# Patient Record
Sex: Male | Born: 1945 | Hispanic: Yes | Marital: Married | Smoking: Former smoker
Health system: Southern US, Community
[De-identification: ages and names within clinical notes are randomized; demographics above are authoritative.]

## PROBLEM LIST (undated history)

## (undated) DIAGNOSIS — M791 Myalgia, unspecified site: Secondary | ICD-10-CM

## (undated) DIAGNOSIS — U071 COVID-19: Secondary | ICD-10-CM

## (undated) DIAGNOSIS — I739 Peripheral vascular disease, unspecified: Secondary | ICD-10-CM

## (undated) DIAGNOSIS — R112 Nausea with vomiting, unspecified: Secondary | ICD-10-CM

## (undated) DIAGNOSIS — E782 Mixed hyperlipidemia: Secondary | ICD-10-CM

## (undated) DIAGNOSIS — D126 Benign neoplasm of colon, unspecified: Secondary | ICD-10-CM

## (undated) DIAGNOSIS — T7840XA Allergy, unspecified, initial encounter: Secondary | ICD-10-CM

## (undated) DIAGNOSIS — I10 Essential (primary) hypertension: Secondary | ICD-10-CM

## (undated) DIAGNOSIS — Z9289 Personal history of other medical treatment: Secondary | ICD-10-CM

## (undated) DIAGNOSIS — R252 Cramp and spasm: Secondary | ICD-10-CM

## (undated) DIAGNOSIS — M79669 Pain in unspecified lower leg: Secondary | ICD-10-CM

## (undated) DIAGNOSIS — R0600 Dyspnea, unspecified: Secondary | ICD-10-CM

## (undated) DIAGNOSIS — J189 Pneumonia, unspecified organism: Secondary | ICD-10-CM

## (undated) DIAGNOSIS — Z9889 Other specified postprocedural states: Secondary | ICD-10-CM

## (undated) DIAGNOSIS — J449 Chronic obstructive pulmonary disease, unspecified: Secondary | ICD-10-CM

## (undated) DIAGNOSIS — I2510 Atherosclerotic heart disease of native coronary artery without angina pectoris: Secondary | ICD-10-CM

## (undated) DIAGNOSIS — K648 Other hemorrhoids: Secondary | ICD-10-CM

## (undated) DIAGNOSIS — K5190 Ulcerative colitis, unspecified, without complications: Secondary | ICD-10-CM

## (undated) DIAGNOSIS — E876 Hypokalemia: Secondary | ICD-10-CM

## (undated) HISTORY — DX: Myalgia, unspecified site: M79.10

## (undated) HISTORY — DX: Allergy, unspecified, initial encounter: T78.40XA

## (undated) HISTORY — DX: Peripheral vascular disease, unspecified: I73.9

## (undated) HISTORY — DX: Hypokalemia: E87.6

## (undated) HISTORY — PX: MOHS SURGERY: SUR867

## (undated) HISTORY — DX: COVID-19: U07.1

## (undated) HISTORY — PX: OTHER SURGICAL HISTORY: SHX169

## (undated) HISTORY — DX: Ulcerative colitis, unspecified, without complications: K51.90

## (undated) HISTORY — DX: Other hemorrhoids: K64.8

## (undated) HISTORY — PX: POLYPECTOMY: SHX149

## (undated) HISTORY — DX: Chronic obstructive pulmonary disease, unspecified: J44.9

## (undated) HISTORY — DX: Pain in unspecified lower leg: M79.669

## (undated) HISTORY — DX: Cramp and spasm: R25.2

## (undated) HISTORY — PX: COLONOSCOPY: SHX5424

## (undated) HISTORY — PX: CATARACT EXTRACTION: SUR2

## (undated) HISTORY — DX: Benign neoplasm of colon, unspecified: D12.6

---
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
## 1986-02-10 HISTORY — PX: HEMORRHOID SURGERY: SHX153

## 2005-02-10 HISTORY — PX: ROTATOR CUFF REPAIR: SHX139

## 2005-12-29 ENCOUNTER — Ambulatory Visit: Payer: Self-pay | Admitting: Orthopaedic Surgery

## 2006-01-14 ENCOUNTER — Ambulatory Visit: Payer: Self-pay | Admitting: Orthopaedic Surgery

## 2006-08-19 ENCOUNTER — Ambulatory Visit: Payer: Self-pay | Admitting: Gastroenterology

## 2007-02-15 ENCOUNTER — Ambulatory Visit: Payer: Self-pay | Admitting: Vascular Surgery

## 2007-06-03 ENCOUNTER — Ambulatory Visit: Payer: Self-pay | Admitting: Family Medicine

## 2008-07-17 ENCOUNTER — Ambulatory Visit: Payer: Self-pay | Admitting: Family Medicine

## 2009-12-25 ENCOUNTER — Ambulatory Visit: Payer: Self-pay | Admitting: Gastroenterology

## 2011-10-20 ENCOUNTER — Ambulatory Visit: Payer: Self-pay | Admitting: Vascular Surgery

## 2011-10-20 LAB — CREATININE, SERUM
Creatinine: 0.86 mg/dL (ref 0.60–1.30)
EGFR (African American): >60
EGFR (Non-African Amer.): >60

## 2011-10-20 LAB — BUN: BUN: 10 mg/dL (ref 7–18)

## 2011-12-04 ENCOUNTER — Ambulatory Visit: Payer: Self-pay | Admitting: Family Medicine

## 2012-05-16 ENCOUNTER — Ambulatory Visit: Payer: Self-pay

## 2012-06-16 ENCOUNTER — Ambulatory Visit: Payer: Self-pay | Admitting: Vascular Surgery

## 2012-06-16 LAB — CREATININE, SERUM
Creatinine: 0.98 mg/dL (ref 0.60–1.30)
EGFR (African American): >60
EGFR (Non-African Amer.): >60

## 2012-06-16 LAB — BUN: BUN: 15 mg/dL (ref 7–18)

## 2012-12-23 ENCOUNTER — Encounter: Payer: Self-pay | Admitting: Podiatry

## 2012-12-29 ENCOUNTER — Ambulatory Visit (INDEPENDENT_AMBULATORY_CARE_PROVIDER_SITE_OTHER): Payer: No Typology Code available for payment source | Admitting: Podiatry

## 2012-12-29 ENCOUNTER — Encounter: Payer: Self-pay | Admitting: Podiatry

## 2012-12-29 VITALS — Ht 73.0 in | Wt 145.0 lb

## 2012-12-29 DIAGNOSIS — B351 Tinea unguium: Secondary | ICD-10-CM

## 2012-12-29 DIAGNOSIS — M79609 Pain in unspecified limb: Secondary | ICD-10-CM

## 2012-12-29 DIAGNOSIS — G8929 Other chronic pain: Secondary | ICD-10-CM

## 2012-12-29 NOTE — Progress Notes (Signed)
Antonio [name] presents today with a chief complaint of painful heel left. He states has been going on for quite some time.  Objective: Pulses remain barely palpable to this left lower leg. He had stents placed to the SFA which have failed. He has pain on palpation of this central plantar calcaneal tubercle of the left calcaneus. Severe fat-pad atrophy is resulting in bursitis been in pain about the plantar calcaneus.  Assessment: Bursitis plantar fasciitis left.  Plan: Dispensed silicone plantar fascial cuts.

## 2013-01-05 ENCOUNTER — Ambulatory Visit: Payer: Self-pay | Admitting: Family Medicine

## 2013-09-09 ENCOUNTER — Ambulatory Visit: Payer: Self-pay | Admitting: Gastroenterology

## 2013-10-01 ENCOUNTER — Emergency Department: Payer: Self-pay | Admitting: Emergency Medicine

## 2013-10-01 LAB — CBC
HCT: 48 % (ref 40.0–52.0)
HGB: 16.1 g/dL (ref 13.0–18.0)
MCH: 32 pg (ref 26.0–34.0)
MCHC: 33.6 g/dL (ref 32.0–36.0)
MCV: 95 fL (ref 80–100)
Platelet: 214 10*3/uL (ref 150–440)
RBC: 5.05 10*6/uL (ref 4.40–5.90)
RDW: 12.4 % (ref 11.5–14.5)
WBC: 11.6 10*3/uL — ABNORMAL HIGH (ref 3.8–10.6)

## 2013-10-01 LAB — BASIC METABOLIC PANEL
Anion Gap: 8 (ref 7–16)
BUN: 11 mg/dL (ref 7–18)
CALCIUM: 8.7 mg/dL (ref 8.5–10.1)
CHLORIDE: 107 mmol/L (ref 98–107)
CREATININE: 0.93 mg/dL (ref 0.60–1.30)
Co2: 23 mmol/L (ref 21–32)
Glucose: 118 mg/dL — ABNORMAL HIGH (ref 65–99)
OSMOLALITY: 276 (ref 275–301)
Potassium: 4.2 mmol/L (ref 3.5–5.1)
Sodium: 138 mmol/L (ref 136–145)

## 2013-10-01 LAB — TROPONIN I: Troponin-I: <0.02 ng/mL

## 2013-12-11 HISTORY — PX: MENISCUS REPAIR: SHX5179

## 2014-02-01 ENCOUNTER — Ambulatory Visit: Payer: Self-pay | Admitting: Family Medicine

## 2014-02-13 DIAGNOSIS — J168 Pneumonia due to other specified infectious organisms: Secondary | ICD-10-CM | POA: Diagnosis not present

## 2014-03-06 ENCOUNTER — Ambulatory Visit: Payer: Self-pay | Admitting: Family Medicine

## 2014-03-06 DIAGNOSIS — Z09 Encounter for follow-up examination after completed treatment for conditions other than malignant neoplasm: Secondary | ICD-10-CM | POA: Diagnosis not present

## 2014-03-06 DIAGNOSIS — R918 Other nonspecific abnormal finding of lung field: Secondary | ICD-10-CM | POA: Diagnosis not present

## 2014-03-06 DIAGNOSIS — J9811 Atelectasis: Secondary | ICD-10-CM | POA: Diagnosis not present

## 2014-03-06 DIAGNOSIS — J189 Pneumonia, unspecified organism: Secondary | ICD-10-CM | POA: Diagnosis not present

## 2014-03-30 DIAGNOSIS — K5190 Ulcerative colitis, unspecified, without complications: Secondary | ICD-10-CM | POA: Diagnosis not present

## 2014-04-17 DIAGNOSIS — D171 Benign lipomatous neoplasm of skin and subcutaneous tissue of trunk: Secondary | ICD-10-CM | POA: Diagnosis not present

## 2014-04-17 DIAGNOSIS — L218 Other seborrheic dermatitis: Secondary | ICD-10-CM | POA: Diagnosis not present

## 2014-04-17 DIAGNOSIS — Z1283 Encounter for screening for malignant neoplasm of skin: Secondary | ICD-10-CM | POA: Diagnosis not present

## 2014-04-21 DIAGNOSIS — I70211 Atherosclerosis of native arteries of extremities with intermittent claudication, right leg: Secondary | ICD-10-CM | POA: Diagnosis not present

## 2014-04-21 DIAGNOSIS — I6529 Occlusion and stenosis of unspecified carotid artery: Secondary | ICD-10-CM | POA: Diagnosis not present

## 2014-04-21 DIAGNOSIS — I739 Peripheral vascular disease, unspecified: Secondary | ICD-10-CM | POA: Diagnosis not present

## 2014-04-21 DIAGNOSIS — M7989 Other specified soft tissue disorders: Secondary | ICD-10-CM | POA: Diagnosis not present

## 2014-04-25 ENCOUNTER — Encounter: Payer: Self-pay | Admitting: Pulmonary Disease

## 2014-04-25 ENCOUNTER — Ambulatory Visit (INDEPENDENT_AMBULATORY_CARE_PROVIDER_SITE_OTHER): Payer: Medicare Other | Admitting: Pulmonary Disease

## 2014-04-25 VITALS — BP 124/66 | HR 78 | Ht 72.0 in | Wt 150.0 lb

## 2014-04-25 DIAGNOSIS — Z87891 Personal history of nicotine dependence: Secondary | ICD-10-CM | POA: Insufficient documentation

## 2014-04-25 DIAGNOSIS — J441 Chronic obstructive pulmonary disease with (acute) exacerbation: Secondary | ICD-10-CM | POA: Diagnosis not present

## 2014-04-25 DIAGNOSIS — Z720 Tobacco use: Secondary | ICD-10-CM | POA: Diagnosis not present

## 2014-04-25 DIAGNOSIS — J449 Chronic obstructive pulmonary disease, unspecified: Secondary | ICD-10-CM | POA: Diagnosis not present

## 2014-04-25 HISTORY — DX: Tobacco use: Z72.0

## 2014-04-25 MED ORDER — AEROCHAMBER MV MISC: Status: DC

## 2014-04-25 NOTE — Progress Notes (Signed)
Subjective:    Patient ID: Antonio [name], male    DOB: 1945-02-17, 69 y.o.   MRN: [id number]  HPI Chief Complaint  Patient presents with  . Advice Only    self-referral for copd.  Was dx'ed approx 3 yrs ago by pcp.       This is a very pleasant 69 year old male with acute in origin who grew up in [place] who comes to my clinic today or further evaluation of COPD. He is a self-referral. He says that he believes he was diagnosed with COPD approximately 1-1/2 years ago after he had a very severe episode of bronchitis. He was not hospitalized for this but it did require significant outpatient treatment. At that time he quit smoking after some having smoked at least 1/2-1 pack of cigarettes daily for 35 years. He notes that he also used to smoke cigars while playing golf on a regular basis but he does not do that routinely since his diagnosis of COPD. He has never had lung function testing.  He states that over the last several years he has noted increasing shortness of breath with heavy exertion such as walking 18 holes of cough. He normally notices the shortness of breath when he climbs a a steep hill. He says that routine activities such as going to the grocery store, carrying in groceries, or climbing one flight of stairs does not make him short of breath. He does not cough on a routine basis. He does not have significant mucus production.  He says that he uses Symbicort "when he feels he needs it" and albuterol anywhere between 0 and 3 times a day, depending on how he feels. He has noted over the last several years that he will sometimes become short of breath suddenly without any explanation previously. He says he has wondered if this is some sort of environmental allergen. He has not been able to pinpoint the cause. He denies concomitant symptoms such as itchy eyes, scratchy throat. He has never had seasonal allergic rhinitis to his knowledge.   Past Medical History  Diagnosis  Date  . Muscle pain   . Muscle cramp   . Calf pain   . COPD (chronic obstructive pulmonary disease)   . Ulcerative colitis      Family History  Problem Relation Age of Onset  . Cancer Mother     leukemia  . Heart disease Father      History   Social History  . Marital Status: Married    Spouse Name: N/A  . Number of Children: N/A  . Years of Education: N/A   Occupational History  . Not on file.   Social History Main Topics  . Smoking status: Former Smoker -- 0.75 packs/day for 35 years    Types: Cigarettes, Cigars    Quit date: 06/11/2011  . Smokeless tobacco: Never Used     Comment: off and on smoker   . Alcohol Use: 0.0 oz/week    0 Standard drinks or equivalent per week  . Drug Use: No  . Sexual Activity: Not on file   Other Topics Concern  . Not on file   Social History Narrative     Allergies  Allergen Reactions  . Penicillins Rash     Outpatient Prescriptions Prior to Visit  Medication Sig Dispense Refill  . clopidogrel (PLAVIX) 75 MG tablet Take 75 mg by mouth daily with breakfast.     No facility-administered medications prior to visit.  Review of Systems  Constitutional: Negative for fever and unexpected weight change.  HENT: Positive for congestion and sore throat. Negative for dental problem, ear pain, nosebleeds, postnasal drip, rhinorrhea, sinus pressure, sneezing and trouble swallowing.   Eyes: Negative for redness and itching.  Respiratory: Positive for cough and shortness of breath. Negative for chest tightness and wheezing.   Cardiovascular: Negative for palpitations and leg swelling.  Gastrointestinal: Negative for nausea and vomiting.  Genitourinary: Negative for dysuria.  Musculoskeletal: Negative for joint swelling.  Skin: Negative for rash.  Neurological: Negative for headaches.  Hematological: Does not bruise/bleed easily.  Psychiatric/Behavioral: Negative for dysphoric mood. The patient is not nervous/anxious.         Objective:   Physical Exam Filed Vitals:   04/25/14 0912  BP: 124/66  Pulse: 78  Height: 6' (1.829 m)  Weight: 150 lb (68.04 kg)  SpO2: 96%   Ambulated 500 feet on room air and his O2 saturation remained above 95%  Gen: well appearing, no acute distress HEENT: NCAT, PERRL, EOMi, OP clear, neck supple without masses PULM: Few coarse crackles throughout, good air movement, no wheezing CV: RRR, no mgr, no JVD AB: BS+, soft, nontender, no hsm Ext: warm, no edema, no clubbing, no cyanosis Derm: no rash or skin breakdown Neuro: A&Ox4, CN II-XII intact, strength 5/5 in all 4 extremities  04/2014 CXR images reviewed in clinic> emphysema note, no infiltrate, normal cardiac sillhouette      Assessment & Plan:   Centrilobular Emphysema COPD: I am not sure what Gold grade he is at this point because he has not had lung function testing. He clearly has emphysema on his chest x-ray and his symptoms are consistent with COPD but he needs to have a pulmonary function test in order to confirm this.  Because he notes intermittent increasing shortness of breath and chest tightness sometimes when he is outside I wonder if he does not have asthmatic features. We will check pulmonary function testing with pre-and postbronchodilator testing.   -O2 therapy: Not indicated -Immunizations: All UTD -Tobacco use: Quit 2014 -Exercise: Encouraged regular walking 25 min daily -Bronchodilator therapy: Educated on the importance of regular Symbicort use, provided spacer with technique and reviewed HFA technique at length -Exacerbation prevention: Use Symbicort regularly    Tobacco abuse Because of his extensive smoking history he is at increased risk for lung cancer. I congratulated him on quitting over a year and a half ago and I told him today that he really needs to make every effort to never return to tobacco use.  We discussed the risks and benefits of lung cancer screening and I advised him to go  home and learn more about it. On the next visit we will talk about lung cancer screening (high false positive rate, etc.) and we'll determine at that point if he is willing to proceed.    Updated Medication List Outpatient Encounter Prescriptions as of 04/25/2014  Medication Sig  . albuterol (PROVENTIL HFA;VENTOLIN HFA) 108 (90 BASE) MCG/ACT inhaler Inhale 2 puffs into the lungs every 6 (six) hours as needed for wheezing or shortness of breath.  . Ascorbic Acid (VITAMIN C) 100 MG tablet Take 100 mg by mouth daily.  . budesonide-formoterol (SYMBICORT) 80-4.5 MCG/ACT inhaler Inhale 2 puffs into the lungs 2 (two) times daily as needed.  . mesalamine (LIALDA) 1.2 G EC tablet Take 1.2 g by mouth 3 (three) times daily.  . Multiple Vitamin (MULTIVITAMIN WITH MINERALS) TABS tablet Take 1 tablet by mouth daily.  Marland Kitchen  POTASSIUM & SODIUM PHOSPHATES PO Take 1 tablet by mouth daily.  . vitamin E 400 UNIT capsule Take 400 Units by mouth daily.  Marland Kitchen Spacer/Aero-Holding Chambers (AEROCHAMBER MV) inhaler Use as instructed  . [DISCONTINUED] clopidogrel (PLAVIX) 75 MG tablet Take 75 mg by mouth daily with breakfast.

## 2014-04-25 NOTE — Assessment & Plan Note (Signed)
Because of his extensive smoking history he is at increased risk for lung cancer. I congratulated him on quitting over a year and a half ago and I told him today that he really needs to make every effort to never return to tobacco use.  We discussed the risks and benefits of lung cancer screening and I advised him to go home and learn more about it. On the next visit we will talk about lung cancer screening (high false positive rate, etc.) and we'll determine at that point if he is willing to proceed.

## 2014-04-25 NOTE — Patient Instructions (Signed)
Take Symbicort 2 puffs twice a day no matter how you feel, use it with a spacer Exercise regularly Think about Low Dose CT Lung Cancer Screening, we'll talk about this more on the next visit. We will see you back in 6-8 weeks.

## 2014-04-25 NOTE — Assessment & Plan Note (Signed)
COPD: I am not sure what Gold grade he is at this point because he has not had lung function testing. He clearly has emphysema on his chest x-ray and his symptoms are consistent with COPD but he needs to have a pulmonary function test in order to confirm this.  Because he notes intermittent increasing shortness of breath and chest tightness sometimes when he is outside I wonder if he does not have asthmatic features. We will check pulmonary function testing with pre-and postbronchodilator testing.   -O2 therapy: Not indicated -Immunizations: All UTD -Tobacco use: Quit 2014 -Exercise: Encouraged regular walking 25 min daily -Bronchodilator therapy: Educated on the importance of regular Symbicort use, provided spacer with technique and reviewed HFA technique at length -Exacerbation prevention: Use Symbicort regularly

## 2014-05-30 NOTE — Op Note (Signed)
PATIENT NAME:  Antonio [name], Antonio [name] MR#:  [id number] DATE OF BIRTH:  06-11-1945  DATE OF PROCEDURE:  10/20/2011  PREOPERATIVE DIAGNOSES:  1. Peripheral arterial disease with claudication, left lower extremity.  2. Right popliteal artery aneurysm.  3. Long-standing tobacco use.   POSTOPERATIVE DIAGNOSES:  1. Peripheral arterial disease with claudication, left lower extremity.  2. Right popliteal artery aneurysm.  3. Long-standing tobacco use.   PROCEDURES: 1. Ultrasound guidance for vascular access, right femoral artery.  2. Catheter placement into left below-knee popliteal artery from right femoral approach.  3. Aortogram with selective left lower extremity angiogram.  4. Percutaneous transluminal angioplasty of long segment SFA and popliteal occlusion with 5 and 6 mm diameter angioplasty balloon.  5. Self-expanding stent placement x2 to the above-knee popliteal artery and superficial femoral artery for greater than 50% residual stenosis after angioplasty.  6. Mechanical rheolytic thrombectomy to the proximal SFA for residual thrombosis after previous interventions.  7. Percutaneous transluminal angioplasty with 7 mm diameter angioplasty balloon to the proximal SFA.   SURGEON: Algernon Huxley, MD  ANESTHESIA: Local with moderate conscious sedation.   ESTIMATED BLOOD LOSS: Approximately 25 mL.   FLUOROSCOPY TIME: 12 minutes.   CONTRAST USED: 78 mL.   INDICATION FOR PROCEDURE: This is a [place] gentleman with long-standing known peripheral arterial disease. He has had worsening of his symptoms over the past several months and has developed pain and very short distance of walking and even some pain at night worrisome for ischemic rest pain. For these reasons we discussed options. He now desired intervention to the severe lifestyle limiting nature of his symptoms. Risks and benefits were discussed and he elected to proceed.   DESCRIPTION OF PROCEDURE: The patient is brought to the vascular  interventional radiology suite. Groins shaved and prepped, sterile surgical field was created. Ultrasound was used to access the right femoral artery. This was done under direct ultrasound guidance without difficulty with a Seldinger needle and a J-wire and 5 French sheath were placed. Pigtail catheter was placed in the aorta. AP aortogram is performed. This showed no flow-limiting stenosis in the aortoiliac segments with good renal artery perfusion bilaterally. I then hooked the aortic bifurcation, advanced to the left femoral head and selective left lower extremity angiogram was then performed. This demonstrated a proximal occlusion at the SFA just at its origin. This reconstituted just above the knee in the popliteal artery. The patient was systemically heparinized. A 6 French Ansel sheath was placed over a Terumo advantage wire. I was able to gain access to the occluded superficial femoral artery with minimal difficulty with a Kumpe catheter and a Terumo advantage wire. I was able to cross the occlusion and gain intraluminal flow and confirm this in the below-knee popliteal artery and there was good runoff distally with three vessels. I then replaced the Terumo advantage wire, used a 5 mm diameter angioplasty balloon and angioplastied from just above the knee to the common femoral artery. There is still residual stenosis and occlusion, particularly proximally and I used a 6 mm diameter angioplasty balloon from the same locations. Following this there was still areas of essentially no flow throughout. I placed a self-expanding stent using a 6 mm diameter x 20 cm length self-expanding stent starting just above the knee and then a 7 mm diameter x 27 cm length self-expanding stent that went to the proximal superficial femoral artery approximally 4 to 5 cm below the origin. Following this there was still what appeared to be some  thrombus in the proximal SFA. I used mechanical rheolytic thrombectomy to treat this with  significant improvement in flow through the SFA and the stents and then ballooned this area with a 7 mm diameter angioplasty balloon. There was still some residual stenosis and mild thrombus at the top of the stent within the proximal SFA. This was in a zone that was not safe for placement of the stent for fear of hurting his profunda femoris artery and for this reason, I elected to terminate the procedure and run him on Integrilin overnight to try to maintain patency of the intervention. Sheath was pulled back to the ipsilateral external iliac artery and oblique arteriogram was performed. StarClose closure device was performed with excellent hemostatic result.    The patient tolerated procedure well and was taken to the recovery room in stable condition and he had a palpable pulse in the dorsalis pedis in the foot.  ____________________________ Algernon Huxley, MD jsd:cms D: 10/20/2011 14:22:19 ET T: 10/20/2011 16:12:57 ET JOB#: [id number]  cc: Algernon Huxley, MD, <Dictator>  Algernon Huxley MD ELECTRONICALLY SIGNED 10/27/2011 8:59

## 2014-05-30 NOTE — Discharge Summary (Signed)
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   PROCEDURES PERFORMED DURING THIS ADMISSION:  1. Aortogram with selective left lower extremity angiogram.  2. Percutaneous transluminal angioplasty of the long segment SFA popliteal occlusion with a 5 and 6 mm diameter angioplasty balloon.  3. Self-expanding stent placement x2 to the above-knee popliteal artery and superficial femoral artery for greater than 50% stenosis after angioplasty.  4. Mechanical rheolytic thrombectomy to the proximal SFA for residual thrombosis after previous interventions.  5. Percutaneous transluminal angioplasty with a 7 mm diameter angioplasty balloon to the proximal SFA done by Dr. Leotis Pain on 10/20/2011.   COMPLICATIONS: Extreme pain after procedure.   CONSULTATIONS: None.   HOSPITAL COURSE: Patient was admitted. He underwent his procedure on 74/25/9563 without complications that were major in character. Following his angiogram he was transferred from the postanesthesia care unit to the telemetry floor where vital signs remained stable. He received preoperative and postoperative IV antibiotics as well as IV Integrilin, and required no transfusion during this admission. His right groin access remained clean, dry, and intact. He was distally and neurovascularly intact as well. He is stable for discharge on 10/21/2011.   DISCHARGE MEDICATIONS: Discharge medications are his home medications.   DISPOSITION: The patient is discharged home in stable condition on 10/21/2011.   DISCHARGE INSTRUCTIONS: Will continue weight-bearing to tolerance on his left lower extremity. Patient does have a follow-up appointment with our office in 3 to 4 weeks for a wound check as well as ABIs.   Patient understands to call with any questions or concerns in the  interim.   ____________________________ Lane Hacker, PA-C jmk:cms D: 10/21/2011 07:31:48 ET T: 10/21/2011 14:09:17 ET JOB#: [id number]  cc: Lane Hacker, PA-C, <Dictator>  Lane Hacker PA ELECTRONICALLY SIGNED 10/23/2011 8:35

## 2014-06-02 NOTE — Op Note (Signed)
PATIENT NAME:  Antonio [name], Antonio [name] MR#:  [id number] DATE OF BIRTH:  14-Sep-1945  DATE OF PROCEDURE:  06/16/2012  PREOPERATIVE DIAGNOSES:  1. Peripheral arterial disease with claudication, left lower extremity.  2. Status post previous revascularization with occluded left superficial femoral artery stent.  3. Tobacco dependence.   POSTOPERATIVE DIAGNOSES:  1. Peripheral arterial disease with claudication, left lower extremity.  2. Status post previous revascularization with occluded left superficial femoral artery stent.  3. Tobacco dependence.   PROCEDURE: 1. Catheter placement into left popliteal artery from right femoral approach.  2. Aortogram and selective left lower extremity angiogram.  3. Percutaneous transluminal angioplasty of entire superficial femoral artery and above-knee popliteal artery, 4 and 6 mm diameter angioplasty balloon.  4. Viabahn stent placement to left superficial femoral artery for greater than 50% residual stenosis and dissection after angioplasty and previous stent fracture.  5. StarClose closure device, right femoral artery.   SURGEON: Algernon Huxley, MD  ANESTHESIA: Local with moderate conscious sedation.   ESTIMATED BLOOD LOSS: Minimal.   FLUOROSCOPY TIME: 19 minutes.   CONTRAST USED: 67 mL.   INDICATION FOR PROCEDURE: A 69 year old male with severe peripheral vascular disease. He has claudication of the left lower extremity which has become disabling. He began to have numbness and tingling in his foot worrisome for ischemic rest pain symptoms. He is brought back for an angiogram with possible revascularization. Risks and benefits were discussed. Informed consent was obtained.   DESCRIPTION OF THE PROCEDURE: The patient is brought to the vascular and interventional radiology suite. Groins were shaved and prepped, and a sterile surgical field was created. The right femoral head was localized with fluoroscopy, and the right femoral artery was accessed without  difficulty with a Seldinger needle. J-wire and 5 French sheath were placed. Pigtail catheter was placed in the aorta at the L1-L2 level. AP aortogram was performed. This showed normal renal and mesenteric vessels with normal aorta and iliac vessels without flow-limiting stenosis.  I then hooked the aortic bifurcation and advance to the left femoral head. Selective left lower extremity angiogram was then performed. This showed a near-flush occlusion of the left SFA. This was several centimeters above the previously placed stent. The previously placed stent had a clear stent fracture about 45 cm into its stent and may be the cause of the recurrent occlusion. He then reconstituted the popliteal artery at the level of the knee just below the previously placed stents. He had 2 long stents placed previously. The patient was systemically heparinized with 5000 units of intravenous heparin. A 6 French Ansel sheath was placed over a Terumo Advantage wire. With the Terumo Advantage wire, a Kumpe and then a CXI catheter, I was able to gain access to the SFA, get in and cross the occluded stents and into the native popliteal artery distally, confirming intraluminal flow. This was very tight with significant stored energy, and a smaller balloon was used to initially angioplasty the area of recurrent occlusions. A 4 mm diameter balloon was used, and then a 6 mm balloon was taken from just above the knee up to the origin of the SFA. There were still several areas within the proximal portion of the previously placed stent with greater than 50% residual stenosis in the stent fracture, and I elected to cover this area with a Viabahn stent. We exchanged for an 0.018 wire, and a 6 mm diameter x 25 cm in length Viabahn stent was used throughout much of the SFA. This still began  several centimeters below the origin of the SFA, not to encroach upon the profunda femoris artery. Completion angiogram showed markedly improved flow with  maintained distal runoff with good tibial vessels. At this point, I elected to terminate the procedure. The sheath was removed, and a StarClose closure device was deployed in the usual fashion with excellent hemostatic result. The patient tolerated the procedure well and was taken to the recovery room in stable condition.    ____________________________ Algernon Huxley, MD jsd:OSi D: 06/16/2012 12:16:04 ET T: 06/16/2012 12:34:14 ET JOB#: [id number]  cc: Algernon Huxley, MD, <Dictator> Algernon Huxley MD ELECTRONICALLY SIGNED 06/28/2012 13:48

## 2014-06-07 ENCOUNTER — Other Ambulatory Visit: Payer: Self-pay | Admitting: Pulmonary Disease

## 2014-06-07 ENCOUNTER — Ambulatory Visit (INDEPENDENT_AMBULATORY_CARE_PROVIDER_SITE_OTHER): Payer: Medicare Other | Admitting: Pulmonary Disease

## 2014-06-07 DIAGNOSIS — R0600 Dyspnea, unspecified: Secondary | ICD-10-CM

## 2014-06-07 DIAGNOSIS — J441 Chronic obstructive pulmonary disease with (acute) exacerbation: Secondary | ICD-10-CM | POA: Diagnosis not present

## 2014-06-07 LAB — PULMONARY FUNCTION TEST
DL/VA % pred: 55 %
DL/VA: 2.64 ml/min/mmHg/L
DLCO unc % pred: 44 %
DLCO unc: 16.26 ml/min/mmHg
FEF 25-75 Post: 0.69 L/sec
FEF 25-75 Pre: 0.64 L/sec
FEF2575-%Change-Post: 8 %
FEF2575-%Pred-Post: 25 %
FEF2575-%Pred-Pre: 23 %
FEV1-%Change-Post: 1 %
FEV1-%Pred-Post: 40 %
FEV1-%Pred-Pre: 40 %
FEV1-Post: 1.47 L
FEV1-Pre: 1.46 L
FEV1FVC-%Change-Post: -2 %
FEV1FVC-%Pred-Pre: 71 %
FEV6-%Change-Post: 1 %
FEV6-%Pred-Post: 60 %
FEV6-%Pred-Pre: 59 %
FEV6-Post: 2.82 L
FEV6-Pre: 2.77 L
FEV6FVC-%Change-Post: -1 %
FEV6FVC-%Pred-Post: 102 %
FEV6FVC-%Pred-Pre: 103 %
FVC-%Change-Post: 3 %
FVC-%Pred-Post: 59 %
FVC-%Pred-Pre: 57 %
FVC-Post: 2.9 L
FVC-Pre: 2.8 L
Post FEV1/FVC ratio: 51 %
Post FEV6/FVC ratio: 97 %
Pre FEV1/FVC ratio: 52 %
Pre FEV6/FVC Ratio: 99 %

## 2014-06-07 NOTE — Progress Notes (Signed)
PFT performed today. Nitrogen washout performed.

## 2014-06-09 ENCOUNTER — Encounter (INDEPENDENT_AMBULATORY_CARE_PROVIDER_SITE_OTHER): Payer: Self-pay

## 2014-06-09 ENCOUNTER — Encounter: Payer: Self-pay | Admitting: Pulmonary Disease

## 2014-06-09 ENCOUNTER — Ambulatory Visit (INDEPENDENT_AMBULATORY_CARE_PROVIDER_SITE_OTHER): Payer: Medicare Other | Admitting: Pulmonary Disease

## 2014-06-09 VITALS — BP 124/66 | HR 78 | Ht 72.0 in | Wt 148.0 lb

## 2014-06-09 DIAGNOSIS — R197 Diarrhea, unspecified: Secondary | ICD-10-CM | POA: Insufficient documentation

## 2014-06-09 DIAGNOSIS — J449 Chronic obstructive pulmonary disease, unspecified: Secondary | ICD-10-CM | POA: Diagnosis not present

## 2014-06-09 DIAGNOSIS — Z720 Tobacco use: Secondary | ICD-10-CM | POA: Diagnosis not present

## 2014-06-09 MED ORDER — BUDESONIDE-FORMOTEROL FUMARATE 80-4.5 MCG/ACT IN AERO
2.0000 | INHALATION_SPRAY | Freq: Two times a day (BID) | RESPIRATORY_TRACT | Status: DC | PRN
Start: 2014-06-09 — End: 2014-11-08

## 2014-06-09 NOTE — Assessment & Plan Note (Signed)
He says he has been diagnosed with colitis, but he is not certain exactly what kind. He has been struggling with worse diarrhea recently. He would like to be referred to a gastroenterologist. I will refer him to Dr. Zenovia Jarred with [facility] gastroenterology.

## 2014-06-09 NOTE — Progress Notes (Signed)
Subjective:    Patient ID: Antonio [name], male    DOB: [birth year], 69 y.o.   MRN: [id number]  Synopsis: He was referred for shortness of breath in 2016. He had a lengthy smoking history but quit smoking in 2014. April 2016 pulmonary function testing confirmed COPD which was severe with an FEV1 of 1.47 L (40% predicted).  HPI Chief Complaint  Patient presents with  . Follow-up    Pt states symbicort has helped.  c/o sob with lots of exertion.  Review PFT from 4/27.   Antonio [name] says that his symbicort is helping because he is not relying on the ventolin nearly as much anymore.  He continues to remain active with his golf on a regular basis. He is not coughing.  Diarrhea has been more of a problem for him recently. He is having to go to the bathroom 3-4 times a day with loose stools. He says he has been diagnosed with "colitis" in the past but is not certain what kind. He would like to be referred to a gastroenterologist.  Past Medical History  Diagnosis Date  . Muscle pain   . Muscle cramp   . Calf pain   . COPD (chronic obstructive pulmonary disease)   . Ulcerative colitis       Review of Systems     Objective:   Physical Exam  Filed Vitals:   06/09/14 1402  BP: 124/66  Pulse: 78  Height: 6' (1.829 m)  Weight: 148 lb (67.132 kg)  SpO2: 95%   RA  Gen: well appearing HENT: OP clear, TM's clear, neck supple PULM: CTA B, normal effort CV: RRR, no mgr, trace edema GI: BS+, soft, nontender Derm: no cyanosis or rash Psyche: normal mood and affect  PFTs from this week reviewed showing severe airflow obstruction     Assessment & Plan:   Centrilobular Emphysema His lung function testing this week showed severe COPD. Despite this he remains quite active with playing golf on a regular basis. He is a Gold grade C.  He has benefited from the Symbicort significantly and is using much less Ventolin.  Plan: -Continue Symbicort indefinitely -Continue as needed  albuterol -Flu shot in the fall -Follow-up 6 months   Tobacco abuse Today we discussed his high risk of lung cancer given his prior smoking history. We discussed the risks and benefits of lung cancer screening with a CT scan, he is interested but wants to think about it more before committing. We provided him with literature on lung cancer screening today.   Diarrhea He says he has been diagnosed with colitis, but he is not certain exactly what kind. He has been struggling with worse diarrhea recently. He would like to be referred to a gastroenterologist. I will refer him to Dr. Zenovia Jarred with [facility] gastroenterology.     Updated Medication List Outpatient Encounter Prescriptions as of 06/09/2014  Medication Sig  . albuterol (PROVENTIL HFA;VENTOLIN HFA) 108 (90 BASE) MCG/ACT inhaler Inhale 2 puffs into the lungs every 6 (six) hours as needed for wheezing or shortness of breath.  . Ascorbic Acid (VITAMIN C) 100 MG tablet Take 100 mg by mouth daily.  . budesonide-formoterol (SYMBICORT) 80-4.5 MCG/ACT inhaler Inhale 2 puffs into the lungs 2 (two) times daily as needed.  . mesalamine (LIALDA) 1.2 G EC tablet Take 1.2 g by mouth 3 (three) times daily.  . Multiple Vitamin (MULTIVITAMIN WITH MINERALS) TABS tablet Take 1 tablet by mouth daily.  Marland Kitchen POTASSIUM & SODIUM PHOSPHATES  PO Take 1 tablet by mouth daily.  Marland Kitchen Spacer/Aero-Holding Chambers (AEROCHAMBER MV) inhaler Use as instructed  . SYMBICORT 160-4.5 MCG/ACT inhaler Inhale 1 puff into the lungs daily.  . vitamin E 400 UNIT capsule Take 400 Units by mouth daily.

## 2014-06-09 NOTE — Assessment & Plan Note (Signed)
Today we discussed his high risk of lung cancer given his prior smoking history. We discussed the risks and benefits of lung cancer screening with a CT scan, he is interested but wants to think about it more before committing. We provided him with literature on lung cancer screening today.

## 2014-06-09 NOTE — Assessment & Plan Note (Signed)
His lung function testing this week showed severe COPD. Despite this he remains quite active with playing golf on a regular basis. He is a Gold grade C.  He has benefited from the Symbicort significantly and is using much less Ventolin.  Plan: -Continue Symbicort indefinitely -Continue as needed albuterol -Flu shot in the fall -Follow-up 6 months

## 2014-06-09 NOTE — Patient Instructions (Signed)
We will refer you to Dr. Hilarie Fredrickson with [facility] gastroenterology to evaluate the diarrhea Take Symbicort 2 puffs twice a day to matter how you feel Let me know if you are interested in lung cancer screening Follow-up in 6 months or sooner if needed

## 2014-06-21 ENCOUNTER — Encounter: Payer: Self-pay | Admitting: Internal Medicine

## 2014-07-13 ENCOUNTER — Encounter: Payer: Self-pay | Admitting: Family Medicine

## 2014-07-13 ENCOUNTER — Ambulatory Visit (INDEPENDENT_AMBULATORY_CARE_PROVIDER_SITE_OTHER): Payer: Medicare Other | Admitting: Family Medicine

## 2014-07-13 VITALS — BP 120/78 | HR 80 | Ht 72.0 in | Wt 147.0 lb

## 2014-07-13 DIAGNOSIS — J441 Chronic obstructive pulmonary disease with (acute) exacerbation: Secondary | ICD-10-CM | POA: Diagnosis not present

## 2014-07-13 MED ORDER — PREDNISONE 10 MG PO TABS: 10.0000 mg | ORAL_TABLET | Freq: Every day | ORAL | Status: DC

## 2014-07-13 MED ORDER — GUAIFENESIN-CODEINE 100-10 MG/5ML PO SOLN: 5.0000 mL | Freq: Four times a day (QID) | ORAL | Status: AC | PRN

## 2014-07-13 NOTE — Progress Notes (Signed)
Name: Antonio [name]   MRN: [id number]    DOB: 11/03/1945   Date:07/13/2014       Progress Note  Subjective  Chief Complaint  Chief Complaint  Patient presents with  . Cough    drainage- doesn't bother at night, just during day    Cough This is a recurrent problem. The current episode started in the past 7 days. The problem has been unchanged. The problem occurs constantly. The cough is non-productive. Associated symptoms include nasal congestion, postnasal drip and wheezing. Pertinent negatives include no chills, ear pain, fever, headaches, heartburn, myalgias, sore throat or sweats. The symptoms are aggravated by pollens. Risk factors for lung disease include smoking/tobacco exposure. He has tried steroid inhaler and a beta-agonist inhaler for the symptoms. The treatment provided mild relief. His past medical history is significant for COPD and emphysema.     No problem-specific assessment & plan notes found for this encounter.   Past Medical History  Diagnosis Date  . Muscle pain   . Muscle cramp   . Calf pain   . COPD (chronic obstructive pulmonary disease)   . Ulcerative colitis     Past Surgical History  Procedure Laterality Date  . Meniscus repair Left 12/2013  . Leg stent Left approx 45 yrs ago  . Shoulder surgery Left 2007  . Hemorrhoid surgery  1988  . Tonsillectomy  1957    Family History  Problem Relation Age of Onset  . Cancer Mother     leukemia  . Heart disease Father     History   Social History  . Marital Status: Married    Spouse Name: N/A  . Number of Children: N/A  . Years of Education: N/A   Occupational History  . Not on file.   Social History Main Topics  . Smoking status: Former Smoker -- 0.75 packs/day for 35 years    Types: Cigarettes, Cigars    Quit date: 06/11/2011  . Smokeless tobacco: Never Used     Comment: off and on smoker   . Alcohol Use: 0.0 oz/week    0 Standard drinks or equivalent per week  . Drug Use: No  .  Sexual Activity: Not on file   Other Topics Concern  . Not on file   Social History Narrative     Current outpatient prescriptions:  .  albuterol (PROVENTIL HFA;VENTOLIN HFA) 108 (90 BASE) MCG/ACT inhaler, Inhale 2 puffs into the lungs every 6 (six) hours as needed for wheezing or shortness of breath., Disp: , Rfl:  .  Ascorbic Acid (VITAMIN C) 100 MG tablet, Take 100 mg by mouth daily., Disp: , Rfl:  .  aspirin 81 MG tablet, Take 81 mg by mouth daily., Disp: , Rfl:  .  fexofenadine (ALLEGRA) 180 MG tablet, Take 180 mg by mouth daily., Disp: , Rfl:  .  mesalamine (LIALDA) 1.2 G EC tablet, Take 1.2 g by mouth 3 (three) times daily., Disp: , Rfl:  .  Multiple Vitamin (MULTIVITAMIN WITH MINERALS) TABS tablet, Take 1 tablet by mouth daily., Disp: , Rfl:  .  POTASSIUM & SODIUM PHOSPHATES PO, Take 1 tablet by mouth daily., Disp: , Rfl:  .  Spacer/Aero-Holding Chambers (AEROCHAMBER MV) inhaler, Use as instructed, Disp: 1 each, Rfl: 0 .  SYMBICORT 160-4.5 MCG/ACT inhaler, Inhale 1 puff into the lungs daily., Disp: , Rfl: 11 .  vitamin E 400 UNIT capsule, Take 400 Units by mouth daily., Disp: , Rfl:  .  budesonide-formoterol (SYMBICORT) 80-4.5 MCG/ACT inhaler,  Inhale 2 puffs into the lungs 2 (two) times daily as needed. (Patient not taking: Reported on 07/13/2014), Disp: 3 Inhaler, Rfl: 3 .  guaiFENesin-codeine 100-10 MG/5ML syrup, Take 5 mLs by mouth every 6 (six) hours as needed for cough., Disp: 120 mL, Rfl: 0 .  predniSONE (DELTASONE) 10 MG tablet, Take 1 tablet (10 mg total) by mouth daily with breakfast., Disp: 30 tablet, Rfl: 0  Allergies  Allergen Reactions  . Penicillins Rash     Review of Systems  Constitutional: Negative for fever, chills and diaphoresis.  HENT: Positive for postnasal drip. Negative for ear pain and sore throat.   Respiratory: Positive for cough and wheezing.   Cardiovascular: Negative for palpitations.  Gastrointestinal: Positive for diarrhea. Negative for  heartburn, blood in stool and melena.  Genitourinary: Negative for dysuria and frequency.  Musculoskeletal: Negative for myalgias.  Neurological: Negative for headaches.  Psychiatric/Behavioral: Negative for depression.     Objective  Filed Vitals:   07/13/14 1118  BP: 120/78  Pulse: 80  Height: 6' (1.829 m)  Weight: 147 lb (66.679 kg)  SpO2: 97%    Physical Exam  Constitutional: He is well-developed, well-nourished, and in no distress.  HENT:  Head: Normocephalic.  Right Ear: External ear normal.  Left Ear: External ear normal.  Nose: Nose normal.  Mouth/Throat: Oropharynx is clear and moist.  Eyes: Conjunctivae are normal. Pupils are equal, round, and reactive to light.  Neck: Normal range of motion. Neck supple. No JVD present. No tracheal deviation present. No thyromegaly present.  Pulmonary/Chest: Effort normal. No respiratory distress. He has no wheezes. He has no rales.  Abdominal: Soft. Bowel sounds are normal. There is no tenderness.  Musculoskeletal: Normal range of motion.  Lymphadenopathy:    He has no cervical adenopathy.      Recent Results (from the past 2160 hour(s))  Pulmonary function test     Status: None   Collection Time: 06/07/14  2:58 PM  Result Value Ref Range   FVC-Pre 2.80 L   FVC-%Pred-Pre 57 %   FVC-Post 2.90 L   FVC-%Pred-Post 59 %   FVC-%Change-Post 3 %   FEV1-Pre 1.46 L   FEV1-%Pred-Pre 40 %   FEV1-Post 1.47 L   FEV1-%Pred-Post 40 %   FEV1-%Change-Post 1 %   FEV6-Pre 2.77 L   FEV6-%Pred-Pre 59 %   FEV6-Post 2.82 L   FEV6-%Pred-Post 60 %   FEV6-%Change-Post 1 %   Pre FEV1/FVC ratio 52 %   FEV1FVC-%Pred-Pre 71 %   Post FEV1/FVC ratio 51 %   FEV1FVC-%Change-Post -2 %   Pre FEV6/FVC Ratio 99 %   FEV6FVC-%Pred-Pre 103 %   Post FEV6/FVC ratio 97 %   FEV6FVC-%Pred-Post 102 %   FEV6FVC-%Change-Post -1 %   FEF 25-75 Pre 0.64 L/sec   FEF2575-%Pred-Pre 23 %   FEF 25-75 Post 0.69 L/sec   FEF2575-%Pred-Post 25 %    FEF2575-%Change-Post 8 %   DLCO unc 16.26 ml/min/mmHg   DLCO unc % pred 44 %   DL/VA 2.64 ml/min/mmHg/L   DL/VA % pred 55 %     Assessment & Plan  Problem List Items Addressed This Visit    None    Visit Diagnoses    COPD with exacerbation    -  Primary    Relevant Medications    fexofenadine (ALLEGRA) 180 MG tablet    guaiFENesin-codeine 100-10 MG/5ML syrup    predniSONE (DELTASONE) 10 MG tablet       Meds ordered this encounter  Medications  .  aspirin 81 MG tablet    Sig: Take 81 mg by mouth daily.  . fexofenadine (ALLEGRA) 180 MG tablet    Sig: Take 180 mg by mouth daily.  Marland Kitchen guaiFENesin-codeine 100-10 MG/5ML syrup    Sig: Take 5 mLs by mouth every 6 (six) hours as needed for cough.    Dispense:  120 mL    Refill:  0  . predniSONE (DELTASONE) 10 MG tablet    Sig: Take 1 tablet (10 mg total) by mouth daily with breakfast.    Dispense:  30 tablet    Refill:  0     Dr. Macon [facility]  07/13/2014

## 2014-08-18 ENCOUNTER — Ambulatory Visit (INDEPENDENT_AMBULATORY_CARE_PROVIDER_SITE_OTHER): Payer: Medicare Other | Admitting: Internal Medicine

## 2014-08-18 ENCOUNTER — Encounter: Payer: Self-pay | Admitting: Internal Medicine

## 2014-08-18 VITALS — BP 110/60 | HR 60 | Ht 70.0 in | Wt 149.5 lb

## 2014-08-18 DIAGNOSIS — K5120 Ulcerative (chronic) proctitis without complications: Secondary | ICD-10-CM

## 2014-08-18 DIAGNOSIS — D126 Benign neoplasm of colon, unspecified: Secondary | ICD-10-CM

## 2014-08-18 MED ORDER — MESALAMINE 4 G RE ENEM: 4.0000 g | ENEMA | Freq: Every day | RECTAL | Status: DC

## 2014-08-18 MED ORDER — MESALAMINE 1.2 G PO TBEC: 3.6000 g | DELAYED_RELEASE_TABLET | Freq: Every day | ORAL | Status: DC

## 2014-08-18 NOTE — Patient Instructions (Signed)
We have printed your prescriptions as requested and give them to you   Lialda and Rowasa Your colonoscopy recall will be 08/2016, You will receive a letter from [place]  Follow up in 8 weeks which is scheduled on 09/12/2014 Tuesday at 2pm

## 2014-08-18 NOTE — Progress Notes (Signed)
Spoke with pt and reviewed Dr. Garth Schlatter comments with him. Pt aware.   Please let pt know that I reviewed his records more extensively.    The visible inflammation at colonoscopy was rectal only. However, the polypectomies from the transverse and ascending colon also showed inflammation (even though it was not seen by the eye).    This suggests the colitis involves more than just the rectum.    No change in therapy, continue Lialda and Rowasa (the rowasa was added today)

## 2014-08-18 NOTE — Progress Notes (Signed)
Patient ID: Antonio [name], male   DOB: [birth year], 69 y.o.   MRN: [id number] HPI: Antonio [name] is a 69 yo male with PMH of ulcerative proctitis, adenomatous colon polyps, COPD who seen in evaluation for second opinion regarding ulcerative proctitis. He is here alone today. He was diagnosed with ulcerative proctitis 1 year ago by Dr. Lucilla [name].  He reports he developed loose or more frequent stools which led to further evaluation. He reports for his whole life he was regular having one formed bowel movement a day. This has changed since diagnosis and he has developed more frequent, urgent stools occurring 2-4 times daily. Often worse after eating. Now afraid to go play golf on some days. Monitor blood with wiping but denies blood in the stool or melena. Denies tenesmus. Denies incomplete evacuation. Denies abdominal pain. Does report increased flatulence. Denies upper GI complaint including nausea, vomiting, dysphagia, odynophagia, heartburn, indigestion, early satiety. Reports stable weight. Has family history of GI tract malignancy or IBD. Denies oral ulcers, rash or new joint pains.  Is taking Lialda 3 tablets daily. Reports he was initially treated with Lialda 2 tablets per day which didn't help much. He was put on a prednisone taper for 1 month and this seemed to help. Lialda was then increase to 3 tablets daily and at some point Canasa suppositories were tried. He can't recall whether the increased Lialda or Canasa helped his symptoms. He denies frequent NSAID use.  Records from Dr. Allen [name] proceed in reviewed. Colonoscopy 09/09/2013 -- severe proctitis in the rectum and distal rectum. Biopsies obtained. 3 mm cecal polyp removed. 3 sessile polyps in the ascending colon ranging 4-6 mm in size removed. 4 mm transverse colon polyp removed. Pathology = rectal biopsies -- marked active colitis with architectural features of chronicity. Negative for dysplasia. Ascending colon polyps -- tubular adenoma 2  and polypoid fragment of colonic mucosa with moderate active colitis. Cecal polyp -- early hyperplastic polyp. Transverse colon polyp -- moderate active colitis.  Past Medical History  Diagnosis Date  . Muscle pain   . Muscle cramp   . Calf pain   . COPD (chronic obstructive pulmonary disease)   . Ulcerative colitis   . Colon polyps     Past Surgical History  Procedure Laterality Date  . Meniscus repair Left 12/2013  . Leg stent Left approx 45 yrs ago  . Rotator cuff repair Left 2007  . Hemorrhoid surgery  1988  . Tonsillectomy and adenoidectomy  1957    Outpatient Prescriptions Prior to Visit  Medication Sig Dispense Refill  . albuterol (PROVENTIL HFA;VENTOLIN HFA) 108 (90 BASE) MCG/ACT inhaler Inhale 2 puffs into the lungs every 6 (six) hours as needed for wheezing or shortness of breath.    . Ascorbic Acid (VITAMIN C) 100 MG tablet Take 100 mg by mouth daily.    Marland Kitchen aspirin 81 MG tablet Take 81 mg by mouth daily.    . budesonide-formoterol (SYMBICORT) 80-4.5 MCG/ACT inhaler Inhale 2 puffs into the lungs 2 (two) times daily as needed. 3 Inhaler 3  . fexofenadine (ALLEGRA) 180 MG tablet Take 180 mg by mouth daily.    . Multiple Vitamin (MULTIVITAMIN WITH MINERALS) TABS tablet Take 1 tablet by mouth daily.    Marland Kitchen POTASSIUM & SODIUM PHOSPHATES PO Take 1 tablet by mouth daily.    Marland Kitchen Spacer/Aero-Holding Chambers (AEROCHAMBER MV) inhaler Use as instructed 1 each 0  . SYMBICORT 160-4.5 MCG/ACT inhaler Inhale 1 puff into the lungs daily.  11  .  vitamin E 400 UNIT capsule Take 400 Units by mouth daily.    . mesalamine (LIALDA) 1.2 G EC tablet Take 1.2 g by mouth 3 (three) times daily.    . predniSONE (DELTASONE) 10 MG tablet Take 1 tablet (10 mg total) by mouth daily with breakfast. 30 tablet 0   No facility-administered medications prior to visit.    Allergies  Allergen Reactions  . Penicillins Rash    Family History  Problem Relation Age of Onset  . Cancer Mother     leukemia  .  Heart disease Father     History  Substance Use Topics  . Smoking status: Former Smoker -- 0.75 packs/day for 35 years    Types: Cigarettes, Cigars    Quit date: 06/11/2011  . Smokeless tobacco: Never Used     Comment: off and on smoker   . Alcohol Use: 4.2 oz/week    7 Standard drinks or equivalent per week    ROS: As per history of present illness, otherwise negative  BP 110/60 mmHg  Pulse 60  Ht 5' 10"  (1.778 m)  Wt 149 lb 8 oz (67.813 kg)  BMI 21.45 kg/m2 Constitutional: Well-developed and well-nourished. No distress. HEENT: Normocephalic and atraumatic. Oropharynx is clear and moist. No oropharyngeal exudate. Conjunctivae are normal.  No scleral icterus. Neck: Neck supple. Trachea midline. Cardiovascular: Normal rate, regular rhythm and intact distal pulses. No M/R/G Pulmonary/chest: Effort normal and breath sounds distant. No wheezing, rales or rhonchi. Abdominal: Soft, nontender, nondistended. Bowel sounds active throughout. There are no masses palpable. No hepatosplenomegaly. Extremities: no clubbing, cyanosis, or edema Lymphadenopathy: No cervical adenopathy noted. Neurological: Alert and oriented to person place and time. Skin: Skin is warm and dry. No rashes noted. Psychiatric: Normal mood and affect. Behavior is normal.  RELEVANT LABS AND IMAGING: CBC    Component Value Date/Time   WBC 11.6* 10/01/2013 2131   RBC 5.05 10/01/2013 2131   HGB 16.1 10/01/2013 2131   HCT 48.0 10/01/2013 2131   PLT 214 10/01/2013 2131   MCV 95 10/01/2013 2131   MCH 32.0 10/01/2013 2131   MCHC 33.6 10/01/2013 2131   RDW 12.4 10/01/2013 2131    CMP     Component Value Date/Time   NA 138 10/01/2013 2131   K 4.2 10/01/2013 2131   CL 107 10/01/2013 2131   CO2 23 10/01/2013 2131   GLUCOSE 118* 10/01/2013 2131   BUN 11 10/01/2013 2131   CREATININE 0.93 10/01/2013 2131   CALCIUM 8.7 10/01/2013 2131   GFRNONAA >60 10/01/2013 2131   GFRAA >60 10/01/2013 2131     ASSESSMENT/PLAN:  69 yo male with PMH of ulcerative proctitis, adenomatous colon polyps, COPD who seen in evaluation for second opinion regarding ulcerative colitis/proctitis.  1. UC versus ulcerative proctitis -- the patient still has symptoms of active: Inflammation manifested by more frequent and loose stools. He has not responded completely to Lialda. Endoscopically the most intense inflammation was in the rectum and distal sigmoid. Biopsies showed inflammation in the mid and proximal colon but this was not evident endoscopically.  We discussed repeat endoscopy for reassessment of disease activity versus adding a topical per rectum medication to try to get the proctitis under control. Fortunately he is not having many systemic symptoms at present. Continue Lialda 3 tablets daily, add Rowasa 4 g daily at bedtime. I asked that he notify me if he's having trouble retaining these enemas overnight. I would like to see him back in 8 weeks to reassess symptoms  and if no improvement will likely need repeat colonoscopy prior to escalation of therapy.  2. Adenomatous colon polyps -- from surveillance respective would recommend repeat colonoscopy in 3 years from previous which would be July 2018      WX:IPPNDL C [name], [address] [place], [place] [id number]

## 2014-09-05 ENCOUNTER — Encounter: Payer: Self-pay | Admitting: *Deleted

## 2014-09-08 ENCOUNTER — Telehealth: Payer: Self-pay | Admitting: Internal Medicine

## 2014-09-08 MED ORDER — MESALAMINE 4 G RE ENEM: 4.0000 g | ENEMA | Freq: Every day | RECTAL | Status: DC

## 2014-09-08 NOTE — Telephone Encounter (Signed)
Patient just received his Rowasa enema from [place], but they only sent him 7 of them.  He is advised that he should contact them about mix up.  I am mailing him a new rx.  He is rescheduled to 11/15/14

## 2014-09-12 ENCOUNTER — Ambulatory Visit: Payer: Medicare Other | Admitting: Internal Medicine

## 2014-09-19 DIAGNOSIS — H2513 Age-related nuclear cataract, bilateral: Secondary | ICD-10-CM | POA: Diagnosis not present

## 2014-11-08 ENCOUNTER — Ambulatory Visit (INDEPENDENT_AMBULATORY_CARE_PROVIDER_SITE_OTHER): Payer: Medicare Other | Admitting: Family Medicine

## 2014-11-08 ENCOUNTER — Encounter: Payer: Self-pay | Admitting: Family Medicine

## 2014-11-08 VITALS — BP 120/60 | HR 70 | Ht 71.0 in | Wt 145.0 lb

## 2014-11-08 DIAGNOSIS — Z23 Encounter for immunization: Secondary | ICD-10-CM | POA: Diagnosis not present

## 2014-11-08 DIAGNOSIS — Z125 Encounter for screening for malignant neoplasm of prostate: Secondary | ICD-10-CM | POA: Diagnosis not present

## 2014-11-08 DIAGNOSIS — Z0000 Encounter for general adult medical examination without abnormal findings: Secondary | ICD-10-CM

## 2014-11-08 DIAGNOSIS — I10 Essential (primary) hypertension: Secondary | ICD-10-CM | POA: Diagnosis not present

## 2014-11-08 LAB — HEMOCCULT GUIAC POC 1CARD (OFFICE): Fecal Occult Blood, POC: NEGATIVE

## 2014-11-08 LAB — POCT URINALYSIS DIPSTICK
Bilirubin, UA: NEGATIVE
Glucose, UA: NEGATIVE
Ketones, UA: NEGATIVE
Leukocytes, UA: NEGATIVE
NITRITE UA: NEGATIVE
PH UA: 5
Protein, UA: NEGATIVE
RBC UA: NEGATIVE
SPEC GRAV UA: 1.01
UROBILINOGEN UA: 0.2

## 2014-11-08 NOTE — Progress Notes (Signed)
Name: Antonio [name]   MRN: [id number]    DOB: Oct 23, 1945   Date:11/08/2014       Progress Note  Subjective  Chief Complaint  Chief Complaint  Patient presents with  . Annual Exam    HPI Comments: Complete physical with no subjective/objective concerns.   No problem-specific assessment & plan notes found for this encounter.   Past Medical History  Diagnosis Date  . Muscle pain   . Muscle cramp   . Calf pain   . COPD (chronic obstructive pulmonary disease)   . Ulcerative colitis   . Tubular adenoma of colon   . Internal hemorrhoids   . Allergy   . Hypokalemia     Past Surgical History  Procedure Laterality Date  . Meniscus repair Left 12/2013  . Leg stent Left approx 45 yrs ago  . Rotator cuff repair Left 2007  . Hemorrhoid surgery  1988  . Tonsillectomy and adenoidectomy  1957    Family History  Problem Relation Age of Onset  . Cancer Mother     leukemia  . Heart disease Father     Social History   Social History  . Marital Status: Married    Spouse Name: N/A  . Number of Children: 2  . Years of Education: N/A   Occupational History  . retired    Social History Main Topics  . Smoking status: Former Smoker -- 0.75 packs/day for 35 years    Types: Cigarettes, Cigars    Quit date: 06/11/2011  . Smokeless tobacco: Never Used     Comment: off and on smoker   . Alcohol Use: 4.2 oz/week    7 Standard drinks or equivalent per week  . Drug Use: No  . Sexual Activity: Yes   Other Topics Concern  . Not on file   Social History Narrative    Allergies  Allergen Reactions  . Penicillins Rash     Review of Systems  Constitutional: Negative for fever, chills, weight loss and malaise/fatigue.  HENT: Positive for hearing loss and tinnitus. Negative for ear discharge, ear pain and sore throat.   Eyes: Negative for blurred vision.  Respiratory: Positive for shortness of breath. Negative for cough, sputum production and wheezing.   Cardiovascular:  Negative for chest pain, palpitations and leg swelling.  Gastrointestinal: Negative for heartburn, nausea, abdominal pain, diarrhea, constipation, blood in stool and melena.       Ulcerative colitis/some assistance from imodium  Genitourinary: Negative for dysuria, urgency, frequency and hematuria.  Musculoskeletal: Negative for myalgias, back pain, joint pain and neck pain.  Skin: Negative for rash.  Neurological: Negative for dizziness, tingling, sensory change, focal weakness and headaches.  Endo/Heme/Allergies: Negative for environmental allergies and polydipsia. Does not bruise/bleed easily.  Psychiatric/Behavioral: Negative for depression and suicidal ideas. The patient is not nervous/anxious and does not have insomnia.      Objective  Filed Vitals:   11/08/14 0940  BP: 120/60  Pulse: 70  Height: 5' 11"  (1.803 m)  Weight: 145 lb (65.772 kg)    Physical Exam  Constitutional: He is oriented to person, place, and time and well-developed, well-nourished, and in no distress.  HENT:  Head: Normocephalic.  Right Ear: External ear normal.  Left Ear: External ear normal.  Nose: Nose normal.  Mouth/Throat: Oropharynx is clear and moist.  Eyes: Conjunctivae and EOM are normal. Pupils are equal, round, and reactive to light. Right eye exhibits no discharge. Left eye exhibits no discharge. No scleral icterus.  Neck:  Normal range of motion. Neck supple. No JVD present. No tracheal deviation present. No thyromegaly present.  Cardiovascular: Normal rate, regular rhythm, normal heart sounds and intact distal pulses.  Exam reveals no gallop and no friction rub.   No murmur heard. Pulmonary/Chest: Breath sounds normal. No respiratory distress. He has no wheezes. He has no rales.  Abdominal: Soft. Bowel sounds are normal. He exhibits no mass. There is no hepatosplenomegaly. There is no tenderness. There is no rebound, no guarding and no CVA tenderness.  Genitourinary: Rectum normal and prostate  normal. Guaiac negative stool.  Musculoskeletal: Normal range of motion. He exhibits no edema or tenderness.  Lymphadenopathy:    He has no cervical adenopathy.  Neurological: He is alert and oriented to person, place, and time. He has normal sensation, normal strength, normal reflexes and intact cranial nerves. No cranial nerve deficit.  Skin: Skin is warm. No rash noted.  Psychiatric: Mood and affect normal.      Assessment & Plan  Problem List Items Addressed This Visit    None    Visit Diagnoses    Annual physical exam    -  Primary    Relevant Orders    Renal Function Panel    Lipid Profile    POCT Occult Blood Stool (Completed)    PSA    POCT Urinalysis Dipstick (Completed)    Need for influenza vaccination        Relevant Orders    Flu Vaccine QUAD 36+ mos PF IM (Fluarix & Fluzone Quad PF) (Completed)         Dr. [facility]  11/08/2014

## 2014-11-09 ENCOUNTER — Other Ambulatory Visit: Payer: Self-pay

## 2014-11-09 LAB — RENAL FUNCTION PANEL
Albumin: 4.5 g/dL (ref 3.5–4.8)
BUN/Creatinine Ratio: 9 — ABNORMAL LOW (ref 10–22)
BUN: 8 mg/dL (ref 8–27)
CALCIUM: 9.6 mg/dL (ref 8.6–10.2)
CHLORIDE: 101 mmol/L (ref 97–108)
CO2: 25 mmol/L (ref 18–29)
Creatinine, Ser: 0.86 mg/dL (ref 0.76–1.27)
GFR calc Af Amer: 101 mL/min/{1.73_m2} (ref >59)
GFR calc non Af Amer: 87 mL/min/{1.73_m2} (ref >59)
GLUCOSE: 78 mg/dL (ref 65–99)
Phosphorus: 2.9 mg/dL (ref 2.5–4.5)
Potassium: 4.7 mmol/L (ref 3.5–5.2)
SODIUM: 140 mmol/L (ref 134–144)

## 2014-11-09 LAB — LIPID PANEL
CHOLESTEROL TOTAL: 154 mg/dL (ref 100–199)
Chol/HDL Ratio: 2.9 ratio units (ref 0.0–5.0)
HDL: 53 mg/dL (ref >39)
LDL CALC: 89 mg/dL (ref 0–99)
TRIGLYCERIDES: 61 mg/dL (ref 0–149)
VLDL CHOLESTEROL CAL: 12 mg/dL (ref 5–40)

## 2014-11-09 LAB — PSA: Prostate Specific Ag, Serum: 0.4 ng/mL (ref 0.0–4.0)

## 2014-11-15 ENCOUNTER — Ambulatory Visit (INDEPENDENT_AMBULATORY_CARE_PROVIDER_SITE_OTHER): Payer: Medicare Other | Admitting: Internal Medicine

## 2014-11-15 ENCOUNTER — Encounter: Payer: Self-pay | Admitting: Internal Medicine

## 2014-11-15 VITALS — BP 138/70 | HR 60 | Ht 70.0 in | Wt 148.0 lb

## 2014-11-15 DIAGNOSIS — R195 Other fecal abnormalities: Secondary | ICD-10-CM

## 2014-11-15 DIAGNOSIS — K51011 Ulcerative (chronic) pancolitis with rectal bleeding: Secondary | ICD-10-CM | POA: Diagnosis not present

## 2014-11-15 MED ORDER — BUDESONIDE 9 MG PO TB24: 9.0000 mg | ORAL_TABLET | Freq: Every day | ORAL | Status: DC

## 2014-11-15 NOTE — Patient Instructions (Signed)
Continue taking Lialda 3.6 grams (3 tabs) daily. You may take these all at once.  We have sent the following medications to your pharmacy for you to pick up at your convenience: Uceris 9 mg daily x 8 weeks.  Please discontinue Rowasa enemas for now.  Continue Imodium every morning as needed.  You have been scheduled to see Dr Hilarie Fredrickson in follow up on Friday, 01/19/15 @ 2:45 pm.

## 2014-11-15 NOTE — Progress Notes (Signed)
Subjective:    Patient ID: Antonio [name], male    DOB: Jul 25, 1945, 69 y.o.   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 He was initially seen in July 2016. At the time of his last visit he was having unpredictable urgent stooling with occasional blood. His Lialda was continued and Rowasa was added. He reports that the Rowasa was difficult for him to retain and he used it only for a short time now using it very sporadically. Overall he feels well but bowel movements is still not returned to normal for him. He is using Imodium 2 mg on most mornings. He's having a bowel movement in the morning which can be formed but then he'll have a loose stool shortly thereafter each morning and then usually go again in the evening. With Imodium he rarely goes again in the evening. Stools are unpredictable and at times slightly bloody. He denies abdominal pain. Reports good appetite. Able to get out and play golf. Denies nausea or vomiting. No rashes. When asked he would rather have more predictable bowel movements on a regular basis that are formed which is his biggest concern.   Review of Systems As per history of present illness, otherwise negative  Current Medications, Allergies, Past Medical History, Past Surgical History, Family History and Social History were reviewed in [employer] record.     Objective:   Physical Exam BP 138/70 mmHg  Pulse 60  Ht 5' 10"  (1.778 m)  Wt 148 lb (67.132 kg)  BMI 21.24 kg/m2 Constitutional: Well-developed and well-nourished. No distress. Breathing with pursed lips at times HEENT: Normocephalic and atraumatic. Oropharynx is clear and moist. No oropharyngeal exudate. Conjunctivae are normal.  No scleral icterus. Neck: Neck supple. Trachea midline. Cardiovascular: Normal rate, regular rhythm and intact  distal pulses. No M/R/G Pulmonary/chest: Effort normal and breath sounds normal but distant. Abdominal: Soft, nontender, nondistended. Bowel sounds active throughout.  Extremities: no clubbing, cyanosis, or edema Lymphadenopathy: No cervical adenopathy noted. Neurological: Alert and oriented to person place and time. Skin: Skin is warm and dry. No rashes noted. Psychiatric: Normal mood and affect. Behavior is normal.  CBC    Component Value Date/Time   WBC 11.6* 10/01/2013 2131   RBC 5.05 10/01/2013 2131   HGB 16.1 10/01/2013 2131   HCT 48.0 10/01/2013 2131   PLT 214 10/01/2013 2131   MCV 95 10/01/2013 2131   MCH 32.0 10/01/2013 2131   MCHC 33.6 10/01/2013 2131   RDW 12.4 10/01/2013 2131    CMP     Component Value Date/Time   NA 140 11/08/2014 1030   NA 138 10/01/2013 2131   K 4.7 11/08/2014 1030   K 4.2 10/01/2013 2131   CL 101 11/08/2014 1030   CL 107 10/01/2013 2131   CO2 25 11/08/2014 1030   CO2 23 10/01/2013 2131   GLUCOSE 78 11/08/2014 1030   GLUCOSE 118* 10/01/2013 2131   BUN 8 11/08/2014 1030   BUN 11 10/01/2013 2131   CREATININE 0.86 11/08/2014 1030   CREATININE 0.93 10/01/2013 2131   CALCIUM 9.6 11/08/2014 1030   CALCIUM 8.7 10/01/2013 2131   GFRNONAA 87 11/08/2014 1030   GFRNONAA >60 10/01/2013 2131   GFRAA 101 11/08/2014 1030   GFRAA >60 10/01/2013 2131    Records from Dr. Allen Norris  Colonoscopy 09/09/2013 -- severe proctitis in the rectum and distal rectum.  Biopsies obtained. 3 mm cecal polyp removed. 3 sessile polyps in the ascending colon ranging 4-6 mm in size removed. 4 mm transverse colon polyp removed. Pathology = rectal biopsies -- marked active colitis with architectural features of chronicity. Negative for dysplasia. Ascending colon polyps -- tubular adenoma 2 and polypoid fragment of colonic mucosa with moderate active colitis. Cecal polyp -- early hyperplastic polyp. Transverse colon polyp -- moderate active colitis.    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  1. Ulcerative colitis -- overall he's doing well but bowel movements have not returned completely to normal. They are still unpredictable and occasionally bloody. He is not having abdominal pain or systemic symptoms. Did not tolerate Rowasa due to inability to retain enema. He is continued on Lialda 3.6 g daily. Imodium seems to be helping. We discussed possible repeat colonoscopy to evaluate disease activity, but first I'm going to treat him with Uceris 9 mg daily 8 weeks. If this totally normalizes bowel movements then repeat colonoscopy will likely not be necessary. Thereafter we would continue Lialda alone. If symptoms fail to normalize completely with colonic release budesonide then I would recommend repeat colonoscopy. He will return in 8-10 weeks for reassessment. He is happy with this plan. Annual flu vaccine recommended  2. History of adenomatous polyps -- repeat colonoscopy may be necessary as per #1. Otherwise surveillance would be due July 2018  25 min spent with pt today

## 2014-11-16 ENCOUNTER — Telehealth: Payer: Self-pay | Admitting: *Deleted

## 2014-11-16 NOTE — Telephone Encounter (Signed)
The drug rep that was there for lunch yesterday said he could make samples available for him Lennox Solders that is

## 2014-11-16 NOTE — Telephone Encounter (Signed)
I have attempted prior authorization for patient's Uceris. Unfortunately, Medicare part D has denied coverage as they want patient to have tried and failed sulfasalazine (I cannot find documentation in any previous records of him having tried this). Please advise.Marland KitchenMarland Kitchen

## 2014-11-17 MED ORDER — MESALAMINE 1.2 G PO TBEC: 3.6000 g | DELAYED_RELEASE_TABLET | Freq: Every day | ORAL | Status: DC

## 2014-11-17 MED ORDER — BUDESONIDE 9 MG PO TB24: 9.0000 mg | ORAL_TABLET | Freq: Every day | ORAL | Status: DC

## 2014-11-17 NOTE — Telephone Encounter (Signed)
Matt Bowen brought #48 capsules of Uceris for patient to take (patient was to take Uceris 1 capsule daily x 8 weeks). He also requests a new script for Lialda to send to [place] pharmacy as his old prescription is getting ready to run out.

## 2014-12-13 DIAGNOSIS — M9903 Segmental and somatic dysfunction of lumbar region: Secondary | ICD-10-CM | POA: Diagnosis not present

## 2014-12-13 DIAGNOSIS — M461 Sacroiliitis, not elsewhere classified: Secondary | ICD-10-CM | POA: Diagnosis not present

## 2014-12-13 DIAGNOSIS — M9905 Segmental and somatic dysfunction of pelvic region: Secondary | ICD-10-CM | POA: Diagnosis not present

## 2014-12-13 DIAGNOSIS — M9901 Segmental and somatic dysfunction of cervical region: Secondary | ICD-10-CM | POA: Diagnosis not present

## 2014-12-13 DIAGNOSIS — M5136 Other intervertebral disc degeneration, lumbar region: Secondary | ICD-10-CM | POA: Diagnosis not present

## 2014-12-15 DIAGNOSIS — M9905 Segmental and somatic dysfunction of pelvic region: Secondary | ICD-10-CM | POA: Diagnosis not present

## 2014-12-15 DIAGNOSIS — M9901 Segmental and somatic dysfunction of cervical region: Secondary | ICD-10-CM | POA: Diagnosis not present

## 2014-12-15 DIAGNOSIS — M5136 Other intervertebral disc degeneration, lumbar region: Secondary | ICD-10-CM | POA: Diagnosis not present

## 2014-12-15 DIAGNOSIS — M9903 Segmental and somatic dysfunction of lumbar region: Secondary | ICD-10-CM | POA: Diagnosis not present

## 2014-12-15 DIAGNOSIS — M461 Sacroiliitis, not elsewhere classified: Secondary | ICD-10-CM | POA: Diagnosis not present

## 2014-12-20 DIAGNOSIS — M461 Sacroiliitis, not elsewhere classified: Secondary | ICD-10-CM | POA: Diagnosis not present

## 2014-12-20 DIAGNOSIS — M5136 Other intervertebral disc degeneration, lumbar region: Secondary | ICD-10-CM | POA: Diagnosis not present

## 2014-12-20 DIAGNOSIS — M9903 Segmental and somatic dysfunction of lumbar region: Secondary | ICD-10-CM | POA: Diagnosis not present

## 2014-12-20 DIAGNOSIS — M9905 Segmental and somatic dysfunction of pelvic region: Secondary | ICD-10-CM | POA: Diagnosis not present

## 2014-12-20 DIAGNOSIS — M9901 Segmental and somatic dysfunction of cervical region: Secondary | ICD-10-CM | POA: Diagnosis not present

## 2014-12-29 DIAGNOSIS — M9905 Segmental and somatic dysfunction of pelvic region: Secondary | ICD-10-CM | POA: Diagnosis not present

## 2014-12-29 DIAGNOSIS — M461 Sacroiliitis, not elsewhere classified: Secondary | ICD-10-CM | POA: Diagnosis not present

## 2014-12-29 DIAGNOSIS — M9903 Segmental and somatic dysfunction of lumbar region: Secondary | ICD-10-CM | POA: Diagnosis not present

## 2014-12-29 DIAGNOSIS — M5136 Other intervertebral disc degeneration, lumbar region: Secondary | ICD-10-CM | POA: Diagnosis not present

## 2014-12-29 DIAGNOSIS — M9901 Segmental and somatic dysfunction of cervical region: Secondary | ICD-10-CM | POA: Diagnosis not present

## 2015-01-02 ENCOUNTER — Ambulatory Visit (INDEPENDENT_AMBULATORY_CARE_PROVIDER_SITE_OTHER): Payer: Medicare Other | Admitting: Pulmonary Disease

## 2015-01-02 ENCOUNTER — Encounter: Payer: Self-pay | Admitting: Pulmonary Disease

## 2015-01-02 VITALS — BP 126/70 | HR 66 | Ht 72.0 in | Wt 145.0 lb

## 2015-01-02 DIAGNOSIS — J449 Chronic obstructive pulmonary disease, unspecified: Secondary | ICD-10-CM | POA: Diagnosis not present

## 2015-01-02 MED ORDER — ALBUTEROL SULFATE HFA 108 (90 BASE) MCG/ACT IN AERS: 2.0000 | INHALATION_SPRAY | Freq: Four times a day (QID) | RESPIRATORY_TRACT | Status: DC | PRN

## 2015-01-02 MED ORDER — BUDESONIDE-FORMOTEROL FUMARATE 80-4.5 MCG/ACT IN AERO: 2.0000 | INHALATION_SPRAY | Freq: Two times a day (BID) | RESPIRATORY_TRACT | Status: DC

## 2015-01-02 NOTE — Assessment & Plan Note (Signed)
Despite his severe disease he remains quite active and has minimal symptoms. He says that his breathing has improved since starting Symbicort. His flu shot and both pneumonia vaccines are up-to-date.  Plan: Continue Symbicort 80/4.52 puffs twice a day Continue albuterol as needed Stay active Follow-up 6 months or sooner if needed

## 2015-01-02 NOTE — Progress Notes (Signed)
Subjective:    Patient ID: Antonio [name], male    DOB: 1946-02-03, 69 y.o.   MRN: [id number]  Synopsis: He was referred for shortness of breath in 2016. He had a lengthy smoking history but quit smoking in 2014. April 2016 pulmonary function testing confirmed COPD which was severe with an FEV1 of 1.47 L (40% predicted).  HPI Chief Complaint  Patient presents with  . Follow-up    pt doing well, c/o DOE.     [name] is still playing golf regularly and doing OK. He is not exercising much other than playing golf and doing activities at home.  He and his wife feel that his dypsnea has been better since taking the symbicort.  He remembers taking the albuterol nebulizer one time since the last visit, other than that nothing significant.  He has not been treated with prednisone or any other antibiotics for a respiratory problem since the last visit. He is taking a new medicine called Uceris and is scheduled to take it for 8 weeks total.   Past Medical History  Diagnosis Date  . Muscle pain   . Muscle cramp   . Calf pain   . COPD (chronic obstructive pulmonary disease) ([place])   . Ulcerative colitis ([place])   . Tubular adenoma of colon   . Internal hemorrhoids   . Allergy   . Hypokalemia       Review of Systems     Objective:   Physical Exam  Filed Vitals:   01/02/15 1520  BP: 126/70  Pulse: 66  Height: 6' (1.829 m)  Weight: 145 lb (65.772 kg)  SpO2: 97%   RA  Gen: well appearing HENT: OP clear, TM's clear, neck supple PULM: CTA B, normal effort CV: RRR, no mgr, trace edema GI: BS+, soft, nontender Derm: no cyanosis or rash Psyche: normal mood and affect      Assessment & Plan:   Centrilobular Emphysema Despite his severe disease he remains quite active and has minimal symptoms. He says that his breathing has improved since starting Symbicort. His flu shot and both pneumonia vaccines are up-to-date.  Plan: Continue Symbicort 80/4.52 puffs twice a  day Continue albuterol as needed Stay active Follow-up 6 months or sooner if needed    Updated Medication List Outpatient Encounter Prescriptions as of 01/02/2015  Medication Sig  . Ascorbic Acid (VITAMIN C) 100 MG tablet Take 100 mg by mouth daily.  Marland Kitchen aspirin 81 MG tablet Take 81 mg by mouth daily.  . Budesonide (UCERIS) 9 MG TB24 Take 9 mg by mouth daily.  . mesalamine (LIALDA) 1.2 G EC tablet Take 3 tablets (3.6 g total) by mouth daily with breakfast.  . Multiple Vitamin (MULTIVITAMIN WITH MINERALS) TABS tablet Take 1 tablet by mouth daily.  Marland Kitchen POTASSIUM & SODIUM PHOSPHATES PO Take 1 tablet by mouth daily.  Marland Kitchen Spacer/Aero-Holding Chambers (AEROCHAMBER MV) inhaler Use as instructed  . vitamin E 400 UNIT capsule Take 400 Units by mouth daily.  . [DISCONTINUED] albuterol (PROVENTIL HFA;VENTOLIN HFA) 108 (90 BASE) MCG/ACT inhaler Inhale 2 puffs into the lungs every 6 (six) hours as needed.  . [DISCONTINUED] SYMBICORT 160-4.5 MCG/ACT inhaler Inhale 1 puff into the lungs daily.  Marland Kitchen albuterol (PROVENTIL HFA;VENTOLIN HFA) 108 (90 BASE) MCG/ACT inhaler Inhale 2 puffs into the lungs every 6 (six) hours as needed for wheezing or shortness of breath.  . budesonide-formoterol (SYMBICORT) 80-4.5 MCG/ACT inhaler Inhale 2 puffs into the lungs 2 (two) times daily.  . [DISCONTINUED] fexofenadine (  ALLEGRA) 180 MG tablet Take 180 mg by mouth daily.   No facility-administered encounter medications on file as of 01/02/2015.

## 2015-01-02 NOTE — Patient Instructions (Signed)
Take the Symbicort 2 puffs twice a day as we discussed Use albuterol as needed for shortness of breath Stay active, exercise regularly We will see you back in 6 months or sooner if needed

## 2015-01-15 DIAGNOSIS — M5136 Other intervertebral disc degeneration, lumbar region: Secondary | ICD-10-CM | POA: Diagnosis not present

## 2015-01-15 DIAGNOSIS — M9903 Segmental and somatic dysfunction of lumbar region: Secondary | ICD-10-CM | POA: Diagnosis not present

## 2015-01-15 DIAGNOSIS — M9905 Segmental and somatic dysfunction of pelvic region: Secondary | ICD-10-CM | POA: Diagnosis not present

## 2015-01-15 DIAGNOSIS — M461 Sacroiliitis, not elsewhere classified: Secondary | ICD-10-CM | POA: Diagnosis not present

## 2015-01-15 DIAGNOSIS — M9901 Segmental and somatic dysfunction of cervical region: Secondary | ICD-10-CM | POA: Diagnosis not present

## 2015-01-19 ENCOUNTER — Encounter: Payer: Self-pay | Admitting: Internal Medicine

## 2015-01-19 ENCOUNTER — Ambulatory Visit (INDEPENDENT_AMBULATORY_CARE_PROVIDER_SITE_OTHER): Payer: Medicare Other | Admitting: Internal Medicine

## 2015-01-19 VITALS — BP 136/68 | HR 70 | Ht 71.0 in | Wt 144.6 lb

## 2015-01-19 DIAGNOSIS — R198 Other specified symptoms and signs involving the digestive system and abdomen: Secondary | ICD-10-CM | POA: Diagnosis not present

## 2015-01-19 DIAGNOSIS — K5130 Ulcerative (chronic) rectosigmoiditis without complications: Secondary | ICD-10-CM | POA: Diagnosis not present

## 2015-01-19 MED ORDER — MESALAMINE 1.2 G PO TBEC: 3.6000 g | DELAYED_RELEASE_TABLET | Freq: Every day | ORAL | Status: DC

## 2015-01-19 NOTE — Patient Instructions (Signed)
Continue Lialda 3 tablets every day.  Please follow up with Dr Hilarie Fredrickson in 3 months.  Call our office if colitis symptoms worsen or change. We may need to complete a colonoscopy or escalate therapy.

## 2015-01-19 NOTE — Progress Notes (Signed)
Subjective:    Patient ID: Antonio [name], male    DOB: 04-Apr-1945, 69 y.o.   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 He was last seen on 11/15/2014. At that point he was doing fairly well but bowel movements had not returned completely to normal. They were still unpredictable and occasionally bloody. He was given an 8 week course of Uceris which she has just completed. He feels that it did help. He is now having 1-2 stools per day and stools are a little less unpredictable. He's having some tenesmus about 3 days per week where he feels that he needs to go to the bathroom but does not produce stool. He does produce mucus on occasion though now nonbloody. He is using Imodium 1/2-1 tablet 3-4 times per week. He does this when he has a second bowel movement in the morning. He does not have abdominal pain. No fevers or chills. He is taking Lialda 3 tablets daily. He remains active. He denies upper GI complaints or hepatobiliary complaint.   Review of Systems As per HPI, otherwise normal  Current Medications, Allergies, Past Medical History, Past Surgical History, Family History and Social History were reviewed in [employer] record.     Objective:   Physical Exam BP 136/68 mmHg  Pulse 70  Ht 5' 11"  (1.803 m)  Wt 144 lb 9.6 oz (65.59 kg)  BMI 20.18 kg/m2 Constitutional: Well-developed and well-nourished. No distress. HEENT: Normocephalic and atraumatic. Oropharynx is clear and moist. No oropharyngeal exudate. Conjunctivae are normal.  No scleral icterus. Neck: Neck supple. Trachea midline. Cardiovascular: Normal rate, regular rhythm and intact distal pulses. No M/R/G Pulmonary/chest: Effort normal and breath sounds distant.  No wheezing, rales or rhonchi. Abdominal: Soft, nontender, nondistended. Bowel sounds active throughout.  There are no masses palpable.  Extremities: no clubbing, cyanosis, or edema Neurological: Alert and oriented to person place and time. Skin: Skin is warm and dry.  Psychiatric: Normal mood and affect. Behavior is normal.  Records from Dr. Allen Norris  Colonoscopy 09/09/2013 -- severe proctitis in the rectum and distal rectum. Biopsies obtained. 3 mm cecal polyp removed. 3 sessile polyps in the ascending colon ranging 4-6 mm in size removed. 4 mm transverse colon polyp removed. Pathology = rectal biopsies -- marked active colitis with architectural features of chronicity. Negative for dysplasia. Ascending colon polyps -- tubular adenoma 2 and polypoid fragment of colonic mucosa with moderate active colitis. Cecal polyp -- early hyperplastic polyp. Transverse colon polyp -- moderate active colitis.    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  1. Ulcerative colitis -- prior to Uceris treatment he was concerned with frequency and unpredictability of bowel movement. This seems to have improved. He is still using Imodium several days per week. He is no longer seeing blood. He inquires about escalation of therapy particular to a biologic. At this point I'm not sure this is necessary as I feel overall he is doing quite well. I do want him to continue Lialda 3 tablets daily. We will leave him off of Uceris at present. If colitis symptoms specifically worsening tenesmus, blood in stool, frequency of stool worsens off of budesonide been my recommendation is for repeat colonoscopy to reassess disease activity and extent. Based on  this, escalation of therapy may be warranted. For now we'll continue with Lialda only and observe. He is happy with this plan. He can still use Imodium per box instruction as needed.  Return in 3 months, sooner if necessary 25 minutes spent with the patient today. Greater than  50% was spent in counseling and coordination of care with the patient

## 2015-02-14 DIAGNOSIS — M531 Cervicobrachial syndrome: Secondary | ICD-10-CM | POA: Diagnosis not present

## 2015-02-14 DIAGNOSIS — M461 Sacroiliitis, not elsewhere classified: Secondary | ICD-10-CM | POA: Diagnosis not present

## 2015-02-14 DIAGNOSIS — M9903 Segmental and somatic dysfunction of lumbar region: Secondary | ICD-10-CM | POA: Diagnosis not present

## 2015-02-14 DIAGNOSIS — M5136 Other intervertebral disc degeneration, lumbar region: Secondary | ICD-10-CM | POA: Diagnosis not present

## 2015-02-14 DIAGNOSIS — M9901 Segmental and somatic dysfunction of cervical region: Secondary | ICD-10-CM | POA: Diagnosis not present

## 2015-02-14 DIAGNOSIS — M9905 Segmental and somatic dysfunction of pelvic region: Secondary | ICD-10-CM | POA: Diagnosis not present

## 2015-02-21 DIAGNOSIS — M5136 Other intervertebral disc degeneration, lumbar region: Secondary | ICD-10-CM | POA: Diagnosis not present

## 2015-02-21 DIAGNOSIS — M9903 Segmental and somatic dysfunction of lumbar region: Secondary | ICD-10-CM | POA: Diagnosis not present

## 2015-02-21 DIAGNOSIS — M531 Cervicobrachial syndrome: Secondary | ICD-10-CM | POA: Diagnosis not present

## 2015-02-21 DIAGNOSIS — M9901 Segmental and somatic dysfunction of cervical region: Secondary | ICD-10-CM | POA: Diagnosis not present

## 2015-02-21 DIAGNOSIS — M9905 Segmental and somatic dysfunction of pelvic region: Secondary | ICD-10-CM | POA: Diagnosis not present

## 2015-02-21 DIAGNOSIS — M461 Sacroiliitis, not elsewhere classified: Secondary | ICD-10-CM | POA: Diagnosis not present

## 2015-02-23 DIAGNOSIS — M9901 Segmental and somatic dysfunction of cervical region: Secondary | ICD-10-CM | POA: Diagnosis not present

## 2015-02-23 DIAGNOSIS — M531 Cervicobrachial syndrome: Secondary | ICD-10-CM | POA: Diagnosis not present

## 2015-02-23 DIAGNOSIS — M9903 Segmental and somatic dysfunction of lumbar region: Secondary | ICD-10-CM | POA: Diagnosis not present

## 2015-02-23 DIAGNOSIS — M9905 Segmental and somatic dysfunction of pelvic region: Secondary | ICD-10-CM | POA: Diagnosis not present

## 2015-02-23 DIAGNOSIS — M5136 Other intervertebral disc degeneration, lumbar region: Secondary | ICD-10-CM | POA: Diagnosis not present

## 2015-02-23 DIAGNOSIS — M461 Sacroiliitis, not elsewhere classified: Secondary | ICD-10-CM | POA: Diagnosis not present

## 2015-02-28 DIAGNOSIS — M9901 Segmental and somatic dysfunction of cervical region: Secondary | ICD-10-CM | POA: Diagnosis not present

## 2015-02-28 DIAGNOSIS — M9903 Segmental and somatic dysfunction of lumbar region: Secondary | ICD-10-CM | POA: Diagnosis not present

## 2015-02-28 DIAGNOSIS — M9905 Segmental and somatic dysfunction of pelvic region: Secondary | ICD-10-CM | POA: Diagnosis not present

## 2015-02-28 DIAGNOSIS — M461 Sacroiliitis, not elsewhere classified: Secondary | ICD-10-CM | POA: Diagnosis not present

## 2015-02-28 DIAGNOSIS — M531 Cervicobrachial syndrome: Secondary | ICD-10-CM | POA: Diagnosis not present

## 2015-02-28 DIAGNOSIS — M5136 Other intervertebral disc degeneration, lumbar region: Secondary | ICD-10-CM | POA: Diagnosis not present

## 2015-03-15 DIAGNOSIS — L309 Dermatitis, unspecified: Secondary | ICD-10-CM | POA: Diagnosis not present

## 2015-03-15 DIAGNOSIS — L853 Xerosis cutis: Secondary | ICD-10-CM | POA: Diagnosis not present

## 2015-04-25 DIAGNOSIS — Z1283 Encounter for screening for malignant neoplasm of skin: Secondary | ICD-10-CM | POA: Diagnosis not present

## 2015-04-25 DIAGNOSIS — L821 Other seborrheic keratosis: Secondary | ICD-10-CM | POA: Diagnosis not present

## 2015-04-27 DIAGNOSIS — M7989 Other specified soft tissue disorders: Secondary | ICD-10-CM | POA: Diagnosis not present

## 2015-04-27 DIAGNOSIS — I6523 Occlusion and stenosis of bilateral carotid arteries: Secondary | ICD-10-CM | POA: Diagnosis not present

## 2015-04-27 DIAGNOSIS — I739 Peripheral vascular disease, unspecified: Secondary | ICD-10-CM | POA: Diagnosis not present

## 2015-04-27 DIAGNOSIS — I70212 Atherosclerosis of native arteries of extremities with intermittent claudication, left leg: Secondary | ICD-10-CM | POA: Diagnosis not present

## 2015-04-27 DIAGNOSIS — I70211 Atherosclerosis of native arteries of extremities with intermittent claudication, right leg: Secondary | ICD-10-CM | POA: Diagnosis not present

## 2015-04-30 ENCOUNTER — Ambulatory Visit: Payer: Self-pay | Admitting: Family Medicine

## 2015-04-30 ENCOUNTER — Ambulatory Visit (INDEPENDENT_AMBULATORY_CARE_PROVIDER_SITE_OTHER): Payer: PPO | Admitting: Family Medicine

## 2015-04-30 ENCOUNTER — Encounter: Payer: Self-pay | Admitting: Family Medicine

## 2015-04-30 VITALS — BP 120/60 | HR 80 | Ht 71.0 in | Wt 135.0 lb

## 2015-04-30 DIAGNOSIS — J449 Chronic obstructive pulmonary disease, unspecified: Secondary | ICD-10-CM

## 2015-04-30 DIAGNOSIS — Z91013 Allergy to seafood: Secondary | ICD-10-CM | POA: Diagnosis not present

## 2015-04-30 DIAGNOSIS — J301 Allergic rhinitis due to pollen: Secondary | ICD-10-CM

## 2015-04-30 DIAGNOSIS — J40 Bronchitis, not specified as acute or chronic: Secondary | ICD-10-CM

## 2015-04-30 MED ORDER — DOXYCYCLINE HYCLATE 100 MG PO TABS: 100.0000 mg | ORAL_TABLET | Freq: Two times a day (BID) | ORAL | Status: DC

## 2015-04-30 MED ORDER — ALBUTEROL SULFATE (2.5 MG/3ML) 0.083% IN NEBU: 2.5000 mg | INHALATION_SOLUTION | Freq: Four times a day (QID) | RESPIRATORY_TRACT | Status: DC | PRN

## 2015-04-30 MED ORDER — EPINEPHRINE 0.3 MG/0.3ML IJ SOAJ: 0.3000 mg | Freq: Once | INTRAMUSCULAR | Status: DC

## 2015-04-30 NOTE — Progress Notes (Signed)
Name: Antonio [name]   MRN: [id number]    DOB: 1945/03/23   Date:04/30/2015       Progress Note  Subjective  Chief Complaint  Chief Complaint  Patient presents with  . Fever    body aches, fever 100.8, congestion, hx of pneumonia    Fever  This is a new problem. The current episode started in the past 7 days. The problem occurs daily. The problem has been waxing and waning. The maximum temperature noted was 100 to 100.9 F. Pertinent negatives include no abdominal pain, chest pain, congestion, coughing, diarrhea, ear pain, headaches, muscle aches, nausea, rash, sleepiness, sore throat, urinary pain, vomiting or wheezing. He has tried nothing for the symptoms. The treatment provided mild relief.  Cough This is a chronic problem. The current episode started in the past 7 days. The cough is non-productive. Associated symptoms include a fever. Pertinent negatives include no chest pain, chills, ear congestion, ear pain, headaches, heartburn, myalgias, rash, sore throat, shortness of breath, weight loss or wheezing. There is no history of asthma, bronchiectasis, bronchitis, COPD, emphysema, environmental allergies or pneumonia.    No problem-specific assessment & plan notes found for this encounter.   Past Medical History  Diagnosis Date  . Muscle pain   . Muscle cramp   . Calf pain   . COPD (chronic obstructive pulmonary disease) ([place])   . Ulcerative colitis ([place])   . Tubular adenoma of colon   . Internal hemorrhoids   . Allergy   . Hypokalemia     Past Surgical History  Procedure Laterality Date  . Meniscus repair Left 12/2013  . Leg stent Left approx 45 yrs ago  . Rotator cuff repair Left 2007  . Hemorrhoid surgery  1988  . Tonsillectomy and adenoidectomy  1957    Family History  Problem Relation Age of Onset  . Cancer Mother     leukemia  . Heart disease Father     Social History   Social History  . Marital Status: Married    Spouse Name: N/A  . Number of  Children: 2  . Years of Education: N/A   Occupational History  . retired    Social History Main Topics  . Smoking status: Former Smoker -- 0.75 packs/day for 35 years    Types: Cigarettes, Cigars    Quit date: 06/11/2011  . Smokeless tobacco: Never Used     Comment: off and on smoker   . Alcohol Use: 4.2 oz/week    7 Standard drinks or equivalent per week  . Drug Use: No  . Sexual Activity: Yes   Other Topics Concern  . Not on file   Social History Narrative    Allergies  Allergen Reactions  . Penicillins Rash     Review of Systems  Constitutional: Positive for fever. Negative for chills, weight loss and malaise/fatigue.  HENT: Negative for congestion, ear discharge, ear pain and sore throat.   Eyes: Negative for blurred vision.  Respiratory: Negative for cough, sputum production, shortness of breath and wheezing.   Cardiovascular: Negative for chest pain, palpitations and leg swelling.  Gastrointestinal: Negative for heartburn, nausea, vomiting, abdominal pain, diarrhea, constipation, blood in stool and melena.  Genitourinary: Negative for dysuria, urgency, frequency and hematuria.  Musculoskeletal: Negative for myalgias, back pain, joint pain and neck pain.  Skin: Negative for rash.  Neurological: Negative for dizziness, tingling, sensory change, focal weakness and headaches.  Endo/Heme/Allergies: Negative for environmental allergies and polydipsia. Does not bruise/bleed easily.  Psychiatric/Behavioral:  Negative for depression and suicidal ideas. The patient is not nervous/anxious and does not have insomnia.      Objective  Filed Vitals:   04/30/15 1557  BP: 120/60  Pulse: 80  Height: 5' 11"  (1.803 m)  Weight: 135 lb (61.236 kg)    Physical Exam  Constitutional: He is oriented to person, place, and time and well-developed, well-nourished, and in no distress.  HENT:  Head: Normocephalic.  Right Ear: External ear normal.  Left Ear: External ear normal.   Nose: Nose normal.  Mouth/Throat: Oropharynx is clear and moist.  Eyes: Conjunctivae and EOM are normal. Pupils are equal, round, and reactive to light. Right eye exhibits no discharge. Left eye exhibits no discharge. No scleral icterus.  Neck: Normal range of motion. Neck supple. No JVD present. No tracheal deviation present. No thyromegaly present.  Cardiovascular: Normal rate, regular rhythm, normal heart sounds and intact distal pulses.  Exam reveals no gallop and no friction rub.   No murmur heard. Pulmonary/Chest: Breath sounds normal. No respiratory distress. He has no wheezes. He has no rales.  Abdominal: Soft. Bowel sounds are normal. He exhibits no mass. There is no hepatosplenomegaly. There is no tenderness. There is no rebound, no guarding and no CVA tenderness.  Musculoskeletal: Normal range of motion. He exhibits no edema or tenderness.  Lymphadenopathy:    He has no cervical adenopathy.  Neurological: He is alert and oriented to person, place, and time. He has normal sensation, normal strength, normal reflexes and intact cranial nerves. No cranial nerve deficit.  Skin: Skin is warm. No rash noted.  Psychiatric: Mood and affect normal.      Assessment & Plan  Problem List Items Addressed This Visit      Respiratory   Centrilobular Emphysema   Relevant Medications   albuterol (PROVENTIL) (2.5 MG/3ML) 0.083% nebulizer solution    Other Visit Diagnoses    Bronchitis    -  Primary    Allergic rhinitis due to pollen        Shellfish allergy             Dr. [facility]  04/30/2015

## 2015-05-08 DIAGNOSIS — M19012 Primary osteoarthritis, left shoulder: Secondary | ICD-10-CM | POA: Diagnosis not present

## 2015-05-08 DIAGNOSIS — S46112A Strain of muscle, fascia and tendon of long head of biceps, left arm, initial encounter: Secondary | ICD-10-CM | POA: Diagnosis not present

## 2015-06-05 DIAGNOSIS — M9901 Segmental and somatic dysfunction of cervical region: Secondary | ICD-10-CM | POA: Diagnosis not present

## 2015-06-05 DIAGNOSIS — M9905 Segmental and somatic dysfunction of pelvic region: Secondary | ICD-10-CM | POA: Diagnosis not present

## 2015-06-05 DIAGNOSIS — M531 Cervicobrachial syndrome: Secondary | ICD-10-CM | POA: Diagnosis not present

## 2015-06-05 DIAGNOSIS — M5136 Other intervertebral disc degeneration, lumbar region: Secondary | ICD-10-CM | POA: Diagnosis not present

## 2015-06-05 DIAGNOSIS — M461 Sacroiliitis, not elsewhere classified: Secondary | ICD-10-CM | POA: Diagnosis not present

## 2015-06-05 DIAGNOSIS — M9903 Segmental and somatic dysfunction of lumbar region: Secondary | ICD-10-CM | POA: Diagnosis not present

## 2015-06-19 ENCOUNTER — Encounter: Payer: Self-pay | Admitting: Pulmonary Disease

## 2015-06-19 ENCOUNTER — Ambulatory Visit (INDEPENDENT_AMBULATORY_CARE_PROVIDER_SITE_OTHER): Payer: PPO | Admitting: Pulmonary Disease

## 2015-06-19 VITALS — BP 122/66 | HR 74 | Ht 72.0 in | Wt 143.6 lb

## 2015-06-19 DIAGNOSIS — J449 Chronic obstructive pulmonary disease, unspecified: Secondary | ICD-10-CM | POA: Diagnosis not present

## 2015-06-19 NOTE — Patient Instructions (Signed)
Continue taking your medications as you're doing Remember to rinse your mouth after using Symbicort Remained active I will see you back in 6 months or sooner if needed

## 2015-06-19 NOTE — Progress Notes (Signed)
Subjective:    Patient ID: Antonio [name], male    DOB: 21-Aug-1945, 70 y.o.   MRN: [id number]  Synopsis: He was referred for shortness of breath in 2016. He had a lengthy smoking history but quit smoking in 2014. April 2016 pulmonary function testing confirmed COPD which was severe with an FEV1 of 1.47 L (40% predicted).  HPI Chief Complaint  Patient presents with  . Follow-up    pt doing well, states pollen is worsening runny nose, PND, sob.  states typically he is doing well.    [place] has been doing well.  He has been taking Symbicort he gets from a [facility] for a significant discount. He says it has been working well. He also sees D.r Pyrtle for his ulcerative colitis and he has experienced improved symptoms in that regard.  He had an episode of dyspnea sometime back where he needed albuterol through a nebulizer which helped.  This happened spontaneously after eating.  He doesn't recall allergy symptoms but he ws feeling abdominal bloating.  No flare ups since the last visit. He remains active and has not been limited by dyspnea.    Past Medical History  Diagnosis Date  . Muscle pain   . Muscle cramp   . Calf pain   . COPD (chronic obstructive pulmonary disease) ([place])   . Ulcerative colitis ([place])   . Tubular adenoma of colon   . Internal hemorrhoids   . Allergy   . Hypokalemia       Review of Systems     Objective:   Physical Exam  Filed Vitals:   06/19/15 1423  BP: 122/66  Pulse: 74  Height: 6' (1.829 m)  Weight: 143 lb 9.6 oz (65.137 kg)  SpO2: 95%   RA  Gen: well appearing HENT: OP clear, TM's clear, neck supple PULM: CTA B, normal effort CV: RRR, no mgr, trace edema GI: BS+, soft, nontender Derm: no cyanosis or rash Psyche: normal mood and affect      Assessment & Plan:   Centrilobular Emphysema This has been a stable interval for him.  He has not had an exacerbation since the last visit and he remains active.  Plan: Continue  symbicort Stay active F/u 6 months    Updated Medication List Outpatient Encounter Prescriptions as of 06/19/2015  Medication Sig  . albuterol (PROVENTIL HFA;VENTOLIN HFA) 108 (90 BASE) MCG/ACT inhaler Inhale 2 puffs into the lungs every 6 (six) hours as needed for wheezing or shortness of breath.  Marland Kitchen albuterol (PROVENTIL) (2.5 MG/3ML) 0.083% nebulizer solution Inhale 1 mL into the lungs as needed. Pt to use 1 vial as needed for SOB- may have # 30 vials  . albuterol (PROVENTIL) (2.5 MG/3ML) 0.083% nebulizer solution Take 3 mLs (2.5 mg total) by nebulization every 6 (six) hours as needed for wheezing or shortness of breath.  . Ascorbic Acid (VITAMIN C) 100 MG tablet Take 100 mg by mouth daily.  Marland Kitchen aspirin 81 MG tablet Take 81 mg by mouth daily.  . Budesonide (UCERIS) 9 MG TB24 Take 9 mg by mouth daily.  . budesonide-formoterol (SYMBICORT) 80-4.5 MCG/ACT inhaler Inhale 2 puffs into the lungs 2 (two) times daily.  Marland Kitchen EPINEPHrine 0.3 mg/0.3 mL IJ SOAJ injection Inject 0.3 mLs (0.3 mg total) into the muscle once.  . mesalamine (LIALDA) 1.2 G EC tablet Take 3 tablets (3.6 g total) by mouth daily with breakfast. (Patient taking differently: Take 2.4 g by mouth daily with breakfast. )  . Multiple  Vitamin (MULTIVITAMIN WITH MINERALS) TABS tablet Take 1 tablet by mouth daily.  Marland Kitchen POTASSIUM & SODIUM PHOSPHATES PO Take 1 tablet by mouth daily.  Marland Kitchen Spacer/Aero-Holding Chambers (AEROCHAMBER MV) inhaler Use as instructed  . vitamin E 400 UNIT capsule Take 400 Units by mouth daily.  . [DISCONTINUED] doxycycline (VIBRA-TABS) 100 MG tablet Take 1 tablet (100 mg total) by mouth 2 (two) times daily. (Patient not taking: Reported on 06/19/2015)   No facility-administered encounter medications on file as of 06/19/2015.

## 2015-06-19 NOTE — Assessment & Plan Note (Signed)
This has been a stable interval for him.  He has not had an exacerbation since the last visit and he remains active.  Plan: Continue symbicort Stay active F/u 6 months

## 2015-07-03 ENCOUNTER — Encounter: Payer: Self-pay | Admitting: Internal Medicine

## 2015-07-03 ENCOUNTER — Other Ambulatory Visit (INDEPENDENT_AMBULATORY_CARE_PROVIDER_SITE_OTHER): Payer: PPO

## 2015-07-03 ENCOUNTER — Ambulatory Visit (INDEPENDENT_AMBULATORY_CARE_PROVIDER_SITE_OTHER): Payer: PPO | Admitting: Internal Medicine

## 2015-07-03 VITALS — BP 120/68 | HR 64 | Ht 71.0 in | Wt 143.4 lb

## 2015-07-03 DIAGNOSIS — K51319 Ulcerative (chronic) rectosigmoiditis with unspecified complications: Secondary | ICD-10-CM | POA: Diagnosis not present

## 2015-07-03 LAB — BASIC METABOLIC PANEL
BUN: 13 mg/dL (ref 6–23)
CALCIUM: 9.6 mg/dL (ref 8.4–10.5)
CO2: 30 mEq/L (ref 19–32)
Chloride: 105 mEq/L (ref 96–112)
Creatinine, Ser: 0.9 mg/dL (ref 0.40–1.50)
GFR: 88.07 mL/min (ref >60.00)
GLUCOSE: 102 mg/dL — AB (ref 70–99)
POTASSIUM: 4.8 meq/L (ref 3.5–5.1)
SODIUM: 139 meq/L (ref 135–145)

## 2015-07-03 LAB — CBC WITH DIFFERENTIAL/PLATELET
BASOS ABS: 0.1 10*3/uL (ref 0.0–0.1)
Basophils Relative: 0.6 % (ref 0.0–3.0)
EOS PCT: 3.2 % (ref 0.0–5.0)
Eosinophils Absolute: 0.3 10*3/uL (ref 0.0–0.7)
HCT: 47 % (ref 39.0–52.0)
HEMOGLOBIN: 15.8 g/dL (ref 13.0–17.0)
LYMPHS PCT: 14.2 % (ref 12.0–46.0)
Lymphs Abs: 1.2 10*3/uL (ref 0.7–4.0)
MCHC: 33.6 g/dL (ref 30.0–36.0)
MCV: 92.5 fl (ref 78.0–100.0)
MONOS PCT: 13.9 % — AB (ref 3.0–12.0)
Monocytes Absolute: 1.2 10*3/uL — ABNORMAL HIGH (ref 0.1–1.0)
Neutro Abs: 5.8 10*3/uL (ref 1.4–7.7)
Neutrophils Relative %: 68.1 % (ref 43.0–77.0)
Platelets: 279 10*3/uL (ref 150.0–400.0)
RBC: 5.08 Mil/uL (ref 4.22–5.81)
RDW: 13.1 % (ref 11.5–15.5)
WBC: 8.6 10*3/uL (ref 4.0–10.5)

## 2015-07-03 LAB — HIGH SENSITIVITY CRP: CRP, High Sensitivity: 15.82 mg/L — ABNORMAL HIGH (ref 0.000–5.000)

## 2015-07-03 MED ORDER — MESALAMINE 1.2 G PO TBEC: 3.6000 g | DELAYED_RELEASE_TABLET | Freq: Every day | ORAL | Status: DC

## 2015-07-03 NOTE — Progress Notes (Signed)
Subjective:    Patient ID: Antonio [name], male    DOB: 02/13/1945, 70 y.o.   MRN: [id number]  HPI Antonio [name] is a 70 year old male with history of ulcerative proctitis/colitis diagnosed in July 2015, adenomatous colon polyps and COPD he is here for follow-up. He was last seen in December 2016. He reports he's been doing well from a colitis standpoint. He has decreased Lialda to 2.4 g per day. He's receiving this through [facility] to help with the high cost of this medication. He is still taking Imodium 1 tablet daily. He reports bowel movements are all formed occurring once per day. Nonbloody and mucus has resolved. No further tenesmus. No abdominal pain. Appetite has been good. No other upper GI complaint or hepatobiliary complaint. He has been playing golf frequently and feeling well..   Review of Systems As per history of present illness, otherwise negative  Current Medications, Allergies, Past Medical History, Past Surgical History, Family History and Social History were reviewed in [employer] record.     Objective:   Physical Exam BP 120/68 mmHg  Pulse 64  Ht 5' 11"  (1.803 m)  Wt 143 lb 6.4 oz (65.046 kg)  BMI 20.01 kg/m2 Constitutional: Well-developed and well-nourished. No distress. HEENT: Normocephalic and atraumatic. Oropharynx is clear and moist. No oropharyngeal exudate. Conjunctivae are normal.  No scleral icterus. Neck: Neck supple. Trachea midline. Cardiovascular: Normal rate, regular rhythm and intact distal pulses. No M/R/G Pulmonary/chest: Effort normal and breath sounds Distant, mild end expiratory wheeze.  Abdominal: Soft, nontender, nondistended. Bowel sounds active throughout. There are no masses palpable. . Extremities: no clubbing, cyanosis, or edema Lymphadenopathy: No cervical adenopathy noted. Neurological: Alert and oriented to person place and time. Skin: Skin is warm and dry. No rashes noted. Psychiatric: Normal mood and  affect. Behavior is normal.  CBC    Component Value Date/Time   WBC 8.6 07/03/2015 0943   WBC 11.6* 10/01/2013 2131   RBC 5.08 07/03/2015 0943   RBC 5.05 10/01/2013 2131   HGB 15.8 07/03/2015 0943   HGB 16.1 10/01/2013 2131   HCT 47.0 07/03/2015 0943   HCT 48.0 10/01/2013 2131   PLT 279.0 07/03/2015 0943   PLT 214 10/01/2013 2131   MCV 92.5 07/03/2015 0943   MCV 95 10/01/2013 2131   MCH 32.0 10/01/2013 2131   MCHC 33.6 07/03/2015 0943   MCHC 33.6 10/01/2013 2131   RDW 13.1 07/03/2015 0943   RDW 12.4 10/01/2013 2131   LYMPHSABS 1.2 07/03/2015 0943   MONOABS 1.2* 07/03/2015 0943   EOSABS 0.3 07/03/2015 0943   BASOSABS 0.1 07/03/2015 0943    CMP     Component Value Date/Time   NA 139 07/03/2015 0943   NA 140 11/08/2014 1030   NA 138 10/01/2013 2131   K 4.8 07/03/2015 0943   K 4.2 10/01/2013 2131   CL 105 07/03/2015 0943   CL 107 10/01/2013 2131   CO2 30 07/03/2015 0943   CO2 23 10/01/2013 2131   GLUCOSE 102* 07/03/2015 0943   GLUCOSE 78 11/08/2014 1030   GLUCOSE 118* 10/01/2013 2131   BUN 13 07/03/2015 0943   BUN 8 11/08/2014 1030   BUN 11 10/01/2013 2131   CREATININE 0.90 07/03/2015 0943   CREATININE 0.93 10/01/2013 2131   CALCIUM 9.6 07/03/2015 0943   CALCIUM 8.7 10/01/2013 2131   ALBUMIN 4.5 11/08/2014 1030   GFRNONAA 87 11/08/2014 1030   GFRNONAA >60 10/01/2013 2131   GFRAA 101 11/08/2014 1030   GFRAA >  60 10/01/2013 2131   Records from Dr. Allen Norris  Colonoscopy 09/09/2013 -- severe proctitis in the rectum and distal rectum. Biopsies obtained. 3 mm cecal polyp removed. 3 sessile polyps in the ascending colon ranging 4-6 mm in size removed. 4 mm transverse colon polyp removed. Pathology = rectal biopsies -- marked active colitis with architectural features of chronicity. Negative for dysplasia. Ascending colon polyps -- tubular adenoma 2 and polypoid fragment of colonic mucosa with moderate active colitis. Cecal polyp -- early hyperplastic polyp. Transverse  colon polyp -- moderate active colitis.       Assessment & Plan:   70 year old male with history of ulcerative proctitis/colitis diagnosed in July 2015, adenomatous colon polyps and COPD he is here for follow-up.  1. Ulcerative colitis -- in clinical remission on 5 ASA only. Continue Lialda 2.4 g daily. I reiterated that he would likely need this chronically to maintain remission. Check basic metabolic panel, CBC and CRP today. We discussed colonoscopy and he is interested in knowing the state of his disease. He also had adenomatous colon polyps 2 years ago. I recommended a colonoscopy next year. He prefers to proceed in November or December of this year. We discussed the risks, benefits and alternatives and he is agreeable to proceed. We will plan colonoscopy likely November 2017. Annual follow-up in the office with me as recommended. Notify me of any colitis flares or change in symptoms.  15 minutes spent with the patient today. Greater than 50% was spent in counseling and coordination of care with the patient

## 2015-07-03 NOTE — Patient Instructions (Addendum)
We have given you a prescription for Lialda 3.6 grams daily.  We have also given you samples of Lialda.  Please discontinue Budesonide.  Your physician has requested that you go to the basement for the following lab work before leaving today: BMET, CBC, CRP  You will be due for a recall colonoscopy in 12/2015. We will send you a reminder in the mail when it gets closer to that time.  If you are age 72 or older, your body mass index should be between 23-30. Your Body mass index is 20.01 kg/(m^2). If this is out of the aforementioned range listed, please consider follow up with your Primary Care Provider.  If you are age 57 or younger, your body mass index should be between 19-25. Your Body mass index is 20.01 kg/(m^2). If this is out of the aformentioned range listed, please consider follow up with your Primary Care Provider.

## 2015-07-04 ENCOUNTER — Other Ambulatory Visit: Payer: Self-pay

## 2015-07-04 DIAGNOSIS — R7982 Elevated C-reactive protein (CRP): Secondary | ICD-10-CM

## 2015-07-16 ENCOUNTER — Encounter: Payer: Self-pay | Admitting: Family Medicine

## 2015-07-16 ENCOUNTER — Ambulatory Visit (INDEPENDENT_AMBULATORY_CARE_PROVIDER_SITE_OTHER): Payer: PPO | Admitting: Family Medicine

## 2015-07-16 VITALS — BP 110/70 | HR 68 | Ht 71.0 in | Wt 141.0 lb

## 2015-07-16 DIAGNOSIS — J219 Acute bronchiolitis, unspecified: Secondary | ICD-10-CM

## 2015-07-16 DIAGNOSIS — J449 Chronic obstructive pulmonary disease, unspecified: Secondary | ICD-10-CM | POA: Diagnosis not present

## 2015-07-16 MED ORDER — AZITHROMYCIN 250 MG PO TABS: ORAL_TABLET | ORAL | Status: DC

## 2015-07-16 NOTE — Progress Notes (Signed)
Name: Antonio [name]   MRN: [id number]    DOB: 1945-04-07   Date:07/16/2015       Progress Note  Subjective  Chief Complaint  Chief Complaint  Patient presents with  . Sinusitis    congestion/ especially in the am- taking Zyrtec  . Poor Circulation    index and thumbs on both hands stay cold    HPI Comments: Since 1980's cold weather causes  Skin changes noted  Sinusitis This is a new problem. The current episode started 1 to 4 weeks ago. The problem has been waxing and waning since onset. There has been no fever. Associated symptoms include congestion, coughing, shortness of breath and sinus pressure. Pertinent negatives include no chills, diaphoresis, ear pain, headaches, hoarse voice, neck pain, sneezing, sore throat or swollen glands.    No problem-specific assessment & plan notes found for this encounter.   Past Medical History  Diagnosis Date  . Muscle pain   . Muscle cramp   . Calf pain   . COPD (chronic obstructive pulmonary disease) ([place])   . Ulcerative colitis ([place])   . Tubular adenoma of colon   . Internal hemorrhoids   . Allergy   . Hypokalemia     Past Surgical History  Procedure Laterality Date  . Meniscus repair Left 12/2013  . Leg stent Left approx 45 yrs ago  . Rotator cuff repair Left 2007  . Hemorrhoid surgery  1988  . Tonsillectomy and adenoidectomy  1957    Family History  Problem Relation Age of Onset  . Cancer Mother     leukemia  . Heart disease Father     Social History   Social History  . Marital Status: Married    Spouse Name: N/A  . Number of Children: 2  . Years of Education: N/A   Occupational History  . retired    Social History Main Topics  . Smoking status: Former Smoker -- 0.75 packs/day for 35 years    Types: Cigarettes, Cigars    Quit date: 06/11/2011  . Smokeless tobacco: Never Used     Comment: off and on smoker   . Alcohol Use: 4.2 oz/week    7 Standard drinks or equivalent per week  . Drug Use: No  .  Sexual Activity: Yes   Other Topics Concern  . Not on file   Social History Narrative    Allergies  Allergen Reactions  . Penicillins Rash     Review of Systems  Constitutional: Negative for fever, chills, weight loss, malaise/fatigue and diaphoresis.  HENT: Positive for congestion and sinus pressure. Negative for ear discharge, ear pain, hoarse voice, sneezing and sore throat.   Eyes: Negative for blurred vision.  Respiratory: Positive for cough and shortness of breath. Negative for sputum production and wheezing.   Cardiovascular: Negative for chest pain, palpitations and leg swelling.  Gastrointestinal: Negative for heartburn, nausea, abdominal pain, diarrhea, constipation, blood in stool and melena.  Genitourinary: Negative for dysuria, urgency, frequency and hematuria.  Musculoskeletal: Negative for myalgias, back pain, joint pain and neck pain.  Skin: Negative for rash.  Neurological: Negative for dizziness, tingling, sensory change, focal weakness and headaches.  Endo/Heme/Allergies: Negative for environmental allergies and polydipsia. Does not bruise/bleed easily.  Psychiatric/Behavioral: Negative for depression and suicidal ideas. The patient is not nervous/anxious and does not have insomnia.      Objective  Filed Vitals:   07/16/15 1358  BP: 110/70  Pulse: 68  Height: 5' 11"  (1.803 m)  Weight:  141 lb (63.957 kg)    Physical Exam  Constitutional: He is oriented to person, place, and time and well-developed, well-nourished, and in no distress.  HENT:  Head: Normocephalic.  Right Ear: External ear normal.  Left Ear: External ear normal.  Nose: Nose normal.  Mouth/Throat: Oropharynx is clear and moist.  Eyes: Conjunctivae and EOM are normal. Pupils are equal, round, and reactive to light. Right eye exhibits no discharge. Left eye exhibits no discharge. No scleral icterus.  Neck: Normal range of motion. Neck supple. No JVD present. No tracheal deviation present.  No thyromegaly present.  Cardiovascular: Normal rate, regular rhythm, normal heart sounds and intact distal pulses.  Exam reveals no gallop and no friction rub.   No murmur heard. Pulmonary/Chest: Breath sounds normal. No respiratory distress. He has no wheezes. He has no rales.  Abdominal: Soft. Bowel sounds are normal. He exhibits no mass. There is no hepatosplenomegaly. There is no tenderness. There is no rebound, no guarding and no CVA tenderness.  Musculoskeletal: Normal range of motion. He exhibits no edema or tenderness.  Lymphadenopathy:    He has no cervical adenopathy.  Neurological: He is alert and oriented to person, place, and time. He has normal sensation, normal strength, normal reflexes and intact cranial nerves. No cranial nerve deficit.  Skin: Skin is warm. No rash noted.  Psychiatric: Mood and affect normal.  Nursing note and vitals reviewed.     Assessment & Plan  Problem List Items Addressed This Visit      Respiratory   Centrilobular Emphysema - Primary   Relevant Medications   azithromycin (ZITHROMAX) 250 MG tablet    Other Visit Diagnoses    Bronchiolitis        Relevant Medications    azithromycin (ZITHROMAX) 250 MG tablet         Dr. Larene [facility]  07/16/2015

## 2015-07-16 NOTE — Patient Instructions (Signed)

## 2015-07-18 ENCOUNTER — Other Ambulatory Visit (INDEPENDENT_AMBULATORY_CARE_PROVIDER_SITE_OTHER): Payer: PPO

## 2015-07-18 DIAGNOSIS — R7982 Elevated C-reactive protein (CRP): Secondary | ICD-10-CM

## 2015-07-18 LAB — HIGH SENSITIVITY CRP: CRP, High Sensitivity: 39.03 mg/L — ABNORMAL HIGH (ref 0.000–5.000)

## 2015-08-01 DIAGNOSIS — M5136 Other intervertebral disc degeneration, lumbar region: Secondary | ICD-10-CM | POA: Diagnosis not present

## 2015-08-01 DIAGNOSIS — M531 Cervicobrachial syndrome: Secondary | ICD-10-CM | POA: Diagnosis not present

## 2015-08-01 DIAGNOSIS — M9903 Segmental and somatic dysfunction of lumbar region: Secondary | ICD-10-CM | POA: Diagnosis not present

## 2015-08-01 DIAGNOSIS — M9901 Segmental and somatic dysfunction of cervical region: Secondary | ICD-10-CM | POA: Diagnosis not present

## 2015-08-01 DIAGNOSIS — M9905 Segmental and somatic dysfunction of pelvic region: Secondary | ICD-10-CM | POA: Diagnosis not present

## 2015-08-01 DIAGNOSIS — M461 Sacroiliitis, not elsewhere classified: Secondary | ICD-10-CM | POA: Diagnosis not present

## 2015-08-02 ENCOUNTER — Ambulatory Visit (AMBULATORY_SURGERY_CENTER): Payer: Self-pay

## 2015-08-02 VITALS — Ht 71.0 in | Wt 141.2 lb

## 2015-08-02 DIAGNOSIS — K51319 Ulcerative (chronic) rectosigmoiditis with unspecified complications: Secondary | ICD-10-CM

## 2015-08-02 MED ORDER — SUPREP BOWEL PREP KIT 17.5-3.13-1.6 GM/177ML PO SOLN: 1.0000 | Freq: Once | ORAL | Status: DC

## 2015-08-02 NOTE — Progress Notes (Signed)
No allergies to eggs or soy No past problems with anesthesia No diet meds No home oxygen  Has email and internet; declined emmi  Advised to have light breakfast on prep day

## 2015-08-09 ENCOUNTER — Ambulatory Visit (AMBULATORY_SURGERY_CENTER): Payer: PPO | Admitting: Internal Medicine

## 2015-08-09 ENCOUNTER — Encounter: Payer: Self-pay | Admitting: Internal Medicine

## 2015-08-09 VITALS — BP 132/57 | HR 56 | Temp 98.8°F | Resp 12 | Ht 71.0 in | Wt 141.0 lb

## 2015-08-09 DIAGNOSIS — K5190 Ulcerative colitis, unspecified, without complications: Secondary | ICD-10-CM | POA: Diagnosis not present

## 2015-08-09 DIAGNOSIS — K51319 Ulcerative (chronic) rectosigmoiditis with unspecified complications: Secondary | ICD-10-CM | POA: Diagnosis not present

## 2015-08-09 DIAGNOSIS — D122 Benign neoplasm of ascending colon: Secondary | ICD-10-CM

## 2015-08-09 DIAGNOSIS — D124 Benign neoplasm of descending colon: Secondary | ICD-10-CM | POA: Diagnosis not present

## 2015-08-09 DIAGNOSIS — J449 Chronic obstructive pulmonary disease, unspecified: Secondary | ICD-10-CM | POA: Diagnosis not present

## 2015-08-09 HISTORY — PX: COLONOSCOPY: SHX174

## 2015-08-09 MED ORDER — SODIUM CHLORIDE 0.9 % IV SOLN: 500.0000 mL | INTRAVENOUS | Status: DC

## 2015-08-09 NOTE — Progress Notes (Signed)
Called to room to assist during endoscopic procedure.  Patient ID and intended procedure confirmed with present staff. Received instructions for my participation in the procedure from the performing physician.  

## 2015-08-09 NOTE — Patient Instructions (Signed)
YOU HAD AN ENDOSCOPIC PROCEDURE TODAY AT [facility]:   Refer to the procedure report that was given to you for any specific questions about what was found during the examination.  If the procedure report does not answer your questions, please call your gastroenterologist to clarify.  If you requested that your care partner not be given the details of your procedure findings, then the procedure report has been included in a sealed envelope for you to review at your convenience later.  YOU SHOULD EXPECT: Some feelings of bloating in the abdomen. Passage of more gas than usual.  Walking can help get rid of the air that was put into your GI tract during the procedure and reduce the bloating. If you had a lower endoscopy (such as a colonoscopy or flexible sigmoidoscopy) you may notice spotting of blood in your stool or on the toilet paper. If you underwent a bowel prep for your procedure, you may not have a normal bowel movement for a few days.  Please Note:  You might notice some irritation and congestion in your nose or some drainage.  This is from the oxygen used during your procedure.  There is no need for concern and it should clear up in a day or so.  SYMPTOMS TO REPORT IMMEDIATELY:   Following lower endoscopy (colonoscopy or flexible sigmoidoscopy):  Excessive amounts of blood in the stool  Significant tenderness or worsening of abdominal pains  Swelling of the abdomen that is new, acute  Fever of 100F or higher    For urgent or emergent issues, a gastroenterologist can be reached at any hour by calling [phone].   DIET: Your first meal following the procedure should be a small meal and then it is ok to progress to your normal diet. Heavy or fried foods are harder to digest and may make you feel nauseous or bloated.  Likewise, meals heavy in dairy and vegetables can increase bloating.  Drink plenty of fluids but you should avoid alcoholic beverages for 24  hours.  ACTIVITY:  You should plan to take it easy for the rest of today and you should NOT DRIVE or use heavy machinery until tomorrow (because of the sedation medicines used during the test).    FOLLOW UP: Our staff will call the number listed on your records the next business day following your procedure to check on you and address any questions or concerns that you may have regarding the information given to you following your procedure. If we do not reach you, we will leave a message.  However, if you are feeling well and you are not experiencing any problems, there is no need to return our call.  We will assume that you have returned to your regular daily activities without incident.  If any biopsies were taken you will be contacted by phone or by letter within the next 1-3 weeks.  Please call us at [phone] if you have not heard about the biopsies in 3 weeks.    SIGNATURES/CONFIDENTIALITY: You and/or your care partner have signed paperwork which will be entered into your electronic medical record.  These signatures attest to the fact that that the information above on your After Visit Summary has been reviewed and is understood.  Full responsibility of the confidentiality of this discharge information lies with you and/or your care-partner.  Resume medications. Information given on polyps and hemorrhoids.

## 2015-08-09 NOTE — Op Note (Signed)
[place] Patient Name: Antonio [name] Procedure Date: 08/09/2015 3:08 PM MRN: [id number] Endoscopist: Jerene Bears , MD Age: 70 Referring MD:  Date of Birth: [birth year] Gender: Male Account #: [id number] Procedure:                Colonoscopy Indications:              Personal history of ulcerative colitis to assess                            disease activity, CRP markedly elevated Medicines:                Monitored Anesthesia Care Procedure:                Pre-Anesthesia Assessment:                           - Prior to the procedure, a History and Physical                            was performed, and patient medications and                            allergies were reviewed. The patient's tolerance of                            previous anesthesia was also reviewed. The risks                            and benefits of the procedure and the sedation                            options and risks were discussed with the patient.                            All questions were answered, and informed consent                            was obtained. Prior Anticoagulants: The patient has                            taken no previous anticoagulant or antiplatelet                            agents. ASA Grade Assessment: III - A patient with                            severe systemic disease. After reviewing the risks                            and benefits, the patient was deemed in                            satisfactory condition to undergo the procedure.  After obtaining informed consent, the colonoscope                            was passed under direct vision. Throughout the                            procedure, the patient's blood pressure, pulse, and                            oxygen saturations were monitored continuously. The                            Model PCF-H190L [id number]) scope was introduced                            through the anus and  advanced to the the terminal                            ileum. The colonoscopy was performed without                            difficulty. The patient tolerated the procedure                            well. The quality of the bowel preparation was                            good. The terminal ileum, ileocecal valve,                            appendiceal orifice, and rectum were photographed. Scope In: 3:25:17 PM Scope Out: 3:41:51 PM Scope Withdrawal Time: 0 hours 13 minutes 18 seconds  Total Procedure Duration: 0 hours 16 minutes 34 seconds  Findings:                 The digital rectal exam findings include                            hemorrhoids.                           The terminal ileum appeared normal.                           Two sessile polyps were found in the ascending                            colon. The polyps were 4 to 5 mm in size. These                            polyps were removed with a cold snare. Resection                            and retrieval were complete.  A 3 mm polyp was found in the distal descending                            colon. The polyp was sessile. The polyp was removed                            with a cold snare. Resection and retrieval were                            complete.                           Internal hemorrhoids were found during                            retroflexion. The hemorrhoids were medium-sized.                           The exam was otherwise without abnormality without                            evidence of active colitis or proctitis. Complications:            No immediate complications. Estimated Blood Loss:     Estimated blood loss: none. Impression:               - The examined portion of the ileum was normal.                           - Two 4 to 5 mm polyps in the ascending colon,                            removed with a cold snare. Resected and retrieved.                           - One 3  mm polyp in the distal descending colon,                            removed with a cold snare. Resected and retrieved.                           - Internal hemorrhoids.                           - The examination was otherwise normal. No evidence                            of active ulcerative colitis today. Recommendation:           - Patient has a contact number available for                            emergencies. The signs and symptoms of potential  delayed complications were discussed with the                            patient. Return to normal activities tomorrow.                            Written discharge instructions were provided to the                            patient.                           - Resume previous diet.                           - Continue present medications.                           - Await pathology results.                           - Repeat colonoscopy is recommended for                            surveillance. The colonoscopy date will be                            determined after pathology results from today's                            exam become available for review. Jerene Bears, MD 08/09/2015 3:48:35 PM This report has been signed electronically.

## 2015-08-09 NOTE — Progress Notes (Signed)
Report to PACU, RN, vss, BBS= Clear.  

## 2015-08-10 ENCOUNTER — Telehealth: Payer: Self-pay

## 2015-08-10 NOTE — Telephone Encounter (Signed)
  Follow up Call-  Call back number 08/09/2015  Post procedure Call Back phone  # [phone]  Permission to leave phone message Yes     Patient questions:  Busy x 2. No message left.

## 2015-08-15 ENCOUNTER — Telehealth: Payer: Self-pay | Admitting: *Deleted

## 2015-08-15 NOTE — Telephone Encounter (Signed)
Patient advised of information below. He verbalizes understanding.

## 2015-08-15 NOTE — Telephone Encounter (Signed)
-----   Message from Jerene Bears, MD sent at 08/14/2015  8:47 PM EDT ----- Regarding: FW: CRP Pt followed by me for distal UC, CRP elevated but colon recently looked good. I contacted Dr. Lake Bells, his COPD MD re: CRP being elevated. He doubted it was related to his COPD lung disease Thus, at present I do not have a good explanation for the CRP elevation He can mention it to his PCP at routine followup, but it will not be a surrogate to follow his IBD  JMP  ----- Message -----    From: Juanito Doom, MD    Sent: 08/13/2015  12:39 PM      To: Jerene Bears, MD Subject: RE: CRP                                        Not a biomarker I use for COPD, but would expect it to be high in the midst of an exacerbation.  He is typically really well controlled.  If he had been feeling worse lately regarding his COPD then it may be related.  Otherwise I'm not certain. Hope this helps. ----- Message -----    From: Jerene Bears, MD    Sent: 08/09/2015   4:26 PM      To: Juanito Doom, MD Subject: CRP                                            Ok Edwards Mr. Sanders is a mutual patient of ours. I see him for UC I checked a CRP for disease monitoring and it was very elevated. I repeated it and it was even more elevated. This prompted repeat colon to assess UC activity and it is in endoscopic remission. I wondered your thoughts re: the elevated CRP?  Any ideas from COPD standpoint? Thanks for your thoughts  Ulice Dash

## 2015-08-17 ENCOUNTER — Encounter: Payer: Self-pay | Admitting: Internal Medicine

## 2015-09-25 ENCOUNTER — Encounter: Payer: Self-pay | Admitting: *Deleted

## 2015-09-25 ENCOUNTER — Ambulatory Visit (INDEPENDENT_AMBULATORY_CARE_PROVIDER_SITE_OTHER): Payer: PPO | Admitting: Family Medicine

## 2015-09-25 VITALS — BP 110/70 | HR 80 | Ht 71.0 in | Wt 139.0 lb

## 2015-09-25 DIAGNOSIS — R202 Paresthesia of skin: Secondary | ICD-10-CM

## 2015-09-25 NOTE — Patient Instructions (Signed)
Paresthesia Paresthesia is an abnormal burning or prickling sensation. This sensation is generally felt in the hands, arms, legs, or feet. However, it may occur in any part of the body. Usually, it is not painful. The feeling may be described as:  Tingling or numbness.  Pins and needles.  Skin crawling.  Buzzing.  Limbs falling asleep.  Itching. Most people experience temporary (transient) paresthesia at some time in their lives. Paresthesia may occur when you breathe too quickly (hyperventilation). It can also occur without any apparent cause. Commonly, paresthesia occurs when pressure is placed on a nerve. The sensation quickly goes away after the pressure is removed. For some people, however, paresthesia is a long-lasting (chronic) condition that is caused by an underlying disorder. If you continue to have paresthesia, you may need further medical evaluation. HOME CARE INSTRUCTIONS Watch your condition for any changes. Taking the following actions may help to lessen any discomfort that you are feeling:  Avoid drinking alcohol.  Try acupuncture or massage to help relieve your symptoms.  Keep all follow-up visits as directed by your health care provider. This is important. SEEK MEDICAL CARE IF:  You continue to have episodes of paresthesia.  Your burning or prickling feeling gets worse when you walk.  You have pain, cramps, or dizziness.  You develop a rash. SEEK IMMEDIATE MEDICAL CARE IF:  You feel weak.  You have trouble walking or moving.  You have problems with speech, understanding, or vision.  You feel confused.  You cannot control your bladder or bowel movements.  You have numbness after an injury.  You faint.   This information is not intended to replace advice given to you by your health care provider. Make sure you discuss any questions you have with your health care provider.   Document Released: 01/17/2002 Document Revised: 06/13/2014 Document Reviewed:  01/23/2014 Elsevier Interactive Patient Education [employer].

## 2015-09-25 NOTE — Progress Notes (Signed)
Name: Antonio [name]   MRN: [id number]    DOB: 1945-09-24   Date:09/25/2015       Progress Note  Subjective  Chief Complaint  Chief Complaint  Patient presents with  . Tingling    in both hands, the first 2 fingers on each hand    Neurologic Problem  The patient's primary symptoms include focal sensory loss. The patient's pertinent negatives include no altered mental status, clumsiness, focal weakness, loss of balance, memory loss, near-syncope, slurred speech, syncope, visual change or weakness. This is a new problem. The current episode started 1 to 4 weeks ago. The neurological problem developed gradually. The problem has been waxing and waning since onset. There was upper extremity focality noted. Pertinent negatives include no abdominal pain, auditory change, aura, back pain, bladder incontinence, bowel incontinence, chest pain, confusion, diaphoresis, dizziness, fatigue, fever, headaches, light-headedness, nausea, neck pain, palpitations, shortness of breath, vertigo or vomiting. Past treatments include aspirin. The treatment provided no relief.    No problem-specific Assessment & Plan notes found for this encounter.   Past Medical History:  Diagnosis Date  . Allergy   . Calf pain   . COPD (chronic obstructive pulmonary disease) ([place])   . COPD (chronic obstructive pulmonary disease) ([place])   . Hypokalemia   . Internal hemorrhoids   . Muscle cramp   . Muscle pain   . Tubular adenoma of colon   . Ulcerative colitis [facility])     Past Surgical History:  Procedure Laterality Date  . [place]  . leg stent Left approx 45 yrs ago  . MENISCUS REPAIR Left 12/2013  . ROTATOR CUFF REPAIR Left 2007  . TONSILLECTOMY AND ADENOIDECTOMY  1957    Family History  Problem Relation Age of Onset  . Cancer Mother     leukemia  . Heart disease Father   . Colon cancer Neg Hx     Social History   Social History  . Marital status: Married    Spouse name: N/A  .  Number of children: 2  . Years of education: N/A   Occupational History  . retired    Social History Main Topics  . Smoking status: Former Smoker    Packs/day: 0.75    Years: 35.00    Types: Cigarettes, Cigars    Quit date: 06/11/2011  . Smokeless tobacco: Never Used     Comment: off and on smoker   . Alcohol use 4.2 oz/week    7 Standard drinks or equivalent per week     Comment: combination of beer, wine, liquor  . Drug use: No  . Sexual activity: Yes   Other Topics Concern  . Not on file   Social History Narrative  . No narrative on file    Allergies  Allergen Reactions  . Penicillins Rash     Review of Systems  Constitutional: Negative for chills, diaphoresis, fatigue, fever, malaise/fatigue and weight loss.  HENT: Negative for ear discharge, ear pain and sore throat.   Eyes: Negative for blurred vision.  Respiratory: Negative for cough, sputum production, shortness of breath and wheezing.   Cardiovascular: Negative for chest pain, palpitations, leg swelling and near-syncope.  Gastrointestinal: Negative for abdominal pain, blood in stool, bowel incontinence, constipation, diarrhea, heartburn, melena, nausea and vomiting.  Genitourinary: Negative for bladder incontinence, dysuria, frequency, hematuria and urgency.  Musculoskeletal: Negative for back pain, joint pain, myalgias and neck pain.  Skin: Negative for rash.  Neurological: Positive for tingling and sensory change.  Negative for dizziness, vertigo, focal weakness, syncope, weakness, light-headedness, headaches and loss of balance.  Endo/Heme/Allergies: Negative for environmental allergies and polydipsia. Does not bruise/bleed easily.  Psychiatric/Behavioral: Negative for confusion, depression, memory loss and suicidal ideas. The patient is not nervous/anxious and does not have insomnia.      Objective  Vitals:   09/25/15 1335  BP: 110/70  Pulse: 80  Weight: 139 lb (63 kg)  Height: 5' 11"  (1.803 m)     Physical Exam  Constitutional: He is oriented to person, place, and time and well-developed, well-nourished, and in no distress.  HENT:  Head: Normocephalic.  Right Ear: External ear normal.  Left Ear: External ear normal.  Nose: Nose normal.  Mouth/Throat: Oropharynx is clear and moist.  Eyes: Conjunctivae and EOM are normal. Pupils are equal, round, and reactive to light. Right eye exhibits no discharge. Left eye exhibits no discharge. No scleral icterus.  Neck: Normal range of motion. Neck supple. No JVD present. No tracheal deviation present. No thyromegaly present.  Cardiovascular: Normal rate, regular rhythm, S1 normal, S2 normal, normal heart sounds, intact distal pulses and normal pulses.  Exam reveals no gallop and no friction rub.   No murmur heard. Pulses:      Radial pulses are 2+ on the right side, and 2+ on the left side.  Pulmonary/Chest: Breath sounds normal. No respiratory distress. He has no wheezes. He has no rales.  Abdominal: Soft. Bowel sounds are normal. He exhibits no mass. There is no hepatosplenomegaly. There is no tenderness. There is no rebound, no guarding and no CVA tenderness.  Musculoskeletal: Normal range of motion. He exhibits no edema or tenderness.  Lymphadenopathy:    He has no cervical adenopathy.  Neurological: He is alert and oriented to person, place, and time. He has normal sensation, normal strength and normal reflexes. No cranial nerve deficit.  Skin: Skin is warm. No rash noted.  Psychiatric: Mood and affect normal.  Nursing note and vitals reviewed.     Assessment & Plan  Problem List Items Addressed This Visit    None    Visit Diagnoses    Paresthesia of hand, bilateral    -  Primary   carpal tunnel vs electrolyte   Relevant Orders   CBC   Renal function panel        Dr. Otilio Miu [facility]  09/25/15

## 2015-09-26 LAB — CBC
HEMATOCRIT: 46.9 % (ref 37.5–51.0)
HEMOGLOBIN: 16 g/dL (ref 12.6–17.7)
MCH: 30.9 pg (ref 26.6–33.0)
MCHC: 34.1 g/dL (ref 31.5–35.7)
MCV: 91 fL (ref 79–97)
Platelets: 242 10*3/uL (ref 150–379)
RBC: 5.18 x10E6/uL (ref 4.14–5.80)
RDW: 12.3 % (ref 12.3–15.4)
WBC: 12.1 10*3/uL — ABNORMAL HIGH (ref 3.4–10.8)

## 2015-09-26 LAB — RENAL FUNCTION PANEL
Albumin: 4.2 g/dL (ref 3.5–4.8)
BUN/Creatinine Ratio: 10 (ref 10–24)
BUN: 8 mg/dL (ref 8–27)
CO2: 23 mmol/L (ref 18–29)
Calcium: 9.4 mg/dL (ref 8.6–10.2)
Chloride: 97 mmol/L (ref 96–106)
Creatinine, Ser: 0.79 mg/dL (ref 0.76–1.27)
GFR calc Af Amer: 104 mL/min/1.73
GFR calc non Af Amer: 90 mL/min/1.73
Glucose: 89 mg/dL (ref 65–99)
Phosphorus: 2.4 mg/dL — ABNORMAL LOW (ref 2.5–4.5)
Potassium: 4.3 mmol/L (ref 3.5–5.2)
Sodium: 139 mmol/L (ref 134–144)

## 2015-09-26 MED ORDER — DOXYCYCLINE HYCLATE 100 MG PO TABS: 100.0000 mg | ORAL_TABLET | Freq: Two times a day (BID) | ORAL | 0 refills | Status: DC

## 2015-09-26 NOTE — Addendum Note (Signed)
Addended by: Fredderick Severance on: 09/26/2015 08:49 AM   Modules accepted: Orders

## 2015-10-22 ENCOUNTER — Other Ambulatory Visit: Payer: Self-pay

## 2015-10-22 DIAGNOSIS — R202 Paresthesia of skin: Secondary | ICD-10-CM

## 2015-10-24 ENCOUNTER — Encounter: Payer: Self-pay | Admitting: Internal Medicine

## 2015-10-26 ENCOUNTER — Ambulatory Visit (INDEPENDENT_AMBULATORY_CARE_PROVIDER_SITE_OTHER): Payer: PPO | Admitting: Family Medicine

## 2015-10-26 ENCOUNTER — Encounter: Payer: Self-pay | Admitting: Family Medicine

## 2015-10-26 VITALS — BP 112/64 | HR 80 | Ht 71.0 in | Wt 139.0 lb

## 2015-10-26 DIAGNOSIS — J40 Bronchitis, not specified as acute or chronic: Secondary | ICD-10-CM | POA: Diagnosis not present

## 2015-10-26 DIAGNOSIS — J449 Chronic obstructive pulmonary disease, unspecified: Secondary | ICD-10-CM | POA: Diagnosis not present

## 2015-10-26 MED ORDER — PREDNISONE 10 MG PO TABS: ORAL_TABLET | ORAL | 1 refills | Status: DC

## 2015-10-26 MED ORDER — LEVOFLOXACIN 500 MG PO TABS: 500.0000 mg | ORAL_TABLET | Freq: Every day | ORAL | 0 refills | Status: DC

## 2015-10-26 MED ORDER — ALBUTEROL SULFATE (2.5 MG/3ML) 0.083% IN NEBU: 2.5000 mg | INHALATION_SOLUTION | Freq: Four times a day (QID) | RESPIRATORY_TRACT | 12 refills | Status: DC | PRN

## 2015-10-26 MED ORDER — ALBUTEROL SULFATE (2.5 MG/3ML) 0.083% IN NEBU: 2.5000 mg | INHALATION_SOLUTION | Freq: Once | RESPIRATORY_TRACT | Status: DC

## 2015-10-26 NOTE — Progress Notes (Signed)
Name: Antonio [name]   MRN: [id number]    DOB: 1945-05-10   Date:10/26/2015       Progress Note  Subjective  Chief Complaint  Chief Complaint  Patient presents with  . Sinusitis    congestion with wheezing- finished Doxy on 8/26    Sinusitis  This is a new problem. The current episode started in the past 7 days. There has been no fever. The fever has been present for 1 to 2 days. Associated symptoms include congestion, coughing, sinus pressure and sneezing. Pertinent negatives include no chills, diaphoresis, ear pain, headaches, hoarse voice, neck pain, shortness of breath, sore throat or swollen glands. Past treatments include nothing. The treatment provided mild relief.  Cough  This is a new problem. The current episode started yesterday. The problem has been gradually worsening. The problem occurs every few minutes. The cough is productive of sputum. Pertinent negatives include no chest pain, chills, ear pain, fever, headaches, heartburn, myalgias, rash, sore throat, shortness of breath, weight loss or wheezing. Nothing aggravates the symptoms. The treatment provided mild relief. His past medical history is significant for COPD. There is no history of environmental allergies.    No problem-specific Assessment & Plan notes found for this encounter.   Past Medical History:  Diagnosis Date  . Allergy   . Calf pain   . COPD (chronic obstructive pulmonary disease) ([place])   . COPD (chronic obstructive pulmonary disease) ([place])   . Hypokalemia   . Internal hemorrhoids   . Muscle cramp   . Muscle pain   . Tubular adenoma of colon   . Ulcerative colitis [facility])     Past Surgical History:  Procedure Laterality Date  . [place]  . leg stent Left approx 45 yrs ago  . MENISCUS REPAIR Left 12/2013  . ROTATOR CUFF REPAIR Left 2007  . TONSILLECTOMY AND ADENOIDECTOMY  1957    Family History  Problem Relation Age of Onset  . Cancer Mother     leukemia  . Heart  disease Father   . Colon cancer Neg Hx     Social History   Social History  . Marital status: Married    Spouse name: N/A  . Number of children: 2  . Years of education: N/A   Occupational History  . retired    Social History Main Topics  . Smoking status: Former Smoker    Packs/day: 0.75    Years: 35.00    Types: Cigarettes, Cigars    Quit date: 06/11/2011  . Smokeless tobacco: Never Used     Comment: off and on smoker   . Alcohol use 4.2 oz/week    7 Standard drinks or equivalent per week     Comment: combination of beer, wine, liquor  . Drug use: No  . Sexual activity: Yes   Other Topics Concern  . Not on file   Social History Narrative  . No narrative on file    Allergies  Allergen Reactions  . Penicillins Rash     Review of Systems  Constitutional: Negative for chills, diaphoresis, fever, malaise/fatigue and weight loss.  HENT: Positive for congestion, sinus pressure and sneezing. Negative for ear discharge, ear pain, hoarse voice and sore throat.   Eyes: Negative for blurred vision.  Respiratory: Positive for cough. Negative for sputum production, shortness of breath and wheezing.   Cardiovascular: Negative for chest pain, palpitations and leg swelling.  Gastrointestinal: Negative for abdominal pain, blood in stool, constipation, diarrhea, heartburn, melena  and nausea.  Genitourinary: Negative for dysuria, frequency, hematuria and urgency.  Musculoskeletal: Negative for back pain, joint pain, myalgias and neck pain.  Skin: Negative for rash.  Neurological: Negative for dizziness, tingling, sensory change, focal weakness and headaches.  Endo/Heme/Allergies: Negative for environmental allergies and polydipsia. Does not bruise/bleed easily.  Psychiatric/Behavioral: Negative for depression and suicidal ideas. The patient is not nervous/anxious and does not have insomnia.      Objective  Vitals:   10/26/15 1445  BP: 112/64  Pulse: 80  SpO2: 95%  Weight:  139 lb (63 kg)  Height: 5' 11"  (1.803 m)    Physical Exam  Constitutional: He is oriented to person, place, and time and well-developed, well-nourished, and in no distress.  HENT:  Head: Normocephalic.  Right Ear: External ear normal.  Left Ear: External ear normal.  Nose: Nose normal.  Mouth/Throat: Oropharynx is clear and moist.  Eyes: Conjunctivae and EOM are normal. Pupils are equal, round, and reactive to light. Right eye exhibits no discharge. Left eye exhibits no discharge. No scleral icterus.  Neck: Normal range of motion. Neck supple. No JVD present. No tracheal deviation present. No thyromegaly present.  Cardiovascular: Normal rate, regular rhythm, normal heart sounds and intact distal pulses.  Exam reveals no gallop and no friction rub.   No murmur heard. Pulmonary/Chest: Breath sounds normal. No respiratory distress. He has no wheezes. He has no rales.  Abdominal: Soft. Bowel sounds are normal. He exhibits no mass. There is no hepatosplenomegaly. There is no tenderness. There is no rebound, no guarding and no CVA tenderness.  Musculoskeletal: Normal range of motion. He exhibits no edema or tenderness.  Lymphadenopathy:    He has no cervical adenopathy.  Neurological: He is alert and oriented to person, place, and time. He has normal sensation, normal strength, normal reflexes and intact cranial nerves. No cranial nerve deficit.  Skin: Skin is warm. No rash noted.  Psychiatric: Mood and affect normal.  Nursing note and vitals reviewed.     Assessment & Plan  Problem List Items Addressed This Visit      Respiratory   Centrilobular Emphysema - Primary   Relevant Medications   albuterol (PROVENTIL) (2.5 MG/3ML) 0.083% nebulizer solution 2.5 mg   predniSONE (DELTASONE) 10 MG tablet   albuterol (PROVENTIL) (2.5 MG/3ML) 0.083% nebulizer solution    Other Visit Diagnoses    Bronchitis       Relevant Medications   albuterol (PROVENTIL) (2.5 MG/3ML) 0.083% nebulizer  solution 2.5 mg        Dr. [name]  10/26/15

## 2015-11-07 DIAGNOSIS — H2513 Age-related nuclear cataract, bilateral: Secondary | ICD-10-CM | POA: Diagnosis not present

## 2015-11-26 ENCOUNTER — Ambulatory Visit (INDEPENDENT_AMBULATORY_CARE_PROVIDER_SITE_OTHER): Payer: PPO | Admitting: Family Medicine

## 2015-11-26 ENCOUNTER — Encounter: Payer: Self-pay | Admitting: Family Medicine

## 2015-11-26 VITALS — BP 120/62 | HR 68 | Ht 71.0 in | Wt 142.0 lb

## 2015-11-26 DIAGNOSIS — Z0000 Encounter for general adult medical examination without abnormal findings: Secondary | ICD-10-CM | POA: Diagnosis not present

## 2015-11-26 DIAGNOSIS — J029 Acute pharyngitis, unspecified: Secondary | ICD-10-CM

## 2015-11-26 DIAGNOSIS — Z23 Encounter for immunization: Secondary | ICD-10-CM | POA: Diagnosis not present

## 2015-11-26 DIAGNOSIS — Z1159 Encounter for screening for other viral diseases: Secondary | ICD-10-CM

## 2015-11-26 LAB — POCT URINALYSIS DIPSTICK
BILIRUBIN UA: NEGATIVE
Blood, UA: NEGATIVE
GLUCOSE UA: NEGATIVE
Ketones, UA: NEGATIVE
LEUKOCYTES UA: NEGATIVE
NITRITE UA: NEGATIVE
PH UA: 6
Protein, UA: NEGATIVE
Spec Grav, UA: 1.01
Urobilinogen, UA: 0.2

## 2015-11-26 LAB — HEMOCCULT GUIAC POC 1CARD (OFFICE): Fecal Occult Blood, POC: NEGATIVE

## 2015-11-26 MED ORDER — AZITHROMYCIN 250 MG PO TABS: ORAL_TABLET | ORAL | 0 refills | Status: DC

## 2015-11-26 NOTE — Progress Notes (Signed)
Name: Antonio [name]   MRN: [id number]    DOB: [birth year]   Date:11/26/2015       Progress Note  Subjective  Chief Complaint  Chief Complaint  Patient presents with  . Annual Exam    needs labs checked  . Sore Throat    wife has had a cold    Patient present annual physical exam.    Sore Throat   Pertinent negatives include no abdominal pain, coughing, diarrhea, ear discharge, ear pain, headaches, neck pain or shortness of breath.    No problem-specific Assessment & Plan notes found for this encounter.   Past Medical History:  Diagnosis Date  . Allergy   . Calf pain   . COPD (chronic obstructive pulmonary disease) ([place])   . COPD (chronic obstructive pulmonary disease) ([place])   . Hypokalemia   . Internal hemorrhoids   . Muscle cramp   . Muscle pain   . Tubular adenoma of colon   . Ulcerative colitis [facility])     Past Surgical History:  Procedure Laterality Date  . [place]  . leg stent Left approx 45 yrs ago  . MENISCUS REPAIR Left 12/2013  . ROTATOR CUFF REPAIR Left 2007  . TONSILLECTOMY AND ADENOIDECTOMY  1957    Family History  Problem Relation Age of Onset  . Cancer Mother     leukemia  . Heart disease Father   . Colon cancer Neg Hx     Social History   Social History  . Marital status: Married    Spouse name: N/A  . Number of children: 2  . Years of education: N/A   Occupational History  . retired    Social History Main Topics  . Smoking status: Former Smoker    Packs/day: 0.75    Years: 35.00    Types: Cigarettes, Cigars    Quit date: 06/11/2011  . Smokeless tobacco: Never Used     Comment: off and on smoker   . Alcohol use 4.2 oz/week    7 Standard drinks or equivalent per week     Comment: combination of beer, wine, liquor  . Drug use: No  . Sexual activity: Yes   Other Topics Concern  . Not on file   Social History Narrative  . No narrative on file    Allergies  Allergen Reactions  . Penicillins Rash      Review of Systems  Constitutional: Negative for chills, diaphoresis, fever, malaise/fatigue and weight loss.  HENT: Negative for ear discharge, ear pain and sore throat.   Eyes: Negative for blurred vision, photophobia, pain and discharge.  Respiratory: Negative for cough, sputum production, shortness of breath and wheezing.   Cardiovascular: Negative for chest pain, palpitations and leg swelling.  Gastrointestinal: Negative for abdominal pain, blood in stool, constipation, diarrhea, heartburn, melena and nausea.  Genitourinary: Negative for dysuria, frequency, hematuria and urgency.  Musculoskeletal: Negative for back pain, joint pain, myalgias and neck pain.  Skin: Negative for rash.  Neurological: Negative for dizziness, tingling, sensory change, focal weakness, weakness and headaches.  Endo/Heme/Allergies: Negative for environmental allergies and polydipsia. Does not bruise/bleed easily.  Psychiatric/Behavioral: Negative for depression and suicidal ideas. The patient is not nervous/anxious and does not have insomnia.      Objective  Vitals:   11/26/15 0828  BP: 120/62  Pulse: 68  Weight: 142 lb (64.4 kg)  Height: 5' 11"  (1.803 m)    Physical Exam  Constitutional: He is oriented to person, place, and  time and well-developed, well-nourished, and in no distress.  HENT:  Head: Normocephalic.  Right Ear: Tympanic membrane, external ear and ear canal normal.  Left Ear: Tympanic membrane, external ear and ear canal normal.  Nose: Nose normal.  Mouth/Throat: Uvula is midline, oropharynx is clear and moist and mucous membranes are normal.  Eyes: Conjunctivae, EOM and lids are normal. Pupils are equal, round, and reactive to light. Right eye exhibits no discharge. Left eye exhibits no discharge. No scleral icterus.  Fundoscopic exam:      The right eye shows no arteriolar narrowing and no AV nicking.       The left eye shows no arteriolar narrowing, no AV nicking and no  papilledema.  Neck: Trachea normal, normal range of motion, full passive range of motion without pain and phonation normal. Neck supple. Normal carotid pulses, no hepatojugular reflux and no JVD present. No spinous process tenderness and no muscular tenderness present. Carotid bruit is not present. No tracheal deviation, no edema and no erythema present. No thyroid mass and no thyromegaly present.  Cardiovascular: Normal rate, regular rhythm, S1 normal, S2 normal, normal heart sounds and intact distal pulses.  Exam reveals no gallop, no S3, no S4 and no friction rub.   No murmur heard. Pulses:      Carotid pulses are 2+ on the right side, and 2+ on the left side.      Radial pulses are 2+ on the right side, and 2+ on the left side.       Femoral pulses are 1+ on the right side, and 1+ on the left side.      Popliteal pulses are 1+ on the right side, and 1+ on the left side.       Dorsalis pedis pulses are 0 on the right side, and 0 on the left side.       Posterior tibial pulses are 0 on the right side, and 0 on the left side.  Pulmonary/Chest: Effort normal and breath sounds normal. No accessory muscle usage. No respiratory distress. He has no wheezes. He has no rales. Right breast exhibits no mass. Left breast exhibits no mass.  Abdominal: Soft. Normal aorta and bowel sounds are normal. He exhibits no distension and no mass. There is no hepatosplenomegaly. There is no tenderness. There is no rigidity, no rebound, no guarding and no CVA tenderness.  Genitourinary: Rectum normal, prostate normal, testes/scrotum normal and penis normal. Rectal exam shows no external hemorrhoid. He exhibits no epididymal tenderness.  Musculoskeletal: Normal range of motion. He exhibits no edema or tenderness.  Lymphadenopathy:       Head (right side): No submental and no submandibular adenopathy present.       Head (left side): No submental and no submandibular adenopathy present.    He has no cervical adenopathy.     He has no axillary adenopathy.       Right: No inguinal and no supraclavicular adenopathy present.       Left: No inguinal and no supraclavicular adenopathy present.  Neurological: He is alert and oriented to person, place, and time. He has normal motor skills, normal sensation, normal strength, normal reflexes and intact cranial nerves. No cranial nerve deficit.  Reflex Scores:      Tricep reflexes are 2+ on the right side and 2+ on the left side.      Bicep reflexes are 2+ on the right side and 2+ on the left side.      Brachioradialis reflexes are  2+ on the right side and 2+ on the left side.      Patellar reflexes are 2+ on the right side and 2+ on the left side.      Achilles reflexes are 2+ on the right side and 2+ on the left side. Skin: Skin is warm, dry and intact. No rash noted. No cyanosis. Nails show no clubbing.  Psychiatric: Mood, memory, affect and judgment normal.  Nursing note and vitals reviewed.     Assessment & Plan  Problem List Items Addressed This Visit    None    Visit Diagnoses    Annual physical exam    -  Primary   Relevant Orders   POCT urinalysis dipstick (Completed)   POCT Occult Blood Stool (Completed)   COMPLETE METABOLIC PANEL WITH GFR   PSA   CBC   Pharyngitis, unspecified etiology       Need for hepatitis C screening test       Relevant Orders   Hepatitis C antibody   Immunization due       Relevant Orders   Flu Vaccine QUAD 36+ mos PF IM (Fluarix & Fluzone Quad PF) (Completed)   Pneumococcal polysaccharide vaccine 23-valent greater than or equal to 2yo subcutaneous/IM (Completed)        Dr. [facility]  11/26/15

## 2015-11-27 ENCOUNTER — Other Ambulatory Visit: Payer: Self-pay

## 2015-11-27 LAB — CMP14+EGFR
ALK PHOS: 87 IU/L (ref 39–117)
ALT: 17 IU/L (ref 0–44)
AST: 18 IU/L (ref 0–40)
Albumin/Globulin Ratio: 1.5 (ref 1.2–2.2)
Albumin: 4.4 g/dL (ref 3.5–4.8)
BUN/Creatinine Ratio: 13 (ref 10–24)
BUN: 11 mg/dL (ref 8–27)
Bilirubin Total: 0.8 mg/dL (ref 0.0–1.2)
CO2: 27 mmol/L (ref 18–29)
CREATININE: 0.87 mg/dL (ref 0.76–1.27)
Calcium: 9.5 mg/dL (ref 8.6–10.2)
Chloride: 102 mmol/L (ref 96–106)
GFR calc Af Amer: 100 mL/min/{1.73_m2} (ref >59)
GFR calc non Af Amer: 86 mL/min/{1.73_m2} (ref >59)
GLOBULIN, TOTAL: 2.9 g/dL (ref 1.5–4.5)
Glucose: 88 mg/dL (ref 65–99)
POTASSIUM: 4.6 mmol/L (ref 3.5–5.2)
SODIUM: 143 mmol/L (ref 134–144)
Total Protein: 7.3 g/dL (ref 6.0–8.5)

## 2015-11-27 LAB — CBC
Hematocrit: 43.9 % (ref 37.5–51.0)
Hemoglobin: 15.2 g/dL (ref 12.6–17.7)
MCH: 31.1 pg (ref 26.6–33.0)
MCHC: 34.6 g/dL (ref 31.5–35.7)
MCV: 90 fL (ref 79–97)
PLATELETS: 247 10*3/uL (ref 150–379)
RBC: 4.89 x10E6/uL (ref 4.14–5.80)
RDW: 13.6 % (ref 12.3–15.4)
WBC: 7.8 10*3/uL (ref 3.4–10.8)

## 2015-11-27 LAB — HEPATITIS C ANTIBODY: Hep C Virus Ab: 0.1 s/co ratio (ref 0.0–0.9)

## 2015-11-27 LAB — PSA: PROSTATE SPECIFIC AG, SERUM: 0.5 ng/mL (ref 0.0–4.0)

## 2015-12-03 ENCOUNTER — Other Ambulatory Visit: Payer: Self-pay

## 2015-12-03 DIAGNOSIS — J40 Bronchitis, not specified as acute or chronic: Secondary | ICD-10-CM

## 2015-12-03 MED ORDER — LEVOFLOXACIN 500 MG PO TABS: 500.0000 mg | ORAL_TABLET | Freq: Every day | ORAL | 0 refills | Status: DC

## 2015-12-03 MED ORDER — BENZONATATE 100 MG PO CAPS: 100.0000 mg | ORAL_CAPSULE | Freq: Two times a day (BID) | ORAL | 0 refills | Status: DC | PRN

## 2015-12-06 DIAGNOSIS — G5603 Carpal tunnel syndrome, bilateral upper limbs: Secondary | ICD-10-CM | POA: Diagnosis not present

## 2015-12-11 ENCOUNTER — Other Ambulatory Visit: Payer: Self-pay

## 2015-12-17 ENCOUNTER — Other Ambulatory Visit: Payer: Self-pay

## 2015-12-17 DIAGNOSIS — R202 Paresthesia of skin: Secondary | ICD-10-CM

## 2015-12-24 ENCOUNTER — Encounter: Payer: Self-pay | Admitting: Pulmonary Disease

## 2015-12-24 ENCOUNTER — Ambulatory Visit (INDEPENDENT_AMBULATORY_CARE_PROVIDER_SITE_OTHER): Payer: PPO | Admitting: Pulmonary Disease

## 2015-12-24 VITALS — BP 122/60 | HR 82 | Ht 71.0 in | Wt 141.6 lb

## 2015-12-24 DIAGNOSIS — J441 Chronic obstructive pulmonary disease with (acute) exacerbation: Secondary | ICD-10-CM

## 2015-12-24 DIAGNOSIS — J449 Chronic obstructive pulmonary disease, unspecified: Secondary | ICD-10-CM | POA: Diagnosis not present

## 2015-12-24 MED ORDER — INDACATEROL-GLYCOPYRROLATE 27.5-15.6 MCG IN CAPS: 1.0000 | ORAL_CAPSULE | Freq: Every day | RESPIRATORY_TRACT | 0 refills | Status: DC

## 2015-12-24 MED ORDER — FLUTTER DEVI: 0 refills | Status: DC

## 2015-12-24 MED ORDER — PREDNISONE 20 MG PO TABS: 20.0000 mg | ORAL_TABLET | Freq: Every day | ORAL | 0 refills | Status: DC

## 2015-12-24 NOTE — Assessment & Plan Note (Signed)
I am concerned about 1 and his severe COPD. He recently had an exacerbation which is still causing persistent symptoms 3 weeks after its start. This is despite treatment with prednisone and an antibiotic. I advised him today that this can cause his severe COPD to worsen and is a bad sign.  Because he's only had one this year I don't strongly suspect an underlying infection, but we need to be mindful of that possibility if he has another exacerbation soon.  He is interested in trying a combination long acting beta agonist and muscular chronic antagonist today.  He is a bit deconditioned and not exercising as much after this episode so he would benefit from pulmonary rehabilitation.  Plan summary: Prednisone 20 mg daily for the next 5 days Use Mucinex over-the-counter for the next week to 2 weeks Use a flutter valve regularly Stop Symbicort Use Utibron (samples given)  Exercise more over the next few weeks Return in 4 weeks for a visit with the nurse practitioner and simple spirometry testing, at that point refer to pulmonary rehabilitation in [place] F/U in 4 weeks or sooner if needed

## 2015-12-24 NOTE — Patient Instructions (Signed)
Take the prednisone as prescribed Use the flutter valve 4-5 breaths, 4-5 times a day Use generic guaifenesin 1200 mg twice a day for the next 7 days Try taking Utibron daily instead of Symbicort We will see you back in 4 weeks with a simple spirometry test, after that we will refer you to pulmonary rehabilitation

## 2015-12-24 NOTE — Progress Notes (Signed)
Subjective:    Patient ID: Antonio [name], male    DOB: Feb 01, 1946, 70 y.o.   MRN: [id number]  Synopsis: He was referred for shortness of breath in 2016. He had a lengthy smoking history but quit smoking in 2014. April 2016 pulmonary function testing confirmed COPD which was severe with an FEV1 of 1.47 L (40% predicted).  HPI Chief Complaint  Patient presents with  . Follow-up    pt c/o hoarseness, increased mucus production, PND, runny nose worse qam.     Antonio [name] says that he had a bad flare up of his COPD a few weeks ago.  He was seen by his PCP 2-3 weeks ago due ot increasing chest congestion, dyspnea and cough.  This occurred right after working out in his yard with leaves and mowing the grass.  He doesn't recall having sinus symptoms then or now.  His chest congestion has improved but is still a problem.  Some mild sinus congestion now in th emornings, very mild.  He was taking mucinex and zyrtec.  He is no longer taking the mucinex.    His wife says that he is experiencing more dyspnea with small chores.    He has also been experiencing hoarseness over the last few months as well.     Past Medical History:  Diagnosis Date  . Allergy   . Calf pain   . COPD (chronic obstructive pulmonary disease) ([place])   . COPD (chronic obstructive pulmonary disease) ([place])   . Hypokalemia   . Internal hemorrhoids   . Muscle cramp   . Muscle pain   . Tubular adenoma of colon   . Ulcerative colitis ([place])       Review of Systems  Constitutional: Negative for chills, fatigue and fever.  HENT: Negative for postnasal drip, rhinorrhea and sinus pain.   Respiratory: Positive for cough, shortness of breath and wheezing.   Cardiovascular: Negative for chest pain, palpitations and leg swelling.       Objective:   Physical Exam  Vitals:   12/24/15 1027  BP: 122/60  Pulse: 82  SpO2: 95%  Weight: 141 lb 9.6 oz (64.2 kg)  Height: 5' 11"  (1.803 m)   RA  Gen: well appearing HENT: OP  clear, TM's clear, neck supple PULM: CTA B, normal effort CV: RRR, no mgr, trace edema GI: BS+, soft, nontender Derm: no cyanosis or rash Psyche: normal mood and affect  CBC    Component Value Date/Time   WBC 7.8 11/26/2015 0933   WBC 8.6 07/03/2015 0943   RBC 4.89 11/26/2015 0933   RBC 5.08 07/03/2015 0943   HGB 15.8 07/03/2015 0943   HGB 16.1 10/01/2013 2131   HCT 43.9 11/26/2015 0933   PLT 247 11/26/2015 0933   MCV 90 11/26/2015 0933   MCV 95 10/01/2013 2131   MCH 31.1 11/26/2015 0933   MCH 32.0 10/01/2013 2131   MCHC 34.6 11/26/2015 0933   MCHC 33.6 07/03/2015 0943   RDW 13.6 11/26/2015 0933   RDW 12.4 10/01/2013 2131   LYMPHSABS 1.2 07/03/2015 0943   MONOABS 1.2 (H) 07/03/2015 0943   EOSABS 0.3 07/03/2015 0943   BASOSABS 0.1 07/03/2015 0943    Records from Dr. Hilarie Fredrickson reviewed were he was cared for for his ulcerative colitis     Assessment & Plan:   Centrilobular Emphysema I am concerned about 1 and his severe COPD. He recently had an exacerbation which is still causing persistent symptoms 3 weeks after its start. This is  despite treatment with prednisone and an antibiotic. I advised him today that this can cause his severe COPD to worsen and is a bad sign.  Because he's only had one this year I don't strongly suspect an underlying infection, but we need to be mindful of that possibility if he has another exacerbation soon.  He is interested in trying a combination long acting beta agonist and muscular chronic antagonist today.  He is a bit deconditioned and not exercising as much after this episode so he would benefit from pulmonary rehabilitation.  Plan summary: Prednisone 20 mg daily for the next 5 days Use Mucinex over-the-counter for the next week to 2 weeks Use a flutter valve regularly Stop Symbicort Use Utibron (samples given)  Exercise more over the next few weeks Return in 4 weeks for a visit with the nurse practitioner and simple spirometry testing,  at that point refer to pulmonary rehabilitation in [place] F/U in 4 weeks or sooner if needed   Updated Medication List Outpatient Encounter Prescriptions as of 12/24/2015  Medication Sig  . albuterol (PROVENTIL HFA;VENTOLIN HFA) 108 (90 BASE) MCG/ACT inhaler Inhale 2 puffs into the lungs every 6 (six) hours as needed for wheezing or shortness of breath.  Marland Kitchen albuterol (PROVENTIL) (2.5 MG/3ML) 0.083% nebulizer solution Take 3 mLs (2.5 mg total) by nebulization every 6 (six) hours as needed for wheezing or shortness of breath.  . Ascorbic Acid (VITAMIN C) 100 MG tablet Take 100 mg by mouth daily.  Marland Kitchen aspirin 81 MG tablet Take 81 mg by mouth daily.  . benzonatate (TESSALON) 100 MG capsule Take 1 capsule (100 mg total) by mouth 2 (two) times daily as needed for cough.  . budesonide-formoterol (SYMBICORT) 80-4.5 MCG/ACT inhaler Inhale 2 puffs into the lungs 2 (two) times daily.  Marland Kitchen EPINEPHrine 0.3 mg/0.3 mL IJ SOAJ injection Inject 0.3 mLs (0.3 mg total) into the muscle once.  . loperamide (IMODIUM) 2 MG capsule Take by mouth as needed for diarrhea or loose stools.  . mesalamine (LIALDA) 1.2 g EC tablet Take 3 tablets (3.6 g total) by mouth daily with breakfast. (Patient taking differently: Take 2.4 g by mouth daily with breakfast. )  . Multiple Vitamin (MULTIVITAMIN WITH MINERALS) TABS tablet Take 1 tablet by mouth daily.  Marland Kitchen POTASSIUM & SODIUM PHOSPHATES PO Take 1 tablet by mouth daily.  Marland Kitchen Spacer/Aero-Holding Chambers (AEROCHAMBER MV) inhaler Use as instructed  . vitamin E 400 UNIT capsule Take 400 Units by mouth daily.  . Indacaterol-Glycopyrrolate (UTIBRON NEOHALER) 27.5-15.6 MCG CAPS Place 1 puff into inhaler and inhale daily.  . predniSONE (DELTASONE) 20 MG tablet Take 1 tablet (20 mg total) by mouth daily with breakfast.  . Respiratory Therapy Supplies (FLUTTER) DEVI Use as directed  . [DISCONTINUED] levofloxacin (LEVAQUIN) 500 MG tablet Take 1 tablet (500 mg total) by mouth daily. (Patient  not taking: Reported on 12/24/2015)   Facility-Administered Encounter Medications as of 12/24/2015  Medication  . albuterol (PROVENTIL) (2.5 MG/3ML) 0.083% nebulizer solution 2.5 mg

## 2015-12-26 DIAGNOSIS — M222X2 Patellofemoral disorders, left knee: Secondary | ICD-10-CM | POA: Diagnosis not present

## 2015-12-26 DIAGNOSIS — S8002XA Contusion of left knee, initial encounter: Secondary | ICD-10-CM | POA: Diagnosis not present

## 2016-01-10 DIAGNOSIS — R252 Cramp and spasm: Secondary | ICD-10-CM | POA: Insufficient documentation

## 2016-01-10 DIAGNOSIS — G5603 Carpal tunnel syndrome, bilateral upper limbs: Secondary | ICD-10-CM | POA: Diagnosis not present

## 2016-01-21 DIAGNOSIS — M9905 Segmental and somatic dysfunction of pelvic region: Secondary | ICD-10-CM | POA: Diagnosis not present

## 2016-01-21 DIAGNOSIS — M5136 Other intervertebral disc degeneration, lumbar region: Secondary | ICD-10-CM | POA: Diagnosis not present

## 2016-01-21 DIAGNOSIS — M9903 Segmental and somatic dysfunction of lumbar region: Secondary | ICD-10-CM | POA: Diagnosis not present

## 2016-01-21 DIAGNOSIS — M461 Sacroiliitis, not elsewhere classified: Secondary | ICD-10-CM | POA: Diagnosis not present

## 2016-01-21 DIAGNOSIS — M531 Cervicobrachial syndrome: Secondary | ICD-10-CM | POA: Diagnosis not present

## 2016-01-21 DIAGNOSIS — M9901 Segmental and somatic dysfunction of cervical region: Secondary | ICD-10-CM | POA: Diagnosis not present

## 2016-01-22 ENCOUNTER — Encounter: Payer: Self-pay | Admitting: Pulmonary Disease

## 2016-01-22 ENCOUNTER — Ambulatory Visit (INDEPENDENT_AMBULATORY_CARE_PROVIDER_SITE_OTHER): Payer: PPO | Admitting: Pulmonary Disease

## 2016-01-22 VITALS — BP 134/66 | HR 82 | Ht 71.0 in | Wt 150.0 lb

## 2016-01-22 DIAGNOSIS — J449 Chronic obstructive pulmonary disease, unspecified: Secondary | ICD-10-CM

## 2016-01-22 MED ORDER — INDACATEROL-GLYCOPYRROLATE 27.5-15.6 MCG IN CAPS: 2.0000 | ORAL_CAPSULE | Freq: Every day | RESPIRATORY_TRACT | 0 refills | Status: DC

## 2016-01-22 NOTE — Progress Notes (Signed)
Subjective:    Patient ID: Antonio [name], male    DOB: 13-Feb-1945, 70 y.o.   MRN: [id number]  Synopsis: He was referred for shortness of breath in 2016. He had a lengthy smoking history but quit smoking in 2014. April 2016 pulmonary function testing confirmed COPD which was severe with an FEV1 of 1.47 L (40% predicted). December 2017 lung function testing showed ratio 50%, FEV1 1.47 L 43% predicted, FVC 2.96 L 67% predicted  HPI Chief Complaint  Patient presents with  . Follow-up    Pt doing well on utibron, minimally using rescue inhaler now.     He feels like the Utibron is helping a lot and he has not needed the rescue inhaler at all. He is using the Utibron once daily, no dry mouth or eye difficulty.  His breathing has improved significantly.  No coughing, no chest congestion, no wheezing, no phlegm production.  Climbing stairs is OK again.    Past Medical History:  Diagnosis Date  . Allergy   . Calf pain   . COPD (chronic obstructive pulmonary disease) ([place])   . COPD (chronic obstructive pulmonary disease) ([place])   . Hypokalemia   . Internal hemorrhoids   . Muscle cramp   . Muscle pain   . Tubular adenoma of colon   . Ulcerative colitis ([place])       Review of Systems  Constitutional: Negative for chills, fatigue and fever.  HENT: Negative for postnasal drip, rhinorrhea and sinus pain.   Respiratory: Positive for cough, shortness of breath and wheezing.   Cardiovascular: Negative for chest pain, palpitations and leg swelling.       Objective:   Physical Exam  Vitals:   01/22/16 1417  BP: 134/66  Pulse: 82  SpO2: 97%  Weight: 150 lb (68 kg)  Height: 5' 11"  (1.803 m)   RA  Gen: well appearing HENT: OP clear, TM's clear, neck supple PULM: CTA B, normal effort CV: RRR, no mgr, trace edema GI: BS+, soft, nontender Derm: no cyanosis or rash Psyche: normal mood and affect       Assessment & Plan:   Impression: COPD Prior tobacco use COPD  exacerbation  Discussion: Surprisingly, his simple spirometry today is unchanged compared to his 2016 result. He has done really well with a combination long acting beta agonist and long-acting muscarinic antagonist. We will continue with that medicine for now.  He was counseled today length on the significance of COPD exacerbations and the importance of hand hygiene. His immunizations are up-to-date.  For your COPD: Continue the Utibron inhaler 2 puffs daily Use albuterol as needed for shortness of breath If you have an exacerbation of your COPD (cough, congestion, shortness of breath) and please let us know right away We will see you back in 6 months or sooner if needed  Updated Medication List Outpatient Encounter Prescriptions as of 01/22/2016  Medication Sig  . albuterol (PROVENTIL HFA;VENTOLIN HFA) 108 (90 BASE) MCG/ACT inhaler Inhale 2 puffs into the lungs every 6 (six) hours as needed for wheezing or shortness of breath.  Marland Kitchen albuterol (PROVENTIL) (2.5 MG/3ML) 0.083% nebulizer solution Take 3 mLs (2.5 mg total) by nebulization every 6 (six) hours as needed for wheezing or shortness of breath.  . Ascorbic Acid (VITAMIN C) 100 MG tablet Take 100 mg by mouth daily.  Marland Kitchen aspirin 81 MG tablet Take 81 mg by mouth daily.  . benzonatate (TESSALON) 100 MG capsule Take 1 capsule (100 mg total) by mouth 2 (  two) times daily as needed for cough.  . EPINEPHrine 0.3 mg/0.3 mL IJ SOAJ injection Inject 0.3 mLs (0.3 mg total) into the muscle once.  . Indacaterol-Glycopyrrolate (UTIBRON NEOHALER) 27.5-15.6 MCG CAPS Place 1 puff into inhaler and inhale daily.  Marland Kitchen loperamide (IMODIUM) 2 MG capsule Take by mouth as needed for diarrhea or loose stools.  . mesalamine (LIALDA) 1.2 g EC tablet Take 3 tablets (3.6 g total) by mouth daily with breakfast. (Patient taking differently: Take 2.4 g by mouth daily with breakfast. )  . Multiple Vitamin (MULTIVITAMIN WITH MINERALS) TABS tablet Take 1 tablet by mouth daily.    Marland Kitchen POTASSIUM & SODIUM PHOSPHATES PO Take 1 tablet by mouth daily.  Marland Kitchen Respiratory Therapy Supplies (FLUTTER) DEVI Use as directed  . Spacer/Aero-Holding Chambers (AEROCHAMBER MV) inhaler Use as instructed  . vitamin E 400 UNIT capsule Take 400 Units by mouth daily.  . [DISCONTINUED] budesonide-formoterol (SYMBICORT) 80-4.5 MCG/ACT inhaler Inhale 2 puffs into the lungs 2 (two) times daily. (Patient not taking: Reported on 01/22/2016)  . [DISCONTINUED] predniSONE (DELTASONE) 20 MG tablet Take 1 tablet (20 mg total) by mouth daily with breakfast. (Patient not taking: Reported on 01/22/2016)   Facility-Administered Encounter Medications as of 01/22/2016  Medication  . albuterol (PROVENTIL) (2.5 MG/3ML) 0.083% nebulizer solution 2.5 mg

## 2016-01-22 NOTE — Patient Instructions (Signed)
For your COPD: Continue the Utibron inhaler 2 puffs daily Use albuterol as needed for shortness of breath If you have an exacerbation of your COPD (cough, congestion, shortness of breath) and please let us know right away We will see you back in 6 months or sooner if need

## 2016-01-23 DIAGNOSIS — M531 Cervicobrachial syndrome: Secondary | ICD-10-CM | POA: Diagnosis not present

## 2016-01-23 DIAGNOSIS — M9901 Segmental and somatic dysfunction of cervical region: Secondary | ICD-10-CM | POA: Diagnosis not present

## 2016-01-23 DIAGNOSIS — M5136 Other intervertebral disc degeneration, lumbar region: Secondary | ICD-10-CM | POA: Diagnosis not present

## 2016-01-23 DIAGNOSIS — M9903 Segmental and somatic dysfunction of lumbar region: Secondary | ICD-10-CM | POA: Diagnosis not present

## 2016-01-23 DIAGNOSIS — M461 Sacroiliitis, not elsewhere classified: Secondary | ICD-10-CM | POA: Diagnosis not present

## 2016-01-23 DIAGNOSIS — M9905 Segmental and somatic dysfunction of pelvic region: Secondary | ICD-10-CM | POA: Diagnosis not present

## 2016-01-25 ENCOUNTER — Telehealth: Payer: Self-pay | Admitting: Pulmonary Disease

## 2016-01-25 DIAGNOSIS — M9903 Segmental and somatic dysfunction of lumbar region: Secondary | ICD-10-CM | POA: Diagnosis not present

## 2016-01-25 DIAGNOSIS — M531 Cervicobrachial syndrome: Secondary | ICD-10-CM | POA: Diagnosis not present

## 2016-01-25 DIAGNOSIS — M9905 Segmental and somatic dysfunction of pelvic region: Secondary | ICD-10-CM | POA: Diagnosis not present

## 2016-01-25 DIAGNOSIS — M5136 Other intervertebral disc degeneration, lumbar region: Secondary | ICD-10-CM | POA: Diagnosis not present

## 2016-01-25 DIAGNOSIS — M9901 Segmental and somatic dysfunction of cervical region: Secondary | ICD-10-CM | POA: Diagnosis not present

## 2016-01-25 DIAGNOSIS — M461 Sacroiliitis, not elsewhere classified: Secondary | ICD-10-CM | POA: Diagnosis not present

## 2016-01-25 NOTE — Telephone Encounter (Signed)
Called to initiate the PA for the Utibron and had to leave a message for them to call back.

## 2016-01-28 DIAGNOSIS — M9901 Segmental and somatic dysfunction of cervical region: Secondary | ICD-10-CM | POA: Diagnosis not present

## 2016-01-28 DIAGNOSIS — M531 Cervicobrachial syndrome: Secondary | ICD-10-CM | POA: Diagnosis not present

## 2016-01-28 DIAGNOSIS — M9905 Segmental and somatic dysfunction of pelvic region: Secondary | ICD-10-CM | POA: Diagnosis not present

## 2016-01-28 DIAGNOSIS — G5603 Carpal tunnel syndrome, bilateral upper limbs: Secondary | ICD-10-CM | POA: Diagnosis not present

## 2016-01-28 DIAGNOSIS — M9903 Segmental and somatic dysfunction of lumbar region: Secondary | ICD-10-CM | POA: Diagnosis not present

## 2016-01-28 DIAGNOSIS — M461 Sacroiliitis, not elsewhere classified: Secondary | ICD-10-CM | POA: Diagnosis not present

## 2016-01-28 DIAGNOSIS — M5136 Other intervertebral disc degeneration, lumbar region: Secondary | ICD-10-CM | POA: Diagnosis not present

## 2016-01-28 NOTE — Telephone Encounter (Signed)
Will forward this message back to triage as a PA needs to be initiated for this medication

## 2016-01-28 NOTE — Telephone Encounter (Signed)
[phone] EOC# [id number]  time senstive matter

## 2016-01-28 NOTE — Telephone Encounter (Signed)
Attempted to call to initiate the PA---all coordinators were busy at this time---will need to call back.

## 2016-01-29 NOTE — Telephone Encounter (Signed)
lmomtcb x 2  

## 2016-01-31 DIAGNOSIS — M531 Cervicobrachial syndrome: Secondary | ICD-10-CM | POA: Diagnosis not present

## 2016-01-31 DIAGNOSIS — M461 Sacroiliitis, not elsewhere classified: Secondary | ICD-10-CM | POA: Diagnosis not present

## 2016-01-31 DIAGNOSIS — M9905 Segmental and somatic dysfunction of pelvic region: Secondary | ICD-10-CM | POA: Diagnosis not present

## 2016-01-31 DIAGNOSIS — M5136 Other intervertebral disc degeneration, lumbar region: Secondary | ICD-10-CM | POA: Diagnosis not present

## 2016-01-31 DIAGNOSIS — M9903 Segmental and somatic dysfunction of lumbar region: Secondary | ICD-10-CM | POA: Diagnosis not present

## 2016-01-31 DIAGNOSIS — M9901 Segmental and somatic dysfunction of cervical region: Secondary | ICD-10-CM | POA: Diagnosis not present

## 2016-01-31 NOTE — Telephone Encounter (Signed)
lmtcb x3 for [employer]

## 2016-01-31 NOTE — Telephone Encounter (Signed)
Form has been received from [facility] and placed in BQ look at to be signed.  Will forward to [place] and BQ to make them aware. thanks

## 2016-02-01 DIAGNOSIS — G5603 Carpal tunnel syndrome, bilateral upper limbs: Secondary | ICD-10-CM | POA: Diagnosis not present

## 2016-02-07 NOTE — Telephone Encounter (Signed)
Will forward to [place] so she may fax these forms to [facility].

## 2016-02-07 NOTE — Telephone Encounter (Signed)
signed

## 2016-02-12 ENCOUNTER — Telehealth: Payer: Self-pay | Admitting: Pulmonary Disease

## 2016-02-12 NOTE — Telephone Encounter (Signed)
Started PA paperwork on 02/12/2016 for Utibron. Faxed a copy of OV notes to [employer]. Waiting on response.

## 2016-02-13 NOTE — Telephone Encounter (Signed)
PA has been signed and faxed

## 2016-02-15 ENCOUNTER — Encounter: Payer: Self-pay | Admitting: Family Medicine

## 2016-02-15 ENCOUNTER — Ambulatory Visit (INDEPENDENT_AMBULATORY_CARE_PROVIDER_SITE_OTHER): Payer: PPO | Admitting: Family Medicine

## 2016-02-15 VITALS — BP 120/70 | HR 80 | Ht 71.0 in | Wt 146.0 lb

## 2016-02-15 DIAGNOSIS — J0100 Acute maxillary sinusitis, unspecified: Secondary | ICD-10-CM

## 2016-02-15 DIAGNOSIS — J40 Bronchitis, not specified as acute or chronic: Secondary | ICD-10-CM

## 2016-02-15 DIAGNOSIS — J441 Chronic obstructive pulmonary disease with (acute) exacerbation: Secondary | ICD-10-CM | POA: Diagnosis not present

## 2016-02-15 MED ORDER — IPRATROPIUM-ALBUTEROL 0.5-2.5 (3) MG/3ML IN SOLN: 3.0000 mL | Freq: Four times a day (QID) | RESPIRATORY_TRACT | 1 refills | Status: DC | PRN

## 2016-02-15 MED ORDER — LEVOFLOXACIN 500 MG PO TABS: 500.0000 mg | ORAL_TABLET | Freq: Every day | ORAL | 0 refills | Status: DC

## 2016-02-15 MED ORDER — GUAIFENESIN-CODEINE 100-10 MG/5ML PO SYRP: 5.0000 mL | ORAL_SOLUTION | Freq: Three times a day (TID) | ORAL | 0 refills | Status: DC | PRN

## 2016-02-15 NOTE — Addendum Note (Signed)
Addended by: Fredderick Severance on: 02/15/2016 02:10 PM   Modules accepted: Orders

## 2016-02-15 NOTE — Progress Notes (Signed)
Name: Antonio [name]   MRN: [id number]    DOB: 1945/11/27   Date:02/15/2016       Progress Note  Subjective  Chief Complaint  Chief Complaint  Patient presents with  . Cough    cong and yellow production    Cough  This is a new problem. The current episode started in the past 7 days. The problem has been gradually worsening. The problem occurs every few minutes. The cough is productive of purulent sputum (- green). Associated symptoms include a fever, headaches, myalgias, nasal congestion, postnasal drip, rhinorrhea, shortness of breath and wheezing. Pertinent negatives include no chest pain, chills, ear congestion, ear pain, heartburn, hemoptysis, rash, sore throat, sweats or weight loss. Nothing aggravates the symptoms. He has tried a beta-agonist inhaler for the symptoms. The treatment provided mild relief. There is no history of environmental allergies.    No problem-specific Assessment & Plan notes found for this encounter.   Past Medical History:  Diagnosis Date  . Allergy   . Calf pain   . COPD (chronic obstructive pulmonary disease) ([place])   . COPD (chronic obstructive pulmonary disease) ([place])   . Hypokalemia   . Internal hemorrhoids   . Muscle cramp   . Muscle pain   . Tubular adenoma of colon   . Ulcerative colitis [facility])     Past Surgical History:  Procedure Laterality Date  . [place]  . leg stent Left approx 45 yrs ago  . MENISCUS REPAIR Left 12/2013  . ROTATOR CUFF REPAIR Left 2007  . TONSILLECTOMY AND ADENOIDECTOMY  1957    Family History  Problem Relation Age of Onset  . Cancer Mother     leukemia  . Heart disease Father   . Colon cancer Neg Hx     Social History   Social History  . Marital status: Married    Spouse name: N/A  . Number of children: 2  . Years of education: N/A   Occupational History  . retired    Social History Main Topics  . Smoking status: Former Smoker    Packs/day: 0.75    Years: 35.00    Types:  Cigarettes, Cigars    Quit date: 06/11/2011  . Smokeless tobacco: Never Used     Comment: off and on smoker   . Alcohol use 4.2 oz/week    7 Standard drinks or equivalent per week     Comment: combination of beer, wine, liquor  . Drug use: No  . Sexual activity: Yes   Other Topics Concern  . Not on file   Social History Narrative  . No narrative on file    Allergies  Allergen Reactions  . Penicillins Rash     Review of Systems  Constitutional: Positive for fever. Negative for chills, diaphoresis, malaise/fatigue and weight loss.  HENT: Positive for postnasal drip and rhinorrhea. Negative for ear discharge, ear pain and sore throat.   Eyes: Negative for blurred vision.  Respiratory: Positive for cough, shortness of breath and wheezing. Negative for hemoptysis and sputum production.   Cardiovascular: Negative for chest pain, palpitations and leg swelling.  Gastrointestinal: Negative for abdominal pain, blood in stool, constipation, diarrhea, heartburn, melena and nausea.  Genitourinary: Negative for dysuria, frequency, hematuria and urgency.  Musculoskeletal: Positive for myalgias. Negative for back pain, joint pain and neck pain.  Skin: Negative for rash.  Neurological: Positive for headaches. Negative for dizziness, tingling, sensory change, focal weakness and weakness.  Endo/Heme/Allergies: Negative for environmental allergies  and polydipsia. Does not bruise/bleed easily.  Psychiatric/Behavioral: Negative for depression and suicidal ideas. The patient is not nervous/anxious and does not have insomnia.      Objective  Vitals:   02/15/16 1016  BP: 120/70  Pulse: 80  Weight: 146 lb (66.2 kg)  Height: 5' 11"  (1.803 m)    Physical Exam  Constitutional: He is oriented to person, place, and time and well-developed, well-nourished, and in no distress.  HENT:  Head: Normocephalic.  Right Ear: External ear normal.  Left Ear: External ear normal.  Nose: Nose normal.   Mouth/Throat: Oropharynx is clear and moist.  Eyes: Conjunctivae and EOM are normal. Pupils are equal, round, and reactive to light. Right eye exhibits no discharge. Left eye exhibits no discharge. No scleral icterus.  Neck: Normal range of motion. Neck supple. No JVD present. No tracheal deviation present. No thyromegaly present.  Cardiovascular: Normal rate, regular rhythm, normal heart sounds and intact distal pulses.  Exam reveals no gallop and no friction rub.   No murmur heard. Pulmonary/Chest: No respiratory distress. He has no wheezes. He has rales in the right lower field and the left lower field.  Abdominal: Soft. Bowel sounds are normal. He exhibits no mass. There is no hepatosplenomegaly. There is no tenderness. There is no rebound, no guarding and no CVA tenderness.  Musculoskeletal: Normal range of motion. He exhibits no edema or tenderness.  Lymphadenopathy:    He has no cervical adenopathy.  Neurological: He is alert and oriented to person, place, and time. He has normal sensation, normal strength, normal reflexes and intact cranial nerves. No cranial nerve deficit.  Skin: Skin is warm. No rash noted.  Psychiatric: Mood and affect normal.  Nursing note and vitals reviewed.     Assessment & Plan  Problem List Items Addressed This Visit    None    Visit Diagnoses    Bronchitis    -  Primary   Relevant Medications   levofloxacin (LEVAQUIN) 500 MG tablet   guaiFENesin-codeine (ROBITUSSIN AC) 100-10 MG/5ML syrup   Acute maxillary sinusitis, recurrence not specified       Relevant Medications   levofloxacin (LEVAQUIN) 500 MG tablet   guaiFENesin-codeine (ROBITUSSIN AC) 100-10 MG/5ML syrup        Dr. [facility]  02/15/16

## 2016-02-18 NOTE — Telephone Encounter (Signed)
Called [employer] back to check on status of PA. It was denied again due to the manufacturer not being on the list of approved manufacturers for the insurance plan. Waiting on the fax that will list the medications that will be covered.

## 2016-02-21 NOTE — Telephone Encounter (Signed)
Fax received from [employer] stating that [place] doe snot have a valid national Drug Code listed on the FDA website, so no Medicare plan will offer coverage for this medication. Formulary alternatives listed are Advair HFA, Breo, Combivent, Advair Diskus, Stiolto, Symbicort, and generic Duoneb.  BQ please advise on alternative.  Thanks.

## 2016-02-22 DIAGNOSIS — M461 Sacroiliitis, not elsewhere classified: Secondary | ICD-10-CM | POA: Diagnosis not present

## 2016-02-22 DIAGNOSIS — M9903 Segmental and somatic dysfunction of lumbar region: Secondary | ICD-10-CM | POA: Diagnosis not present

## 2016-02-22 DIAGNOSIS — M5136 Other intervertebral disc degeneration, lumbar region: Secondary | ICD-10-CM | POA: Diagnosis not present

## 2016-02-22 DIAGNOSIS — M9901 Segmental and somatic dysfunction of cervical region: Secondary | ICD-10-CM | POA: Diagnosis not present

## 2016-02-22 DIAGNOSIS — M9905 Segmental and somatic dysfunction of pelvic region: Secondary | ICD-10-CM | POA: Diagnosis not present

## 2016-02-22 DIAGNOSIS — M531 Cervicobrachial syndrome: Secondary | ICD-10-CM | POA: Diagnosis not present

## 2016-02-24 NOTE — Telephone Encounter (Signed)
He is really intent on taking Utibron.  I would call him and explain this to him before offering an alternative.  Of those I would choose Stiolto

## 2016-02-25 NOTE — Telephone Encounter (Signed)
atc pt X3, line rang to fast busy signal. Will call back- hold message in triage as it was generated by [place]

## 2016-02-29 ENCOUNTER — Ambulatory Visit: Payer: Self-pay | Admitting: Neurology

## 2016-03-06 ENCOUNTER — Ambulatory Visit (INDEPENDENT_AMBULATORY_CARE_PROVIDER_SITE_OTHER): Payer: PPO | Admitting: Neurology

## 2016-03-06 ENCOUNTER — Encounter: Payer: Self-pay | Admitting: Neurology

## 2016-03-06 VITALS — BP 120/40 | HR 73 | Ht 71.0 in | Wt 143.2 lb

## 2016-03-06 DIAGNOSIS — M4802 Spinal stenosis, cervical region: Secondary | ICD-10-CM | POA: Diagnosis not present

## 2016-03-06 DIAGNOSIS — R202 Paresthesia of skin: Secondary | ICD-10-CM | POA: Diagnosis not present

## 2016-03-06 NOTE — Patient Instructions (Addendum)
NCS/EMG of the arms on Tuesday January 30th at 9:30am.  Please arrive 15 minutes prior to appointment.   Further recommendations will be based on the results of your nerve testing
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
## 2016-03-10 ENCOUNTER — Encounter: Payer: Self-pay | Admitting: Family Medicine

## 2016-03-10 ENCOUNTER — Ambulatory Visit (INDEPENDENT_AMBULATORY_CARE_PROVIDER_SITE_OTHER): Payer: PPO | Admitting: Family Medicine

## 2016-03-10 VITALS — BP 120/62 | HR 80 | Ht 71.0 in | Wt 144.0 lb

## 2016-03-10 DIAGNOSIS — R058 Other specified cough: Secondary | ICD-10-CM

## 2016-03-10 DIAGNOSIS — R05 Cough: Secondary | ICD-10-CM | POA: Diagnosis not present

## 2016-03-10 MED ORDER — GUAIFENESIN-CODEINE 100-10 MG/5ML PO SYRP: 5.0000 mL | ORAL_SOLUTION | Freq: Three times a day (TID) | ORAL | 0 refills | Status: DC | PRN

## 2016-03-10 NOTE — Telephone Encounter (Signed)
ATC, line busy wcb 03/11/16

## 2016-03-10 NOTE — Telephone Encounter (Signed)
[phone] pt calling back

## 2016-03-10 NOTE — Progress Notes (Signed)
Name: Antonio [name]   MRN: [id number]    DOB: Aug 29, 1945   Date:03/10/2016       Progress Note  Subjective  Chief Complaint  Chief Complaint  Patient presents with  . Sinusitis    finished Levaquin on 02/25/16- is currently using a different inhaler by Dr [place] Bells at [facility]    Sinusitis  This is a recurrent problem. The current episode started in the past 7 days. The problem has been waxing and waning since onset. There has been no fever. The pain is mild. Associated symptoms include congestion. Pertinent negatives include no chills, coughing, diaphoresis, ear pain, headaches, hoarse voice, neck pain, shortness of breath, sinus pressure, sneezing, sore throat or swollen glands. (Postnasal drainage) Past treatments include nothing. The treatment provided mild relief.    No problem-specific Assessment & Plan notes found for this encounter.   Past Medical History:  Diagnosis Date  . Allergy   . Calf pain   . COPD (chronic obstructive pulmonary disease) ([place])   . COPD (chronic obstructive pulmonary disease) ([place])   . Hypokalemia   . Internal hemorrhoids   . Muscle cramp   . Muscle pain   . Tubular adenoma of colon   . Ulcerative colitis [facility])     Past Surgical History:  Procedure Laterality Date  . [place]  . leg stent Left approx 45 yrs ago  . MENISCUS REPAIR Left 12/2013  . ROTATOR CUFF REPAIR Left 2007  . TONSILLECTOMY AND ADENOIDECTOMY  1957    Family History  Problem Relation Age of Onset  . Cancer Mother     leukemia  . Heart disease Father   . Heart attack Father   . Healthy Daughter   . Healthy Son   . Colon cancer Neg Hx     Social History   Social History  . Marital status: Married    Spouse name: N/A  . Number of children: 2  . Years of education: N/A   Occupational History  . retired    Social History Main Topics  . Smoking status: Former Smoker    Packs/day: 0.75    Years: 35.00    Types: Cigarettes, Cigars   Quit date: 06/11/2011  . Smokeless tobacco: Never Used     Comment: off and on smoker   . Alcohol use 4.2 oz/week    7 Standard drinks or equivalent per week     Comment: combination of beer, wine, liquor  . Drug use: No  . Sexual activity: Yes   Other Topics Concern  . Not on file   Social History Narrative   Lives with wife in a 2 story home.  Has 2 children.     Retired from [employer] development [employer].     Education: college.     Allergies  Allergen Reactions  . Penicillins Rash     Review of Systems  Constitutional: Negative for chills, diaphoresis, fever, malaise/fatigue and weight loss.  HENT: Positive for congestion. Negative for ear discharge, ear pain, hoarse voice, sinus pressure, sneezing and sore throat.   Eyes: Negative for blurred vision.  Respiratory: Negative for cough, sputum production, shortness of breath and wheezing.   Cardiovascular: Negative for chest pain, palpitations and leg swelling.  Gastrointestinal: Negative for abdominal pain, blood in stool, constipation, diarrhea, heartburn, melena and nausea.  Genitourinary: Negative for dysuria, frequency, hematuria and urgency.  Musculoskeletal: Negative for back pain, joint pain, myalgias and neck pain.  Skin: Negative for rash.  Neurological:  Negative for dizziness, tingling, sensory change, focal weakness and headaches.  Endo/Heme/Allergies: Negative for environmental allergies and polydipsia. Does not bruise/bleed easily.  Psychiatric/Behavioral: Negative for depression and suicidal ideas. The patient is not nervous/anxious and does not have insomnia.      Objective  Vitals:   03/10/16 1455  BP: 120/62  Pulse: 80  Weight: 144 lb (65.3 kg)  Height: 5' 11"  (1.803 m)    Physical Exam  Constitutional: He is oriented to person, place, and time and well-developed, well-nourished, and in no distress.  HENT:  Head: Normocephalic.  Right Ear: External ear normal.  Left Ear: External ear normal.  Nose: Nose  normal.  Mouth/Throat: Oropharynx is clear and moist.  Eyes: Conjunctivae and EOM are normal. Pupils are equal, round, and reactive to light. Right eye exhibits no discharge. Left eye exhibits no discharge. No scleral icterus.  Neck: Normal range of motion. Neck supple. No JVD present. No tracheal deviation present. No thyromegaly present.  Cardiovascular: Normal rate, regular rhythm, normal heart sounds and intact distal pulses.  Exam reveals no gallop and no friction rub.   No murmur heard. Pulmonary/Chest: Breath sounds normal. No respiratory distress. He has no wheezes. He has no rales.  Abdominal: Soft. Bowel sounds are normal. He exhibits no mass. There is no hepatosplenomegaly. There is no tenderness. There is no rebound, no guarding and no CVA tenderness.  Musculoskeletal: Normal range of motion. He exhibits no edema or tenderness.  Lymphadenopathy:    He has no cervical adenopathy.  Neurological: He is alert and oriented to person, place, and time. He has normal sensation, normal strength, normal reflexes and intact cranial nerves. No cranial nerve deficit.  Skin: Skin is warm. No rash noted.  Psychiatric: Mood and affect normal.  Nursing note and vitals reviewed.     Assessment & Plan  Problem List Items Addressed This Visit    None    Visit Diagnoses    Nocturnal cough    -  Primary   pt to use inhaler bid   Relevant Medications   guaiFENesin-codeine (ROBITUSSIN AC) 100-10 MG/5ML syrup        Dr. [facility]  03/10/16

## 2016-03-11 ENCOUNTER — Ambulatory Visit (INDEPENDENT_AMBULATORY_CARE_PROVIDER_SITE_OTHER): Payer: PPO | Admitting: Neurology

## 2016-03-11 DIAGNOSIS — M4802 Spinal stenosis, cervical region: Secondary | ICD-10-CM

## 2016-03-11 DIAGNOSIS — M5412 Radiculopathy, cervical region: Secondary | ICD-10-CM

## 2016-03-11 DIAGNOSIS — R202 Paresthesia of skin: Secondary | ICD-10-CM

## 2016-03-11 NOTE — Procedures (Signed)
[facility]  [address], [address]  [place], [place] [address] Tel: [phone] Fax:  [phone] Test Date:  03/11/2016  Patient: Antonio [name] DOB: July 22, 1945 Physician: Narda Amber, DO  Sex: Male Height: 5' 11"  Ref Phys: Narda Amber, DO  ID#: [id number] Temp: 37.2C Technician: Jerilynn Mages. Dean    Patient Complaints: This is a 71 year old gentleman referred for evaluation of bilateral hand numbness, worse on the left.   NCV & EMG Findings: Extensive electrodiagnostic testing of the left upper extremity and additional studies of the right shows:  1. Bilateral median, ulnar, radial, and mixed palmar sensory responses are within normal limits. 2. Left median motor response shows markedly reduced amplitude (3.5 mV) and normal latency.  Bilateral ulnar and the right median motor responses within normal limits. 3. Chronic motor axon loss changes are seen affecting bilateral C8 myotomes and the left abductor pollicis brevis muscle where there is also moderate atrophy. There is no evidence of accompanied active denervation.  Impression: 1. 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 2. 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 3. There is no evidence of a generalized sensorimotor polyneuropathy or carpal tunnel syndrome affecting the upper extremities.   ___________________________ Narda Amber, DO    Nerve Conduction Studies Anti Sensory Summary Table   Site NR Peak (ms) Norm Peak (ms) P-T Amp (V) Norm P-T Amp  Left Median Anti Sensory (2nd Digit)  37.2C  Wrist    3.7 <3.8 21.0 >10  Right Median Anti Sensory (2nd Digit)  37.2C  Wrist    3.4 <3.8 15.7 >10  Left Radial Anti Sensory (Base 1st Digit)  37.2C  Wrist    2.1 <2.8 18.8 >10  Right Radial Anti Sensory (Base 1st Digit)  37.2C  Wrist    2.1 <2.8 14.8 >10  Left Ulnar Anti Sensory (5th Digit)  37.2C    Wrist    3.0 <3.2 7.5 >5  Right Ulnar Anti Sensory (5th Digit)  37.2C  Wrist    3.2 <3.2 7.6 >5   Motor Summary Table   Site NR Onset (ms) Norm Onset (ms) O-P Amp (mV) Norm O-P Amp Site1 Site2 Delta-0 (ms) Dist (cm) Vel (m/s) Norm Vel (m/s)  Left Median Motor (Abd Poll Brev)  37.2C  Wrist    3.5 <4.0 3.5 >5 Elbow Wrist 5.4 27.0 50 >50  Elbow    8.9  3.1         Right Median Motor (Abd Poll Brev)  37.2C  Wrist    2.7 <4.0 9.2 >5 Elbow Wrist 5.3 28.0 53 >50  Elbow    8.0  8.3         Left Ulnar Motor (Abd Dig Minimi)  37.2C  Wrist    2.7 <3.1 10.2 >7 B Elbow Wrist 4.2 25.0 60 >50  B Elbow    6.9  8.7  A Elbow B Elbow 1.9 10.0 53 >50  A Elbow    8.8  8.3         Right Ulnar Motor (Abd Dig Minimi)  37.2C  Wrist    2.7 <3.1 12.0 >7 B Elbow Wrist 4.3 25.0 58 >50  B Elbow    7.0  10.2  A Elbow B Elbow 1.8 10.0 56 >50  A Elbow    8.8  10.0          Comparison Summary Table   Site NR Peak (ms) Norm Peak (ms)  P-T Amp (V) Site1 Site2 Delta-P (ms) Norm Delta (ms)  Left Median/Ulnar Palm Comparison (Wrist - 8cm)  37.2C  Median Palm    1.7 <2.2 21.6 Median Palm Ulnar Palm 0.1   Ulnar Palm    1.8 <2.2 10.2      Right Median/Ulnar Palm Comparison (Wrist - 8cm)  37.2C  Median Palm    1.8 <2.2 32.5 Median Palm Ulnar Palm 0.0   Ulnar Palm    1.8 <2.2 6.4       EMG   Side Muscle Ins Act Fibs Psw Fasc Number Recrt Dur Dur. Amp Amp. Poly Poly. Comment  Left 1stDorInt Nml Nml Nml Nml 1- Rapid Some 1+ Some 1+ Nml Nml N/A  Left Biceps Nml Nml Nml Nml Nml Nml Nml Nml Nml Nml Nml Nml N/A  Left Abd Poll Brev Nml Nml Nml Nml SMU Rapid All 1+ All 1+ All 1+ ATR  Left Ext Indicis Nml Nml Nml Nml 1- Rapid Some 1+ Some 1+ Nml Nml N/A  Left PronatorTeres Nml Nml Nml Nml Nml Nml Nml Nml Nml Nml Nml Nml N/A  Left Deltoid Nml Nml Nml Nml Nml Nml Nml Nml Nml Nml Nml Nml N/A  Left Triceps Nml Nml Nml Nml 1- Rapid Some 1+ Some 1+ Nml Nml N/A  Left FlexPolLong Nml Nml Nml Nml 1- Rapid Some 1+ Some 1+ Nml  Nml N/A  Left Cervical Parasp Low Nml Nml Nml Nml NE - - - - - - - N/A  Right 1stDorInt Nml Nml Nml Nml 1- Rapid Some 1+ Some 1+ Nml Nml N/A  Right Ext Indicis Nml Nml Nml Nml 1- Rapid Some 1+ Some 1+ Nml Nml N/A  Right Abd Poll Brev Nml Nml Nml Nml Nml Nml Nml Nml Nml Nml Nml Nml N/A  Right Biceps Nml Nml Nml Nml Nml Nml Nml Nml Nml Nml Nml Nml N/A  Right Triceps Nml Nml Nml Nml 1- Rapid Some 1+ Some 1+ Nml Nml N/A  Right Deltoid Nml Nml Nml Nml Nml Nml Nml Nml Nml Nml Nml Nml N/A  Right PronatorTeres Nml Nml Nml Nml Nml Nml Nml Nml Nml Nml Nml Nml N/A      Waveforms:

## 2016-03-13 ENCOUNTER — Other Ambulatory Visit: Payer: Self-pay | Admitting: *Deleted

## 2016-03-13 ENCOUNTER — Telehealth: Payer: Self-pay | Admitting: *Deleted

## 2016-03-13 DIAGNOSIS — M5410 Radiculopathy, site unspecified: Secondary | ICD-10-CM

## 2016-03-13 NOTE — Telephone Encounter (Signed)
-----   Message from Antonio Berthold, DO sent at 03/11/2016  5:17 PM EST ----- Please inform patient that his nerve testing is most suggestive of a cervical radiculopathy with nerve impingement in the neck which does not show any evidence of neuropathy or carpal tunnel syndrome. The next step is MRI cervical spine without contrast, please order if patient agreeable. Thanks.

## 2016-03-13 NOTE — Telephone Encounter (Signed)
Patient given results and order for MRI sent to [facility].

## 2016-03-17 ENCOUNTER — Telehealth: Payer: Self-pay | Admitting: Neurology

## 2016-03-17 NOTE — Telephone Encounter (Signed)
Patient wants to know the status of the mri being sch please call [phone]

## 2016-03-18 NOTE — Telephone Encounter (Signed)
03-23-16. 

## 2016-03-23 ENCOUNTER — Ambulatory Visit
Admission: RE | Admit: 2016-03-23 | Discharge: 2016-03-23 | Disposition: A | Discharge status: Home / Self Care | Payer: PPO | Source: Ambulatory Visit | Attending: Neurology | Admitting: Neurology

## 2016-03-23 DIAGNOSIS — M4802 Spinal stenosis, cervical region: Secondary | ICD-10-CM | POA: Diagnosis not present

## 2016-03-23 DIAGNOSIS — M5410 Radiculopathy, site unspecified: Secondary | ICD-10-CM

## 2016-03-25 ENCOUNTER — Telehealth: Payer: Self-pay | Admitting: *Deleted

## 2016-03-25 NOTE — Telephone Encounter (Signed)
Referral faxed

## 2016-03-25 NOTE — Telephone Encounter (Signed)
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  Recommend evaluation by neurosurgery - please send referral to Dr. Vertell Limber.  Thanks.

## 2016-04-03 ENCOUNTER — Other Ambulatory Visit: Payer: Self-pay | Admitting: Neurosurgery

## 2016-04-03 DIAGNOSIS — M4802 Spinal stenosis, cervical region: Secondary | ICD-10-CM | POA: Diagnosis not present

## 2016-04-10 ENCOUNTER — Other Ambulatory Visit: Payer: Self-pay | Admitting: Family Medicine

## 2016-04-10 DIAGNOSIS — J441 Chronic obstructive pulmonary disease with (acute) exacerbation: Secondary | ICD-10-CM

## 2016-04-10 HISTORY — PX: CERVICAL DISCECTOMY: SHX98

## 2016-04-14 ENCOUNTER — Encounter: Payer: Self-pay | Admitting: Pulmonary Disease

## 2016-04-14 ENCOUNTER — Ambulatory Visit (INDEPENDENT_AMBULATORY_CARE_PROVIDER_SITE_OTHER): Payer: PPO | Admitting: Pulmonary Disease

## 2016-04-14 DIAGNOSIS — J449 Chronic obstructive pulmonary disease, unspecified: Secondary | ICD-10-CM | POA: Diagnosis not present

## 2016-04-14 MED ORDER — TIOTROPIUM BROMIDE-OLODATEROL 2.5-2.5 MCG/ACT IN AERS
2.0000 | INHALATION_SPRAY | Freq: Every day | RESPIRATORY_TRACT | 3 refills | Status: DC
Start: 2016-04-14 — End: 2016-06-17

## 2016-04-14 MED ORDER — TIOTROPIUM BROMIDE-OLODATEROL 2.5-2.5 MCG/ACT IN AERS: 2.0000 | INHALATION_SPRAY | Freq: Every day | RESPIRATORY_TRACT | 0 refills | Status: DC

## 2016-04-14 NOTE — Patient Instructions (Signed)
Stop DIRECTV 2 puffs daily no matter how you feel We will see you back in 6 months or sooner if needed

## 2016-04-14 NOTE — Progress Notes (Signed)
Subjective:    Patient ID: Antonio [name], male    DOB: 1945-06-18, 71 y.o.   MRN: [id number]  Synopsis: He was referred for shortness of breath in 2016. He had a lengthy smoking history but quit smoking in 2014. April 2016 pulmonary function testing confirmed COPD which was severe with an FEV1 of 1.47 L (40% predicted). December 2017 lung function testing showed ratio 50%, FEV1 1.47 L 43% predicted, FVC 2.96 L 67% predicted  HPI Chief Complaint  Patient presents with  . Follow-up    pt having sx on 3/26, needs sx clearance.  also wants to discuss inhalers.     Audel has been doing OK.  He has surgery planned on 3/26 for his cervical spine disease by Dr. Trenton Gammon.  He has been having some numbness and tingling in his fingers and was eventually seen Dr. Narda Amber who performed nerve conduction studies who said that he didn't have peripheral neuropathy and he was diagnosed with cervical spine disease.  The plan is for him to have cervical spinal fusion at level C 3-4.  He hopes that this will help with his numbness in his fingertips.    Past Medical History:  Diagnosis Date  . Allergy   . Calf pain   . COPD (chronic obstructive pulmonary disease) ([place])   . COPD (chronic obstructive pulmonary disease) ([place])   . Hypokalemia   . Internal hemorrhoids   . Muscle cramp   . Muscle pain   . Tubular adenoma of colon   . Ulcerative colitis ([place])       Review of Systems  Constitutional: Negative for chills, fatigue and fever.  HENT: Negative for postnasal drip, rhinorrhea and sinus pain.   Respiratory: Positive for cough, shortness of breath and wheezing.   Cardiovascular: Negative for chest pain, palpitations and leg swelling.       Objective:   Physical Exam  Vitals:   04/14/16 1506  BP: 134/66  Pulse: 68  SpO2: 99%  Weight: 145 lb (65.8 kg)  Height: 5' 11"  (1.803 m)   RA  Gen: well appearing HENT: OP clear, TM's clear, neck supple PULM: CTA B, normal  percussion CV: RRR, no mgr, trace edema GI: BS+, soft, nontender Derm: no cyanosis or rash Psyche: normal mood and affect        Assessment & Plan:   Centrilobular Emphysema He has severe COPD and and severe centrilobular emphysema but this has been a stable interval for him recently. His insurance no longer covers his long acting inhaled medicines.  However, his insurance covers [place] which is a great medicine for his condition.  Plan: Stop DIRECTV 2 puffs daily Okay from my standpoint to proceed with neurosurgery. I recommended that he take Stiolto on the day of surgery, get up and walk around when instructed We are available for consultation if necessary at the time of his neurosurgery. We will see him back as previously scheduled    Current Outpatient Prescriptions:  .  albuterol (PROVENTIL HFA;VENTOLIN HFA) 108 (90 BASE) MCG/ACT inhaler, Inhale 2 puffs into the lungs every 6 (six) hours as needed for wheezing or shortness of breath., Disp: 1 Inhaler, Rfl: 5 .  albuterol (PROVENTIL) (2.5 MG/3ML) 0.083% nebulizer solution, Take 3 mLs (2.5 mg total) by nebulization every 6 (six) hours as needed for wheezing or shortness of breath., Disp: 75 mL, Rfl: 12 .  Ascorbic Acid (VITAMIN C) 100 MG tablet, Take 100 mg by mouth daily., Disp: , Rfl:  .  aspirin 81 MG tablet, Take 81 mg by mouth daily., Disp: , Rfl:  .  EPINEPHrine 0.3 mg/0.3 mL IJ SOAJ injection, Inject 0.3 mLs (0.3 mg total) into the muscle once., Disp: 1 Device, Rfl: 1 .  guaiFENesin-codeine (ROBITUSSIN AC) 100-10 MG/5ML syrup, Take 5 mLs by mouth 3 (three) times daily as needed for cough., Disp: 150 mL, Rfl: 0 .  Indacaterol-Glycopyrrolate (UTIBRON NEOHALER) 27.5-15.6 MCG CAPS, Place 2 puffs into inhaler and inhale daily., Disp: 30 capsule, Rfl: 0 .  ipratropium-albuterol (DUONEB) 0.5-2.5 (3) MG/3ML SOLN, INHALE 1 VIAL VIA NEBULIZER EVERY 6 HOURS AS NEEDED FOR SHORTNESS OF BREATH, Disp: 360 mL, Rfl: 0 .   loperamide (IMODIUM) 2 MG capsule, Take by mouth as needed for diarrhea or loose stools., Disp: , Rfl:  .  mesalamine (LIALDA) 1.2 g EC tablet, Take 3 tablets (3.6 g total) by mouth daily with breakfast. (Patient taking differently: Take 2.4 g by mouth daily with breakfast. ), Disp: 48 tablet, Rfl: 0 .  Multiple Vitamin (MULTIVITAMIN WITH MINERALS) TABS tablet, Take 1 tablet by mouth daily., Disp: , Rfl:  .  POTASSIUM & SODIUM PHOSPHATES PO, Take 1 tablet by mouth daily., Disp: , Rfl:  .  Respiratory Therapy Supplies (FLUTTER) DEVI, Use as directed, Disp: 1 each, Rfl: 0 .  vitamin E 400 UNIT capsule, Take 400 Units by mouth daily., Disp: , Rfl:   Current Facility-Administered Medications:  .  albuterol (PROVENTIL) (2.5 MG/3ML) 0.083% nebulizer solution 2.5 mg, 2.5 mg, Nebulization, Once, [name], MD

## 2016-04-14 NOTE — Assessment & Plan Note (Signed)
He has severe COPD and and severe centrilobular emphysema but this has been a stable interval for him recently. His insurance no longer covers his long acting inhaled medicines.  However, his insurance covers [place] which is a great medicine for his condition.  Plan: Stop DIRECTV 2 puffs daily Okay from my standpoint to proceed with neurosurgery. I recommended that he take Stiolto on the day of surgery, get up and walk around when instructed We are available for consultation if necessary at the time of his neurosurgery. We will see him back as previously scheduled

## 2016-04-22 ENCOUNTER — Telehealth: Payer: Self-pay | Admitting: Internal Medicine

## 2016-04-23 ENCOUNTER — Other Ambulatory Visit: Payer: Self-pay

## 2016-04-23 MED ORDER — MESALAMINE 1.2 G PO TBEC: 3.6000 g | DELAYED_RELEASE_TABLET | Freq: Every day | ORAL | 1 refills | Status: DC

## 2016-04-23 NOTE — Telephone Encounter (Signed)
Patient is scheduled for office visit for routine follow up 06/03/16. I have sent script for Lialda to his home address at his request. He verbalizes understanding.

## 2016-04-23 NOTE — Addendum Note (Signed)
Addended by: Larina Bras on: 04/23/2016 12:41 PM   Modules accepted: Orders

## 2016-04-25 NOTE — Pre-Procedure Instructions (Addendum)
Antonio [name]  04/25/2016      [facility] [place], [address] AT [address] [place] [facility] [place] [address] Phone: [phone] Fax: [place] [address] [place] [address] Phone: [phone] Fax: [phone]    Your procedure is scheduled on March 26.  Report to [facility] Admitting at 600 A.M.  Call this number if you have problems the morning of surgery:  [phone]   Remember:  Do not eat food or drink liquids after midnight.  Take these medicines the morning of surgery with A SIP OF WATER  Albuterol Inhaler if needed- bring your inhalers with you on the day of surgery, Albuterol neb if needed, Utibron neohaler, Duoneb if needed  Stop taking aspirin, BC's, Goody's, Herbal medications, Fish Oil, Ibuprofen, Advil, Motrin, Aleve, Lialda    Do not wear jewelry, make-up or nail polish.  Do not wear lotions, powders, or perfumes, or deoderant.  Do not shave 48 hours prior to surgery.  Men may shave face and neck.  Do not bring valuables to the hospital.  [facility] is not responsible for any belongings or valuables.  Contacts, dentures or bridgework may not be worn into surgery.  Leave your suitcase in the car.  After surgery it may be brought to your room.  For patients admitted to the hospital, discharge time will be determined by your treatment team.  Patients discharged the day of surgery will not be allowed to drive home.    Special instructions:  [place] - Preparing for Surgery  Before surgery, you can play an important role.  Because skin is not sterile, your skin needs to be as free of germs as possible.  You can reduce the number of germs on you skin by washing with CHG (chlorahexidine gluconate) soap before surgery.  CHG is an antiseptic cleaner which kills germs and bonds with the skin to continue killing germs  even after washing.  Please DO NOT use if you have an allergy to CHG or antibacterial soaps.  If your skin becomes reddened/irritated stop using the CHG and inform your nurse when you arrive at Short Stay.  Do not shave (including legs and underarms) for at least 48 hours prior to the first CHG shower.  You may shave your face.  Please follow these instructions carefully:   1.  Shower with CHG Soap the night before surgery and the                                morning of Surgery.  2.  If you choose to wash your hair, wash your hair first as usual with your       normal shampoo.  3.  After you shampoo, rinse your hair and body thoroughly to remove the                      Shampoo.  4.  Use CHG as you would any other liquid soap.  You can apply chg directly       to the skin and wash gently with scrungie or a clean washcloth.  5.  Apply the CHG Soap to your body ONLY FROM THE NECK DOWN.        Do not use on open  wounds or open sores.  Avoid contact with your eyes,       ears, mouth and genitals (private parts).  Wash genitals (private parts)       with your normal soap.  6.  Wash thoroughly, paying special attention to the area where your surgery        will be performed.  7.  Thoroughly rinse your body with warm water from the neck down.  8.  DO NOT shower/wash with your normal soap after using and rinsing off       the CHG Soap.  9.  Pat yourself dry with a clean towel.            10.  Wear clean pajamas.            11.  Place clean sheets on your bed the night of your first shower and do not        sleep with pets.  Day of Surgery  Do not apply any lotions/deoderants the morning of surgery.  Please wear clean clothes to the hospital/surgery center.     Please read over the following fact sheets that you were given. Pain Booklet, Coughing and Deep Breathing, MRSA Information and Surgical Site Infection Prevention

## 2016-04-28 ENCOUNTER — Encounter (HOSPITAL_COMMUNITY)
Admission: RE | Admit: 2016-04-28 | Discharge: 2016-04-28 | Disposition: A | Discharge status: Home / Self Care | Payer: PPO | Source: Ambulatory Visit | Attending: Neurosurgery | Admitting: Neurosurgery

## 2016-04-28 ENCOUNTER — Other Ambulatory Visit: Payer: Self-pay

## 2016-04-28 ENCOUNTER — Encounter (HOSPITAL_COMMUNITY): Payer: Self-pay

## 2016-04-28 DIAGNOSIS — M5412 Radiculopathy, cervical region: Secondary | ICD-10-CM | POA: Insufficient documentation

## 2016-04-28 DIAGNOSIS — Z01818 Encounter for other preprocedural examination: Secondary | ICD-10-CM | POA: Insufficient documentation

## 2016-04-28 DIAGNOSIS — M4802 Spinal stenosis, cervical region: Secondary | ICD-10-CM | POA: Insufficient documentation

## 2016-04-28 HISTORY — DX: Other specified postprocedural states: Z98.890

## 2016-04-28 HISTORY — DX: Dyspnea, unspecified: R06.00

## 2016-04-28 HISTORY — DX: Pneumonia, unspecified organism: J18.9

## 2016-04-28 HISTORY — DX: Nausea with vomiting, unspecified: R11.2

## 2016-04-28 LAB — CBC
HCT: 44.2 % (ref 39.0–52.0)
Hemoglobin: 14.9 g/dL (ref 13.0–17.0)
MCH: 31 pg (ref 26.0–34.0)
MCHC: 33.7 g/dL (ref 30.0–36.0)
MCV: 92.1 fL (ref 78.0–100.0)
PLATELETS: 219 10*3/uL (ref 150–400)
RBC: 4.8 MIL/uL (ref 4.22–5.81)
RDW: 12.2 % (ref 11.5–15.5)
WBC: 8.7 10*3/uL (ref 4.0–10.5)

## 2016-04-28 LAB — COMPREHENSIVE METABOLIC PANEL
ALT: 33 U/L (ref 17–63)
AST: 37 U/L (ref 15–41)
Albumin: 4 g/dL (ref 3.5–5.0)
Alkaline Phosphatase: 70 U/L (ref 38–126)
Anion gap: 8 (ref 5–15)
BUN: 11 mg/dL (ref 6–20)
CO2: 26 mmol/L (ref 22–32)
CREATININE: 0.97 mg/dL (ref 0.61–1.24)
Calcium: 9.5 mg/dL (ref 8.9–10.3)
Chloride: 105 mmol/L (ref 101–111)
GFR calc Af Amer: >60 mL/min (ref >60)
Glucose, Bld: 94 mg/dL (ref 65–99)
Potassium: 4.3 mmol/L (ref 3.5–5.1)
Sodium: 139 mmol/L (ref 135–145)
Total Bilirubin: 0.5 mg/dL (ref 0.3–1.2)
Total Protein: 7.3 g/dL (ref 6.5–8.1)

## 2016-04-28 LAB — SURGICAL PCR SCREEN
MRSA, PCR: NEGATIVE
Staphylococcus aureus: NEGATIVE

## 2016-04-28 NOTE — Progress Notes (Signed)
PCP is Dr. Otilio Miu in [place]. Denies ever seeing a cardiologist. Denies ever having a card cath or echo. States he had a  Stress test many years ago. Pulmonary Dr is Dr. Derry Skill- clearance noted 04-14-16 States he had a routine appointment scheduled with Dr Lucky Cowboy in [facility] ([address]) on this Friday.  States his last dose of aspirin was 04-27-16 Denies any chest pain.

## 2016-05-02 ENCOUNTER — Other Ambulatory Visit (INDEPENDENT_AMBULATORY_CARE_PROVIDER_SITE_OTHER): Payer: Self-pay | Admitting: Vascular Surgery

## 2016-05-02 ENCOUNTER — Ambulatory Visit (INDEPENDENT_AMBULATORY_CARE_PROVIDER_SITE_OTHER): Payer: PPO

## 2016-05-02 ENCOUNTER — Ambulatory Visit (INDEPENDENT_AMBULATORY_CARE_PROVIDER_SITE_OTHER): Payer: PPO | Admitting: Vascular Surgery

## 2016-05-02 VITALS — BP 149/66 | HR 75 | Resp 16 | Ht 70.5 in | Wt 148.0 lb

## 2016-05-02 DIAGNOSIS — J449 Chronic obstructive pulmonary disease, unspecified: Secondary | ICD-10-CM

## 2016-05-02 DIAGNOSIS — I739 Peripheral vascular disease, unspecified: Secondary | ICD-10-CM

## 2016-05-02 DIAGNOSIS — I6529 Occlusion and stenosis of unspecified carotid artery: Secondary | ICD-10-CM | POA: Insufficient documentation

## 2016-05-02 DIAGNOSIS — I70212 Atherosclerosis of native arteries of extremities with intermittent claudication, left leg: Secondary | ICD-10-CM | POA: Diagnosis not present

## 2016-05-02 DIAGNOSIS — I6523 Occlusion and stenosis of bilateral carotid arteries: Secondary | ICD-10-CM

## 2016-05-02 DIAGNOSIS — I70219 Atherosclerosis of native arteries of extremities with intermittent claudication, unspecified extremity: Secondary | ICD-10-CM | POA: Insufficient documentation

## 2016-05-02 NOTE — Assessment & Plan Note (Signed)
Follows with a pulmonologist. Reasonably stable

## 2016-05-02 NOTE — Progress Notes (Signed)
MRN : [id number]  Antonio [name] is a 71 y.o. (Jan 20, 1946) male who presents with chief complaint of  Chief Complaint  Patient presents with  . Carotid    Ultrasound follow up  .  History of Present Illness: Patient returns today in follow up of multiple vascular issues.  He is doing well. He has stable reasonably mild claudication symptoms in the left leg currently. This has gotten significantly better after he stopped smoking a few years ago. He has a known SFA and popliteal occlusion but his ABIs are stable today at 0.48 on the left and 1.1 on the right. He remains asymptomatic from his carotid stenosis. He has been having numbness and tingling in his fingers and hands and is actually scheduled for neck surgery next week. Duplex shows stable 1-39% carotid artery stenosis bilaterally.  Current Outpatient Prescriptions  Medication Sig Dispense Refill  . albuterol (PROVENTIL HFA;VENTOLIN HFA) 108 (90 BASE) MCG/ACT inhaler Inhale 2 puffs into the lungs every 6 (six) hours as needed for wheezing or shortness of breath. 1 Inhaler 5  . albuterol (PROVENTIL) (2.5 MG/3ML) 0.083% nebulizer solution Take 3 mLs (2.5 mg total) by nebulization every 6 (six) hours as needed for wheezing or shortness of breath. 75 mL 12  . aspirin EC 81 MG tablet Take 81 mg by mouth daily.    Marland Kitchen EPINEPHrine 0.3 mg/0.3 mL IJ SOAJ injection Inject 0.3 mLs (0.3 mg total) into the muscle once. (Patient taking differently: Inject 0.3 mg into the muscle daily as needed (for anaphylatic reaction.). ) 1 Device 1  . guaiFENesin-codeine (ROBITUSSIN AC) 100-10 MG/5ML syrup Take 5 mLs by mouth 3 (three) times daily as needed for cough. 150 mL 0  . Indacaterol-Glycopyrrolate (UTIBRON NEOHALER) 27.5-15.6 MCG CAPS Place 2 puffs into inhaler and inhale daily. 30 capsule 0  . ipratropium-albuterol (DUONEB) 0.5-2.5 (3) MG/3ML SOLN INHALE 1 VIAL VIA NEBULIZER EVERY 6 HOURS AS NEEDED FOR SHORTNESS OF BREATH 360 mL 0  . loperamide  (IMODIUM) 2 MG capsule Take 2 mg by mouth daily as needed for diarrhea or loose stools.     Marland Kitchen MAGNESIUM PO Take 1 tablet by mouth daily.    . mesalamine (LIALDA) 1.2 g EC tablet Take 3 tablets (3.6 g total) by mouth daily with breakfast. 270 tablet 1  . Respiratory Therapy Supplies (FLUTTER) DEVI Use as directed 1 each 0  . Tiotropium Bromide-Olodaterol (STIOLTO RESPIMAT) 2.5-2.5 MCG/ACT AERS Inhale 2 puffs into the lungs daily. 2 Inhaler 0  . Tiotropium Bromide-Olodaterol (STIOLTO RESPIMAT) 2.5-2.5 MCG/ACT AERS Inhale 2 puffs into the lungs daily. 3 Inhaler 3  . vitamin C (ASCORBIC ACID) 500 MG tablet Take 500 mg by mouth daily.    . vitamin E 400 UNIT capsule Take 400 Units by mouth daily.     Current Facility-Administered Medications  Medication Dose Route Frequency Provider Last Rate Last Dose  . albuterol (PROVENTIL) (2.5 MG/3ML) 0.083% nebulizer solution 2.5 mg  2.5 mg Nebulization Once [name], MD        Past Medical History:  Diagnosis Date  . Allergy   . Calf pain   . COPD (chronic obstructive pulmonary disease) ([place])   . COPD (chronic obstructive pulmonary disease) ([place])   . Dyspnea   . Hypokalemia   . Internal hemorrhoids   . Muscle cramp   . Muscle pain   . Pneumonia   . PONV (postoperative nausea and vomiting)   . Tubular adenoma of colon   . Ulcerative colitis (  [facility])     Past Surgical History:  Procedure Laterality Date  . COLONOSCOPY    . [place]  . leg stent Left approx 5-6 yrs ago   2 to left leg  . MENISCUS REPAIR Left 12/2013  . ROTATOR CUFF REPAIR Left 2007  . TONSILLECTOMY AND ADENOIDECTOMY  1957    Social History Social History  Substance Use Topics  . Smoking status: Former Smoker    Packs/day: 0.75    Years: 35.00    Types: Cigarettes, Cigars    Quit date: 06/11/2011  . Smokeless tobacco: Never Used     Comment: off and on smoker   . Alcohol use 4.2 oz/week    7 Standard drinks or equivalent per week     Comment: 1-2  day     Family History Family History  Problem Relation Age of Onset  . Cancer Mother     leukemia  . Heart disease Father   . Heart attack Father   . Healthy Daughter   . Healthy Son   . Colon cancer Neg Hx     Allergies  Allergen Reactions  . Penicillins Rash    Has patient had a PCN reaction causing immediate rash, facial/tongue/throat swelling, SOB or lightheadedness with hypotension:Yes Has patient had a PCN reaction causing severe rash involving mucus membranes or skin necrosis:Yes Has patient had a PCN reaction that required hospitalization:No Has patient had a PCN reaction occurring within the last 10 years:No If all of the above answers are "NO", then may proceed with Cephalosporin use.      REVIEW OF SYSTEMS (Negative unless checked)  Constitutional: [] Weight loss  [] Fever  [] Chills Cardiac: [] Chest pain   [] Chest pressure   [] Palpitations   [] Shortness of breath when laying flat   [] Shortness of breath at rest   [] Shortness of breath with exertion. Vascular:  [x] Pain in legs with walking   [] Pain in legs at rest   [] Pain in legs when laying flat   [x] Claudication   [] Pain in feet when walking  [] Pain in feet at rest  [] Pain in feet when laying flat   [] History of DVT   [] Phlebitis   [] Swelling in legs   [] Varicose veins   [] Non-healing ulcers Pulmonary:   [] Uses home oxygen   [] Productive cough   [] Hemoptysis   [] Wheeze  [] COPD   [] Asthma Neurologic:  [] Dizziness  [] Blackouts   [] Seizures   [] History of stroke   [] History of TIA  [] Aphasia   [] Temporary blindness   [] Dysphagia   [x] Weakness or numbness in arms   [] Weakness or numbness in legs Musculoskeletal:  [] Arthritis   [] Joint swelling   [] Joint pain   [] Low back pain Hematologic:  [] Easy bruising  [] Easy bleeding   [] Hypercoagulable state   [] Anemic   Gastrointestinal:  [] Blood in stool   [] Vomiting blood  [] Gastroesophageal reflux/heartburn   [] Abdominal pain Genitourinary:  [] Chronic kidney disease    [] Difficult urination  [] Frequent urination  [] Burning with urination   [] Hematuria Skin:  [] Rashes   [] Ulcers   [] Wounds Psychological:  [] History of anxiety   []  History of major depression.  Physical Examination  BP (!) 149/66 (BP Location: Right Arm)   Pulse 75   Resp 16   Ht 5' 10.5" (1.791 m)   Wt 148 lb (67.1 kg)   BMI 20.94 kg/m  Gen:  WD/WN, NAD Head: [place]/AT, No temporalis wasting. Ear/Nose/Throat: Hearing grossly intact, nares w/o erythema or drainage, trachea midline Eyes: Conjunctiva clear. Sclera  non-icteric Neck: Supple.  No JVD.  Pulmonary:  Good air movement, no use of accessory muscles.  Cardiac: RRR, normal S1, S2 Vascular:  Vessel Right Left  Radial Palpable Palpable  Ulnar Palpable Palpable  Brachial Palpable Palpable  Carotid Palpable, without bruit Palpable, without bruit  Aorta Not palpable N/A  Femoral Palpable Palpable  Popliteal Palpable Not Palpable  PT Palpable 1+ Palpable  DP Palpable Not Palpable   Gastrointestinal: soft, non-tender/non-distended. No guarding/reflex.  Musculoskeletal: M/S 5/5 throughout.  No deformity or atrophy.  Neurologic: Sensation grossly intact in extremities.  Symmetrical.  Speech is fluent.  Psychiatric: Judgment intact, Mood & affect appropriate for pt's clinical situation. Dermatologic: No rashes or ulcers noted.  No cellulitis or open wounds. Lymph : No Cervical, Axillary, or Inguinal lymphadenopathy.      Labs Recent Results (from the past 2160 hour(s))  Surgical pcr screen     Status: None   Collection Time: 04/28/16  2:33 PM  Result Value Ref Range   MRSA, PCR NEGATIVE NEGATIVE   Staphylococcus aureus NEGATIVE NEGATIVE    Comment:        The Xpert SA Assay (FDA approved for NASAL specimens in patients over 46 years of age), is one component of a comprehensive surveillance program.  Test performance has been validated by [facility] for patients greater than or equal to 62 year old. It is not  intended to diagnose infection nor to guide or monitor treatment.   Comprehensive metabolic panel     Status: None   Collection Time: 04/28/16  2:33 PM  Result Value Ref Range   Sodium 139 135 - 145 mmol/L   Potassium 4.3 3.5 - 5.1 mmol/L   Chloride 105 101 - 111 mmol/L   CO2 26 22 - 32 mmol/L   Glucose, Bld 94 65 - 99 mg/dL   BUN 11 6 - 20 mg/dL   Creatinine, Ser 0.97 0.61 - 1.24 mg/dL   Calcium 9.5 8.9 - 10.3 mg/dL   Total Protein 7.3 6.5 - 8.1 g/dL   Albumin 4.0 3.5 - 5.0 g/dL   AST 37 15 - 41 U/L   ALT 33 17 - 63 U/L   Alkaline Phosphatase 70 38 - 126 U/L   Total Bilirubin 0.5 0.3 - 1.2 mg/dL   GFR calc non Af Amer >60 >60 mL/min   GFR calc Af Amer >60 >60 mL/min    Comment: (NOTE) The eGFR has been calculated using the CKD EPI equation. This calculation has not been validated in all clinical situations. eGFR's persistently <60 mL/min signify possible Chronic Kidney Disease.    Anion gap 8 5 - 15  CBC     Status: None   Collection Time: 04/28/16  2:33 PM  Result Value Ref Range   WBC 8.7 4.0 - 10.5 K/uL   RBC 4.80 4.22 - 5.81 MIL/uL   Hemoglobin 14.9 13.0 - 17.0 g/dL   HCT 44.2 39.0 - 52.0 %   MCV 92.1 78.0 - 100.0 fL   MCH 31.0 26.0 - 34.0 pg   MCHC 33.7 30.0 - 36.0 g/dL   RDW 12.2 11.5 - 15.5 %   Platelets 219 150 - 400 K/uL  VAS US CAROTID     Status: None (In process)   Collection Time: 05/02/16  1:35 PM  Result Value Ref Range   Right CCA prox sys 98 cm/s   Right CCA prox dias 13 cm/s   Right cca dist sys -68 cm/s   Left CCA prox  sys 116 cm/s   Left CCA prox dias 18 cm/s   Left CCA dist sys 96 cm/s   Left CCA dist dias 18 cm/s   Left ICA prox sys -82 cm/s   Left ICA prox dias -13 cm/s   Left ICA dist sys -70 cm/s   Left ICA dist dias -18 cm/s   RIGHT CCA MID DIAS 17.00 cm/s   RIGHT ECA DIAS 13.00 cm/s   LEFT ECA DIAS -14.00 cm/s    Radiology No results found.   Assessment/Plan  Centrilobular Emphysema Follows with a pulmonologist.  Reasonably stable  Carotid stenosis Asymptomatic. Duplex shows stable 1-39% carotid artery stenosis bilaterally. Continue aspirin therapy. Recheck in 1 year.  Atherosclerosis of native arteries of extremity with intermittent claudication (HCC) Not lifestyle limiting. ABIs are stable today at 0.48 on the left and 1.1 on the right. Continue aspirin. Recheck in 1 year.    Leotis Pain, MD  05/02/2016 3:11 PM    This note was created with Dragon medical transcription system.  Any errors from dictation are purely unintentional

## 2016-05-02 NOTE — Assessment & Plan Note (Signed)
Asymptomatic. Duplex shows stable 1-39% carotid artery stenosis bilaterally. Continue aspirin therapy. Recheck in 1 year.

## 2016-05-02 NOTE — Assessment & Plan Note (Signed)
Not lifestyle limiting. ABIs are stable today at 0.48 on the left and 1.1 on the right. Continue aspirin. Recheck in 1 year.

## 2016-05-04 MED ORDER — DEXAMETHASONE SODIUM PHOSPHATE 10 MG/ML IJ SOLN
10.0000 mg | INTRAMUSCULAR | Status: DC
  Filled 2016-05-04: qty 1

## 2016-05-04 MED ORDER — VANCOMYCIN HCL IN DEXTROSE 1-5 GM/200ML-% IV SOLN
1000.0000 mg | INTRAVENOUS | Status: AC
  Administered 2016-05-05: 1000 mg via INTRAVENOUS
  Filled 2016-05-04 (×2): qty 200

## 2016-05-04 NOTE — Anesthesia Preprocedure Evaluation (Addendum)
Anesthesia Evaluation  Patient identified by MRN, date of birth, ID band Patient awake    History of Anesthesia Complications (+) PONV  Airway Mallampati: I   Neck ROM: Full    Dental  (+) Teeth Intact, Dental Advisory Given   Pulmonary COPD,  COPD inhaler, former smoker,    breath sounds clear to auscultation       Cardiovascular + Peripheral Vascular Disease   Rhythm:Regular Rate:Normal     Neuro/Psych negative neurological ROS     GI/Hepatic Neg liver ROS, PUD,   Endo/Other  negative endocrine ROS  Renal/GU negative Renal ROS     Musculoskeletal   Abdominal   Peds  Hematology negative hematology ROS (+)   Anesthesia Other Findings   Reproductive/Obstetrics                           Lab Results  Component Value Date   WBC 8.7 04/28/2016   HGB 14.9 04/28/2016   HCT 44.2 04/28/2016   MCV 92.1 04/28/2016   PLT 219 04/28/2016   Lab Results  Component Value Date   CREATININE 0.97 04/28/2016   BUN 11 04/28/2016   NA 139 04/28/2016   K 4.3 04/28/2016   CL 105 04/28/2016   CO2 26 04/28/2016    Anesthesia Physical Anesthesia Plan  ASA: III  Anesthesia Plan: General   Post-op Pain Management:    Induction: Intravenous  Airway Management Planned: Oral ETT  Additional Equipment:   Intra-op Plan:   Post-operative Plan: Extubation in OR  Informed Consent: I have reviewed the patients History and Physical, chart, labs and discussed the procedure including the risks, benefits and alternatives for the proposed anesthesia with the patient or authorized representative who has indicated his/her understanding and acceptance.   Dental advisory given  Plan Discussed with:   Anesthesia Plan Comments:        Anesthesia Quick Evaluation

## 2016-05-05 ENCOUNTER — Inpatient Hospital Stay (HOSPITAL_COMMUNITY): Payer: PPO | Admitting: Certified Registered Nurse Anesthetist

## 2016-05-05 ENCOUNTER — Inpatient Hospital Stay (HOSPITAL_COMMUNITY): Payer: PPO

## 2016-05-05 ENCOUNTER — Encounter (HOSPITAL_COMMUNITY): Payer: Self-pay | Admitting: Certified Registered Nurse Anesthetist

## 2016-05-05 ENCOUNTER — Encounter (HOSPITAL_COMMUNITY)
Admission: RE | Disposition: A | Discharge status: Home / Self Care | Payer: Self-pay | Source: Ambulatory Visit | Attending: Neurosurgery

## 2016-05-05 ENCOUNTER — Inpatient Hospital Stay (HOSPITAL_COMMUNITY)
Admission: RE | Admit: 2016-05-05 | Discharge: 2016-05-06 | DRG: 472 | Disposition: A | Discharge status: Home / Self Care | Payer: PPO | Source: Ambulatory Visit | Attending: Neurosurgery | Admitting: Neurosurgery

## 2016-05-05 DIAGNOSIS — M5001 Cervical disc disorder with myelopathy,  high cervical region: Secondary | ICD-10-CM | POA: Diagnosis present

## 2016-05-05 DIAGNOSIS — Z87891 Personal history of nicotine dependence: Secondary | ICD-10-CM

## 2016-05-05 DIAGNOSIS — M542 Cervicalgia: Secondary | ICD-10-CM | POA: Diagnosis not present

## 2016-05-05 DIAGNOSIS — J449 Chronic obstructive pulmonary disease, unspecified: Secondary | ICD-10-CM | POA: Diagnosis not present

## 2016-05-05 DIAGNOSIS — M4802 Spinal stenosis, cervical region: Principal | ICD-10-CM | POA: Diagnosis present

## 2016-05-05 DIAGNOSIS — Z79899 Other long term (current) drug therapy: Secondary | ICD-10-CM | POA: Diagnosis not present

## 2016-05-05 DIAGNOSIS — J40 Bronchitis, not specified as acute or chronic: Secondary | ICD-10-CM

## 2016-05-05 DIAGNOSIS — Z880 Allergy status to penicillin: Secondary | ICD-10-CM

## 2016-05-05 DIAGNOSIS — Z7982 Long term (current) use of aspirin: Secondary | ICD-10-CM

## 2016-05-05 DIAGNOSIS — M4322 Fusion of spine, cervical region: Secondary | ICD-10-CM | POA: Diagnosis not present

## 2016-05-05 DIAGNOSIS — Z419 Encounter for procedure for purposes other than remedying health state, unspecified: Secondary | ICD-10-CM

## 2016-05-05 HISTORY — PX: ANTERIOR CERVICAL DECOMP/DISCECTOMY FUSION: SHX1161

## 2016-05-05 SURGERY — ANTERIOR CERVICAL DECOMPRESSION/DISCECTOMY FUSION 1 LEVEL
Anesthesia: General | Site: Spine Cervical

## 2016-05-05 MED ORDER — MIDAZOLAM HCL 2 MG/2ML IJ SOLN
INTRAMUSCULAR | Status: AC
  Filled 2016-05-05: qty 2

## 2016-05-05 MED ORDER — PHENYLEPHRINE HCL 10 MG/ML IJ SOLN
INTRAMUSCULAR | Status: AC
  Filled 2016-05-05: qty 1

## 2016-05-05 MED ORDER — VANCOMYCIN HCL IN DEXTROSE 1-5 GM/200ML-% IV SOLN
1000.0000 mg | Freq: Once | INTRAVENOUS | Status: AC
  Administered 2016-05-05: 1000 mg via INTRAVENOUS
  Filled 2016-05-05: qty 200

## 2016-05-05 MED ORDER — LACTATED RINGERS IV SOLN
INTRAVENOUS | Status: DC | PRN
  Administered 2016-05-05 (×2): via INTRAVENOUS

## 2016-05-05 MED ORDER — PHENOL 1.4 % MT LIQD: 1.0000 | OROMUCOSAL | Status: DC | PRN

## 2016-05-05 MED ORDER — ONDANSETRON HCL 4 MG/2ML IJ SOLN
INTRAMUSCULAR | Status: AC
  Filled 2016-05-05: qty 4

## 2016-05-05 MED ORDER — ONDANSETRON HCL 4 MG PO TABS: 4.0000 mg | ORAL_TABLET | Freq: Four times a day (QID) | ORAL | Status: DC | PRN

## 2016-05-05 MED ORDER — LIDOCAINE 2% (20 MG/ML) 5 ML SYRINGE
INTRAMUSCULAR | Status: AC
  Filled 2016-05-05: qty 5

## 2016-05-05 MED ORDER — SODIUM CHLORIDE 0.9% FLUSH: 3.0000 mL | INTRAVENOUS | Status: DC | PRN

## 2016-05-05 MED ORDER — ROCURONIUM BROMIDE 50 MG/5ML IV SOSY
PREFILLED_SYRINGE | INTRAVENOUS | Status: AC
  Filled 2016-05-05: qty 5

## 2016-05-05 MED ORDER — HYDROMORPHONE HCL 1 MG/ML IJ SOLN: 0.2500 mg | INTRAMUSCULAR | Status: DC | PRN

## 2016-05-05 MED ORDER — VITAMIN E 180 MG (400 UNIT) PO CAPS
400.0000 [IU] | ORAL_CAPSULE | Freq: Every day | ORAL | Status: DC
  Filled 2016-05-05 (×2): qty 1

## 2016-05-05 MED ORDER — EPHEDRINE 5 MG/ML INJ
INTRAVENOUS | Status: AC
  Filled 2016-05-05: qty 10

## 2016-05-05 MED ORDER — LOPERAMIDE HCL 2 MG PO CAPS: 2.0000 mg | ORAL_CAPSULE | Freq: Every day | ORAL | Status: DC | PRN

## 2016-05-05 MED ORDER — PROPOFOL 10 MG/ML IV BOLUS
INTRAVENOUS | Status: DC | PRN
  Administered 2016-05-05: 130 mg via INTRAVENOUS

## 2016-05-05 MED ORDER — HYDROMORPHONE HCL 1 MG/ML IJ SOLN: 0.5000 mg | INTRAMUSCULAR | Status: DC | PRN

## 2016-05-05 MED ORDER — HEMOSTATIC AGENTS (NO CHARGE) OPTIME
TOPICAL | Status: DC | PRN
  Administered 2016-05-05: 1 via TOPICAL

## 2016-05-05 MED ORDER — INDACATEROL-GLYCOPYRROLATE 27.5-15.6 MCG IN CAPS: 2.0000 | ORAL_CAPSULE | Freq: Every day | RESPIRATORY_TRACT | Status: DC

## 2016-05-05 MED ORDER — THROMBIN 5000 UNITS EX SOLR
CUTANEOUS | Status: DC | PRN
  Administered 2016-05-05 (×2): 5000 [IU] via TOPICAL

## 2016-05-05 MED ORDER — EPINEPHRINE 0.3 MG/0.3ML IJ SOAJ: 0.3000 mg | Freq: Every day | INTRAMUSCULAR | Status: DC | PRN

## 2016-05-05 MED ORDER — GUAIFENESIN 100 MG/5ML PO SOLN: 5.0000 mL | ORAL | Status: DC | PRN

## 2016-05-05 MED ORDER — FENTANYL CITRATE (PF) 100 MCG/2ML IJ SOLN
INTRAMUSCULAR | Status: AC
  Filled 2016-05-05: qty 4

## 2016-05-05 MED ORDER — BACITRACIN 50000 UNITS IM SOLR
INTRAMUSCULAR | Status: DC | PRN
  Administered 2016-05-05: 500 mL

## 2016-05-05 MED ORDER — VITAMIN C 500 MG PO TABS
500.0000 mg | ORAL_TABLET | Freq: Every day | ORAL | Status: DC
  Filled 2016-05-05 (×2): qty 1

## 2016-05-05 MED ORDER — ALBUTEROL SULFATE HFA 108 (90 BASE) MCG/ACT IN AERS: 2.0000 | INHALATION_SPRAY | Freq: Four times a day (QID) | RESPIRATORY_TRACT | Status: DC | PRN

## 2016-05-05 MED ORDER — THROMBIN 5000 UNITS EX SOLR
CUTANEOUS | Status: AC
  Filled 2016-05-05: qty 10000

## 2016-05-05 MED ORDER — GUAIFENESIN-CODEINE 100-10 MG/5ML PO SYRP: 5.0000 mL | ORAL_SOLUTION | Freq: Three times a day (TID) | ORAL | Status: DC | PRN

## 2016-05-05 MED ORDER — PHENYLEPHRINE 40 MCG/ML (10ML) SYRINGE FOR IV PUSH (FOR BLOOD PRESSURE SUPPORT)
PREFILLED_SYRINGE | INTRAVENOUS | Status: AC
  Filled 2016-05-05: qty 10

## 2016-05-05 MED ORDER — SUGAMMADEX SODIUM 200 MG/2ML IV SOLN
INTRAVENOUS | Status: AC
  Filled 2016-05-05: qty 2

## 2016-05-05 MED ORDER — MENTHOL 3 MG MT LOZG: 1.0000 | LOZENGE | OROMUCOSAL | Status: DC | PRN

## 2016-05-05 MED ORDER — FENTANYL CITRATE (PF) 100 MCG/2ML IJ SOLN
INTRAMUSCULAR | Status: DC | PRN
  Administered 2016-05-05 (×4): 25 ug via INTRAVENOUS
  Administered 2016-05-05: 100 ug via INTRAVENOUS

## 2016-05-05 MED ORDER — DIPHENHYDRAMINE HCL 50 MG/ML IJ SOLN
25.0000 mg | Freq: Four times a day (QID) | INTRAMUSCULAR | Status: DC | PRN
  Filled 2016-05-05: qty 0.5

## 2016-05-05 MED ORDER — ALBUTEROL SULFATE (2.5 MG/3ML) 0.083% IN NEBU
2.5000 mg | INHALATION_SOLUTION | Freq: Four times a day (QID) | RESPIRATORY_TRACT | Status: DC | PRN
  Administered 2016-05-05: 2.5 mg via RESPIRATORY_TRACT
  Filled 2016-05-05: qty 3

## 2016-05-05 MED ORDER — SODIUM CHLORIDE 0.9% FLUSH
3.0000 mL | Freq: Two times a day (BID) | INTRAVENOUS | Status: DC
  Administered 2016-05-05: 3 mL via INTRAVENOUS

## 2016-05-05 MED ORDER — DEXAMETHASONE SODIUM PHOSPHATE 10 MG/ML IJ SOLN
INTRAMUSCULAR | Status: AC
  Filled 2016-05-05: qty 1

## 2016-05-05 MED ORDER — PROPOFOL 10 MG/ML IV BOLUS
INTRAVENOUS | Status: AC
  Filled 2016-05-05: qty 40

## 2016-05-05 MED ORDER — PROMETHAZINE HCL 25 MG/ML IJ SOLN: 6.2500 mg | INTRAMUSCULAR | Status: DC | PRN

## 2016-05-05 MED ORDER — HYDROCODONE-ACETAMINOPHEN 5-325 MG PO TABS
1.0000 | ORAL_TABLET | ORAL | Status: DC | PRN
  Administered 2016-05-05: 1 via ORAL
  Filled 2016-05-05: qty 2

## 2016-05-05 MED ORDER — HYDROMORPHONE HCL 1 MG/ML IJ SOLN
INTRAMUSCULAR | Status: AC
  Administered 2016-05-05: 0.5 mg
  Filled 2016-05-05: qty 0.5

## 2016-05-05 MED ORDER — ROCURONIUM BROMIDE 10 MG/ML (PF) SYRINGE
PREFILLED_SYRINGE | INTRAVENOUS | Status: DC | PRN
  Administered 2016-05-05: 10 mg via INTRAVENOUS
  Administered 2016-05-05: 40 mg via INTRAVENOUS

## 2016-05-05 MED ORDER — MESALAMINE 1.2 G PO TBEC
3.6000 g | DELAYED_RELEASE_TABLET | Freq: Every day | ORAL | Status: DC
  Filled 2016-05-05: qty 3

## 2016-05-05 MED ORDER — SUGAMMADEX SODIUM 200 MG/2ML IV SOLN
INTRAVENOUS | Status: DC | PRN
  Administered 2016-05-05: 150 mg via INTRAVENOUS

## 2016-05-05 MED ORDER — ONDANSETRON HCL 4 MG/2ML IJ SOLN
4.0000 mg | Freq: Four times a day (QID) | INTRAMUSCULAR | Status: DC | PRN
  Administered 2016-05-05: 4 mg via INTRAVENOUS
  Filled 2016-05-05: qty 2

## 2016-05-05 MED ORDER — 0.9 % SODIUM CHLORIDE (POUR BTL) OPTIME
TOPICAL | Status: DC | PRN
  Administered 2016-05-05: 1000 mL

## 2016-05-05 MED ORDER — ALBUTEROL SULFATE (2.5 MG/3ML) 0.083% IN NEBU: 2.5000 mg | INHALATION_SOLUTION | Freq: Once | RESPIRATORY_TRACT | Status: DC

## 2016-05-05 MED ORDER — LIDOCAINE 2% (20 MG/ML) 5 ML SYRINGE
INTRAMUSCULAR | Status: DC | PRN
  Administered 2016-05-05: 60 mg via INTRAVENOUS

## 2016-05-05 MED ORDER — LACTATED RINGERS IV SOLN: INTRAVENOUS | Status: DC

## 2016-05-05 MED ORDER — IPRATROPIUM-ALBUTEROL 0.5-2.5 (3) MG/3ML IN SOLN: 3.0000 mL | Freq: Four times a day (QID) | RESPIRATORY_TRACT | Status: DC | PRN

## 2016-05-05 MED ORDER — SODIUM CHLORIDE 0.9 % IV SOLN: 250.0000 mL | INTRAVENOUS | Status: DC

## 2016-05-05 MED ORDER — DEXAMETHASONE SODIUM PHOSPHATE 10 MG/ML IJ SOLN
INTRAMUSCULAR | Status: DC | PRN
  Administered 2016-05-05: 10 mg via INTRAVENOUS

## 2016-05-05 MED ORDER — CYCLOBENZAPRINE HCL 10 MG PO TABS
10.0000 mg | ORAL_TABLET | Freq: Three times a day (TID) | ORAL | Status: DC | PRN
  Administered 2016-05-05 – 2016-05-06 (×3): 10 mg via ORAL
  Filled 2016-05-05 (×3): qty 1

## 2016-05-05 SURGICAL SUPPLY — 55 items
BAG DECANTER FOR FLEXI CONT (MISCELLANEOUS) ×2 IMPLANT
BENZOIN TINCTURE PRP APPL 2/3 (GAUZE/BANDAGES/DRESSINGS) ×2 IMPLANT
BIT DRILL 13 (BIT) ×2 IMPLANT
BUR MATCHSTICK NEURO 3.0 LAGG (BURR) ×2 IMPLANT
CANISTER SUCT 3000ML PPV (MISCELLANEOUS) ×2 IMPLANT
CARTRIDGE OIL MAESTRO DRILL (MISCELLANEOUS) ×1 IMPLANT
DIFFUSER DRILL AIR PNEUMATIC (MISCELLANEOUS) ×2 IMPLANT
DRAPE C-ARM 42X72 X-RAY (DRAPES) ×4 IMPLANT
DRAPE LAPAROTOMY 100X72 PEDS (DRAPES) ×2 IMPLANT
DRAPE MICROSCOPE LEICA (MISCELLANEOUS) ×2 IMPLANT
DRAPE POUCH INSTRU U-SHP 10X18 (DRAPES) ×2 IMPLANT
DURAPREP 6ML APPLICATOR 50/CS (WOUND CARE) ×2 IMPLANT
ELECT COATED BLADE 2.86 ST (ELECTRODE) ×2 IMPLANT
ELECT REM PT RETURN 9FT ADLT (ELECTROSURGICAL) ×2
ELECTRODE REM PT RTRN 9FT ADLT (ELECTROSURGICAL) ×1 IMPLANT
GAUZE SPONGE 4X4 12PLY STRL (GAUZE/BANDAGES/DRESSINGS) ×2 IMPLANT
GAUZE SPONGE 4X4 16PLY XRAY LF (GAUZE/BANDAGES/DRESSINGS) ×2 IMPLANT
GLOVE BIOGEL PI IND STRL 7.0 (GLOVE) ×1 IMPLANT
GLOVE BIOGEL PI IND STRL 7.5 (GLOVE) ×1 IMPLANT
GLOVE BIOGEL PI INDICATOR 7.0 (GLOVE) ×1
GLOVE BIOGEL PI INDICATOR 7.5 (GLOVE) ×1
GLOVE ECLIPSE 9.0 STRL (GLOVE) ×2 IMPLANT
GLOVE EXAM NITRILE LRG STRL (GLOVE) IMPLANT
GLOVE EXAM NITRILE XL STR (GLOVE) IMPLANT
GLOVE EXAM NITRILE XS STR PU (GLOVE) IMPLANT
GLOVE SURG SS PI 7.0 STRL IVOR (GLOVE) ×4 IMPLANT
GOWN STRL REUS W/ TWL LRG LVL3 (GOWN DISPOSABLE) IMPLANT
GOWN STRL REUS W/ TWL XL LVL3 (GOWN DISPOSABLE) ×3 IMPLANT
GOWN STRL REUS W/TWL 2XL LVL3 (GOWN DISPOSABLE) IMPLANT
GOWN STRL REUS W/TWL LRG LVL3 (GOWN DISPOSABLE)
GOWN STRL REUS W/TWL XL LVL3 (GOWN DISPOSABLE) ×3
HALTER HD/CHIN CERV TRACTION D (MISCELLANEOUS) ×2 IMPLANT
KIT BASIN OR (CUSTOM PROCEDURE TRAY) ×2 IMPLANT
KIT ROOM TURNOVER OR (KITS) ×2 IMPLANT
NEEDLE SPNL 20GX3.5 QUINCKE YW (NEEDLE) ×2 IMPLANT
NS IRRIG 1000ML POUR BTL (IV SOLUTION) ×2 IMPLANT
OIL CARTRIDGE MAESTRO DRILL (MISCELLANEOUS) ×2
PACK LAMINECTOMY NEURO (CUSTOM PROCEDURE TRAY) ×2 IMPLANT
PAD ARMBOARD 7.5X6 YLW CONV (MISCELLANEOUS) ×6 IMPLANT
PEEK CAGE 7X14X11 (Cage) ×2 IMPLANT
PLATE ELITE VISION 25MM (Plate) ×2 IMPLANT
RUBBERBAND STERILE (MISCELLANEOUS) ×4 IMPLANT
SCREW ST 13X4XST VA NS SPNE (Screw) ×4 IMPLANT
SCREW ST VAR 4 ATL (Screw) ×4 IMPLANT
SPONGE INTESTINAL PEANUT (DISPOSABLE) ×2 IMPLANT
SPONGE SURGIFOAM ABS GEL SZ50 (HEMOSTASIS) ×2 IMPLANT
STRIP CLOSURE SKIN 1/2X4 (GAUZE/BANDAGES/DRESSINGS) ×2 IMPLANT
SUT VIC AB 3-0 SH 8-18 (SUTURE) ×2 IMPLANT
SUT VIC AB 4-0 RB1 18 (SUTURE) ×2 IMPLANT
TAPE CLOTH 4X10 WHT NS (GAUZE/BANDAGES/DRESSINGS) IMPLANT
TAPE CLOTH SURG 4X10 WHT LF (GAUZE/BANDAGES/DRESSINGS) ×2 IMPLANT
TOWEL GREEN STERILE (TOWEL DISPOSABLE) ×2 IMPLANT
TOWEL GREEN STERILE FF (TOWEL DISPOSABLE) ×2 IMPLANT
TRAP SPECIMEN MUCOUS 40CC (MISCELLANEOUS) ×2 IMPLANT
WATER STERILE IRR 1000ML POUR (IV SOLUTION) ×2 IMPLANT

## 2016-05-05 NOTE — Anesthesia Postprocedure Evaluation (Signed)
Anesthesia Post Note  Patient: Antonio [name]  Procedure(s) Performed: Procedure(s) (LRB): ANTERIOR CERVICAL DECOMPRESSION FUSION CERVICAL THREE-FOUR. (N/A)  Patient location during evaluation: PACU Anesthesia Type: General Level of consciousness: awake and alert Pain management: pain level controlled Vital Signs Assessment: post-procedure vital signs reviewed and stable Respiratory status: spontaneous breathing, nonlabored ventilation, respiratory function stable and patient connected to nasal cannula oxygen Cardiovascular status: blood pressure returned to baseline and stable Postop Assessment: no signs of nausea or vomiting Anesthetic complications: no       Last Vitals:  Vitals:   05/05/16 1113 05/05/16 1130  BP:  (!) 172/82  Pulse: 62 70  Resp: 13 18  Temp: (!) 36 C 36.7 C    Last Pain:  Vitals:   05/05/16 1108  TempSrc:   PainSc: Tyler Deis

## 2016-05-05 NOTE — Brief Op Note (Signed)
05/05/2016  9:53 AM  PATIENT:  Antonio [name]  71 y.o. male  PRE-OPERATIVE DIAGNOSIS:  Stenosis  POST-OPERATIVE DIAGNOSIS:  Stenosis  PROCEDURE:  Procedure(s) with comments: ANTERIOR CERVICAL DECOMPRESSION FUSION CERVICAL THREE-FOUR. (N/A) - right side approach  SURGEON:  Surgeon(s) and Role:    * Earnie Larsson, MD - Primary  PHYSICIAN ASSISTANT:   ASSISTANTS:    ANESTHESIA:   general  EBL:  Total I/O In: 1000 [I.V.:1000] Out: 75 [Blood:75]  BLOOD ADMINISTERED:none  DRAINS: none   LOCAL MEDICATIONS USED:  NONE  SPECIMEN:  No Specimen  DISPOSITION OF SPECIMEN:  N/A  COUNTS:  YES  TOURNIQUET:  * No tourniquets in log *  DICTATION: .Dragon Dictation  PLAN OF CARE: Admit to inpatient   PATIENT DISPOSITION:  PACU - hemodynamically stable.   Delay start of Pharmacological VTE agent (>24hrs) due to surgical blood loss or risk of bleeding: yes

## 2016-05-05 NOTE — H&P (Signed)
Antonio [name] is an 71 y.o. male.   Chief Complaint: Weakness HPI: 71 year old male with progressive bilateral upper and lower extremity weakness, sensory loss and gait abnormality.  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  Patient presents now for anterior cervical discectomy and fusion in hopes of improving his symptoms.  Past Medical History:  Diagnosis Date  . Allergy   . Calf pain   . COPD (chronic obstructive pulmonary disease) ([place])   . COPD (chronic obstructive pulmonary disease) ([place])   . Dyspnea   . Hypokalemia   . Internal hemorrhoids   . Muscle cramp   . Muscle pain   . Pneumonia   . PONV (postoperative nausea and vomiting)   . Tubular adenoma of colon   . Ulcerative colitis [facility])     Past Surgical History:  Procedure Laterality Date  . COLONOSCOPY    . [place]  . leg stent Left approx 5-6 yrs ago   2 to left leg  . MENISCUS REPAIR Left 12/2013  . ROTATOR CUFF REPAIR Left 2007  . TONSILLECTOMY AND ADENOIDECTOMY  1957    Family History  Problem Relation Age of Onset  . Cancer Mother     leukemia  . Heart disease Father   . Heart attack Father   . Healthy Daughter   . Healthy Son   . Colon cancer Neg Hx    Social History:  reports that he quit smoking about 4 years ago. His smoking use included Cigarettes and Cigars. He has a 26.25 pack-year smoking history. He has never used smokeless tobacco. He reports that he drinks about 4.2 oz of alcohol per week . He reports that he does not use drugs.  Allergies:  Allergies  Allergen Reactions  . Penicillins Rash    Has patient had a PCN reaction causing immediate rash, facial/tongue/throat swelling, SOB or lightheadedness with hypotension:Yes Has patient had a PCN reaction causing severe rash involving mucus membranes or skin necrosis:Yes Has patient had a PCN reaction that required  hospitalization:No Has patient had a PCN reaction occurring within the last 10 years:No If all of the above answers are "NO", then may proceed with Cephalosporin use.     Facility-Administered Medications Prior to Admission  Medication Dose Route Frequency Provider Last Rate Last Dose  . albuterol (PROVENTIL) (2.5 MG/3ML) 0.083% nebulizer solution 2.5 mg  2.5 mg Nebulization Once [name], MD       Medications Prior to Admission  Medication Sig Dispense Refill  . albuterol (PROVENTIL HFA;VENTOLIN HFA) 108 (90 BASE) MCG/ACT inhaler Inhale 2 puffs into the lungs every 6 (six) hours as needed for wheezing or shortness of breath. 1 Inhaler 5  . aspirin EC 81 MG tablet Take 81 mg by mouth daily.    Marland Kitchen EPINEPHrine 0.3 mg/0.3 mL IJ SOAJ injection Inject 0.3 mLs (0.3 mg total) into the muscle once. (Patient taking differently: Inject 0.3 mg into the muscle daily as needed (for anaphylatic reaction.). ) 1 Device 1  . Indacaterol-Glycopyrrolate (UTIBRON NEOHALER) 27.5-15.6 MCG CAPS Place 2 puffs into inhaler and inhale daily. 30 capsule 0  . ipratropium-albuterol (DUONEB) 0.5-2.5 (3) MG/3ML SOLN INHALE 1 VIAL VIA NEBULIZER EVERY 6 HOURS AS NEEDED FOR SHORTNESS OF BREATH 360 mL 0  . loperamide (IMODIUM) 2 MG capsule Take 2 mg by mouth daily as needed for diarrhea or loose stools.     Marland Kitchen MAGNESIUM PO Take 1  tablet by mouth daily.    . mesalamine (LIALDA) 1.2 g EC tablet Take 3 tablets (3.6 g total) by mouth daily with breakfast. 270 tablet 1  . vitamin C (ASCORBIC ACID) 500 MG tablet Take 500 mg by mouth daily.    . vitamin E 400 UNIT capsule Take 400 Units by mouth daily.    Marland Kitchen albuterol (PROVENTIL) (2.5 MG/3ML) 0.083% nebulizer solution Take 3 mLs (2.5 mg total) by nebulization every 6 (six) hours as needed for wheezing or shortness of breath. 75 mL 12  . guaiFENesin-codeine (ROBITUSSIN AC) 100-10 MG/5ML syrup Take 5 mLs by mouth 3 (three) times daily as needed for cough. 150 mL 0  . Respiratory  Therapy Supplies (FLUTTER) DEVI Use as directed 1 each 0  . Tiotropium Bromide-Olodaterol (STIOLTO RESPIMAT) 2.5-2.5 MCG/ACT AERS Inhale 2 puffs into the lungs daily. 2 Inhaler 0  . Tiotropium Bromide-Olodaterol (STIOLTO RESPIMAT) 2.5-2.5 MCG/ACT AERS Inhale 2 puffs into the lungs daily. 3 Inhaler 3    No results found for this or any previous visit (from the past 48 hour(s)). No results found.  Pertinent items noted in HPI and remainder of comprehensive ROS otherwise negative.  Blood pressure (!) 160/83, pulse 60, temperature 98.2 F (36.8 C), temperature source Oral, resp. rate 16, height 5' 10.5" (1.791 m), weight 67.1 kg (148 lb), SpO2 98 %. Patient is awake and alert.  Oriented ad appropriate.tion normal bilaterally.  Motor examination extremities reveals weakness of his grips and intrinsic functions in both hands.  He has overall spasticity in both upper and lowerxtremities.  Sensory examination reveals patchy distal sensory loss in both upper extremities.  Reflexes are increased.  Hoffmann's responses are present in both hands.  Reflexes are mildly increased.  Gait is spastic.  Examination head ears eyes nose and throat is unremarkable.  Chest and abdomen are benign.  Extremities are free of major deformity. 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  Plan C3-4 anterior cervical discectomy with interbody fusion utilizing interbody peek cage, locally harvested autograft, and anterior plate instrumentation.  Risks and benefits explained.  Patient wishes to proceed.  Shalana Jardin A 05/05/2016, 7:33 AM

## 2016-05-05 NOTE — Anesthesia Procedure Notes (Signed)
Procedure Name: Intubation Date/Time: 05/05/2016 8:57 AM Performed by: Merdis Delay Pre-anesthesia Checklist: Patient identified, Emergency Drugs available, Suction available, Patient being monitored and Timeout performed Patient Re-evaluated:Patient Re-evaluated prior to inductionOxygen Delivery Method: Circle system utilized Preoxygenation: Pre-oxygenation with 100% oxygen Intubation Type: IV induction Ventilation: Mask ventilation without difficulty Laryngoscope Size: Mac and 4 Grade View: Grade II Tube type: Oral Tube size: 7.5 mm Number of attempts: 1 Airway Equipment and Method: Stylet Placement Confirmation: ETT inserted through vocal cords under direct vision,  positive ETCO2 and breath sounds checked- equal and bilateral Secured at: 22 cm Tube secured with: Tape Dental Injury: Teeth and Oropharynx as per pre-operative assessment

## 2016-05-05 NOTE — Progress Notes (Signed)
Pharmacy Antibiotic Note  Antonio [name] is a 71 y.o. male admitted on 05/05/2016 with surgical prophylaxis.  Pharmacy has been consulted for vancomycin dosing.  Confirmed with chart and RN Caryl Pina that patient does not have a drain. Pre-op vancomycin 1g given this morning at 0847.   Plan: Vancomycin 1g IV x1 to be given tonight at [place] to sign off as no further doses required. Please re-consult if needed.  Height: 5' 10.5" (179.1 cm) Weight: 148 lb (67.1 kg) IBW/kg (Calculated) : 74.15  Temp (24hrs), Avg:97.6 F (36.4 C), Min:96.8 F (36 C), Max:98.2 F (36.8 C)   Recent Labs Lab 04/28/16 1433  WBC 8.7  CREATININE 0.97    Estimated Creatinine Clearance: 64.4 mL/min (by C-G formula based on SCr of 0.97 mg/dL).    Allergies  Allergen Reactions  . Penicillins Rash    Has patient had a PCN reaction causing immediate rash, facial/tongue/throat swelling, SOB or lightheadedness with hypotension:Yes Has patient had a PCN reaction causing severe rash involving mucus membranes or skin necrosis:Yes Has patient had a PCN reaction that required hospitalization:No Has patient had a PCN reaction occurring within the last 10 years:No If all of the above answers are "NO", then may proceed with Cephalosporin use.      Thank you for allowing pharmacy to be a part of this patient's care.  Gibbs Naugle 05/05/2016 11:47 AM

## 2016-05-05 NOTE — Op Note (Signed)
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  Asst. Surgeon: None  Anesthesia: General  Indication: 71 year old male with bilateral upper extremity numbness and weakness in both upper extremities. Patient also with some gait spasticity. Workup demonstrates evidence of marked spinal cord compression secondary to multifactorial stenosis at C3-4. Patient presents now for anterior cervical decompression infusion in hopes of improving his symptoms.  Operative note: After induction of anesthesia, patient position supine with neck slightly extended and held in place of halter traction. Patient's anterior cervical region prepped and draped sterilely. Incision made overlying C3-4. Dissection performed on the right. Retractor placed. Fluoroscopy used. Levels confirmed. Disc space and size 15 blade. Discectomy performed with various instruments down to level the posterior annulus. Microscope then brought into field use at the remainder of the discectomy. Remaining aspects of annulus and osteophytes removed using high-speed drill down to level posterior longitudinal ligament. Posterior longitudinal ligament was elevated and resected piecemeal fashion using Kerrison rongeurs. Underlying thecal sac was then identified. A wide central decompression was then performed by undercutting the bodies of C3 and C4. Decompression then proceeded into each neural foramen. Wide anterior foraminotomies were performed on course exiting C4 nerve roots bilaterally. At this point a very thorough decompression been achieved. There was no evidence injury to thecal sac or nerve roots. Wound  is then irrigated MI solution. A 7 mm peek cage was packed with locally harvested autograft. This then impacted into place and recessed slightly from the anterior cortical margin. 25 mm Atlantis anterior cervical plate was placed over the C3 and C4 level. This an attachment or fluoroscopic guidance using 13 monitor variable-angle screws 2 each at both levels. All 4 screws given a final tightening and found to be solidly within bone. Locking screws and gauge at both levels. Final images revealed good position of the cage and the instrumentation at the proper upper level with normal alignment is fine. Wounds and irrigated one final time. Hemostasis was assured with bipolar chart. Wounds and close in layers of Vicryl sutures. Steri-Strips sterile dressing were applied. No apparent complications. Patient tolerated the procedure well and he returns to the recovery room postop.

## 2016-05-05 NOTE — Transfer of Care (Signed)
Immediate Anesthesia Transfer of Care Note  Patient: Antonio [name]  Procedure(s) Performed: Procedure(s) with comments: ANTERIOR CERVICAL DECOMPRESSION FUSION CERVICAL THREE-FOUR. (N/A) - right side approach  Patient Location: PACU  Anesthesia Type:General  Level of Consciousness: awake, alert  and oriented  Airway & Oxygen Therapy: Patient Spontanous Breathing  Post-op Assessment: Report given to RN and Post -op Vital signs reviewed and stable  Post vital signs: Reviewed and stable  Last Vitals:  Vitals:   05/05/16 0627  BP: (!) 160/83  Pulse: 60  Resp: 16  Temp: 36.8 C    Last Pain:  Vitals:   05/05/16 0627  TempSrc: Oral      Patients Stated Pain Goal: 3 (15/40/08 6761)  Complications: No apparent anesthesia complications

## 2016-05-06 ENCOUNTER — Encounter (HOSPITAL_COMMUNITY): Payer: Self-pay | Admitting: Neurosurgery

## 2016-05-06 MED ORDER — TRAMADOL HCL 50 MG PO TABS: 50.0000 mg | ORAL_TABLET | Freq: Four times a day (QID) | ORAL | 0 refills | Status: DC | PRN

## 2016-05-06 MED ORDER — CYCLOBENZAPRINE HCL 10 MG PO TABS: 10.0000 mg | ORAL_TABLET | Freq: Three times a day (TID) | ORAL | 0 refills | Status: DC | PRN

## 2016-05-06 NOTE — Discharge Summary (Signed)
Physician Discharge Summary  Patient ID: Antonio [name] MRN: [id number] DOB/AGE: [birth year] 73 y.o.  Admit date: 05/05/2016 Discharge date: 05/06/2016  Admission Diagnoses:  Discharge Diagnoses:  Active Problems:   Cervical stenosis of spinal canal   Discharged Condition: good  Hospital Course: Patient admitted to the hospital where he underwent uncompensated C3-4 anterior cervical discectomy and fusion. Postoperatively he is doing very well. Preoperative bilateral upper extremity numbness and paresthesias are much improved. Ambulating well. Ready for discharge home.  Consults:   Significant Diagnostic Studies:   Treatments:   Discharge Exam: Blood pressure (!) 142/75, pulse 70, temperature 98.7 F (37.1 C), temperature source Oral, resp. rate 18, height 5' 10.5" (1.791 m), weight 67.1 kg (148 lb), SpO2 96 %. Awake and alert. Oriented and appropriate. Counter function intact. Motor and sensory function extremities normal. Wound clean and dry. Chest and abdomen benign.  Disposition: Final discharge disposition not confirmed   Allergies as of 05/06/2016      Reactions   Penicillins Rash   Has patient had a PCN reaction causing immediate rash, facial/tongue/throat swelling, SOB or lightheadedness with hypotension:Yes Has patient had a PCN reaction causing severe rash involving mucus membranes or skin necrosis:Yes Has patient had a PCN reaction that required hospitalization:No Has patient had a PCN reaction occurring within the last 10 years:No If all of the above answers are "NO", then may proceed with Cephalosporin use.      Medication List    TAKE these medications   albuterol 108 (90 Base) MCG/ACT inhaler Commonly known as:  PROVENTIL HFA;VENTOLIN HFA Inhale 2 puffs into the lungs every 6 (six) hours as needed for wheezing or shortness of breath.   albuterol (2.5 MG/3ML) 0.083% nebulizer solution Commonly known as:  PROVENTIL Take 3 mLs (2.5 mg total) by  nebulization every 6 (six) hours as needed for wheezing or shortness of breath.   aspirin EC 81 MG tablet Take 81 mg by mouth daily.   cyclobenzaprine 10 MG tablet Commonly known as:  FLEXERIL Take 1 tablet (10 mg total) by mouth 3 (three) times daily as needed for muscle spasms.   EPINEPHrine 0.3 mg/0.3 mL Soaj injection Commonly known as:  EPI-PEN Inject 0.3 mLs (0.3 mg total) into the muscle once. What changed:  when to take this  reasons to take this   FLUTTER Devi Use as directed   guaiFENesin-codeine 100-10 MG/5ML syrup Commonly known as:  ROBITUSSIN AC Take 5 mLs by mouth 3 (three) times daily as needed for cough.   Indacaterol-Glycopyrrolate 27.5-15.6 MCG Caps Commonly known as:  Reginia Forts Place 2 puffs into inhaler and inhale daily.   ipratropium-albuterol 0.5-2.5 (3) MG/3ML Soln Commonly known as:  DUONEB INHALE 1 VIAL VIA NEBULIZER EVERY 6 HOURS AS NEEDED FOR SHORTNESS OF BREATH   loperamide 2 MG capsule Commonly known as:  IMODIUM Take 2 mg by mouth daily as needed for diarrhea or loose stools.   MAGNESIUM PO Take 1 tablet by mouth daily.   mesalamine 1.2 g EC tablet Commonly known as:  LIALDA Take 3 tablets (3.6 g total) by mouth daily with breakfast.   Tiotropium Bromide-Olodaterol 2.5-2.5 MCG/ACT Aers Commonly known as:  STIOLTO RESPIMAT Inhale 2 puffs into the lungs daily.   Tiotropium Bromide-Olodaterol 2.5-2.5 MCG/ACT Aers Commonly known as:  STIOLTO RESPIMAT Inhale 2 puffs into the lungs daily.   traMADol 50 MG tablet Commonly known as:  ULTRAM Take 1 tablet (50 mg total) by mouth every 6 (six) hours as needed for moderate pain.  vitamin C 500 MG tablet Commonly known as:  ASCORBIC ACID Take 500 mg by mouth daily.   vitamin E 400 UNIT capsule Take 400 Units by mouth daily.        Signed: Elmira Olkowski A 05/06/2016, 7:59 AM

## 2016-05-06 NOTE — Discharge Instructions (Signed)

## 2016-05-06 NOTE — Progress Notes (Signed)
Pt doing well. Pt and wife given D/C instructions with Rx's, verbal understanding was provided. Pt's IV was removed prior to D/C. Pt's incision is clean and dry with no sign of infection. Pt D/C'd home via wheelchair @ 1045 per MD order. Pt is stable @ D/C and has no other needs at this time. Holli Humbles, RN

## 2016-05-14 ENCOUNTER — Ambulatory Visit: Payer: Self-pay | Admitting: Internal Medicine

## 2016-05-20 NOTE — Telephone Encounter (Signed)
Error

## 2016-05-28 ENCOUNTER — Other Ambulatory Visit (INDEPENDENT_AMBULATORY_CARE_PROVIDER_SITE_OTHER): Payer: PPO

## 2016-05-28 ENCOUNTER — Encounter: Payer: Self-pay | Admitting: Gastroenterology

## 2016-05-28 ENCOUNTER — Ambulatory Visit (INDEPENDENT_AMBULATORY_CARE_PROVIDER_SITE_OTHER): Payer: PPO | Admitting: Gastroenterology

## 2016-05-28 VITALS — BP 128/70 | HR 76 | Ht 70.0 in | Wt 145.0 lb

## 2016-05-28 DIAGNOSIS — K5100 Ulcerative (chronic) pancolitis without complications: Secondary | ICD-10-CM | POA: Diagnosis not present

## 2016-05-28 DIAGNOSIS — K51919 Ulcerative colitis, unspecified with unspecified complications: Secondary | ICD-10-CM | POA: Diagnosis not present

## 2016-05-28 DIAGNOSIS — K5190 Ulcerative colitis, unspecified, without complications: Secondary | ICD-10-CM | POA: Insufficient documentation

## 2016-05-28 LAB — BASIC METABOLIC PANEL
BUN: 10 mg/dL (ref 6–23)
CALCIUM: 9.3 mg/dL (ref 8.4–10.5)
CHLORIDE: 106 meq/L (ref 96–112)
CO2: 28 meq/L (ref 19–32)
CREATININE: 0.95 mg/dL (ref 0.40–1.50)
GFR: 82.54 mL/min (ref >60.00)
Glucose, Bld: 72 mg/dL (ref 70–99)
Potassium: 3.9 mEq/L (ref 3.5–5.1)
Sodium: 138 mEq/L (ref 135–145)

## 2016-05-28 LAB — CBC WITH DIFFERENTIAL/PLATELET
BASOS PCT: 0.6 % (ref 0.0–3.0)
Basophils Absolute: 0.1 10*3/uL (ref 0.0–0.1)
EOS PCT: 6.7 % — AB (ref 0.0–5.0)
Eosinophils Absolute: 0.7 10*3/uL (ref 0.0–0.7)
HCT: 45.6 % (ref 39.0–52.0)
Hemoglobin: 15.3 g/dL (ref 13.0–17.0)
Lymphocytes Relative: 20.7 % (ref 12.0–46.0)
Lymphs Abs: 2.1 10*3/uL (ref 0.7–4.0)
MCHC: 33.6 g/dL (ref 30.0–36.0)
MCV: 91.1 fl (ref 78.0–100.0)
MONO ABS: 1.4 10*3/uL — AB (ref 0.1–1.0)
Monocytes Relative: 13.5 % — ABNORMAL HIGH (ref 3.0–12.0)
NEUTROS ABS: 5.9 10*3/uL (ref 1.4–7.7)
NEUTROS PCT: 58.5 % (ref 43.0–77.0)
PLATELETS: 277 10*3/uL (ref 150.0–400.0)
RBC: 5.01 Mil/uL (ref 4.22–5.81)
RDW: 13.1 % (ref 11.5–15.5)
WBC: 10 10*3/uL (ref 4.0–10.5)

## 2016-05-28 LAB — C-REACTIVE PROTEIN: CRP: 0.6 mg/dL (ref 0.5–20.0)

## 2016-05-28 NOTE — Patient Instructions (Addendum)
Your physician has requested that you go to the basement for the following lab work before leaving today: CBC, BMET, CRP  Increase Lialda to 4 pills daily. Use Canasa suppository at bedtime.  Normal BMI (Body Mass Index- based on height and weight) is between 23 and 30. Your BMI today is Body mass index is 20.81 kg/m. Marland Kitchen Please consider follow up  regarding your BMI with your Primary Care Provider.  Thank you

## 2016-05-28 NOTE — Progress Notes (Addendum)
05/28/2016 Antonio [name] [id number] [birth year]   HISTORY OF PRESENT ILLNESS:  This is a pleasant 71 year old male who is known to Dr. Hilarie Fredrickson for treatment of his ulcerative colitis. Was initially diagnosed in July 2015 with findings as follows:  Records from Dr. Allen Norris   Colonoscopy 09/09/2013 -- severe proctitis in the rectum and distal rectum. Biopsies obtained. 3 mm cecal polyp removed. 3 sessile polyps in the ascending colon ranging 4-6 mm in size removed. 4 mm transverse colon polyp removed. Pathology = rectal biopsies -- marked active colitis with architectural features of chronicity. Negative for dysplasia. Ascending colon polyps -- tubular adenoma 2 and polypoid fragment of colonic mucosa with moderate active colitis. Cecal polyp -- early hyperplastic polyp. Transverse colon polyp -- moderate active colitis.  Has been treated with mesalamine in the form of Lialda, which he gets from [place]. Had been taking 2.4 g daily. Had repeat colonoscopy by Dr. Hilarie Fredrickson in June 2017 at which time he was found to have no evidence of colitis or proctitis. He did have a few polyps removed at that time, however. He says that from June 17 until about one and half months ago he was doing well on the Lialda 2.4 g daily. Then about a month and half ago he began having some mucus and what sounds like tenesmus. Says that he has the sensation to move his bowels but when when he goes sometimes he just passes mucus and sometimes nothing comes out. He denies any rectal bleeding except for very very small amounts that he contributes to hemorrhoids. He denies diarrhea.   Past Medical History:  Diagnosis Date  . Allergy   . Calf pain   . COPD (chronic obstructive pulmonary disease) ([place])   . COPD (chronic obstructive pulmonary disease) ([place])   . Dyspnea   . Hypokalemia   . Internal hemorrhoids   . Muscle cramp   . Muscle pain   . Pneumonia   . PONV (postoperative nausea and vomiting)   . Tubular  adenoma of colon   . Ulcerative colitis [facility])    Past Surgical History:  Procedure Laterality Date  . ANTERIOR CERVICAL DECOMP/DISCECTOMY FUSION N/A 05/05/2016   Procedure: ANTERIOR CERVICAL DECOMPRESSION FUSION CERVICAL THREE-FOUR.;  Surgeon: Earnie Larsson, MD;  Location: [facility];  Service: Neurosurgery;  Laterality: N/A;  right side approach  . CERVICAL DISCECTOMY  04/2016   C3-C4  . COLONOSCOPY    . [place]  . leg stent Left approx 5-6 yrs ago   2 to left leg  . MENISCUS REPAIR Left 12/2013  . ROTATOR CUFF REPAIR Left 2007  . TONSILLECTOMY AND ADENOIDECTOMY  1957    reports that he quit smoking about 4 years ago. His smoking use included Cigarettes and Cigars. He has a 26.25 pack-year smoking history. He has never used smokeless tobacco. He reports that he drinks about 4.2 oz of alcohol per week . He reports that he does not use drugs. family history includes Cancer in his mother; Healthy in his daughter and son; Heart attack in his father; Heart disease in his father. Allergies  Allergen Reactions  . Penicillins Rash    Has patient had a PCN reaction causing immediate rash, facial/tongue/throat swelling, SOB or lightheadedness with hypotension:Yes Has patient had a PCN reaction causing severe rash involving mucus membranes or skin necrosis:Yes Has patient had a PCN reaction that required hospitalization:No Has patient had a PCN reaction occurring within the last 10 years:No If all of  the above answers are "NO", then may proceed with Cephalosporin use.       Outpatient Encounter Prescriptions as of 05/28/2016  Medication Sig  . albuterol (PROVENTIL HFA;VENTOLIN HFA) 108 (90 BASE) MCG/ACT inhaler Inhale 2 puffs into the lungs every 6 (six) hours as needed for wheezing or shortness of breath.  Marland Kitchen albuterol (PROVENTIL) (2.5 MG/3ML) 0.083% nebulizer solution Take 3 mLs (2.5 mg total) by nebulization every 6 (six) hours as needed for wheezing or shortness of breath.  Marland Kitchen  aspirin EC 81 MG tablet Take 81 mg by mouth daily.  Marland Kitchen EPINEPHrine 0.3 mg/0.3 mL IJ SOAJ injection Inject 0.3 mLs (0.3 mg total) into the muscle once. (Patient taking differently: Inject 0.3 mg into the muscle daily as needed (for anaphylatic reaction.). )  . Indacaterol-Glycopyrrolate (UTIBRON NEOHALER) 27.5-15.6 MCG CAPS Place 2 puffs into inhaler and inhale daily.  Marland Kitchen ipratropium-albuterol (DUONEB) 0.5-2.5 (3) MG/3ML SOLN INHALE 1 VIAL VIA NEBULIZER EVERY 6 HOURS AS NEEDED FOR SHORTNESS OF BREATH  . loperamide (IMODIUM) 2 MG capsule Take 2 mg by mouth daily as needed for diarrhea or loose stools.   . mesalamine (LIALDA) 1.2 g EC tablet Take 3 tablets (3.6 g total) by mouth daily with breakfast.  . Respiratory Therapy Supplies (FLUTTER) DEVI Use as directed  . Tiotropium Bromide-Olodaterol (STIOLTO RESPIMAT) 2.5-2.5 MCG/ACT AERS Inhale 2 puffs into the lungs daily.  . Tiotropium Bromide-Olodaterol (STIOLTO RESPIMAT) 2.5-2.5 MCG/ACT AERS Inhale 2 puffs into the lungs daily.  . traMADol (ULTRAM) 50 MG tablet Take 1 tablet (50 mg total) by mouth every 6 (six) hours as needed for moderate pain.  . [DISCONTINUED] cyclobenzaprine (FLEXERIL) 10 MG tablet Take 1 tablet (10 mg total) by mouth 3 (three) times daily as needed for muscle spasms.  . [DISCONTINUED] guaiFENesin-codeine (ROBITUSSIN AC) 100-10 MG/5ML syrup Take 5 mLs by mouth 3 (three) times daily as needed for cough.  . [DISCONTINUED] MAGNESIUM PO Take 1 tablet by mouth daily.  . [DISCONTINUED] vitamin C (ASCORBIC ACID) 500 MG tablet Take 500 mg by mouth daily.  . [DISCONTINUED] vitamin E 400 UNIT capsule Take 400 Units by mouth daily.   Facility-Administered Encounter Medications as of 05/28/2016  Medication  . albuterol (PROVENTIL) (2.5 MG/3ML) 0.083% nebulizer solution 2.5 mg     REVIEW OF SYSTEMS  : All other systems reviewed and negative except where noted in the History of Present Illness.   PHYSICAL EXAM: BP 128/70   Pulse 76   Ht  5' 10"  (1.778 m)   Wt 145 lb (65.8 kg)   BMI 20.81 kg/m  General: Well developed white male in no acute distress Head: Normocephalic and atraumatic Eyes:  Sclerae anicteric, conjunctiva pink. Ears: Normal auditory acuity Lungs: Clear throughout to auscultation Heart: Regular rate and rhythm Abdomen: Soft, non-distended. Normal bowel sounds.  Non-tender. Musculoskeletal: Symmetrical with no gross deformities  Skin: No lesions on visible extremities Extremities: No edema  Neurological: Alert oriented x 4, grossly non-focal Psychological:  Alert and cooperative. Normal mood and affect  ASSESSMENT AND PLAN: -Ulcerative colitis:  Seems to possibly be having a mild flare. I'm going to have him increase his Lialda to 4.8 g daily. He has Canasa suppositories at home already as well so I have asked him to use that once daily at bedtime. Does not seem to warrant prednisone/steroids at this point. I'll check a CBC, BMP, CRP. I'll bring him back for follow-up with Dr. Hilarie Fredrickson in 4-6 weeks. He can certainly call back sooner if symptoms team to  be worsening in the meantime. 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  He was asking about medication formulary. He gets his Chad from [place], which he says is manufactured in [place]. Looking at his medication formulary it appears the only other thing that would be less expensive would be balsalazide.  CC:  [name], MD  Addendum: Reviewed and agree with initial management. Jerene Bears, MD

## 2016-06-03 ENCOUNTER — Ambulatory Visit: Payer: Self-pay | Admitting: Internal Medicine

## 2016-06-12 DIAGNOSIS — M4802 Spinal stenosis, cervical region: Secondary | ICD-10-CM | POA: Diagnosis not present

## 2016-06-17 ENCOUNTER — Ambulatory Visit (INDEPENDENT_AMBULATORY_CARE_PROVIDER_SITE_OTHER): Payer: PPO | Admitting: Family Medicine

## 2016-06-17 ENCOUNTER — Encounter: Payer: Self-pay | Admitting: Family Medicine

## 2016-06-17 VITALS — BP 130/80 | HR 68 | Ht 70.0 in | Wt 145.0 lb

## 2016-06-17 DIAGNOSIS — T700XXA Otitic barotrauma, initial encounter: Secondary | ICD-10-CM | POA: Diagnosis not present

## 2016-06-17 DIAGNOSIS — H748X2 Other specified disorders of left middle ear and mastoid: Secondary | ICD-10-CM

## 2016-06-17 DIAGNOSIS — J301 Allergic rhinitis due to pollen: Secondary | ICD-10-CM | POA: Diagnosis not present

## 2016-06-17 MED ORDER — PREDNISONE 10 MG PO TABS: 10.0000 mg | ORAL_TABLET | Freq: Every day | ORAL | 0 refills | Status: DC

## 2016-06-17 MED ORDER — FLUTICASONE PROPIONATE 50 MCG/ACT NA SUSP: 2.0000 | Freq: Every day | NASAL | 6 refills | Status: DC

## 2016-06-17 NOTE — Patient Instructions (Signed)
Barotitis Media Barotitis media is inflammation of the middle ear. This condition occurs when an auditory tube (eustachian tube) is blocked in one or both ears. These tubes lead from the middle ear to the back of the nose (nasopharynx). This condition typically occurs when you experience changes in pressure, such as when flying or scuba diving. Untreated barotitis media may lead to damage or hearing loss (barotrauma), which may become permanent. What are the causes? This condition may be caused by changes in air pressure from:  Flying.  Scuba diving.  A nearby explosion. What increases the risk? The following factors may make you more likely to develop this condition:  Middle ear infection.  Sinus infection.  A cold.  Environmental allergies.  Small eustachian tubes.  Recent ear surgery. What are the signs or symptoms? Symptoms of this condition may include:  Ear pain.  Hearing loss. In severe cases, symptoms can include:  Dizziness and nausea (vertigo).  Temporary facial paralysis. How is this diagnosed? This condition is diagnosed based on:  A physical exam. Your health care provider may:  Use a device (otoscope) to look into your ear canal and check your eardrum.  Do a test that changes air pressure in the middle ear to check how well the eardrum moves and to see if the eustachian tube is working(tympanogram).  Your medical history. In some cases, your health care provider may have you take a hearing test. You may also be referred to someone who specializes in ear treatment (otolaryngologist, "ENT"). How is this treated? This condition may be treated with:  Medicines to relieve congestion in your nose, sinus, or upper respiratory tract (decongestants).  Techniques to equalize pressure (to "pop" your ears), such as:  Yawning.  Chewing gum.  Swallowing. In severe cases, you may need surgery to relieve your symptoms or to prevent future inflammation. Follow  these instructions at home:  Take over-the-counter and prescription medicines only as told by your health care provider.  Do not put anything into your ears to clean or unplug them. Ear drops will not help.  Keep all follow-up visits as told by your health care provider. This is important. How is this prevented? Using these strategies may help to prevent barotitis media:  Chewing gum with frequent, forceful swallowing during takeoff and landing when flying.  Holding your nose and gently blowing to pop your ears for equalizing pressure changes. This forces air into the eustachian tube.  Yawning during air pressure changes.  Using a nasal decongestant about 30-60 minutes before flying, if you have nasal congestion. Contact a health care provider if:  You have vertigo.  You have hearing loss.  Your symptoms do not get better or they get worse.  You have a fever. Get help right away if:  You have a severe headache, ear pain, and dizziness.  You have balance problems.  You cannot move or feel part of your face.  You have bloody or pus-like drainage from your ears. Summary  Barotitis media is inflammation of the middle ear.  This condition typically occurs when you experience changes in pressure, such as when flying or scuba diving.  You may be at a higher risk for this condition if you have small eustachian tubes, had recent ear surgery, or have allergies, a cold, or sinus or middle ear infection.  This condition may be treated with medicines or techniques to equalize pressure in your ears.  Strategies can be used to help prevent barotitis media. This information is  not intended to replace advice given to you by your health care provider. Make sure you discuss any questions you have with your health care provider. Document Released: 01/25/2000 Document Revised: 12/17/2015 Document Reviewed: 12/17/2015 Elsevier Interactive Patient Education  2017 [employer].

## 2016-06-17 NOTE — Progress Notes (Signed)
Name: Antonio [name]   MRN: [id number]    DOB: Jan 31, 1946   Date:06/17/2016       Progress Note  Subjective  Chief Complaint  Chief Complaint  Patient presents with  . Ear Pain    L) earache- started with allergies last week    Otalgia   There is pain in the left ear. This is a new problem. The current episode started in the past 7 days. The problem occurs constantly. The problem has been gradually worsening. There has been no fever. The pain is at a severity of 3/10. The pain is mild. Associated symptoms include hearing loss and rhinorrhea. Pertinent negatives include no abdominal pain, coughing, diarrhea, ear discharge, headaches, neck pain, rash or sore throat. He has tried nothing for the symptoms. His past medical history is significant for a tympanostomy tube.    No problem-specific Assessment & Plan notes found for this encounter.   Past Medical History:  Diagnosis Date  . Allergy   . Calf pain   . COPD (chronic obstructive pulmonary disease) ([place])   . COPD (chronic obstructive pulmonary disease) ([place])   . Dyspnea   . Hypokalemia   . Internal hemorrhoids   . Muscle cramp   . Muscle pain   . Pneumonia   . PONV (postoperative nausea and vomiting)   . Tubular adenoma of colon   . Ulcerative colitis [facility])     Past Surgical History:  Procedure Laterality Date  . ANTERIOR CERVICAL DECOMP/DISCECTOMY FUSION N/A 05/05/2016   Procedure: ANTERIOR CERVICAL DECOMPRESSION FUSION CERVICAL THREE-FOUR.;  Surgeon: Earnie Larsson, MD;  Location: [place];  Service: Neurosurgery;  Laterality: N/A;  right side approach  . CERVICAL DISCECTOMY  04/2016   C3-C4  . COLONOSCOPY    . [place]  . leg stent Left approx 5-6 yrs ago   2 to left leg  . MENISCUS REPAIR Left 12/2013  . ROTATOR CUFF REPAIR Left 2007  . TONSILLECTOMY AND ADENOIDECTOMY  1957    Family History  Problem Relation Age of Onset  . Cancer Mother     leukemia  . Heart disease Father   . Heart attack  Father   . Healthy Daughter   . Healthy Son   . Colon cancer Neg Hx     Social History   Social History  . Marital status: Married    Spouse name: N/A  . Number of children: 2  . Years of education: N/A   Occupational History  . retired    Social History Main Topics  . Smoking status: Former Smoker    Packs/day: 0.75    Years: 35.00    Types: Cigarettes, Cigars    Quit date: 06/11/2011  . Smokeless tobacco: Never Used     Comment: off and on smoker   . Alcohol use 4.2 oz/week    7 Standard drinks or equivalent per week     Comment: 1-2 day   . Drug use: No  . Sexual activity: Yes   Other Topics Concern  . Not on file   Social History Narrative   Lives with wife in a 2 story home.  Has 2 children.     Retired from [employer] development [employer].     Education: college.     Allergies  Allergen Reactions  . Penicillins Rash    Has patient had a PCN reaction causing immediate rash, facial/tongue/throat swelling, SOB or lightheadedness with hypotension:Yes Has patient had a PCN reaction causing severe rash  involving mucus membranes or skin necrosis:Yes Has patient had a PCN reaction that required hospitalization:No Has patient had a PCN reaction occurring within the last 10 years:No If all of the above answers are "NO", then may proceed with Cephalosporin use.     Outpatient Medications Prior to Visit  Medication Sig Dispense Refill  . albuterol (PROVENTIL HFA;VENTOLIN HFA) 108 (90 BASE) MCG/ACT inhaler Inhale 2 puffs into the lungs every 6 (six) hours as needed for wheezing or shortness of breath. 1 Inhaler 5  . albuterol (PROVENTIL) (2.5 MG/3ML) 0.083% nebulizer solution Take 3 mLs (2.5 mg total) by nebulization every 6 (six) hours as needed for wheezing or shortness of breath. 75 mL 12  . aspirin EC 81 MG tablet Take 81 mg by mouth daily.    Marland Kitchen EPINEPHrine 0.3 mg/0.3 mL IJ SOAJ injection Inject 0.3 mLs (0.3 mg total) into the muscle once. (Patient taking differently: Inject 0.3  mg into the muscle daily as needed (for anaphylatic reaction.). ) 1 Device 1  . Indacaterol-Glycopyrrolate (UTIBRON NEOHALER) 27.5-15.6 MCG CAPS Place 2 puffs into inhaler and inhale daily. 30 capsule 0  . ipratropium-albuterol (DUONEB) 0.5-2.5 (3) MG/3ML SOLN INHALE 1 VIAL VIA NEBULIZER EVERY 6 HOURS AS NEEDED FOR SHORTNESS OF BREATH 360 mL 0  . loperamide (IMODIUM) 2 MG capsule Take 2 mg by mouth daily as needed for diarrhea or loose stools.     . mesalamine (LIALDA) 1.2 g EC tablet Take 3 tablets (3.6 g total) by mouth daily with breakfast. 270 tablet 1  . Respiratory Therapy Supplies (FLUTTER) DEVI Use as directed 1 each 0  . Tiotropium Bromide-Olodaterol (STIOLTO RESPIMAT) 2.5-2.5 MCG/ACT AERS Inhale 2 puffs into the lungs daily. 2 Inhaler 0  . Tiotropium Bromide-Olodaterol (STIOLTO RESPIMAT) 2.5-2.5 MCG/ACT AERS Inhale 2 puffs into the lungs daily. 3 Inhaler 3  . traMADol (ULTRAM) 50 MG tablet Take 1 tablet (50 mg total) by mouth every 6 (six) hours as needed for moderate pain. 30 tablet 0   Facility-Administered Medications Prior to Visit  Medication Dose Route Frequency Provider Last Rate Last Dose  . albuterol (PROVENTIL) (2.5 MG/3ML) 0.083% nebulizer solution 2.5 mg  2.5 mg Nebulization Once [name], MD        Review of Systems  Constitutional: Negative for chills, fever, malaise/fatigue and weight loss.  HENT: Positive for ear pain, hearing loss and rhinorrhea. Negative for ear discharge and sore throat.   Eyes: Negative for blurred vision.  Respiratory: Negative for cough, sputum production, shortness of breath and wheezing.   Cardiovascular: Negative for chest pain, palpitations and leg swelling.  Gastrointestinal: Negative for abdominal pain, blood in stool, constipation, diarrhea, heartburn, melena and nausea.  Genitourinary: Negative for dysuria, frequency, hematuria and urgency.  Musculoskeletal: Negative for back pain, joint pain, myalgias and neck pain.  Skin:  Negative for rash.  Neurological: Negative for dizziness, tingling, sensory change, focal weakness and headaches.  Endo/Heme/Allergies: Negative for environmental allergies and polydipsia. Does not bruise/bleed easily.  Psychiatric/Behavioral: Negative for depression and suicidal ideas. The patient is not nervous/anxious and does not have insomnia.      Objective  Vitals:   06/17/16 1113  BP: 130/80  Pulse: 68  Weight: 145 lb (65.8 kg)  Height: 5' 10"  (1.778 m)    Physical Exam  Constitutional: He is oriented to person, place, and time and well-developed, well-nourished, and in no distress.  HENT:  Head: Normocephalic.  Right Ear: External ear normal. Tympanic membrane is retracted.  Left Ear: External ear  normal. There is hemotympanum.  Nose: Nose normal.  Mouth/Throat: Oropharynx is clear and moist.  Eyes: Conjunctivae and EOM are normal. Pupils are equal, round, and reactive to light. Right eye exhibits no discharge. Left eye exhibits no discharge. No scleral icterus.  Neck: Normal range of motion. Neck supple. No JVD present. No tracheal deviation present. No thyromegaly present.  Cardiovascular: Normal rate, regular rhythm, normal heart sounds and intact distal pulses.  Exam reveals no gallop and no friction rub.   No murmur heard. Pulmonary/Chest: Breath sounds normal. No respiratory distress. He has no wheezes. He has no rales.  Abdominal: Soft. Bowel sounds are normal. He exhibits no mass. There is no hepatosplenomegaly. There is no tenderness. There is no rebound, no guarding and no CVA tenderness.  Musculoskeletal: Normal range of motion. He exhibits no edema or tenderness.  Lymphadenopathy:    He has no cervical adenopathy.  Neurological: He is alert and oriented to person, place, and time. He has normal sensation, normal strength, normal reflexes and intact cranial nerves. No cranial nerve deficit.  Skin: Skin is warm. No rash noted.  Psychiatric: Mood and affect  normal.  Vitals reviewed.     Assessment & Plan  Problem List Items Addressed This Visit    None    Visit Diagnoses    Hemotympanum, left    -  Primary   Relevant Medications   fluticasone (FLONASE) 50 MCG/ACT nasal spray   predniSONE (DELTASONE) 10 MG tablet   Barotitis media, initial encounter       sudafed 63m qid   Relevant Medications   fluticasone (FLONASE) 50 MCG/ACT nasal spray   predniSONE (DELTASONE) 10 MG tablet      Meds ordered this encounter  Medications  . fluticasone (FLONASE) 50 MCG/ACT nasal spray    Sig: Place 2 sprays into both nostrils daily.    Dispense:  16 g    Refill:  6  . predniSONE (DELTASONE) 10 MG tablet    Sig: Take 1 tablet (10 mg total) by mouth daily with breakfast.    Dispense:  14 tablet    Refill:  0   Refer to Dr MTami Ribasfor eval today in [place]  [facility]  06/17/16

## 2016-07-10 ENCOUNTER — Ambulatory Visit (INDEPENDENT_AMBULATORY_CARE_PROVIDER_SITE_OTHER): Payer: PPO | Admitting: Internal Medicine

## 2016-07-10 ENCOUNTER — Encounter: Payer: Self-pay | Admitting: Internal Medicine

## 2016-07-10 VITALS — BP 130/76 | HR 70 | Ht 71.0 in | Wt 143.0 lb

## 2016-07-10 DIAGNOSIS — K5190 Ulcerative colitis, unspecified, without complications: Secondary | ICD-10-CM | POA: Diagnosis not present

## 2016-07-10 DIAGNOSIS — H6980 Other specified disorders of Eustachian tube, unspecified ear: Secondary | ICD-10-CM | POA: Diagnosis not present

## 2016-07-10 DIAGNOSIS — Z86010 Personal history of colonic polyps: Secondary | ICD-10-CM

## 2016-07-10 DIAGNOSIS — H903 Sensorineural hearing loss, bilateral: Secondary | ICD-10-CM | POA: Diagnosis not present

## 2016-07-10 DIAGNOSIS — H90A22 Sensorineural hearing loss, unilateral, left ear, with restricted hearing on the contralateral side: Secondary | ICD-10-CM | POA: Diagnosis not present

## 2016-07-10 MED ORDER — MESALAMINE 1000 MG RE SUPP: 1000.0000 mg | Freq: Every day | RECTAL | 1 refills | Status: DC

## 2016-07-10 MED ORDER — MESALAMINE 1.2 G PO TBEC: 4.8000 g | DELAYED_RELEASE_TABLET | Freq: Every day | ORAL | 1 refills | Status: DC

## 2016-07-10 NOTE — Patient Instructions (Signed)
We have given you a prescription for Lialda 4.8 grams daily (although ideally, we would like you to take 2.4 grams or 2 tablets daily).  We have given you a prescription for canasa to take once every night.  You may take imodium 2 mg tablets, up to 4 tablets daily.  Please follow up with Dr Hilarie Fredrickson in 6 months.  If you are age 73 or older, your body mass index should be between 23-30. Your Body mass index is 19.94 kg/m. If this is out of the aforementioned range listed, please consider follow up with your Primary Care Provider.  If you are age 22 or younger, your body mass index should be between 19-25. Your Body mass index is 19.94 kg/m. If this is out of the aformentioned range listed, please consider follow up with your Primary Care Provider.

## 2016-07-10 NOTE — Progress Notes (Signed)
Subjective:    Patient ID: Antonio [name], male    DOB: [birth year], 71 y.o.   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 He was last seen on 05/28/2016 by Alonza Bogus, PA-C. At that time there is concern about a flare of his colitis due to increase in stool frequency, looser stools becoming more urgent. Some mild tenesmus. Stools were nonbloody. There is no abdominal pain. Lialda was increased from 2.4-4.8 g daily Canasa suppository was added back daily at bedtime. Surgery on C3-C4 and reports this went very well. The numbness and tingling in his left arm and hand have resolved completely and improved by 70% in his right hand.  He reports he responded very well to increase Lialda in addition of Canasa. Bowel movements have returned to normal for him which is about one formed stool a day. He does take an Imodium 2 mg after his initial bowel movement each day. This is to avoid further stools which are looser urgent throughout the day. He's had no further flare type symptoms. Still no abdominal pain or blood per rectum.  His wife describes occasionally prior to eating he will feel upper abdominal pressure and seemed to have difficulty breathing. He treats this by using a nebulizer and symptoms resolved. Again this occurs prior to eating. He plans to discuss this with Dr. Lake Bells at upcoming follow-up  Review of Systems As per history of present illness, otherwise negative  Current Medications, Allergies, Past Medical History, Past Surgical History, Family History and Social History were reviewed in [employer] record.     Objective:   Physical Exam BP 130/76   Pulse 70   Ht 5' 11"  (1.803 m)   Wt 143 lb (64.9 kg)   BMI 19.94 kg/m  Constitutional: Well-developed and well-nourished. No distress. HEENT: Normocephalic and  atraumatic.   No scleral icterus. Neck: Neck supple. Trachea midline. Cardiovascular: Normal rate, regular rhythm and intact distal pulses. No M/R/G Pulmonary/chest: Effort normal and distant breath sounds normal. No wheezing, rales or rhonchi. Abdominal: Soft, nontender, nondistended. Bowel sounds active throughout.  Extremities: no clubbing, cyanosis, or edema Neurological: Alert and oriented to person place and time. Skin: Skin is warm and dry. Psychiatric: Normal mood and affect. Behavior is normal.  CBC    Component Value Date/Time   WBC 10.0 05/28/2016 1450   RBC 5.01 05/28/2016 1450   HGB 15.3 05/28/2016 1450   HGB 16.1 10/01/2013 2131   HCT 45.6 05/28/2016 1450   HCT 43.9 11/26/2015 0933   PLT 277.0 05/28/2016 1450   PLT 247 11/26/2015 0933   MCV 91.1 05/28/2016 1450   MCV 90 11/26/2015 0933   MCV 95 10/01/2013 2131   MCH 31.0 04/28/2016 1433   MCHC 33.6 05/28/2016 1450   RDW 13.1 05/28/2016 1450   RDW 13.6 11/26/2015 0933   RDW 12.4 10/01/2013 2131   LYMPHSABS 2.1 05/28/2016 1450   MONOABS 1.4 (H) 05/28/2016 1450   EOSABS 0.7 05/28/2016 1450   BASOSABS 0.1 05/28/2016 1450   CMP     Component Value Date/Time   NA 138 05/28/2016 1450   NA 143 11/26/2015 0933   NA 138 10/01/2013 2131   K 3.9 05/28/2016 1450   K 4.2 10/01/2013 2131   CL 106 05/28/2016 1450   CL 107 10/01/2013 2131   CO2 28 05/28/2016 1450   CO2  23 10/01/2013 2131   GLUCOSE 72 05/28/2016 1450   GLUCOSE 118 (H) 10/01/2013 2131   BUN 10 05/28/2016 1450   BUN 11 11/26/2015 0933   BUN 11 10/01/2013 2131   CREATININE 0.95 05/28/2016 1450   CREATININE 0.93 10/01/2013 2131   CALCIUM 9.3 05/28/2016 1450   CALCIUM 8.7 10/01/2013 2131   PROT 7.3 04/28/2016 1433   PROT 7.3 11/26/2015 0933   ALBUMIN 4.0 04/28/2016 1433   ALBUMIN 4.4 11/26/2015 0933   AST 37 04/28/2016 1433   ALT 33 04/28/2016 1433   ALKPHOS 70 04/28/2016 1433   BILITOT 0.5 04/28/2016 1433   BILITOT 0.8 11/26/2015 0933    GFRNONAA >60 04/28/2016 1433   GFRNONAA >60 10/01/2013 2131   GFRAA >60 04/28/2016 1433   GFRAA >60 10/01/2013 2131   Last colonoscopy reviewed from 08/09/2015. 3 polyps removed. Internal hemorrhoids. No evidence of active colitis. 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  1. Ulcerative colitis -- possible flare about 6 weeks ago. Symptoms have completely resolved. I'm also suspicious for an irritable bowel type component to some of his symptoms specifically loose stools and urgency. At this point he has decrease Lialda to 3.6 grams daily. He can likely decrease to 2.4 g daily. He can still use Canasa suppositories at bedtime if needed for tenesmus or rectal discomfort. Also okay for Imodium 2-4 mg if needed for loose stools.  2. History of adenomatous colon polyps -- surveillance colonoscopy recommended June 2020  3. Abdominal pressure with shortness of breath -- he will discuss this symptom with Dr. Lake Bells as this abates with nebulizer treatment. If Dr. Lake Bells feels this may be abdominal related symptom we can work this up further with consideration of cross-sectional imaging and or EGD. He is not having trouble swallowing, nausea, vomiting or early satiety.  Six-month office follow-up, sooner if needed 25 minutes spent with the patient today. Greater than 50% was spent in counseling and coordination of care with the patient

## 2016-07-11 ENCOUNTER — Encounter: Payer: Self-pay | Admitting: Gastroenterology

## 2016-07-11 ENCOUNTER — Encounter: Payer: Self-pay | Admitting: Internal Medicine

## 2016-07-11 ENCOUNTER — Encounter: Payer: Self-pay | Admitting: Neurology

## 2016-07-17 DIAGNOSIS — M4802 Spinal stenosis, cervical region: Secondary | ICD-10-CM | POA: Diagnosis not present

## 2016-07-23 ENCOUNTER — Telehealth: Payer: Self-pay | Admitting: Internal Medicine

## 2016-07-23 ENCOUNTER — Encounter: Payer: Self-pay | Admitting: Internal Medicine

## 2016-07-23 MED ORDER — MESALAMINE 1000 MG RE SUPP: 1000.0000 mg | Freq: Every day | RECTAL | 1 refills | Status: DC

## 2016-07-23 MED ORDER — MESALAMINE 1.2 G PO TBEC: 4.8000 g | DELAYED_RELEASE_TABLET | Freq: Every day | ORAL | 1 refills | Status: DC

## 2016-07-23 NOTE — Telephone Encounter (Signed)
Left message for patient to call back. The scripts we gave him were original scripts with original signatures. I am unsure what else we can give him.

## 2016-07-23 NOTE — Telephone Encounter (Signed)
Patient states that someone wrote "COPY" above the scripts given to him at his office visit with Dr Hilarie Fredrickson last time. I personally gave him these prescriptions and do not recall seeing this so not sure where this came from, but we have created new scripts, have had Dr Hilarie Fredrickson to sign the scripts and have mailed these to his home address.

## 2016-09-26 ENCOUNTER — Encounter: Payer: Self-pay | Admitting: Gastroenterology

## 2016-09-29 ENCOUNTER — Other Ambulatory Visit: Payer: Self-pay

## 2016-09-29 ENCOUNTER — Ambulatory Visit (INDEPENDENT_AMBULATORY_CARE_PROVIDER_SITE_OTHER): Payer: PPO | Admitting: Family Medicine

## 2016-09-29 ENCOUNTER — Encounter: Payer: Self-pay | Admitting: Family Medicine

## 2016-09-29 VITALS — BP 120/70 | HR 160 | Ht 71.0 in | Wt 141.0 lb

## 2016-09-29 DIAGNOSIS — R0609 Other forms of dyspnea: Secondary | ICD-10-CM | POA: Diagnosis not present

## 2016-09-29 DIAGNOSIS — Z720 Tobacco use: Secondary | ICD-10-CM | POA: Diagnosis not present

## 2016-09-29 DIAGNOSIS — R0600 Dyspnea, unspecified: Secondary | ICD-10-CM | POA: Diagnosis not present

## 2016-09-29 DIAGNOSIS — J449 Chronic obstructive pulmonary disease, unspecified: Secondary | ICD-10-CM

## 2016-09-29 DIAGNOSIS — H748X2 Other specified disorders of left middle ear and mastoid: Secondary | ICD-10-CM

## 2016-09-29 DIAGNOSIS — R000 Tachycardia, unspecified: Secondary | ICD-10-CM | POA: Diagnosis not present

## 2016-09-29 DIAGNOSIS — R0689 Other abnormalities of breathing: Secondary | ICD-10-CM | POA: Diagnosis not present

## 2016-09-29 DIAGNOSIS — T700XXA Otitic barotrauma, initial encounter: Secondary | ICD-10-CM

## 2016-09-29 MED ORDER — DOXYCYCLINE HYCLATE 100 MG PO TABS: 100.0000 mg | ORAL_TABLET | Freq: Two times a day (BID) | ORAL | 0 refills | Status: DC

## 2016-09-29 MED ORDER — ALBUTEROL SULFATE (2.5 MG/3ML) 0.083% IN NEBU: 2.5000 mg | INHALATION_SOLUTION | Freq: Four times a day (QID) | RESPIRATORY_TRACT | 12 refills | Status: DC | PRN

## 2016-09-29 MED ORDER — PREDNISONE 10 MG PO TABS: 10.0000 mg | ORAL_TABLET | Freq: Every day | ORAL | 0 refills | Status: DC

## 2016-09-29 NOTE — Progress Notes (Signed)
Name: Antonio [name]   MRN: [id number]    DOB: 1945/08/27   Date:09/29/2016       Progress Note  Subjective  Chief Complaint  Chief Complaint  Patient presents with  . COPD    flare    Shortness of Breath  This is a recurrent problem. The current episode started in the past 7 days. The problem occurs constantly. The problem has been gradually worsening. Associated symptoms include a sore throat, sputum production and wheezing. Pertinent negatives include no abdominal pain, chest pain, claudication, coryza, ear pain, fever, headaches, hemoptysis, leg pain, leg swelling, neck pain, orthopnea, PND, rash, rhinorrhea, swollen glands, syncope or vomiting. The symptoms are aggravated by any activity. He has tried beta agonist inhalers and ipratropium inhalers for the symptoms. The treatment provided mild relief. His past medical history is significant for COPD. There is no history of asthma, CAD, DVT or a heart failure.    No problem-specific Assessment & Plan notes found for this encounter.   Past Medical History:  Diagnosis Date  . Allergy   . Calf pain   . COPD (chronic obstructive pulmonary disease) ([place])   . COPD (chronic obstructive pulmonary disease) ([place])   . Dyspnea   . Hypokalemia   . Internal hemorrhoids   . Muscle cramp   . Muscle pain   . Pneumonia   . PONV (postoperative nausea and vomiting)   . Tubular adenoma of colon   . Ulcerative colitis [facility])     Past Surgical History:  Procedure Laterality Date  . ANTERIOR CERVICAL DECOMP/DISCECTOMY FUSION N/A 05/05/2016   Procedure: ANTERIOR CERVICAL DECOMPRESSION FUSION CERVICAL THREE-FOUR.;  Surgeon: Earnie Larsson, MD;  Location: [place];  Service: Neurosurgery;  Laterality: N/A;  right side approach  . CERVICAL DISCECTOMY  04/2016   C3-C4  . COLONOSCOPY    . [place]  . leg stent Left approx 5-6 yrs ago   2 to left leg  . MENISCUS REPAIR Left 12/2013  . ROTATOR CUFF REPAIR Left 2007  . TONSILLECTOMY  AND ADENOIDECTOMY  1957    Family History  Problem Relation Age of Onset  . Cancer Mother        leukemia  . Heart disease Father   . Heart attack Father   . Healthy Daughter   . Healthy Son   . Colon cancer Neg Hx     Social History   Social History  . Marital status: Married    Spouse name: N/A  . Number of children: 2  . Years of education: N/A   Occupational History  . retired    Social History Main Topics  . Smoking status: Former Smoker    Packs/day: 0.75    Years: 35.00    Types: Cigarettes, Cigars    Quit date: 06/11/2011  . Smokeless tobacco: Never Used     Comment: off and on smoker   . Alcohol use 4.2 oz/week    7 Standard drinks or equivalent per week     Comment: 1-2 day   . Drug use: No  . Sexual activity: Yes   Other Topics Concern  . Not on file   Social History Narrative   Lives with wife in a 2 story home.  Has 2 children.     Retired from [employer] development officer.     Education: college.     Allergies  Allergen Reactions  . Penicillins Rash    Has patient had a PCN reaction causing immediate rash,  facial/tongue/throat swelling, SOB or lightheadedness with hypotension:Yes Has patient had a PCN reaction causing severe rash involving mucus membranes or skin necrosis:Yes Has patient had a PCN reaction that required hospitalization:No Has patient had a PCN reaction occurring within the last 10 years:No If all of the above answers are "NO", then may proceed with Cephalosporin use.     Outpatient Medications Prior to Visit  Medication Sig Dispense Refill  . albuterol (PROVENTIL HFA;VENTOLIN HFA) 108 (90 BASE) MCG/ACT inhaler Inhale 2 puffs into the lungs every 6 (six) hours as needed for wheezing or shortness of breath. 1 Inhaler 5  . albuterol (PROVENTIL) (2.5 MG/3ML) 0.083% nebulizer solution Take 3 mLs (2.5 mg total) by nebulization every 6 (six) hours as needed for wheezing or shortness of breath. 75 mL 12  . aspirin EC 81 MG tablet Take 81 mg by  mouth daily.    Marland Kitchen EPINEPHrine 0.3 mg/0.3 mL IJ SOAJ injection Inject 0.3 mLs (0.3 mg total) into the muscle once. (Patient taking differently: Inject 0.3 mg into the muscle daily as needed (for anaphylatic reaction.). ) 1 Device 1  . fluticasone (FLONASE) 50 MCG/ACT nasal spray Place 2 sprays into both nostrils daily. 16 g 6  . Indacaterol-Glycopyrrolate (UTIBRON NEOHALER) 27.5-15.6 MCG CAPS Place 2 puffs into inhaler and inhale daily. 30 capsule 0  . ipratropium-albuterol (DUONEB) 0.5-2.5 (3) MG/3ML SOLN INHALE 1 VIAL VIA NEBULIZER EVERY 6 HOURS AS NEEDED FOR SHORTNESS OF BREATH 360 mL 0  . loperamide (IMODIUM) 2 MG capsule Take 2 mg by mouth daily as needed for diarrhea or loose stools.     . mesalamine (CANASA) 1000 MG suppository Place 1 suppository (1,000 mg total) rectally at bedtime. 90 suppository 1  . mesalamine (LIALDA) 1.2 g EC tablet Take 4 tablets (4.8 g total) by mouth daily with breakfast. 360 tablet 1  . predniSONE (DELTASONE) 10 MG tablet Take 1 tablet (10 mg total) by mouth daily with breakfast. 14 tablet 0  . Respiratory Therapy Supplies (FLUTTER) DEVI Use as directed 1 each 0  . Tiotropium Bromide-Olodaterol (STIOLTO RESPIMAT) 2.5-2.5 MCG/ACT AERS Inhale 2 puffs into the lungs daily. 2 Inhaler 0   Facility-Administered Medications Prior to Visit  Medication Dose Route Frequency Provider Last Rate Last Dose  . albuterol (PROVENTIL) (2.5 MG/3ML) 0.083% nebulizer solution 2.5 mg  2.5 mg Nebulization Once [name], MD        Review of Systems  Constitutional: Negative for chills, fever, malaise/fatigue and weight loss.  HENT: Positive for sore throat. Negative for ear discharge, ear pain and rhinorrhea.   Eyes: Negative for blurred vision.  Respiratory: Positive for sputum production, shortness of breath and wheezing. Negative for cough and hemoptysis.   Cardiovascular: Negative for chest pain, palpitations, orthopnea, claudication, leg swelling, syncope and PND.   Gastrointestinal: Negative for abdominal pain, blood in stool, constipation, diarrhea, heartburn, melena, nausea and vomiting.  Genitourinary: Negative for dysuria, frequency, hematuria and urgency.  Musculoskeletal: Negative for back pain, joint pain, myalgias and neck pain.  Skin: Negative for rash.  Neurological: Negative for dizziness, tingling, sensory change, focal weakness and headaches.  Endo/Heme/Allergies: Negative for environmental allergies and polydipsia. Does not bruise/bleed easily.  Psychiatric/Behavioral: Negative for depression and suicidal ideas. The patient is not nervous/anxious and does not have insomnia.      Objective  Vitals:   09/29/16 0832  BP: 120/70  Pulse: (!) 160  SpO2: 96%  Weight: 141 lb (64 kg)  Height: 5' 11"  (1.803 m)    Physical  Exam  Constitutional: He is oriented to person, place, and time and well-developed, well-nourished, and in no distress.  HENT:  Head: Normocephalic.  Right Ear: External ear normal.  Left Ear: External ear normal.  Nose: Nose normal.  Mouth/Throat: Oropharynx is clear and moist.  Eyes: Pupils are equal, round, and reactive to light. Conjunctivae and EOM are normal. Right eye exhibits no discharge. Left eye exhibits no discharge. No scleral icterus.  Neck: Normal range of motion. Neck supple. No JVD present. No tracheal deviation present. No thyromegaly present.  Cardiovascular: Normal rate, regular rhythm, normal heart sounds and intact distal pulses.  Exam reveals no gallop and no friction rub.   No murmur heard. Pulmonary/Chest: No respiratory distress. He has wheezes. He has rales.  Abdominal: Soft. Bowel sounds are normal. He exhibits no mass. There is no hepatosplenomegaly. There is no tenderness. There is no rebound, no guarding and no CVA tenderness.  Musculoskeletal: Normal range of motion. He exhibits no edema or tenderness.  Lymphadenopathy:    He has no cervical adenopathy.  Neurological: He is alert and  oriented to person, place, and time. He has normal sensation, normal strength, normal reflexes and intact cranial nerves. No cranial nerve deficit.  Skin: Skin is warm. No rash noted.  Psychiatric: Mood and affect normal.  Nursing note and vitals reviewed.     Assessment & Plan  Problem List Items Addressed This Visit      Respiratory   Centrilobular Emphysema    Other Visit Diagnoses    Tachycardia    -  Primary   ? atrial fib/flutter   Relevant Orders   EKG 12-Lead (Completed)   Ambulatory referral to Cardiology   Dyspnea on exertion       ? high output failure   Relevant Orders   EKG 12-Lead (Completed)   Ambulatory referral to Cardiology      No orders of the defined types were placed in this encounter.     Dr. [facility]  09/29/16

## 2016-10-03 DIAGNOSIS — R000 Tachycardia, unspecified: Secondary | ICD-10-CM | POA: Diagnosis not present

## 2016-10-15 DIAGNOSIS — R0609 Other forms of dyspnea: Secondary | ICD-10-CM | POA: Diagnosis not present

## 2016-10-17 DIAGNOSIS — J449 Chronic obstructive pulmonary disease, unspecified: Secondary | ICD-10-CM | POA: Diagnosis not present

## 2016-10-17 DIAGNOSIS — I4892 Unspecified atrial flutter: Secondary | ICD-10-CM | POA: Insufficient documentation

## 2016-10-17 DIAGNOSIS — I471 Supraventricular tachycardia, unspecified: Secondary | ICD-10-CM | POA: Insufficient documentation

## 2016-10-17 DIAGNOSIS — I493 Ventricular premature depolarization: Secondary | ICD-10-CM | POA: Diagnosis not present

## 2016-11-05 ENCOUNTER — Ambulatory Visit (INDEPENDENT_AMBULATORY_CARE_PROVIDER_SITE_OTHER): Payer: PPO

## 2016-11-05 DIAGNOSIS — Z23 Encounter for immunization: Secondary | ICD-10-CM | POA: Diagnosis not present

## 2016-11-20 ENCOUNTER — Other Ambulatory Visit: Payer: Self-pay

## 2016-11-24 ENCOUNTER — Encounter: Payer: Self-pay | Admitting: Family Medicine

## 2016-11-26 ENCOUNTER — Encounter: Payer: Self-pay | Admitting: Pulmonary Disease

## 2016-11-26 ENCOUNTER — Ambulatory Visit (INDEPENDENT_AMBULATORY_CARE_PROVIDER_SITE_OTHER): Payer: PPO | Admitting: Pulmonary Disease

## 2016-11-26 VITALS — BP 126/64 | HR 60 | Ht 71.0 in | Wt 143.0 lb

## 2016-11-26 DIAGNOSIS — J449 Chronic obstructive pulmonary disease, unspecified: Secondary | ICD-10-CM | POA: Diagnosis not present

## 2016-11-26 MED ORDER — ALBUTEROL SULFATE HFA 108 (90 BASE) MCG/ACT IN AERS: 2.0000 | INHALATION_SPRAY | Freq: Four times a day (QID) | RESPIRATORY_TRACT | 3 refills | Status: DC | PRN

## 2016-11-26 MED ORDER — BUDESONIDE-FORMOTEROL FUMARATE 160-4.5 MCG/ACT IN AERO: 2.0000 | INHALATION_SPRAY | Freq: Every day | RESPIRATORY_TRACT | 3 refills | Status: DC

## 2016-11-26 NOTE — Progress Notes (Signed)
Subjective:    Patient ID: Antonio [name], male    DOB: 22-Nov-1945, 71 y.o.   MRN: [id number]  Synopsis: He was referred for shortness of breath in 2016. He had a lengthy smoking history but quit smoking in 2014. April 2016 pulmonary function testing confirmed COPD which was severe with an FEV1 of 1.47 L (40% predicted). December 2017 lung function testing showed ratio 50%, FEV1 1.47 L 43% predicted, FVC 2.96 L 67% predicted  HPI Chief Complaint  Patient presents with  . Follow-up    pt has good and bad days, does note seasonal allergies. pt's PCP switched to Symbicort from [place] and is doing better on this.     Antonio [name] had his cervical spine surgery an it went well.  No respiratory problems around the time of surgery.   Breathing: some days are good, some days aren't.  He can work in the yard some days unloading mulch, other days he can't.  High humidity and heat bother him a lot.  He sometimes notices a runny nose and itchy eyes when he is around some vegetation around his home and this will make him more short of breath and he needs albuterol.  No infections since the last visit.  In the latter part of August he had an episode with his heart racing to 160 beats per minute and he was wheezing more.  He was switched to Symbicort from [employer].  He feels that symbicort   Past Medical History:  Diagnosis Date  . Allergy   . Calf pain   . COPD (chronic obstructive pulmonary disease) ([place])   . COPD (chronic obstructive pulmonary disease) ([place])   . Dyspnea   . Hypokalemia   . Internal hemorrhoids   . Muscle cramp   . Muscle pain   . Pneumonia   . PONV (postoperative nausea and vomiting)   . Tubular adenoma of colon   . Ulcerative colitis ([place])       Review of Systems  Constitutional: Negative for chills, fatigue and fever.  HENT: Negative for postnasal drip, rhinorrhea and sinus pain.   Respiratory: Positive for cough, shortness of breath and wheezing.   Cardiovascular:  Negative for chest pain, palpitations and leg swelling.       Objective:   Physical Exam  Vitals:   11/26/16 0852  BP: 126/64  Pulse: 60  SpO2: 95%  Weight: 143 lb (64.9 kg)  Height: 5' 11"  (1.803 m)   RA  Gen: well appearing HENT: OP clear, TM's clear, neck supple PULM: CTA B, normal percussion CV: RRR, no mgr, trace edema GI: BS+, soft, nontender Derm: no cyanosis or rash Psyche: normal mood and affect   CBC    Component Value Date/Time   WBC 10.0 05/28/2016 1450   RBC 5.01 05/28/2016 1450   HGB 15.3 05/28/2016 1450   HGB 15.2 11/26/2015 0933   HCT 45.6 05/28/2016 1450   HCT 43.9 11/26/2015 0933   PLT 277.0 05/28/2016 1450   PLT 247 11/26/2015 0933   MCV 91.1 05/28/2016 1450   MCV 90 11/26/2015 0933   MCV 95 10/01/2013 2131   MCH 31.0 04/28/2016 1433   MCHC 33.6 05/28/2016 1450   RDW 13.1 05/28/2016 1450   RDW 13.6 11/26/2015 0933   RDW 12.4 10/01/2013 2131   LYMPHSABS 2.1 05/28/2016 1450   MONOABS 1.4 (H) 05/28/2016 1450   EOSABS 0.7 05/28/2016 1450   BASOSABS 0.1 05/28/2016 1450    Records from his March 2018 hospitalization for  C3-4 anterior cervical discectomy and fusion reviewed, he tolerated the procedure well.      Assessment & Plan:   Centrilobular Emphysema  Discussion: I'm a bit concerned about 1. He's taken the liberty of changing his medications a few times over the last several months and he is currently taking Symbicort and in an orthodox manner with 2 puffs daily.he complains of still having some days of shortness of breath and it sounds as exacerbation COPD in August and saw his primary care physician. I have no record of this today.  I counseled him today that an exacerbation and increasing symptoms are consistent with poorly controlled COPD and because of this he needs to take the Symbicort 2 puffs twice a day, not daily.  We'll have him use his Symbicort with a spacer.'  I'm going to bring him back sooner than typical for him, 3-4  months from now.   Plan: COPD: I am glad you had your flu shot Keep taking Symbicort 2 puffs twice a day through a spacer We will change albuterol HFA inhaler from proAir to Ventolin, 90 day prescription We will see you back in 3-4 months or sooner if needed  > 50% of this 27 minute visit spent face to face   Current Outpatient Prescriptions:  .  albuterol (PROVENTIL) (2.5 MG/3ML) 0.083% nebulizer solution, Take 3 mLs (2.5 mg total) by nebulization every 6 (six) hours as needed for wheezing or shortness of breath., Disp: 75 mL, Rfl: 12 .  aspirin EC 81 MG tablet, Take 81 mg by mouth daily., Disp: , Rfl:  .  budesonide-formoterol (SYMBICORT) 160-4.5 MCG/ACT inhaler, Inhale 2 puffs into the lungs daily., Disp: 3 Inhaler, Rfl: 3 .  EPINEPHrine 0.3 mg/0.3 mL IJ SOAJ injection, Inject 0.3 mLs (0.3 mg total) into the muscle once. (Patient taking differently: Inject 0.3 mg into the muscle daily as needed (for anaphylatic reaction.). ), Disp: 1 Device, Rfl: 1 .  fluticasone (FLONASE) 50 MCG/ACT nasal spray, Place 2 sprays into both nostrils daily., Disp: 16 g, Rfl: 6 .  ipratropium-albuterol (DUONEB) 0.5-2.5 (3) MG/3ML SOLN, INHALE 1 VIAL VIA NEBULIZER EVERY 6 HOURS AS NEEDED FOR SHORTNESS OF BREATH, Disp: 360 mL, Rfl: 0 .  loperamide (IMODIUM) 2 MG capsule, Take 2 mg by mouth daily as needed for diarrhea or loose stools. , Disp: , Rfl:  .  mesalamine (CANASA) 1000 MG suppository, Place 1 suppository (1,000 mg total) rectally at bedtime., Disp: 90 suppository, Rfl: 1 .  mesalamine (LIALDA) 1.2 g EC tablet, Take 4 tablets (4.8 g total) by mouth daily with breakfast., Disp: 360 tablet, Rfl: 1 .  Respiratory Therapy Supplies (FLUTTER) DEVI, Use as directed, Disp: 1 each, Rfl: 0 .  albuterol (PROVENTIL HFA;VENTOLIN HFA) 108 (90 Base) MCG/ACT inhaler, Inhale 2 puffs into the lungs every 6 (six) hours as needed for wheezing or shortness of breath., Disp: 3 Inhaler, Rfl: 3  Current Facility-Administered  Medications:  .  albuterol (PROVENTIL) (2.5 MG/3ML) 0.083% nebulizer solution 2.5 mg, 2.5 mg, Nebulization, Once, [name], MD

## 2016-11-26 NOTE — Patient Instructions (Signed)
COPD: I am glad you had your flu shot Keep taking Symbicort 2 puffs twice a day through a spacer We will change albuterol HFA inhaler from proAir to Ventolin, 90 day prescription We will see you back in 3-4 months or sooner if needed

## 2016-12-01 ENCOUNTER — Ambulatory Visit (INDEPENDENT_AMBULATORY_CARE_PROVIDER_SITE_OTHER): Payer: PPO | Admitting: Family Medicine

## 2016-12-01 ENCOUNTER — Encounter: Payer: Self-pay | Admitting: Family Medicine

## 2016-12-01 VITALS — BP 122/78 | HR 80 | Ht 71.0 in | Wt 145.0 lb

## 2016-12-01 DIAGNOSIS — R351 Nocturia: Secondary | ICD-10-CM | POA: Diagnosis not present

## 2016-12-01 DIAGNOSIS — Z0000 Encounter for general adult medical examination without abnormal findings: Secondary | ICD-10-CM | POA: Diagnosis not present

## 2016-12-01 DIAGNOSIS — R69 Illness, unspecified: Secondary | ICD-10-CM

## 2016-12-01 DIAGNOSIS — Z1211 Encounter for screening for malignant neoplasm of colon: Secondary | ICD-10-CM | POA: Diagnosis not present

## 2016-12-01 LAB — POCT URINALYSIS DIPSTICK
BILIRUBIN UA: NEGATIVE
Glucose, UA: NEGATIVE
KETONES UA: NEGATIVE
Leukocytes, UA: NEGATIVE
Nitrite, UA: NEGATIVE
PH UA: 6 (ref 5.0–8.0)
PROTEIN UA: NEGATIVE
Spec Grav, UA: 1.015 (ref 1.010–1.025)
Urobilinogen, UA: 0.2 E.U./dL

## 2016-12-01 LAB — HEMOCCULT GUIAC POC 1CARD (OFFICE): Fecal Occult Blood, POC: NEGATIVE

## 2016-12-01 NOTE — Progress Notes (Signed)
Name: Antonio [name]   MRN: [id number]    DOB: [birth year]   Date:12/01/2016       Progress Note  Subjective  Chief Complaint  Chief Complaint  Patient presents with  . Annual Exam    everything is UTD    Patient presents for annual physical exam.     No problem-specific Assessment & Plan notes found for this encounter.   Past Medical History:  Diagnosis Date  . Allergy   . Calf pain   . COPD (chronic obstructive pulmonary disease) ([place])   . COPD (chronic obstructive pulmonary disease) ([place])   . Dyspnea   . Hypokalemia   . Internal hemorrhoids   . Muscle cramp   . Muscle pain   . Pneumonia   . PONV (postoperative nausea and vomiting)   . Tubular adenoma of colon   . Ulcerative colitis [facility])     Past Surgical History:  Procedure Laterality Date  . ANTERIOR CERVICAL DECOMP/DISCECTOMY FUSION N/A 05/05/2016   Procedure: ANTERIOR CERVICAL DECOMPRESSION FUSION CERVICAL THREE-FOUR.;  Surgeon: Earnie Larsson, MD;  Location: [place];  Service: Neurosurgery;  Laterality: N/A;  right side approach  . CERVICAL DISCECTOMY  04/2016   C3-C4  . COLONOSCOPY    . [place]  . leg stent Left approx 5-6 yrs ago   2 to left leg  . MENISCUS REPAIR Left 12/2013  . ROTATOR CUFF REPAIR Left 2007  . TONSILLECTOMY AND ADENOIDECTOMY  1957    Family History  Problem Relation Age of Onset  . Cancer Mother        leukemia  . Heart disease Father   . Heart attack Father   . Healthy Daughter   . Healthy Son   . Colon cancer Neg Hx     Social History   Social History  . Marital status: Married    Spouse name: N/A  . Number of children: 2  . Years of education: N/A   Occupational History  . retired    Social History Main Topics  . Smoking status: Former Smoker    Packs/day: 0.75    Years: 35.00    Types: Cigarettes, Cigars    Quit date: 06/11/2011  . Smokeless tobacco: Never Used     Comment: off and on smoker   . Alcohol use 4.2 oz/week    7 Standard drinks  or equivalent per week     Comment: 1-2 day   . Drug use: No  . Sexual activity: Yes   Other Topics Concern  . Not on file   Social History Narrative   Lives with wife in a 2 story home.  Has 2 children.     Retired from [employer].     Education: college.     Allergies  Allergen Reactions  . Penicillins Rash    Has patient had a PCN reaction causing immediate rash, facial/tongue/throat swelling, SOB or lightheadedness with hypotension:Yes Has patient had a PCN reaction causing severe rash involving mucus membranes or skin necrosis:Yes Has patient had a PCN reaction that required hospitalization:No Has patient had a PCN reaction occurring within the last 10 years:No If all of the above answers are "NO", then may proceed with Cephalosporin use.     Outpatient Medications Prior to Visit  Medication Sig Dispense Refill  . albuterol (PROVENTIL HFA;VENTOLIN HFA) 108 (90 Base) MCG/ACT inhaler Inhale 2 puffs into the lungs every 6 (six) hours as needed for wheezing or shortness of breath. 3 Inhaler  3  . albuterol (PROVENTIL) (2.5 MG/3ML) 0.083% nebulizer solution Take 3 mLs (2.5 mg total) by nebulization every 6 (six) hours as needed for wheezing or shortness of breath. 75 mL 12  . aspirin EC 81 MG tablet Take 81 mg by mouth daily.    . budesonide-formoterol (SYMBICORT) 160-4.5 MCG/ACT inhaler Inhale 2 puffs into the lungs daily. 3 Inhaler 3  . EPINEPHrine 0.3 mg/0.3 mL IJ SOAJ injection Inject 0.3 mLs (0.3 mg total) into the muscle once. (Patient taking differently: Inject 0.3 mg into the muscle daily as needed (for anaphylatic reaction.). ) 1 Device 1  . fluticasone (FLONASE) 50 MCG/ACT nasal spray Place 2 sprays into both nostrils daily. 16 g 6  . ipratropium-albuterol (DUONEB) 0.5-2.5 (3) MG/3ML SOLN INHALE 1 VIAL VIA NEBULIZER EVERY 6 HOURS AS NEEDED FOR SHORTNESS OF BREATH 360 mL 0  . loperamide (IMODIUM) 2 MG capsule Take 2 mg by mouth daily as needed for diarrhea or loose  stools.     . mesalamine (CANASA) 1000 MG suppository Place 1 suppository (1,000 mg total) rectally at bedtime. 90 suppository 1  . mesalamine (LIALDA) 1.2 g EC tablet Take 4 tablets (4.8 g total) by mouth daily with breakfast. 360 tablet 1  . Respiratory Therapy Supplies (FLUTTER) DEVI Use as directed (Patient not taking: Reported on 12/01/2016) 1 each 0   Facility-Administered Medications Prior to Visit  Medication Dose Route Frequency Provider Last Rate Last Dose  . albuterol (PROVENTIL) (2.5 MG/3ML) 0.083% nebulizer solution 2.5 mg  2.5 mg Nebulization Once [name], MD        Review of Systems  Constitutional: Negative for chills, fever, malaise/fatigue and weight loss.  HENT: Negative for ear discharge, ear pain and sore throat.   Eyes: Negative for blurred vision.  Respiratory: Negative for cough, sputum production, shortness of breath and wheezing.   Cardiovascular: Negative for chest pain, palpitations and leg swelling.  Gastrointestinal: Negative for abdominal pain, blood in stool, constipation, diarrhea, heartburn, melena and nausea.  Genitourinary: Negative for dysuria, frequency, hematuria and urgency.  Musculoskeletal: Negative for back pain, joint pain, myalgias and neck pain.  Skin: Negative for rash.  Neurological: Negative for dizziness, tingling, sensory change, focal weakness and headaches.  Endo/Heme/Allergies: Negative for environmental allergies and polydipsia. Does not bruise/bleed easily.  Psychiatric/Behavioral: Negative for depression and suicidal ideas. The patient is not nervous/anxious and does not have insomnia.      Objective  Vitals:   12/01/16 0832  BP: 122/78  Pulse: 80  Weight: 145 lb (65.8 kg)  Height: 5' 11"  (1.803 m)    Physical Exam  Constitutional: He is oriented to person, place, and time and well-developed, well-nourished, and in no distress.  HENT:  Head: Normocephalic.  Right Ear: External ear normal.  Left Ear: External ear  normal.  Nose: Nose normal.  Mouth/Throat: Oropharynx is clear and moist.  Eyes: Pupils are equal, round, and reactive to light. Conjunctivae and EOM are normal. Right eye exhibits no discharge. Left eye exhibits no discharge. No scleral icterus.  Neck: Normal range of motion. Neck supple. No JVD present. No tracheal deviation present. No thyromegaly present.  Cardiovascular: Normal rate, regular rhythm, normal heart sounds and intact distal pulses.  Exam reveals no gallop and no friction rub.   No murmur heard. Pulmonary/Chest: Breath sounds normal. No respiratory distress. He has no wheezes. He has no rales.  Abdominal: Soft. Bowel sounds are normal. He exhibits no mass. There is no hepatosplenomegaly. There is no tenderness. There  is no rebound, no guarding and no CVA tenderness.  Musculoskeletal: Normal range of motion. He exhibits no edema or tenderness.  Lymphadenopathy:    He has no cervical adenopathy.  Neurological: He is alert and oriented to person, place, and time. He has normal sensation, normal strength, normal reflexes and intact cranial nerves. No cranial nerve deficit.  Skin: Skin is warm. No rash noted.  Psychiatric: Mood and affect normal.  Nursing note and vitals reviewed.     Assessment & Plan  Problem List Items Addressed This Visit    None    Visit Diagnoses    Annual physical exam    -  Primary   Relevant Orders   POCT urinalysis dipstick (Completed)   PSA   Renal Function Panel   Lipid Profile   POCT Occult Blood Stool (Completed)   Colon cancer screening       Relevant Orders   POCT Occult Blood Stool (Completed)   Taking medication for chronic disease       Relevant Orders   Renal Function Panel   Lipid Profile   Nocturia       Relevant Orders   PSA      No orders of the defined types were placed in this encounter.     Dr. [facility]  12/01/16

## 2016-12-02 ENCOUNTER — Other Ambulatory Visit: Payer: Self-pay

## 2016-12-02 LAB — LIPID PANEL
CHOLESTEROL TOTAL: 151 mg/dL (ref 100–199)
Chol/HDL Ratio: 3 ratio (ref 0.0–5.0)
HDL: 50 mg/dL (ref >39)
LDL CALC: 91 mg/dL (ref 0–99)
TRIGLYCERIDES: 51 mg/dL (ref 0–149)
VLDL Cholesterol Cal: 10 mg/dL (ref 5–40)

## 2016-12-02 LAB — RENAL FUNCTION PANEL
Albumin: 4.4 g/dL (ref 3.5–4.8)
BUN/Creatinine Ratio: 11 (ref 10–24)
BUN: 11 mg/dL (ref 8–27)
CALCIUM: 9.5 mg/dL (ref 8.6–10.2)
CO2: 24 mmol/L (ref 20–29)
CREATININE: 1.03 mg/dL (ref 0.76–1.27)
Chloride: 103 mmol/L (ref 96–106)
GFR calc Af Amer: 83 mL/min/{1.73_m2} (ref >59)
GFR calc non Af Amer: 72 mL/min/{1.73_m2} (ref >59)
Glucose: 85 mg/dL (ref 65–99)
Phosphorus: 2.8 mg/dL (ref 2.5–4.5)
Potassium: 5 mmol/L (ref 3.5–5.2)
SODIUM: 144 mmol/L (ref 134–144)

## 2016-12-02 LAB — PSA: PROSTATE SPECIFIC AG, SERUM: 0.3 ng/mL (ref 0.0–4.0)

## 2017-02-11 DIAGNOSIS — H2513 Age-related nuclear cataract, bilateral: Secondary | ICD-10-CM | POA: Diagnosis not present

## 2017-02-11 DIAGNOSIS — H524 Presbyopia: Secondary | ICD-10-CM | POA: Diagnosis not present

## 2017-02-27 ENCOUNTER — Ambulatory Visit: Payer: PPO | Admitting: Internal Medicine

## 2017-02-27 ENCOUNTER — Encounter: Payer: Self-pay | Admitting: Internal Medicine

## 2017-02-27 VITALS — BP 138/68 | HR 72 | Ht 70.0 in | Wt 142.0 lb

## 2017-02-27 DIAGNOSIS — R1010 Upper abdominal pain, unspecified: Secondary | ICD-10-CM | POA: Diagnosis not present

## 2017-02-27 DIAGNOSIS — Z86010 Personal history of colonic polyps: Secondary | ICD-10-CM | POA: Diagnosis not present

## 2017-02-27 DIAGNOSIS — K5150 Left sided colitis without complications: Secondary | ICD-10-CM | POA: Diagnosis not present

## 2017-02-27 MED ORDER — MESALAMINE 1.2 G PO TBEC: 4.8000 g | DELAYED_RELEASE_TABLET | Freq: Every day | ORAL | 1 refills | Status: DC

## 2017-02-27 MED ORDER — MESALAMINE 1000 MG RE SUPP: 1000.0000 mg | Freq: Every day | RECTAL | 1 refills | Status: DC

## 2017-02-27 NOTE — Patient Instructions (Signed)
We have sent the following medications to your pharmacy for you to pick up at your convenience: Lialda 4.8 g daily  We have given you a prescription for Canasa to send to the pharmacy of your choice.  Please follow up with Dr Hilarie Fredrickson in 6 months.  If you are age 74 or older, your body mass index should be between 23-30. Your Body mass index is 20.37 kg/m. If this is out of the aforementioned range listed, please consider follow up with your Primary Care Provider.  If you are age 60 or younger, your body mass index should be between 19-25. Your Body mass index is 20.37 kg/m. If this is out of the aformentioned range listed, please consider follow up with your Primary Care Provider.

## 2017-02-27 NOTE — Progress Notes (Signed)
Subjective:    Patient ID: Antonio [name], male    DOB: 03-11-1945, 72 y.o.   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  Last seen 07/10/2016.  Here today with his wife.  Doing well currently on Lialda 4.8 g daily and Canasa 1 g daily.  He had a slight flare of his colitis with urgent looser stools containing blood in December and increased from 2.4g to 4.8 g daily.  With this symptoms have completely abated.  He reports he is feeling well now without loose stools, blood in his stool or abdominal pain.  He has a bowel movement about 1-2 times per day.  During flares he can have 3-4 bowel movements per day.  Appetite has been good but he noticed some upper abdominal pressure and bloating sensation with eating.  This also would often make it more difficult for him to breathe and require him to use a nebulizer treatment.  He started over-the-counter antacid, he believes this to be Prilosec 20 mg, and this symptom has resolved.  He denies heartburn, dysphagia and odynophagia.  No weight loss.  No nausea or vomiting.  He also continues to use Imodium 2 mg after his first bowel movement each day.  Review of Systems As per HPI, otherwise negative  Current Medications, Allergies, Past Medical History, Past Surgical History, Family History and Social History were reviewed in [employer] record.     Objective:   Physical Exam BP 138/68   Pulse 72   Ht 5' 10"  (1.778 m)   Wt 142 lb (64.4 kg)   BMI 20.37 kg/m  Constitutional: Well-developed and well-nourished. No distress. HEENT: Normocephalic and atraumatic. Oropharynx is clear and moist. Conjunctivae are normal.  No scleral icterus. Neck: Neck supple. Trachea midline. Cardiovascular: Normal rate, regular rhythm and intact distal pulses. No  M/R/G Pulmonary/chest: Effort normal and distant breath sounds normal. No wheezing, rales or rhonchi. Abdominal: Soft, nontender, nondistended. Bowel sounds active throughout. There are no masses palpable. No hepatosplenomegaly. Extremities: no clubbing, cyanosis, or edema Neurological: Alert and oriented to person place and time. Skin: Skin is warm and dry. Psychiatric: Normal mood and affect. Behavior is normal.  CBC    Component Value Date/Time   WBC 10.0 05/28/2016 1450   RBC 5.01 05/28/2016 1450   HGB 15.3 05/28/2016 1450   HGB 15.2 11/26/2015 0933   HCT 45.6 05/28/2016 1450   HCT 43.9 11/26/2015 0933   PLT 277.0 05/28/2016 1450   PLT 247 11/26/2015 0933   MCV 91.1 05/28/2016 1450   MCV 90 11/26/2015 0933   MCV 95 10/01/2013 2131   MCH 31.0 04/28/2016 1433   MCHC 33.6 05/28/2016 1450   RDW 13.1 05/28/2016 1450   RDW 13.6 11/26/2015 0933   RDW 12.4 10/01/2013 2131   LYMPHSABS 2.1 05/28/2016 1450   MONOABS 1.4 (H) 05/28/2016 1450   EOSABS 0.7 05/28/2016 1450   BASOSABS 0.1 05/28/2016 1450   CMP     Component Value Date/Time   NA 144 12/01/2016 0925   NA 138 10/01/2013 2131   K 5.0 12/01/2016 0925   K 4.2 10/01/2013 2131   CL 103 12/01/2016 0925   CL 107 10/01/2013 2131   CO2 24 12/01/2016 0925   CO2 23 10/01/2013 2131   GLUCOSE 85 12/01/2016 0925   GLUCOSE 72 05/28/2016 1450  GLUCOSE 118 (H) 10/01/2013 2131   BUN 11 12/01/2016 0925   BUN 11 10/01/2013 2131   CREATININE 1.03 12/01/2016 0925   CREATININE 0.93 10/01/2013 2131   CALCIUM 9.5 12/01/2016 0925   CALCIUM 8.7 10/01/2013 2131   PROT 7.3 04/28/2016 1433   PROT 7.3 11/26/2015 0933   ALBUMIN 4.4 12/01/2016 0925   AST 37 04/28/2016 1433   ALT 33 04/28/2016 1433   ALKPHOS 70 04/28/2016 1433   BILITOT 0.5 04/28/2016 1433   BILITOT 0.8 11/26/2015 0933   GFRNONAA 72 12/01/2016 0925   GFRNONAA >60 10/01/2013 2131   GFRAA 83 12/01/2016 0925   GFRAA >60 10/01/2013 2131    Last colonoscopy 08/09/2015 --3  polyps removed, internal hemorrhoids.  No evidence of active colitis.  Polyps were tubular adenomas.     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  Ulcerative colitis/proctitis --doing well on Lialda 4.8 g daily, daily Canasa 1 g.  We again discussed the natural history of IBD and he inquires if he will have to live with this for the rest of his life.  I inquired more to ensure that he truly is feeling as well as he states and he says that he is.  We discussed how this will be a lifelong condition short of having total proctocolectomy which he does not need at this time. --Continue Lialda 4.8 g daily, Canasa 1 g daily  2.  Upper abdominal bloating and fullness --improved with what sounds like low-dose PPI.  Given resolution of symptoms on PPI, I am not pursuing upper endoscopy at this time.  Patient or wife will contact me to let me know which medication this is and we can provide this in prescription form.  3.  History of polyps --surveillance colonoscopy recommended in June 2020  25 minutes spent with the patient today. Greater than 50% was spent in counseling and coordination of care with the patient  6 month ROV

## 2017-02-28 ENCOUNTER — Encounter: Payer: Self-pay | Admitting: Internal Medicine

## 2017-03-02 MED ORDER — OMEPRAZOLE MAGNESIUM 20 MG PO TBEC: 20.0000 mg | DELAYED_RELEASE_TABLET | Freq: Every day | ORAL | 6 refills | Status: DC

## 2017-03-09 NOTE — Telephone Encounter (Signed)
Dr Hilarie Fredrickson, patient requesting we send script for imodium to see if insurance will cover. Ok with you?

## 2017-03-10 ENCOUNTER — Ambulatory Visit (INDEPENDENT_AMBULATORY_CARE_PROVIDER_SITE_OTHER): Payer: PPO | Admitting: Family Medicine

## 2017-03-10 ENCOUNTER — Encounter: Payer: Self-pay | Admitting: Family Medicine

## 2017-03-10 VITALS — BP 120/72 | HR 68 | Ht 70.0 in | Wt 138.0 lb

## 2017-03-10 DIAGNOSIS — J0100 Acute maxillary sinusitis, unspecified: Secondary | ICD-10-CM | POA: Diagnosis not present

## 2017-03-10 DIAGNOSIS — J40 Bronchitis, not specified as acute or chronic: Secondary | ICD-10-CM | POA: Diagnosis not present

## 2017-03-10 DIAGNOSIS — J449 Chronic obstructive pulmonary disease, unspecified: Secondary | ICD-10-CM | POA: Diagnosis not present

## 2017-03-10 MED ORDER — GUAIFENESIN-CODEINE 100-10 MG/5ML PO SYRP: 5.0000 mL | ORAL_SOLUTION | Freq: Three times a day (TID) | ORAL | 0 refills | Status: DC | PRN

## 2017-03-10 MED ORDER — PREDNISONE 10 MG PO TABS
10.0000 mg | ORAL_TABLET | Freq: Every day | ORAL | 0 refills | Status: DC
Start: 2017-03-10 — End: 2017-06-07

## 2017-03-10 MED ORDER — AZITHROMYCIN 250 MG PO TABS: ORAL_TABLET | ORAL | 0 refills | Status: DC

## 2017-03-10 NOTE — Patient Instructions (Signed)

## 2017-03-10 NOTE — Progress Notes (Signed)
Name: Antonio [name]   MRN: [id number]    DOB: 03-May-1945   Date:03/10/2017       Progress Note  Subjective  Chief Complaint  Chief Complaint  Patient presents with  . Sinusitis    cough and cong since last Monday    Sinusitis  This is a new problem. The current episode started in the past 7 days (8 days). The problem is unchanged. There has been no fever. He is experiencing no pain. Associated symptoms include congestion, coughing and sinus pressure. Pertinent negatives include no chills, diaphoresis, ear pain, headaches, hoarse voice, neck pain, shortness of breath, sneezing, sore throat or swollen glands. Past treatments include acetaminophen and oral decongestants (mucinex). The treatment provided moderate relief.  Cough  This is a new problem. The current episode started in the past 7 days. The problem has been unchanged. The cough is productive of purulent sputum. Pertinent negatives include no chest pain, chills, ear congestion, ear pain, fever, headaches, heartburn, myalgias, nasal congestion, postnasal drip, rash, rhinorrhea, sore throat, shortness of breath, sweats, weight loss or wheezing. Nothing aggravates the symptoms. He has tried a beta-agonist inhaler and steroid inhaler for the symptoms. His past medical history is significant for COPD. There is no history of environmental allergies.    No problem-specific Assessment & Plan notes found for this encounter.   Past Medical History:  Diagnosis Date  . Allergy   . Calf pain   . COPD (chronic obstructive pulmonary disease) ([place])   . COPD (chronic obstructive pulmonary disease) ([place])   . Dyspnea   . Hypokalemia   . Internal hemorrhoids   . Muscle cramp   . Muscle pain   . Pneumonia   . PONV (postoperative nausea and vomiting)   . Tubular adenoma of colon   . Ulcerative colitis [facility])     Past Surgical History:  Procedure Laterality Date  . ANTERIOR CERVICAL DECOMP/DISCECTOMY FUSION N/A 05/05/2016   Procedure:  ANTERIOR CERVICAL DECOMPRESSION FUSION CERVICAL THREE-FOUR.;  Surgeon: Earnie Larsson, MD;  Location: [place];  Service: Neurosurgery;  Laterality: N/A;  right side approach  . CERVICAL DISCECTOMY  04/2016   C3-C4  . COLONOSCOPY    . [place]  . leg stent Left approx 5-6 yrs ago   2 to left leg  . MENISCUS REPAIR Left 12/2013  . ROTATOR CUFF REPAIR Left 2007  . TONSILLECTOMY AND ADENOIDECTOMY  1957    Family History  Problem Relation Age of Onset  . Cancer Mother        leukemia  . Heart disease Father   . Heart attack Father   . Healthy Daughter   . Healthy Son   . Colon cancer Neg Hx     Social History   Socioeconomic History  . Marital status: Married    Spouse name: Not on file  . Number of children: 2  . Years of education: Not on file  . Highest education level: Not on file  Social Needs  . Financial resource strain: Not on file  . Food insecurity - worry: Not on file  . Food insecurity - inability: Not on file  . Transportation needs - medical: Not on file  . Transportation needs - non-medical: Not on file  Occupational History  . Occupation: retired  Tobacco Use  . Smoking status: Former Smoker    Packs/day: 0.75    Years: 35.00    Pack years: 26.25    Types: Cigarettes, Cigars    Last  attempt to quit: 06/11/2011    Years since quitting: 5.7  . Smokeless tobacco: Never Used  . Tobacco comment: off and on smoker   Substance and Sexual Activity  . Alcohol use: Yes    Alcohol/week: 4.2 oz    Types: 7 Standard drinks or equivalent per week    Comment: 1-2 day   . Drug use: No  . Sexual activity: Yes  Other Topics Concern  . Not on file  Social History Narrative   Lives with wife in a 2 story home.  Has 2 children.     Retired from [employer] development [employer].     Education: college.     Allergies  Allergen Reactions  . Penicillins Rash    Has patient had a PCN reaction causing immediate rash, facial/tongue/throat swelling, SOB or lightheadedness  with hypotension:Yes Has patient had a PCN reaction causing severe rash involving mucus membranes or skin necrosis:Yes Has patient had a PCN reaction that required hospitalization:No Has patient had a PCN reaction occurring within the last 10 years:No If all of the above answers are "NO", then may proceed with Cephalosporin use.     Outpatient Medications Prior to Visit  Medication Sig Dispense Refill  . albuterol (PROVENTIL HFA;VENTOLIN HFA) 108 (90 Base) MCG/ACT inhaler Inhale 2 puffs into the lungs every 6 (six) hours as needed for wheezing or shortness of breath. 3 Inhaler 3  . albuterol (PROVENTIL) (2.5 MG/3ML) 0.083% nebulizer solution Take 3 mLs (2.5 mg total) by nebulization every 6 (six) hours as needed for wheezing or shortness of breath. 75 mL 12  . aspirin EC 81 MG tablet Take 81 mg by mouth daily.    . budesonide-formoterol (SYMBICORT) 160-4.5 MCG/ACT inhaler Inhale 2 puffs into the lungs daily. 3 Inhaler 3  . EPINEPHrine 0.3 mg/0.3 mL IJ SOAJ injection Inject 0.3 mLs (0.3 mg total) into the muscle once. (Patient taking differently: Inject 0.3 mg into the muscle daily as needed (for anaphylatic reaction.). ) 1 Device 1  . fluticasone (FLONASE) 50 MCG/ACT nasal spray Place 2 sprays into both nostrils daily. 16 g 6  . ipratropium-albuterol (DUONEB) 0.5-2.5 (3) MG/3ML SOLN INHALE 1 VIAL VIA NEBULIZER EVERY 6 HOURS AS NEEDED FOR SHORTNESS OF BREATH 360 mL 0  . loperamide (IMODIUM) 2 MG capsule Take 2 mg by mouth daily as needed for diarrhea or loose stools.     . mesalamine (CANASA) 1000 MG suppository Place 1 suppository (1,000 mg total) rectally at bedtime. 90 suppository 1  . mesalamine (LIALDA) 1.2 g EC tablet Take 4 tablets (4.8 g total) by mouth daily with breakfast. 360 tablet 1  . omeprazole (PRILOSEC OTC) 20 MG tablet Take 1 tablet (20 mg total) by mouth daily. 30 tablet 6  . Respiratory Therapy Supplies (FLUTTER) DEVI Use as directed 1 each 0   Facility-Administered  Medications Prior to Visit  Medication Dose Route Frequency Provider Last Rate Last Dose  . albuterol (PROVENTIL) (2.5 MG/3ML) 0.083% nebulizer solution 2.5 mg  2.5 mg Nebulization Once [name], MD        Review of Systems  Constitutional: Negative for chills, diaphoresis, fever, malaise/fatigue and weight loss.  HENT: Positive for congestion and sinus pressure. Negative for ear discharge, ear pain, hoarse voice, postnasal drip, rhinorrhea, sneezing and sore throat.   Eyes: Negative for blurred vision.  Respiratory: Positive for cough. Negative for sputum production, shortness of breath and wheezing.   Cardiovascular: Negative for chest pain, palpitations and leg swelling.  Gastrointestinal: Negative for  abdominal pain, blood in stool, constipation, diarrhea, heartburn, melena and nausea.  Genitourinary: Negative for dysuria, frequency, hematuria and urgency.  Musculoskeletal: Negative for back pain, joint pain, myalgias and neck pain.  Skin: Negative for rash.  Neurological: Negative for dizziness, tingling, sensory change, focal weakness and headaches.  Endo/Heme/Allergies: Negative for environmental allergies and polydipsia. Does not bruise/bleed easily.  Psychiatric/Behavioral: Negative for depression and suicidal ideas. The patient is not nervous/anxious and does not have insomnia.      Objective  Vitals:   03/10/17 1007  BP: 120/72  Pulse: 68  Weight: 138 lb (62.6 kg)  Height: 5' 10"  (1.778 m)    Physical Exam  Constitutional: He is oriented to person, place, and time and well-developed, well-nourished, and in no distress.  HENT:  Head: Normocephalic.  Right Ear: External ear normal.  Left Ear: External ear normal.  Nose: Nose normal.  Mouth/Throat: Oropharynx is clear and moist.  Eyes: Conjunctivae and EOM are normal. Pupils are equal, round, and reactive to light. Right eye exhibits no discharge. Left eye exhibits no discharge. No scleral icterus.  Neck: Normal  range of motion. Neck supple. No JVD present. No tracheal deviation present. No thyromegaly present.  Cardiovascular: Normal rate, regular rhythm, normal heart sounds and intact distal pulses. Exam reveals no gallop and no friction rub.  No murmur heard. Pulmonary/Chest: Breath sounds normal. No respiratory distress. He has no wheezes. He has no rales.  Abdominal: Soft. Bowel sounds are normal. He exhibits no mass. There is no hepatosplenomegaly. There is no tenderness. There is no rebound, no guarding and no CVA tenderness.  Musculoskeletal: Normal range of motion. He exhibits no edema or tenderness.  Lymphadenopathy:    He has no cervical adenopathy.  Neurological: He is alert and oriented to person, place, and time. He has normal sensation, normal strength and intact cranial nerves.  Skin: Skin is warm. No rash noted.  Psychiatric: Mood and affect normal.  Nursing note and vitals reviewed.     Assessment & Plan  Problem List Items Addressed This Visit      Respiratory   Centrilobular Emphysema - Primary   Relevant Medications   azithromycin (ZITHROMAX) 250 MG tablet   predniSONE (DELTASONE) 10 MG tablet   guaiFENesin-codeine (ROBITUSSIN AC) 100-10 MG/5ML syrup    Other Visit Diagnoses    Bronchitis       Relevant Medications   azithromycin (ZITHROMAX) 250 MG tablet   guaiFENesin-codeine (ROBITUSSIN AC) 100-10 MG/5ML syrup   Acute maxillary sinusitis, recurrence not specified       Relevant Medications   azithromycin (ZITHROMAX) 250 MG tablet   predniSONE (DELTASONE) 10 MG tablet   guaiFENesin-codeine (ROBITUSSIN AC) 100-10 MG/5ML syrup      Meds ordered this encounter  Medications  . azithromycin (ZITHROMAX) 250 MG tablet    Sig: 2 today then 1 a day for 4 days    Dispense:  6 tablet    Refill:  0  . predniSONE (DELTASONE) 10 MG tablet    Sig: Take 1 tablet (10 mg total) by mouth daily with breakfast.    Dispense:  30 tablet    Refill:  0  . guaiFENesin-codeine  (ROBITUSSIN AC) 100-10 MG/5ML syrup    Sig: Take 5 mLs by mouth 3 (three) times daily as needed for cough.    Dispense:  100 mL    Refill:  0      Dr. Otilio Miu [facility]  03/10/17

## 2017-03-17 ENCOUNTER — Other Ambulatory Visit: Payer: Self-pay

## 2017-04-01 ENCOUNTER — Telehealth: Payer: Self-pay

## 2017-04-01 DIAGNOSIS — H903 Sensorineural hearing loss, bilateral: Secondary | ICD-10-CM | POA: Diagnosis not present

## 2017-04-01 NOTE — Telephone Encounter (Signed)
Pt coming in 04/16/17

## 2017-04-01 NOTE — Telephone Encounter (Signed)
Try cell, they don't check home machine often

## 2017-04-01 NOTE — Telephone Encounter (Signed)
Called pt to sched AWV w/ [facility]. LVM requesting returned call.  

## 2017-04-16 ENCOUNTER — Ambulatory Visit (INDEPENDENT_AMBULATORY_CARE_PROVIDER_SITE_OTHER): Payer: PPO

## 2017-04-16 VITALS — BP 110/60 | HR 88 | Temp 98.0°F | Resp 12 | Ht 70.0 in | Wt 144.6 lb

## 2017-04-16 DIAGNOSIS — Z0000 Encounter for general adult medical examination without abnormal findings: Secondary | ICD-10-CM

## 2017-04-16 NOTE — Progress Notes (Signed)
Subjective:   Antonio [name] is a 72 y.o. male who presents for Medicare Annual/Subsequent preventive examination.  Review of Systems:  N/A Cardiac Risk Factors include: advanced age (>23mn, >>80women);male gender     Objective:    Vitals: BP 110/60 (BP Location: Right Arm, Patient Position: Sitting, Cuff Size: Normal)   Pulse 88   Temp 98 F (36.7 C) (Oral)   Resp 12   Ht 5' 10"  (1.778 m)   Wt 144 lb 9.6 oz (65.6 kg)   SpO2 95%   BMI 20.75 kg/m   Body mass index is 20.75 kg/m.  Advanced Directives 04/16/2017 04/28/2016 08/09/2015 08/02/2015  Does Patient Have a Medical Advance Directive? Yes Yes Yes Yes  Type of AParamedicof [place]oss[place]Living will [place]Living will - [place] will  Does patient want to make changes to medical advance directive? - No - Patient declined - No - Patient declined  Copy of [place] Chart? Yes Yes No - copy requested No - copy requested    Tobacco Social History   Tobacco Use  Smoking Status Former Smoker  . Packs/day: 0.75  . Years: 35.00  . Pack years: 26.25  . Types: Cigarettes, Cigars  . Last attempt to quit: 06/11/2011  . Years since quitting: 5.8  Smokeless Tobacco Never Used  Tobacco Comment   smoking cessation materials not required     Counseling given: No Comment: smoking cessation materials not required   Clinical Intake:  Pre-visit preparation completed: Yes  Pain : No/denies pain   BMI - recorded: 20.75 Nutritional Status: BMI of 19-24  Normal Nutritional Risks: None Diabetes: No  How often do you need to have someone help you when you read instructions, pamphlets, or other written materials from your doctor or pharmacy?: 1 - Never  Interpreter Needed?: No  Information entered by :: AEversole, LPN  Past Medical History:  Diagnosis Date  . Allergy   . Calf pain   . COPD (chronic obstructive pulmonary disease)  ([place]   . COPD [place] obstructive [place] disease) ([place]   . Dyspnea   . Hypokalemia   . Internal hemorrhoids   . Muscle cramp   . Muscle pain   . Pneumonia   . PONV (postoperative nausea and vomiting)   . Tubular adenoma of colon   . Ulcerative colitis ([facility]    Past Surgical History:  Procedure Laterality Date  . ANTERIOR CERVICAL DECOMP/DISCECTOMY FUSION N/A 05/05/2016   Procedure: ANTERIOR CERVICAL DECOMPRESSION FUSION CERVICAL THREE-FOUR.;  Surgeon: HEarnie Larsson MD;  [place]  Service: Neurosurgery;  Laterality: N/A;  right side approach  . CERVICAL DISCECTOMY  04/2016   C3-C4  . [place]    [place] Left approx 5-6 yrs ago   2 to left leg  . MENISCUS REPAIR Left 12/2013  . ROTATOR CUFF REPAIR Left 2007  . TONSILLECTOMY AND ADENOIDECTOMY  1957   Family History  Problem Relation Age of Onset  . Cancer Mother        leukemia  . Heart disease Father   . Heart attack Father   . Healthy Daughter   . Healthy Son   . Colon cancer Neg Hx    Social History   Socioeconomic History  . Marital status: Married    Spouse name: None  . Number of children: 2  . Years of education: None  . Highest education level:  Bachelor's degree (e.g., BA, AB, BS)  Social Needs  . Financial resource strain: Not hard at all  . Food insecurity - worry: Never true  . Food insecurity - inability: Never true  . Transportation needs - medical: No  . Transportation needs - non-medical: No  Occupational History  . Occupation: retired  Tobacco Use  . Smoking status: Former Smoker    Packs/day: 0.75    Years: 35.00    Pack years: 26.25    Types: Cigarettes, Cigars    Last attempt to quit: 06/11/2011    Years since quitting: 5.8  . Smokeless tobacco: Never Used  . Tobacco comment: smoking cessation materials not required  Substance and Sexual Activity  . Alcohol use: Yes    Alcohol/week: 4.2 oz    Types: 7 Standard drinks or equivalent per week     Comment: 1-2 day   . Drug use: No  . Sexual activity: Yes  Other Topics Concern  . None  Social History Narrative  . None    Outpatient Encounter Medications as of 04/16/2017  Medication Sig  . albuterol (PROVENTIL HFA;VENTOLIN HFA) 108 (90 Base) MCG/ACT inhaler Inhale 2 puffs into the lungs every 6 (six) hours as needed for wheezing or shortness of breath.  Marland Kitchen albuterol (PROVENTIL) (2.5 MG/3ML) 0.083% nebulizer solution Take 3 mLs (2.5 mg total) by nebulization every 6 (six) hours as needed for wheezing or shortness of breath.  Marland Kitchen aspirin EC 81 MG tablet Take 81 mg by mouth daily.  . budesonide-formoterol (SYMBICORT) 160-4.5 MCG/ACT inhaler Inhale 2 puffs into the lungs daily.  Marland Kitchen EPINEPHrine 0.3 mg/0.3 mL IJ SOAJ injection Inject 0.3 mLs (0.3 mg total) into the muscle once. (Patient taking differently: Inject 0.3 mg into the muscle daily as needed (for anaphylatic reaction.). )  . fluticasone (FLONASE) 50 MCG/ACT nasal spray Place 2 sprays into both nostrils daily.  Marland Kitchen ipratropium-albuterol (DUONEB) 0.5-2.5 (3) MG/3ML SOLN INHALE 1 VIAL VIA NEBULIZER EVERY 6 HOURS AS NEEDED FOR SHORTNESS OF BREATH  . loperamide (IMODIUM) 2 MG capsule Take 2 mg by mouth daily as needed for diarrhea or loose stools.   . mesalamine (CANASA) 1000 MG suppository Place 1 suppository (1,000 mg total) rectally at bedtime.  . mesalamine (LIALDA) 1.2 g EC tablet Take 4 tablets (4.8 g total) by mouth daily with breakfast.  . omeprazole (PRILOSEC OTC) 20 MG tablet Take 1 tablet (20 mg total) by mouth daily.  Marland Kitchen Respiratory Therapy Supplies (FLUTTER) DEVI Use as directed  . guaiFENesin-codeine (ROBITUSSIN AC) 100-10 MG/5ML syrup Take 5 mLs by mouth 3 (three) times daily as needed for cough. (Patient not taking: Reported on 04/16/2017)  . predniSONE (DELTASONE) 10 MG tablet Take 1 tablet (10 mg total) by mouth daily with breakfast. (Patient not taking: Reported on 04/16/2017)  . [DISCONTINUED] azithromycin (ZITHROMAX) 250 MG  tablet 2 today then 1 a day for 4 days (Patient not taking: Reported on 04/16/2017)   Facility-Administered Encounter Medications as of 04/16/2017  Medication  . albuterol (PROVENTIL) (2.5 MG/3ML) 0.083% nebulizer solution 2.5 mg    Activities of Daily Living In your present state of health, do you have any difficulty performing the following activities: 04/16/2017 04/28/2016  Hearing? N N  Comment denies hearing aids -  Vision? N N  Comment wears eyeglasses -  Difficulty concentrating or making decisions? N N  Walking or climbing stairs? N N  Dressing or bathing? N N  Doing errands, shopping? N N  Preparing Food and eating ? N -  Comment denies dentures -  Using the Toilet? N -  In the past six months, have you accidently leaked urine? N -  Do you have problems with loss of bowel control? N -  Managing your Medications? N -  Managing your Finances? N -  Housekeeping or managing your Housekeeping? N -  Some recent data might be hidden    Patient Care Team: [name], MD as PCP - General (Family Medicine) Pyrtle, Lajuan Lines, MD as Consulting Physician (Gastroenterology) Juanito Doom, MD as Consulting Physician (Pulmonary Disease)   Assessment:   This is a routine wellness examination for [place].  Exercise Activities and Dietary recommendations Current Exercise Habits: The patient does not participate in regular exercise at present, Exercise limited by: None identified  Goals    . DIET - INCREASE WATER INTAKE     Recommend to drink at least 6-8 8oz glasses of water per day.       Fall Risk Fall Risk  04/16/2017 06/17/2016 03/06/2016 04/30/2015 11/08/2014  Falls in the past year? No No No No No   Is the patient's home free of loose throw rugs in walkways, pet beds, electrical cords, etc?   Yes Does the patient have any grab bars in the bathroom? No  Does the patient use a shower chair when bathing? No Does the patient have any stairs in or around the home? Yes If so, are there  any handrails? Yes Does the patient have adequate lighting?  Yes Does the patient use a cane, walker or w/c? No Does the patient use of an elevated toilet seat? No  Timed Get Up and Go Performed: Yes. Pt ambulated 10 feet within 5 sec. Gait stead-fast and without the use of an assistive device. No intervention required at this time. Fall risk prevention has been discussed.  Pt declined my offer to send Community Resource Referral to Care Guide for  installation of grab bars in the shower, shower chair or an elevated toilet seat.  Depression Screen PHQ 2/9 Scores 04/16/2017 04/16/2017 06/17/2016 04/30/2015  PHQ - 2 Score 0 0 0 0  PHQ- 9 Score 0 - - -    Cognitive Function     6CIT Screen 04/16/2017  What Year? 0 points  What month? 0 points  What time? 0 points  Count back from 20 0 points  Months in reverse 0 points  Repeat phrase 0 points  Total Score 0    Immunization History  Administered Date(s) Administered  . Influenza Split 11/24/2013  . Influenza, High Dose Seasonal PF 11/05/2016  . Influenza,inj,Quad PF,6+ Mos 11/08/2014, 11/26/2015  . Influenza-Unspecified 10/11/2013, 10/11/2013  . Pneumococcal Conjugate-13 04/18/2014  . Pneumococcal Polysaccharide-23 11/26/2015  . Tdap 02/13/2011  . Zoster 11/07/2010  . Zoster Recombinat (Shingrix) 12/01/2016, 02/20/2017    Qualifies for Shingles Vaccine? No. Up to date  Screening Tests Health Maintenance  Topic Date Due  . COLONOSCOPY  08/09/2018  . TETANUS/TDAP  02/12/2021  . INFLUENZA VACCINE  Completed  . Hepatitis C Screening  Completed  . PNA vac Low Risk Adult  Completed   Cancer Screenings: Lung: Low Dose CT Chest recommended if Age 23-80 years, 30 pack-year currently smoking OR have quit w/in 15years. Patient does not qualify. Colorectal: Completed colonscopy 08/09/15. Repeat every 3 years  Additional Screenings: Hepatitis B/HIV/Syphillis: Does not qualify Hepatitis C Screening: Completed 11/26/15    Plan:  I  have personally reviewed and addressed the Medicare Annual Wellness questionnaire and have noted the following  in the patient's chart:  A. Medical and social history B. Use of alcohol, tobacco or illicit drugs  C. Current medications and supplements D. Functional ability and status E.  Nutritional status F.  Physical activity G. Advance directives H. List of other physicians I.  Hospitalizations, surgeries, and ER visits in previous 12 months J.  [place] such as hearing and vision if needed, cognitive and depression L. Referrals and appointments - none  In addition, I have reviewed and discussed with patient certain preventive protocols, quality metrics, and best practice recommendations. A written personalized care plan for preventive services as well as general preventive health recommendations were provided to patient.  Signed,  Aleatha Borer, LPN Nurse Health Advisor  MD Recommendations: None

## 2017-04-16 NOTE — Patient Instructions (Signed)
Antonio [name] , Thank you for taking time to come for your Medicare Wellness Visit. I appreciate your ongoing commitment to your health goals. Please review the following plan we discussed and let me know if I can assist you in the future.   Screening recommendations/referrals: Colorectal Screening: Completed 08/09/15. Repeat every 3 years  Vision/Dental Exams: Recommended yearly ophthalmology/optometry visit for glaucoma screening and checkup Recommended yearly dental visit for hygiene and checkup  Vaccinations: Influenza vaccine: Up to date Pneumococcal vaccine: Completed series Tdap vaccine: Up to date Shingles vaccine: Up to date    Advanced directives: We have received a copy of your POA (Power of Vill[place]) and/or Living Will. These documents can be located in your chart.  Conditions/risks identified: Recommend to drink at least 6-8 8oz glasses of water per day.  Next appointment: Please schedule your Annual Wellness Visit with your Nurse Health Advisor in one year.  Preventive Care 8 Years and Older, Male Preventive care refers to lifestyle choices and visits with your health care provider that can promote health and wellness. What does preventive care include?  A yearly physical exam. This is also called an annual well check.  Dental exams once or twice a year.  Routine eye exams. Ask your health care provider how often you should have your eyes checked.  Personal lifestyle choices, including:  Daily care of your teeth and gums.  Regular physical activity.  Eating a healthy diet.  Avoiding tobacco and drug use.  Limiting alcohol use.  Practicing safe sex.  Taking low doses of aspirin every day.  Taking vitamin and mineral supplements as recommended by your health care provider. What happens during an annual well check? The services and screenings done by your health care provider during your annual well check will depend on your age, overall health, lifestyle  risk factors, and family history of disease. Counseling  Your health care provider may ask you questions about your:  Alcohol use.  Tobacco use.  Drug use.  Emotional well-being.  Home and relationship well-being.  Sexual activity.  Eating habits.  History of falls.  Memory and ability to understand (cognition).  Work and work Statistician. Screening  You may have the following tests or measurements:  Height, weight, and BMI.  Blood pressure.  Lipid and cholesterol levels. These may be checked every 5 years, or more frequently if you are over 36 years old.  Skin check.  Lung cancer screening. You may have this screening every year starting at age 68 if you have a 30-pack-year history of smoking and currently smoke or have quit within the past 15 years.  Fecal occult blood test (FOBT) of the stool. You may have this test every year starting at age 30.  Flexible sigmoidoscopy or colonoscopy. You may have a sigmoidoscopy every 5 years or a colonoscopy every 10 years starting at age 42.  Prostate cancer screening. Recommendations will vary depending on your family history and other risks.  Hepatitis C blood test.  Hepatitis B blood test.  Sexually transmitted disease (STD) testing.  Diabetes screening. This is done by checking your blood sugar (glucose) after you have not eaten for a while (fasting). You may have this done every 1-3 years.  Abdominal aortic aneurysm (AAA) screening. You may need this if you are a current or former smoker.  Osteoporosis. You may be screened starting at age 41 if you are at high risk. Talk with your health care provider about your test results, treatment options, and if necessary,  the need for more tests. Vaccines  Your health care provider may recommend certain vaccines, such as:  Influenza vaccine. This is recommended every year.  Tetanus, diphtheria, and acellular pertussis (Tdap, Td) vaccine. You may need a Td booster every 10  years.  Zoster vaccine. You may need this after age 42.  Pneumococcal 13-valent conjugate (PCV13) vaccine. One dose is recommended after age 1.  Pneumococcal polysaccharide (PPSV23) vaccine. One dose is recommended after age 19. Talk to your health care provider about which screenings and vaccines you need and how often you need them. This information is not intended to replace advice given to you by your health care provider. Make sure you discuss any questions you have with your health care provider. Document Released: 02/23/2015 Document Revised: 10/17/2015 Document Reviewed: 11/28/2014 Elsevier Interactive Patient Education  2017 [place] Prevention in the Home Falls can cause injuries. They can happen to people of all ages. There are many things you can do to make your home safe and to help prevent falls. What can I do on the outside of my home?  Regularly fix the edges of walkways and driveways and fix any cracks.  Remove anything that might make you trip as you walk through a door, such as a raised step or threshold.  Trim any bushes or trees on the path to your home.  Use bright outdoor lighting.  Clear any walking paths of anything that might make someone trip, such as rocks or tools.  Regularly check to see if handrails are loose or broken. Make sure that both sides of any steps have handrails.  Any raised decks and porches should have guardrails on the edges.  Have any leaves, snow, or ice cleared regularly.  Use sand or salt on walking paths during winter.  Clean up any spills in your garage right away. This includes oil or grease spills. What can I do in the bathroom?  Use night lights.  Install grab bars by the toilet and in the tub and shower. Do not use towel bars as grab bars.  Use non-skid mats or decals in the tub or shower.  If you need to sit down in the shower, use a plastic, non-slip stool.  Keep the floor dry. Clean up any water that  spills on the floor as soon as it happens.  Remove soap buildup in the tub or shower regularly.  Attach bath mats securely with double-sided non-slip rug tape.  Do not have throw rugs and other things on the floor that can make you trip. What can I do in the bedroom?  Use night lights.  Make sure that you have a light by your bed that is easy to reach.  Do not use any sheets or blankets that are too big for your bed. They should not hang down onto the floor.  Have a firm chair that has side arms. You can use this for support while you get dressed.  Do not have throw rugs and other things on the floor that can make you trip. What can I do in the kitchen?  Clean up any spills right away.  Avoid walking on wet floors.  Keep items that you use a lot in easy-to-reach places.  If you need to reach something above you, use a strong step stool that has a grab bar.  Keep electrical cords out of the way.  Do not use floor polish or wax that makes floors slippery. If you must use  wax, use non-skid floor wax.  Do not have throw rugs and other things on the floor that can make you trip. What can I do with my stairs?  Do not leave any items on the stairs.  Make sure that there are handrails on both sides of the stairs and use them. Fix handrails that are broken or loose. Make sure that handrails are as long as the stairways.  Check any carpeting to make sure that it is firmly attached to the stairs. Fix any carpet that is loose or worn.  Avoid having throw rugs at the top or bottom of the stairs. If you do have throw rugs, attach them to the floor with carpet tape.  Make sure that you have a light switch at the top of the stairs and the bottom of the stairs. If you do not have them, ask someone to add them for you. What else can I do to help prevent falls?  Wear shoes that:  Do not have high heels.  Have rubber bottoms.  Are comfortable and fit you well.  Are closed at the  toe. Do not wear sandals.  If you use a stepladder:  Make sure that it is fully opened. Do not climb a closed stepladder.  Make sure that both sides of the stepladder are locked into place.  Ask someone to hold it for you, if possible.  Clearly mark and make sure that you can see:  Any grab bars or handrails.  First and last steps.  Where the edge of each step is.  Use tools that help you move around (mobility aids) if they are needed. These include:  Canes.  Walkers.  Scooters.  Crutches.  Turn on the lights when you go into a dark area. Replace any light bulbs as soon as they burn out.  Set up your furniture so you have a clear path. Avoid moving your furniture around.  If any of your floors are uneven, fix them.  If there are any pets around you, be aware of where they are.  Review your medicines with your doctor. Some medicines can make you feel dizzy. This can increase your chance of falling. Ask your doctor what other things that you can do to help prevent falls. This information is not intended to replace advice given to you by your health care provider. Make sure you discuss any questions you have with your health care provider. Document Released: 11/23/2008 Document Revised: 07/05/2015 Document Reviewed: 03/03/2014 Elsevier Interactive Patient Education  2017 [employer].

## 2017-04-17 DIAGNOSIS — H903 Sensorineural hearing loss, bilateral: Secondary | ICD-10-CM | POA: Diagnosis not present

## 2017-05-05 ENCOUNTER — Encounter (INDEPENDENT_AMBULATORY_CARE_PROVIDER_SITE_OTHER): Payer: Self-pay | Admitting: Vascular Surgery

## 2017-05-05 ENCOUNTER — Ambulatory Visit (INDEPENDENT_AMBULATORY_CARE_PROVIDER_SITE_OTHER): Payer: PPO | Admitting: Vascular Surgery

## 2017-05-05 ENCOUNTER — Ambulatory Visit (INDEPENDENT_AMBULATORY_CARE_PROVIDER_SITE_OTHER): Payer: PPO

## 2017-05-05 VITALS — BP 121/69 | HR 67 | Resp 13 | Ht 71.0 in | Wt 145.0 lb

## 2017-05-05 DIAGNOSIS — M4802 Spinal stenosis, cervical region: Secondary | ICD-10-CM

## 2017-05-05 DIAGNOSIS — J449 Chronic obstructive pulmonary disease, unspecified: Secondary | ICD-10-CM | POA: Diagnosis not present

## 2017-05-05 DIAGNOSIS — I70212 Atherosclerosis of native arteries of extremities with intermittent claudication, left leg: Secondary | ICD-10-CM

## 2017-05-05 DIAGNOSIS — I6523 Occlusion and stenosis of bilateral carotid arteries: Secondary | ICD-10-CM

## 2017-05-05 NOTE — Assessment & Plan Note (Signed)
His noninvasive studies show stable, normal ABIs on the right of 1.1 and a stable ABI on the left of 0.45. Stable and unchanged.  Continue current medical regimen including aspirin.  Recheck in 1 year.

## 2017-05-05 NOTE — Assessment & Plan Note (Signed)
Better after surgery 

## 2017-05-05 NOTE — Progress Notes (Signed)
MRN : [id number]  Antonio [name] is a 72 y.o. (1946-01-01) male who presents with chief complaint of  Chief Complaint  Patient presents with  . Follow-up    1 year ABI and Carotid  .  History of Present Illness: Patient returns today in follow up of multiple vascular issues.  Patient is doing reasonably well.  He does still have some tingling in his fingers and pain in his hands.  He had neck surgery almost a year ago.  Otherwise he has done well.  He denies lifestyle limiting claudication, ischemic rest pain, or ulceration.  His noninvasive studies show stable, normal ABIs on the right of 1.1 and a stable ABI on the left of 0.45. His carotid disease remains stable at 1-39% bilaterally on duplex.  No TIA or stroke symptoms.  Current Outpatient Medications  Medication Sig Dispense Refill  . albuterol (PROVENTIL HFA;VENTOLIN HFA) 108 (90 Base) MCG/ACT inhaler Inhale 2 puffs into the lungs every 6 (six) hours as needed for wheezing or shortness of breath. 3 Inhaler 3  . albuterol (PROVENTIL) (2.5 MG/3ML) 0.083% nebulizer solution Take 3 mLs (2.5 mg total) by nebulization every 6 (six) hours as needed for wheezing or shortness of breath. 75 mL 12  . aspirin EC 81 MG tablet Take 81 mg by mouth daily.    . budesonide-formoterol (SYMBICORT) 160-4.5 MCG/ACT inhaler Inhale 2 puffs into the lungs daily. 3 Inhaler 3  . EPINEPHrine 0.3 mg/0.3 mL IJ SOAJ injection Inject 0.3 mLs (0.3 mg total) into the muscle once. (Patient taking differently: Inject 0.3 mg into the muscle daily as needed (for anaphylatic reaction.). ) 1 Device 1  . fluticasone (FLONASE) 50 MCG/ACT nasal spray Place 2 sprays into both nostrils daily. 16 g 6  . ipratropium-albuterol (DUONEB) 0.5-2.5 (3) MG/3ML SOLN INHALE 1 VIAL VIA NEBULIZER EVERY 6 HOURS AS NEEDED FOR SHORTNESS OF BREATH 360 mL 0  . loperamide (IMODIUM) 2 MG capsule Take 2 mg by mouth daily as needed for diarrhea or loose stools.     . mesalamine (CANASA) 1000  MG suppository Place 1 suppository (1,000 mg total) rectally at bedtime. 90 suppository 1  . mesalamine (LIALDA) 1.2 g EC tablet Take 4 tablets (4.8 g total) by mouth daily with breakfast. 360 tablet 1  . omeprazole (PRILOSEC OTC) 20 MG tablet Take 1 tablet (20 mg total) by mouth daily. 30 tablet 6  . omeprazole (PRILOSEC) 20 MG capsule     . Respiratory Therapy Supplies (FLUTTER) DEVI Use as directed 1 each 0  . guaiFENesin-codeine (ROBITUSSIN AC) 100-10 MG/5ML syrup Take 5 mLs by mouth 3 (three) times daily as needed for cough. (Patient not taking: Reported on 04/16/2017) 100 mL 0  . predniSONE (DELTASONE) 10 MG tablet Take 1 tablet (10 mg total) by mouth daily with breakfast. (Patient not taking: Reported on 04/16/2017) 30 tablet 0   Current Facility-Administered Medications  Medication Dose Route Frequency Provider Last Rate Last Dose  . albuterol (PROVENTIL) (2.5 MG/3ML) 0.083% nebulizer solution 2.5 mg  2.5 mg Nebulization Once [name], MD        Past Medical History:  Diagnosis Date  . Allergy   . Calf pain   . COPD (chronic obstructive pulmonary disease) ([place])   . COPD (chronic obstructive pulmonary disease) ([place])   . Dyspnea   . Hypokalemia   . Internal hemorrhoids   . Muscle cramp   . Muscle pain   . Pneumonia   . PONV (postoperative nausea and  vomiting)   . Tubular adenoma of colon   . Ulcerative colitis [facility])     Past Surgical History:  Procedure Laterality Date  . ANTERIOR CERVICAL DECOMP/DISCECTOMY FUSION N/A 05/05/2016   Procedure: ANTERIOR CERVICAL DECOMPRESSION FUSION CERVICAL THREE-FOUR.;  Surgeon: Earnie Larsson, MD;  Location: [facility];  Service: Neurosurgery;  Laterality: N/A;  right side approach  . CERVICAL DISCECTOMY  04/2016   C3-C4  . COLONOSCOPY    . Aquadale  . leg stent Left approx 5-6 yrs ago   2 to left leg  . MENISCUS REPAIR Left 12/2013  . ROTATOR CUFF REPAIR Left 2007  . TONSILLECTOMY AND ADENOIDECTOMY  1957    Social  History        Social History  Substance Use Topics  . Smoking status: Former Smoker    Packs/day: 0.75    Years: 35.00    Types: Cigarettes, Cigars    Quit date: 06/11/2011  . Smokeless tobacco: Never Used     Comment: off and on smoker   . Alcohol use 4.2 oz/week     7 Standard drinks or equivalent per week      Comment: 1-2 day     Family History       Family History  Problem Relation Age of Onset  . Cancer Mother     leukemia  . Heart disease Father   . Heart attack Father   . Healthy Daughter   . Healthy Son   . Colon cancer Neg Hx          Allergies  Allergen Reactions  . Penicillins Rash    Has patient had a PCN reaction causing immediate rash, facial/tongue/throat swelling, SOB or lightheadedness with hypotension:Yes Has patient had a PCN reaction causing severe rash involving mucus membranes or skin necrosis:Yes Has patient had a PCN reaction that required hospitalization:No Has patient had a PCN reaction occurring within the last 10 years:No If all of the above answers are "NO", then may proceed with Cephalosporin use.      REVIEW OF SYSTEMS (Negative unless checked)  Constitutional: [] Weight loss  [] Fever  [] Chills Cardiac: [] Chest pain   [] Chest pressure   [] Palpitations   [] Shortness of breath when laying flat   [] Shortness of breath at rest   [] Shortness of breath with exertion. Vascular:  [x] Pain in legs with walking   [] Pain in legs at rest   [] Pain in legs when laying flat   [x] Claudication   [] Pain in feet when walking  [] Pain in feet at rest  [] Pain in feet when laying flat   [] History of DVT   [] Phlebitis   [] Swelling in legs   [] Varicose veins   [] Non-healing ulcers Pulmonary:   [] Uses home oxygen   [] Productive cough   [] Hemoptysis   [] Wheeze  [] COPD   [] Asthma Neurologic:  [] Dizziness  [] Blackouts   [] Seizures   [] History of stroke   [] History of TIA  [] Aphasia   [] Temporary blindness   [] Dysphagia   [x] Weakness  or numbness in arms   [] Weakness or numbness in legs Musculoskeletal:  [] Arthritis   [] Joint swelling   [] Joint pain   [] Low back pain Hematologic:  [] Easy bruising  [] Easy bleeding   [] Hypercoagulable state   [] Anemic   Gastrointestinal:  [] Blood in stool   [] Vomiting blood  [] Gastroesophageal reflux/heartburn   [] Abdominal pain Genitourinary:  [] Chronic kidney disease   [] Difficult urination  [] Frequent urination  [] Burning with urination   [] Hematuria Skin:  [] Rashes   [] Ulcers   []   Wounds Psychological:  [] History of anxiety   []  History of major depression.       Physical Examination  BP 121/69 (BP Location: Right Arm, Patient Position: Sitting)   Pulse 67   Resp 13   Ht 5' 11"  (1.803 m)   Wt 145 lb (65.8 kg)   BMI 20.22 kg/m  Gen:  WD/WN, NAD.  Appears younger than stated age Head: [place]/AT, No temporalis wasting. Ear/Nose/Throat: Hearing grossly intact, nares w/o erythema or drainage, trachea midline Eyes: Conjunctiva clear. Sclera non-icteric Neck: Supple.  No JVD.  Pulmonary:  Good air movement, no use of accessory muscles.  Cardiac: RRR Vascular:  Vessel Right Left  Radial Palpable Palpable                          PT Palpable  not palpable  DP Palpable  1+ palpable    Musculoskeletal: M/S 5/5 throughout.  No deformity or atrophy.  Neurologic: Sensation grossly intact in extremities.  Symmetrical.  Speech is fluent.  Psychiatric: Judgment intact, Mood & affect appropriate for pt's clinical situation. Dermatologic: No rashes or ulcers noted.  No cellulitis or open wounds.       Labs No results found for this or any previous visit (from the past 2160 hour(s)).  Radiology No results found.  Assessment/Plan Centrilobular Emphysema Follows with a pulmonologist. Reasonably stable  Carotid stenosis Asymptomatic. Duplex shows stable 1-39% carotid artery stenosis bilaterally. Continue aspirin therapy. Recheck in 1 year.   Cervical stenosis of spinal  canal Better after surgery  Atherosclerosis of native arteries of extremity with intermittent claudication (HCC) His noninvasive studies show stable, normal ABIs on the right of 1.1 and a stable ABI on the left of 0.45. Stable and unchanged.  Continue current medical regimen including aspirin.  Recheck in 1 year.    Leotis Pain, MD  05/05/2017 4:57 PM    This note was created with Dragon medical transcription system.  Any errors from dictation are purely unintentional

## 2017-05-11 ENCOUNTER — Encounter: Payer: Self-pay | Admitting: Pulmonary Disease

## 2017-05-13 MED ORDER — PREDNISONE 20 MG PO TABS: 20.0000 mg | ORAL_TABLET | Freq: Every day | ORAL | 0 refills | Status: DC

## 2017-06-07 ENCOUNTER — Other Ambulatory Visit: Payer: Self-pay

## 2017-06-07 ENCOUNTER — Ambulatory Visit
Admission: EM | Admit: 2017-06-07 | Discharge: 2017-06-07 | Disposition: A | Discharge status: Home / Self Care | Payer: PPO | Attending: Family Medicine | Admitting: Family Medicine

## 2017-06-07 DIAGNOSIS — J441 Chronic obstructive pulmonary disease with (acute) exacerbation: Secondary | ICD-10-CM

## 2017-06-07 DIAGNOSIS — R05 Cough: Secondary | ICD-10-CM

## 2017-06-07 MED ORDER — GUAIFENESIN-CODEINE 100-10 MG/5ML PO SOLN: 5.0000 mL | Freq: Three times a day (TID) | ORAL | 0 refills | Status: DC | PRN

## 2017-06-07 MED ORDER — DOXYCYCLINE HYCLATE 100 MG PO CAPS: 100.0000 mg | ORAL_CAPSULE | Freq: Two times a day (BID) | ORAL | 0 refills | Status: DC

## 2017-06-07 MED ORDER — PREDNISONE 50 MG PO TABS: ORAL_TABLET | ORAL | 0 refills | Status: DC

## 2017-06-07 NOTE — ED Provider Notes (Signed)
[facility]    CSN: [id number] Arrival date & time: 06/07/17  2035  History   Chief Complaint Chief Complaint  Patient presents with  . Cough   HPI  72 year old male with COPD presents with cough.  Patient reports that he has had a 2-week history of cough.  Is keeping him awake at night.  Severe.  Additionally, he is having a lot of rhinorrhea and nasal discharge in the morning.  He reports wheezing and shortness of breath.  No known exacerbating or relieving factors.  No other associated symptoms. No other complaints.  Past Medical History:  Diagnosis Date  . Allergy   . Calf pain   . COPD (chronic obstructive pulmonary disease) ([place])   . COPD (chronic obstructive pulmonary disease) ([place])   . Dyspnea   . Hypokalemia   . Internal hemorrhoids   . Muscle cramp   . Muscle pain   . Pneumonia   . PONV (postoperative nausea and vomiting)   . Tubular adenoma of colon   . Ulcerative colitis [facility])     Patient Active Problem List   Diagnosis Date Noted  . Ulcerative colitis with complication ([place]) 59/74/1638  . Cervical stenosis of spinal canal 05/05/2016  . Carotid stenosis 05/02/2016  . Atherosclerosis of native arteries of extremity with intermittent claudication ([place]) 05/02/2016  . Diarrhea 06/09/2014  . Centrilobular Emphysema 04/25/2014  . Tobacco abuse 04/25/2014    Past Surgical History:  Procedure Laterality Date  . ANTERIOR CERVICAL DECOMP/DISCECTOMY FUSION N/A 05/05/2016   Procedure: ANTERIOR CERVICAL DECOMPRESSION FUSION CERVICAL THREE-FOUR.;  Surgeon: Earnie Larsson, MD;  Location: [place];  Service: Neurosurgery;  Laterality: N/A;  right side approach  . CERVICAL DISCECTOMY  04/2016   C3-C4  . COLONOSCOPY    . [place]  . leg stent Left approx 5-6 yrs ago   2 to left leg  . MENISCUS REPAIR Left 12/2013  . ROTATOR CUFF REPAIR Left 2007  . TONSILLECTOMY AND ADENOIDECTOMY  1957   Home Medications    Prior to Admission medications     Medication Sig Start Date End Date Taking? Authorizing Provider  albuterol (PROVENTIL HFA;VENTOLIN HFA) 108 (90 Base) MCG/ACT inhaler Inhale 2 puffs into the lungs every 6 (six) hours as needed for wheezing or shortness of breath. 11/26/16   Juanito Doom, MD  albuterol (PROVENTIL) (2.5 MG/3ML) 0.083% nebulizer solution Take 3 mLs (2.5 mg total) by nebulization every 6 (six) hours as needed for wheezing or shortness of breath. 09/29/16   [name], MD  aspirin EC 81 MG tablet Take 81 mg by mouth daily.    [provider]  budesonide-formoterol (SYMBICORT) 160-4.5 MCG/ACT inhaler Inhale 2 puffs into the lungs daily. 11/26/16   Juanito Doom, MD  doxycycline (VIBRAMYCIN) 100 MG capsule Take 1 capsule (100 mg total) by mouth 2 (two) times daily. 06/07/17   Coral Spikes, DO  EPINEPHrine 0.3 mg/0.3 mL IJ SOAJ injection Inject 0.3 mLs (0.3 mg total) into the muscle once. Patient taking differently: Inject 0.3 mg into the muscle daily as needed (for anaphylatic reaction.).  04/30/15   [name], MD  fluticasone (FLONASE) 50 MCG/ACT nasal spray Place 2 sprays into both nostrils daily. 06/17/16   [name], MD  guaiFENesin-codeine 100-10 MG/5ML syrup Take 5 mLs by mouth 3 (three) times daily as needed for cough. 06/07/17   Coral Spikes, DO  ipratropium-albuterol (DUONEB) 0.5-2.5 (3) MG/3ML SOLN INHALE 1 VIAL VIA NEBULIZER EVERY  6 HOURS AS NEEDED FOR SHORTNESS OF BREATH 04/10/16   [name], MD  loperamide (IMODIUM) 2 MG capsule Take 2 mg by mouth daily as needed for diarrhea or loose stools.     [provider]  mesalamine (LIALDA) 1.2 g EC tablet Take 4 tablets (4.8 g total) by mouth daily with breakfast. 02/27/17   Pyrtle, Lajuan Lines, MD  omeprazole (PRILOSEC) 20 MG capsule  05/04/17   [provider]  predniSONE (DELTASONE) 50 MG tablet 1 tablet daily x 5 days 06/07/17   Coral Spikes, DO  Respiratory Therapy Supplies (FLUTTER) DEVI Use as directed 12/24/15    Juanito Doom, MD    Family History Family History  Problem Relation Age of Onset  . Cancer Mother        leukemia  . Heart disease Father   . Heart attack Father   . Healthy Daughter   . Healthy Son   . Colon cancer Neg Hx     Social History Social History   Tobacco Use  . Smoking status: Former Smoker    Packs/day: 0.75    Years: 35.00    Pack years: 26.25    Types: Cigarettes, Cigars    Last attempt to quit: 06/11/2011    Years since quitting: 5.9  . Smokeless tobacco: Never Used  . Tobacco comment: smoking cessation materials not required  Substance Use Topics  . Alcohol use: Yes    Alcohol/week: 4.2 oz    Types: 7 Standard drinks or equivalent per week    Comment: 1-2 day   . Drug use: No     Allergies   Penicillins   Review of Systems Review of Systems  Constitutional: Negative for fever.  HENT: Positive for rhinorrhea.   Respiratory: Positive for cough, shortness of breath and wheezing.    Physical Exam Triage Vital Signs ED Triage Vitals  Enc Vitals Group     BP 06/07/17 0843 133/63     Pulse Rate 06/07/17 0843 60     Resp 06/07/17 0843 20     Temp 06/07/17 0843 97.7 F (36.5 C)     Temp Source 06/07/17 0843 Oral     SpO2 06/07/17 0843 95 %     Weight 06/07/17 0842 146 lb (66.2 kg)     Height 06/07/17 0842 5' 11"  (1.803 m)     Head Circumference --      Peak Flow --      Pain Score 06/07/17 0842 0     Pain Loc --      Pain Edu? --      Excl. in [place]? --    Updated Vital Signs BP 133/63 (BP Location: Left Arm)   Pulse 60   Temp 97.7 F (36.5 C) (Oral)   Resp 20   Ht 5' 11"  (1.803 m)   Wt 146 lb (66.2 kg)   SpO2 95%   BMI 20.36 kg/m   Physical Exam  Constitutional: He is oriented to person, place, and time. He appears well-developed. No distress.  HENT:  Head: Normocephalic and atraumatic.  Mouth/Throat: Oropharynx is clear and moist.  Eyes: Conjunctivae are normal. Right eye exhibits no discharge. Left eye exhibits no  discharge.  Cardiovascular: Normal rate and regular rhythm.  Pulmonary/Chest: Effort normal.  Mild diffuse wheezing.  Neurological: He is oriented to person, place, and time.  Psychiatric: He has a normal mood and affect. His behavior is normal.  Nursing note and vitals reviewed.  UC Treatments /  Results  Labs (all labs ordered are listed, but only abnormal results are displayed) Labs Reviewed - No data to display  EKG None Radiology No results found.  Procedures Procedures (including critical care time)  Medications Ordered in UC Medications - No data to display   Initial Impression / Assessment and Plan / UC Course  I have reviewed the triage vital signs and the nursing notes.  Pertinent labs & imaging results that were available during my care of the patient were reviewed by me and considered in my medical decision making (see chart for details).    72 year old male presents with COPD exacerbation.  Treating with prednisone, doxycycline.  Guaifenesin with codeine for cough.  Final Clinical Impressions(s) / UC Diagnoses   Final diagnoses:  COPD exacerbation [facility])    ED Discharge Orders        Ordered    predniSONE (DELTASONE) 50 MG tablet     06/07/17 0903    guaiFENesin-codeine 100-10 MG/5ML syrup  3 times daily PRN     06/07/17 0903    doxycycline (VIBRAMYCIN) 100 MG capsule  2 times daily     06/07/17 0903     Controlled Substance Prescriptions [place] Controlled Substance Registry consulted? Not Applicable   Coral Spikes, [place] 06/07/17 6168

## 2017-06-07 NOTE — Discharge Instructions (Signed)
Medications as prescribed. ° °Take care ° °Dr. Kimm Ungaro  °

## 2017-06-07 NOTE — ED Triage Notes (Signed)
Pt reports for past 2 weeks pt has been "up all night coughing" and then in the morning he blows clear mucous from his nose in copious amounts.

## 2017-06-10 ENCOUNTER — Ambulatory Visit: Payer: Self-pay | Admitting: Adult Health

## 2017-06-22 ENCOUNTER — Ambulatory Visit (INDEPENDENT_AMBULATORY_CARE_PROVIDER_SITE_OTHER)
Admission: RE | Admit: 2017-06-22 | Discharge: 2017-06-22 | Disposition: A | Discharge status: Home / Self Care | Payer: PPO | Source: Ambulatory Visit | Attending: Adult Health | Admitting: Adult Health

## 2017-06-22 ENCOUNTER — Encounter: Payer: Self-pay | Admitting: Adult Health

## 2017-06-22 ENCOUNTER — Ambulatory Visit: Payer: PPO | Admitting: Adult Health

## 2017-06-22 VITALS — BP 104/60 | HR 60 | Temp 98.2°F | Ht 71.0 in | Wt 140.2 lb

## 2017-06-22 DIAGNOSIS — J441 Chronic obstructive pulmonary disease with (acute) exacerbation: Secondary | ICD-10-CM | POA: Insufficient documentation

## 2017-06-22 DIAGNOSIS — J449 Chronic obstructive pulmonary disease, unspecified: Secondary | ICD-10-CM

## 2017-06-22 MED ORDER — PREDNISONE 10 MG PO TABS: ORAL_TABLET | ORAL | 0 refills | Status: DC

## 2017-06-22 NOTE — Patient Instructions (Addendum)
Prednisone taper over next week . Mucinex DM Twice daily  As needed  Cough/congestion .  Chest xray today .  Continue on Stiolto 2 puffs daily , rinse after use .  Refer pulmonary rehab .  Follow up with Dr. Lake Bells in 3 weeks and .As needed   Please contact office for sooner follow up if symptoms do not improve or worsen or seek emergency care

## 2017-06-22 NOTE — Assessment & Plan Note (Signed)
Recurrent flare  Verify which inhaler pt is taking .  Steroid taper  Check cxr  Pulmonary rehab   Plan  Patient Instructions  Prednisone taper over next week . Mucinex DM Twice daily  As needed  Cough/congestion .  Chest xray today .  Continue on Stiolto 2 puffs daily , rinse after use .  Refer pulmonary rehab .  Follow up with Dr. Lake Bells in 3 weeks and .As needed   Please contact office for sooner follow up if symptoms do not improve or worsen or seek emergency care

## 2017-06-22 NOTE — Progress Notes (Signed)
@Patient  ID: Antonio [name], male    DOB: [birth year], 72 y.o.   MRN: [id number]  Chief Complaint  Patient presents with  . Acute Visit    SOB    Referring provider: [name], MD  HPI: 72 year old male former smoker followed for severe COPD  TEST  April 2016 pulmonary function testing confirmed COPD which was severe with an FEV1 of 1.47 L (40% predicted). December 2017 lung function testing showed ratio 50%, FEV1 1.47 L 43% predicted, FVC 2.96 L 67% predicted   06/22/2017 Acute OV : COPD  Pt presents for an acute office visit. Went to urgent care for COPD exacerbation with cough and wheezing . Says it all started with cold like sx . More allergy symptoms with nasal drainage . Feels pollen made this worse. At urgent care was given Doxyccylnie and Predniisone . This helped .  Still having some lingering wheezing .   Pt remains on Stiolto daily. Uses albuterol As needed  .it is not clear on exactly which inhaler he is taking . Called pharmacy , and no record of rx .  He is going to check at home .   He is very deconditioined . We discussed pulmonary rehab which he is interested.        Allergies  Allergen Reactions  . Penicillins Rash    Has patient had a PCN reaction causing immediate rash, facial/tongue/throat swelling, SOB or lightheadedness with hypotension:Yes Has patient had a PCN reaction causing severe rash involving mucus membranes or skin necrosis:Yes Has patient had a PCN reaction that required hospitalization:No Has patient had a PCN reaction occurring within the last 10 years:No If all of the above answers are "NO", then may proceed with Cephalosporin use.     Immunization History  Administered Date(s) Administered  . Influenza Split 11/24/2013  . Influenza, High Dose Seasonal PF 11/05/2016  . Influenza,inj,Quad PF,6+ Mos 11/08/2014, 11/26/2015  . Influenza-Unspecified 10/11/2013, 10/11/2013  . Pneumococcal Conjugate-13 04/18/2014  .  Pneumococcal Polysaccharide-23 11/26/2015  . Tdap 02/13/2011  . Zoster 11/07/2010  . Zoster Recombinat (Shingrix) 12/01/2016, 02/20/2017    Past Medical History:  Diagnosis Date  . Allergy   . Calf pain   . COPD (chronic obstructive pulmonary disease) ([place])   . COPD (chronic obstructive pulmonary disease) ([place])   . Dyspnea   . Hypokalemia   . Internal hemorrhoids   . Muscle cramp   . Muscle pain   . Pneumonia   . PONV (postoperative nausea and vomiting)   . Tubular adenoma of colon   . Ulcerative colitis ([place])     Tobacco History: Social History   Tobacco Use  Smoking Status Former Smoker  . Packs/day: 0.75  . Years: 35.00  . Pack years: 26.25  . Types: Cigarettes, Cigars  . Last attempt to quit: 06/11/2011  . Years since quitting: 6.0  Smokeless Tobacco Never Used  Tobacco Comment   smoking cessation materials not required   Counseling given: Not Answered Comment: smoking cessation materials not required   Outpatient Encounter Medications as of 06/22/2017  Medication Sig  . albuterol (PROVENTIL HFA;VENTOLIN HFA) 108 (90 Base) MCG/ACT inhaler Inhale 2 puffs into the lungs every 6 (six) hours as needed for wheezing or shortness of breath.  Marland Kitchen albuterol (PROVENTIL) (2.5 MG/3ML) 0.083% nebulizer solution Take 3 mLs (2.5 mg total) by nebulization every 6 (six) hours as needed for wheezing or shortness of breath.  Marland Kitchen aspirin EC 81 MG tablet Take 81 mg by mouth daily.  Marland Kitchen  fluticasone (FLONASE) 50 MCG/ACT nasal spray Place 2 sprays into both nostrils daily.  Marland Kitchen guaiFENesin-codeine 100-10 MG/5ML syrup Take 5 mLs by mouth 3 (three) times daily as needed for cough.  . loperamide (IMODIUM) 2 MG capsule Take 2 mg by mouth daily as needed for diarrhea or loose stools.   . mesalamine (LIALDA) 1.2 g EC tablet Take 4 tablets (4.8 g total) by mouth daily with breakfast.  . omeprazole (PRILOSEC) 20 MG capsule   . Respiratory Therapy Supplies (FLUTTER) DEVI Use as directed  . Tiotropium  Bromide-Olodaterol (STIOLTO RESPIMAT) 2.5-2.5 MCG/ACT AERS Inhale 2 puffs into the lungs daily.  . budesonide-formoterol (SYMBICORT) 160-4.5 MCG/ACT inhaler Inhale 2 puffs into the lungs daily. (Patient not taking: Reported on 06/22/2017)  . EPINEPHrine 0.3 mg/0.3 mL IJ SOAJ injection Inject 0.3 mLs (0.3 mg total) into the muscle once. (Patient not taking: Reported on 06/22/2017)  . predniSONE (DELTASONE) 10 MG tablet 4 tabs for 3 days, then 3 tabs for 3 days, 2 tabs for 3 days, then 1 tab for 3 days, then stop  . predniSONE (DELTASONE) 50 MG tablet 1 tablet daily x 5 days (Patient not taking: Reported on 06/22/2017)  . [DISCONTINUED] doxycycline (VIBRAMYCIN) 100 MG capsule Take 1 capsule (100 mg total) by mouth 2 (two) times daily. (Patient not taking: Reported on 06/22/2017)  . [DISCONTINUED] ipratropium-albuterol (DUONEB) 0.5-2.5 (3) MG/3ML SOLN INHALE 1 VIAL VIA NEBULIZER EVERY 6 HOURS AS NEEDED FOR SHORTNESS OF BREATH (Patient not taking: Reported on 06/22/2017)   Facility-Administered Encounter Medications as of 06/22/2017  Medication  . albuterol (PROVENTIL) (2.5 MG/3ML) 0.083% nebulizer solution 2.5 mg     Review of Systems  Constitutional:   No  weight loss, night sweats,  Fevers, chills,  +fatigue, or  lassitude.  HEENT:   No headaches,  Difficulty swallowing,  Tooth/dental problems, or  Sore throat,                No sneezing, itching, ear ache, nasal congestion, post nasal drip,   CV:  No chest pain,  Orthopnea, PND, swelling in lower extremities, anasarca, dizziness, palpitations, syncope.   GI  No heartburn, indigestion, abdominal pain, nausea, vomiting, diarrhea, change in bowel habits, loss of appetite, bloody stools.   Resp:  No chest wall deformity  Skin: no rash or lesions.  GU: no dysuria, change in color of urine, no urgency or frequency.  No flank pain, no hematuria   MS:  No joint pain or swelling.  No decreased range of motion.  No back pain.    Physical  Exam  BP 104/60 (BP Location: Left Arm, Cuff Size: Normal)   Pulse 60   Temp 98.2 F (36.8 C) (Oral)   Ht 5' 11"  (1.803 m)   Wt 140 lb 3.2 oz (63.6 kg)   SpO2 95%   BMI 19.55 kg/m   GEN: A/Ox3; pleasant , NAD thin and frail    HEENT:  [place]/AT,  EACs-clear, TMs-wnl, NOSE-clear, THROAT-clear, no lesions, no postnasal drip or exudate noted.   NECK:  Supple w/ fair ROM; no JVD; normal carotid impulses w/o bruits; no thyromegaly or nodules palpated; no lymphadenopathy.    RESP  Few trace exp wheezes , . no accessory muscle use, no dullness to percussion  CARD:  RRR, no m/r/g, no peripheral edema, pulses intact, no cyanosis or clubbing.  GI:   Soft & nt; nml bowel sounds; no organomegaly or masses detected.   Musco: Warm bil, no deformities or joint swelling noted.  Neuro: alert, no focal deficits noted.    Skin: Warm, no lesions or rashes    Lab Results:  CBC  BMET   BNP No results found for: BNP  ProBNP No results found for: PROBNP  Imaging: No results found.   Assessment & Plan:   COPD with acute exacerbation ([place]) Recurrent flare  Verify which inhaler pt is taking .  Steroid taper  Check cxr  Pulmonary rehab   Plan  Patient Instructions  Prednisone taper over next week . Mucinex DM Twice daily  As needed  Cough/congestion .  Chest xray today .  Continue on Stiolto 2 puffs daily , rinse after use .  Refer pulmonary rehab .  Follow up with Dr. Lake Bells in 3 weeks and .As needed   Please contact office for sooner follow up if symptoms do not improve or worsen or seek emergency care            Rexene Edison, NP 06/22/2017

## 2017-06-23 ENCOUNTER — Encounter: Payer: Self-pay | Admitting: Adult Health

## 2017-06-23 MED ORDER — TIOTROPIUM BROMIDE-OLODATEROL 2.5-2.5 MCG/ACT IN AERS: 2.0000 | INHALATION_SPRAY | Freq: Every day | RESPIRATORY_TRACT | 5 refills | Status: DC

## 2017-06-23 NOTE — Progress Notes (Signed)
Reviewed, agree 

## 2017-06-23 NOTE — Addendum Note (Signed)
Addended by: Parke Poisson E on: 06/23/2017 10:32 AM   Modules accepted: Orders

## 2017-06-23 NOTE — Telephone Encounter (Signed)
Per TP: thank you for getting back with [place] so quickly!  Would recommend to continue the Stiolto as discussed yesterday, and do not take the Symbicort.  Thank you.  Will also inform patient of his cxr results from yesterday's visit Will sign off

## 2017-06-23 NOTE — Telephone Encounter (Signed)
TP please advise, here are the two medications that the patient was prescribed by Dr. Lake Bells. Antonio [name] has enough of both that he would not need refills. Please advise on which one he needs to be taking. Thank you!

## 2017-07-15 ENCOUNTER — Other Ambulatory Visit: Payer: Self-pay | Admitting: Pulmonary Disease

## 2017-07-15 ENCOUNTER — Telehealth: Payer: Self-pay | Admitting: Pulmonary Disease

## 2017-07-15 ENCOUNTER — Encounter: Payer: Self-pay | Admitting: Adult Health

## 2017-07-15 MED ORDER — PREDNISONE 20 MG PO TABS: 20.0000 mg | ORAL_TABLET | Freq: Every day | ORAL | 0 refills | Status: DC

## 2017-07-15 NOTE — Telephone Encounter (Signed)
Returned Patient call.  Explained BQ recommendations. Patient stated understanding.  Prednisone 8m daily x's 5 days sent to preferred pharmacy.  Patient has appointment 07/20/17 with BQ. Nothing further needed at this time.

## 2017-07-15 NOTE — Telephone Encounter (Signed)
Patient sent email this morning complaining of runny nose and shortness of breath. Called Patient and he stated that he was awake a 2 am with shortness of breath and wheezing.  He has no fever.  He has been using his Albuterol inhaler and Albuterol nebs every 6 hours with little relief.  He has upcoming appointment 07/20/17 at 1000.     BQ please advise

## 2017-07-15 NOTE — Telephone Encounter (Signed)
Prednisone 61m daily x 5 days  Call if no improvement

## 2017-07-15 NOTE — Telephone Encounter (Signed)
Patient is returning call.  °

## 2017-07-15 NOTE — Telephone Encounter (Signed)
atc pt on home #, line rang to fast busy signal. lmtcb on pt's mobile #.   Wcb.

## 2017-07-20 ENCOUNTER — Ambulatory Visit: Payer: PPO | Admitting: Pulmonary Disease

## 2017-07-20 ENCOUNTER — Encounter: Payer: Self-pay | Admitting: Pulmonary Disease

## 2017-07-20 VITALS — BP 112/60 | HR 72 | Ht 71.0 in | Wt 144.4 lb

## 2017-07-20 DIAGNOSIS — J441 Chronic obstructive pulmonary disease with (acute) exacerbation: Secondary | ICD-10-CM | POA: Diagnosis not present

## 2017-07-20 DIAGNOSIS — J449 Chronic obstructive pulmonary disease, unspecified: Secondary | ICD-10-CM

## 2017-07-20 NOTE — Progress Notes (Signed)
Subjective:    Patient ID: Antonio [name], male    DOB: Jul 23, 1945, 72 y.o.   MRN: [id number]  Synopsis: He was referred for shortness of breath in 2016. He had a lengthy smoking history but quit smoking in 2014. April 2016 pulmonary function testing confirmed COPD which was severe with an FEV1 of 1.47 L (40% predicted).   HPI Chief Complaint  Patient presents with  . Follow-up    He states when on prednisone 54m daily he does well and does not have problem breathing everyday. When not taking it he is SOB ,Wheezing.   Antonio [name] to [place] for some prednisone because he was wheezing a lot.  His breathing was short and he says that the pollen "was killing him".  Some days are better than others.  He says that some days are worse than others.  He has been stopping to catch his breath more frequently, but this seems to vary from week to week.  They can't pinpoint a cause of the variability.  He uses albuterol throughout the day and either a nebulized or rescue inhaler form.  He uses pro-air.  He is currently taking Stiolto 2 puffs daily.    Past Medical History:  Diagnosis Date  . Allergy   . Calf pain   . [place]) ([place]   . COPD ([place] disease) [place]   . Dyspnea   . Hypokalemia   . Internal hemorrhoids   . Muscle cramp   . Muscle pain   . Pneumonia   . PONV (postoperative nausea and vomiting)   . Tubular adenoma of colon   . Ulcerative colitis ([place]       Review of Systems  Constitutional: Negative for chills, fatigue and fever.  HENT: Negative for postnasal drip, rhinorrhea and sinus pain.   Respiratory: Positive for cough, shortness of breath and wheezing.   Cardiovascular: Negative for chest pain, palpitations and leg swelling.       Objective:   Physical Exam  Vitals:   07/20/17 1002 07/20/17 1006  BP: 112/60   Pulse: 72   SpO2: 93% 95%  Weight: 144 lb 6.4 oz (65.5 kg)   Height: 5'  11" (1.803 m)    RA  Gen: well appearing HENT: OP clear, TM's clear, neck supple PULM: Slight wheeze RUL B, normal percussion CV: RRR, no mgr, trace edema GI: BS+, soft, nontender Derm: no cyanosis or rash Psyche: normal mood and affect   CBC    Component Value Date/Time   WBC 10.0 05/28/2016 1450   RBC 5.01 05/28/2016 1450   HGB 15.3 05/28/2016 1450   HGB 15.2 11/26/2015 0933   HCT 45.6 05/28/2016 1450   HCT 43.9 11/26/2015 0933   PLT 277.0 05/28/2016 1450   PLT 247 11/26/2015 0933   MCV 91.1 05/28/2016 1450   MCV 90 11/26/2015 0933   MCV 95 10/01/2013 2131   MCH 31.0 04/28/2016 1433   MCHC 33.6 05/28/2016 1450   RDW 13.1 05/28/2016 1450   RDW 13.6 11/26/2015 0933   RDW 12.4 10/01/2013 2131   LYMPHSABS 2.1 05/28/2016 1450   MONOABS 1.4 (H) 05/28/2016 1450   EOSABS 0.7 05/28/2016 1450   BASOSABS 0.1 05/28/2016 1450   PFT December 2017 lung function testing showed ratio 50%, FEV1 1.47 L 43% predicted, FVC 2.96 L 67% predicted       Assessment & Plan:   Centrilobular Emphysema  COPD with acute exacerbation ([place]  Discussion: Antonio [name] has struggled with exacerbations recently.  We noticed a year ago that his serum eosinophil count was elevated.  He does better with prednisone.  He has somehow managed to show up in my clinic on a different controller medicine for his COPD on nearly every visit.  We spent a significant amount of time today talking about the fact that this creates confusion regarding management of his disease.  I do worry that there is an underlying allergy which causes his repeated exacerbations.  I think the best approach moving forward is to have him take Symbicort 2 puffs twice a day with a spacer and follow-up in 4 weeks to see how things are going.  I agree that pulmonary rehab may be helpful.  Plan: COPD: Take Symbicort 2 puffs twice a day no matter how you feel Before anybody decides to change this medicine make sure they checked with me  first Stop Stiolto Use albuterol (ProAir air or your nebulizer) every 4-6 hours as needed for chest tightness wheezing or shortness of breath If you still feel breathing difficulty and wheezing on the next visit then we will start a work-up for an allergy problem Go to pulmonary rehabilitation  We will see you back in 4 weeks with either me or a nurse practitioner  > 50% of this 25 minute visit spent face to face   Current Outpatient Medications:  .  albuterol (PROVENTIL HFA;VENTOLIN HFA) 108 (90 Base) MCG/ACT inhaler, Inhale 2 puffs into the lungs every 6 (six) hours as needed for wheezing or shortness of breath., Disp: 3 Inhaler, Rfl: 3 .  albuterol (PROVENTIL) (2.5 MG/3ML) 0.083% nebulizer solution, Take 3 mLs (2.5 mg total) by nebulization every 6 (six) hours as needed for wheezing or shortness of breath., Disp: 75 mL, Rfl: 12 .  aspirin EC 81 MG tablet, Take 81 mg by mouth daily., Disp: , Rfl:  .  EPINEPHrine 0.3 mg/0.3 mL IJ SOAJ injection, Inject 0.3 mLs (0.3 mg total) into the muscle once., Disp: 1 Device, Rfl: 1 .  fluticasone (FLONASE) 50 MCG/ACT nasal spray, Place 2 sprays into both nostrils daily., Disp: 16 g, Rfl: 6 .  loperamide (IMODIUM) 2 MG capsule, Take 2 mg by mouth daily as needed for diarrhea or loose stools. , Disp: , Rfl:  .  mesalamine (LIALDA) 1.2 g EC tablet, Take 4 tablets (4.8 g total) by mouth daily with breakfast. (Patient taking differently: Take 2.4 g by mouth daily with breakfast. ), Disp: 360 tablet, Rfl: 1 .  Tiotropium Bromide-Olodaterol (STIOLTO RESPIMAT) 2.5-2.5 MCG/ACT AERS, Inhale 2 puffs into the lungs daily., Disp: 4 g, Rfl: 5 .  omeprazole (PRILOSEC) 20 MG capsule, , Disp: , Rfl:  .  Respiratory Therapy Supplies (FLUTTER) DEVI, Use as directed (Patient not taking: Reported on 07/20/2017), Disp: 1 each, Rfl: 0  Current Facility-Administered Medications:  .  albuterol (PROVENTIL) (2.5 MG/3ML) 0.083% nebulizer solution 2.5 mg, 2.5 mg, Nebulization, Once,  [name], MD

## 2017-07-20 NOTE — Patient Instructions (Signed)
COPD: Take Symbicort 2 puffs twice a day no matter how you feel, use a spacer Before anybody decides to change this medicine make sure they checked with me first Stop Stiolto Use albuterol (ProAir air or your nebulizer) every 4-6 hours as needed for chest tightness wheezing or shortness of breath If you still feel breathing difficulty and wheezing on the next visit then we will start a work-up for an allergy problem Go to pulmonary rehabilitation  We will see you back in 4 weeks with either me or a nurse practitioner

## 2017-08-03 ENCOUNTER — Other Ambulatory Visit: Payer: Self-pay

## 2017-08-03 ENCOUNTER — Encounter: Payer: PPO | Attending: Pulmonary Disease

## 2017-08-03 VITALS — Ht 70.9 in | Wt 144.9 lb

## 2017-08-03 DIAGNOSIS — Z8701 Personal history of pneumonia (recurrent): Secondary | ICD-10-CM | POA: Insufficient documentation

## 2017-08-03 DIAGNOSIS — Z79899 Other long term (current) drug therapy: Secondary | ICD-10-CM | POA: Insufficient documentation

## 2017-08-03 DIAGNOSIS — J449 Chronic obstructive pulmonary disease, unspecified: Secondary | ICD-10-CM

## 2017-08-03 DIAGNOSIS — K5190 Ulcerative colitis, unspecified, without complications: Secondary | ICD-10-CM | POA: Diagnosis not present

## 2017-08-03 DIAGNOSIS — Z7982 Long term (current) use of aspirin: Secondary | ICD-10-CM | POA: Diagnosis not present

## 2017-08-03 DIAGNOSIS — Z7951 Long term (current) use of inhaled steroids: Secondary | ICD-10-CM | POA: Diagnosis not present

## 2017-08-03 DIAGNOSIS — Z87891 Personal history of nicotine dependence: Secondary | ICD-10-CM | POA: Diagnosis not present

## 2017-08-03 NOTE — Progress Notes (Signed)
Pulmonary Individual Treatment Plan  Patient Details  Name: Antonio [name] MRN: [id number] Date of Birth: 01/05/1946 Referring Provider:     Pulmonary Rehab from 08/03/2017 in [facility] Cardiac and Pulmonary Rehab  Referring Provider  Simonne Maffucci MD      Initial Encounter Date:    Pulmonary Rehab from 08/03/2017 in [facility] Cardiac and Pulmonary Rehab  Date  08/03/17  Referring Provider  Simonne Maffucci MD      Visit Diagnosis: Chronic obstructive pulmonary disease, unspecified COPD type ([place])  Patient's Home Medications on Admission:  Current Outpatient Medications:  .  albuterol (PROVENTIL HFA;VENTOLIN HFA) 108 (90 Base) MCG/ACT inhaler, Inhale 2 puffs into the lungs every 6 (six) hours as needed for wheezing or shortness of breath., Disp: 3 Inhaler, Rfl: 3 .  albuterol (PROVENTIL) (2.5 MG/3ML) 0.083% nebulizer solution, Take 3 mLs (2.5 mg total) by nebulization every 6 (six) hours as needed for wheezing or shortness of breath., Disp: 75 mL, Rfl: 12 .  aspirin EC 81 MG tablet, Take 81 mg by mouth daily., Disp: , Rfl:  .  budesonide-formoterol (SYMBICORT) 160-4.5 MCG/ACT inhaler, Inhale 2 puffs into the lungs 2 (two) times daily., Disp: , Rfl:  .  EPINEPHrine 0.3 mg/0.3 mL IJ SOAJ injection, Inject 0.3 mLs (0.3 mg total) into the muscle once., Disp: 1 Device, Rfl: 1 .  fluticasone (FLONASE) 50 MCG/ACT nasal spray, Place 2 sprays into both nostrils daily., Disp: 16 g, Rfl: 6 .  loperamide (IMODIUM) 2 MG capsule, Take 2 mg by mouth daily as needed for diarrhea or loose stools. , Disp: , Rfl:  .  mesalamine (LIALDA) 1.2 g EC tablet, Take 4 tablets (4.8 g total) by mouth daily with breakfast. (Patient taking differently: Take 2.4 g by mouth daily with breakfast. ), Disp: 360 tablet, Rfl: 1 .  omeprazole (PRILOSEC) 20 MG capsule, , Disp: , Rfl:  .  Respiratory Therapy Supplies (FLUTTER) DEVI, Use as directed (Patient not taking: Reported on 07/20/2017), Disp: 1 each, Rfl: 0  Current  Facility-Administered Medications:  .  albuterol (PROVENTIL) (2.5 MG/3ML) 0.083% nebulizer solution 2.5 mg, 2.5 mg, Nebulization, Once, [name], MD  Past Medical History: Past Medical History:  Diagnosis Date  . Allergy   . Calf pain   . COPD (chronic obstructive pulmonary disease) ([place])   . COPD (chronic obstructive pulmonary disease) ([place])   . Dyspnea   . Hypokalemia   . Internal hemorrhoids   . Muscle cramp   . Muscle pain   . Pneumonia   . PONV (postoperative nausea and vomiting)   . Tubular adenoma of colon   . Ulcerative colitis ([place])     Tobacco Use: Social History   Tobacco Use  Smoking Status Former Smoker  . Packs/day: 0.75  . Years: 35.00  . Pack years: 26.25  . Types: Cigarettes, Cigars  . Last attempt to quit: 06/11/2011  . Years since quitting: 6.1  Smokeless Tobacco Never Used  Tobacco Comment   smoking cessation materials not required    Labs: Recent Review Flowsheet Data    Labs for ITP Cardiac and Pulmonary Rehab Latest Ref Rng & Units 11/08/2014 12/01/2016   Cholestrol 100 - 199 mg/dL 154 151   LDLCALC 0 - 99 mg/dL 89 91   HDL >39 mg/dL 53 50   Trlycerides 0 - 149 mg/dL 61 51       Pulmonary Assessment Scores: Pulmonary Assessment Scores    Row Name 08/03/17 1435  ADL UCSD   ADL Phase  Entry     SOB Score total  45     Rest  0     Walk  3     Stairs  5     Bath  1     Dress  2     Shop  3       CAT Score   CAT Score  13       mMRC Score   mMRC Score  2        Pulmonary Function Assessment: Pulmonary Function Assessment - 08/03/17 1436      Pulmonary Function Tests   FVC%  59 % test performed on 06/07/14    FEV1%  40 %    FEV1/FVC Ratio  51      Breath   Bilateral Breath Sounds  Clear    Shortness of Breath  Limiting activity;Yes       Exercise Target Goals: Date: 08/03/17  Exercise Program Goal: Individual exercise prescription set using results from initial 6 min walk test and THRR while  considering  patient's activity barriers and safety.    Exercise Prescription Goal: Initial exercise prescription builds to 30-45 minutes a day of aerobic activity, 2-3 days per week.  Home exercise guidelines will be given to patient during program as part of exercise prescription that the participant will acknowledge.  Activity Barriers & Risk Stratification: Activity Barriers & Cardiac Risk Stratification - 08/03/17 1531      Activity Barriers & Cardiac Risk Stratification   Activity Barriers  Deconditioning;Shortness of Breath       6 Minute Walk: 6 Minute Walk    Row Name 08/03/17 1528         6 Minute Walk   Phase  Initial     Distance  1515 feet     Walk Time  6 minutes     # of Rest Breaks  0     MPH  2.87     METS  3.44     RPE  12     Perceived Dyspnea   3     VO2 Peak  12.42     Symptoms  Yes (comment)     Comments  SOB, cramp in left calf     Resting HR  63 bpm     Resting BP  134/60     Resting Oxygen Saturation   99 %     Exercise Oxygen Saturation  during 6 min walk  85 %     Max Ex. HR  74 bpm     Max Ex. BP  150/64     2 Minute Post BP  142/60       Interval HR   1 Minute HR  69     2 Minute HR  67     3 Minute HR  68     4 Minute HR  69     5 Minute HR  68     6 Minute HR  84     2 Minute Post HR  75     Interval Heart Rate?  Yes       Interval Oxygen   Interval Oxygen?  Yes     Baseline Oxygen Saturation %  99 %     1 Minute Oxygen Saturation %  95 %     1 Minute Liters of Oxygen  0 L Room Air     2 Minute Oxygen  Saturation %  90 %     2 Minute Liters of Oxygen  0 L     3 Minute Oxygen Saturation %  93 %     3 Minute Liters of Oxygen  0 L     4 Minute Oxygen Saturation %  87 %     4 Minute Liters of Oxygen  0 L     5 Minute Oxygen Saturation %  89 %     5 Minute Liters of Oxygen  0 L     6 Minute Oxygen Saturation %  85 %     6 Minute Liters of Oxygen  0 L     2 Minute Post Oxygen Saturation %  97 %     2 Minute Post Liters of  Oxygen  0 L       Oxygen Initial Assessment: Oxygen Initial Assessment - 08/03/17 1439      Home Oxygen   Home Oxygen Device  None    Sleep Oxygen Prescription  None    Home Exercise Oxygen Prescription  None    Home at Rest Exercise Oxygen Prescription  None      Initial 6 min Walk   Oxygen Used  None      Program Oxygen Prescription   Program Oxygen Prescription  None      Intervention   Short Term Goals  To learn and demonstrate proper use of respiratory medications;To learn and demonstrate proper pursed lip breathing techniques or other breathing techniques.;To learn and understand importance of maintaining oxygen saturations>88%;To learn and understand importance of monitoring SPO2 with pulse oximeter and demonstrate accurate use of the pulse oximeter.    Long  Term Goals  Verbalizes importance of monitoring SPO2 with pulse oximeter and return demonstration;Maintenance of O2 saturations>88%;Exhibits proper breathing techniques, such as pursed lip breathing or other method taught during program session;Demonstrates proper use of MDI's;Compliance with respiratory medication       Oxygen Re-Evaluation:   Oxygen Discharge (Final Oxygen Re-Evaluation):   Initial Exercise Prescription: Initial Exercise Prescription - 08/03/17 1500      Date of Initial Exercise RX and Referring Provider   Date  08/03/17    Referring Provider  Simonne Maffucci MD      Treadmill   MPH  2.8    Grade  0.5    Minutes  15    METs  3.34      REL-XR   Level  2    Speed  50    Minutes  15    METs  3      T5 Nustep   Level  2    SPM  80    Minutes  15    METs  3      Prescription Details   Frequency (times per week)  2    Duration  Progress to 45 minutes of aerobic exercise without signs/symptoms of physical distress      Intensity   THRR 40-80% of Max Heartrate  96-129    Ratings of Perceived Exertion  11-13    Perceived Dyspnea  0-4      Progression   Progression  Continue to  progress workloads to maintain intensity without signs/symptoms of physical distress.      Resistance Training   Training Prescription  Yes    Weight  4 lbs    Reps  10-15       Perform Capillary Blood Glucose checks as needed.  Exercise Prescription Changes:  Exercise Prescription Changes    Row Name 08/03/17 1500             Response to Exercise   Blood Pressure (Admit)  134/60       Blood Pressure (Exercise)  150/64       Blood Pressure (Exit)  142/60       Heart Rate (Admit)  63 bpm       Heart Rate (Exercise)  84 bpm       Heart Rate (Exit)  75 bpm       Oxygen Saturation (Admit)  99 %       Oxygen Saturation (Exercise)  85 %       Oxygen Saturation (Exit)  97 %       Rating of Perceived Exertion (Exercise)  12       Perceived Dyspnea (Exercise)  3       Symptoms  SOB, cramp in left calf       Comments  walk test results          Exercise Comments:   Exercise Goals and Review: Exercise Goals    Row Name 08/03/17 1533             Exercise Goals   Increase Physical Activity  Yes       Intervention  Provide advice, education, support and counseling about physical activity/exercise needs.;Develop an individualized exercise prescription for aerobic and resistive training based on initial evaluation findings, risk stratification, comorbidities and participant's personal goals.       Expected Outcomes  Short Term: Attend rehab on a regular basis to increase amount of physical activity.;Long Term: Add in home exercise to make exercise part of routine and to increase amount of physical activity.;Long Term: Exercising regularly at least 3-5 days a week.       Increase Strength and Stamina  Yes       Intervention  Provide advice, education, support and counseling about physical activity/exercise needs.;Develop an individualized exercise prescription for aerobic and resistive training based on initial evaluation findings, risk stratification, comorbidities and  participant's personal goals.       Expected Outcomes  Short Term: Increase workloads from initial exercise prescription for resistance, speed, and METs.;Short Term: Perform resistance training exercises routinely during rehab and add in resistance training at home;Long Term: Improve cardiorespiratory fitness, muscular endurance and strength as measured by increased METs and functional capacity (6MWT)       Able to understand and use rate of perceived exertion (RPE) scale  Yes       Intervention  Provide education and explanation on how to use RPE scale       Expected Outcomes  Short Term: Able to use RPE daily in rehab to express subjective intensity level;Long Term:  Able to use RPE to guide intensity level when exercising independently       Able to understand and use Dyspnea scale  Yes       Intervention  Provide education and explanation on how to use Dyspnea scale       Expected Outcomes  Short Term: Able to use Dyspnea scale daily in rehab to express subjective sense of shortness of breath during exertion;Long Term: Able to use Dyspnea scale to guide intensity level when exercising independently       Knowledge and understanding of Target Heart Rate Range (THRR)  Yes       Intervention  Provide education and explanation of THRR including how the numbers were  predicted and where they are located for reference       Expected Outcomes  Short Term: Able to state/look up THRR;Short Term: Able to use daily as guideline for intensity in rehab;Long Term: Able to use THRR to govern intensity when exercising independently       Able to check pulse independently  Yes       Intervention  Provide education and demonstration on how to check pulse in carotid and radial arteries.;Review the importance of being able to check your own pulse for safety during independent exercise       Expected Outcomes  Short Term: Able to explain why pulse checking is important during independent exercise;Long Term: Able to check  pulse independently and accurately       Understanding of Exercise Prescription  Yes       Intervention  Provide education, explanation, and written materials on patient's individual exercise prescription       Expected Outcomes  Short Term: Able to explain program exercise prescription;Long Term: Able to explain home exercise prescription to exercise independently          Exercise Goals Re-Evaluation :   Discharge Exercise Prescription (Final Exercise Prescription Changes): Exercise Prescription Changes - 08/03/17 1500      Response to Exercise   Blood Pressure (Admit)  134/60    Blood Pressure (Exercise)  150/64    Blood Pressure (Exit)  142/60    Heart Rate (Admit)  63 bpm    Heart Rate (Exercise)  84 bpm    Heart Rate (Exit)  75 bpm    Oxygen Saturation (Admit)  99 %    Oxygen Saturation (Exercise)  85 %    Oxygen Saturation (Exit)  97 %    Rating of Perceived Exertion (Exercise)  12    Perceived Dyspnea (Exercise)  3    Symptoms  SOB, cramp in left calf    Comments  walk test results       Nutrition:  Target Goals: Understanding of nutrition guidelines, daily intake of sodium <154m, cholesterol <2040m calories 30% from fat and 7% or less from saturated fats, daily to have 5 or more servings of fruits and vegetables.  Biometrics: Pre Biometrics - 08/03/17 1533      Pre Biometrics   Height  5' 10.9" (1.801 m)    Weight  144 lb 14.4 oz (65.7 kg)    Waist Circumference  36 inches    Hip Circumference  37 inches    Waist to Hip Ratio  0.97 %    BMI (Calculated)  20.26    Single Leg Stand  30 seconds        Nutrition Therapy Plan and Nutrition Goals: Nutrition Therapy & Goals - 08/03/17 1433      Personal Nutrition Goals   Comments  His wife cooks for him and they go out sometimes. He feels like he eats realitively healthy. He would like to meet with the dietician to learn if he is getting enough nutrients.      Intervention Plan   Intervention  Prescribe,  educate and counsel regarding individualized specific dietary modifications aiming towards targeted core components such as weight, hypertension, lipid management, diabetes, heart failure and other comorbidities.    Expected Outcomes  Short Term Goal: Understand basic principles of dietary content, such as calories, fat, sodium, cholesterol and nutrients.;Long Term Goal: Adherence to prescribed nutrition plan.       Nutrition Assessments: Nutrition Assessments - 08/03/17 1432  MEDFICTS Scores   Pre Score  82       Nutrition Goals Re-Evaluation:   Nutrition Goals Discharge (Final Nutrition Goals Re-Evaluation):   Psychosocial: Target Goals: Acknowledge presence or absence of significant depression and/or stress, maximize coping skills, provide positive support system. Participant is able to verbalize types and ability to use techniques and skills needed for reducing stress and depression.   Initial Review & Psychosocial Screening: Initial Psych Review & Screening - 08/03/17 1429      Initial Review   Current issues with  Current Stress Concerns    Source of Stress Concerns  Chronic Illness    Comments  COPD is his main concern with his stress.      Family Dynamics   Good Support System?  Yes    Comments  He can look to his wife, dog and two childeren for support.      Barriers   Psychosocial barriers to participate in program  The patient should benefit from training in stress management and relaxation.      Screening Interventions   Interventions  To provide support and resources with identified psychosocial needs;Encouraged to exercise;Program counselor consult;Provide feedback about the scores to participant    Expected Outcomes  Short Term goal: Utilizing psychosocial counselor, staff and physician to assist with identification of specific Stressors or current issues interfering with healing process. Setting desired goal for each stressor or current issue  identified.;Long Term Goal: Stressors or current issues are controlled or eliminated.;Short Term goal: Identification and review with participant of any Quality of Life or Depression concerns found by scoring the questionnaire.;Long Term goal: The participant improves quality of Life and PHQ9 Scores as seen by post scores and/or verbalization of changes       Quality of Life Scores:  Scores of 19 and below usually indicate a poorer quality of life in these areas.  A difference of  2-3 points is a clinically meaningful difference.  A difference of 2-3 points in the total score of the Quality of Life Index has been associated with significant improvement in overall quality of life, self-image, physical symptoms, and general health in studies assessing change in quality of life.  PHQ-9: Recent Review Flowsheet Data    Depression screen [facility] 2/9 08/03/2017 04/16/2017 04/16/2017 06/17/2016 04/30/2015   Decreased Interest 1 0 0 0 0   Down, Depressed, Hopeless - 0 0 0 0   PHQ - 2 Score 1 0 0 0 0   Altered sleeping 0 0 - - -   Tired, decreased energy 2 0 - - -   Change in appetite 1 0 - - -   Feeling bad or failure about yourself  1 0 - - -   Trouble concentrating 0 0 - - -   Moving slowly or fidgety/restless 0 0 - - -   Suicidal thoughts 0 0 - - -   PHQ-9 Score 5 0 - - -   Difficult doing work/chores Somewhat difficult Not difficult at all - - -     Interpretation of Total Score  Total Score Depression Severity:  1-4 = Minimal depression, 5-9 = Mild depression, 10-14 = Moderate depression, 15-19 = Moderately severe depression, 20-27 = Severe depression   Psychosocial Evaluation and Intervention:   Psychosocial Re-Evaluation:   Psychosocial Discharge (Final Psychosocial Re-Evaluation):   Education: Education Goals: Education classes will be provided on a weekly basis, covering required topics. Participant will state understanding/return demonstration of topics presented.  Learning  Barriers/Preferences:  Learning Barriers/Preferences - 08/03/17 1438      Learning Barriers/Preferences   Learning Barriers  Sight prescription do    Learning Preferences  None       Education Topics:  Initial Evaluation Education: - Verbal, written and demonstration of respiratory meds, oximetry and breathing techniques. Instruction on use of nebulizers and MDIs and importance of monitoring MDI activations.   Pulmonary Rehab from 08/03/2017 in [facility] Cardiac and Pulmonary Rehab  Date  08/03/17  Educator  [facility]  Instruction Review Code  1- Verbalizes Understanding      General Nutrition Guidelines/Fats and Fiber: -Group instruction provided by verbal, written material, models and posters to present the general guidelines for heart healthy nutrition. Gives an explanation and review of dietary fats and fiber.   Controlling Sodium/Reading Food Labels: -Group verbal and written material supporting the discussion of sodium use in heart healthy nutrition. Review and explanation with models, verbal and written materials for utilization of the food label.   Exercise Physiology & General Exercise Guidelines: - Group verbal and written instruction with models to review the exercise physiology of the cardiovascular system and associated critical values. Provides general exercise guidelines with specific guidelines to those with heart or lung disease.    Aerobic Exercise & Resistance Training: - Gives group verbal and written instruction on the various components of exercise. Focuses on aerobic and resistive training programs and the benefits of this training and how to safely progress through these programs.   Flexibility, Balance, Mind/Body Relaxation: Provides group verbal/written instruction on the benefits of flexibility and balance training, including mind/body exercise modes such as yoga, pilates and tai chi.  Demonstration and skill practice provided.   Stress and Anxiety: - Provides  group verbal and written instruction about the health risks of elevated stress and causes of high stress.  Discuss the correlation between heart/lung disease and anxiety and treatment options. Review healthy ways to manage with stress and anxiety.   Depression: - Provides group verbal and written instruction on the correlation between heart/lung disease and depressed mood, treatment options, and the stigmas associated with seeking treatment.   Exercise & Equipment Safety: - Individual verbal instruction and demonstration of equipment use and safety with use of the equipment.   Pulmonary Rehab from 08/03/2017 in [facility] Cardiac and Pulmonary Rehab  Date  08/03/17  Educator  [facility]  Instruction Review Code  1- Verbalizes Understanding      Infection Prevention: - Provides verbal and written material to individual with discussion of infection control including proper hand washing and proper equipment cleaning during exercise session.   Pulmonary Rehab from 08/03/2017 in [facility] Cardiac and Pulmonary Rehab  Date  08/03/17  Educator  [facility]  Instruction Review Code  1- Verbalizes Understanding      Falls Prevention: - Provides verbal and written material to individual with discussion of falls prevention and safety.   Pulmonary Rehab from 08/03/2017 in [facility] Cardiac and Pulmonary Rehab  Date  08/03/17  Educator  [facility]  Instruction Review Code  1- Verbalizes Understanding      Diabetes: - Individual verbal and written instruction to review signs/symptoms of diabetes, desired ranges of glucose level fasting, after meals and with exercise. Advice that pre and post exercise glucose checks will be done for 3 sessions at entry of program.   Chronic Lung Diseases: - Group verbal and written instruction to review updates, respiratory medications, advancements in procedures and treatments. Discuss use of supplemental oxygen including available portable oxygen systems, continuous and intermittent  flow rates,  concentrators, personal use and safety guidelines. Review proper use of inhaler and spacers. Provide informative websites for self-education.    Energy Conservation: - Provide group verbal and written instruction for methods to conserve energy, plan and organize activities. Instruct on pacing techniques, use of adaptive equipment and posture/positioning to relieve shortness of breath.   Triggers and Exacerbations: - Group verbal and written instruction to review types of environmental triggers and ways to prevent exacerbations. Discuss weather changes, air quality and the benefits of nasal washing. Review warning signs and symptoms to help prevent infections. Discuss techniques for effective airway clearance, coughing, and vibrations.   AED/CPR: - Group verbal and written instruction with the use of models to demonstrate the basic use of the AED with the basic ABC's of resuscitation.   Anatomy and Physiology of the Lungs: - Group verbal and written instruction with the use of models to provide basic lung anatomy and physiology related to function, structure and complications of lung disease.   Anatomy & Physiology of the Heart: - Group verbal and written instruction and models provide basic cardiac anatomy and physiology, with the coronary electrical and arterial systems. Review of Valvular disease and Heart Failure   Cardiac Medications: - Group verbal and written instruction to review commonly prescribed medications for heart disease. Reviews the medication, class of the drug, and side effects.   Know Your Numbers and Risk Factors: -Group verbal and written instruction about important numbers in your health.  Discussion of what are risk factors and how they play a role in the disease process.  Review of Cholesterol, Blood Pressure, Diabetes, and BMI and the role they play in your overall health.   Sleep Hygiene: -Provides group verbal and written instruction about how sleep can  affect your health.  Define sleep hygiene, discuss sleep cycles and impact of sleep habits. Review good sleep hygiene tips.    Other: -Provides group and verbal instruction on various topics (see comments)    Knowledge Questionnaire Score: Knowledge Questionnaire Score - 08/03/17 1436      Knowledge Questionnaire Score   Pre Score  13/18 reviewed with patient        Core Components/Risk Factors/Patient Goals at Admission: Personal Goals and Risk Factors at Admission - 08/03/17 1443      Core Components/Risk Factors/Patient Goals on Admission    Weight Management  Yes;Weight Maintenance    Intervention  Weight Management: Develop a combined nutrition and exercise program designed to reach desired caloric intake, while maintaining appropriate intake of nutrient and fiber, sodium and fats, and appropriate energy expenditure required for the weight goal.;Weight Management: Provide education and appropriate resources to help participant work on and attain dietary goals.;Weight Management/Obesity: Establish reasonable short term and long term weight goals.    Admit Weight  144 lb 14.4 oz (65.7 kg)    Goal Weight: Short Term  145 lb (65.8 kg)    Goal Weight: Long Term  145 lb (65.8 kg)    Expected Outcomes  Short Term: Continue to assess and modify interventions until short term weight is achieved;Weight Maintenance: Understanding of the daily nutrition guidelines, which includes 25-35% calories from fat, 7% or less cal from saturated fats, less than 230m cholesterol, less than 1.5gm of sodium, & 5 or more servings of fruits and vegetables daily;Long Term: Adherence to nutrition and physical activity/exercise program aimed toward attainment of established weight goal;Understanding recommendations for meals to include 15-35% energy as protein, 25-35% energy from fat, 35-60% energy from  carbohydrates, less than 269m of dietary cholesterol, 20-35 gm of total fiber daily;Understanding of distribution  of calorie intake throughout the day with the consumption of 4-5 meals/snacks    Improve shortness of breath with ADL's  Yes    Intervention  Provide education, individualized exercise plan and daily activity instruction to help decrease symptoms of SOB with activities of daily living.    Expected Outcomes  Short Term: Improve cardiorespiratory fitness to achieve a reduction of symptoms when performing ADLs;Long Term: Be able to perform more ADLs without symptoms or delay the onset of symptoms       Core Components/Risk Factors/Patient Goals Review:    Core Components/Risk Factors/Patient Goals at Discharge (Final Review):    ITP Comments: ITP Comments    Row Name 08/03/17 1358           ITP Comments  Medical Evaluation completed. Chart sent for review and changes to Dr. MEmily FilbertDirector of [facility] [facility] can be found [facility]          Comments: Initial ITP

## 2017-08-03 NOTE — Patient Instructions (Signed)
Patient Instructions  Patient Details  Name: Antonio [name] MRN: [id number] Date of Birth: [birth year] Referring Provider:  Juanito Doom, MD  Below are your personal goals for exercise, nutrition, and risk factors. Our goal is to help you stay on track towards obtaining and maintaining these goals. We will be discussing your progress on these goals with you throughout the program.  Initial Exercise Prescription: Initial Exercise Prescription - 08/03/17 1500      Date of Initial Exercise RX and Referring Provider   Date  08/03/17    Referring Provider  Simonne Maffucci MD      Treadmill   MPH  2.8    Grade  0.5    Minutes  15    METs  3.34      REL-XR   Level  2    Speed  50    Minutes  15    METs  3      T5 Nustep   Level  2    SPM  80    Minutes  15    METs  3      Prescription Details   Frequency (times per week)  2    Duration  Progress to 45 minutes of aerobic exercise without signs/symptoms of physical distress      Intensity   THRR 40-80% of Max Heartrate  96-129    Ratings of Perceived Exertion  11-13    Perceived Dyspnea  0-4      Progression   Progression  Continue to progress workloads to maintain intensity without signs/symptoms of physical distress.      Resistance Training   Training Prescription  Yes    Weight  4 lbs    Reps  10-15       Exercise Goals: Frequency: Be able to perform aerobic exercise two to three times per week in program working toward 2-5 days per week of home exercise.  Intensity: Work with a perceived exertion of 11 (fairly light) - 15 (hard) while following your exercise prescription.  We will make changes to your prescription with you as you progress through the program.   Duration: Be able to do 30 to 45 minutes of continuous aerobic exercise in addition to a 5 minute warm-up and a 5 minute cool-down routine.   Nutrition Goals: Your personal nutrition goals will be established when you do your nutrition  analysis with the dietician.  The following are general nutrition guidelines to follow: Cholesterol < 263m/day Sodium < 15027mday Fiber: Men over 50 yrs - 30 grams per day  Personal Goals: Personal Goals and Risk Factors at Admission - 08/03/17 1443      Core Components/Risk Factors/Patient Goals on Admission    Weight Management  Yes;Weight Maintenance    Intervention  Weight Management: Develop a combined nutrition and exercise program designed to reach desired caloric intake, while maintaining appropriate intake of nutrient and fiber, sodium and fats, and appropriate energy expenditure required for the weight goal.;Weight Management: Provide education and appropriate resources to help participant work on and attain dietary goals.;Weight Management/Obesity: Establish reasonable short term and long term weight goals.    Admit Weight  144 lb 14.4 oz (65.7 kg)    Goal Weight: Short Term  145 lb (65.8 kg)    Goal Weight: Long Term  145 lb (65.8 kg)    Expected Outcomes  Short Term: Continue to assess and modify interventions until short term weight is achieved;Weight Maintenance: Understanding of the daily nutrition  guidelines, which includes 25-35% calories from fat, 7% or less cal from saturated fats, less than 253m cholesterol, less than 1.5gm of sodium, & 5 or more servings of fruits and vegetables daily;Long Term: Adherence to nutrition and physical activity/exercise program aimed toward attainment of established weight goal;Understanding recommendations for meals to include 15-35% energy as protein, 25-35% energy from fat, 35-60% energy from carbohydrates, less than 2022mof dietary cholesterol, 20-35 gm of total fiber daily;Understanding of distribution of calorie intake throughout the day with the consumption of 4-5 meals/snacks    Improve shortness of breath with ADL's  Yes    Intervention  Provide education, individualized exercise plan and daily activity instruction to help decrease  symptoms of SOB with activities of daily living.    Expected Outcomes  Short Term: Improve cardiorespiratory fitness to achieve a reduction of symptoms when performing ADLs;Long Term: Be able to perform more ADLs without symptoms or delay the onset of symptoms       Tobacco Use Initial Evaluation: Social History   Tobacco Use  Smoking Status Former Smoker  . Packs/day: 0.75  . Years: 35.00  . Pack years: 26.25  . Types: Cigarettes, Cigars  . Last attempt to quit: 06/11/2011  . Years since quitting: 6.1  Smokeless Tobacco Never Used  Tobacco Comment   smoking cessation materials not required    Exercise Goals and Review: Exercise Goals    Row Name 08/03/17 1533             Exercise Goals   Increase Physical Activity  Yes       Intervention  Provide advice, education, support and counseling about physical activity/exercise needs.;Develop an individualized exercise prescription for aerobic and resistive training based on initial evaluation findings, risk stratification, comorbidities and participant's personal goals.       Expected Outcomes  Short Term: Attend rehab on a regular basis to increase amount of physical activity.;Long Term: Add in home exercise to make exercise part of routine and to increase amount of physical activity.;Long Term: Exercising regularly at least 3-5 days a week.       Increase Strength and Stamina  Yes       Intervention  Provide advice, education, support and counseling about physical activity/exercise needs.;Develop an individualized exercise prescription for aerobic and resistive training based on initial evaluation findings, risk stratification, comorbidities and participant's personal goals.       Expected Outcomes  Short Term: Increase workloads from initial exercise prescription for resistance, speed, and METs.;Short Term: Perform resistance training exercises routinely during rehab and add in resistance training at home;Long Term: Improve  cardiorespiratory fitness, muscular endurance and strength as measured by increased METs and functional capacity (6MWT)       Able to understand and use rate of perceived exertion (RPE) scale  Yes       Intervention  Provide education and explanation on how to use RPE scale       Expected Outcomes  Short Term: Able to use RPE daily in rehab to express subjective intensity level;Long Term:  Able to use RPE to guide intensity level when exercising independently       Able to understand and use Dyspnea scale  Yes       Intervention  Provide education and explanation on how to use Dyspnea scale       Expected Outcomes  Short Term: Able to use Dyspnea scale daily in rehab to express subjective sense of shortness of breath during exertion;Long Term: Able to  use Dyspnea scale to guide intensity level when exercising independently       Knowledge and understanding of Target Heart Rate Range (THRR)  Yes       Intervention  Provide education and explanation of THRR including how the numbers were predicted and where they are located for reference       Expected Outcomes  Short Term: Able to state/look up THRR;Short Term: Able to use daily as guideline for intensity in rehab;Long Term: Able to use THRR to govern intensity when exercising independently       Able to check pulse independently  Yes       Intervention  Provide education and demonstration on how to check pulse in carotid and radial arteries.;Review the importance of being able to check your own pulse for safety during independent exercise       Expected Outcomes  Short Term: Able to explain why pulse checking is important during independent exercise;Long Term: Able to check pulse independently and accurately       Understanding of Exercise Prescription  Yes       Intervention  Provide education, explanation, and written materials on patient's individual exercise prescription       Expected Outcomes  Short Term: Able to explain program exercise  prescription;Long Term: Able to explain home exercise prescription to exercise independently          Copy of goals given to participant.

## 2017-08-10 ENCOUNTER — Encounter: Payer: PPO | Attending: Pulmonary Disease

## 2017-08-10 DIAGNOSIS — J449 Chronic obstructive pulmonary disease, unspecified: Secondary | ICD-10-CM | POA: Diagnosis not present

## 2017-08-10 DIAGNOSIS — Z8701 Personal history of pneumonia (recurrent): Secondary | ICD-10-CM | POA: Insufficient documentation

## 2017-08-10 DIAGNOSIS — Z79899 Other long term (current) drug therapy: Secondary | ICD-10-CM | POA: Insufficient documentation

## 2017-08-10 DIAGNOSIS — Z87891 Personal history of nicotine dependence: Secondary | ICD-10-CM | POA: Diagnosis not present

## 2017-08-10 DIAGNOSIS — Z7982 Long term (current) use of aspirin: Secondary | ICD-10-CM | POA: Insufficient documentation

## 2017-08-10 DIAGNOSIS — K5190 Ulcerative colitis, unspecified, without complications: Secondary | ICD-10-CM | POA: Insufficient documentation

## 2017-08-10 DIAGNOSIS — Z7951 Long term (current) use of inhaled steroids: Secondary | ICD-10-CM | POA: Diagnosis not present

## 2017-08-10 NOTE — Progress Notes (Signed)
Pulmonary Individual Treatment Plan  Patient Details  Name: Antonio [name] MRN: [id number] Date of Birth: [birth year] Referring Provider:     Pulmonary Rehab from 08/03/2017 in [facility] Cardiac and Pulmonary Rehab  Referring Provider  Simonne Maffucci MD      Initial Encounter Date:    Pulmonary Rehab from 08/03/2017 in [facility] and Pulmonary Rehab  Date  08/03/17      Visit Diagnosis: Chronic obstructive pulmonary disease, unspecified COPD type ([place])  Patient's Home Medications on Admission:  Current Outpatient Medications:  .  albuterol (PROVENTIL HFA;VENTOLIN HFA) 108 (90 Base) MCG/ACT inhaler, Inhale 2 puffs into the lungs every 6 (six) hours as needed for wheezing or shortness of breath., Disp: 3 Inhaler, Rfl: 3 .  albuterol (PROVENTIL) (2.5 MG/3ML) 0.083% nebulizer solution, Take 3 mLs (2.5 mg total) by nebulization every 6 (six) hours as needed for wheezing or shortness of breath., Disp: 75 mL, Rfl: 12 .  aspirin EC 81 MG tablet, Take 81 mg by mouth daily., Disp: , Rfl:  .  budesonide-formoterol (SYMBICORT) 160-4.5 MCG/ACT inhaler, Inhale 2 puffs into the lungs 2 (two) times daily., Disp: , Rfl:  .  EPINEPHrine 0.3 mg/0.3 mL IJ SOAJ injection, Inject 0.3 mLs (0.3 mg total) into the muscle once., Disp: 1 Device, Rfl: 1 .  fluticasone (FLONASE) 50 MCG/ACT nasal spray, Place 2 sprays into both nostrils daily., Disp: 16 g, Rfl: 6 .  loperamide (IMODIUM) 2 MG capsule, Take 2 mg by mouth daily as needed for diarrhea or loose stools. , Disp: , Rfl:  .  mesalamine (LIALDA) 1.2 g EC tablet, Take 4 tablets (4.8 g total) by mouth daily with breakfast. (Patient taking differently: Take 2.4 g by mouth daily with breakfast. ), Disp: 360 tablet, Rfl: 1 .  omeprazole (PRILOSEC) 20 MG capsule, , Disp: , Rfl:  .  Respiratory Therapy Supplies (FLUTTER) DEVI, Use as directed (Patient not taking: Reported on 07/20/2017), Disp: 1 each, Rfl: 0  Current Facility-Administered Medications:  .   albuterol (PROVENTIL) (2.5 MG/3ML) 0.083% nebulizer solution 2.5 mg, 2.5 mg, Nebulization, Once, [name], MD  Past Medical History: Past Medical History:  Diagnosis Date  . Allergy   . Calf pain   . COPD (chronic obstructive pulmonary disease) ([place])   . COPD (chronic obstructive pulmonary disease) ([place])   . Dyspnea   . Hypokalemia   . Internal hemorrhoids   . Muscle cramp   . Muscle pain   . Pneumonia   . PONV (postoperative nausea and vomiting)   . Tubular adenoma of colon   . Ulcerative colitis ([place])     Tobacco Use: Social History   Tobacco Use  Smoking Status Former Smoker  . Packs/day: 0.75  . Years: 35.00  . Pack years: 26.25  . Types: Cigarettes, Cigars  . Last attempt to quit: 06/11/2011  . Years since quitting: 6.1  Smokeless Tobacco Never Used  Tobacco Comment   smoking cessation materials not required    Labs: Recent Review Flowsheet Data    Labs for ITP Cardiac and Pulmonary Rehab Latest Ref Rng & Units 11/08/2014 12/01/2016   Cholestrol 100 - 199 mg/dL 154 151   LDLCALC 0 - 99 mg/dL 89 91   HDL >39 mg/dL 53 50   Trlycerides 0 - 149 mg/dL 61 51       Pulmonary Assessment Scores: Pulmonary Assessment Scores    Row Name 08/03/17 1435         ADL UCSD   ADL Phase  Entry     SOB Score total  45     Rest  0     Walk  3     Stairs  5     Bath  1     Dress  2     Shop  3       CAT Score   CAT Score  13       mMRC Score   mMRC Score  2        Pulmonary Function Assessment: Pulmonary Function Assessment - 08/03/17 1436      Pulmonary Function Tests   FVC%  59 % test performed on 06/07/14    FEV1%  40 %    FEV1/FVC Ratio  51      Breath   Bilateral Breath Sounds  Clear    Shortness of Breath  Limiting activity;Yes       Exercise Target Goals:    Exercise Program Goal: Individual exercise prescription set using results from initial 6 min walk test and THRR while considering  patient's activity barriers and safety.     Exercise Prescription Goal: Initial exercise prescription builds to 30-45 minutes a day of aerobic activity, 2-3 days per week.  Home exercise guidelines will be given to patient during program as part of exercise prescription that the participant will acknowledge.  Activity Barriers & Risk Stratification: Activity Barriers & Cardiac Risk Stratification - 08/03/17 1531      Activity Barriers & Cardiac Risk Stratification   Activity Barriers  Deconditioning;Shortness of Breath       6 Minute Walk: 6 Minute Walk    Row Name 08/03/17 1528         6 Minute Walk   Phase  Initial     Distance  1515 feet     Walk Time  6 minutes     # of Rest Breaks  0     MPH  2.87     METS  3.44     RPE  12     Perceived Dyspnea   3     VO2 Peak  12.42     Symptoms  Yes (comment)     Comments  SOB, cramp in left calf     Resting HR  63 bpm     Resting BP  134/60     Resting Oxygen Saturation   99 %     Exercise Oxygen Saturation  during 6 min walk  85 %     Max Ex. HR  74 bpm     Max Ex. BP  150/64     2 Minute Post BP  142/60       Interval HR   1 Minute HR  69     2 Minute HR  67     3 Minute HR  68     4 Minute HR  69     5 Minute HR  68     6 Minute HR  84     2 Minute Post HR  75     Interval Heart Rate?  Yes       Interval Oxygen   Interval Oxygen?  Yes     Baseline Oxygen Saturation %  99 %     1 Minute Oxygen Saturation %  95 %     1 Minute Liters of Oxygen  0 L Room Air     2 Minute Oxygen Saturation %  90 %  2 Minute Liters of Oxygen  0 L     3 Minute Oxygen Saturation %  93 %     3 Minute Liters of Oxygen  0 L     4 Minute Oxygen Saturation %  87 %     4 Minute Liters of Oxygen  0 L     5 Minute Oxygen Saturation %  89 %     5 Minute Liters of Oxygen  0 L     6 Minute Oxygen Saturation %  85 %     6 Minute Liters of Oxygen  0 L     2 Minute Post Oxygen Saturation %  97 %     2 Minute Post Liters of Oxygen  0 L       Oxygen Initial Assessment: Oxygen  Initial Assessment - 08/03/17 1439      Home Oxygen   Home Oxygen Device  None    Sleep Oxygen Prescription  None    Home Exercise Oxygen Prescription  None    Home at Rest Exercise Oxygen Prescription  None      Initial 6 min Walk   Oxygen Used  None      Program Oxygen Prescription   Program Oxygen Prescription  None      Intervention   Short Term Goals  To learn and demonstrate proper use of respiratory medications;To learn and demonstrate proper pursed lip breathing techniques or other breathing techniques.;To learn and understand importance of maintaining oxygen saturations>88%;To learn and understand importance of monitoring SPO2 with pulse oximeter and demonstrate accurate use of the pulse oximeter.    Long  Term Goals  Verbalizes importance of monitoring SPO2 with pulse oximeter and return demonstration;Maintenance of O2 saturations>88%;Exhibits proper breathing techniques, such as pursed lip breathing or other method taught during program session;Demonstrates proper use of MDI's;Compliance with respiratory medication       Oxygen Re-Evaluation:   Oxygen Discharge (Final Oxygen Re-Evaluation):   Initial Exercise Prescription: Initial Exercise Prescription - 08/03/17 1500      Date of Initial Exercise RX and Referring Provider   Date  08/03/17    Referring Provider  Simonne Maffucci MD      Treadmill   MPH  2.8    Grade  0.5    Minutes  15    METs  3.34      REL-XR   Level  2    Speed  50    Minutes  15    METs  3      T5 Nustep   Level  2    SPM  80    Minutes  15    METs  3      Prescription Details   Frequency (times per week)  2    Duration  Progress to 45 minutes of aerobic exercise without signs/symptoms of physical distress      Intensity   THRR 40-80% of Max Heartrate  96-129    Ratings of Perceived Exertion  11-13    Perceived Dyspnea  0-4      Progression   Progression  Continue to progress workloads to maintain intensity without  signs/symptoms of physical distress.      Resistance Training   Training Prescription  Yes    Weight  4 lbs    Reps  10-15       Perform Capillary Blood Glucose checks as needed.  Exercise Prescription Changes: Exercise Prescription Changes    Row Name 08/03/17  1500             Response to Exercise   Blood Pressure (Admit)  134/60       Blood Pressure (Exercise)  150/64       Blood Pressure (Exit)  142/60       Heart Rate (Admit)  63 bpm       Heart Rate (Exercise)  84 bpm       Heart Rate (Exit)  75 bpm       Oxygen Saturation (Admit)  99 %       Oxygen Saturation (Exercise)  85 %       Oxygen Saturation (Exit)  97 %       Rating of Perceived Exertion (Exercise)  12       Perceived Dyspnea (Exercise)  3       Symptoms  SOB, cramp in left calf       Comments  walk test results          Exercise Comments:   Exercise Goals and Review: Exercise Goals    Row Name 08/03/17 1533             Exercise Goals   Increase Physical Activity  Yes       Intervention  Provide advice, education, support and counseling about physical activity/exercise needs.;Develop an individualized exercise prescription for aerobic and resistive training based on initial evaluation findings, risk stratification, comorbidities and participant's personal goals.       Expected Outcomes  Short Term: Attend rehab on a regular basis to increase amount of physical activity.;Long Term: Add in home exercise to make exercise part of routine and to increase amount of physical activity.;Long Term: Exercising regularly at least 3-5 days a week.       Increase Strength and Stamina  Yes       Intervention  Provide advice, education, support and counseling about physical activity/exercise needs.;Develop an individualized exercise prescription for aerobic and resistive training based on initial evaluation findings, risk stratification, comorbidities and participant's personal goals.       Expected Outcomes  Short  Term: Increase workloads from initial exercise prescription for resistance, speed, and METs.;Short Term: Perform resistance training exercises routinely during rehab and add in resistance training at home;Long Term: Improve cardiorespiratory fitness, muscular endurance and strength as measured by increased METs and functional capacity (6MWT)       Able to understand and use rate of perceived exertion (RPE) scale  Yes       Intervention  Provide education and explanation on how to use RPE scale       Expected Outcomes  Short Term: Able to use RPE daily in rehab to express subjective intensity level;Long Term:  Able to use RPE to guide intensity level when exercising independently       Able to understand and use Dyspnea scale  Yes       Intervention  Provide education and explanation on how to use Dyspnea scale       Expected Outcomes  Short Term: Able to use Dyspnea scale daily in rehab to express subjective sense of shortness of breath during exertion;Long Term: Able to use Dyspnea scale to guide intensity level when exercising independently       Knowledge and understanding of Target Heart Rate Range (THRR)  Yes       Intervention  Provide education and explanation of THRR including how the numbers were predicted and where they are located for reference  Expected Outcomes  Short Term: Able to state/look up THRR;Short Term: Able to use daily as guideline for intensity in rehab;Long Term: Able to use THRR to govern intensity when exercising independently       Able to check pulse independently  Yes       Intervention  Provide education and demonstration on how to check pulse in carotid and radial arteries.;Review the importance of being able to check your own pulse for safety during independent exercise       Expected Outcomes  Short Term: Able to explain why pulse checking is important during independent exercise;Long Term: Able to check pulse independently and accurately       Understanding of  Exercise Prescription  Yes       Intervention  Provide education, explanation, and written materials on patient's individual exercise prescription       Expected Outcomes  Short Term: Able to explain program exercise prescription;Long Term: Able to explain home exercise prescription to exercise independently          Exercise Goals Re-Evaluation :   Discharge Exercise Prescription (Final Exercise Prescription Changes): Exercise Prescription Changes - 08/03/17 1500      Response to Exercise   Blood Pressure (Admit)  134/60    Blood Pressure (Exercise)  150/64    Blood Pressure (Exit)  142/60    Heart Rate (Admit)  63 bpm    Heart Rate (Exercise)  84 bpm    Heart Rate (Exit)  75 bpm    Oxygen Saturation (Admit)  99 %    Oxygen Saturation (Exercise)  85 %    Oxygen Saturation (Exit)  97 %    Rating of Perceived Exertion (Exercise)  12    Perceived Dyspnea (Exercise)  3    Symptoms  SOB, cramp in left calf    Comments  walk test results       Nutrition:  Target Goals: Understanding of nutrition guidelines, daily intake of sodium <1593m, cholesterol <2069m calories 30% from fat and 7% or less from saturated fats, daily to have 5 or more servings of fruits and vegetables.  Biometrics: Pre Biometrics - 08/03/17 1533      Pre Biometrics   Height  5' 10.9" (1.801 m)    Weight  144 lb 14.4 oz (65.7 kg)    Waist Circumference  36 inches    Hip Circumference  37 inches    Waist to Hip Ratio  0.97 %    BMI (Calculated)  20.26    Single Leg Stand  30 seconds        Nutrition Therapy Plan and Nutrition Goals: Nutrition Therapy & Goals - 08/03/17 1433      Personal Nutrition Goals   Comments  His wife cooks for him and they go out sometimes. He feels like he eats realitively healthy. He would like to meet with the dietician to learn if he is getting enough nutrients.      Intervention Plan   Intervention  Prescribe, educate and counsel regarding individualized specific dietary  modifications aiming towards targeted core components such as weight, hypertension, lipid management, diabetes, heart failure and other comorbidities.    Expected Outcomes  Short Term Goal: Understand basic principles of dietary content, such as calories, fat, sodium, cholesterol and nutrients.;Long Term Goal: Adherence to prescribed nutrition plan.       Nutrition Assessments: Nutrition Assessments - 08/03/17 1432      MEDFICTS Scores   Pre Score  82  Nutrition Goals Re-Evaluation:   Nutrition Goals Discharge (Final Nutrition Goals Re-Evaluation):   Psychosocial: Target Goals: Acknowledge presence or absence of significant depression and/or stress, maximize coping skills, provide positive support system. Participant is able to verbalize types and ability to use techniques and skills needed for reducing stress and depression.   Initial Review & Psychosocial Screening: Initial Psych Review & Screening - 08/03/17 1429      Initial Review   Current issues with  Current Stress Concerns    Source of Stress Concerns  Chronic Illness    Comments  COPD is his main concern with his stress.      Family Dynamics   Good Support System?  Yes    Comments  He can look to his wife, dog and two childeren for support.      Barriers   Psychosocial barriers to participate in program  The patient should benefit from training in stress management and relaxation.      Screening Interventions   Interventions  To provide support and resources with identified psychosocial needs;Encouraged to exercise;Program counselor consult;Provide feedback about the scores to participant    Expected Outcomes  Short Term goal: Utilizing psychosocial counselor, staff and physician to assist with identification of specific Stressors or current issues interfering with healing process. Setting desired goal for each stressor or current issue identified.;Long Term Goal: Stressors or current issues are controlled or  eliminated.;Short Term goal: Identification and review with participant of any Quality of Life or Depression concerns found by scoring the questionnaire.;Long Term goal: The participant improves quality of Life and PHQ9 Scores as seen by post scores and/or verbalization of changes       Quality of Life Scores:  Scores of 19 and below usually indicate a poorer quality of life in these areas.  A difference of  2-3 points is a clinically meaningful difference.  A difference of 2-3 points in the total score of the Quality of Life Index has been associated with significant improvement in overall quality of life, self-image, physical symptoms, and general health in studies assessing change in quality of life.  PHQ-9: Recent Review Flowsheet Data    Depression screen [facility] 2/9 08/03/2017 04/16/2017 04/16/2017 06/17/2016 04/30/2015   Decreased Interest 1 0 0 0 0   Down, Depressed, Hopeless - 0 0 0 0   PHQ - 2 Score 1 0 0 0 0   Altered sleeping 0 0 - - -   Tired, decreased energy 2 0 - - -   Change in appetite 1 0 - - -   Feeling bad or failure about yourself  1 0 - - -   Trouble concentrating 0 0 - - -   Moving slowly or fidgety/restless 0 0 - - -   Suicidal thoughts 0 0 - - -   PHQ-9 Score 5 0 - - -   Difficult doing work/chores Somewhat difficult Not difficult at all - - -     Interpretation of Total Score  Total Score Depression Severity:  1-4 = Minimal depression, 5-9 = Mild depression, 10-14 = Moderate depression, 15-19 = Moderately severe depression, 20-27 = Severe depression   Psychosocial Evaluation and Intervention:   Psychosocial Re-Evaluation:   Psychosocial Discharge (Final Psychosocial Re-Evaluation):   Education: Education Goals: Education classes will be provided on a weekly basis, covering required topics. Participant will state understanding/return demonstration of topics presented.  Learning Barriers/Preferences: Learning Barriers/Preferences - 08/03/17 1438      Learning  Barriers/Preferences  Learning Barriers  Sight prescription do    Learning Preferences  None       Education Topics:  Initial Evaluation Education: - Verbal, written and demonstration of respiratory meds, oximetry and breathing techniques. Instruction on use of nebulizers and MDIs and importance of monitoring MDI activations.   Pulmonary Rehab from 08/03/2017 in [facility] Cardiac and Pulmonary Rehab  Date  08/03/17  Educator  [facility]  Instruction Review Code  1- Verbalizes Understanding      General Nutrition Guidelines/Fats and Fiber: -Group instruction provided by verbal, written material, models and posters to present the general guidelines for heart healthy nutrition. Gives an explanation and review of dietary fats and fiber.   Controlling Sodium/Reading Food Labels: -Group verbal and written material supporting the discussion of sodium use in heart healthy nutrition. Review and explanation with models, verbal and written materials for utilization of the food label.   Exercise Physiology & General Exercise Guidelines: - Group verbal and written instruction with models to review the exercise physiology of the cardiovascular system and associated critical values. Provides general exercise guidelines with specific guidelines to those with heart or lung disease.    Aerobic Exercise & Resistance Training: - Gives group verbal and written instruction on the various components of exercise. Focuses on aerobic and resistive training programs and the benefits of this training and how to safely progress through these programs.   Flexibility, Balance, Mind/Body Relaxation: Provides group verbal/written instruction on the benefits of flexibility and balance training, including mind/body exercise modes such as yoga, pilates and tai chi.  Demonstration and skill practice provided.   Stress and Anxiety: - Provides group verbal and written instruction about the health risks of elevated stress and  causes of high stress.  Discuss the correlation between heart/lung disease and anxiety and treatment options. Review healthy ways to manage with stress and anxiety.   Depression: - Provides group verbal and written instruction on the correlation between heart/lung disease and depressed mood, treatment options, and the stigmas associated with seeking treatment.   Exercise & Equipment Safety: - Individual verbal instruction and demonstration of equipment use and safety with use of the equipment.   Pulmonary Rehab from 08/03/2017 in [facility] Cardiac and Pulmonary Rehab  Date  08/03/17  Educator  [facility]  Instruction Review Code  1- Verbalizes Understanding      Infection Prevention: - Provides verbal and written material to individual with discussion of infection control including proper hand washing and proper equipment cleaning during exercise session.   Pulmonary Rehab from 08/03/2017 in [facility] Cardiac and Pulmonary Rehab  Date  08/03/17  Educator  [facility]  Instruction Review Code  1- Verbalizes Understanding      Falls Prevention: - Provides verbal and written material to individual with discussion of falls prevention and safety.   Pulmonary Rehab from 08/03/2017 in [facility] Cardiac and Pulmonary Rehab  Date  08/03/17  Educator  [facility]  Instruction Review Code  1- Verbalizes Understanding      Diabetes: - Individual verbal and written instruction to review signs/symptoms of diabetes, desired ranges of glucose level fasting, after meals and with exercise. Advice that pre and post exercise glucose checks will be done for 3 sessions at entry of program.   Chronic Lung Diseases: - Group verbal and written instruction to review updates, respiratory medications, advancements in procedures and treatments. Discuss use of supplemental oxygen including available portable oxygen systems, continuous and intermittent flow rates, concentrators, personal use and safety guidelines. Review proper use of inhaler and  spacers. Provide informative websites for self-education.    Energy Conservation: - Provide group verbal and written instruction for methods to conserve energy, plan and organize activities. Instruct on pacing techniques, use of adaptive equipment and posture/positioning to relieve shortness of breath.   Triggers and Exacerbations: - Group verbal and written instruction to review types of environmental triggers and ways to prevent exacerbations. Discuss weather changes, air quality and the benefits of nasal washing. Review warning signs and symptoms to help prevent infections. Discuss techniques for effective airway clearance, coughing, and vibrations.   AED/CPR: - Group verbal and written instruction with the use of models to demonstrate the basic use of the AED with the basic ABC's of resuscitation.   Anatomy and Physiology of the Lungs: - Group verbal and written instruction with the use of models to provide basic lung anatomy and physiology related to function, structure and complications of lung disease.   Anatomy & Physiology of the Heart: - Group verbal and written instruction and models provide basic cardiac anatomy and physiology, with the coronary electrical and arterial systems. Review of Valvular disease and Heart Failure   Cardiac Medications: - Group verbal and written instruction to review commonly prescribed medications for heart disease. Reviews the medication, class of the drug, and side effects.   Know Your Numbers and Risk Factors: -Group verbal and written instruction about important numbers in your health.  Discussion of what are risk factors and how they play a role in the disease process.  Review of Cholesterol, Blood Pressure, Diabetes, and BMI and the role they play in your overall health.   Sleep Hygiene: -Provides group verbal and written instruction about how sleep can affect your health.  Define sleep hygiene, discuss sleep cycles and impact of sleep  habits. Review good sleep hygiene tips.    Other: -Provides group and verbal instruction on various topics (see comments)    Knowledge Questionnaire Score: Knowledge Questionnaire Score - 08/03/17 1436      Knowledge Questionnaire Score   Pre Score  13/18 reviewed with patient        Core Components/Risk Factors/Patient Goals at Admission: Personal Goals and Risk Factors at Admission - 08/03/17 1443      Core Components/Risk Factors/Patient Goals on Admission    Weight Management  Yes;Weight Maintenance    Intervention  Weight Management: Develop a combined nutrition and exercise program designed to reach desired caloric intake, while maintaining appropriate intake of nutrient and fiber, sodium and fats, and appropriate energy expenditure required for the weight goal.;Weight Management: Provide education and appropriate resources to help participant work on and attain dietary goals.;Weight Management/Obesity: Establish reasonable short term and long term weight goals.    Admit Weight  144 lb 14.4 oz (65.7 kg)    Goal Weight: Short Term  145 lb (65.8 kg)    Goal Weight: Long Term  145 lb (65.8 kg)    Expected Outcomes  Short Term: Continue to assess and modify interventions until short term weight is achieved;Weight Maintenance: Understanding of the daily nutrition guidelines, which includes 25-35% calories from fat, 7% or less cal from saturated fats, less than 211m cholesterol, less than 1.5gm of sodium, & 5 or more servings of fruits and vegetables daily;Long Term: Adherence to nutrition and physical activity/exercise program aimed toward attainment of established weight goal;Understanding recommendations for meals to include 15-35% energy as protein, 25-35% energy from fat, 35-60% energy from carbohydrates, less than 2047mof dietary cholesterol, 20-35 gm of total fiber daily;Understanding of distribution  of calorie intake throughout the day with the consumption of 4-5 meals/snacks     Improve shortness of breath with ADL's  Yes    Intervention  Provide education, individualized exercise plan and daily activity instruction to help decrease symptoms of SOB with activities of daily living.    Expected Outcomes  Short Term: Improve cardiorespiratory fitness to achieve a reduction of symptoms when performing ADLs;Long Term: Be able to perform more ADLs without symptoms or delay the onset of symptoms       Core Components/Risk Factors/Patient Goals Review:    Core Components/Risk Factors/Patient Goals at Discharge (Final Review):    ITP Comments: ITP Comments    Row Name 08/03/17 1358 08/10/17 0947         ITP Comments  Medical Evaluation completed. Chart sent for review and changes to Dr. Emily Filbert Director of [place]. Diagnosis can be found in [facility] encounter 06/22/17  30 day review completed. ITP sent to Dr. Emily Filbert Director of [place]. Continue with ITP unless changes are made by physician         Comments: 30 day review

## 2017-08-10 NOTE — Progress Notes (Signed)
Daily Session Note  Patient Details  Name: Antonio [name] MRN: [id number] Date of Birth: 07/03/1945 Referring Provider:     Pulmonary Rehab from 08/03/2017 in [facility] Re[facility]  Referring Provider  McQuaid, Douglas MD      Encounter Date: 08/10/2017  Check In: Session Check In - 08/10/17 1050      Check-In   Location  [facility]    Staff Present    RCP,RRT,BSRT;Laureen Brown, BS, RRT, Respiratory Therapist;Mandi Ballard, BS, PEC    Supervising physician immediately available to respond to emergencies  [facility] immediately available ER MD    Physician(s)  Dr. Kinner and Williams    Medication changes reported      No    Fall or balance concerns reported     No    Warm-up and Cool-down  Performed as group-led instruction    Resistance Training Performed  Yes    VAD Patient?  No      Pain Assessment   Currently in Pain?  No/denies          Social History   Tobacco Use  Smoking Status Former Smoker  . Packs/day: 0.75  . Years: 35.00  . Pack years: 26.25  . Types: Cigarettes, Cigars  . Last attempt to quit: 06/11/2011  . Years since quitting: 6.1  Smokeless Tobacco Never Used  Tobacco Comment   smoking cessation materials not required    Goals Met:  Exercise tolerated well Personal goals reviewed Queuing for purse lip breathing No report of cardiac concerns or symptoms Strength training completed today  Goals Unmet:  Not Applicable  Comments: First full day of exercise!  Patient was oriented to gym and equipment including functions, settings, policies, and procedures.  Patient's individual exercise prescription and treatment plan were reviewed.  All starting workloads were established based on the results of the 6 minute walk test done at initial orientation visit.  The plan for exercise progression was also introduced and progression will be customized based on patient's performance and goals.   Dr. Mark Miller  is Medical Director for [facility] and [facility]. 

## 2017-08-17 DIAGNOSIS — J449 Chronic obstructive pulmonary disease, unspecified: Secondary | ICD-10-CM

## 2017-08-17 NOTE — Progress Notes (Signed)
Daily Session Note  Patient Details  Name: Antonio [name] MRN: [id number] Date of Birth: June 02, 1945 Referring Provider:     Pulmonary Rehab from 08/03/2017 in [facility]  Referring Provider  Simonne Maffucci MD      Encounter Date: 08/17/2017  Check In: Session Check In - 08/17/17 1007      Check-In   Location  [facility]    Staff Present  Justin Mend RCP,RRT,BSRT;Mandi [place], BS, Dennis Bast, BS, ACSM CEP, Exercise Physiologist    Supervising physician immediately available to respond to emergencies  [facility] immediately available ER MD    Physician(s)  Dr. Corky Downs and Cinda Quest    Medication changes reported      No    Fall or balance concerns reported     No    Warm-up and Cool-down  Performed as group-led instruction    Resistance Training Performed  Yes    VAD Patient?  No      Pain Assessment   Currently in Pain?  No/denies          Social History   Tobacco Use  Smoking Status Former Smoker  . Packs/day: 0.75  . Years: 35.00  . Pack years: 26.25  . Types: Cigarettes, Cigars  . Last attempt to quit: 06/11/2011  . Years since quitting: 6.1  Smokeless Tobacco Never Used  Tobacco Comment   smoking cessation materials not required    Goals Met:  Independence with exercise equipment Exercise tolerated well No report of cardiac concerns or symptoms Strength training completed today  Goals Unmet:  Not Applicable  Comments: Pt able to follow exercise prescription today without complaint.  Will continue to monitor for progression.   Dr. Emily Filbert is Medical Director for [place] and [facility].

## 2017-08-24 DIAGNOSIS — D2261 Melanocytic nevi of right upper limb, including shoulder: Secondary | ICD-10-CM | POA: Diagnosis not present

## 2017-08-24 DIAGNOSIS — J449 Chronic obstructive pulmonary disease, unspecified: Secondary | ICD-10-CM

## 2017-08-24 DIAGNOSIS — X32XXXA Exposure to sunlight, initial encounter: Secondary | ICD-10-CM | POA: Diagnosis not present

## 2017-08-24 DIAGNOSIS — D2271 Melanocytic nevi of right lower limb, including hip: Secondary | ICD-10-CM | POA: Diagnosis not present

## 2017-08-24 DIAGNOSIS — D2262 Melanocytic nevi of left upper limb, including shoulder: Secondary | ICD-10-CM | POA: Diagnosis not present

## 2017-08-24 DIAGNOSIS — D2272 Melanocytic nevi of left lower limb, including hip: Secondary | ICD-10-CM | POA: Diagnosis not present

## 2017-08-24 DIAGNOSIS — L218 Other seborrheic dermatitis: Secondary | ICD-10-CM | POA: Diagnosis not present

## 2017-08-24 DIAGNOSIS — D225 Melanocytic nevi of trunk: Secondary | ICD-10-CM | POA: Diagnosis not present

## 2017-08-24 DIAGNOSIS — L570 Actinic keratosis: Secondary | ICD-10-CM | POA: Diagnosis not present

## 2017-08-24 NOTE — Progress Notes (Signed)
Daily Session Note  Patient Details  Name: Antonio [name] MRN: [id number] Date of Birth: Apr 22, 1945 Referring Provider:     Pulmonary Rehab from 08/03/2017 in [facility] Re[facility]  Referring Provider  Simonne Maffucci MD      Encounter Date: 08/24/2017  Check In: Session Check In - 08/24/17 1016      Check-In   Location  [facility]    Staff Present  Justin Mend RCP,RRT,BSRT;Amanda Oletta Darter, BA, ACSM CEP, Exercise Physiologist;Kelly Amedeo Plenty, BS, ACSM CEP, Exercise Physiologist    Supervising physician immediately available to respond to emergencies  [facility] immediately available ER MD    Physician(s)  Dr. Jimmye Norman and Corky Downs    Medication changes reported      No    Fall or balance concerns reported     No    Warm-up and Cool-down  Performed as group-led instruction    Resistance Training Performed  Yes    VAD Patient?  No      Pain Assessment   Currently in Pain?  No/denies          Social History   Tobacco Use  Smoking Status Former Smoker  . Packs/day: 0.75  . Years: 35.00  . Pack years: 26.25  . Types: Cigarettes, Cigars  . Last attempt to quit: 06/11/2011  . Years since quitting: 6.2  Smokeless Tobacco Never Used  Tobacco Comment   smoking cessation materials not required    Goals Met:  Independence with exercise equipment Exercise tolerated well No report of cardiac concerns or symptoms Strength training completed today  Goals Unmet:  Not Applicable  Comments: Pt able to follow exercise prescription today without complaint.  Will continue to monitor for progression.   Dr. Emily Filbert is Medical Director for [place] and [facility].

## 2017-08-25 ENCOUNTER — Encounter: Payer: Self-pay | Admitting: Pulmonary Disease

## 2017-08-25 ENCOUNTER — Ambulatory Visit: Payer: PPO | Admitting: Pulmonary Disease

## 2017-08-25 VITALS — BP 120/64 | HR 77 | Ht 71.0 in | Wt 146.0 lb

## 2017-08-25 DIAGNOSIS — J449 Chronic obstructive pulmonary disease, unspecified: Secondary | ICD-10-CM

## 2017-08-25 MED ORDER — TIOTROPIUM BROMIDE MONOHYDRATE 2.5 MCG/ACT IN AERS: 2.0000 | INHALATION_SPRAY | Freq: Every day | RESPIRATORY_TRACT | 0 refills | Status: DC

## 2017-08-25 NOTE — Patient Instructions (Signed)
Severe COPD: Continue Symbicort 2 puffs twice a day no matter how you feel Be sure to rinse her mouth after any inhaled medicine Start taking the sample of Spiriva 2 puffs daily no matter how you feel Call me after you have use the Spiriva and to let me know if it is helping your breathing Continue to use albuterol as needed for chest tightness wheezing or shortness of breath Continue to participate in pulmonary rehab Practice good hand hygiene Get a flu shot in the fall  Follow-up in 4 weeks, however if you are feeling better call and let me know and we can change the appointment.

## 2017-08-25 NOTE — Progress Notes (Signed)
Subjective:    Patient ID: Antonio [name], male    DOB: 1945-10-24, 72 y.o.   MRN: [id number]  Synopsis: He was referred for shortness of breath in 2016. He had a lengthy smoking history but quit smoking in 2014. April 2016 pulmonary function testing confirmed COPD which was severe with an FEV1 of 1.47 L (40% predicted).   HPI Chief Complaint  Patient presents with  . Follow-up    shortness of breath   Antonio [name] says he can't complain.  Some days are better than others.  The temperature and humidity make his symptoms worse.  He is participating in pulmonary rehab and he says that he is capable of doing continuous cardiovascular exercise for at least 15 minutes at a time.  He says that he actually was able to weed eat his yard a few weeks ago.  However, his wife is concerned that his shortness of breath is not r return to baseline.  He denies any problems with cough or mucus production right now.  He is compliant with his Symbicort 2 puffs twice a day no matter how he feels.  He uses an albuterol nebulizer throughout the course the day, sometimes 2-3 times a day depending on his symptoms.  He denies chest pain, leg swelling, or bleeding.  Past Medical History:  Diagnosis Date  . Allergy   . Calf pain   . COPD (chronic obstructive pulmonary disease) ([place])   . COPD (chronic obstructive pulmonary disease) ([place])   . Dyspnea   . Hypokalemia   . Internal hemorrhoids   . Muscle cramp   . Muscle pain   . Pneumonia   . PONV (postoperative nausea and vomiting)   . Tubular adenoma of colon   . Ulcerative colitis ([place])       Review of Systems  Constitutional: Negative for chills, fatigue and fever.  HENT: Negative for postnasal drip, rhinorrhea and sinus pain.   Respiratory: Positive for shortness of breath. Negative for cough and wheezing.   Cardiovascular: Negative for chest pain, palpitations and leg swelling.       Objective:   Physical Exam  Vitals:   08/25/17 0942  BP:  120/64  Pulse: 77  SpO2: 99%  Weight: 146 lb (66.2 kg)  Height: 5' 11"  (1.803 m)   RA  Gen: well appearing HENT: OP clear, TM's clear, neck supple PULM: CTA B, normal percussion CV: RRR, no mgr, trace edema GI: BS+, soft, nontender Derm: no cyanosis or rash Psyche: normal mood and affect   CBC    Component Value Date/Time   WBC 10.0 05/28/2016 1450   RBC 5.01 05/28/2016 1450   HGB 15.3 05/28/2016 1450   HGB 15.2 11/26/2015 0933   HCT 45.6 05/28/2016 1450   HCT 43.9 11/26/2015 0933   PLT 277.0 05/28/2016 1450   PLT 247 11/26/2015 0933   MCV 91.1 05/28/2016 1450   MCV 90 11/26/2015 0933   MCV 95 10/01/2013 2131   MCH 31.0 04/28/2016 1433   MCHC 33.6 05/28/2016 1450   RDW 13.1 05/28/2016 1450   RDW 13.6 11/26/2015 0933   RDW 12.4 10/01/2013 2131   LYMPHSABS 2.1 05/28/2016 1450   MONOABS 1.4 (H) 05/28/2016 1450   EOSABS 0.7 05/28/2016 1450   BASOSABS 0.1 05/28/2016 1450   PFT December 2017 lung function testing showed ratio 50%, FEV1 1.47 L 43% predicted, FVC 2.96 L 67% predicted       Assessment & Plan:   Centrilobular Emphysema  Discussion He still  complains of shortness of breath with that think is due to the fact that he said several exacerbations this year and he has severe airflow obstruction.  Before looking into other causes of shortness of breath I think we need to treat him with maximum bronchodilator therapy to see if he has symptomatic improvement.  If he does not then we may need to repeat lung function testing and chest imaging to assess further.  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  Plan: Severe COPD: Continue Symbicort 2 puffs twice a day no matter how you feel Be sure to rinse her mouth after any inhaled medicine Start taking the sample of Spiriva 2 puffs daily no matter how you feel Call me after you  have use the Spiriva and to let me know if it is helping your breathing Continue to use albuterol as needed for chest tightness wheezing or shortness of breath Continue to participate in pulmonary rehab Practice good hand hygiene Get a flu shot in the fall  Follow-up in 4 weeks, however if you are feeling better call and let me know and we can change the appointment.  Greater than 50% of this 25-minute visit spent face to face     Current Outpatient Medications:  .  albuterol (PROVENTIL HFA;VENTOLIN HFA) 108 (90 Base) MCG/ACT inhaler, Inhale 2 puffs into the lungs every 6 (six) hours as needed for wheezing or shortness of breath., Disp: 3 Inhaler, Rfl: 3 .  albuterol (PROVENTIL) (2.5 MG/3ML) 0.083% nebulizer solution, Take 3 mLs (2.5 mg total) by nebulization every 6 (six) hours as needed for wheezing or shortness of breath., Disp: 75 mL, Rfl: 12 .  aspirin EC 81 MG tablet, Take 81 mg by mouth daily., Disp: , Rfl:  .  budesonide-formoterol (SYMBICORT) 160-4.5 MCG/ACT inhaler, Inhale 2 puffs into the lungs 2 (two) times daily., Disp: , Rfl:  .  EPINEPHrine 0.3 mg/0.3 mL IJ SOAJ injection, Inject 0.3 mLs (0.3 mg total) into the muscle once., Disp: 1 Device, Rfl: 1 .  fluticasone (FLONASE) 50 MCG/ACT nasal spray, Place 2 sprays into both nostrils daily., Disp: 16 g, Rfl: 6 .  loperamide (IMODIUM) 2 MG capsule, Take 2 mg by mouth daily as needed for diarrhea or loose stools. , Disp: , Rfl:  .  mesalamine (LIALDA) 1.2 g EC tablet, Take 4 tablets (4.8 g total) by mouth daily with breakfast. (Patient taking differently: Take 2.4 g by mouth daily with breakfast. ), Disp: 360 tablet, Rfl: 1 .  omeprazole (PRILOSEC) 20 MG capsule, , Disp: , Rfl:  .  Respiratory Therapy Supplies (FLUTTER) DEVI, Use as directed, Disp: 1 each, Rfl: 0 .  Tiotropium Bromide Monohydrate (SPIRIVA RESPIMAT) 2.5 MCG/ACT AERS, Inhale 2 puffs into the lungs daily., Disp: 1 Inhaler, Rfl: 0  Current Facility-Administered  Medications:  .  albuterol (PROVENTIL) (2.5 MG/3ML) 0.083% nebulizer solution 2.5 mg, 2.5 mg, Nebulization, Once, [name], MD

## 2017-08-28 DIAGNOSIS — J449 Chronic obstructive pulmonary disease, unspecified: Secondary | ICD-10-CM

## 2017-08-28 NOTE — Progress Notes (Signed)
Daily Session Note  Patient Details  Name: Antonio [name] MRN: [id number] Date of Birth: 1945-09-15 Referring Provider:     Pulmonary Rehab from 08/03/2017 in [facility] Pulmonary Rehab  Referring Provider  Simonne Maffucci MD      Encounter Date: 08/28/2017  Check In: Session Check In - 08/28/17 1013      Check-In   Location  [facility]    Staff Present  Justin Mend Lindell Spar, BA, ACSM CEP, Exercise Physiologist;Meredith Sherryll Burger, RN BSN    Supervising physician immediately available to respond to emergencies  [facility] immediately available ER MD    Physician(s)  Dr. Mariea Clonts and Burlene Arnt    Medication changes reported      No    Fall or balance concerns reported     No    Warm-up and Cool-down  Performed as group-led instruction    Resistance Training Performed  Yes    VAD Patient?  No      Pain Assessment   Currently in Pain?  No/denies          Social History   Tobacco Use  Smoking Status Former Smoker  . Packs/day: 0.75  . Years: 35.00  . Pack years: 26.25  . Types: Cigarettes, Cigars  . Last attempt to quit: 06/11/2011  . Years since quitting: 6.2  Smokeless Tobacco Never Used  Tobacco Comment   smoking cessation materials not required    Goals Met:  Independence with exercise equipment Exercise tolerated well No report of cardiac concerns or symptoms Strength training completed today  Goals Unmet:  Not Applicable  Comments: Pt able to follow exercise prescription today without complaint.  Will continue to monitor for progression.   Dr. Emily Filbert is Medical Director for [place] and [facility].

## 2017-08-31 DIAGNOSIS — J449 Chronic obstructive pulmonary disease, unspecified: Secondary | ICD-10-CM

## 2017-08-31 NOTE — Progress Notes (Signed)
Daily Session Note  Patient Details  Name: Tonnie Friedel MRN: [id number] Date of Birth: 1945/06/08 Referring Provider:     Pulmonary Rehab from 08/03/2017 in [facility] and Pulmonary Rehab  Referring Provider  Simonne Maffucci MD      Encounter Date: 08/31/2017  Check In: Session Check In - 08/31/17 1029      Check-In   Location  [facility]    Staff Present  Justin Mend Jaci Carrel, BS, ACSM CEP, Exercise Physiologist;Amanda Oletta Darter, [place], ACSM CEP, Exercise Physiologist    Supervising physician immediately available to respond to emergencies  [facility] immediately available ER MD    Physician(s)  Dr. Cinda Quest and Dr. Archie Balboa    Medication changes reported      No    Fall or balance concerns reported     No    Tobacco Cessation  No Change    Warm-up and Cool-down  Performed as group-led instruction    Resistance Training Performed  Yes    VAD Patient?  No    PAD/SET Patient?  No      Pain Assessment   Currently in Pain?  No/denies    Multiple Pain Sites  No          Social History   Tobacco Use  Smoking Status Former Smoker  . Packs/day: 0.75  . Years: 35.00  . Pack years: 26.25  . Types: Cigarettes, Cigars  . Last attempt to quit: 06/11/2011  . Years since quitting: 6.2  Smokeless Tobacco Never Used  Tobacco Comment   smoking cessation materials not required    Goals Met:  Proper associated with RPD/PD & O2 Sat Independence with exercise equipment Exercise tolerated well No report of cardiac concerns or symptoms Strength training completed today  Goals Unmet:  Not Applicable  Comments: Pt able to follow exercise prescription today without complaint.  Will continue to monitor for progression.    Dr. Emily Filbert is Medical Director for [place] and [facility].

## 2017-09-07 ENCOUNTER — Encounter: Payer: PPO | Admitting: *Deleted

## 2017-09-07 DIAGNOSIS — J449 Chronic obstructive pulmonary disease, unspecified: Secondary | ICD-10-CM

## 2017-09-07 NOTE — Progress Notes (Signed)
Daily Session Note  Patient Details  Name: Vanderbilt Ranieri MRN: [id number] Date of Birth: [birth year] Referring Provider:     Pulmonary Rehab from 08/03/2017 in [facility] Pulmonary Rehab  Referring Provider  Simonne Maffucci MD      Encounter Date: 09/07/2017  Check In: Session Check In - 09/07/17 1019      Check-In   Supervising physician immediately available to respond to emergencies  [facility] immediately available ER MD    Location  [facility]    Staff Present  Justin Mend Jaci Carrel, BS, ACSM CEP, Exercise Physiologist;Amanda Oletta Darter, [place], ACSM CEP, Exercise Physiologist    Medication changes reported      No    Fall or balance concerns reported     No    Tobacco Cessation  No Change    Warm-up and Cool-down  Performed as group-led instruction    Resistance Training Performed  Yes    VAD Patient?  No    PAD/SET Patient?  No      Pain Assessment   Currently in Pain?  No/denies    Multiple Pain Sites  No          Social History   Tobacco Use  Smoking Status Former Smoker  . Packs/day: 0.75  . Years: 35.00  . Pack years: 26.25  . Types: Cigarettes, Cigars  . Last attempt to quit: 06/11/2011  . Years since quitting: 6.2  Smokeless Tobacco Never Used  Tobacco Comment   smoking cessation materials not required    Goals Met:  Proper associated with RPD/PD & O2 Sat Independence with exercise equipment Exercise tolerated well No report of cardiac concerns or symptoms Strength training completed today  Goals Unmet:  Not Applicable  Comments: Pt able to follow exercise prescription today without complaint.  Will continue to monitor for progression.    Dr. Emily Filbert is Medical Director for [place] and [facility].

## 2017-09-07 NOTE — Progress Notes (Signed)
Pulmonary Individual Treatment Plan  Patient Details  Name: Kayzen Kendzierski MRN: [id number] Date of Birth: [birth year] Referring Provider:     Pulmonary Rehab from 08/03/2017 in [facility] Cardiac and Pulmonary Rehab  Referring Provider  Simonne Maffucci MD      Initial Encounter Date:    Pulmonary Rehab from 08/03/2017 in [facility] Cardiac and Pulmonary Rehab  Date  08/03/17      Visit Diagnosis: Chronic obstructive pulmonary disease, unspecified COPD type ([place])  Patient's Home Medications on Admission:  Current Outpatient Medications:  .  albuterol (PROVENTIL HFA;VENTOLIN HFA) 108 (90 Base) MCG/ACT inhaler, Inhale 2 puffs into the lungs every 6 (six) hours as needed for wheezing or shortness of breath., Disp: 3 Inhaler, Rfl: 3 .  albuterol (PROVENTIL) (2.5 MG/3ML) 0.083% nebulizer solution, Take 3 mLs (2.5 mg total) by nebulization every 6 (six) hours as needed for wheezing or shortness of breath., Disp: 75 mL, Rfl: 12 .  aspirin EC 81 MG tablet, Take 81 mg by mouth daily., Disp: , Rfl:  .  budesonide-formoterol (SYMBICORT) 160-4.5 MCG/ACT inhaler, Inhale 2 puffs into the lungs 2 (two) times daily., Disp: , Rfl:  .  EPINEPHrine 0.3 mg/0.3 mL IJ SOAJ injection, Inject 0.3 mLs (0.3 mg total) into the muscle once., Disp: 1 Device, Rfl: 1 .  fluticasone (FLONASE) 50 MCG/ACT nasal spray, Place 2 sprays into both nostrils daily., Disp: 16 g, Rfl: 6 .  loperamide (IMODIUM) 2 MG capsule, Take 2 mg by mouth daily as needed for diarrhea or loose stools. , Disp: , Rfl:  .  mesalamine (LIALDA) 1.2 g EC tablet, Take 4 tablets (4.8 g total) by mouth daily with breakfast. (Patient taking differently: Take 2.4 g by mouth daily with breakfast. ), Disp: 360 tablet, Rfl: 1 .  omeprazole (PRILOSEC) 20 MG capsule, , Disp: , Rfl:  .  Respiratory Therapy Supplies (FLUTTER) DEVI, Use as directed, Disp: 1 each, Rfl: 0 .  Tiotropium Bromide Monohydrate (SPIRIVA RESPIMAT) 2.5 MCG/ACT AERS, Inhale 2 puffs into the  lungs daily., Disp: 1 Inhaler, Rfl: 0  Current Facility-Administered Medications:  .  albuterol (PROVENTIL) (2.5 MG/3ML) 0.083% nebulizer solution 2.5 mg, 2.5 mg, Nebulization, Once, [name], MD  Past Medical History: Past Medical History:  Diagnosis Date  . Allergy   . Calf pain   . COPD (chronic obstructive pulmonary disease) ([place])   . Dyspnea   . Hypokalemia   . Internal hemorrhoids   . Muscle cramp   . Muscle pain   . Pneumonia   . PONV (postoperative nausea and vomiting)   . Tubular adenoma of colon   . Ulcerative colitis ([place])     Tobacco Use: Social History   Tobacco Use  Smoking Status Former Smoker  . Packs/day: 0.75  . Years: 35.00  . Pack years: 26.25  . Types: Cigarettes, Cigars  . Last attempt to quit: 06/11/2011  . Years since quitting: 6.2  Smokeless Tobacco Never Used  Tobacco Comment   smoking cessation materials not required    Labs: Recent Review Flowsheet Data    Labs for ITP Cardiac and Pulmonary Rehab Latest Ref Rng & Units 11/08/2014 12/01/2016   Cholestrol 100 - 199 mg/dL 154 151   LDLCALC 0 - 99 mg/dL 89 91   HDL >39 mg/dL 53 50   Trlycerides 0 - 149 mg/dL 61 51       Pulmonary Assessment Scores: Pulmonary Assessment Scores    Row Name 08/03/17 1435  ADL UCSD   ADL Phase  Entry     SOB Score total  45     Rest  0     Walk  3     Stairs  5     Bath  1     Dress  2     Shop  3       CAT Score   CAT Score  13       mMRC Score   mMRC Score  2        Pulmonary Function Assessment: Pulmonary Function Assessment - 08/03/17 1436      Pulmonary Function Tests   FVC%  59 % test performed on 06/07/14    FEV1%  40 %    FEV1/FVC Ratio  51      Breath   Bilateral Breath Sounds  Clear    Shortness of Breath  Limiting activity;Yes       Exercise Target Goals:    Exercise Program Goal: Individual exercise prescription set using results from initial 6 min walk test and THRR while considering  patient's  activity barriers and safety.    Exercise Prescription Goal: Initial exercise prescription builds to 30-45 minutes a day of aerobic activity, 2-3 days per week.  Home exercise guidelines will be given to patient during program as part of exercise prescription that the participant will acknowledge.  Activity Barriers & Risk Stratification: Activity Barriers & Cardiac Risk Stratification - 08/03/17 1531      Activity Barriers & Cardiac Risk Stratification   Activity Barriers  Deconditioning;Shortness of Breath       6 Minute Walk: 6 Minute Walk    Row Name 08/03/17 1528         6 Minute Walk   Phase  Initial     Distance  1515 feet     Walk Time  6 minutes     # of Rest Breaks  0     MPH  2.87     METS  3.44     RPE  12     Perceived Dyspnea   3     VO2 Peak  12.42     Symptoms  Yes (comment)     Comments  SOB, cramp in left calf     Resting HR  63 bpm     Resting BP  134/60     Resting Oxygen Saturation   99 %     Exercise Oxygen Saturation  during 6 min walk  85 %     Max Ex. HR  74 bpm     Max Ex. BP  150/64     2 Minute Post BP  142/60       Interval HR   1 Minute HR  69     2 Minute HR  67     3 Minute HR  68     4 Minute HR  69     5 Minute HR  68     6 Minute HR  84     2 Minute Post HR  75     Interval Heart Rate?  Yes       Interval Oxygen   Interval Oxygen?  Yes     Baseline Oxygen Saturation %  99 %     1 Minute Oxygen Saturation %  95 %     1 Minute Liters of Oxygen  0 L Room Air     2 Minute Oxygen  Saturation %  90 %     2 Minute Liters of Oxygen  0 L     3 Minute Oxygen Saturation %  93 %     3 Minute Liters of Oxygen  0 L     4 Minute Oxygen Saturation %  87 %     4 Minute Liters of Oxygen  0 L     5 Minute Oxygen Saturation %  89 %     5 Minute Liters of Oxygen  0 L     6 Minute Oxygen Saturation %  85 %     6 Minute Liters of Oxygen  0 L     2 Minute Post Oxygen Saturation %  97 %     2 Minute Post Liters of Oxygen  0 L       Oxygen  Initial Assessment: Oxygen Initial Assessment - 08/03/17 1439      Home Oxygen   Home Oxygen Device  None    Sleep Oxygen Prescription  None    Home Exercise Oxygen Prescription  None    Home at Rest Exercise Oxygen Prescription  None      Initial 6 min Walk   Oxygen Used  None      Program Oxygen Prescription   Program Oxygen Prescription  None      Intervention   Short Term Goals  To learn and demonstrate proper use of respiratory medications;To learn and demonstrate proper pursed lip breathing techniques or other breathing techniques.;To learn and understand importance of maintaining oxygen saturations>88%;To learn and understand importance of monitoring SPO2 with pulse oximeter and demonstrate accurate use of the pulse oximeter.    Long  Term Goals  Verbalizes importance of monitoring SPO2 with pulse oximeter and return demonstration;Maintenance of O2 saturations>88%;Exhibits proper breathing techniques, such as pursed lip breathing or other method taught during program session;Demonstrates proper use of MDI's;Compliance with respiratory medication       Oxygen Re-Evaluation: Oxygen Re-Evaluation    Row Name 08/10/17 1055 08/28/17 1041           Program Oxygen Prescription   Program Oxygen Prescription  None  None        Home Oxygen   Home Oxygen Device  None  None      Sleep Oxygen Prescription  None  None      Home Exercise Oxygen Prescription  None  None      Home at Rest Exercise Oxygen Prescription  None  None        Goals/Expected Outcomes   Short Term Goals  To learn and demonstrate proper use of respiratory medications;To learn and demonstrate proper pursed lip breathing techniques or other breathing techniques.;To learn and understand importance of maintaining oxygen saturations>88%;To learn and understand importance of monitoring SPO2 with pulse oximeter and demonstrate accurate use of the pulse oximeter.  To learn and demonstrate proper use of respiratory  medications;To learn and demonstrate proper pursed lip breathing techniques or other breathing techniques.;To learn and understand importance of maintaining oxygen saturations>88%;To learn and understand importance of monitoring SPO2 with pulse oximeter and demonstrate accurate use of the pulse oximeter.      Long  Term Goals  Verbalizes importance of monitoring SPO2 with pulse oximeter and return demonstration;Maintenance of O2 saturations>88%;Exhibits proper breathing techniques, such as pursed lip breathing or other method taught during program session;Demonstrates proper use of MDI's;Compliance with respiratory medication  Verbalizes importance of monitoring SPO2 with pulse oximeter and return demonstration;Maintenance of O2  saturations>88%;Exhibits proper breathing techniques, such as pursed lip breathing or other method taught during program session;Demonstrates proper use of MDI's;Compliance with respiratory medication      Comments  Reviewed PLB technique with pt.  Talked about how it work and it's important to maintaining his exercise saturations.    Fount is going well with his PLB and checking his oxygen at home. One thing he can do more is check his oxygen while he is doing something strenuous. He has been in the low 90s range with his oxygen while exercising.      Goals/Expected Outcomes  Short: Become more profiecient at using PLB.   Long: Become independent at using PLB.  Short: monitor oxygen at home with exertion. Long: maintain oxygen saturations above 88 percent independently.         Oxygen Discharge (Final Oxygen Re-Evaluation): Oxygen Re-Evaluation - 08/28/17 1041      Program Oxygen Prescription   Program Oxygen Prescription  None      Home Oxygen   Home Oxygen Device  None    Sleep Oxygen Prescription  None    Home Exercise Oxygen Prescription  None    Home at Rest Exercise Oxygen Prescription  None      Goals/Expected Outcomes   Short Term Goals  To learn and demonstrate  proper use of respiratory medications;To learn and demonstrate proper pursed lip breathing techniques or other breathing techniques.;To learn and understand importance of maintaining oxygen saturations>88%;To learn and understand importance of monitoring SPO2 with pulse oximeter and demonstrate accurate use of the pulse oximeter.    Long  Term Goals  Verbalizes importance of monitoring SPO2 with pulse oximeter and return demonstration;Maintenance of O2 saturations>88%;Exhibits proper breathing techniques, such as pursed lip breathing or other method taught during program session;Demonstrates proper use of MDI's;Compliance with respiratory medication    Comments  Crockett is going well with his PLB and checking his oxygen at home. One thing he can do more is check his oxygen while he is doing something strenuous. He has been in the low 90s range with his oxygen while exercising.    Goals/Expected Outcomes  Short: monitor oxygen at home with exertion. Long: maintain oxygen saturations above 88 percent independently.       Initial Exercise Prescription: Initial Exercise Prescription - 08/03/17 1500      Date of Initial Exercise RX and Referring Provider   Date  08/03/17    Referring Provider  Simonne Maffucci MD      Treadmill   MPH  2.8    Grade  0.5    Minutes  15    METs  3.34      REL-XR   Level  2    Speed  50    Minutes  15    METs  3      T5 Nustep   Level  2    SPM  80    Minutes  15    METs  3      Prescription Details   Frequency (times per week)  2    Duration  Progress to 45 minutes of aerobic exercise without signs/symptoms of physical distress      Intensity   THRR 40-80% of Max Heartrate  96-129    Ratings of Perceived Exertion  11-13    Perceived Dyspnea  0-4      Progression   Progression  Continue to progress workloads to maintain intensity without signs/symptoms of physical distress.  Resistance Training   Training Prescription  Yes    Weight  4 lbs     Reps  10-15       Perform Capillary Blood Glucose checks as needed.  Exercise Prescription Changes: Exercise Prescription Changes    Row Name 08/03/17 1500 08/12/17 1400 08/26/17 1500         Response to Exercise   Blood Pressure (Admit)  134/60  124/60  134/74     Blood Pressure (Exercise)  150/64  142/64  -     Blood Pressure (Exit)  142/60  118/64  126/64     Heart Rate (Admit)  63 bpm  63 bpm  73 bpm     Heart Rate (Exercise)  84 bpm  88 bpm  95 bpm     Heart Rate (Exit)  75 bpm  75 bpm  65 bpm     Oxygen Saturation (Admit)  99 %  95 %  95 %     Oxygen Saturation (Exercise)  85 %  90 %  95 %     Oxygen Saturation (Exit)  97 %  97 %  90 %     Rating of Perceived Exertion (Exercise)  12  13  12      Perceived Dyspnea (Exercise)  3  3  3      Symptoms  SOB, cramp in left calf  SOB  -     Comments  walk test results  first full day of exercise  -     Duration  -  Progress to 45 minutes of aerobic exercise without signs/symptoms of physical distress  Progress to 45 minutes of aerobic exercise without signs/symptoms of physical distress     Intensity  -  THRR unchanged  THRR unchanged       Progression   Progression  -  Continue to progress workloads to maintain intensity without signs/symptoms of physical distress.  Continue to progress workloads to maintain intensity without signs/symptoms of physical distress.     Average METs  -  3.58  2.4       Resistance Training   Training Prescription  -  Yes  Yes     Weight  -  4 lbs  4 lb     Reps  -  10-15  10-15       Interval Training   Interval Training  -  No  -       Treadmill   MPH  -  2.8  2.8     Grade  -  0.5  0.5     Minutes  -  15  15     METs  -  3.34  3.34       REL-XR   Level  -  2  -     Speed  -  77  -     Minutes  -  15  -     METs  -  5.5  -       T5 Nustep   Level  -  2  2     SPM  -  69  80     Minutes  -  15  15     METs  -  1.9  1.9        Exercise Comments: Exercise Comments    Row Name  08/10/17 1055           Exercise Comments  First full day of exercise!  Patient  was oriented to gym and equipment including functions, settings, policies, and procedures.  Patient's individual exercise prescription and treatment plan were reviewed.  All starting workloads were established based on the results of the 6 minute walk test done at initial orientation visit.  The plan for exercise progression was also introduced and progression will be customized based on patient's performance and goals.          Exercise Goals and Review: Exercise Goals    Row Name 08/03/17 1533             Exercise Goals   Increase Physical Activity  Yes       Intervention  Provide advice, education, support and counseling about physical activity/exercise needs.;Develop an individualized exercise prescription for aerobic and resistive training based on initial evaluation findings, risk stratification, comorbidities and participant's personal goals.       Expected Outcomes  Short Term: Attend rehab on a regular basis to increase amount of physical activity.;Long Term: Add in home exercise to make exercise part of routine and to increase amount of physical activity.;Long Term: Exercising regularly at least 3-5 days a week.       Increase Strength and Stamina  Yes       Intervention  Provide advice, education, support and counseling about physical activity/exercise needs.;Develop an individualized exercise prescription for aerobic and resistive training based on initial evaluation findings, risk stratification, comorbidities and participant's personal goals.       Expected Outcomes  Short Term: Increase workloads from initial exercise prescription for resistance, speed, and METs.;Short Term: Perform resistance training exercises routinely during rehab and add in resistance training at home;Long Term: Improve cardiorespiratory fitness, muscular endurance and strength as measured by increased METs and functional  capacity (6MWT)       Able to understand and use rate of perceived exertion (RPE) scale  Yes       Intervention  Provide education and explanation on how to use RPE scale       Expected Outcomes  Short Term: Able to use RPE daily in rehab to express subjective intensity level;Long Term:  Able to use RPE to guide intensity level when exercising independently       Able to understand and use Dyspnea scale  Yes       Intervention  Provide education and explanation on how to use Dyspnea scale       Expected Outcomes  Short Term: Able to use Dyspnea scale daily in rehab to express subjective sense of shortness of breath during exertion;Long Term: Able to use Dyspnea scale to guide intensity level when exercising independently       Knowledge and understanding of Target Heart Rate Range (THRR)  Yes       Intervention  Provide education and explanation of THRR including how the numbers were predicted and where they are located for reference       Expected Outcomes  Short Term: Able to state/look up THRR;Short Term: Able to use daily as guideline for intensity in rehab;Long Term: Able to use THRR to govern intensity when exercising independently       Able to check pulse independently  Yes       Intervention  Provide education and demonstration on how to check pulse in carotid and radial arteries.;Review the importance of being able to check your own pulse for safety during independent exercise       Expected Outcomes  Short Term: Able to explain why pulse checking is important  during independent exercise;Long Term: Able to check pulse independently and accurately       Understanding of Exercise Prescription  Yes       Intervention  Provide education, explanation, and written materials on patient's individual exercise prescription       Expected Outcomes  Short Term: Able to explain program exercise prescription;Long Term: Able to explain home exercise prescription to exercise independently           Exercise Goals Re-Evaluation : Exercise Goals Re-Evaluation    Row Name 08/10/17 1055 08/26/17 1502           Exercise Goal Re-Evaluation   Exercise Goals Review  Understanding of Exercise Prescription;Able to understand and use Dyspnea scale;Knowledge and understanding of Target Heart Rate Range (THRR);Able to understand and use rate of perceived exertion (RPE) scale  Increase Physical Activity;Able to understand and use Dyspnea scale;Increase Strength and Stamina;Knowledge and understanding of Target Heart Rate Range (THRR);Able to understand and use rate of perceived exertion (RPE) scale      Comments  Reviewed RPE scale, THR and program prescription with pt today.  Pt voiced understanding and was given a copy of goals to take home.   Pt is tolerating exercise well.  Staff will monitor progress.      Expected Outcomes  Short: Use RPE daily to regulate intensity.  Long: Follow program prescription in THR.  Short - attend LW and exercise 3 days per week Long - increase overall MET level         Discharge Exercise Prescription (Final Exercise Prescription Changes): Exercise Prescription Changes - 08/26/17 1500      Response to Exercise   Blood Pressure (Admit)  134/74    Blood Pressure (Exit)  126/64    Heart Rate (Admit)  73 bpm    Heart Rate (Exercise)  95 bpm    Heart Rate (Exit)  65 bpm    Oxygen Saturation (Admit)  95 %    Oxygen Saturation (Exercise)  95 %    Oxygen Saturation (Exit)  90 %    Rating of Perceived Exertion (Exercise)  12    Perceived Dyspnea (Exercise)  3    Duration  Progress to 45 minutes of aerobic exercise without signs/symptoms of physical distress    Intensity  THRR unchanged      Progression   Progression  Continue to progress workloads to maintain intensity without signs/symptoms of physical distress.    Average METs  2.4      Resistance Training   Training Prescription  Yes    Weight  4 lb    Reps  10-15      Treadmill   MPH  2.8    Grade   0.5    Minutes  15    METs  3.34      T5 Nustep   Level  2    SPM  80    Minutes  15    METs  1.9       Nutrition:  Target Goals: Understanding of nutrition guidelines, daily intake of sodium <1513m, cholesterol <2051m calories 30% from fat and 7% or less from saturated fats, daily to have 5 or more servings of fruits and vegetables.  Biometrics: Pre Biometrics - 08/03/17 1533      Pre Biometrics   Height  5' 10.9" (1.801 m)    Weight  144 lb 14.4 oz (65.7 kg)    Waist Circumference  36 inches    Hip Circumference  37 inches    Waist to Hip Ratio  0.97 %    BMI (Calculated)  20.26    Single Leg Stand  30 seconds        Nutrition Therapy Plan and Nutrition Goals: Nutrition Therapy & Goals - 08/31/17 1125      Nutrition Therapy   Diet  TLC    Protein (specify units)  13oz    Fiber  35 grams    Whole Grain Foods  3 servings    Saturated Fats  16 max. grams    Fruits and Vegetables  5 servings/day 8 ideal    Sodium  2000 grams      Personal Nutrition Goals   Nutrition Goal  Make protein a priority at each meal / snack. Aim for 15g/3oz of protein at meal times    Personal Goal #2  Add fats to meals and use liquid calorie sources such as Boost (continue to drink this daily) to help meet your daily nutritional requirements and prevent weight loss    Personal Goal #3  Consider eating smaller, more frequent "mini meals" to help prevent evening bloating. You may also want to limit the specific foods listed on the handout provided    Comments  He is satisfied with his CBW but does not want to lose more weight. Per his description, portion sizes at meal times are small      Intervention Plan   Intervention  Prescribe, educate and counsel regarding individualized specific dietary modifications aiming towards targeted core components such as weight, hypertension, lipid management, diabetes, heart failure and other comorbidities.;Nutrition handout(s) given to patient. Nutrition  guidelines for COPD, High calorie/ high protein nutrition therapy     Expected Outcomes  Short Term Goal: Understand basic principles of dietary content, such as calories, fat, sodium, cholesterol and nutrients.;Short Term Goal: A plan has been developed with personal nutrition goals set during dietitian appointment.;Long Term Goal: Adherence to prescribed nutrition plan.       Nutrition Assessments: Nutrition Assessments - 08/03/17 1432      MEDFICTS Scores   Pre Score  82       Nutrition Goals Re-Evaluation: Nutrition Goals Re-Evaluation    [place] Name 08/28/17 1038 08/31/17 1132           Goals   Current Weight  146 lb (66.2 kg)  -      Nutrition Goal  Learn to eat healthier. Maintain weight and gain strength  Add fats to meals and use liquid calorie sources such as Boost (continue to drink this daily) to help meet your daily nutritional requirements and prevent weight loss      Comment  Ferrel states that he eats good. He wants to meat with the dietician to see if he is getting enough nutrients.  He does not wish to lose weight. Currently drinks 1 Boost/day which his wife makes into a milkshake or smoothie      Expected Outcome  Short: Meet with the dietician. Long: Adhere to a diet plan recommended by the RD.  He will use the suggestions provided to add calories to meals via fats and protein. He will continue to drink Boost at least once per day        Personal Goal #2 Re-Evaluation   Personal Goal #2  -  Make protein a priority at each meal and snack. Aim for at least 15g/3oz of protein at meal times        Personal Goal #3 Re-Evaluation  Personal Goal #3  -  Consider eating smaller, more frequent "mini meals" to help prevent evening bloating. You may also want to limit the specific foods listed on the handout provided         Nutrition Goals Discharge (Final Nutrition Goals Re-Evaluation): Nutrition Goals Re-Evaluation - 08/31/17 1132      Goals   Nutrition Goal  Add fats to  meals and use liquid calorie sources such as Boost (continue to drink this daily) to help meet your daily nutritional requirements and prevent weight loss    Comment  He does not wish to lose weight. Currently drinks 1 Boost/day which his wife makes into a milkshake or smoothie    Expected Outcome  He will use the suggestions provided to add calories to meals via fats and protein. He will continue to drink Boost at least once per day      Personal Goal #2 Re-Evaluation   Personal Goal #2  Make protein a priority at each meal and snack. Aim for at least 15g/3oz of protein at meal times      Personal Goal #3 Re-Evaluation   Personal Goal #3  Consider eating smaller, more frequent "mini meals" to help prevent evening bloating. You may also want to limit the specific foods listed on the handout provided       Psychosocial: Target Goals: Acknowledge presence or absence of significant depression and/or stress, maximize coping skills, provide positive support system. Participant is able to verbalize types and ability to use techniques and skills needed for reducing stress and depression.   Initial Review & Psychosocial Screening: Initial Psych Review & Screening - 08/03/17 1429      Initial Review   Current issues with  Current Stress Concerns    Source of Stress Concerns  Chronic Illness    Comments  COPD is his main concern with his stress.      Family Dynamics   Good Support System?  Yes    Comments  He can look to his wife, dog and two childeren for support.      Barriers   Psychosocial barriers to participate in program  The patient should benefit from training in stress management and relaxation.      Screening Interventions   Interventions  To provide support and resources with identified psychosocial needs;Encouraged to exercise;Program counselor consult;Provide feedback about the scores to participant    Expected Outcomes  Short Term goal: Utilizing psychosocial counselor, staff and  physician to assist with identification of specific Stressors or current issues interfering with healing process. Setting desired goal for each stressor or current issue identified.;Long Term Goal: Stressors or current issues are controlled or eliminated.;Short Term goal: Identification and review with participant of any Quality of Life or Depression concerns found by scoring the questionnaire.;Long Term goal: The participant improves quality of Life and PHQ9 Scores as seen by post scores and/or verbalization of changes       Quality of Life Scores:  Scores of 19 and below usually indicate a poorer quality of life in these areas.  A difference of  2-3 points is a clinically meaningful difference.  A difference of 2-3 points in the total score of the Quality of Life Index has been associated with significant improvement in overall quality of life, self-image, physical symptoms, and general health in studies assessing change in quality of life.  PHQ-9: Recent Review Flowsheet Data    Depression screen [facility] 2/9 08/03/2017 04/16/2017 04/16/2017 06/17/2016 04/30/2015   Decreased  Interest 1 0 0 0 0   Down, Depressed, Hopeless - 0 0 0 0   PHQ - 2 Score 1 0 0 0 0   Altered sleeping 0 0 - - -   Tired, decreased energy 2 0 - - -   Change in appetite 1 0 - - -   Feeling bad or failure about yourself  1 0 - - -   Trouble concentrating 0 0 - - -   Moving slowly or fidgety/restless 0 0 - - -   Suicidal thoughts 0 0 - - -   PHQ-9 Score 5 0 - - -   Difficult doing work/chores Somewhat difficult Not difficult at all - - -     Interpretation of Total Score  Total Score Depression Severity:  1-4 = Minimal depression, 5-9 = Mild depression, 10-14 = Moderate depression, 15-19 = Moderately severe depression, 20-27 = Severe depression   Psychosocial Evaluation and Intervention: Psychosocial Evaluation - 08/24/17 1049      Psychosocial Evaluation & Interventions   Interventions  Encouraged to exercise with the  program and follow exercise prescription    Comments  Counselor met with Mr. Cass[facility]) today for initial psychosocial evaluation.  He is a 72 year old who has a diagnosis of COPD.  He has a strong support system with a spouse of 25 years; several adult children who live locally and active involvement in a local church.  Adom sleeps "okay" with about 5 hours most nights.  He has a good appetite and denies a history of depression or anxiety of any current symptoms.  Abhiram states he is typically in a positive mood most of the time and was quite jovial today in this assessment.  He reports minimal stress in his life - other than his health at this time.  Wesson has goals to get back on the golf course of 18 holes and breathe better - with more energy while in this program.  Staff will follow with him.      Expected Outcomes  Short:  Attikus will exercise consistently to increase his energy and breathe better.  He will attend the educational components of this program to understand and manage his condition more positively.  Long:  Asriel will consistently exercise and practice the skills he learns in order to get back on the golf course for 18 one day!    Continue Psychosocial Services   Follow up required by staff       Psychosocial Re-Evaluation:   Psychosocial Discharge (Final Psychosocial Re-Evaluation):   Education: Education Goals: Education classes will be provided on a weekly basis, covering required topics. Participant will state understanding/return demonstration of topics presented.  Learning Barriers/Preferences: Learning Barriers/Preferences - 08/03/17 1438      Learning Barriers/Preferences   Learning Barriers  Sight prescription do    Learning Preferences  None       Education Topics:  Initial Evaluation Education: - Verbal, written and demonstration of respiratory meds, oximetry and breathing techniques. Instruction on use of nebulizers and MDIs and importance of monitoring MDI  activations.   Pulmonary Rehab from 08/31/2017 in [facility] Cardiac and Pulmonary Rehab  Date  08/03/17  Educator  [facility]  Instruction Review Code  1- Verbalizes Understanding      General Nutrition Guidelines/Fats and Fiber: -Group instruction provided by verbal, written material, models and posters to present the general guidelines for heart healthy nutrition. Gives an explanation and review of dietary fats and fiber.   Pulmonary  Rehab from 08/31/2017 in [facility] Cardiac and Pulmonary Rehab  Date  08/24/17  Educator  CR  Instruction Review Code  1- Verbalizes Understanding      Controlling Sodium/Reading Food Labels: -Group verbal and written material supporting the discussion of sodium use in heart healthy nutrition. Review and explanation with models, verbal and written materials for utilization of the food label.   Pulmonary Rehab from 08/31/2017 in [facility] Cardiac and Pulmonary Rehab  Date  08/31/17  Educator  CR  Instruction Review Code  1- Verbalizes Understanding      Exercise Physiology & General Exercise Guidelines: - Group verbal and written instruction with models to review the exercise physiology of the cardiovascular system and associated critical values. Provides general exercise guidelines with specific guidelines to those with heart or lung disease.    Aerobic Exercise & Resistance Training: - Gives group verbal and written instruction on the various components of exercise. Focuses on aerobic and resistive training programs and the benefits of this training and how to safely progress through these programs.   Flexibility, Balance, Mind/Body Relaxation: Provides group verbal/written instruction on the benefits of flexibility and balance training, including mind/body exercise modes such as yoga, pilates and tai chi.  Demonstration and skill practice provided.   Stress and Anxiety: - Provides group verbal and written instruction about the health risks of elevated stress and causes  of high stress.  Discuss the correlation between heart/lung disease and anxiety and treatment options. Review healthy ways to manage with stress and anxiety.   Depression: - Provides group verbal and written instruction on the correlation between heart/lung disease and depressed mood, treatment options, and the stigmas associated with seeking treatment.   Exercise & Equipment Safety: - Individual verbal instruction and demonstration of equipment use and safety with use of the equipment.   Pulmonary Rehab from 08/31/2017 in [facility] Cardiac and Pulmonary Rehab  Date  08/03/17  Educator  [facility]  Instruction Review Code  1- Verbalizes Understanding      Infection Prevention: - Provides verbal and written material to individual with discussion of infection control including proper hand washing and proper equipment cleaning during exercise session.   Pulmonary Rehab from 08/31/2017 in [facility] Cardiac and Pulmonary Rehab  Date  08/03/17  Educator  [facility]  Instruction Review Code  1- Verbalizes Understanding      Falls Prevention: - Provides verbal and written material to individual with discussion of falls prevention and safety.   Pulmonary Rehab from 08/31/2017 in [facility] Cardiac and Pulmonary Rehab  Date  08/03/17  Educator  [facility]  Instruction Review Code  1- Verbalizes Understanding      Diabetes: - Individual verbal and written instruction to review signs/symptoms of diabetes, desired ranges of glucose level fasting, after meals and with exercise. Advice that pre and post exercise glucose checks will be done for 3 sessions at entry of program.   Chronic Lung Diseases: - Group verbal and written instruction to review updates, respiratory medications, advancements in procedures and treatments. Discuss use of supplemental oxygen including available portable oxygen systems, continuous and intermittent flow rates, concentrators, personal use and safety guidelines. Review proper use of inhaler and spacers.  Provide informative websites for self-education.    Energy Conservation: - Provide group verbal and written instruction for methods to conserve energy, plan and organize activities. Instruct on pacing techniques, use of adaptive equipment and posture/positioning to relieve shortness of breath.   Triggers and Exacerbations: - Group verbal and written instruction to review types of  environmental triggers and ways to prevent exacerbations. Discuss weather changes, air quality and the benefits of nasal washing. Review warning signs and symptoms to help prevent infections. Discuss techniques for effective airway clearance, coughing, and vibrations.   AED/CPR: - Group verbal and written instruction with the use of models to demonstrate the basic use of the AED with the basic ABC's of resuscitation.   Anatomy and Physiology of the Lungs: - Group verbal and written instruction with the use of models to provide basic lung anatomy and physiology related to function, structure and complications of lung disease.   Anatomy & Physiology of the Heart: - Group verbal and written instruction and models provide basic cardiac anatomy and physiology, with the coronary electrical and arterial systems. Review of Valvular disease and Heart Failure   Cardiac Medications: - Group verbal and written instruction to review commonly prescribed medications for heart disease. Reviews the medication, class of the drug, and side effects.   Know Your Numbers and Risk Factors: -Group verbal and written instruction about important numbers in your health.  Discussion of what are risk factors and how they play a role in the disease process.  Review of Cholesterol, Blood Pressure, Diabetes, and BMI and the role they play in your overall health.   Sleep Hygiene: -Provides group verbal and written instruction about how sleep can affect your health.  Define sleep hygiene, discuss sleep cycles and impact of sleep habits.  Review good sleep hygiene tips.    Other: -Provides group and verbal instruction on various topics (see comments)    Knowledge Questionnaire Score: Knowledge Questionnaire Score - 08/03/17 1436      Knowledge Questionnaire Score   Pre Score  13/18 reviewed with patient        Core Components/Risk Factors/Patient Goals at Admission: Personal Goals and Risk Factors at Admission - 08/03/17 1443      Core Components/Risk Factors/Patient Goals on Admission    Weight Management  Yes;Weight Maintenance    Intervention  Weight Management: Develop a combined nutrition and exercise program designed to reach desired caloric intake, while maintaining appropriate intake of nutrient and fiber, sodium and fats, and appropriate energy expenditure required for the weight goal.;Weight Management: Provide education and appropriate resources to help participant work on and attain dietary goals.;Weight Management/Obesity: Establish reasonable short term and long term weight goals.    Admit Weight  144 lb 14.4 oz (65.7 kg)    Goal Weight: Short Term  145 lb (65.8 kg)    Goal Weight: Long Term  145 lb (65.8 kg)    Expected Outcomes  Short Term: Continue to assess and modify interventions until short term weight is achieved;Weight Maintenance: Understanding of the daily nutrition guidelines, which includes 25-35% calories from fat, 7% or less cal from saturated fats, less than 265m cholesterol, less than 1.5gm of sodium, & 5 or more servings of fruits and vegetables daily;Long Term: Adherence to nutrition and physical activity/exercise program aimed toward attainment of established weight goal;Understanding recommendations for meals to include 15-35% energy as protein, 25-35% energy from fat, 35-60% energy from carbohydrates, less than 2084mof dietary cholesterol, 20-35 gm of total fiber daily;Understanding of distribution of calorie intake throughout the day with the consumption of 4-5 meals/snacks    Improve  shortness of breath with ADL's  Yes    Intervention  Provide education, individualized exercise plan and daily activity instruction to help decrease symptoms of SOB with activities of daily living.    Expected Outcomes  Short Term:  Improve cardiorespiratory fitness to achieve a reduction of symptoms when performing ADLs;Long Term: Be able to perform more ADLs without symptoms or delay the onset of symptoms       Core Components/Risk Factors/Patient Goals Review:  Goals and Risk Factor Review    Row Name 08/28/17 1038             Core Components/Risk Factors/Patient Goals Review   Personal Goals Review  Weight Management/Obesity;Improve shortness of breath with ADL's       Review  Tajay has maintained his weight since he started the program. His ADLs have been improving at home. He cannot work in the yar if it is to hot or humid.       Expected Outcomes  Short: Attend [facility] regularly to improve shortness of breath with ADL's. Long: maintain independence with ADL's           Core Components/Risk Factors/Patient Goals at Discharge (Final Review):  Goals and Risk Factor Review - 08/28/17 1038      Core Components/Risk Factors/Patient Goals Review   Personal Goals Review  Weight Management/Obesity;Improve shortness of breath with ADL's    Review  Lelend has maintained his weight since he started the program. His ADLs have been improving at home. He cannot work in the yar if it is to hot or humid.    Expected Outcomes  Short: Attend [facility] regularly to improve shortness of breath with ADL's. Long: maintain independence with ADL's        ITP Comments: ITP Comments    Row Name 08/03/17 1358 08/10/17 0947 09/07/17 0849       ITP Comments  Medical Evaluation completed. Chart sent for review and changes to Dr. Emily Filbert Director of [place]. Diagnosis can be found in [facility] encounter 06/22/17  30 day review completed. ITP sent to Dr. Emily Filbert Director of [place]. Continue with ITP  unless changes are made by physician  30 day review completed. ITP sent to Dr. Emily Filbert Director of [place]. Continue with ITP unless changes are made by physician        Comments: 30 day review

## 2017-09-11 ENCOUNTER — Encounter: Payer: PPO | Attending: Pulmonary Disease | Admitting: *Deleted

## 2017-09-11 DIAGNOSIS — Z8701 Personal history of pneumonia (recurrent): Secondary | ICD-10-CM | POA: Insufficient documentation

## 2017-09-11 DIAGNOSIS — J449 Chronic obstructive pulmonary disease, unspecified: Secondary | ICD-10-CM | POA: Diagnosis not present

## 2017-09-11 DIAGNOSIS — K5190 Ulcerative colitis, unspecified, without complications: Secondary | ICD-10-CM | POA: Insufficient documentation

## 2017-09-11 DIAGNOSIS — Z79899 Other long term (current) drug therapy: Secondary | ICD-10-CM | POA: Insufficient documentation

## 2017-09-11 DIAGNOSIS — Z7982 Long term (current) use of aspirin: Secondary | ICD-10-CM | POA: Diagnosis not present

## 2017-09-11 DIAGNOSIS — Z87891 Personal history of nicotine dependence: Secondary | ICD-10-CM | POA: Diagnosis not present

## 2017-09-11 DIAGNOSIS — Z7951 Long term (current) use of inhaled steroids: Secondary | ICD-10-CM | POA: Insufficient documentation

## 2017-09-11 NOTE — Progress Notes (Signed)
Daily Session Note  Patient Details  Name: Antonio [name] MRN: [id number] Date of Birth: June 05, 1945 Referring Provider:     Pulmonary Rehab from 08/03/2017 in [facility] Cardiac and Pulmonary Rehab  Referring Provider  Simonne Maffucci MD      Encounter Date: 09/11/2017  Check In: Session Check In - 09/11/17 1031      Check-In   Supervising physician immediately available to respond to emergencies  [facility] immediately available ER MD    Physician(s)  Dr. Cherylann Banas and Jacqualine Code     Location  [facility]-Cardiac & Pulmonary Rehab    Staff Present  Justin Mend RCP,RRT,BSRT;Meredith Sherryll Burger, RN Vickki Hearing, BA, ACSM CEP, Exercise Physiologist    Medication changes reported      No    Fall or balance concerns reported     No    Tobacco Cessation  No Change    Warm-up and Cool-down  Performed as group-led instruction    Resistance Training Performed  Yes    VAD Patient?  No      Pain Assessment   Currently in Pain?  No/denies          Social History   Tobacco Use  Smoking Status Former Smoker  . Packs/day: 0.75  . Years: 35.00  . Pack years: 26.25  . Types: Cigarettes, Cigars  . Last attempt to quit: 06/11/2011  . Years since quitting: 6.2  Smokeless Tobacco Never Used  Tobacco Comment   smoking cessation materials not required    Goals Met:  Proper associated with RPD/PD & O2 Sat Independence with exercise equipment Exercise tolerated well Strength training completed today  Goals Unmet:  Not Applicable  Comments: Reviewed home exercise with pt today.  Pt plans to walk and join [employer] [place] for exercise.  Reviewed THR, pulse, RPE, sign and symptoms, NTG use, and when to call 911 or MD.  Also discussed weather considerations and indoor options.  Pt voiced understanding.    Dr. Emily Filbert is Medical Director for [place] and [facility].

## 2017-09-11 NOTE — Progress Notes (Signed)
Daily Session Note  Patient Details  Name: Antonio [name] MRN: [id number] Date of Birth: [birth year] Referring Provider:     Pulmonary Rehab from 08/03/2017 in [facility] Cardiac and Pulmonary Rehab  Referring Provider  Simonne Maffucci MD      Encounter Date: 09/11/2017  Check In: Session Check In - 09/11/17 1031      Check-In   Supervising physician immediately available to respond to emergencies  [facility] immediately available ER MD    Physician(s)  Dr. Cherylann Banas and Jacqualine Code     Location  [facility]-Cardiac & Pulmonary Rehab    Staff Present  Justin Mend RCP,RRT,BSRT;Jniya Madara Sherryll Burger, RN Vickki Hearing, BA, ACSM CEP, Exercise Physiologist    Medication changes reported      No    Fall or balance concerns reported     No    Tobacco Cessation  No Change    Warm-up and Cool-down  Performed as group-led instruction    Resistance Training Performed  Yes    VAD Patient?  No      Pain Assessment   Currently in Pain?  No/denies          Social History   Tobacco Use  Smoking Status Former Smoker  . Packs/day: 0.75  . Years: 35.00  . Pack years: 26.25  . Types: Cigarettes, Cigars  . Last attempt to quit: 06/11/2011  . Years since quitting: 6.2  Smokeless Tobacco Never Used  Tobacco Comment   smoking cessation materials not required    Goals Met:  Proper associated with RPD/PD & O2 Sat Independence with exercise equipment Using PLB without cueing & demonstrates good technique Exercise tolerated well No report of cardiac concerns or symptoms Strength training completed today  Goals Unmet:  Not Applicable  Comments: Pt able to follow exercise prescription today without complaint.  Will continue to monitor for progression.    Dr. Emily Filbert is Medical Director for [place] and [facility].

## 2017-09-21 ENCOUNTER — Encounter: Payer: PPO | Admitting: *Deleted

## 2017-09-21 DIAGNOSIS — J449 Chronic obstructive pulmonary disease, unspecified: Secondary | ICD-10-CM | POA: Diagnosis not present

## 2017-09-21 NOTE — Progress Notes (Signed)
Daily Session Note  Patient Details  Name: Antonio [name] MRN: [id number] Date of Birth: 1945-02-25 Referring Provider:     Pulmonary Rehab from 08/03/2017 in [facility] Cardiac and Pulmonary Rehab  Referring Provider  Simonne Maffucci MD      Encounter Date: 09/21/2017  Check In: Session Check In - 09/21/17 1023      Check-In   Supervising physician immediately available to respond to emergencies  [facility] immediately available ER MD    Physician(s)  Dr. Quentin Cornwall and Dr. Corky Downs    Location  [facility]-Cardiac & Pulmonary Rehab    Staff Present  Joellyn Rued, BS, PEC;Tresten Pantoja [place], BS, ACSM CEP, Exercise Physiologist;Amanda Oletta Darter, [place], ACSM CEP, Exercise Physiologist    Medication changes reported      No    Fall or balance concerns reported     No    Tobacco Cessation  No Change    Warm-up and Cool-down  Performed as group-led instruction    Resistance Training Performed  Yes    VAD Patient?  No    PAD/SET Patient?  No      Pain Assessment   Currently in Pain?  No/denies    Multiple Pain Sites  No          Social History   Tobacco Use  Smoking Status Former Smoker  . Packs/day: 0.75  . Years: 35.00  . Pack years: 26.25  . Types: Cigarettes, Cigars  . Last attempt to quit: 06/11/2011  . Years since quitting: 6.2  Smokeless Tobacco Never Used  Tobacco Comment   smoking cessation materials not required    Goals Met:  Proper associated with RPD/PD & O2 Sat Independence with exercise equipment Exercise tolerated well Personal goals reviewed No report of cardiac concerns or symptoms Strength training completed today  Goals Unmet:  Not Applicable  Comments: Pt able to follow exercise prescription today without complaint.  Will continue to monitor for progression.    Dr. Emily Filbert is Medical Director for [place] and [facility].

## 2017-09-24 ENCOUNTER — Ambulatory Visit: Payer: PPO | Admitting: Internal Medicine

## 2017-09-24 ENCOUNTER — Encounter: Payer: Self-pay | Admitting: Internal Medicine

## 2017-09-24 VITALS — BP 104/68 | HR 56 | Ht 70.0 in | Wt 147.5 lb

## 2017-09-24 DIAGNOSIS — Z86010 Personal history of colonic polyps: Secondary | ICD-10-CM | POA: Diagnosis not present

## 2017-09-24 DIAGNOSIS — R1013 Epigastric pain: Secondary | ICD-10-CM

## 2017-09-24 DIAGNOSIS — K5190 Ulcerative colitis, unspecified, without complications: Secondary | ICD-10-CM

## 2017-09-24 MED ORDER — MESALAMINE 1.2 G PO TBEC: 4.8000 g | DELAYED_RELEASE_TABLET | Freq: Every day | ORAL | 2 refills | Status: DC

## 2017-09-24 NOTE — Progress Notes (Signed)
   Subjective:    Patient ID: Antonio [name], male    DOB: Jan 16, 1946, 72 y.o.   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  Last seen 02/27/2017.  Here today with his wife.  Currently doing very well.  Using Lialda 2.4 g daily and Canasa 1 g daily.  No recent flares of his colitis.  No blood in stool or melena.  Bowel movements regular 1 or 2 times per day.  No abdominal pain.  Taking omeprazole 20 mg daily which has helped with upper abdominal pressure and bloating associated with eating.  Also feels like his COPD contributes to his upper abdominal bloating and he is undergoing pulmonary rehab and sees Dr. Lake Bells.  We will occasionally use loperamide.  He is excited for the [employer] football season.  Review of Systems As per HPI, otherwise negative  Current Medications, Allergies, Past Medical History, Past Surgical History, Family History and Social History were reviewed in [employer] record.     Objective:   Physical Exam BP 104/68 (Cuff Size: Normal)   Pulse (!) 56   Ht 5' 10"  (1.778 m)   Wt 147 lb 8 oz (66.9 kg)   BMI 21.16 kg/m  Constitutional: Well-developed and well-nourished. No distress. HEENT: Normocephalic and atraumatic.  No scleral icterus. Neck: Neck supple. Trachea midline. Cardiovascular: Normal rate, regular rhythm and intact distal pulses. No M/R/G Pulmonary/chest: Effort normal and breath sounds distant. No wheezing, rales or rhonchi. Abdominal: Soft, nontender, nondistended. Bowel sounds active throughout.  Extremities: no clubbing, cyanosis, or edema Neurological: Alert and oriented to person place and time. Skin: Skin is warm and dry. Psychiatric: Normal mood and affect. Behavior is normal.     Assessment & Plan:  72 year old male with a history of ulcerative  proctitis/colitis diagnosed in July 2015, adenomatous colon polyps, GERD/dyspepsia, COPD who is here for follow-up.   1.  Ulcerative colitis/proctitis --doing well on Lialda and Canasa.  Continue Lialda 2.4 g daily and Canasa 1 g nightly.  Currently in clinical remission.  2.  Dyspepsia/upper abdominal bloating --improved with PPI.  Continue omeprazole 20 mg daily.  3.  History of colon polyps --surveillance colonoscopy recommended next June, 2020  15 minutes spent with the patient today. Greater than 50% was spent in counseling and coordination of care with the patient

## 2017-09-24 NOTE — Patient Instructions (Signed)
Continue over the counter Prilosec (omeprazole).  We have sent the following medications to your pharmacy for you to pick up at your convenience: Lialda 4.8 grams daily.  Follow up with Dr Hilarie Fredrickson in 1 year.  You will be due for a recall colonoscopy in 07/2018. We will send you a reminder in the mail when it gets closer to that time.  If you are age 74 or older, your body mass index should be between 23-30. Your Body mass index is 21.16 kg/m. If this is out of the aforementioned range listed, please consider follow up with your Primary Care Provider.  If you are age 73 or younger, your body mass index should be between 19-25. Your Body mass index is 21.16 kg/m. If this is out of the aformentioned range listed, please consider follow up with your Primary Care Provider.

## 2017-09-25 ENCOUNTER — Encounter: Payer: PPO | Admitting: *Deleted

## 2017-09-25 DIAGNOSIS — J449 Chronic obstructive pulmonary disease, unspecified: Secondary | ICD-10-CM

## 2017-09-25 NOTE — Progress Notes (Signed)
Daily Session Note  Patient Details  Name: Antonio [name] MRN: [id number] Date of Birth: 07-19-1945 Referring Provider:     Pulmonary Rehab from 08/03/2017 in [facility]  Referring Provider  Simonne Maffucci MD      Encounter Date: 09/25/2017  Check In: Session Check In - 09/25/17 1046      Check-In   Supervising physician immediately available to respond to emergencies  [facility] immediately available ER MD    Physician(s)  Pt reports that she has not been feeling well.     Location  [facility]    Staff Present  Carson Myrtle, BS, RRT, Respiratory Therapist;Meredith Sherryll Burger, RN Vickki Hearing, BA, ACSM CEP, Exercise Physiologist    Medication changes reported      No    Fall or balance concerns reported     No    Warm-up and Cool-down  Performed as group-led instruction    Resistance Training Performed  Yes    VAD Patient?  No    PAD/SET Patient?  No      Pain Assessment   Currently in Pain?  No/denies          Social History   Tobacco Use  Smoking Status Former Smoker  . Packs/day: 0.75  . Years: 35.00  . Pack years: 26.25  . Types: Cigarettes, Cigars  . Last attempt to quit: 06/11/2011  . Years since quitting: 6.2  Smokeless Tobacco Never Used  Tobacco Comment   smoking cessation materials not required    Goals Met:  Proper associated with RPD/PD & O2 Sat Using PLB without cueing & demonstrates good technique Exercise tolerated well No report of cardiac concerns or symptoms Strength training completed today  Goals Unmet:  Not Applicable  Comments: Pt able to follow exercise prescription today without complaint.  Will continue to monitor for progression.    Dr. Emily Filbert is Medical Director for [place] [facility] and [facility].

## 2017-10-05 DIAGNOSIS — J449 Chronic obstructive pulmonary disease, unspecified: Secondary | ICD-10-CM

## 2017-10-05 NOTE — Progress Notes (Signed)
Pulmonary Individual Treatment Plan  Patient Details  Name: Antonio [name] MRN: [id number] Date of Birth: Oct 30, 1945 Referring Provider:     Pulmonary Rehab from 08/03/2017 in [facility] Cardiac and Pulmonary Rehab  Referring Provider  Simonne Maffucci MD      Initial Encounter Date:    Pulmonary Rehab from 08/03/2017 in [facility] Cardiac and Pulmonary Rehab  Date  08/03/17      Visit Diagnosis: Chronic obstructive pulmonary disease, unspecified COPD type ([place])  Patient's Home Medications on Admission:  Current Outpatient Medications:  .  albuterol (PROVENTIL HFA;VENTOLIN HFA) 108 (90 Base) MCG/ACT inhaler, Inhale 2 puffs into the lungs every 6 (six) hours as needed for wheezing or shortness of breath., Disp: 3 Inhaler, Rfl: 3 .  albuterol (PROVENTIL) (2.5 MG/3ML) 0.083% nebulizer solution, Take 3 mLs (2.5 mg total) by nebulization every 6 (six) hours as needed for wheezing or shortness of breath., Disp: 75 mL, Rfl: 12 .  budesonide-formoterol (SYMBICORT) 160-4.5 MCG/ACT inhaler, Inhale 2 puffs into the lungs 2 (two) times daily., Disp: , Rfl:  .  EPINEPHrine 0.3 mg/0.3 mL IJ SOAJ injection, Inject 0.3 mLs (0.3 mg total) into the muscle once., Disp: 1 Device, Rfl: 1 .  fluticasone (FLONASE) 50 MCG/ACT nasal spray, Place 2 sprays into both nostrils daily., Disp: 16 g, Rfl: 6 .  loperamide (IMODIUM) 2 MG capsule, Take 2 mg by mouth daily as needed for diarrhea or loose stools. , Disp: , Rfl:  .  mesalamine (LIALDA) 1.2 g EC tablet, Take 4 tablets (4.8 g total) by mouth daily with breakfast., Disp: 360 tablet, Rfl: 2 .  omeprazole (PRILOSEC) 20 MG capsule, , Disp: , Rfl:  .  Respiratory Therapy Supplies (FLUTTER) DEVI, Use as directed, Disp: 1 each, Rfl: 0 .  Tiotropium Bromide Monohydrate (SPIRIVA RESPIMAT) 2.5 MCG/ACT AERS, Inhale 2 puffs into the lungs daily., Disp: 1 Inhaler, Rfl: 0  Current Facility-Administered Medications:  .  albuterol (PROVENTIL) (2.5 MG/3ML) 0.083% nebulizer  solution 2.5 mg, 2.5 mg, Nebulization, Once, [name], MD  Past Medical History: Past Medical History:  Diagnosis Date  . Allergy   . Calf pain   . COPD (chronic obstructive pulmonary disease) ([place])   . Dyspnea   . Hypokalemia   . Internal hemorrhoids   . Muscle cramp   . Muscle pain   . Pneumonia   . PONV (postoperative nausea and vomiting)   . Tubular adenoma of colon   . Ulcerative colitis ([place])     Tobacco Use: Social History   Tobacco Use  Smoking Status Former Smoker  . Packs/day: 0.75  . Years: 35.00  . Pack years: 26.25  . Types: Cigarettes, Cigars  . Last attempt to quit: 06/11/2011  . Years since quitting: 6.3  Smokeless Tobacco Never Used  Tobacco Comment   smoking cessation materials not required    Labs: Recent Review Flowsheet Data    Labs for ITP Cardiac and Pulmonary Rehab Latest Ref Rng & Units 11/08/2014 12/01/2016   Cholestrol 100 - 199 mg/dL 154 151   LDLCALC 0 - 99 mg/dL 89 91   HDL >39 mg/dL 53 50   Trlycerides 0 - 149 mg/dL 61 51       Pulmonary Assessment Scores: Pulmonary Assessment Scores    Row Name 08/03/17 1435         ADL UCSD   ADL Phase  Entry     SOB Score total  45     Rest  0  Walk  3     Stairs  5     Bath  1     Dress  2     Shop  3       CAT Score   CAT Score  13       mMRC Score   mMRC Score  2        Pulmonary Function Assessment: Pulmonary Function Assessment - 08/03/17 1436      Pulmonary Function Tests   FVC%  59 %   test performed on 06/07/14   FEV1%  40 %    FEV1/FVC Ratio  51      Breath   Bilateral Breath Sounds  Clear    Shortness of Breath  Limiting activity;Yes       Exercise Target Goals: Exercise Program Goal: Individual exercise prescription set using results from initial 6 min walk test and THRR while considering  patient's activity barriers and safety.   Exercise Prescription Goal: Initial exercise prescription builds to 30-45 minutes a day of aerobic activity, 2-3  days per week.  Home exercise guidelines will be given to patient during program as part of exercise prescription that the participant will acknowledge.  Activity Barriers & Risk Stratification: Activity Barriers & Cardiac Risk Stratification - 08/03/17 1531      Activity Barriers & Cardiac Risk Stratification   Activity Barriers  Deconditioning;Shortness of Breath       6 Minute Walk: 6 Minute Walk    Row Name 08/03/17 1528         6 Minute Walk   Phase  Initial     Distance  1515 feet     Walk Time  6 minutes     # of Rest Breaks  0     MPH  2.87     METS  3.44     RPE  12     Perceived Dyspnea   3     VO2 Peak  12.42     Symptoms  Yes (comment)     Comments  SOB, cramp in left calf     Resting HR  63 bpm     Resting BP  134/60     Resting Oxygen Saturation   99 %     Exercise Oxygen Saturation  during 6 min walk  85 %     Max Ex. HR  74 bpm     Max Ex. BP  150/64     2 Minute Post BP  142/60       Interval HR   1 Minute HR  69     2 Minute HR  67     3 Minute HR  68     4 Minute HR  69     5 Minute HR  68     6 Minute HR  84     2 Minute Post HR  75     Interval Heart Rate?  Yes       Interval Oxygen   Interval Oxygen?  Yes     Baseline Oxygen Saturation %  99 %     1 Minute Oxygen Saturation %  95 %     1 Minute Liters of Oxygen  0 L Room Air     2 Minute Oxygen Saturation %  90 %     2 Minute Liters of Oxygen  0 L     3 Minute Oxygen Saturation %  93 %  3 Minute Liters of Oxygen  0 L     4 Minute Oxygen Saturation %  87 %     4 Minute Liters of Oxygen  0 L     5 Minute Oxygen Saturation %  89 %     5 Minute Liters of Oxygen  0 L     6 Minute Oxygen Saturation %  85 %     6 Minute Liters of Oxygen  0 L     2 Minute Post Oxygen Saturation %  97 %     2 Minute Post Liters of Oxygen  0 L       Oxygen Initial Assessment: Oxygen Initial Assessment - 08/03/17 1439      Home Oxygen   Home Oxygen Device  None    Sleep Oxygen Prescription  None     Home Exercise Oxygen Prescription  None    Home at Rest Exercise Oxygen Prescription  None      Initial 6 min Walk   Oxygen Used  None      Program Oxygen Prescription   Program Oxygen Prescription  None      Intervention   Short Term Goals  To learn and demonstrate proper use of respiratory medications;To learn and demonstrate proper pursed lip breathing techniques or other breathing techniques.;To learn and understand importance of maintaining oxygen saturations>88%;To learn and understand importance of monitoring SPO2 with pulse oximeter and demonstrate accurate use of the pulse oximeter.    Long  Term Goals  Verbalizes importance of monitoring SPO2 with pulse oximeter and return demonstration;Maintenance of O2 saturations>88%;Exhibits proper breathing techniques, such as pursed lip breathing or other method taught during program session;Demonstrates proper use of MDI's;Compliance with respiratory medication       Oxygen Re-Evaluation: Oxygen Re-Evaluation    Row Name 08/10/17 1055 08/28/17 1041 09/21/17 1108         Program Oxygen Prescription   Program Oxygen Prescription  None  None  None       Home Oxygen   Home Oxygen Device  None  None  None     Sleep Oxygen Prescription  None  None  None     Home Exercise Oxygen Prescription  None  None  None     Home at Rest Exercise Oxygen Prescription  None  None  None       Goals/Expected Outcomes   Short Term Goals  To learn and demonstrate proper use of respiratory medications;To learn and demonstrate proper pursed lip breathing techniques or other breathing techniques.;To learn and understand importance of maintaining oxygen saturations>88%;To learn and understand importance of monitoring SPO2 with pulse oximeter and demonstrate accurate use of the pulse oximeter.  To learn and demonstrate proper use of respiratory medications;To learn and demonstrate proper pursed lip breathing techniques or other breathing techniques.;To learn and  understand importance of maintaining oxygen saturations>88%;To learn and understand importance of monitoring SPO2 with pulse oximeter and demonstrate accurate use of the pulse oximeter.  To learn and demonstrate proper use of respiratory medications;To learn and demonstrate proper pursed lip breathing techniques or other breathing techniques.;To learn and understand importance of maintaining oxygen saturations>88%;To learn and understand importance of monitoring SPO2 with pulse oximeter and demonstrate accurate use of the pulse oximeter.     Long  Term Goals  Verbalizes importance of monitoring SPO2 with pulse oximeter and return demonstration;Maintenance of O2 saturations>88%;Exhibits proper breathing techniques, such as pursed lip breathing or other method taught during program session;Demonstrates proper use  of MDI's;Compliance with respiratory medication  Verbalizes importance of monitoring SPO2 with pulse oximeter and return demonstration;Maintenance of O2 saturations>88%;Exhibits proper breathing techniques, such as pursed lip breathing or other method taught during program session;Demonstrates proper use of MDI's;Compliance with respiratory medication  Verbalizes importance of monitoring SPO2 with pulse oximeter and return demonstration;Maintenance of O2 saturations>88%;Exhibits proper breathing techniques, such as pursed lip breathing or other method taught during program session;Demonstrates proper use of MDI's;Compliance with respiratory medication     Comments  Reviewed PLB technique with pt.  Talked about how it work and it's important to maintaining his exercise saturations.    Masai is going well with his PLB and checking his oxygen at home. One thing he can do more is check his oxygen while he is doing something strenuous. He has been in the low 90s range with his oxygen while exercising.  Devondre continues to use his PLB at home and in class during exercise. He stated he feels like he is breathing  better and his O2 Sats at home are maintained above 92%.     Goals/Expected Outcomes  Short: Become more profiecient at using PLB.   Long: Become independent at using PLB.  Short: monitor oxygen at home with exertion. Long: maintain oxygen saturations above 88 percent independently.  Short: continue to monitor oxygen at home with exercise or exertion. Long: independently monitor oxygen sats and maintain above 88 percent.         Oxygen Discharge (Final Oxygen Re-Evaluation): Oxygen Re-Evaluation - 09/21/17 1108      Program Oxygen Prescription   Program Oxygen Prescription  None      Home Oxygen   Home Oxygen Device  None    Sleep Oxygen Prescription  None    Home Exercise Oxygen Prescription  None    Home at Rest Exercise Oxygen Prescription  None      Goals/Expected Outcomes   Short Term Goals  To learn and demonstrate proper use of respiratory medications;To learn and demonstrate proper pursed lip breathing techniques or other breathing techniques.;To learn and understand importance of maintaining oxygen saturations>88%;To learn and understand importance of monitoring SPO2 with pulse oximeter and demonstrate accurate use of the pulse oximeter.    Long  Term Goals  Verbalizes importance of monitoring SPO2 with pulse oximeter and return demonstration;Maintenance of O2 saturations>88%;Exhibits proper breathing techniques, such as pursed lip breathing or other method taught during program session;Demonstrates proper use of MDI's;Compliance with respiratory medication    Comments  Othell continues to use his PLB at home and in class during exercise. He stated he feels like he is breathing better and his O2 Sats at home are maintained above 92%.    Goals/Expected Outcomes  Short: continue to monitor oxygen at home with exercise or exertion. Long: independently monitor oxygen sats and maintain above 88 percent.        Initial Exercise Prescription: Initial Exercise Prescription - 08/03/17 1500       Date of Initial Exercise RX and Referring Provider   Date  08/03/17    Referring Provider  Simonne Maffucci MD      Treadmill   MPH  2.8    Grade  0.5    Minutes  15    METs  3.34      REL-XR   Level  2    Speed  50    Minutes  15    METs  3      T5 Nustep   Level  2  SPM  80    Minutes  15    METs  3      Prescription Details   Frequency (times per week)  2    Duration  Progress to 45 minutes of aerobic exercise without signs/symptoms of physical distress      Intensity   THRR 40-80% of Max Heartrate  96-129    Ratings of Perceived Exertion  11-13    Perceived Dyspnea  0-4      Progression   Progression  Continue to progress workloads to maintain intensity without signs/symptoms of physical distress.      Resistance Training   Training Prescription  Yes    Weight  4 lbs    Reps  10-15       Perform Capillary Blood Glucose checks as needed.  Exercise Prescription Changes: Exercise Prescription Changes    Row Name 08/03/17 1500 08/12/17 1400 08/26/17 1500 09/09/17 1500 09/11/17 1200     Response to Exercise   Blood Pressure (Admit)  134/60  124/60  134/74  110/72  110/72   Blood Pressure (Exercise)  150/64  142/64  -  -  -   Blood Pressure (Exit)  142/60  118/64  126/64  110/60  110/60   Heart Rate (Admit)  63 bpm  63 bpm  73 bpm  78 bpm  78 bpm   Heart Rate (Exercise)  84 bpm  88 bpm  95 bpm  89 bpm  89 bpm   Heart Rate (Exit)  75 bpm  75 bpm  65 bpm  69 bpm  69 bpm   Oxygen Saturation (Admit)  99 %  95 %  95 %  94 %  94 %   Oxygen Saturation (Exercise)  85 %  90 %  95 %  93 %  93 %   Oxygen Saturation (Exit)  97 %  97 %  90 %  95 %  95 %   Rating of Perceived Exertion (Exercise)  12  13  12  10  10    Perceived Dyspnea (Exercise)  3  3  3  1  1    Symptoms  SOB, cramp in left calf  SOB  -  -  -   Comments  walk test results  first full day of exercise  -  -  -   Duration  -  Progress to 45 minutes of aerobic exercise without signs/symptoms of  physical distress  Progress to 45 minutes of aerobic exercise without signs/symptoms of physical distress  Progress to 45 minutes of aerobic exercise without signs/symptoms of physical distress  Progress to 45 minutes of aerobic exercise without signs/symptoms of physical distress   Intensity  -  THRR unchanged  THRR unchanged  THRR unchanged  THRR unchanged     Progression   Progression  -  Continue to progress workloads to maintain intensity without signs/symptoms of physical distress.  Continue to progress workloads to maintain intensity without signs/symptoms of physical distress.  Continue to progress workloads to maintain intensity without signs/symptoms of physical distress.  Continue to progress workloads to maintain intensity without signs/symptoms of physical distress.   Average METs  -  3.58  2.4  3.77  3.77     Resistance Training   Training Prescription  -  Yes  Yes  Yes  Yes   Weight  -  4 lbs  4 lb  4 lb  4 lb   Reps  -  10-15  10-15  10-15  10-15     Interval Training   Interval Training  -  No  -  No  No     Treadmill   MPH  -  2.8  2.8  2.8  2.8   Grade  -  0.5  0.5  0.5  0.5   Minutes  -  15  15  15  15    METs  -  3.34  3.34  3.34  3.34     REL-XR   Level  -  2  -  4  4   Speed  -  77  -  -  -   Minutes  -  15  -  15  15   METs  -  5.5  -  4.2  4.2     T5 Nustep   Level  -  2  2  -  -   SPM  -  69  80  -  -   Minutes  -  15  15  -  -   METs  -  1.9  1.9  -  -     Home Exercise Plan   Plans to continue exercise at  -  -  -  [employer] (comment) [employer]   Frequency  -  -  -  -  Add 1 additional day to program exercise sessions.   Initial Home Exercises Provided  -  -  -  -  09/11/17   Row Name 09/24/17 0900             Response to Exercise   Blood Pressure (Admit)  114/64       Blood Pressure (Exit)  110/62       Heart Rate (Admit)  75 bpm       Heart Rate (Exercise)  95 bpm       Heart Rate (Exit)  74 bpm       Oxygen Saturation  (Admit)  94 %       Oxygen Saturation (Exercise)  92 %       Oxygen Saturation (Exit)  92 %       Rating of Perceived Exertion (Exercise)  13       Perceived Dyspnea (Exercise)  3       Duration  Continue with 45 min of aerobic exercise without signs/symptoms of physical distress.       Intensity  THRR unchanged         Progression   Progression  Continue to progress workloads to maintain intensity without signs/symptoms of physical distress.       Average METs  3.6         Resistance Training   Training Prescription  Yes       Weight  4 lb       Reps  10-15         Interval Training   Interval Training  No         Treadmill   MPH  2.8       Grade  0.5       Minutes  15       METs  3.34         REL-XR   Level  4       Minutes  15       METs  3.8         T5 Nustep   Level  3  SPM  80       Minutes  15       METs  2.1         Home Exercise Plan   Plans to continue exercise at  [facility] (comment) [employer]       Frequency  Add 1 additional day to program exercise sessions.       Initial Home Exercises Provided  09/11/17          Exercise Comments: Exercise Comments    Row Name 08/10/17 1055 09/11/17 1213         Exercise Comments  First full day of exercise!  Patient was oriented to gym and equipment including functions, settings, policies, and procedures.  Patient's individual exercise prescription and treatment plan were reviewed.  All starting workloads were established based on the results of the 6 minute walk test done at initial orientation visit.  The plan for exercise progression was also introduced and progression will be customized based on patient's performance and goals.  Reviewed home exercise with pt today.  Pt plans to walk and join [employer] for exercise.  Reviewed THR, pulse, RPE, sign and symptoms, NTG use, and when to call 911 or MD.  Also discussed weather considerations and indoor options.  Pt voiced understanding.          Exercise Goals and Review: Exercise Goals    Row Name 08/03/17 1533             Exercise Goals   Increase Physical Activity  Yes       Intervention  Provide advice, education, support and counseling about physical activity/exercise needs.;Develop an individualized exercise prescription for aerobic and resistive training based on initial evaluation findings, risk stratification, comorbidities and participant's personal goals.       Expected Outcomes  Short Term: Attend rehab on a regular basis to increase amount of physical activity.;Long Term: Add in home exercise to make exercise part of routine and to increase amount of physical activity.;Long Term: Exercising regularly at least 3-5 days a week.       Increase Strength and Stamina  Yes       Intervention  Provide advice, education, support and counseling about physical activity/exercise needs.;Develop an individualized exercise prescription for aerobic and resistive training based on initial evaluation findings, risk stratification, comorbidities and participant's personal goals.       Expected Outcomes  Short Term: Increase workloads from initial exercise prescription for resistance, speed, and METs.;Short Term: Perform resistance training exercises routinely during rehab and add in resistance training at home;Long Term: Improve cardiorespiratory fitness, muscular endurance and strength as measured by increased METs and functional capacity (6MWT)       Able to understand and use rate of perceived exertion (RPE) scale  Yes       Intervention  Provide education and explanation on how to use RPE scale       Expected Outcomes  Short Term: Able to use RPE daily in rehab to express subjective intensity level;Long Term:  Able to use RPE to guide intensity level when exercising independently       Able to understand and use Dyspnea scale  Yes       Intervention  Provide education and explanation on how to use Dyspnea scale       Expected  Outcomes  Short Term: Able to use Dyspnea scale daily in rehab to express subjective sense of shortness of breath during exertion;Long Term: Able to use  Dyspnea scale to guide intensity level when exercising independently       Knowledge and understanding of Target Heart Rate Range (THRR)  Yes       Intervention  Provide education and explanation of THRR including how the numbers were predicted and where they are located for reference       Expected Outcomes  Short Term: Able to state/look up THRR;Short Term: Able to use daily as guideline for intensity in rehab;Long Term: Able to use THRR to govern intensity when exercising independently       Able to check pulse independently  Yes       Intervention  Provide education and demonstration on how to check pulse in carotid and radial arteries.;Review the importance of being able to check your own pulse for safety during independent exercise       Expected Outcomes  Short Term: Able to explain why pulse checking is important during independent exercise;Long Term: Able to check pulse independently and accurately       Understanding of Exercise Prescription  Yes       Intervention  Provide education, explanation, and written materials on patient's individual exercise prescription       Expected Outcomes  Short Term: Able to explain program exercise prescription;Long Term: Able to explain home exercise prescription to exercise independently          Exercise Goals Re-Evaluation : Exercise Goals Re-Evaluation    Row Name 08/10/17 1055 08/26/17 1502 09/09/17 1529 09/11/17 1213 09/21/17 1102     Exercise Goal Re-Evaluation   Exercise Goals Review  Understanding of Exercise Prescription;Able to understand and use Dyspnea scale;Knowledge and understanding of Target Heart Rate Range (THRR);Able to understand and use rate of perceived exertion (RPE) scale  Increase Physical Activity;Able to understand and use Dyspnea scale;Increase Strength and Stamina;Knowledge  and understanding of Target Heart Rate Range (THRR);Able to understand and use rate of perceived exertion (RPE) scale  Increase Physical Activity;Able to understand and use rate of perceived exertion (RPE) scale;Increase Strength and Stamina;Able to understand and use Dyspnea scale  Increase Physical Activity;Increase Strength and Stamina;Able to understand and use rate of perceived exertion (RPE) scale;Able to understand and use Dyspnea scale;Knowledge and understanding of Target Heart Rate Range (THRR);Able to check pulse independently;Understanding of Exercise Prescription  Increase Physical Activity;Increase Strength and Stamina;Able to understand and use rate of perceived exertion (RPE) scale;Able to understand and use Dyspnea scale;Knowledge and understanding of Target Heart Rate Range (THRR);Able to check pulse independently;Understanding of Exercise Prescription   Comments  Reviewed RPE scale, THR and program prescription with pt today.  Pt voiced understanding and was given a copy of goals to take home.   Pt is tolerating exercise well.  Staff will monitor progress.  [place]au has made steady increases in MET level.  Staff will monitor progress.  Reviewed home exercise with pt today.  Pt plans to walk and join [employer] for exercise.  Reviewed THR, pulse, RPE, sign and symptoms, NTG use, and when to call 911 or MD.  Also discussed weather considerations and indoor options.  Pt voiced understanding.  Demarius is exercising at home. He reports walking and doing strength training at home 2 days a week on the days he does not come to rehab. He is monitoring HR and oxygen at home during exercise and reports that his oxygen saturations remain above 92%. He reports feeling stronger and being able to do more yard work which he enjoys.    Expected Outcomes  Short: Use RPE daily to regulate intensity.  Long: Follow program prescription in THR.  Short - attend LW and exercise 3 days per week Long - increase overall MET  level  Short - continue to attend regularly Long - increase MET level  [place] will monitor HR and O2 when walking, get info about joining gym [place] will maintain exercise on his own  Short: Continue to walk and strength train at home 2 days a week while monitoring oxygen saturations. Long: Maintain independent exercise routine upon graduation from rehab.     [place] Name 09/24/17 1005             Exercise Goal Re-Evaluation   Exercise Goals Review  Increase Physical Activity;Increase Strength and Stamina;Able to understand and use rate of perceived exertion (RPE) scale;Able to understand and use Dyspnea scale       Comments  Jaydon is tolerating exercise well.  Staff will monitor progress.       Expected Outcomes  Short - attend class consistently Long - increase overall MET level          Discharge Exercise Prescription (Final Exercise Prescription Changes): Exercise Prescription Changes - 09/24/17 0900      Response to Exercise   Blood Pressure (Admit)  114/64    Blood Pressure (Exit)  110/62    Heart Rate (Admit)  75 bpm    Heart Rate (Exercise)  95 bpm    Heart Rate (Exit)  74 bpm    Oxygen Saturation (Admit)  94 %    Oxygen Saturation (Exercise)  92 %    Oxygen Saturation (Exit)  92 %    Rating of Perceived Exertion (Exercise)  13    Perceived Dyspnea (Exercise)  3    Duration  Continue with 45 min of aerobic exercise without signs/symptoms of physical distress.    Intensity  THRR unchanged      Progression   Progression  Continue to progress workloads to maintain intensity without signs/symptoms of physical distress.    Average METs  3.6      Resistance Training   Training Prescription  Yes    Weight  4 lb    Reps  10-15      Interval Training   Interval Training  No      Treadmill   MPH  2.8    Grade  0.5    Minutes  15    METs  3.34      REL-XR   Level  4    Minutes  15    METs  3.8      T5 Nustep   Level  3    SPM  80    Minutes  15    METs   2.1      Home Exercise Plan   Plans to continue exercise at  [employer] (comment)   [place]   Frequency  Add 1 additional day to program exercise sessions.    Initial Home Exercises Provided  09/11/17       Nutrition:  Target Goals: Understanding of nutrition guidelines, daily intake of sodium <1576m, cholesterol <2073m calories 30% from fat and 7% or less from saturated fats, daily to have 5 or more servings of fruits and vegetables.  Biometrics: Pre Biometrics - 08/03/17 1533      Pre Biometrics   Height  5' 10.9" (1.801 m)    Weight  144 lb 14.4 oz (65.7 kg)  Waist Circumference  36 inches    Hip Circumference  37 inches    Waist to Hip Ratio  0.97 %    BMI (Calculated)  20.26    Single Leg Stand  30 seconds        Nutrition Therapy Plan and Nutrition Goals: Nutrition Therapy & Goals - 08/31/17 1125      Nutrition Therapy   Diet  TLC    Protein (specify units)  13oz    Fiber  35 grams    Whole Grain Foods  3 servings    Saturated Fats  16 max. grams    Fruits and Vegetables  5 servings/day   8 ideal   Sodium  2000 grams      Personal Nutrition Goals   Nutrition Goal  Make protein a priority at each meal / snack. Aim for 15g/3oz of protein at meal times    Personal Goal #2  Add fats to meals and use liquid calorie sources such as Boost (continue to drink this daily) to help meet your daily nutritional requirements and prevent weight loss    Personal Goal #3  Consider eating smaller, more frequent "mini meals" to help prevent evening bloating. You may also want to limit the specific foods listed on the handout provided    Comments  He is satisfied with his CBW but does not want to lose more weight. Per his description, portion sizes at meal times are small      Intervention Plan   Intervention  Prescribe, educate and counsel regarding individualized specific dietary modifications aiming towards targeted core components such as weight, hypertension,  lipid management, diabetes, heart failure and other comorbidities.;Nutrition handout(s) given to patient.   Nutrition guidelines for COPD, High calorie/ high protein nutrition therapy    Expected Outcomes  Short Term Goal: Understand basic principles of dietary content, such as calories, fat, sodium, cholesterol and nutrients.;Short Term Goal: A plan has been developed with personal nutrition goals set during dietitian appointment.;Long Term Goal: Adherence to prescribed nutrition plan.       Nutrition Assessments: Nutrition Assessments - 08/03/17 1432      MEDFICTS Scores   Pre Score  82       Nutrition Goals Re-Evaluation: Nutrition Goals Re-Evaluation    Row Name 08/28/17 1038 08/31/17 1132 09/21/17 1119         Goals   Current Weight  146 lb (66.2 kg)  -  149 lb (67.6 kg)     Nutrition Goal  Learn to eat healthier. Maintain weight and gain strength  Add fats to meals and use liquid calorie sources such as Boost (continue to drink this daily) to help meet your daily nutritional requirements and prevent weight loss  Add fats to meals and use liquid calorie sources such as Boost (continue to drink this daily) to help meet your daily nutritional requirements and prevent weight loss     Comment  Makana states that he eats good. He wants to meat with the dietician to see if he is getting enough nutrients.  He does not wish to lose weight. Currently drinks 1 Boost/day which his wife makes into a milkshake or smoothie  Travas reports eating a protein at least 4 times a day (yoghurt, protein bars, boost drink, etc) and is trying to eat more frequent smaller meals. He wants to gain weight and feel stronger and stated that he has recently been able to gain 5 lbs and is feeling better.  Expected Outcome  Short: Meet with the dietician. Long: Adhere to a diet plan recommended by the RD.  He will use the suggestions provided to add calories to meals via fats and protein. He will continue to drink Boost  at least once per day  Short: goal weight 155 lbs. Long: maintian healthy eating habbits to help manage weight and energy levels.        Personal Goal #2 Re-Evaluation   Personal Goal #2  -  Make protein a priority at each meal and snack. Aim for at least 15g/3oz of protein at meal times  Make protein a priority at each meal and snack. Aim for at least 15g/3oz of protein at meal times       Personal Goal #3 Re-Evaluation   Personal Goal #3  -  Consider eating smaller, more frequent "mini meals" to help prevent evening bloating. You may also want to limit the specific foods listed on the handout provided  Consider eating smaller, more frequent "mini meals" to help prevent evening bloating. You may also want to limit the specific foods listed on the handout provided        Nutrition Goals Discharge (Final Nutrition Goals Re-Evaluation): Nutrition Goals Re-Evaluation - 09/21/17 1119      Goals   Current Weight  149 lb (67.6 kg)    Nutrition Goal  Add fats to meals and use liquid calorie sources such as Boost (continue to drink this daily) to help meet your daily nutritional requirements and prevent weight loss    Comment  Ebert reports eating a protein at least 4 times a day (yoghurt, protein bars, boost drink, etc) and is trying to eat more frequent smaller meals. He wants to gain weight and feel stronger and stated that he has recently been able to gain 5 lbs and is feeling better.     Expected Outcome  Short: goal weight 155 lbs. Long: maintian healthy eating habbits to help manage weight and energy levels.       Personal Goal #2 Re-Evaluation   Personal Goal #2  Make protein a priority at each meal and snack. Aim for at least 15g/3oz of protein at meal times      Personal Goal #3 Re-Evaluation   Personal Goal #3  Consider eating smaller, more frequent "mini meals" to help prevent evening bloating. You may also want to limit the specific foods listed on the handout provided        Psychosocial: Target Goals: Acknowledge presence or absence of significant depression and/or stress, maximize coping skills, provide positive support system. Participant is able to verbalize types and ability to use techniques and skills needed for reducing stress and depression.   Initial Review & Psychosocial Screening: Initial Psych Review & Screening - 08/03/17 1429      Initial Review   Current issues with  Current Stress Concerns    Source of Stress Concerns  Chronic Illness    Comments  COPD is his main concern with his stress.      Family Dynamics   Good Support System?  Yes    Comments  He can look to his wife, dog and two childeren for support.      Barriers   Psychosocial barriers to participate in program  The patient should benefit from training in stress management and relaxation.      Screening Interventions   Interventions  To provide support and resources with identified psychosocial needs;Encouraged to exercise;Program counselor consult;Provide feedback about the  scores to participant    Expected Outcomes  Short Term goal: Utilizing psychosocial counselor, staff and physician to assist with identification of specific Stressors or current issues interfering with healing process. Setting desired goal for each stressor or current issue identified.;Long Term Goal: Stressors or current issues are controlled or eliminated.;Short Term goal: Identification and review with participant of any Quality of Life or Depression concerns found by scoring the questionnaire.;Long Term goal: The participant improves quality of Life and PHQ9 Scores as seen by post scores and/or verbalization of changes       Quality of Life Scores:  Scores of 19 and below usually indicate a poorer quality of life in these areas.  A difference of  2-3 points is a clinically meaningful difference.  A difference of 2-3 points in the total score of the Quality of Life Index has been associated with  significant improvement in overall quality of life, self-image, physical symptoms, and general health in studies assessing change in quality of life.  PHQ-9: Recent Review Flowsheet Data    Depression screen [facility] 2/9 08/03/2017 04/16/2017 04/16/2017 06/17/2016 04/30/2015   Decreased Interest 1 0 0 0 0   Down, Depressed, Hopeless - 0 0 0 0   PHQ - 2 Score 1 0 0 0 0   Altered sleeping 0 0 - - -   Tired, decreased energy 2 0 - - -   Change in appetite 1 0 - - -   Feeling bad or failure about yourself  1 0 - - -   Trouble concentrating 0 0 - - -   Moving slowly or fidgety/restless 0 0 - - -   Suicidal thoughts 0 0 - - -   PHQ-9 Score 5 0 - - -   Difficult doing work/chores Somewhat difficult Not difficult at all - - -     Interpretation of Total Score  Total Score Depression Severity:  1-4 = Minimal depression, 5-9 = Mild depression, 10-14 = Moderate depression, 15-19 = Moderately severe depression, 20-27 = Severe depression   Psychosocial Evaluation and Intervention: Psychosocial Evaluation - 08/24/17 1049      Psychosocial Evaluation & Interventions   Interventions  Encouraged to exercise with the program and follow exercise prescription    Comments  Counselor met with Mr. Wafer [facility]) today for initial psychosocial evaluation.  He is a 72 year old who has a diagnosis of COPD.  He has a strong support system with a spouse of 57 years; several adult children who live locally and active involvement in a local church.  Braxdon sleeps "okay" with about 5 hours most nights.  He has a good appetite and denies a history of depression or anxiety of any current symptoms.  Kacper states he is typically in a positive mood most of the time and was quite jovial today in this assessment.  He reports minimal stress in his life - other than his health at this time.  Jong has goals to get back on the golf course of 18 holes and breathe better - with more energy while in this program.  Staff will follow with him.       Expected Outcomes  Short:  Jourden will exercise consistently to increase his energy and breathe better.  He will attend the educational components of this program to understand and manage his condition more positively.  Long:  Clinton will consistently exercise and practice the skills he learns in order to get back on the golf course for 18  one day!    Continue Psychosocial Services   Follow up required by staff       Psychosocial Re-Evaluation: Psychosocial Re-Evaluation    [place] Name 09/21/17 1115             Psychosocial Re-Evaluation   Current issues with  Current Stress Concerns health concerns       Comments  Bowman reports no new stressors or changes in his psychosocial well-being. He did report feeling like he can do more, specifically yard work, which he enjoys. He is wanting to get back to golf. He has not yet but is working on it.        Expected Outcomes  Short: Get back to golf game and other activities that he enjoys to do. Long: Maintain healthy balance with life stresses.        Continue Psychosocial Services   Follow up required by staff          Psychosocial Discharge (Final Psychosocial Re-Evaluation): Psychosocial Re-Evaluation - 09/21/17 1115      Psychosocial Re-Evaluation   Current issues with  Current Stress Concerns   health concerns   Comments  Joie reports no new stressors or changes in his psychosocial well-being. He did report feeling like he can do more, specifically yard work, which he enjoys. He is wanting to get back to golf. He has not yet but is working on it.     Expected Outcomes  Short: Get back to golf game and other activities that he enjoys to do. Long: Maintain healthy balance with life stresses.     Continue Psychosocial Services   Follow up required by staff       Education: Education Goals: Education classes will be provided on a weekly basis, covering required topics. Participant will state understanding/return demonstration of topics  presented.  Learning Barriers/Preferences: Learning Barriers/Preferences - 08/03/17 1438      Learning Barriers/Preferences   Learning Barriers  Sight   prescription do   Learning Preferences  None       Education Topics:  Initial Evaluation Education: - Verbal, written and demonstration of respiratory meds, oximetry and breathing techniques. Instruction on use of nebulizers and MDIs and importance of monitoring MDI activations.   Pulmonary Rehab from 09/25/2017 in [facility] Cardiac and Pulmonary Rehab  Date  08/03/17  Educator  [facility]  Instruction Review Code  1- Verbalizes Understanding      General Nutrition Guidelines/Fats and Fiber: -Group instruction provided by verbal, written material, models and posters to present the general guidelines for heart healthy nutrition. Gives an explanation and review of dietary fats and fiber.   Pulmonary Rehab from 09/25/2017 in [facility] Cardiac and Pulmonary Rehab  Date  08/24/17  Educator  CR  Instruction Review Code  1- Verbalizes Understanding      Controlling Sodium/Reading Food Labels: -Group verbal and written material supporting the discussion of sodium use in heart healthy nutrition. Review and explanation with models, verbal and written materials for utilization of the food label.   Pulmonary Rehab from 09/25/2017 in [facility] Cardiac and Pulmonary Rehab  Date  08/31/17  Educator  CR  Instruction Review Code  1- Verbalizes Understanding      Exercise Physiology & General Exercise Guidelines: - Group verbal and written instruction with models to review the exercise physiology of the cardiovascular system and associated critical values. Provides general exercise guidelines with specific guidelines to those with heart or lung disease.    Aerobic Exercise & Resistance Training: - Gives  group verbal and written instruction on the various components of exercise. Focuses on aerobic and resistive training programs and the benefits of this training  and how to safely progress through these programs.   Flexibility, Balance, Mind/Body Relaxation: Provides group verbal/written instruction on the benefits of flexibility and balance training, including mind/body exercise modes such as yoga, pilates and tai chi.  Demonstration and skill practice provided.   Pulmonary Rehab from 09/25/2017 in [facility] Cardiac and Pulmonary Rehab  Date  09/11/17  Educator  AS  Instruction Review Code  1- Verbalizes Understanding      Stress and Anxiety: - Provides group verbal and written instruction about the health risks of elevated stress and causes of high stress.  Discuss the correlation between heart/lung disease and anxiety and treatment options. Review healthy ways to manage with stress and anxiety.   Depression: - Provides group verbal and written instruction on the correlation between heart/lung disease and depressed mood, treatment options, and the stigmas associated with seeking treatment.   Exercise & Equipment Safety: - Individual verbal instruction and demonstration of equipment use and safety with use of the equipment.   Pulmonary Rehab from 09/25/2017 in [facility] Cardiac and Pulmonary Rehab  Date  08/03/17  Educator  [facility]  Instruction Review Code  1- Verbalizes Understanding      Infection Prevention: - Provides verbal and written material to individual with discussion of infection control including proper hand washing and proper equipment cleaning during exercise session.   Pulmonary Rehab from 09/25/2017 in [facility] Cardiac and Pulmonary Rehab  Date  08/03/17  Educator  [facility]  Instruction Review Code  1- Verbalizes Understanding      Falls Prevention: - Provides verbal and written material to individual with discussion of falls prevention and safety.   Pulmonary Rehab from 09/25/2017 in [facility] Cardiac and Pulmonary Rehab  Date  08/03/17  Educator  [facility]  Instruction Review Code  1- Verbalizes Understanding      Diabetes: - Individual verbal and  written instruction to review signs/symptoms of diabetes, desired ranges of glucose level fasting, after meals and with exercise. Advice that pre and post exercise glucose checks will be done for 3 sessions at entry of program.   Chronic Lung Diseases: - Group verbal and written instruction to review updates, respiratory medications, advancements in procedures and treatments. Discuss use of supplemental oxygen including available portable oxygen systems, continuous and intermittent flow rates, concentrators, personal use and safety guidelines. Review proper use of inhaler and spacers. Provide informative websites for self-education.    Energy Conservation: - Provide group verbal and written instruction for methods to conserve energy, plan and organize activities. Instruct on pacing techniques, use of adaptive equipment and posture/positioning to relieve shortness of breath.   Triggers and Exacerbations: - Group verbal and written instruction to review types of environmental triggers and ways to prevent exacerbations. Discuss weather changes, air quality and the benefits of nasal washing. Review warning signs and symptoms to help prevent infections. Discuss techniques for effective airway clearance, coughing, and vibrations.   AED/CPR: - Group verbal and written instruction with the use of models to demonstrate the basic use of the AED with the basic ABC's of resuscitation.   Pulmonary Rehab from 09/25/2017 in [facility] Cardiac and Pulmonary Rehab  Date  09/25/17  Educator  [facility]  Instruction Review Code  1- Actuary and Physiology of the Lungs: - Group verbal and written instruction with the use of models to provide basic  lung anatomy and physiology related to function, structure and complications of lung disease.   Anatomy & Physiology of the Heart: - Group verbal and written instruction and models provide basic cardiac anatomy and physiology, with the coronary  electrical and arterial systems. Review of Valvular disease and Heart Failure   Cardiac Medications: - Group verbal and written instruction to review commonly prescribed medications for heart disease. Reviews the medication, class of the drug, and side effects.   Know Your Numbers and Risk Factors: -Group verbal and written instruction about important numbers in your health.  Discussion of what are risk factors and how they play a role in the disease process.  Review of Cholesterol, Blood Pressure, Diabetes, and BMI and the role they play in your overall health.   Sleep Hygiene: -Provides group verbal and written instruction about how sleep can affect your health.  Define sleep hygiene, discuss sleep cycles and impact of sleep habits. Review good sleep hygiene tips.    Other: -Provides group and verbal instruction on various topics (see comments)    Knowledge Questionnaire Score: Knowledge Questionnaire Score - 08/03/17 1436      Knowledge Questionnaire Score   Pre Score  13/18   reviewed with patient       Core Components/Risk Factors/Patient Goals at Admission: Personal Goals and Risk Factors at Admission - 08/03/17 1443      Core Components/Risk Factors/Patient Goals on Admission    Weight Management  Yes;Weight Maintenance    Intervention  Weight Management: Develop a combined nutrition and exercise program designed to reach desired caloric intake, while maintaining appropriate intake of nutrient and fiber, sodium and fats, and appropriate energy expenditure required for the weight goal.;Weight Management: Provide education and appropriate resources to help participant work on and attain dietary goals.;Weight Management/Obesity: Establish reasonable short term and long term weight goals.    Admit Weight  144 lb 14.4 oz (65.7 kg)    Goal Weight: Short Term  145 lb (65.8 kg)    Goal Weight: Long Term  145 lb (65.8 kg)    Expected Outcomes  Short Term: Continue to assess and  modify interventions until short term weight is achieved;Weight Maintenance: Understanding of the daily nutrition guidelines, which includes 25-35% calories from fat, 7% or less cal from saturated fats, less than 271m cholesterol, less than 1.5gm of sodium, & 5 or more servings of fruits and vegetables daily;Long Term: Adherence to nutrition and physical activity/exercise program aimed toward attainment of established weight goal;Understanding recommendations for meals to include 15-35% energy as protein, 25-35% energy from fat, 35-60% energy from carbohydrates, less than 2029mof dietary cholesterol, 20-35 gm of total fiber daily;Understanding of distribution of calorie intake throughout the day with the consumption of 4-5 meals/snacks    Improve shortness of breath with ADL's  Yes    Intervention  Provide education, individualized exercise plan and daily activity instruction to help decrease symptoms of SOB with activities of daily living.    Expected Outcomes  Short Term: Improve cardiorespiratory fitness to achieve a reduction of symptoms when performing ADLs;Long Term: Be able to perform more ADLs without symptoms or delay the onset of symptoms       Core Components/Risk Factors/Patient Goals Review:  Goals and Risk Factor Review    Row Name 08/28/17 1038 09/21/17 1111           Core Components/Risk Factors/Patient Goals Review   Personal Goals Review  Weight Management/Obesity;Improve shortness of breath with ADL's  Weight Management/Obesity;Improve shortness  of breath with ADL's      Review  Mar has maintained his weight since he started the program. His ADLs have been improving at home. He cannot work in the yar if it is to hot or humid.  Vic reports taking all meds. Markie wants to gain weight and feel stronger. He has recently gained 5 lbs and stated he is able to do more around the house as he feels stronger. He has been able to do some more yard work but is still careful about being out  in the heat of the day. He stated that he feels like he is breathing better and it is not limiting him as much as it used to.       Expected Outcomes  Short: Attend [facility] regularly to improve shortness of breath with ADL's. Long: maintain independence with ADL's   Short: Attend [facility] regularly to improve shortness of breath with ADL's. Continue to take all medications and exercise to gain strength. Weight goal 155 lbs.  Long: maintain independence with ADL's          Core Components/Risk Factors/Patient Goals at Discharge (Final Review):  Goals and Risk Factor Review - 09/21/17 1111      Core Components/Risk Factors/Patient Goals Review   Personal Goals Review  Weight Management/Obesity;Improve shortness of breath with ADL's    Review  Dianne reports taking all meds. [place] wants to gain weight and feel stronger. He has recently gained 5 lbs and stated he is able to do more around the house as he feels stronger. He has been able to do some more yard work but is still careful about being out in the heat of the day. He stated that he feels like he is breathing better and it is not limiting him as much as it used to.     Expected Outcomes  Short: Attend [facility] regularly to improve shortness of breath with ADL's. Continue to take all medications and exercise to gain strength. Weight goal 155 lbs.  Long: maintain independence with ADL's        ITP Comments: ITP Comments    Row Name 08/03/17 1358 08/10/17 0947 09/07/17 0849 10/05/17 0823     ITP Comments  Medical Evaluation completed. Chart sent for review and changes to Dr. Emily Filbert Director of [place]. Diagnosis can be found in [facility] encounter 06/22/17  30 day review completed. ITP sent to Dr. Emily Filbert Director of [place]. Continue with ITP unless changes are made by physician  30 day review completed. ITP sent to Dr. Emily Filbert Director of [place]. Continue with ITP unless changes are made by physician  30 day review completed. ITP  sent to Dr. Emily Filbert Director of [place]. Continue with ITP unless changes are made by physician       Comments: 30 day review

## 2017-10-06 ENCOUNTER — Ambulatory Visit: Payer: Self-pay | Admitting: Pulmonary Disease

## 2017-10-07 ENCOUNTER — Telehealth: Payer: Self-pay

## 2017-10-07 NOTE — Telephone Encounter (Signed)
LMOM

## 2017-10-14 ENCOUNTER — Encounter: Payer: PPO | Attending: Pulmonary Disease

## 2017-10-14 DIAGNOSIS — Z87891 Personal history of nicotine dependence: Secondary | ICD-10-CM | POA: Insufficient documentation

## 2017-10-14 DIAGNOSIS — Z8701 Personal history of pneumonia (recurrent): Secondary | ICD-10-CM | POA: Insufficient documentation

## 2017-10-14 DIAGNOSIS — J449 Chronic obstructive pulmonary disease, unspecified: Secondary | ICD-10-CM | POA: Insufficient documentation

## 2017-10-14 DIAGNOSIS — Z7951 Long term (current) use of inhaled steroids: Secondary | ICD-10-CM | POA: Insufficient documentation

## 2017-10-14 DIAGNOSIS — Z7982 Long term (current) use of aspirin: Secondary | ICD-10-CM | POA: Insufficient documentation

## 2017-10-14 DIAGNOSIS — Z79899 Other long term (current) drug therapy: Secondary | ICD-10-CM | POA: Insufficient documentation

## 2017-10-14 DIAGNOSIS — K5190 Ulcerative colitis, unspecified, without complications: Secondary | ICD-10-CM | POA: Insufficient documentation

## 2017-10-16 ENCOUNTER — Telehealth: Payer: Self-pay

## 2017-10-16 DIAGNOSIS — J449 Chronic obstructive pulmonary disease, unspecified: Secondary | ICD-10-CM

## 2017-10-16 NOTE — Telephone Encounter (Signed)
Patient states he has been tied up and will return to [place] on Monday 10/19/17.

## 2017-10-19 ENCOUNTER — Encounter: Payer: PPO | Admitting: *Deleted

## 2017-10-19 DIAGNOSIS — K5190 Ulcerative colitis, unspecified, without complications: Secondary | ICD-10-CM | POA: Diagnosis not present

## 2017-10-19 DIAGNOSIS — Z7951 Long term (current) use of inhaled steroids: Secondary | ICD-10-CM | POA: Diagnosis not present

## 2017-10-19 DIAGNOSIS — Z8701 Personal history of pneumonia (recurrent): Secondary | ICD-10-CM | POA: Diagnosis not present

## 2017-10-19 DIAGNOSIS — Z7982 Long term (current) use of aspirin: Secondary | ICD-10-CM | POA: Diagnosis not present

## 2017-10-19 DIAGNOSIS — J449 Chronic obstructive pulmonary disease, unspecified: Secondary | ICD-10-CM

## 2017-10-19 DIAGNOSIS — Z79899 Other long term (current) drug therapy: Secondary | ICD-10-CM | POA: Diagnosis not present

## 2017-10-19 DIAGNOSIS — Z87891 Personal history of nicotine dependence: Secondary | ICD-10-CM | POA: Diagnosis not present

## 2017-10-19 NOTE — Progress Notes (Signed)
Daily Session Note  Patient Details  Name: Tayvin Preslar MRN: [id number] Date of Birth: December 01, 1945 Referring Provider:     Pulmonary Rehab from 08/03/2017 in [facility] Cardiac and Pulmonary Rehab  Referring Provider  Simonne Maffucci MD      Encounter Date: 10/19/2017  Check In: Session Check In - 10/19/17 1051      Check-In   Supervising physician immediately available to respond to emergencies  [facility] immediately available ER MD    Physician(s)  Dr. Kerman Passey and Dr. Joni Fears    Location  [facility]-Cardiac & Pulmonary Rehab    Staff Present  Earlean Shawl, BS, ACSM CEP, Exercise Physiologist;Amanda Oletta Darter, BA, ACSM CEP, Exercise Physiologist;Joseph Tessie Fass RCP,RRT,BSRT    Medication changes reported      No    Fall or balance concerns reported     No    Tobacco Cessation  No Change    Warm-up and Cool-down  Performed as group-led instruction    Resistance Training Performed  Yes    VAD Patient?  No    PAD/SET Patient?  No      Pain Assessment   Currently in Pain?  No/denies    Multiple Pain Sites  No          Social History   Tobacco Use  Smoking Status Former Smoker  . Packs/day: 0.75  . Years: 35.00  . Pack years: 26.25  . Types: Cigarettes, Cigars  . Last attempt to quit: 06/11/2011  . Years since quitting: 6.3  Smokeless Tobacco Never Used  Tobacco Comment   smoking cessation materials not required    Goals Met:  Proper associated with RPD/PD & O2 Sat Independence with exercise equipment Exercise tolerated well No report of cardiac concerns or symptoms Strength training completed today  Goals Unmet:  Not Applicable  Comments: Pt able to follow exercise prescription today without complaint.  Will continue to monitor for progression.    Dr. Emily Filbert is Medical Director for [place] and [facility].

## 2017-10-26 ENCOUNTER — Encounter: Payer: PPO | Admitting: *Deleted

## 2017-10-26 DIAGNOSIS — J449 Chronic obstructive pulmonary disease, unspecified: Secondary | ICD-10-CM | POA: Diagnosis not present

## 2017-10-26 NOTE — Progress Notes (Signed)
Daily Session Note  Patient Details  Name: Antonio [name] MRN: [id number] Date of Birth: September 27, 1945 Referring Provider:     Pulmonary Rehab from 08/03/2017 in [facility] Cardiac and Pulmonary Rehab  Referring Provider  Simonne Maffucci MD      Encounter Date: 10/26/2017  Check In: Session Check In - 10/26/17 1023      Check-In   Supervising physician immediately available to respond to emergencies  [facility] immediately available ER MD    Physician(s)  Dr. Mariea Clonts and Dr. Corky Downs    Location  [facility]-Cardiac & Pulmonary Rehab    Staff Present  Justin Mend RCP,RRT,BSRT;Amanda Oletta Darter, [place], ACSM CEP, Exercise Physiologist;Altamese Deguire Amedeo Plenty, BS, ACSM CEP, Exercise Physiologist    Medication changes reported      No    Fall or balance concerns reported     No    Tobacco Cessation  No Change    Warm-up and Cool-down  Performed as group-led instruction    Resistance Training Performed  Yes    VAD Patient?  No    PAD/SET Patient?  No      Pain Assessment   Currently in Pain?  No/denies    Multiple Pain Sites  No          Social History   Tobacco Use  Smoking Status Former Smoker  . Packs/day: 0.75  . Years: 35.00  . Pack years: 26.25  . Types: Cigarettes, Cigars  . Last attempt to quit: 06/11/2011  . Years since quitting: 6.3  Smokeless Tobacco Never Used  Tobacco Comment   smoking cessation materials not required    Goals Met:  Proper associated with RPD/PD & O2 Sat Independence with exercise equipment Exercise tolerated well No report of cardiac concerns or symptoms Strength training completed today  Goals Unmet:  Not Applicable  Comments: Pt able to follow exercise prescription today without complaint.  Will continue to monitor for progression.    Dr. Emily Filbert is Medical Director for [place] and [facility].

## 2017-10-30 DIAGNOSIS — J449 Chronic obstructive pulmonary disease, unspecified: Secondary | ICD-10-CM | POA: Diagnosis not present

## 2017-10-30 NOTE — Progress Notes (Signed)
Daily Session Note  Patient Details  Name: Antonio [name] MRN: [id number] Date of Birth: [birth year] Referring Provider:     Pulmonary Rehab from 08/03/2017 in [facility] Cardiac and Pulmonary Rehab  Referring Provider  Simonne Maffucci MD      Encounter Date: 10/30/2017  Check In: Session Check In - 10/30/17 1021      Check-In   Supervising physician immediately available to respond to emergencies  [facility] immediately available ER MD    Physician(s)  Dr. Archie Balboa and Quentin Cornwall    Location  [facility]-Cardiac & Pulmonary Rehab    Staff Present  Justin Mend RCP,RRT,BSRT;Amanda Oletta Darter, BA, ACSM CEP, Exercise Physiologist;Meredith Sherryll Burger, RN BSN    Medication changes reported      No    Fall or balance concerns reported     No    Warm-up and Cool-down  Performed as group-led Higher education careers adviser Performed  Yes    VAD Patient?  No      Pain Assessment   Currently in Pain?  No/denies          Social History   Tobacco Use  Smoking Status Former Smoker  . Packs/day: 0.75  . Years: 35.00  . Pack years: 26.25  . Types: Cigarettes, Cigars  . Last attempt to quit: 06/11/2011  . Years since quitting: 6.3  Smokeless Tobacco Never Used  Tobacco Comment   smoking cessation materials not required    Goals Met:  Independence with exercise equipment Exercise tolerated well No report of cardiac concerns or symptoms Strength training completed today  Goals Unmet:  Not Applicable  Comments: Pt able to follow exercise prescription today without complaint.  Will continue to monitor for progression.    Dr. Emily Filbert is Medical Director for [place] and [facility].

## 2017-11-02 ENCOUNTER — Encounter: Payer: Self-pay | Admitting: Family Medicine

## 2017-11-02 ENCOUNTER — Ambulatory Visit (INDEPENDENT_AMBULATORY_CARE_PROVIDER_SITE_OTHER): Payer: PPO | Admitting: Family Medicine

## 2017-11-02 VITALS — BP 122/62 | HR 72 | Ht 70.0 in | Wt 149.0 lb

## 2017-11-02 DIAGNOSIS — J449 Chronic obstructive pulmonary disease, unspecified: Secondary | ICD-10-CM

## 2017-11-02 DIAGNOSIS — H1033 Unspecified acute conjunctivitis, bilateral: Secondary | ICD-10-CM | POA: Diagnosis not present

## 2017-11-02 MED ORDER — SULFACETAMIDE SODIUM 10 % OP SOLN: 1.0000 [drp] | OPHTHALMIC | 0 refills | Status: DC

## 2017-11-02 NOTE — Progress Notes (Signed)
Pulmonary Individual Treatment Plan  Patient Details  Name: Uvaldo Rybacki MRN: [id number] Date of Birth: 05/16/1945 Referring Provider:     Pulmonary Rehab from 08/03/2017 in [name] Cardiac and Pulmonary Rehab  Referring Provider  Simonne Maffucci MD      Initial Encounter Date:    Pulmonary Rehab from 08/03/2017 in [facility] Cardiac and Pulmonary Rehab  Date  08/03/17      Visit Diagnosis: Chronic obstructive pulmonary disease, unspecified COPD type ([place])  Patient's Home Medications on Admission:  Current Outpatient Medications:  .  albuterol (PROVENTIL HFA;VENTOLIN HFA) 108 (90 Base) MCG/ACT inhaler, Inhale 2 puffs into the lungs every 6 (six) hours as needed for wheezing or shortness of breath., Disp: 3 Inhaler, Rfl: 3 .  albuterol (PROVENTIL) (2.5 MG/3ML) 0.083% nebulizer solution, Take 3 mLs (2.5 mg total) by nebulization every 6 (six) hours as needed for wheezing or shortness of breath., Disp: 75 mL, Rfl: 12 .  budesonide-formoterol (SYMBICORT) 160-4.5 MCG/ACT inhaler, Inhale 2 puffs into the lungs 2 (two) times daily., Disp: , Rfl:  .  EPINEPHrine 0.3 mg/0.3 mL IJ SOAJ injection, Inject 0.3 mLs (0.3 mg total) into the muscle once., Disp: 1 Device, Rfl: 1 .  fluticasone (FLONASE) 50 MCG/ACT nasal spray, Place 2 sprays into both nostrils daily., Disp: 16 g, Rfl: 6 .  loperamide (IMODIUM) 2 MG capsule, Take 2 mg by mouth daily as needed for diarrhea or loose stools. , Disp: , Rfl:  .  mesalamine (LIALDA) 1.2 g EC tablet, Take 4 tablets (4.8 g total) by mouth daily with breakfast., Disp: 360 tablet, Rfl: 2 .  omeprazole (PRILOSEC) 20 MG capsule, , Disp: , Rfl:  .  Respiratory Therapy Supplies (FLUTTER) DEVI, Use as directed, Disp: 1 each, Rfl: 0 .  Tiotropium Bromide Monohydrate (SPIRIVA RESPIMAT) 2.5 MCG/ACT AERS, Inhale 2 puffs into the lungs daily., Disp: 1 Inhaler, Rfl: 0  Current Facility-Administered Medications:  .  albuterol (PROVENTIL) (2.5 MG/3ML) 0.083% nebulizer  solution 2.5 mg, 2.5 mg, Nebulization, Once, [name], MD  Past Medical History: Past Medical History:  Diagnosis Date  . Allergy   . Calf pain   . COPD (chronic obstructive pulmonary disease) ([place])   . Dyspnea   . Hypokalemia   . Internal hemorrhoids   . Muscle cramp   . Muscle pain   . Pneumonia   . PONV (postoperative nausea and vomiting)   . Tubular adenoma of colon   . Ulcerative colitis ([place])     Tobacco Use: Social History   Tobacco Use  Smoking Status Former Smoker  . Packs/day: 0.75  . Years: 35.00  . Pack years: 26.25  . Types: Cigarettes, Cigars  . Last attempt to quit: 06/11/2011  . Years since quitting: 6.4  Smokeless Tobacco Never Used  Tobacco Comment   smoking cessation materials not required    Labs: Recent Review Flowsheet Data    Labs for ITP Cardiac and Pulmonary Rehab Latest Ref Rng & Units 11/08/2014 12/01/2016   Cholestrol 100 - 199 mg/dL 154 151   LDLCALC 0 - 99 mg/dL 89 91   HDL >39 mg/dL 53 50   Trlycerides 0 - 149 mg/dL 61 51       Pulmonary Assessment Scores: Pulmonary Assessment Scores    Row Name 08/03/17 1435         ADL UCSD   ADL Phase  Entry     SOB Score total  45     Rest  0  Walk  3     Stairs  5     Bath  1     Dress  2     Shop  3       CAT Score   CAT Score  13       mMRC Score   mMRC Score  2        Pulmonary Function Assessment: Pulmonary Function Assessment - 08/03/17 1436      Pulmonary Function Tests   FVC%  59 %   test performed on 06/07/14   FEV1%  40 %    FEV1/FVC Ratio  51      Breath   Bilateral Breath Sounds  Clear    Shortness of Breath  Limiting activity;Yes       Exercise Target Goals: Exercise Program Goal: Individual exercise prescription set using results from initial 6 min walk test and THRR while considering  patient's activity barriers and safety.   Exercise Prescription Goal: Initial exercise prescription builds to 30-45 minutes a day of aerobic activity, 2-3  days per week.  Home exercise guidelines will be given to patient during program as part of exercise prescription that the participant will acknowledge.  Activity Barriers & Risk Stratification: Activity Barriers & Cardiac Risk Stratification - 08/03/17 1531      Activity Barriers & Cardiac Risk Stratification   Activity Barriers  Deconditioning;Shortness of Breath       6 Minute Walk: 6 Minute Walk    Row Name 08/03/17 1528         6 Minute Walk   Phase  Initial     Distance  1515 feet     Walk Time  6 minutes     # of Rest Breaks  0     MPH  2.87     METS  3.44     RPE  12     Perceived Dyspnea   3     VO2 Peak  12.42     Symptoms  Yes (comment)     Comments  SOB, cramp in left calf     Resting HR  63 bpm     Resting BP  134/60     Resting Oxygen Saturation   99 %     Exercise Oxygen Saturation  during 6 min walk  85 %     Max Ex. HR  74 bpm     Max Ex. BP  150/64     2 Minute Post BP  142/60       Interval HR   1 Minute HR  69     2 Minute HR  67     3 Minute HR  68     4 Minute HR  69     5 Minute HR  68     6 Minute HR  84     2 Minute Post HR  75     Interval Heart Rate?  Yes       Interval Oxygen   Interval Oxygen?  Yes     Baseline Oxygen Saturation %  99 %     1 Minute Oxygen Saturation %  95 %     1 Minute Liters of Oxygen  0 L Room Air     2 Minute Oxygen Saturation %  90 %     2 Minute Liters of Oxygen  0 L     3 Minute Oxygen Saturation %  93 %  3 Minute Liters of Oxygen  0 L     4 Minute Oxygen Saturation %  87 %     4 Minute Liters of Oxygen  0 L     5 Minute Oxygen Saturation %  89 %     5 Minute Liters of Oxygen  0 L     6 Minute Oxygen Saturation %  85 %     6 Minute Liters of Oxygen  0 L     2 Minute Post Oxygen Saturation %  97 %     2 Minute Post Liters of Oxygen  0 L       Oxygen Initial Assessment: Oxygen Initial Assessment - 08/03/17 1439      Home Oxygen   Home Oxygen Device  None    Sleep Oxygen Prescription  None     Home Exercise Oxygen Prescription  None    Home at Rest Exercise Oxygen Prescription  None      Initial 6 min Walk   Oxygen Used  None      Program Oxygen Prescription   Program Oxygen Prescription  None      Intervention   Short Term Goals  To learn and demonstrate proper use of respiratory medications;To learn and demonstrate proper pursed lip breathing techniques or other breathing techniques.;To learn and understand importance of maintaining oxygen saturations>88%;To learn and understand importance of monitoring SPO2 with pulse oximeter and demonstrate accurate use of the pulse oximeter.    Long  Term Goals  Verbalizes importance of monitoring SPO2 with pulse oximeter and return demonstration;Maintenance of O2 saturations>88%;Exhibits proper breathing techniques, such as pursed lip breathing or other method taught during program session;Demonstrates proper use of MDI's;Compliance with respiratory medication       Oxygen Re-Evaluation: Oxygen Re-Evaluation    Row Name 08/10/17 1055 08/28/17 1041 09/21/17 1108 10/19/17 1658       Program Oxygen Prescription   Program Oxygen Prescription  None  None  None  None      Home Oxygen   Home Oxygen Device  None  None  None  None    Sleep Oxygen Prescription  None  None  None  None    Home Exercise Oxygen Prescription  None  None  None  None    Home at Rest Exercise Oxygen Prescription  None  None  None  None      Goals/Expected Outcomes   Short Term Goals  To learn and demonstrate proper use of respiratory medications;To learn and demonstrate proper pursed lip breathing techniques or other breathing techniques.;To learn and understand importance of maintaining oxygen saturations>88%;To learn and understand importance of monitoring SPO2 with pulse oximeter and demonstrate accurate use of the pulse oximeter.  To learn and demonstrate proper use of respiratory medications;To learn and demonstrate proper pursed lip breathing techniques or other  breathing techniques.;To learn and understand importance of maintaining oxygen saturations>88%;To learn and understand importance of monitoring SPO2 with pulse oximeter and demonstrate accurate use of the pulse oximeter.  To learn and demonstrate proper use of respiratory medications;To learn and demonstrate proper pursed lip breathing techniques or other breathing techniques.;To learn and understand importance of maintaining oxygen saturations>88%;To learn and understand importance of monitoring SPO2 with pulse oximeter and demonstrate accurate use of the pulse oximeter.  To learn and demonstrate proper use of respiratory medications;To learn and demonstrate proper pursed lip breathing techniques or other breathing techniques.;To learn and understand importance of maintaining oxygen saturations>88%;To learn and understand importance of  monitoring SPO2 with pulse oximeter and demonstrate accurate use of the pulse oximeter.    Long  Term Goals  Verbalizes importance of monitoring SPO2 with pulse oximeter and return demonstration;Maintenance of O2 saturations>88%;Exhibits proper breathing techniques, such as pursed lip breathing or other method taught during program session;Demonstrates proper use of MDI's;Compliance with respiratory medication  Verbalizes importance of monitoring SPO2 with pulse oximeter and return demonstration;Maintenance of O2 saturations>88%;Exhibits proper breathing techniques, such as pursed lip breathing or other method taught during program session;Demonstrates proper use of MDI's;Compliance with respiratory medication  Verbalizes importance of monitoring SPO2 with pulse oximeter and return demonstration;Maintenance of O2 saturations>88%;Exhibits proper breathing techniques, such as pursed lip breathing or other method taught during program session;Demonstrates proper use of MDI's;Compliance with respiratory medication  Verbalizes importance of monitoring SPO2 with pulse oximeter and  return demonstration;Maintenance of O2 saturations>88%;Exhibits proper breathing techniques, such as pursed lip breathing or other method taught during program session;Demonstrates proper use of MDI's;Compliance with respiratory medication    Comments  Reviewed PLB technique with pt.  Talked about how it work and it's important to maintaining his exercise saturations.    Goro is going well with his PLB and checking his oxygen at home. One thing he can do more is check his oxygen while he is doing something strenuous. He has been in the low 90s range with his oxygen while exercising.  Britton continues to use his PLB at home and in class during exercise. He stated he feels like he is breathing better and his O2 Sats at home are maintained above 92%.  Patient has been using his nebulizers at home as needed and has no questions about his medications. He checks his oxygen at home routinely. His oxygen does not drop below 90 percent on any exertion that he does.     Goals/Expected Outcomes  Short: Become more profiecient at using PLB.   Long: Become independent at using PLB.  Short: monitor oxygen at home with exertion. Long: maintain oxygen saturations above 88 percent independently.  Short: continue to monitor oxygen at home with exercise or exertion. Long: independently monitor oxygen sats and maintain above 88 percent.   Short: attend [facility] routinely to improve SOB. Long: maintain and exercise routine to hel with shortness of breath       Oxygen Discharge (Final Oxygen Re-Evaluation): Oxygen Re-Evaluation - 10/19/17 1658      Program Oxygen Prescription   Program Oxygen Prescription  None      Home Oxygen   Home Oxygen Device  None    Sleep Oxygen Prescription  None    Home Exercise Oxygen Prescription  None    Home at Rest Exercise Oxygen Prescription  None      Goals/Expected Outcomes   Short Term Goals  To learn and demonstrate proper use of respiratory medications;To learn and demonstrate  proper pursed lip breathing techniques or other breathing techniques.;To learn and understand importance of maintaining oxygen saturations>88%;To learn and understand importance of monitoring SPO2 with pulse oximeter and demonstrate accurate use of the pulse oximeter.    Long  Term Goals  Verbalizes importance of monitoring SPO2 with pulse oximeter and return demonstration;Maintenance of O2 saturations>88%;Exhibits proper breathing techniques, such as pursed lip breathing or other method taught during program session;Demonstrates proper use of MDI's;Compliance with respiratory medication    Comments  Patient has been using his nebulizers at home as needed and has no questions about his medications. He checks his oxygen at home routinely. His oxygen does  not drop below 90 percent on any exertion that he does.     Goals/Expected Outcomes  Short: attend [facility] routinely to improve SOB. Long: maintain and exercise routine to hel with shortness of breath       Initial Exercise Prescription: Initial Exercise Prescription - 08/03/17 1500      Date of Initial Exercise RX and Referring Provider   Date  08/03/17    Referring Provider  Simonne Maffucci MD      Treadmill   MPH  2.8    Grade  0.5    Minutes  15    METs  3.34      REL-XR   Level  2    Speed  50    Minutes  15    METs  3      T5 Nustep   Level  2    SPM  80    Minutes  15    METs  3      Prescription Details   Frequency (times per week)  2    Duration  Progress to 45 minutes of aerobic exercise without signs/symptoms of physical distress      Intensity   THRR 40-80% of Max Heartrate  96-129    Ratings of Perceived Exertion  11-13    Perceived Dyspnea  0-4      Progression   Progression  Continue to progress workloads to maintain intensity without signs/symptoms of physical distress.      Resistance Training   Training Prescription  Yes    Weight  4 lbs    Reps  10-15       Perform Capillary Blood Glucose  checks as needed.  Exercise Prescription Changes: Exercise Prescription Changes    Row Name 08/03/17 1500 08/12/17 1400 08/26/17 1500 09/09/17 1500 09/11/17 1200     Response to Exercise   Blood Pressure (Admit)  134/60  124/60  134/74  110/72  110/72   Blood Pressure (Exercise)  150/64  142/64  -  -  -   Blood Pressure (Exit)  142/60  118/64  126/64  110/60  110/60   Heart Rate (Admit)  63 bpm  63 bpm  73 bpm  78 bpm  78 bpm   Heart Rate (Exercise)  84 bpm  88 bpm  95 bpm  89 bpm  89 bpm   Heart Rate (Exit)  75 bpm  75 bpm  65 bpm  69 bpm  69 bpm   Oxygen Saturation (Admit)  99 %  95 %  95 %  94 %  94 %   Oxygen Saturation (Exercise)  85 %  90 %  95 %  93 %  93 %   Oxygen Saturation (Exit)  97 %  97 %  90 %  95 %  95 %   Rating of Perceived Exertion (Exercise)  _0 Perceived Dyspnea (Exercise)  _1 Symptoms  SOB, cramp in left calf  SOB  -  -  -   Comments  walk test results  first full day of exercise  -  -  -   Duration  -  Progress to 45 minutes of aerobic exercise without signs/symptoms of physical distress  Progress to 45 minutes of aerobic exercise without signs/symptoms of physical distress  Progress to 45 minutes of aerobic exercise without signs/symptoms of physical distress  Progress to  45 minutes of aerobic exercise without signs/symptoms of physical distress   Intensity  -  THRR unchanged  THRR unchanged  THRR unchanged  THRR unchanged     Progression   Progression  -  Continue to progress workloads to maintain intensity without signs/symptoms of physical distress.  Continue to progress workloads to maintain intensity without signs/symptoms of physical distress.  Continue to progress workloads to maintain intensity without signs/symptoms of physical distress.  Continue to progress workloads to maintain intensity without signs/symptoms of physical distress.   Average METs  -  3.58  2.4  3.77  3.77     Resistance Training   Training Prescription  -   Yes  Yes  Yes  Yes   Weight  -  4 lbs  4 lb  4 lb  4 lb   Reps  -  10-15  10-15  10-15  10-15     Interval Training   Interval Training  -  No  -  No  No     Treadmill   MPH  -  2.8  2.8  2.8  2.8   Grade  -  0.5  0.5  0.5  0.5   Minutes  -  _0 METs  -  3.34  3.34  3.34  3.34     REL-XR   Level  -  2  -  4  4   Speed  -  77  -  -  -   Minutes  -  15  -  15  15   METs  -  5.5  -  4.2  4.2     T5 Nustep   Level  -  2  2  -  -   SPM  -  69  80  -  -   Minutes  -  15  15  -  -   METs  -  1.9  1.9  -  -     Home Exercise Plan   Plans to continue exercise at  -  -  -  [employer] (comment) [employer]   Frequency  -  -  -  -  Add 1 additional day to program exercise sessions.   Initial Home Exercises Provided  -  -  -  -  09/11/17   Row Name 09/24/17 0900 10/22/17 0900           Response to Exercise   Blood Pressure (Admit)  114/64  130/82      Blood Pressure (Exit)  110/62  130/70      Heart Rate (Admit)  75 bpm  82 bpm      Heart Rate (Exercise)  95 bpm  90 bpm      Heart Rate (Exit)  74 bpm  71 bpm      Oxygen Saturation (Admit)  94 %  93 %      Oxygen Saturation (Exercise)  92 %  94 %      Oxygen Saturation (Exit)  92 %  96 %      Rating of Perceived Exertion (Exercise)  13  13      Perceived Dyspnea (Exercise)  3  2      Duration  Continue with 45 min of aerobic exercise without signs/symptoms of physical distress.  Continue with 45 min of aerobic exercise without signs/symptoms of physical distress.      Intensity  THRR unchanged  THRR unchanged        Progression   Progression  Continue to progress workloads to maintain intensity without signs/symptoms of physical distress.  Continue to progress workloads to maintain intensity without signs/symptoms of physical distress.      Average METs  3.6  3.6        Resistance Training   Training Prescription  Yes  Yes      Weight  4 lb  4 lb      Reps  10-15  10-15        Interval Training    Interval Training  No  No        Treadmill   MPH  2.8  2.8      Grade  0.5  0.5      Minutes  15  15      METs  3.34  3.34        REL-XR   Level  4  4      Minutes  15  15      METs  3.8  3.8        T5 Nustep   Level  3  3      SPM  80  80      Minutes  15  15      METs  2.1  2        Home Exercise Plan   Plans to continue exercise at  [employer] (comment) [employer], music (comment) [employer]      Frequency  Add 1 additional day to program exercise sessions.  Add 1 additional day to program exercise sessions.      Initial Home Exercises Provided  09/11/17  09/11/17         Exercise Comments: Exercise Comments    Row Name 08/10/17 1055 09/11/17 1213         Exercise Comments  First full day of exercise!  Patient was oriented to gym and equipment including functions, settings, policies, and procedures.  Patient's individual exercise prescription and treatment plan were reviewed.  All starting workloads were established based on the results of the 6 minute walk test done at initial orientation visit.  The plan for exercise progression was also introduced and progression will be customized based on patient's performance and goals.  Reviewed home exercise with pt today.  Pt plans to walk and join [employer] for exercise.  Reviewed THR, pulse, RPE, sign and symptoms, NTG use, and when to call 911 or MD.  Also discussed weather considerations and indoor options.  Pt voiced understanding.         Exercise Goals and Review: Exercise Goals    Row Name 08/03/17 1533             Exercise Goals   Increase Physical Activity  Yes       Intervention  Provide advice, education, support and counseling about physical activity/exercise needs.;Develop an individualized exercise prescription for aerobic and resistive training based on initial evaluation findings, risk stratification, comorbidities and participant's personal goals.       Expected Outcomes   Short Term: Attend rehab on a regular basis to increase amount of physical activity.;Long Term: Add in home exercise to make exercise part of routine and to increase amount of physical activity.;Long Term: Exercising regularly at least 3-5 days a week.       Increase Strength and Stamina  Yes  Intervention  Provide advice, education, support and counseling about physical activity/exercise needs.;Develop an individualized exercise prescription for aerobic and resistive training based on initial evaluation findings, risk stratification, comorbidities and participant's personal goals.       Expected Outcomes  Short Term: Increase workloads from initial exercise prescription for resistance, speed, and METs.;Short Term: Perform resistance training exercises routinely during rehab and add in resistance training at home;Long Term: Improve cardiorespiratory fitness, muscular endurance and strength as measured by increased METs and functional capacity (6MWT)       Able to understand and use rate of perceived exertion (RPE) scale  Yes       Intervention  Provide education and explanation on how to use RPE scale       Expected Outcomes  Short Term: Able to use RPE daily in rehab to express subjective intensity level;Long Term:  Able to use RPE to guide intensity level when exercising independently       Able to understand and use Dyspnea scale  Yes       Intervention  Provide education and explanation on how to use Dyspnea scale       Expected Outcomes  Short Term: Able to use Dyspnea scale daily in rehab to express subjective sense of shortness of breath during exertion;Long Term: Able to use Dyspnea scale to guide intensity level when exercising independently       Knowledge and understanding of Target Heart Rate Range (THRR)  Yes       Intervention  Provide education and explanation of THRR including how the numbers were predicted and where they are located for reference       Expected Outcomes  Short Term:  Able to state/look up THRR;Short Term: Able to use daily as guideline for intensity in rehab;Long Term: Able to use THRR to govern intensity when exercising independently       Able to check pulse independently  Yes       Intervention  Provide education and demonstration on how to check pulse in carotid and radial arteries.;Review the importance of being able to check your own pulse for safety during independent exercise       Expected Outcomes  Short Term: Able to explain why pulse checking is important during independent exercise;Long Term: Able to check pulse independently and accurately       Understanding of Exercise Prescription  Yes       Intervention  Provide education, explanation, and written materials on patient's individual exercise prescription       Expected Outcomes  Short Term: Able to explain program exercise prescription;Long Term: Able to explain home exercise prescription to exercise independently          Exercise Goals Re-Evaluation : Exercise Goals Re-Evaluation    Row Name 08/10/17 1055 08/26/17 1502 09/09/17 1529 09/11/17 1213 09/21/17 1102     Exercise Goal Re-Evaluation   Exercise Goals Review  Understanding of Exercise Prescription;Able to understand and use Dyspnea scale;Knowledge and understanding of Target Heart Rate Range (THRR);Able to understand and use rate of perceived exertion (RPE) scale  Increase Physical Activity;Able to understand and use Dyspnea scale;Increase Strength and Stamina;Knowledge and understanding of Target Heart Rate Range (THRR);Able to understand and use rate of perceived exertion (RPE) scale  Increase Physical Activity;Able to understand and use rate of perceived exertion (RPE) scale;Increase Strength and Stamina;Able to understand and use Dyspnea scale  Increase Physical Activity;Increase Strength and Stamina;Able to understand and use rate of perceived exertion (RPE) scale;Able to  understand and use Dyspnea scale;Knowledge and understanding of  Target Heart Rate Range (THRR);Able to check pulse independently;Understanding of Exercise Prescription  Increase Physical Activity;Increase Strength and Stamina;Able to understand and use rate of perceived exertion (RPE) scale;Able to understand and use Dyspnea scale;Knowledge and understanding of Target Heart Rate Range (THRR);Able to check pulse independently;Understanding of Exercise Prescription   Comments  Reviewed RPE scale, THR and program prescription with pt today.  Pt voiced understanding and was given a copy of goals to take home.   Pt is tolerating exercise well.  Staff will monitor progress.  Taj has made steady increases in MET level.  Staff will monitor progress.  Reviewed home exercise with pt today.  Pt plans to walk and join [employer] for exercise.  Reviewed THR, pulse, RPE, sign and symptoms, NTG use, and when to call 911 or MD.  Also discussed weather considerations and indoor options.  Pt voiced understanding.  Asberry is exercising at home. He reports walking and doing strength training at home 2 days a week on the days he does not come to rehab. He is monitoring HR and oxygen at home during exercise and reports that his oxygen saturations remain above 92%. He reports feeling stronger and being able to do more yard work which he enjoys.    Expected Outcomes  Short: Use RPE daily to regulate intensity.  Long: Follow program prescription in THR.  Short - attend LW and exercise 3 days per week Long - increase overall MET level  Short - continue to attend regularly Long - increase MET level  [place] will monitor HR and O2 when walking, get info about joining gym [place] will maintain exercise on his own  Short: Continue to walk and strength train at home 2 days a week while monitoring oxygen saturations. Long: Maintain independent exercise routine upon graduation from rehab.     [place] Name 09/24/17 1005 10/19/17 1655 10/22/17 0931         Exercise Goal Re-Evaluation   Exercise  Goals Review  Increase Physical Activity;Increase Strength and Stamina;Able to understand and use rate of perceived exertion (RPE) scale;Able to understand and use Dyspnea scale  Increase Physical Activity;Increase Strength and Stamina  Increase Physical Activity;Increase Strength and Stamina;Able to understand and use rate of perceived exertion (RPE) scale;Able to understand and use Dyspnea scale     Comments  Jaheem is tolerating exercise well.  Staff will monitor progress.  Dermot has missed alot of class due to vacation and his brother being in the hospital. He states he is walking at home around his neighborhood for exercise.  Hiro has missed some sessions due to vacation and family health issues.  He has maintained his previous exercise level.  Staff will monitor progress.     Expected Outcomes  Short - attend class consistently Long - increase overall MET level  Short: attened [facility] Monday and Friday to increase strength. Long: maintain exercise independently post [facility].  Short - attend LW consistently Long - increase MET level        Discharge Exercise Prescription (Final Exercise Prescription Changes): Exercise Prescription Changes - 10/22/17 0900      Response to Exercise   Blood Pressure (Admit)  130/82    Blood Pressure (Exit)  130/70    Heart Rate (Admit)  82 bpm    Heart Rate (Exercise)  90 bpm    Heart Rate (Exit)  71 bpm    Oxygen Saturation (Admit)  93 %  Oxygen Saturation (Exercise)  94 %    Oxygen Saturation (Exit)  96 %    Rating of Perceived Exertion (Exercise)  13    Perceived Dyspnea (Exercise)  2    Duration  Continue with 45 min of aerobic exercise without signs/symptoms of physical distress.    Intensity  THRR unchanged      Progression   Progression  Continue to progress workloads to maintain intensity without signs/symptoms of physical distress.    Average METs  3.6      Resistance Training   Training Prescription  Yes    Weight  4 lb    Reps  10-15       Interval Training   Interval Training  No      Treadmill   MPH  2.8    Grade  0.5    Minutes  15    METs  3.34      REL-XR   Level  4    Minutes  15    METs  3.8      T5 Nustep   Level  3    SPM  80    Minutes  15    METs  2      Home Exercise Plan   Plans to continue exercise at  [employer] (comment)   [place]   Frequency  Add 1 additional day to program exercise sessions.    Initial Home Exercises Provided  09/11/17       Nutrition:  Target Goals: Understanding of nutrition guidelines, daily intake of sodium <1571m, cholesterol <2069m calories 30% from fat and 7% or less from saturated fats, daily to have 5 or more servings of fruits and vegetables.  Biometrics: Pre Biometrics - 08/03/17 1533      Pre Biometrics   Height  5' 10.9" (1.801 m)    Weight  144 lb 14.4 oz (65.7 kg)    Waist Circumference  36 inches    Hip Circumference  37 inches    Waist to Hip Ratio  0.97 %    BMI (Calculated)  20.26    Single Leg Stand  30 seconds        Nutrition Therapy Plan and Nutrition Goals: Nutrition Therapy & Goals - 08/31/17 1125      Nutrition Therapy   Diet  TLC    Protein (specify units)  13oz    Fiber  35 grams    Whole Grain Foods  3 servings    Saturated Fats  16 max. grams    Fruits and Vegetables  5 servings/day   8 ideal   Sodium  2000 grams      Personal Nutrition Goals   Nutrition Goal  Make protein a priority at each meal / snack. Aim for 15g/3oz of protein at meal times    Personal Goal #2  Add fats to meals and use liquid calorie sources such as Boost (continue to drink this daily) to help meet your daily nutritional requirements and prevent weight loss    Personal Goal #3  Consider eating smaller, more frequent "mini meals" to help prevent evening bloating. You may also want to limit the specific foods listed on the handout provided    Comments  He is satisfied with his CBW but does not want to lose more weight. Per his  description, portion sizes at meal times are small      Intervention Plan   Intervention  Prescribe, educate and counsel regarding individualized specific  dietary modifications aiming towards targeted core components such as weight, hypertension, lipid management, diabetes, heart failure and other comorbidities.;Nutrition handout(s) given to patient.   Nutrition guidelines for COPD, High calorie/ high protein nutrition therapy    Expected Outcomes  Short Term Goal: Understand basic principles of dietary content, such as calories, fat, sodium, cholesterol and nutrients.;Short Term Goal: A plan has been developed with personal nutrition goals set during dietitian appointment.;Long Term Goal: Adherence to prescribed nutrition plan.       Nutrition Assessments: Nutrition Assessments - 08/03/17 1432      MEDFICTS Scores   Pre Score  82       Nutrition Goals Re-Evaluation: Nutrition Goals Re-Evaluation    [place] Name 08/28/17 1038 08/31/17 1132 09/21/17 1119         Goals   Current Weight  146 lb (66.2 kg)  -  149 lb (67.6 kg)     Nutrition Goal  Learn to eat healthier. Maintain weight and gain strength  Add fats to meals and use liquid calorie sources such as Boost (continue to drink this daily) to help meet your daily nutritional requirements and prevent weight loss  Add fats to meals and use liquid calorie sources such as Boost (continue to drink this daily) to help meet your daily nutritional requirements and prevent weight loss     Comment  Tramel states that he eats good. He wants to meat with the dietician to see if he is getting enough nutrients.  He does not wish to lose weight. Currently drinks 1 Boost/day which his wife makes into a milkshake or smoothie  Kaylor reports eating a protein at least 4 times a day (yoghurt, protein bars, boost drink, etc) and is trying to eat more frequent smaller meals. He wants to gain weight and feel stronger and stated that he has recently been able to gain 5  lbs and is feeling better.      Expected Outcome  Short: Meet with the dietician. Long: Adhere to a diet plan recommended by the RD.  He will use the suggestions provided to add calories to meals via fats and protein. He will continue to drink Boost at least once per day  Short: goal weight 155 lbs. Long: maintian healthy eating habbits to help manage weight and energy levels.        Personal Goal #2 Re-Evaluation   Personal Goal #2  -  Make protein a priority at each meal and snack. Aim for at least 15g/3oz of protein at meal times  Make protein a priority at each meal and snack. Aim for at least 15g/3oz of protein at meal times       Personal Goal #3 Re-Evaluation   Personal Goal #3  -  Consider eating smaller, more frequent "mini meals" to help prevent evening bloating. You may also want to limit the specific foods listed on the handout provided  Consider eating smaller, more frequent "mini meals" to help prevent evening bloating. You may also want to limit the specific foods listed on the handout provided        Nutrition Goals Discharge (Final Nutrition Goals Re-Evaluation): Nutrition Goals Re-Evaluation - 09/21/17 1119      Goals   Current Weight  149 lb (67.6 kg)    Nutrition Goal  Add fats to meals and use liquid calorie sources such as Boost (continue to drink this daily) to help meet your daily nutritional requirements and prevent weight loss    Comment  Elita Quick  reports eating a protein at least 4 times a day (yoghurt, protein bars, boost drink, etc) and is trying to eat more frequent smaller meals. He wants to gain weight and feel stronger and stated that he has recently been able to gain 5 lbs and is feeling better.     Expected Outcome  Short: goal weight 155 lbs. Long: maintian healthy eating habbits to help manage weight and energy levels.       Personal Goal #2 Re-Evaluation   Personal Goal #2  Make protein a priority at each meal and snack. Aim for at least 15g/3oz of protein at  meal times      Personal Goal #3 Re-Evaluation   Personal Goal #3  Consider eating smaller, more frequent "mini meals" to help prevent evening bloating. You may also want to limit the specific foods listed on the handout provided       Psychosocial: Target Goals: Acknowledge presence or absence of significant depression and/or stress, maximize coping skills, provide positive support system. Participant is able to verbalize types and ability to use techniques and skills needed for reducing stress and depression.   Initial Review & Psychosocial Screening: Initial Psych Review & Screening - 08/03/17 1429      Initial Review   Current issues with  Current Stress Concerns    Source of Stress Concerns  Chronic Illness    Comments  COPD is his main concern with his stress.      Family Dynamics   Good Support System?  Yes    Comments  He can look to his wife, dog and two childeren for support.      Barriers   Psychosocial barriers to participate in program  The patient should benefit from training in stress management and relaxation.      Screening Interventions   Interventions  To provide support and resources with identified psychosocial needs;Encouraged to exercise;Program counselor consult;Provide feedback about the scores to participant    Expected Outcomes  Short Term goal: Utilizing psychosocial counselor, staff and physician to assist with identification of specific Stressors or current issues interfering with healing process. Setting desired goal for each stressor or current issue identified.;Long Term Goal: Stressors or current issues are controlled or eliminated.;Short Term goal: Identification and review with participant of any Quality of Life or Depression concerns found by scoring the questionnaire.;Long Term goal: The participant improves quality of Life and PHQ9 Scores as seen by post scores and/or verbalization of changes       Quality of Life Scores:  Scores of 19 and below  usually indicate a poorer quality of life in these areas.  A difference of  2-3 points is a clinically meaningful difference.  A difference of 2-3 points in the total score of the Quality of Life Index has been associated with significant improvement in overall quality of life, self-image, physical symptoms, and general health in studies assessing change in quality of life.  PHQ-9: Recent Review Flowsheet Data    Depression screen [facility] 2/9 08/03/2017 04/16/2017 04/16/2017 06/17/2016 04/30/2015   Decreased Interest 1 0 0 0 0   Down, Depressed, Hopeless - 0 0 0 0   PHQ - 2 Score 1 0 0 0 0   Altered sleeping 0 0 - - -   Tired, decreased energy 2 0 - - -   Change in appetite 1 0 - - -   Feeling bad or failure about yourself  1 0 - - -   Trouble concentrating 0  0 - - -   Moving slowly or fidgety/restless 0 0 - - -   Suicidal thoughts 0 0 - - -   PHQ-9 Score 5 0 - - -   Difficult doing work/chores Somewhat difficult Not difficult at all - - -     Interpretation of Total Score  Total Score Depression Severity:  1-4 = Minimal depression, 5-9 = Mild depression, 10-14 = Moderate depression, 15-19 = Moderately severe depression, 20-27 = Severe depression   Psychosocial Evaluation and Intervention: Psychosocial Evaluation - 08/24/17 1049      Psychosocial Evaluation & Interventions   Interventions  Encouraged to exercise with the program and follow exercise prescription    Comments  Counselor met with Mr. [facility]) today for initial psychosocial evaluation.  He is a 72 year old who has a diagnosis of COPD.  He has a strong support system with a spouse of 39 years; several adult children who live locally and active involvement in a local church.  Tajh sleeps "okay" with about 5 hours most nights.  He has a good appetite and denies a history of depression or anxiety of any current symptoms.  Abass states he is typically in a positive mood most of the time and was quite jovial today in this assessment.  He  reports minimal stress in his life - other than his health at this time.  Deagen has goals to get back on the golf course of 18 holes and breathe better - with more energy while in this program.  Staff will follow with him.      Expected Outcomes  Short:  Savian will exercise consistently to increase his energy and breathe better.  He will attend the educational components of this program to understand and manage his condition more positively.  Long:  Daquon will consistently exercise and practice the skills he learns in order to get back on the golf course for 18 one day!    Continue Psychosocial Services   Follow up required by staff       Psychosocial Re-Evaluation: Psychosocial Re-Evaluation    [place] Name 09/21/17 1115 10/19/17 1700           Psychosocial Re-Evaluation   Current issues with  Current Stress Concerns health concerns  Current Stress Concerns      Comments  Keval reports no new stressors or changes in his psychosocial well-being. He did report feeling like he can do more, specifically yard work, which he enjoys. He is wanting to get back to golf. He has not yet but is working on it.   Lavarius states that his brother is in the hospital and is a very stressful time for him. He is able to exercise to get his mind off his brother for a little while. Other than his brother Edinson does not seem to have any other stressors.      Expected Outcomes  Short: Get back to golf game and other activities that he enjoys to do. Long: Maintain healthy balance with life stresses.   Short: Attend [facility] stress management education to decrease stress. Long: Maintain exercise Post [facility] to keep stress at a minimum      Interventions  -  Encouraged to attend Pulmonary Rehabilitation for the exercise      Continue Psychosocial Services   Follow up required by staff  Follow up required by staff         Psychosocial Discharge (Final Psychosocial Re-Evaluation): Psychosocial Re-Evaluation - 10/19/17 1700  Psychosocial Re-Evaluation   Current issues with  Current Stress Concerns    Comments  Danh states that his brother is in the hospital and is a very stressful time for him. He is able to exercise to get his mind off his brother for a little while. Other than his brother Aashish does not seem to have any other stressors.    Expected Outcomes  Short: Attend [facility] stress management education to decrease stress. Long: Maintain exercise Post [facility] to keep stress at a minimum    Interventions  Encouraged to attend Pulmonary Rehabilitation for the exercise    Continue Psychosocial Services   Follow up required by staff       Education: Education Goals: Education classes will be provided on a weekly basis, covering required topics. Participant will state understanding/return demonstration of topics presented.  Learning Barriers/Preferences: Learning Barriers/Preferences - 08/03/17 1438      Learning Barriers/Preferences   Learning Barriers  Sight   prescription do   Learning Preferences  None       Education Topics:  Initial Evaluation Education: - Verbal, written and demonstration of respiratory meds, oximetry and breathing techniques. Instruction on use of nebulizers and MDIs and importance of monitoring MDI activations.   Pulmonary Rehab from 09/25/2017 in [facility] Cardiac and Pulmonary Rehab  Date  08/03/17  Educator  [facility]  Instruction Review Code  1- Verbalizes Understanding      General Nutrition Guidelines/Fats and Fiber: -Group instruction provided by verbal, written material, models and posters to present the general guidelines for heart healthy nutrition. Gives an explanation and review of dietary fats and fiber.   Pulmonary Rehab from 09/25/2017 in [facility] Cardiac and Pulmonary Rehab  Date  08/24/17  Educator  CR  Instruction Review Code  1- Verbalizes Understanding      Controlling Sodium/Reading Food Labels: -Group verbal and written material supporting the discussion of  sodium use in heart healthy nutrition. Review and explanation with models, verbal and written materials for utilization of the food label.   Pulmonary Rehab from 09/25/2017 in [facility] Cardiac and Pulmonary Rehab  Date  08/31/17  Educator  CR  Instruction Review Code  1- Verbalizes Understanding      Exercise Physiology & General Exercise Guidelines: - Group verbal and written instruction with models to review the exercise physiology of the cardiovascular system and associated critical values. Provides general exercise guidelines with specific guidelines to those with heart or lung disease.    Aerobic Exercise & Resistance Training: - Gives group verbal and written instruction on the various components of exercise. Focuses on aerobic and resistive training programs and the benefits of this training and how to safely progress through these programs.   Flexibility, Balance, Mind/Body Relaxation: Provides group verbal/written instruction on the benefits of flexibility and balance training, including mind/body exercise modes such as yoga, pilates and tai chi.  Demonstration and skill practice provided.   Pulmonary Rehab from 09/25/2017 in [facility] Cardiac and Pulmonary Rehab  Date  09/11/17  Educator  AS  Instruction Review Code  1- Verbalizes Understanding      Stress and Anxiety: - Provides group verbal and written instruction about the health risks of elevated stress and causes of high stress.  Discuss the correlation between heart/lung disease and anxiety and treatment options. Review healthy ways to manage with stress and anxiety.   Depression: - Provides group verbal and written instruction on the correlation between heart/lung disease and depressed mood, treatment options, and the stigmas associated with seeking  treatment.   Exercise & Equipment Safety: - Individual verbal instruction and demonstration of equipment use and safety with use of the equipment.   Pulmonary Rehab from 09/25/2017  in [facility] Cardiac and Pulmonary Rehab  Date  08/03/17  Educator  [facility]  Instruction Review Code  1- Verbalizes Understanding      Infection Prevention: - Provides verbal and written material to individual with discussion of infection control including proper hand washing and proper equipment cleaning during exercise session.   Pulmonary Rehab from 09/25/2017 in [facility] Cardiac and Pulmonary Rehab  Date  08/03/17  Educator  [facility]  Instruction Review Code  1- Verbalizes Understanding      Falls Prevention: - Provides verbal and written material to individual with discussion of falls prevention and safety.   Pulmonary Rehab from 09/25/2017 in [facility] Cardiac and Pulmonary Rehab  Date  08/03/17  Educator  [facility]  Instruction Review Code  1- Verbalizes Understanding      Diabetes: - Individual verbal and written instruction to review signs/symptoms of diabetes, desired ranges of glucose level fasting, after meals and with exercise. Advice that pre and post exercise glucose checks will be done for 3 sessions at entry of program.   Chronic Lung Diseases: - Group verbal and written instruction to review updates, respiratory medications, advancements in procedures and treatments. Discuss use of supplemental oxygen including available portable oxygen systems, continuous and intermittent flow rates, concentrators, personal use and safety guidelines. Review proper use of inhaler and spacers. Provide informative websites for self-education.    Energy Conservation: - Provide group verbal and written instruction for methods to conserve energy, plan and organize activities. Instruct on pacing techniques, use of adaptive equipment and posture/positioning to relieve shortness of breath.   Triggers and Exacerbations: - Group verbal and written instruction to review types of environmental triggers and ways to prevent exacerbations. Discuss weather changes, air quality and the benefits of nasal washing. Review warning  signs and symptoms to help prevent infections. Discuss techniques for effective airway clearance, coughing, and vibrations.   AED/CPR: - Group verbal and written instruction with the use of models to demonstrate the basic use of the AED with the basic ABC's of resuscitation.   Pulmonary Rehab from 09/25/2017 in [facility] Cardiac and Pulmonary Rehab  Date  09/25/17  Educator  [facility]  Instruction Review Code  1- Actuary and Physiology of the Lungs: - Group verbal and written instruction with the use of models to provide basic lung anatomy and physiology related to function, structure and complications of lung disease.   Anatomy & Physiology of the Heart: - Group verbal and written instruction and models provide basic cardiac anatomy and physiology, with the coronary electrical and arterial systems. Review of Valvular disease and Heart Failure   Cardiac Medications: - Group verbal and written instruction to review commonly prescribed medications for heart disease. Reviews the medication, class of the drug, and side effects.   Know Your Numbers and Risk Factors: -Group verbal and written instruction about important numbers in your health.  Discussion of what are risk factors and how they play a role in the disease process.  Review of Cholesterol, Blood Pressure, Diabetes, and BMI and the role they play in your overall health.   Sleep Hygiene: -Provides group verbal and written instruction about how sleep can affect your health.  Define sleep hygiene, discuss sleep cycles and impact of sleep habits. Review good sleep hygiene tips.    Other: -Provides  group and verbal instruction on various topics (see comments)    Knowledge Questionnaire Score: Knowledge Questionnaire Score - 08/03/17 1436      Knowledge Questionnaire Score   Pre Score  13/18   reviewed with patient       Core Components/Risk Factors/Patient Goals at Admission: Personal Goals and Risk  Factors at Admission - 08/03/17 1443      Core Components/Risk Factors/Patient Goals on Admission    Weight Management  Yes;Weight Maintenance    Intervention  Weight Management: Develop a combined nutrition and exercise program designed to reach desired caloric intake, while maintaining appropriate intake of nutrient and fiber, sodium and fats, and appropriate energy expenditure required for the weight goal.;Weight Management: Provide education and appropriate resources to help participant work on and attain dietary goals.;Weight Management/Obesity: Establish reasonable short term and long term weight goals.    Admit Weight  144 lb 14.4 oz (65.7 kg)    Goal Weight: Short Term  145 lb (65.8 kg)    Goal Weight: Long Term  145 lb (65.8 kg)    Expected Outcomes  Short Term: Continue to assess and modify interventions until short term weight is achieved;Weight Maintenance: Understanding of the daily nutrition guidelines, which includes 25-35% calories from fat, 7% or less cal from saturated fats, less than 212m cholesterol, less than 1.5gm of sodium, & 5 or more servings of fruits and vegetables daily;Long Term: Adherence to nutrition and physical activity/exercise program aimed toward attainment of established weight goal;Understanding recommendations for meals to include 15-35% energy as protein, 25-35% energy from fat, 35-60% energy from carbohydrates, less than 2018mof dietary cholesterol, 20-35 gm of total fiber daily;Understanding of distribution of calorie intake throughout the day with the consumption of 4-5 meals/snacks    Improve shortness of breath with ADL's  Yes    Intervention  Provide education, individualized exercise plan and daily activity instruction to help decrease symptoms of SOB with activities of daily living.    Expected Outcomes  Short Term: Improve cardiorespiratory fitness to achieve a reduction of symptoms when performing ADLs;Long Term: Be able to perform more ADLs without  symptoms or delay the onset of symptoms       Core Components/Risk Factors/Patient Goals Review:  Goals and Risk Factor Review    Row Name 08/28/17 1038 09/21/17 1111           Core Components/Risk Factors/Patient Goals Review   Personal Goals Review  Weight Management/Obesity;Improve shortness of breath with ADL's  Weight Management/Obesity;Improve shortness of breath with ADL's      Review  JuEwartas maintained his weight since he started the program. His ADLs have been improving at home. He cannot work in the yar if it is to hot or humid.  JuZacheports taking all meds. JuFeltonants to gain weight and feel stronger. He has recently gained 5 lbs and stated he is able to do more around the house as he feels stronger. He has been able to do some more yard work but is still careful about being out in the heat of the day. He stated that he feels like he is breathing better and it is not limiting him as much as it used to.       Expected Outcomes  Short: Attend [facility] regularly to improve shortness of breath with ADL's. Long: maintain independence with ADL's   Short: Attend [facility] regularly to improve shortness of breath with ADL's. Continue to take all medications and exercise to gain strength. Weight  goal 155 lbs.  Long: maintain independence with ADL's          Core Components/Risk Factors/Patient Goals at Discharge (Final Review):  Goals and Risk Factor Review - 09/21/17 1111      Core Components/Risk Factors/Patient Goals Review   Personal Goals Review  Weight Management/Obesity;Improve shortness of breath with ADL's    Review  Tadhg reports taking all meds. Zvi wants to gain weight and feel stronger. He has recently gained 5 lbs and stated he is able to do more around the house as he feels stronger. He has been able to do some more yard work but is still careful about being out in the heat of the day. He stated that he feels like he is breathing better and it is not limiting him as much  as it used to.     Expected Outcomes  Short: Attend [facility] regularly to improve shortness of breath with ADL's. Continue to take all medications and exercise to gain strength. Weight goal 155 lbs.  Long: maintain independence with ADL's        ITP Comments: ITP Comments    Row Name 08/03/17 1358 08/10/17 0947 09/07/17 0849 10/05/17 0823 10/16/17 0847   ITP Comments  Medical Evaluation completed. Chart sent for review and changes to Dr. Emily Filbert Director of [facility]. Diagnosis can be found in [facility] encounter 06/22/17  30 day review completed. ITP sent to Dr. Emily Filbert Director of [facility]. Continue with ITP unless changes are made by physician  30 day review completed. ITP sent to Dr. Emily Filbert Director of [facility] [place]. Continue with ITP unless changes are made by physician  30 day review completed. ITP sent to Dr. Emily Filbert Director of [facility]. Continue with ITP unless changes are made by physician  Patient states he has been tied up and will return to [facility] on Monday 10/19/17.   Burn[place] Name 11/02/17 0828           ITP Comments  30 day review completed. ITP sent to Dr. Emily Filbert Director of [facility]. Continue with ITP unless changes are made by physician          Comments: 30 day review

## 2017-11-02 NOTE — Progress Notes (Signed)
Date:  11/02/2017   Name:  Antonio [name]   DOB:  [birth year]   MRN:  [id number]   Chief Complaint: Conjunctivitis (L) eye running, red and matted in the am) Conjunctivitis   The current episode started 5 to 7 days ago. The onset was gradual. The problem occurs continuously. The problem has been gradually worsening. The problem is moderate. Nothing relieves the symptoms. Associated symptoms include decreased vision, eye itching and eye discharge. Pertinent negatives include no fever, no double vision, no photophobia, no abdominal pain, no constipation, no diarrhea, no nausea, no congestion, no ear discharge, no ear pain, no headaches, no hearing loss, no mouth sores, no rhinorrhea, no sore throat, no stridor, no swollen glands, no neck pain, no cough, no wheezing, no rash, no eye pain and no eye redness. The eye pain is mild. He has been eating and drinking normally.     Review of Systems  Constitutional: Negative for chills and fever.  HENT: Negative for congestion, drooling, ear discharge, ear pain, hearing loss, mouth sores, rhinorrhea and sore throat.   Eyes: Positive for discharge and itching. Negative for double vision, photophobia, pain and redness.  Respiratory: Negative for cough, shortness of breath, wheezing and stridor.   Cardiovascular: Negative for chest pain, palpitations and leg swelling.  Gastrointestinal: Negative for abdominal pain, blood in stool, constipation, diarrhea and nausea.  Endocrine: Negative for polydipsia.  Genitourinary: Negative for dysuria, frequency, hematuria and urgency.  Musculoskeletal: Negative for back pain, myalgias and neck pain.  Skin: Negative for rash.  Allergic/Immunologic: Negative for environmental allergies.  Neurological: Negative for dizziness and headaches.  Hematological: Does not bruise/bleed easily.  Psychiatric/Behavioral: Negative for suicidal ideas. The patient is not nervous/anxious.     Patient Active Problem List   Diagnosis Date Noted  . COPD with acute exacerbation ([place]) 06/22/2017  . Ulcerative colitis with complication ([place]) 92/33/0076  . Cervical stenosis of spinal canal 05/05/2016  . Carotid stenosis 05/02/2016  . Atherosclerosis of native arteries of extremity with intermittent claudication ([place]) 05/02/2016  . Diarrhea 06/09/2014  . Centrilobular Emphysema 04/25/2014  . Tobacco abuse 04/25/2014    Allergies  Allergen Reactions  . Penicillins Rash    Has patient had a PCN reaction causing immediate rash, facial/tongue/throat swelling, SOB or lightheadedness with hypotension:Yes Has patient had a PCN reaction causing severe rash involving mucus membranes or skin necrosis:Yes Has patient had a PCN reaction that required hospitalization:No Has patient had a PCN reaction occurring within the last 10 years:No If all of the above answers are "NO", then may proceed with Cephalosporin use.     Past Surgical History:  Procedure Laterality Date  . ANTERIOR CERVICAL DECOMP/DISCECTOMY FUSION N/A 05/05/2016   Procedure: ANTERIOR CERVICAL DECOMPRESSION FUSION CERVICAL THREE-FOUR.;  Surgeon: Earnie Larsson, MD;  Location: [facility];  Service: Neurosurgery;  Laterality: N/A;  right side approach  . CERVICAL DISCECTOMY  04/2016   C3-C4  . COLONOSCOPY    . [place]  . leg stent Left approx 5-6 yrs ago   2 to left leg  . MENISCUS REPAIR Left 12/2013  . ROTATOR CUFF REPAIR Left 2007  . TONSILLECTOMY AND ADENOIDECTOMY  1957    Social History   Tobacco Use  . Smoking status: Former Smoker    Packs/day: 0.75    Years: 35.00    Pack years: 26.25    Types: Cigarettes, Cigars    Last attempt to quit: 06/11/2011    Years since quitting: 6.4  .  Smokeless tobacco: Never Used  . Tobacco comment: smoking cessation materials not required  Substance Use Topics  . Alcohol use: Yes    Alcohol/week: 7.0 standard drinks    Types: 7 Standard drinks or equivalent per week    Comment: 1-2 day   . Drug  use: No     Medication list has been reviewed and updated.  Current Meds  Medication Sig  . albuterol (PROVENTIL HFA;VENTOLIN HFA) 108 (90 Base) MCG/ACT inhaler Inhale 2 puffs into the lungs every 6 (six) hours as needed for wheezing or shortness of breath.  Marland Kitchen albuterol (PROVENTIL) (2.5 MG/3ML) 0.083% nebulizer solution Take 3 mLs (2.5 mg total) by nebulization every 6 (six) hours as needed for wheezing or shortness of breath.  . budesonide-formoterol (SYMBICORT) 160-4.5 MCG/ACT inhaler Inhale 2 puffs into the lungs 2 (two) times daily.  Marland Kitchen EPINEPHrine 0.3 mg/0.3 mL IJ SOAJ injection Inject 0.3 mLs (0.3 mg total) into the muscle once.  . fluticasone (FLONASE) 50 MCG/ACT nasal spray Place 2 sprays into both nostrils daily.  Marland Kitchen loperamide (IMODIUM) 2 MG capsule Take 2 mg by mouth daily as needed for diarrhea or loose stools.   . mesalamine (LIALDA) 1.2 g EC tablet Take 4 tablets (4.8 g total) by mouth daily with breakfast.  . omeprazole (PRILOSEC) 20 MG capsule   . Respiratory Therapy Supplies (FLUTTER) DEVI Use as directed  . Tiotropium Bromide Monohydrate (SPIRIVA RESPIMAT) 2.5 MCG/ACT AERS Inhale 2 puffs into the lungs daily.   Current Facility-Administered Medications for the 11/02/17 encounter (Office Visit) with [name], MD  Medication  . albuterol (PROVENTIL) (2.5 MG/3ML) 0.083% nebulizer solution 2.5 mg    PHQ 2/9 Scores 08/03/2017 04/16/2017 04/16/2017 06/17/2016  PHQ - 2 Score 1 0 0 0  PHQ- 9 Score 5 0 - -    Physical Exam  Constitutional: He is oriented to person, place, and time.  HENT:  Head: Normocephalic.  Right Ear: External ear normal.  Left Ear: External ear normal.  Nose: Nose normal.  Mouth/Throat: Oropharynx is clear and moist.  Eyes: Pupils are equal, round, and reactive to light. EOM are normal. Right eye exhibits no discharge. Left eye exhibits no discharge. Right conjunctiva is injected. Left conjunctiva is injected. No scleral icterus.  Conjunctiva  erythematous  Neck: Normal range of motion. Neck supple. No JVD present. No tracheal deviation present. No thyromegaly present.  Cardiovascular: Normal rate, regular rhythm, normal heart sounds and intact distal pulses. Exam reveals no gallop and no friction rub.  No murmur heard. Pulmonary/Chest: Breath sounds normal. No respiratory distress. He has no wheezes. He has no rales.  Abdominal: Soft. Bowel sounds are normal. He exhibits no mass. There is no hepatosplenomegaly. There is no tenderness. There is no rebound, no guarding and no CVA tenderness.  Musculoskeletal: Normal range of motion. He exhibits no edema or tenderness.  Lymphadenopathy:    He has no cervical adenopathy.  Neurological: He is alert and oriented to person, place, and time. He has normal strength and normal reflexes. No cranial nerve deficit.  Skin: Skin is warm. No rash noted.  Nursing note and vitals reviewed.   BP 122/62   Pulse 72   Ht 5' 10"  (1.778 m)   Wt 149 lb (67.6 kg)   BMI 21.38 kg/m   Assessment and Plan:  1. Acute bacterial conjunctivitis of both eyes Acute onset over 4 days. Prescribe Sodium Sulamyd 10% over 5-7 days. Continue mucinex d as needed. - sulfacetamide (BLEPH-10) 10 % ophthalmic  solution; Place 1 drop into both eyes every 3 (three) hours.  Dispense: 15 mL; Refill: 0   Dr. Otilio Miu [facility]  11/02/2017

## 2017-11-11 ENCOUNTER — Telehealth: Payer: Self-pay

## 2017-11-11 NOTE — Telephone Encounter (Signed)
Left Message for patient to call back about attending [facility].

## 2017-11-12 ENCOUNTER — Telehealth: Payer: Self-pay

## 2017-11-12 DIAGNOSIS — J449 Chronic obstructive pulmonary disease, unspecified: Secondary | ICD-10-CM

## 2017-11-12 NOTE — Telephone Encounter (Signed)
Antonio [name] has been out of [facility] due to a funeral and lot of other things that have been going on. He states he would like to be discharged from [facility].

## 2017-11-12 NOTE — Progress Notes (Signed)
Pulmonary Individual Treatment Plan  Patient Details  Name: Antonio [name] MRN: [id number] Date of Birth: 1945-11-29 Referring Provider:     Pulmonary Rehab [facility] 08/03/[facility]  Referring Provider  Simonne Maffucci MD      Initial Encounter Date:    Pulmonary Rehab from 08/03/2017 in [facility] Card[facility] and Pulmonary Rehab  Date  08/03/17      Visit Diagnosis: Chronic obstructive pulmonary disease, unspecified COPD type ([place])  Patient's Home Medications on Admission:  Current Outpatient Medications:  .  albuterol (PROVENTIL HFA;VENTOLIN HFA) 108 (90 Base) MCG/ACT inhaler, Inhale 2 puffs into the lungs every 6 (six) hours as needed for wheezing or shortness of breath., Disp: 3 Inhaler, Rfl: 3 .  albuterol (PROVENTIL) (2.5 MG/3ML) 0.083% nebulizer solution, Take 3 mLs (2.5 mg total) by nebulization every 6 (six) hours as needed for wheezing or shortness of breath., Disp: 75 mL, Rfl: 12 .  budesonide-formoterol (SYMBICORT) 160-4.5 MCG/ACT inhaler, Inhale 2 puffs into the lungs 2 (two) times daily., Disp: , Rfl:  .  EPINEPHrine 0.3 mg/0.3 mL IJ SOAJ injection, Inject 0.3 mLs (0.3 mg total) into the muscle once., Disp: 1 Device, Rfl: 1 .  fluticasone (FLONASE) 50 MCG/ACT nasal spray, Place 2 sprays into both nostrils daily., Disp: 16 g, Rfl: 6 .  loperamide (IMODIUM) 2 MG capsule, Take 2 mg by mouth daily as needed for diarrhea or loose stools. , Disp: , Rfl:  .  mesalamine (LIALDA) 1.2 g EC tablet, Take 4 tablets (4.8 g total) by mouth daily with breakfast., Disp: 360 tablet, Rfl: 2 .  omeprazole (PRILOSEC) 20 MG capsule, , Disp: , Rfl:  .  Respiratory Therapy Supplies (FLUTTER) DEVI, Use as directed, Disp: 1 each, Rfl: 0 .  sulfacetamide (BLEPH-10) 10 % ophthalmic solution, Place 1 drop into both eyes every 3 (three) hours., Disp: 15 mL, Rfl: 0 .  Tiotropium Bromide Monohydrate (SPIRIVA RESPIMAT) 2.5 MCG/ACT AERS, Inhale 2 puffs into the lungs daily., Disp: 1  Inhaler, Rfl: 0  Current Facility-Administered Medications:  .  albuterol (PROVENTIL) (2.5 MG/3ML) 0.083% nebulizer solution 2.5 mg, 2.5 mg, Nebulization, Once, [name], MD  Past Medical History: Past Medical History:  Diagnosis Date  . Allergy   . Calf pain   . COPD (chronic obstructive pulmonary disease) ([place])   . Dyspnea   . Hypokalemia   . Internal hemorrhoids   . Muscle cramp   . Muscle pain   . Pneumonia   . PONV (postoperative nausea and vomiting)   . Tubular adenoma of colon   . Ulcerative colitis ([place])     Tobacco Use: Social History   Tobacco Use  Smoking Status Former Smoker  . Packs/day: 0.75  . Years: 35.00  . Pack years: 26.25  . Types: Cigarettes, Cigars  . Last attempt to quit: 06/11/2011  . Years since quitting: 6.4  Smokeless Tobacco Never Used  Tobacco Comment   smoking cessation materials not required    Labs: Recent Review Flowsheet Data    Labs for ITP Cardiac and Pulmonary Rehab Latest Ref Rng & Units 11/08/2014 12/01/2016   Cholestrol 100 - 199 mg/dL 154 151   LDLCALC 0 - 99 mg/dL 89 91   HDL >39 mg/dL 53 50   Trlycerides 0 - 149 mg/dL 61 51       Pulmonary Assessment Scores: Pulmonary Assessment Scores    Row Name 08/03/17 1435         ADL UCSD   ADL  Phase  Entry     SOB Score total  45     Rest  0     Walk  3     Stairs  5     Bath  1     Dress  2     Shop  3       CAT Score   CAT Score  13       mMRC Score   mMRC Score  2        Pulmonary Function Assessment: Pulmonary Function Assessment - 08/03/17 1436      Pulmonary Function Tests   FVC%  59 %   test performed on 06/07/14   FEV1%  40 %    FEV1/FVC Ratio  51      Breath   Bilateral Breath Sounds  Clear    Shortness of Breath  Limiting activity;Yes       Exercise Target Goals: Exercise Program Goal: Individual exercise prescription set using results from initial 6 min walk test and THRR while considering  patient's activity barriers and safety.    Exercise Prescription Goal: Initial exercise prescription builds to 30-45 minutes a day of aerobic activity, 2-3 days per week.  Home exercise guidelines will be given to patient during program as part of exercise prescription that the participant will acknowledge.  Activity Barriers & Risk Stratification: Activity Barriers & Cardiac Risk Stratification - 08/03/17 1531      Activity Barriers & Cardiac Risk Stratification   Activity Barriers  Deconditioning;Shortness of Breath       6 Minute Walk: 6 Minute Walk    Row Name 08/03/17 1528         6 Minute Walk   Phase  Initial     Distance  1515 feet     Walk Time  6 minutes     # of Rest Breaks  0     MPH  2.87     METS  3.44     RPE  12     Perceived Dyspnea   3     VO2 Peak  12.42     Symptoms  Yes (comment)     Comments  SOB, cramp in left calf     Resting HR  63 bpm     Resting BP  134/60     Resting Oxygen Saturation   99 %     Exercise Oxygen Saturation  during 6 min walk  85 %     Max Ex. HR  74 bpm     Max Ex. BP  150/64     2 Minute Post BP  142/60       Interval HR   1 Minute HR  69     2 Minute HR  67     3 Minute HR  68     4 Minute HR  69     5 Minute HR  68     6 Minute HR  84     2 Minute Post HR  75     Interval Heart Rate?  Yes       Interval Oxygen   Interval Oxygen?  Yes     Baseline Oxygen Saturation %  99 %     1 Minute Oxygen Saturation %  95 %     1 Minute Liters of Oxygen  0 L Room Air     2 Minute Oxygen Saturation %  90 %  2 Minute Liters of Oxygen  0 L     3 Minute Oxygen Saturation %  93 %     3 Minute Liters of Oxygen  0 L     4 Minute Oxygen Saturation %  87 %     4 Minute Liters of Oxygen  0 L     5 Minute Oxygen Saturation %  89 %     5 Minute Liters of Oxygen  0 L     6 Minute Oxygen Saturation %  85 %     6 Minute Liters of Oxygen  0 L     2 Minute Post Oxygen Saturation %  97 %     2 Minute Post Liters of Oxygen  0 L       Oxygen Initial Assessment: Oxygen  Initial Assessment - 08/03/17 1439      Home Oxygen   Home Oxygen Device  None    Sleep Oxygen Prescription  None    Home Exercise Oxygen Prescription  None    Home at Rest Exercise Oxygen Prescription  None      Initial 6 min Walk   Oxygen Used  None      Program Oxygen Prescription   Program Oxygen Prescription  None      Intervention   Short Term Goals  To learn and demonstrate proper use of respiratory medications;To learn and demonstrate proper pursed lip breathing techniques or other breathing techniques.;To learn and understand importance of maintaining oxygen saturations>88%;To learn and understand importance of monitoring SPO2 with pulse oximeter and demonstrate accurate use of the pulse oximeter.    Long  Term Goals  Verbalizes importance of monitoring SPO2 with pulse oximeter and return demonstration;Maintenance of O2 saturations>88%;Exhibits proper breathing techniques, such as pursed lip breathing or other method taught during program session;Demonstrates proper use of MDI's;Compliance with respiratory medication       Oxygen Re-Evaluation: Oxygen Re-Evaluation    Row Name 08/10/17 1055 08/28/17 1041 09/21/17 1108 10/19/17 1658       Program Oxygen Prescription   Program Oxygen Prescription  None  None  None  None      Home Oxygen   Home Oxygen Device  None  None  None  None    Sleep Oxygen Prescription  None  None  None  None    Home Exercise Oxygen Prescription  None  None  None  None    Home at Rest Exercise Oxygen Prescription  None  None  None  None      Goals/Expected Outcomes   Short Term Goals  To learn and demonstrate proper use of respiratory medications;To learn and demonstrate proper pursed lip breathing techniques or other breathing techniques.;To learn and understand importance of maintaining oxygen saturations>88%;To learn and understand importance of monitoring SPO2 with pulse oximeter and demonstrate accurate use of the pulse oximeter.  To learn and  demonstrate proper use of respiratory medications;To learn and demonstrate proper pursed lip breathing techniques or other breathing techniques.;To learn and understand importance of maintaining oxygen saturations>88%;To learn and understand importance of monitoring SPO2 with pulse oximeter and demonstrate accurate use of the pulse oximeter.  To learn and demonstrate proper use of respiratory medications;To learn and demonstrate proper pursed lip breathing techniques or other breathing techniques.;To learn and understand importance of maintaining oxygen saturations>88%;To learn and understand importance of monitoring SPO2 with pulse oximeter and demonstrate accurate use of the pulse oximeter.  To learn and demonstrate proper use of respiratory medications;To learn  and demonstrate proper pursed lip breathing techniques or other breathing techniques.;To learn and understand importance of maintaining oxygen saturations>88%;To learn and understand importance of monitoring SPO2 with pulse oximeter and demonstrate accurate use of the pulse oximeter.    Long  Term Goals  Verbalizes importance of monitoring SPO2 with pulse oximeter and return demonstration;Maintenance of O2 saturations>88%;Exhibits proper breathing techniques, such as pursed lip breathing or other method taught during program session;Demonstrates proper use of MDI's;Compliance with respiratory medication  Verbalizes importance of monitoring SPO2 with pulse oximeter and return demonstration;Maintenance of O2 saturations>88%;Exhibits proper breathing techniques, such as pursed lip breathing or other method taught during program session;Demonstrates proper use of MDI's;Compliance with respiratory medication  Verbalizes importance of monitoring SPO2 with pulse oximeter and return demonstration;Maintenance of O2 saturations>88%;Exhibits proper breathing techniques, such as pursed lip breathing or other method taught during program session;Demonstrates proper  use of MDI's;Compliance with respiratory medication  Verbalizes importance of monitoring SPO2 with pulse oximeter and return demonstration;Maintenance of O2 saturations>88%;Exhibits proper breathing techniques, such as pursed lip breathing or other method taught during program session;Demonstrates proper use of MDI's;Compliance with respiratory medication    Comments  Reviewed PLB technique with pt.  Talked about how it work and it's important to maintaining his exercise saturations.    Skipper is going well with his PLB and checking his oxygen at home. One thing he can do more is check his oxygen while he is doing something strenuous. He has been in the low 90s range with his oxygen while exercising.  Kreig continues to use his PLB at home and in class during exercise. He stated he feels like he is breathing better and his O2 Sats at home are maintained above 92%.  Patient has been using his nebulizers at home as needed and has no questions about his medications. He checks his oxygen at home routinely. His oxygen does not drop below 90 percent on any exertion that he does.     Goals/Expected Outcomes  Short: Become more profiecient at using PLB.   Long: Become independent at using PLB.  Short: monitor oxygen at home with exertion. Long: maintain oxygen saturations above 88 percent independently.  Short: continue to monitor oxygen at home with exercise or exertion. Long: independently monitor oxygen sats and maintain above 88 percent.   Short: attend [facility] routinely to improve SOB. Long: maintain and exercise routine to hel with shortness of breath       Oxygen Discharge (Final Oxygen Re-Evaluation): Oxygen Re-Evaluation - 10/19/17 1658      Program Oxygen Prescription   Program Oxygen Prescription  None      Home Oxygen   Home Oxygen Device  None    Sleep Oxygen Prescription  None    Home Exercise Oxygen Prescription  None    Home at Rest Exercise Oxygen Prescription  None      Goals/Expected  Outcomes   Short Term Goals  To learn and demonstrate proper use of respiratory medications;To learn and demonstrate proper pursed lip breathing techniques or other breathing techniques.;To learn and understand importance of maintaining oxygen saturations>88%;To learn and understand importance of monitoring SPO2 with pulse oximeter and demonstrate accurate use of the pulse oximeter.    Long  Term Goals  Verbalizes importance of monitoring SPO2 with pulse oximeter and return demonstration;Maintenance of O2 saturations>88%;Exhibits proper breathing techniques, such as pursed lip breathing or other method taught during program session;Demonstrates proper use of MDI's;Compliance with respiratory medication    Comments  Patient has been  using his nebulizers at home as needed and has no questions about his medications. He checks his oxygen at home routinely. His oxygen does not drop below 90 percent on any exertion that he does.     Goals/Expected Outcomes  Short: attend [facility] routinely to improve SOB. Long: maintain and exercise routine to hel with shortness of breath       Initial Exercise Prescription: Initial Exercise Prescription - 08/03/17 1500      Date of Initial Exercise RX and Referring Provider   Date  08/03/17    Referring Provider  Simonne Maffucci MD      Treadmill   MPH  2.8    Grade  0.5    Minutes  15    METs  3.34      REL-XR   Level  2    Speed  50    Minutes  15    METs  3      T5 Nustep   Level  2    SPM  80    Minutes  15    METs  3      Prescription Details   Frequency (times per week)  2    Duration  Progress to 45 minutes of aerobic exercise without signs/symptoms of physical distress      Intensity   THRR 40-80% of Max Heartrate  96-129    Ratings of Perceived Exertion  11-13    Perceived Dyspnea  0-4      Progression   Progression  Continue to progress workloads to maintain intensity without signs/symptoms of physical distress.      Resistance  Training   Training Prescription  Yes    Weight  4 lbs    Reps  10-15       Perform Capillary Blood Glucose checks as needed.  Exercise Prescription Changes: Exercise Prescription Changes    Row Name 08/03/17 1500 08/12/17 1400 08/26/17 1500 09/09/17 1500 09/11/17 1200     Response to Exercise   Blood Pressure (Admit)  134/60  124/60  134/74  110/72  110/72   Blood Pressure (Exercise)  150/64  142/64  -  -  -   Blood Pressure (Exit)  142/60  118/64  126/64  110/60  110/60   Heart Rate (Admit)  63 bpm  63 bpm  73 bpm  78 bpm  78 bpm   Heart Rate (Exercise)  84 bpm  88 bpm  95 bpm  89 bpm  89 bpm   Heart Rate (Exit)  75 bpm  75 bpm  65 bpm  69 bpm  69 bpm   Oxygen Saturation (Admit)  99 %  95 %  95 %  94 %  94 %   Oxygen Saturation (Exercise)  85 %  90 %  95 %  93 %  93 %   Oxygen Saturation (Exit)  97 %  97 %  90 %  95 %  95 %   Rating of Perceived Exertion (Exercise)  12  13  12  10  10    Perceived Dyspnea (Exercise)  3  3  3  1  1    Symptoms  SOB, cramp in left calf  SOB  -  -  -   Comments  walk test results  first full day of exercise  -  -  -   Duration  -  Progress to 45 minutes of aerobic exercise without signs/symptoms of physical distress  Progress to 45 minutes  of aerobic exercise without signs/symptoms of physical distress  Progress to 45 minutes of aerobic exercise without signs/symptoms of physical distress  Progress to 45 minutes of aerobic exercise without signs/symptoms of physical distress   Intensity  -  THRR unchanged  THRR unchanged  THRR unchanged  THRR unchanged     Progression   Progression  -  Continue to progress workloads to maintain intensity without signs/symptoms of physical distress.  Continue to progress workloads to maintain intensity without signs/symptoms of physical distress.  Continue to progress workloads to maintain intensity without signs/symptoms of physical distress.  Continue to progress workloads to maintain intensity without signs/symptoms of  physical distress.   Average METs  -  3.58  2.4  3.77  3.77     Resistance Training   Training Prescription  -  Yes  Yes  Yes  Yes   Weight  -  4 lbs  4 lb  4 lb  4 lb   Reps  -  10-15  10-15  10-15  10-15     Interval Training   Interval Training  -  No  -  No  No     Treadmill   MPH  -  2.8  2.8  2.8  2.8   Grade  -  0.5  0.5  0.5  0.5   Minutes  -  15  15  15  15    METs  -  3.34  3.34  3.34  3.34     REL-XR   Level  -  2  -  4  4   Speed  -  77  -  -  -   Minutes  -  15  -  15  15   METs  -  5.5  -  4.2  4.2     T5 Nustep   Level  -  2  2  -  -   SPM  -  69  80  -  -   Minutes  -  15  15  -  -   METs  -  1.9  1.9  -  -     Home Exercise Plan   Plans to continue exercise at  -  -  -  [employer] (comment) [employer]   Frequency  -  -  -  -  Add 1 additional day to program exercise sessions.   Initial Home Exercises Provided  -  -  -  -  09/11/17   Row Name 09/24/17 0900 10/22/17 0900 11/05/17 1000         Response to Exercise   Blood Pressure (Admit)  114/64  130/82  132/70     Blood Pressure (Exit)  110/62  130/70  122/64     Heart Rate (Admit)  75 bpm  82 bpm  60 bpm     Heart Rate (Exercise)  95 bpm  90 bpm  95 bpm     Heart Rate (Exit)  74 bpm  71 bpm  73 bpm     Oxygen Saturation (Admit)  94 %  93 %  93 %     Oxygen Saturation (Exercise)  92 %  94 %  91 %     Oxygen Saturation (Exit)  92 %  96 %  93 %     Rating of Perceived Exertion (Exercise)  13  13  11      Perceived Dyspnea (Exercise)  3  2  2     Duration  Continue with 45 min of aerobic exercise without signs/symptoms of physical distress.  Continue with 45 min of aerobic exercise without signs/symptoms of physical distress.  Continue with 45 min of aerobic exercise without signs/symptoms of physical distress.     Intensity  THRR unchanged  THRR unchanged  THRR unchanged       Progression   Progression  Continue to progress workloads to maintain intensity without signs/symptoms of physical  distress.  Continue to progress workloads to maintain intensity without signs/symptoms of physical distress.  Continue to progress workloads to maintain intensity without signs/symptoms of physical distress.     Average METs  3.6  3.6  3.5       Resistance Training   Training Prescription  Yes  Yes  Yes     Weight  4 lb  4 lb  4 lb     Reps  10-15  10-15  10-15       Interval Training   Interval Training  No  No  No       Treadmill   MPH  2.8  2.8  2.8     Grade  0.5  0.5  0.5     Minutes  15  15  15      METs  3.34  3.34  3.34       REL-XR   Level  4  4  5      Minutes  15  15  15      METs  3.8  3.8  3.7       T5 Nustep   Level  3  3  5      SPM  80  80  80     Minutes  15  15  15      METs  2.1  2  1.9       Home Exercise Plan   Plans to continue exercise at  [employer] (comment) [employer], music (comment) new [employer], music (comment) [employer]     Frequency  Add 1 additional day to program exercise sessions.  Add 1 additional day to program exercise sessions.  Add 1 additional day to program exercise sessions.     Initial Home Exercises Provided  09/11/17  09/11/17  09/11/17        Exercise Comments: Exercise Comments    Row Name 08/10/17 1055 09/11/17 1213         Exercise Comments  First full day of exercise!  Patient was oriented to gym and equipment including functions, settings, policies, and procedures.  Patient's individual exercise prescription and treatment plan were reviewed.  All starting workloads were established based on the results of the 6 minute walk test done at initial orientation visit.  The plan for exercise progression was also introduced and progression will be customized based on patient's performance and goals.  Reviewed home exercise with pt today.  Pt plans to walk and join [employer] for exercise.  Reviewed THR, pulse, RPE, sign and symptoms, NTG use, and when to call 911 or MD.  Also discussed weather  considerations and indoor options.  Pt voiced understanding.         Exercise Goals and Review: Exercise Goals    Row Name 08/03/17 1533             Exercise Goals   Increase Physical Activity  Yes       Intervention  Provide advice, education, support and counseling about physical activity/exercise needs.;Develop an individualized exercise prescription for aerobic and resistive training based on initial evaluation findings, risk stratification, comorbidities and participant's personal goals.       Expected Outcomes  Short Term: Attend rehab on a regular basis to increase amount of physical activity.;Long Term: Add in home exercise to make exercise part of routine and to increase amount of physical activity.;Long Term: Exercising regularly at least 3-5 days a week.       Increase Strength and Stamina  Yes       Intervention  Provide advice, education, support and counseling about physical activity/exercise needs.;Develop an individualized exercise prescription for aerobic and resistive training based on initial evaluation findings, risk stratification, comorbidities and participant's personal goals.       Expected Outcomes  Short Term: Increase workloads from initial exercise prescription for resistance, speed, and METs.;Short Term: Perform resistance training exercises routinely during rehab and add in resistance training at home;Long Term: Improve cardiorespiratory fitness, muscular endurance and strength as measured by increased METs and functional capacity (6MWT)       Able to understand and use rate of perceived exertion (RPE) scale  Yes       Intervention  Provide education and explanation on how to use RPE scale       Expected Outcomes  Short Term: Able to use RPE daily in rehab to express subjective intensity level;Long Term:  Able to use RPE to guide intensity level when exercising independently       Able to understand and use Dyspnea scale  Yes       Intervention  Provide education  and explanation on how to use Dyspnea scale       Expected Outcomes  Short Term: Able to use Dyspnea scale daily in rehab to express subjective sense of shortness of breath during exertion;Long Term: Able to use Dyspnea scale to guide intensity level when exercising independently       Knowledge and understanding of Target Heart Rate Range (THRR)  Yes       Intervention  Provide education and explanation of THRR including how the numbers were predicted and where they are located for reference       Expected Outcomes  Short Term: Able to state/look up THRR;Short Term: Able to use daily as guideline for intensity in rehab;Long Term: Able to use THRR to govern intensity when exercising independently       Able to check pulse independently  Yes       Intervention  Provide education and demonstration on how to check pulse in carotid and radial arteries.;Review the importance of being able to check your own pulse for safety during independent exercise       Expected Outcomes  Short Term: Able to explain why pulse checking is important during independent exercise;Long Term: Able to check pulse independently and accurately       Understanding of Exercise Prescription  Yes       Intervention  Provide education, explanation, and written materials on patient's individual exercise prescription       Expected Outcomes  Short Term: Able to explain program exercise prescription;Long Term: Able to explain home exercise prescription to exercise independently          Exercise Goals Re-Evaluation : Exercise Goals Re-Evaluation    Row Name 08/10/17 1055 08/26/17 1502 09/09/17 1529 09/11/17 1213 09/21/17 1102     Exercise Goal Re-Evaluation   Exercise Goals Review  Understanding  of Exercise Prescription;Able to understand and use Dyspnea scale;Knowledge and understanding of Target Heart Rate Range (THRR);Able to understand and use rate of perceived exertion (RPE) scale  Increase Physical Activity;Able to understand  and use Dyspnea scale;Increase Strength and Stamina;Knowledge and understanding of Target Heart Rate Range (THRR);Able to understand and use rate of perceived exertion (RPE) scale  Increase Physical Activity;Able to understand and use rate of perceived exertion (RPE) scale;Increase Strength and Stamina;Able to understand and use Dyspnea scale  Increase Physical Activity;Increase Strength and Stamina;Able to understand and use rate of perceived exertion (RPE) scale;Able to understand and use Dyspnea scale;Knowledge and understanding of Target Heart Rate Range (THRR);Able to check pulse independently;Understanding of Exercise Prescription  Increase Physical Activity;Increase Strength and Stamina;Able to understand and use rate of perceived exertion (RPE) scale;Able to understand and use Dyspnea scale;Knowledge and understanding of Target Heart Rate Range (THRR);Able to check pulse independently;Understanding of Exercise Prescription   Comments  Reviewed RPE scale, THR and program prescription with pt today.  Pt voiced understanding and was given a copy of goals to take home.   Pt is tolerating exercise well.  Staff will monitor progress.  Hai has made steady increases in MET level.  Staff will monitor progress.  Reviewed home exercise with pt today.  Pt plans to walk and join [employer] for exercise.  Reviewed THR, pulse, RPE, sign and symptoms, NTG use, and when to call 911 or MD.  Also discussed weather considerations and indoor options.  Pt voiced understanding.  Atom is exercising at home. He reports walking and doing strength training at home 2 days a week on the days he does not come to rehab. He is monitoring HR and oxygen at home during exercise and reports that his oxygen saturations remain above 92%. He reports feeling stronger and being able to do more yard work which he enjoys.    Expected Outcomes  Short: Use RPE daily to regulate intensity.  Long: Follow program prescription in THR.  Short - attend  LW and exercise 3 days per week Long - increase overall MET level  Short - continue to attend regularly Long - increase MET level  [place] will monitor HR and O2 when walking, get info about joining gym [place] will maintain exercise on his own  Short: Continue to walk and strength train at home 2 days a week while monitoring oxygen saturations. Long: Maintain independent exercise routine upon graduation from rehab.     [place] Name 09/24/17 1005 10/19/17 1655 10/22/17 0931 11/05/17 1058       Exercise Goal Re-Evaluation   Exercise Goals Review  Increase Physical Activity;Increase Strength and Stamina;Able to understand and use rate of perceived exertion (RPE) scale;Able to understand and use Dyspnea scale  Increase Physical Activity;Increase Strength and Stamina  Increase Physical Activity;Increase Strength and Stamina;Able to understand and use rate of perceived exertion (RPE) scale;Able to understand and use Dyspnea scale  Increase Physical Activity;Increase Strength and Stamina;Able to understand and use rate of perceived exertion (RPE) scale;Able to understand and use Dyspnea scale    Comments  Kaelen is tolerating exercise well.  Staff will monitor progress.  Thoams has missed alot of class due to vacation and his brother being in the hospital. He states he is walking at home around his neighborhood for exercise.  Phillippe has missed some sessions due to vacation and family health issues.  He has maintained his previous exercise level.  Staff will monitor progress.  Liam has  improved overall METS slightly.  He doesnt attend consistently enough to progress. He has been three sessions in the month of September.    Expected Outcomes  Short - attend class consistently Long - increase overall MET level  Short: attened [facility] Monday and Friday to increase strength. Long: maintain exercise independently post [facility].  Short - attend LW consistently Long - increase MET level  Short - increase attendance  and/or exercise more on his own Long - increase MET level       Discharge Exercise Prescription (Final Exercise Prescription Changes): Exercise Prescription Changes - 11/05/17 1000      Response to Exercise   Blood Pressure (Admit)  132/70    Blood Pressure (Exit)  122/64    Heart Rate (Admit)  60 bpm    Heart Rate (Exercise)  95 bpm    Heart Rate (Exit)  73 bpm    Oxygen Saturation (Admit)  93 %    Oxygen Saturation (Exercise)  91 %    Oxygen Saturation (Exit)  93 %    Rating of Perceived Exertion (Exercise)  11    Perceived Dyspnea (Exercise)  2    Duration  Continue with 45 min of aerobic exercise without signs/symptoms of physical distress.    Intensity  THRR unchanged      Progression   Progression  Continue to progress workloads to maintain intensity without signs/symptoms of physical distress.    Average METs  3.5      Resistance Training   Training Prescription  Yes    Weight  4 lb    Reps  10-15      Interval Training   Interval Training  No      Treadmill   MPH  2.8    Grade  0.5    Minutes  15    METs  3.34      REL-XR   Level  5    Minutes  15    METs  3.7      T5 Nustep   Level  5    SPM  80    Minutes  15    METs  1.9      Home Exercise Plan   Plans to continue exercise at  [employer] (comment)   [employer]   Frequency  Add 1 additional day to program exercise sessions.    Initial Home Exercises Provided  09/11/17       Nutrition:  Target Goals: Understanding of nutrition guidelines, daily intake of sodium <1554m, cholesterol <2060m calories 30% from fat and 7% or less from saturated fats, daily to have 5 or more servings of fruits and vegetables.  Biometrics: Pre Biometrics - 08/03/17 1533      Pre Biometrics   Height  5' 10.9" (1.801 m)    Weight  144 lb 14.4 oz (65.7 kg)    Waist Circumference  36 inches    Hip Circumference  37 inches    Waist to Hip Ratio  0.97 %    BMI (Calculated)  20.26    Single Leg Stand  30  seconds        Nutrition Therapy Plan and Nutrition Goals: Nutrition Therapy & Goals - 08/31/17 1125      Nutrition Therapy   Diet  TLC    Protein (specify units)  13oz    Fiber  35 grams    Whole Grain Foods  3 servings    Saturated Fats  16 max. grams  Fruits and Vegetables  5 servings/day   8 ideal   Sodium  2000 grams      Personal Nutrition Goals   Nutrition Goal  Make protein a priority at each meal / snack. Aim for 15g/3oz of protein at meal times    Personal Goal #2  Add fats to meals and use liquid calorie sources such as Boost (continue to drink this daily) to help meet your daily nutritional requirements and prevent weight loss    Personal Goal #3  Consider eating smaller, more frequent "mini meals" to help prevent evening bloating. You may also want to limit the specific foods listed on the handout provided    Comments  He is satisfied with his CBW but does not want to lose more weight. Per his description, portion sizes at meal times are small      Intervention Plan   Intervention  Prescribe, educate and counsel regarding individualized specific dietary modifications aiming towards targeted core components such as weight, hypertension, lipid management, diabetes, heart failure and other comorbidities.;Nutrition handout(s) given to patient.   Nutrition guidelines for COPD, High calorie/ high protein nutrition therapy    Expected Outcomes  Short Term Goal: Understand basic principles of dietary content, such as calories, fat, sodium, cholesterol and nutrients.;Short Term Goal: A plan has been developed with personal nutrition goals set during dietitian appointment.;Long Term Goal: Adherence to prescribed nutrition plan.       Nutrition Assessments: Nutrition Assessments - 08/03/17 1432      MEDFICTS Scores   Pre Score  82       Nutrition Goals Re-Evaluation: Nutrition Goals Re-Evaluation    [place] Name 08/28/17 1038 08/31/17 1132 09/21/17 1119         Goals    Current Weight  146 lb (66.2 kg)  -  149 lb (67.6 kg)     Nutrition Goal  Learn to eat healthier. Maintain weight and gain strength  Add fats to meals and use liquid calorie sources such as Boost (continue to drink this daily) to help meet your daily nutritional requirements and prevent weight loss  Add fats to meals and use liquid calorie sources such as Boost (continue to drink this daily) to help meet your daily nutritional requirements and prevent weight loss     Comment  Shields states that he eats good. He wants to meat with the dietician to see if he is getting enough nutrients.  He does not wish to lose weight. Currently drinks 1 Boost/day which his wife makes into a milkshake or smoothie  Dominyck reports eating a protein at least 4 times a day (yoghurt, protein bars, boost drink, etc) and is trying to eat more frequent smaller meals. He wants to gain weight and feel stronger and stated that he has recently been able to gain 5 lbs and is feeling better.      Expected Outcome  Short: Meet with the dietician. Long: Adhere to a diet plan recommended by the RD.  He will use the suggestions provided to add calories to meals via fats and protein. He will continue to drink Boost at least once per day  Short: goal weight 155 lbs. Long: maintian healthy eating habbits to help manage weight and energy levels.        Personal Goal #2 Re-Evaluation   Personal Goal #2  -  Make protein a priority at each meal and snack. Aim for at least 15g/3oz of protein at meal times  Make protein a priority  at each meal and snack. Aim for at least 15g/3oz of protein at meal times       Personal Goal #3 Re-Evaluation   Personal Goal #3  -  Consider eating smaller, more frequent "mini meals" to help prevent evening bloating. You may also want to limit the specific foods listed on the handout provided  Consider eating smaller, more frequent "mini meals" to help prevent evening bloating. You may also want to limit the specific foods  listed on the handout provided        Nutrition Goals Discharge (Final Nutrition Goals Re-Evaluation): Nutrition Goals Re-Evaluation - 09/21/17 1119      Goals   Current Weight  149 lb (67.6 kg)    Nutrition Goal  Add fats to meals and use liquid calorie sources such as Boost (continue to drink this daily) to help meet your daily nutritional requirements and prevent weight loss    Comment  Hazaiah reports eating a protein at least 4 times a day (yoghurt, protein bars, boost drink, etc) and is trying to eat more frequent smaller meals. He wants to gain weight and feel stronger and stated that he has recently been able to gain 5 lbs and is feeling better.     Expected Outcome  Short: goal weight 155 lbs. Long: maintian healthy eating habbits to help manage weight and energy levels.       Personal Goal #2 Re-Evaluation   Personal Goal #2  Make protein a priority at each meal and snack. Aim for at least 15g/3oz of protein at meal times      Personal Goal #3 Re-Evaluation   Personal Goal #3  Consider eating smaller, more frequent "mini meals" to help prevent evening bloating. You may also want to limit the specific foods listed on the handout provided       Psychosocial: Target Goals: Acknowledge presence or absence of significant depression and/or stress, maximize coping skills, provide positive support system. Participant is able to verbalize types and ability to use techniques and skills needed for reducing stress and depression.   Initial Review & Psychosocial Screening: Initial Psych Review & Screening - 08/03/17 1429      Initial Review   Current issues with  Current Stress Concerns    Source of Stress Concerns  Chronic Illness    Comments  COPD is his main concern with his stress.      Family Dynamics   Good Support System?  Yes    Comments  He can look to his wife, dog and two childeren for support.      Barriers   Psychosocial barriers to participate in program  The patient  should benefit from training in stress management and relaxation.      Screening Interventions   Interventions  To provide support and resources with identified psychosocial needs;Encouraged to exercise;Program counselor consult;Provide feedback about the scores to participant    Expected Outcomes  Short Term goal: Utilizing psychosocial counselor, staff and physician to assist with identification of specific Stressors or current issues interfering with healing process. Setting desired goal for each stressor or current issue identified.;Long Term Goal: Stressors or current issues are controlled or eliminated.;Short Term goal: Identification and review with participant of any Quality of Life or Depression concerns found by scoring the questionnaire.;Long Term goal: The participant improves quality of Life and PHQ9 Scores as seen by post scores and/or verbalization of changes       Quality of Life Scores:  Scores of 19  and below usually indicate a poorer quality of life in these areas.  A difference of  2-3 points is a clinically meaningful difference.  A difference of 2-3 points in the total score of the Quality of Life Index has been associated with significant improvement in overall quality of life, self-image, physical symptoms, and general health in studies assessing change in quality of life.  PHQ-9: Recent Review Flowsheet Data    Depression screen [facility] 2/9 08/03/2017 04/16/2017 04/16/2017 06/17/2016 04/30/2015   Decreased Interest 1 0 0 0 0   Down, Depressed, Hopeless - 0 0 0 0   PHQ - 2 Score 1 0 0 0 0   Altered sleeping 0 0 - - -   Tired, decreased energy 2 0 - - -   Change in appetite 1 0 - - -   Feeling bad or failure about yourself  1 0 - - -   Trouble concentrating 0 0 - - -   Moving slowly or fidgety/restless 0 0 - - -   Suicidal thoughts 0 0 - - -   PHQ-9 Score 5 0 - - -   Difficult doing work/chores Somewhat difficult Not difficult at all - - -     Interpretation of Total Score   Total Score Depression Severity:  1-4 = Minimal depression, 5-9 = Mild depression, 10-14 = Moderate depression, 15-19 = Moderately severe depression, 20-27 = Severe depression   Psychosocial Evaluation and Intervention: Psychosocial Evaluation - 08/24/17 1049      Psychosocial Evaluation & Interventions   Interventions  Encouraged to exercise with the program and follow exercise prescription    Comments  Counselor met with Mr. Berhane Kerlan [facility]) today for initial psychosocial evaluation.  He is a 72 year old who has a diagnosis of COPD.  He has a strong support system with a spouse of 64 years; several adult children who live locally and active involvement in a local church.  Delson sleeps "okay" with about 5 hours most nights.  He has a good appetite and denies a history of depression or anxiety of any current symptoms.  Edrik states he is typically in a positive mood most of the time and was quite jovial today in this assessment.  He reports minimal stress in his life - other than his health at this time.  Kylyn has goals to get back on the golf course of 18 holes and breathe better - with more energy while in this program.  Staff will follow with him.      Expected Outcomes  Short:  Reeve will exercise consistently to increase his energy and breathe better.  He will attend the educational components of this program to understand and manage his condition more positively.  Long:  Pavlos will consistently exercise and practice the skills he learns in order to get back on the golf course for 18 one day!    Continue Psychosocial Services   Follow up required by staff       Psychosocial Re-Evaluation: Psychosocial Re-Evaluation    [place] Name 09/21/17 1115 10/19/17 1700           Psychosocial Re-Evaluation   Current issues with  Current Stress Concerns health concerns  Current Stress Concerns      Comments  Donney reports no new stressors or changes in his psychosocial well-being. He did report feeling like he  can do more, specifically yard work, which he enjoys. He is wanting to get back to golf. He has not yet  but is working on it.   Brenn states that his brother is in the hospital and is a very stressful time for him. He is able to exercise to get his mind off his brother for a little while. Other than his brother Cardell does not seem to have any other stressors.      Expected Outcomes  Short: Get back to golf game and other activities that he enjoys to do. Long: Maintain healthy balance with life stresses.   Short: Attend [facility] stress management education to decrease stress. Long: Maintain exercise Post [facility] to keep stress at a minimum      Interventions  -  Encouraged to attend Pulmonary Rehabilitation for the exercise      Continue Psychosocial Services   Follow up required by staff  Follow up required by staff         Psychosocial Discharge (Final Psychosocial Re-Evaluation): Psychosocial Re-Evaluation - 10/19/17 1700      Psychosocial Re-Evaluation   Current issues with  Current Stress Concerns    Comments  Arik states that his brother is in the hospital and is a very stressful time for him. He is able to exercise to get his mind off his brother for a little while. Other than his brother Brandan does not seem to have any other stressors.    Expected Outcomes  Short: Attend [facility] stress management education to decrease stress. Long: Maintain exercise Post [facility] to keep stress at a minimum    Interventions  Encouraged to attend Pulmonary Rehabilitation for the exercise    Continue Psychosocial Services   Follow up required by staff       Education: Education Goals: Education classes will be provided on a weekly basis, covering required topics. Participant will state understanding/return demonstration of topics presented.  Learning Barriers/Preferences: Learning Barriers/Preferences - 08/03/17 1438      Learning Barriers/Preferences   Learning Barriers  Sight   prescription do    Learning Preferences  None       Education Topics:  Initial Evaluation Education: - Verbal, written and demonstration of respiratory meds, oximetry and breathing techniques. Instruction on use of nebulizers and MDIs and importance of monitoring MDI activations.   Pulmonary Rehab from 09/25/2017 in [facility] Cardiac and Pulmonary Rehab  Date  08/03/17  Educator  [facility]  Instruction Review Code  1- Verbalizes Understanding      General Nutrition Guidelines/Fats and Fiber: -Group instruction provided by verbal, written material, models and posters to present the general guidelines for heart healthy nutrition. Gives an explanation and review of dietary fats and fiber.   Pulmonary Rehab from 09/25/2017 in [facility] Cardiac and Pulmonary Rehab  Date  08/24/17  Educator  CR  Instruction Review Code  1- Verbalizes Understanding      Controlling Sodium/Reading Food Labels: -Group verbal and written material supporting the discussion of sodium use in heart healthy nutrition. Review and explanation with models, verbal and written materials for utilization of the food label.   Pulmonary Rehab from 09/25/2017 in [facility] Cardiac and Pulmonary Rehab  Date  08/31/17  Educator  CR  Instruction Review Code  1- Verbalizes Understanding      Exercise Physiology & General Exercise Guidelines: - Group verbal and written instruction with models to review the exercise physiology of the cardiovascular system and associated critical values. Provides general exercise guidelines with specific guidelines to those with heart or lung disease.    Aerobic Exercise & Resistance Training: - Gives group verbal and written instruction  on the various components of exercise. Focuses on aerobic and resistive training programs and the benefits of this training and how to safely progress through these programs.   Flexibility, Balance, Mind/Body Relaxation: Provides group verbal/written instruction on the benefits of flexibility and  balance training, including mind/body exercise modes such as yoga, pilates and tai chi.  Demonstration and skill practice provided.   Pulmonary Rehab from 09/25/2017 in [facility] Cardiac and Pulmonary Rehab  Date  09/11/17  Educator  AS  Instruction Review Code  1- Verbalizes Understanding      Stress and Anxiety: - Provides group verbal and written instruction about the health risks of elevated stress and causes of high stress.  Discuss the correlation between heart/lung disease and anxiety and treatment options. Review healthy ways to manage with stress and anxiety.   Depression: - Provides group verbal and written instruction on the correlation between heart/lung disease and depressed mood, treatment options, and the stigmas associated with seeking treatment.   Exercise & Equipment Safety: - Individual verbal instruction and demonstration of equipment use and safety with use of the equipment.   Pulmonary Rehab from 09/25/2017 in [facility] Cardiac and Pulmonary Rehab  Date  08/03/17  Educator  [facility]  Instruction Review Code  1- Verbalizes Understanding      Infection Prevention: - Provides verbal and written material to individual with discussion of infection control including proper hand washing and proper equipment cleaning during exercise session.   Pulmonary Rehab from 09/25/2017 in [facility] Cardiac and Pulmonary Rehab  Date  08/03/17  Educator  [facility]  Instruction Review Code  1- Verbalizes Understanding      Falls Prevention: - Provides verbal and written material to individual with discussion of falls prevention and safety.   Pulmonary Rehab from 09/25/2017 in [facility] Cardiac and Pulmonary Rehab  Date  08/03/17  Educator  [facility]  Instruction Review Code  1- Verbalizes Understanding      Diabetes: - Individual verbal and written instruction to review signs/symptoms of diabetes, desired ranges of glucose level fasting, after meals and with exercise. Advice that pre and post exercise glucose checks  will be done for 3 sessions at entry of program.   Chronic Lung Diseases: - Group verbal and written instruction to review updates, respiratory medications, advancements in procedures and treatments. Discuss use of supplemental oxygen including available portable oxygen systems, continuous and intermittent flow rates, concentrators, personal use and safety guidelines. Review proper use of inhaler and spacers. Provide informative websites for self-education.    Energy Conservation: - Provide group verbal and written instruction for methods to conserve energy, plan and organize activities. Instruct on pacing techniques, use of adaptive equipment and posture/positioning to relieve shortness of breath.   Triggers and Exacerbations: - Group verbal and written instruction to review types of environmental triggers and ways to prevent exacerbations. Discuss weather changes, air quality and the benefits of nasal washing. Review warning signs and symptoms to help prevent infections. Discuss techniques for effective airway clearance, coughing, and vibrations.   AED/CPR: - Group verbal and written instruction with the use of models to demonstrate the basic use of the AED with the basic ABC's of resuscitation.   Pulmonary Rehab from 09/25/2017 in [facility] Cardiac and Pulmonary Rehab  Date  09/25/17  Educator  [facility]  Instruction Review Code  1- Actuary and Physiology of the Lungs: - Group verbal and written instruction with the use of models to provide basic lung anatomy and physiology related  to function, structure and complications of lung disease.   Anatomy & Physiology of the Heart: - Group verbal and written instruction and models provide basic cardiac anatomy and physiology, with the coronary electrical and arterial systems. Review of Valvular disease and Heart Failure   Cardiac Medications: - Group verbal and written instruction to review commonly prescribed  medications for heart disease. Reviews the medication, class of the drug, and side effects.   Know Your Numbers and Risk Factors: -Group verbal and written instruction about important numbers in your health.  Discussion of what are risk factors and how they play a role in the disease process.  Review of Cholesterol, Blood Pressure, Diabetes, and BMI and the role they play in your overall health.   Sleep Hygiene: -Provides group verbal and written instruction about how sleep can affect your health.  Define sleep hygiene, discuss sleep cycles and impact of sleep habits. Review good sleep hygiene tips.    Other: -Provides group and verbal instruction on various topics (see comments)    Knowledge Questionnaire Score: Knowledge Questionnaire Score - 08/03/17 1436      Knowledge Questionnaire Score   Pre Score  13/18   reviewed with patient       Core Components/Risk Factors/Patient Goals at Admission: Personal Goals and Risk Factors at Admission - 08/03/17 1443      Core Components/Risk Factors/Patient Goals on Admission    Weight Management  Yes;Weight Maintenance    Intervention  Weight Management: Develop a combined nutrition and exercise program designed to reach desired caloric intake, while maintaining appropriate intake of nutrient and fiber, sodium and fats, and appropriate energy expenditure required for the weight goal.;Weight Management: Provide education and appropriate resources to help participant work on and attain dietary goals.;Weight Management/Obesity: Establish reasonable short term and long term weight goals.    Admit Weight  144 lb 14.4 oz (65.7 kg)    Goal Weight: Short Term  145 lb (65.8 kg)    Goal Weight: Long Term  145 lb (65.8 kg)    Expected Outcomes  Short Term: Continue to assess and modify interventions until short term weight is achieved;Weight Maintenance: Understanding of the daily nutrition guidelines, which includes 25-35% calories from fat, 7% or  less cal from saturated fats, less than 278m cholesterol, less than 1.5gm of sodium, & 5 or more servings of fruits and vegetables daily;Long Term: Adherence to nutrition and physical activity/exercise program aimed toward attainment of established weight goal;Understanding recommendations for meals to include 15-35% energy as protein, 25-35% energy from fat, 35-60% energy from carbohydrates, less than 2055mof dietary cholesterol, 20-35 gm of total fiber daily;Understanding of distribution of calorie intake throughout the day with the consumption of 4-5 meals/snacks    Improve shortness of breath with ADL's  Yes    Intervention  Provide education, individualized exercise plan and daily activity instruction to help decrease symptoms of SOB with activities of daily living.    Expected Outcomes  Short Term: Improve cardiorespiratory fitness to achieve a reduction of symptoms when performing ADLs;Long Term: Be able to perform more ADLs without symptoms or delay the onset of symptoms       Core Components/Risk Factors/Patient Goals Review:  Goals and Risk Factor Review    Row Name 08/28/17 1038 09/21/17 1111           Core Components/Risk Factors/Patient Goals Review   Personal Goals Review  Weight Management/Obesity;Improve shortness of breath with ADL's  Weight Management/Obesity;Improve shortness of breath with ADL's  Review  Moksh has maintained his weight since he started the program. His ADLs have been improving at home. He cannot work in the yar if it is to hot or humid.  Aayan reports taking all meds. Math wants to gain weight and feel stronger. He has recently gained 5 lbs and stated he is able to do more around the house as he feels stronger. He has been able to do some more yard work but is still careful about being out in the heat of the day. He stated that he feels like he is breathing better and it is not limiting him as much as it used to.       Expected Outcomes  Short: Attend  [facility] regularly to improve shortness of breath with ADL's. Long: maintain independence with ADL's   Short: Attend [facility] regularly to improve shortness of breath with ADL's. Continue to take all medications and exercise to gain strength. Weight goal 155 lbs.  Long: maintain independence with ADL's          Core Components/Risk Factors/Patient Goals at Discharge (Final Review):  Goals and Risk Factor Review - 09/21/17 1111      Core Components/Risk Factors/Patient Goals Review   Personal Goals Review  Weight Management/Obesity;Improve shortness of breath with ADL's    Review  Jaxden reports taking all meds. Kolbee wants to gain weight and feel stronger. He has recently gained 5 lbs and stated he is able to do more around the house as he feels stronger. He has been able to do some more yard work but is still careful about being out in the heat of the day. He stated that he feels like he is breathing better and it is not limiting him as much as it used to.     Expected Outcomes  Short: Attend [facility] regularly to improve shortness of breath with ADL's. Continue to take all medications and exercise to gain strength. Weight goal 155 lbs.  Long: maintain independence with ADL's        ITP Comments: ITP Comments    Row Name 08/03/17 1358 08/10/17 0947 09/07/17 0849 10/05/17 0823 10/16/17 0847   ITP Comments  Medical Evaluation completed. Chart sent for review and changes to Dr. Emily Filbert Director of [facility]. Diagnosis can be found in [facility] encounter 06/22/17  30 day review completed. ITP sent to Dr. Emily Filbert Director of [place]. Continue with ITP unless changes are made by physician  30 day review completed. ITP sent to Dr. Emily Filbert Director of [place]. Continue with ITP unless changes are made by physician  30 day review completed. ITP sent to Dr. Emily Filbert Director of [place]. Continue with ITP unless changes are made by physician  Patient states he has been tied up and will return to  [facility] [facility] on Monday 10/19/17.   [place] Name 11/02/17 0828 11/12/17 1252         ITP Comments  30 day review completed. ITP sent to Dr. Emily Filbert Director of [facility]. Continue with ITP unless changes are made by physician  Ido has been out of [facility] due to a funeral and lot of other things that have been going on. He states he would like to be discharged from [facility]. Discharge ITP sent and signed by Dr. Sabra Heck.  Discharge Summary routed to PCP and Pulmonologist.         Comments: Discharge ITP

## 2017-11-12 NOTE — Progress Notes (Signed)
Discharge Progress Report  Patient Details  Name: Antonio [name] MRN: [id number] Date of Birth: 08-Jan-1946 Referring Provider:     Pulmonary Rehab from 08/03/2017 in [facility]  Referring Provider  Simonne Maffucci MD       Number of Visits: 13/36  Reason for Discharge:  Early Exit:  Personal and Lack of attendance  Smoking History:  Social History   Tobacco Use  Smoking Status Former Smoker  . Packs/day: 0.75  . Years: 35.00  . Pack years: 26.25  . Types: Cigarettes, Cigars  . Last attempt to quit: 06/11/2011  . Years since quitting: 6.4  Smokeless Tobacco Never Used  Tobacco Comment   smoking cessation materials not required    Diagnosis:  Chronic obstructive pulmonary disease, unspecified COPD type ([place])  ADL UCSD: Pulmonary Assessment Scores    Row Name 08/03/17 1435         ADL UCSD   ADL Phase  Entry     SOB Score total  45     Rest  0     Walk  3     Stairs  5     Bath  1     Dress  2     Shop  3       CAT Score   CAT Score  13       mMRC Score   mMRC Score  2        Initial Exercise Prescription: Initial Exercise Prescription - 08/03/17 1500      Date of Initial Exercise RX and Referring Provider   Date  08/03/17    Referring Provider  Simonne Maffucci MD      Treadmill   MPH  2.8    Grade  0.5    Minutes  15    METs  3.34      REL-XR   Level  2    Speed  50    Minutes  15    METs  3      T5 Nustep   Level  2    SPM  80    Minutes  15    METs  3      Prescription Details   Frequency (times per week)  2    Duration  Progress to 45 minutes of aerobic exercise without signs/symptoms of physical distress      Intensity   THRR 40-80% of Max Heartrate  96-129    Ratings of Perceived Exertion  11-13    Perceived Dyspnea  0-4      Progression   Progression  Continue to progress workloads to maintain intensity without signs/symptoms of physical distress.      Resistance Training   Training  Prescription  Yes    Weight  4 lbs    Reps  10-15       Discharge Exercise Prescription (Final Exercise Prescription Changes): Exercise Prescription Changes - 11/05/17 1000      Response to Exercise   Blood Pressure (Admit)  132/70    Blood Pressure (Exit)  122/64    Heart Rate (Admit)  60 bpm    Heart Rate (Exercise)  95 bpm    Heart Rate (Exit)  73 bpm    Oxygen Saturation (Admit)  93 %    Oxygen Saturation (Exercise)  91 %    Oxygen Saturation (Exit)  93 %    Rating of Perceived Exertion (Exercise)  11    Perceived Dyspnea (Exercise)  2    Duration  Continue with 45 min of aerobic exercise without signs/symptoms of physical distress.    Intensity  THRR unchanged      Progression   Progression  Continue to progress workloads to maintain intensity without signs/symptoms of physical distress.    Average METs  3.5      Resistance Training   Training Prescription  Yes    Weight  4 lb    Reps  10-15      Interval Training   Interval Training  No      Treadmill   MPH  2.8    Grade  0.5    Minutes  15    METs  3.34      REL-XR   Level  5    Minutes  15    METs  3.7      T5 Nustep   Level  5    SPM  80    Minutes  15    METs  1.9      Home Exercise Plan   Plans to continue exercise at  [employer] (comment)   [facility] [place]   Frequency  Add 1 additional day to program exercise sessions.    Initial Home Exercises Provided  09/11/17       Functional Capacity: 6 Minute Walk    Row Name 08/03/17 1528         6 Minute Walk   Phase  Initial     Distance  1515 feet     Walk Time  6 minutes     # of Rest Breaks  0     MPH  2.87     METS  3.44     RPE  12     Perceived Dyspnea   3     VO2 Peak  12.42     Symptoms  Yes (comment)     Comments  SOB, cramp in left calf     Resting HR  63 bpm     Resting BP  134/60     Resting Oxygen Saturation   99 %     Exercise Oxygen Saturation  during 6 min walk  85 %     Max Ex. HR  74 bpm     Max Ex. BP   150/64     2 Minute Post BP  142/60       Interval HR   1 Minute HR  69     2 Minute HR  67     3 Minute HR  68     4 Minute HR  69     5 Minute HR  68     6 Minute HR  84     2 Minute Post HR  75     Interval Heart Rate?  Yes       Interval Oxygen   Interval Oxygen?  Yes     Baseline Oxygen Saturation %  99 %     1 Minute Oxygen Saturation %  95 %     1 Minute Liters of Oxygen  0 L Room Air     2 Minute Oxygen Saturation %  90 %     2 Minute Liters of Oxygen  0 L     3 Minute Oxygen Saturation %  93 %     3 Minute Liters of Oxygen  0 L     4 Minute Oxygen Saturation %  87 %  4 Minute Liters of Oxygen  0 L     5 Minute Oxygen Saturation %  89 %     5 Minute Liters of Oxygen  0 L     6 Minute Oxygen Saturation %  85 %     6 Minute Liters of Oxygen  0 L     2 Minute Post Oxygen Saturation %  97 %     2 Minute Post Liters of Oxygen  0 L        Psychological, QOL, Others - Outcomes: PHQ 2/9: Depression screen [facility] 2/9 08/03/2017 04/16/2017 04/16/2017 06/17/2016 04/30/2015  Decreased Interest 1 0 0 0 0  Down, Depressed, Hopeless - 0 0 0 0  PHQ - 2 Score 1 0 0 0 0  Altered sleeping 0 0 - - -  Tired, decreased energy 2 0 - - -  Change in appetite 1 0 - - -  Feeling bad or failure about yourself  1 0 - - -  Trouble concentrating 0 0 - - -  Moving slowly or fidgety/restless 0 0 - - -  Suicidal thoughts 0 0 - - -  PHQ-9 Score 5 0 - - -  Difficult doing work/chores Somewhat difficult Not difficult at all - - -    Quality of Life:   Personal Goals: Goals established at orientation with interventions provided to work toward goal. Personal Goals and Risk Factors at Admission - 08/03/17 1443      Core Components/Risk Factors/Patient Goals on Admission    Weight Management  Yes;Weight Maintenance    Intervention  Weight Management: Develop a combined nutrition and exercise program designed to reach desired caloric intake, while maintaining appropriate intake of nutrient and  fiber, sodium and fats, and appropriate energy expenditure required for the weight goal.;Weight Management: Provide education and appropriate resources to help participant work on and attain dietary goals.;Weight Management/Obesity: Establish reasonable short term and long term weight goals.    Admit Weight  144 lb 14.4 oz (65.7 kg)    Goal Weight: Short Term  145 lb (65.8 kg)    Goal Weight: Long Term  145 lb (65.8 kg)    Expected Outcomes  Short Term: Continue to assess and modify interventions until short term weight is achieved;Weight Maintenance: Understanding of the daily nutrition guidelines, which includes 25-35% calories from fat, 7% or less cal from saturated fats, less than 265m cholesterol, less than 1.5gm of sodium, & 5 or more servings of fruits and vegetables daily;Long Term: Adherence to nutrition and physical activity/exercise program aimed toward attainment of established weight goal;Understanding recommendations for meals to include 15-35% energy as protein, 25-35% energy from fat, 35-60% energy from carbohydrates, less than 2073mof dietary cholesterol, 20-35 gm of total fiber daily;Understanding of distribution of calorie intake throughout the day with the consumption of 4-5 meals/snacks    Improve shortness of breath with ADL's  Yes    Intervention  Provide education, individualized exercise plan and daily activity instruction to help decrease symptoms of SOB with activities of daily living.    Expected Outcomes  Short Term: Improve cardiorespiratory fitness to achieve a reduction of symptoms when performing ADLs;Long Term: Be able to perform more ADLs without symptoms or delay the onset of symptoms        Personal Goals Discharge: Goals and Risk Factor Review    Row Name 08/28/17 1038 09/21/17 1111           Core Components/Risk Factors/Patient Goals Review   Personal  Goals Review  Weight Management/Obesity;Improve shortness of breath with ADL's  Weight  Management/Obesity;Improve shortness of breath with ADL's      Review  Edword has maintained his weight since he started the program. His ADLs have been improving at home. He cannot work in the [place] if it is to hot or humid.  Khizar reports taking all meds. Wandell wants to gain weight and feel stronger. He has recently gained 5 lbs and stated he is able to do more around the house as he feels stronger. He has been able to do some more yard work but is still careful about being out in the heat of the day. He stated that he feels like he is breathing better and it is not limiting him as much as it used to.       Expected Outcomes  Short: Attend [facility] regularly to improve shortness of breath with ADL's. Long: maintain independence with ADL's   Short: Attend [facility] regularly to improve shortness of breath with ADL's. Continue to take all medications and exercise to gain strength. Weight goal 155 lbs.  Long: maintain independence with ADL's          Exercise Goals and Review: Exercise Goals    Row Name 08/03/17 1533             Exercise Goals   Increase Physical Activity  Yes       Intervention  Provide advice, education, support and counseling about physical activity/exercise needs.;Develop an individualized exercise prescription for aerobic and resistive training based on initial evaluation findings, risk stratification, comorbidities and participant's personal goals.       Expected Outcomes  Short Term: Attend rehab on a regular basis to increase amount of physical activity.;Long Term: Add in home exercise to make exercise part of routine and to increase amount of physical activity.;Long Term: Exercising regularly at least 3-5 days a week.       Increase Strength and Stamina  Yes       Intervention  Provide advice, education, support and counseling about physical activity/exercise needs.;Develop an individualized exercise prescription for aerobic and resistive training based on initial evaluation  findings, risk stratification, comorbidities and participant's personal goals.       Expected Outcomes  Short Term: Increase workloads from initial exercise prescription for resistance, speed, and METs.;Short Term: Perform resistance training exercises routinely during rehab and add in resistance training at home;Long Term: Improve cardiorespiratory fitness, muscular endurance and strength as measured by increased METs and functional capacity (6MWT)       Able to understand and use rate of perceived exertion (RPE) scale  Yes       Intervention  Provide education and explanation on how to use RPE scale       Expected Outcomes  Short Term: Able to use RPE daily in rehab to express subjective intensity level;Long Term:  Able to use RPE to guide intensity level when exercising independently       Able to understand and use Dyspnea scale  Yes       Intervention  Provide education and explanation on how to use Dyspnea scale       Expected Outcomes  Short Term: Able to use Dyspnea scale daily in rehab to express subjective sense of shortness of breath during exertion;Long Term: Able to use Dyspnea scale to guide intensity level when exercising independently       Knowledge and understanding of Target Heart Rate Range (THRR)  Yes  Intervention  Provide education and explanation of THRR including how the numbers were predicted and where they are located for reference       Expected Outcomes  Short Term: Able to state/look up THRR;Short Term: Able to use daily as guideline for intensity in rehab;Long Term: Able to use THRR to govern intensity when exercising independently       Able to check pulse independently  Yes       Intervention  Provide education and demonstration on how to check pulse in carotid and radial arteries.;Review the importance of being able to check your own pulse for safety during independent exercise       Expected Outcomes  Short Term: Able to explain why pulse checking is important  during independent exercise;Long Term: Able to check pulse independently and accurately       Understanding of Exercise Prescription  Yes       Intervention  Provide education, explanation, and written materials on patient's individual exercise prescription       Expected Outcomes  Short Term: Able to explain program exercise prescription;Long Term: Able to explain home exercise prescription to exercise independently          Nutrition & Weight - Outcomes: Pre Biometrics - 08/03/17 1533      Pre Biometrics   Height  5' 10.9" (1.801 m)    Weight  144 lb 14.4 oz (65.7 kg)    Waist Circumference  36 inches    Hip Circumference  37 inches    Waist to Hip Ratio  0.97 %    BMI (Calculated)  20.26    Single Leg Stand  30 seconds        Nutrition: Nutrition Therapy & Goals - 08/31/17 1125      Nutrition Therapy   Diet  TLC    Protein (specify units)  13oz    Fiber  35 grams    Whole Grain Foods  3 servings    Saturated Fats  16 max. grams    Fruits and Vegetables  5 servings/day   8 ideal   Sodium  2000 grams      Personal Nutrition Goals   Nutrition Goal  Make protein a priority at each meal / snack. Aim for 15g/3oz of protein at meal times    Personal Goal #2  Add fats to meals and use liquid calorie sources such as Boost (continue to drink this daily) to help meet your daily nutritional requirements and prevent weight loss    Personal Goal #3  Consider eating smaller, more frequent "mini meals" to help prevent evening bloating. You may also want to limit the specific foods listed on the handout provided    Comments  He is satisfied with his CBW but does not want to lose more weight. Per his description, portion sizes at meal times are small      Intervention Plan   Intervention  Prescribe, educate and counsel regarding individualized specific dietary modifications aiming towards targeted core components such as weight, hypertension, lipid management, diabetes, heart failure and  other comorbidities.;Nutrition handout(s) given to patient.   Nutrition guidelines for COPD, High calorie/ high protein nutrition therapy    Expected Outcomes  Short Term Goal: Understand basic principles of dietary content, such as calories, fat, sodium, cholesterol and nutrients.;Short Term Goal: A plan has been developed with personal nutrition goals set during dietitian appointment.;Long Term Goal: Adherence to prescribed nutrition plan.       Nutrition Discharge: Nutrition Assessments -  08/03/17 1432      MEDFICTS Scores   Pre Score  82       Education Questionnaire Score: Knowledge Questionnaire Score - 08/03/17 1436      Knowledge Questionnaire Score   Pre Score  13/18   reviewed with patient      Goals reviewed with patient; copy given to patient.

## 2017-11-18 ENCOUNTER — Ambulatory Visit: Payer: PPO | Admitting: Pulmonary Disease

## 2017-11-18 ENCOUNTER — Encounter: Payer: Self-pay | Admitting: Pulmonary Disease

## 2017-11-18 VITALS — BP 110/70 | HR 81

## 2017-11-18 DIAGNOSIS — J449 Chronic obstructive pulmonary disease, unspecified: Secondary | ICD-10-CM | POA: Diagnosis not present

## 2017-11-18 MED ORDER — TIOTROPIUM BROMIDE MONOHYDRATE 2.5 MCG/ACT IN AERS: 2.0000 | INHALATION_SPRAY | Freq: Every day | RESPIRATORY_TRACT | 3 refills | Status: DC

## 2017-11-18 MED ORDER — BUDESONIDE-FORMOTEROL FUMARATE 160-4.5 MCG/ACT IN AERO: 2.0000 | INHALATION_SPRAY | Freq: Two times a day (BID) | RESPIRATORY_TRACT | 3 refills | Status: DC

## 2017-11-18 NOTE — Progress Notes (Signed)
Subjective:    Patient ID: Antonio [name], male    DOB: 01/29/1946, 72 y.o.   MRN: [id number]  Synopsis: He was referred for shortness of breath in 2016. He had a lengthy smoking history but quit smoking in 2014. April 2016 pulmonary function testing confirmed COPD which was severe with an FEV1 of 1.47 L (40% predicted).   HPI Chief Complaint  Patient presents with  . Follow-up    not much change since last visit - gets SOB with stairs.  Developed pink eye two weeks ago - saw PCP   [place] hasn't had cough or cold symptoms despite the recent diagnosis of conjunctivitis.  He's had a little runny nose in the last few days.  His breahting has been about the same.  He says that he dropped out of pulmonary rehab and plans.   He's already had his flu shot and he had the shingles vaccine.    Past Medical History:  Diagnosis Date  . Allergy   . Calf pain   . COPD (chronic obstructive pulmonary disease) ([place])   . Dyspnea   . Hypokalemia   . Internal hemorrhoids   . Muscle cramp   . Muscle pain   . Pneumonia   . PONV (postoperative nausea and vomiting)   . Tubular adenoma of colon   . Ulcerative colitis ([place])       Review of Systems  Constitutional: Negative for chills, fatigue and fever.  HENT: Negative for postnasal drip, rhinorrhea and sinus pain.   Respiratory: Positive for shortness of breath. Negative for cough and wheezing.   Cardiovascular: Negative for chest pain, palpitations and leg swelling.       Objective:   Physical Exam  Vitals:   11/18/17 1401  BP: 110/70  Pulse: 81  SpO2: 95%   RA  Gen: well appearing HENT: OP clear, TM's clear, neck supple PULM: Crackles bases B, normal percussion CV: RRR, no mgr, trace edema GI: BS+, soft, nontender Derm: no cyanosis or rash Psyche: normal mood and affect    CBC    Component Value Date/Time   WBC 10.0 05/28/2016 1450   RBC 5.01 05/28/2016 1450   HGB 15.3 05/28/2016 1450   HGB 15.2 11/26/2015 0933    HCT 45.6 05/28/2016 1450   HCT 43.9 11/26/2015 0933   PLT 277.0 05/28/2016 1450   PLT 247 11/26/2015 0933   MCV 91.1 05/28/2016 1450   MCV 90 11/26/2015 0933   MCV 95 10/01/2013 2131   MCH 31.0 04/28/2016 1433   MCHC 33.6 05/28/2016 1450   RDW 13.1 05/28/2016 1450   RDW 13.6 11/26/2015 0933   RDW 12.4 10/01/2013 2131   LYMPHSABS 2.1 05/28/2016 1450   MONOABS 1.4 (H) 05/28/2016 1450   EOSABS 0.7 05/28/2016 1450   BASOSABS 0.1 05/28/2016 1450   PFT December 2017 lung function testing showed ratio 50%, FEV1 1.47 L 43% predicted, FVC 2.96 L 67% predicted  Chest imaging: May 2019 chest x-ray showed emphysema     Assessment & Plan:   Centrilobular Emphysema  Discussion 1 continues to struggle with shortness of breath but he says that it has been stable since the last visit.  It sounds as if he has been compliant with his Symbicort recently though he questions whether or not he should be on Stiolto or Spiriva.  We clarified that today.  He continues to have some crackles in the bases of his lungs.  If he is still complaining of shortness of breath on  the next visit then we may want to repeat a lung function test and CT scan.  Plan: COPD: Continue to take Symbicort 2 puffs twice a day Continue to take Spiriva 2 puffs daily Use albuterol as needed for chest tightness wheezing or shortness of breath Practice good hand hygiene I am glad you have already had a flu shot Stay active, exercise regularly  Abnormal chest exam:  Consider repeat PFT/CT chest next visit  We will see you back in 4 to 6 months or sooner if needed  > 50% of this 15 minute visit spent face to face    Current Outpatient Medications:  .  albuterol (PROVENTIL HFA;VENTOLIN HFA) 108 (90 Base) MCG/ACT inhaler, Inhale 2 puffs into the lungs every 6 (six) hours as needed for wheezing or shortness of breath., Disp: 3 Inhaler, Rfl: 3 .  albuterol (PROVENTIL) (2.5 MG/3ML) 0.083% nebulizer solution, Take 3 mLs  (2.5 mg total) by nebulization every 6 (six) hours as needed for wheezing or shortness of breath., Disp: 75 mL, Rfl: 12 .  budesonide-formoterol (SYMBICORT) 160-4.5 MCG/ACT inhaler, Inhale 2 puffs into the lungs 2 (two) times daily., Disp: , Rfl:  .  EPINEPHrine 0.3 mg/0.3 mL IJ SOAJ injection, Inject 0.3 mLs (0.3 mg total) into the muscle once., Disp: 1 Device, Rfl: 1 .  fluticasone (FLONASE) 50 MCG/ACT nasal spray, Place 2 sprays into both nostrils daily., Disp: 16 g, Rfl: 6 .  loperamide (IMODIUM) 2 MG capsule, Take 2 mg by mouth daily as needed for diarrhea or loose stools. , Disp: , Rfl:  .  mesalamine (LIALDA) 1.2 g EC tablet, Take 4 tablets (4.8 g total) by mouth daily with breakfast., Disp: 360 tablet, Rfl: 2 .  omeprazole (PRILOSEC) 20 MG capsule, , Disp: , Rfl:  .  Respiratory Therapy Supplies (FLUTTER) DEVI, Use as directed, Disp: 1 each, Rfl: 0 .  sulfacetamide (BLEPH-10) 10 % ophthalmic solution, Place 1 drop into both eyes every 3 (three) hours., Disp: 15 mL, Rfl: 0 .  Tiotropium Bromide Monohydrate (SPIRIVA RESPIMAT) 2.5 MCG/ACT AERS, Inhale 2 puffs into the lungs daily., Disp: 1 Inhaler, Rfl: 0  Current Facility-Administered Medications:  .  albuterol (PROVENTIL) (2.5 MG/3ML) 0.083% nebulizer solution 2.5 mg, 2.5 mg, Nebulization, Once, [name], MD

## 2017-11-18 NOTE — Patient Instructions (Signed)
COPD: Continue to take Symbicort 2 puffs twice a day Continue to take Spiriva 2 puffs daily Use albuterol as needed for chest tightness wheezing or shortness of breath Practice good hand hygiene I am glad you have already had a flu shot Stay active, exercise regularly  We will see you back in 4 to 6 months or sooner if needed

## 2017-11-25 ENCOUNTER — Other Ambulatory Visit: Payer: Self-pay | Admitting: Internal Medicine

## 2017-11-25 MED ORDER — MESALAMINE 1.2 G PO TBEC: 4.8000 g | DELAYED_RELEASE_TABLET | Freq: Every day | ORAL | 2 refills | Status: DC

## 2017-11-25 NOTE — Telephone Encounter (Signed)
Prescription sent to patient's pharmacy.

## 2017-12-04 ENCOUNTER — Encounter: Payer: Self-pay | Admitting: Family Medicine

## 2018-01-10 ENCOUNTER — Ambulatory Visit
Admission: EM | Admit: 2018-01-10 | Discharge: 2018-01-10 | Disposition: A | Discharge status: Home / Self Care | Payer: PPO | Attending: Emergency Medicine | Admitting: Emergency Medicine

## 2018-01-10 ENCOUNTER — Encounter: Payer: Self-pay | Admitting: Gynecology

## 2018-01-10 ENCOUNTER — Other Ambulatory Visit: Payer: Self-pay

## 2018-01-10 ENCOUNTER — Ambulatory Visit (INDEPENDENT_AMBULATORY_CARE_PROVIDER_SITE_OTHER): Payer: PPO

## 2018-01-10 DIAGNOSIS — I700 Atherosclerosis of aorta: Secondary | ICD-10-CM | POA: Diagnosis not present

## 2018-01-10 DIAGNOSIS — J441 Chronic obstructive pulmonary disease with (acute) exacerbation: Secondary | ICD-10-CM

## 2018-01-10 DIAGNOSIS — J439 Emphysema, unspecified: Secondary | ICD-10-CM | POA: Diagnosis not present

## 2018-01-10 DIAGNOSIS — R0602 Shortness of breath: Secondary | ICD-10-CM | POA: Diagnosis not present

## 2018-01-10 MED ORDER — PREDNISONE 50 MG PO TABS
60.0000 mg | ORAL_TABLET | Freq: Once | ORAL | Status: AC
  Administered 2018-01-10: 60 mg via ORAL

## 2018-01-10 MED ORDER — IPRATROPIUM-ALBUTEROL 0.5-2.5 (3) MG/3ML IN SOLN
3.0000 mL | Freq: Once | RESPIRATORY_TRACT | Status: AC
  Administered 2018-01-10: 3 mL via RESPIRATORY_TRACT

## 2018-01-10 MED ORDER — PREDNISONE 10 MG (21) PO TBPK: ORAL_TABLET | ORAL | 0 refills | Status: DC

## 2018-01-10 NOTE — ED Provider Notes (Signed)
HPI  SUBJECTIVE:  Antonio [name] is a 72 y.o. male who presents with several days of worsening shortness of breath.  He reports having a panic attack yesterday secondary to shortness of breath.  He reports wheezing, dyspnea on exertion to 50 steps and with daily activities.  No coughing, chest pain.  No fevers, concurrent URI, body aches, flulike symptoms.  He is unsure what triggered off his COPD.  No antibiotics in the past 3 months.  No antipyretic in the past 4 to 6 hours.  He has tried his rescue inhaler twice yesterday with improvement in his symptoms.  He states that he normally takes it as needed.  He is compliant with the Symbicort and the [place].  Symptoms are better with rest, worse with exertion.  States that his home oxygen saturations have been at his baseline around 97 to 98%.  He denies unintentional weight gain, nocturia, lower extremity edema, PND, abdominal pain, orthopnea.  Past medical history of COPD.  He quit smoking 5 years ago.  No history of diabetes, hypertension, CHF.  PMD: Dr. Ronnald Ramp.  Pulmonology: Dr. Lake Bells.  Past Medical History:  Diagnosis Date  . Allergy   . Calf pain   . COPD (chronic obstructive pulmonary disease) ([place])   . Dyspnea   . Hypokalemia   . Internal hemorrhoids   . Muscle cramp   . Muscle pain   . Pneumonia   . PONV (postoperative nausea and vomiting)   . Tubular adenoma of colon   . Ulcerative colitis [facility])     Past Surgical History:  Procedure Laterality Date  . ANTERIOR CERVICAL DECOMP/DISCECTOMY FUSION N/A 05/05/2016   Procedure: ANTERIOR CERVICAL DECOMPRESSION FUSION CERVICAL THREE-FOUR.;  Surgeon: Earnie Larsson, MD;  Location: [facility];  Service: Neurosurgery;  Laterality: N/A;  right side approach  . CERVICAL DISCECTOMY  04/2016   C3-C4  . COLONOSCOPY    . [place]  . leg stent Left approx 5-6 yrs ago   2 to left leg  . MENISCUS REPAIR Left 12/2013  . ROTATOR CUFF REPAIR Left 2007  . TONSILLECTOMY AND  ADENOIDECTOMY  1957    Family History  Problem Relation Age of Onset  . Cancer Mother        leukemia  . Heart disease Father   . Heart attack Father   . Healthy Daughter   . Healthy Son   . Colon cancer Neg Hx     Social History   Tobacco Use  . Smoking status: Former Smoker    Packs/day: 0.75    Years: 35.00    Pack years: 26.25    Types: Cigarettes, Cigars    Last attempt to quit: 06/11/2011    Years since quitting: 6.5  . Smokeless tobacco: Never Used  . Tobacco comment: smoking cessation materials not required  Substance Use Topics  . Alcohol use: Yes    Alcohol/week: 7.0 standard drinks    Types: 7 Standard drinks or equivalent per week    Comment: 1-2 day   . Drug use: No     Current Facility-Administered Medications:  .  albuterol (PROVENTIL) (2.5 MG/3ML) 0.083% nebulizer solution 2.5 mg, 2.5 mg, Nebulization, Once, [name], Deanna C, MD  Current Outpatient Medications:  .  albuterol (PROVENTIL HFA;VENTOLIN HFA) 108 (90 Base) MCG/ACT inhaler, Inhale 2 puffs into the lungs every 6 (six) hours as needed for wheezing or shortness of breath., Disp: 3 Inhaler, Rfl: 3 .  budesonide-formoterol (SYMBICORT) 160-4.5 MCG/ACT inhaler, Inhale 2 puffs  into the lungs 2 (two) times daily., Disp: 3 Inhaler, Rfl: 3 .  EPINEPHrine 0.3 mg/0.3 mL IJ SOAJ injection, Inject 0.3 mLs (0.3 mg total) into the muscle once., Disp: 1 Device, Rfl: 1 .  fluticasone (FLONASE) 50 MCG/ACT nasal spray, Place 2 sprays into both nostrils daily., Disp: 16 g, Rfl: 6 .  loperamide (IMODIUM) 2 MG capsule, Take 2 mg by mouth daily as needed for diarrhea or loose stools. , Disp: , Rfl:  .  mesalamine (LIALDA) 1.2 g EC tablet, Take 4 tablets (4.8 g total) by mouth daily with breakfast., Disp: 360 tablet, Rfl: 2 .  omeprazole (PRILOSEC) 20 MG capsule, , Disp: , Rfl:  .  Respiratory Therapy Supplies (FLUTTER) DEVI, Use as directed, Disp: 1 each, Rfl: 0 .  Tiotropium Bromide Monohydrate (SPIRIVA RESPIMAT) 2.5  MCG/ACT AERS, Inhale 2 puffs into the lungs daily., Disp: 3 Inhaler, Rfl: 3 .  albuterol (PROVENTIL) (2.5 MG/3ML) 0.083% nebulizer solution, Take 3 mLs (2.5 mg total) by nebulization every 6 (six) hours as needed for wheezing or shortness of breath., Disp: 75 mL, Rfl: 12 .  predniSONE (STERAPRED UNI-PAK 21 TAB) 10 MG (21) TBPK tablet, Dispense one 6 day pack. Take as directed with food., Disp: 21 tablet, Rfl: 0  Allergies  Allergen Reactions  . Penicillins Rash    Has patient had a PCN reaction causing immediate rash, facial/tongue/throat swelling, SOB or lightheadedness with hypotension:Yes Has patient had a PCN reaction causing severe rash involving mucus membranes or skin necrosis:Yes Has patient had a PCN reaction that required hospitalization:No Has patient had a PCN reaction occurring within the last 10 years:No If all of the above answers are "NO", then may proceed with Cephalosporin use.      ROS  As noted in HPI.   Physical Exam  BP (!) 128/59 (BP Location: Left Arm)   Pulse 65   Temp 97.8 F (36.6 C) (Oral)   Resp 16   Ht 5' 11"  (1.803 m)   Wt 68 kg   SpO2 96%   BMI 20.92 kg/m   Constitutional: Well developed, well nourished, no acute distress Eyes:  EOMI, conjunctiva normal bilaterally HENT: Normocephalic, atraumatic,mucus membranes moist Respiratory: Normal inspiratory effort, poor air movement, lungs clear bilaterally.  No anterior or lateral chest wall tenderness. Cardiovascular: Normal rate and rhythm, no murmurs, rubs, gallops GI: nondistended skin: No rash, skin intact Musculoskeletal: Calves symmetric, nontender, no edema. Neurologic: Alert & oriented x 3, no focal neuro deficits Psychiatric: Speech and behavior appropriate   ED Course   Medications  ipratropium-albuterol (DUONEB) 0.5-2.5 (3) MG/3ML nebulizer solution 3 mL (3 mLs Nebulization Given 01/10/18 0957)  predniSONE (DELTASONE) tablet 60 mg (60 mg Oral Given 01/10/18 0955)    Orders Placed  This Encounter  Procedures  . DG Chest 2 View    Standing Status:   Standing    Number of Occurrences:   1    Order Specific Question:   Reason for Exam (SYMPTOM  OR DIAGNOSIS REQUIRED)    Answer:   COPD r/o PNA PTX effusion    No results found for this or any previous visit (from the past 24 hour(s)). Dg Chest 2 View  Result Date: 01/10/2018 CLINICAL DATA:  Shortness of breath.  Smoker.  COPD. EXAM: CHEST - 2 VIEW COMPARISON:  06/22/2017 FINDINGS: Normal heart size. Aortic atherosclerosis. Lungs are hyperinflated but clear. Moderate changes of emphysema. No pleural effusion or edema. No airspace opacities identified. IMPRESSION: 1. No acute findings. 2. Hyperinflation and  emphysema. 3. Aortic Atherosclerosis (ICD10-I70.0) and Emphysema (ICD10-J43.9). Electronically Signed   By: Kerby Moors M.D.   On: 01/10/2018 10:24    ED Clinical Impression  COPD exacerbation [facility])   ED Assessment/Plan  Presentation consistent with a COPD exacerbation.  Will give prednisone 60 mg, DuoNeb and check a chest x-ray per spouse's request.  Will reevaluate.  Reviewed imaging independently. Hyperflation. No pneumonia.  See radiology report for full details.  Reevaluation patient states that he feels significantly better.  He has mild wheezing in the right lower lobe.  Improved air movement.  Home with regular albuterol, 2 puffs from his albuterol inhaler using spacer or an albuterol neb every 4-6 hours for the next several days, then may back off on this as he starts feeling better.  he states that he does not need a prescription for albuterol.  6-day prednisone taper.  No indications for antibiotics at this time.  Follow-up with pulmonology as needed, to the ER if he gets worse.  Discussed  imaging, MDM, treatment plan, and plan for follow-up with patient. Discussed sn/sx that should prompt return to the ED. patient agrees with plan.   Meds ordered this encounter  Medications  . ipratropium-albuterol  (DUONEB) 0.5-2.5 (3) MG/3ML nebulizer solution 3 mL  . predniSONE (DELTASONE) tablet 60 mg  . predniSONE (STERAPRED UNI-PAK 21 TAB) 10 MG (21) TBPK tablet    Sig: Dispense one 6 day pack. Take as directed with food.    Dispense:  21 tablet    Refill:  0    *This clinic note was created using [employer]. Therefore, there may be occasional mistakes despite careful proofreading.   ?    Melynda Ripple, MD 01/10/18 1640

## 2018-01-10 NOTE — ED Triage Notes (Signed)
Patient c/o has history of COPD. Per patient x yesterday with [panic attack and shortness of breath. Pt. Patient short of breath when active.

## 2018-01-10 NOTE — Discharge Instructions (Addendum)
I would like for you to use albuterol on a scheduled basis for the next several days. 2 puffs from your albuterol inhaler using spacer or an albuterol neb every 4-6 hours , then may back off on this as you start to feel better.  6-day prednisone taper.  Your chest x-ray was negative for pneumonia or for any acute changes.

## 2018-02-01 ENCOUNTER — Ambulatory Visit (INDEPENDENT_AMBULATORY_CARE_PROVIDER_SITE_OTHER): Payer: PPO | Admitting: Family Medicine

## 2018-02-01 ENCOUNTER — Ambulatory Visit
Admission: RE | Admit: 2018-02-01 | Discharge: 2018-02-01 | Disposition: A | Discharge status: Home / Self Care | Payer: PPO | Attending: Family Medicine | Admitting: Family Medicine

## 2018-02-01 ENCOUNTER — Ambulatory Visit
Admission: RE | Admit: 2018-02-01 | Discharge: 2018-02-01 | Disposition: A | Discharge status: Home / Self Care | Payer: PPO | Source: Ambulatory Visit | Attending: Family Medicine | Admitting: Family Medicine

## 2018-02-01 ENCOUNTER — Encounter: Payer: Self-pay | Admitting: Family Medicine

## 2018-02-01 VITALS — BP 120/62 | HR 72 | Ht 71.0 in | Wt 151.0 lb

## 2018-02-01 DIAGNOSIS — M79642 Pain in left hand: Secondary | ICD-10-CM

## 2018-02-01 DIAGNOSIS — J449 Chronic obstructive pulmonary disease, unspecified: Secondary | ICD-10-CM | POA: Diagnosis not present

## 2018-02-01 MED ORDER — ALBUTEROL SULFATE (2.5 MG/3ML) 0.083% IN NEBU: 2.5000 mg | INHALATION_SOLUTION | Freq: Four times a day (QID) | RESPIRATORY_TRACT | 12 refills | Status: DC | PRN

## 2018-02-01 MED ORDER — MELOXICAM 15 MG PO TABS: 15.0000 mg | ORAL_TABLET | Freq: Every day | ORAL | 0 refills | Status: DC

## 2018-02-01 NOTE — Progress Notes (Signed)
Date:  02/01/2018   Name:  Antonio [name]   DOB:  May 05, 1945   MRN:  [id number]   Chief Complaint: hand cramps (L) thumb will draw up with a cramp and it hurts to straighten it out.- If sending him to ortho- send to [place]) and COPD (needs refill on neb solution)  COPD  He complains of shortness of breath and wheezing. There is no chest tightness, cough, difficulty breathing, hemoptysis, hoarse voice or sputum production. This is a chronic problem. The current episode started more than 1 year ago. The problem occurs intermittently. The problem has been waxing and waning. Associated symptoms include dyspnea on exertion. Pertinent negatives include no appetite change, chest pain, ear pain, fever, headaches, heartburn, malaise/fatigue, myalgias, rhinorrhea, sneezing or sore throat. His symptoms are aggravated by change in weather, pollen and URI. His symptoms are alleviated by beta-agonist. His past medical history is significant for COPD.  Hand Pain   The incident occurred more than 1 week ago (1 year). There was no injury mechanism. The pain is present in the left hand and right hand. The pain has been intermittent since the incident. Pertinent negatives include no chest pain, muscle weakness, numbness or tingling. Nothing aggravates the symptoms. He has tried NSAIDs for the symptoms. The treatment provided moderate relief.    Review of Systems  Constitutional: Negative for appetite change, chills, fever and malaise/fatigue.  HENT: Negative for drooling, ear discharge, ear pain, hoarse voice, rhinorrhea, sneezing and sore throat.   Respiratory: Positive for shortness of breath and wheezing. Negative for cough, hemoptysis and sputum production.   Cardiovascular: Positive for dyspnea on exertion. Negative for chest pain, palpitations and leg swelling.  Gastrointestinal: Negative for abdominal pain, blood in stool, constipation, diarrhea, heartburn and nausea.  Endocrine: Negative for  polydipsia.  Genitourinary: Negative for dysuria, frequency, hematuria and urgency.  Musculoskeletal: Negative for back pain, myalgias and neck pain.  Skin: Negative for rash.  Allergic/Immunologic: Negative for environmental allergies.  Neurological: Negative for dizziness, tingling, numbness and headaches.  Hematological: Does not bruise/bleed easily.  Psychiatric/Behavioral: Negative for suicidal ideas. The patient is not nervous/anxious.     Patient Active Problem List   Diagnosis Date Noted  . COPD with acute exacerbation ([place]) 06/22/2017  . Ulcerative colitis with complication ([place]) 58/85/0277  . Cervical stenosis of spinal canal 05/05/2016  . Carotid stenosis 05/02/2016  . Atherosclerosis of native arteries of extremity with intermittent claudication ([place]) 05/02/2016  . Diarrhea 06/09/2014  . Centrilobular Emphysema 04/25/2014  . Tobacco abuse 04/25/2014    Allergies  Allergen Reactions  . Penicillins Rash    Has patient had a PCN reaction causing immediate rash, facial/tongue/throat swelling, SOB or lightheadedness with hypotension:Yes Has patient had a PCN reaction causing severe rash involving mucus membranes or skin necrosis:Yes Has patient had a PCN reaction that required hospitalization:No Has patient had a PCN reaction occurring within the last 10 years:No If all of the above answers are "NO", then may proceed with Cephalosporin use.     Past Surgical History:  Procedure Laterality Date  . ANTERIOR CERVICAL DECOMP/DISCECTOMY FUSION N/A 05/05/2016   Procedure: ANTERIOR CERVICAL DECOMPRESSION FUSION CERVICAL THREE-FOUR.;  Surgeon: Earnie Larsson, MD;  Location: [facility];  Service: Neurosurgery;  Laterality: N/A;  right side approach  . CERVICAL DISCECTOMY  04/2016   C3-C4  . COLONOSCOPY    . [place]  . leg stent Left approx 5-6 yrs ago   2 to left leg  .  MENISCUS REPAIR Left 12/2013  . ROTATOR CUFF REPAIR Left 2007  . TONSILLECTOMY AND ADENOIDECTOMY   1957    Social History   Tobacco Use  . Smoking status: Former Smoker    Packs/day: 0.75    Years: 35.00    Pack years: 26.25    Types: Cigarettes, Cigars    Last attempt to quit: 06/11/2011    Years since quitting: 6.6  . Smokeless tobacco: Never Used  . Tobacco comment: smoking cessation materials not required  Substance Use Topics  . Alcohol use: Yes    Alcohol/week: 7.0 standard drinks    Types: 7 Standard drinks or equivalent per week    Comment: 1-2 day   . Drug use: No     Medication list has been reviewed and updated.  Current Meds  Medication Sig  . albuterol (PROVENTIL HFA;VENTOLIN HFA) 108 (90 Base) MCG/ACT inhaler Inhale 2 puffs into the lungs every 6 (six) hours as needed for wheezing or shortness of breath.  Marland Kitchen albuterol (PROVENTIL) (2.5 MG/3ML) 0.083% nebulizer solution Take 3 mLs (2.5 mg total) by nebulization every 6 (six) hours as needed for wheezing or shortness of breath.  . budesonide-formoterol (SYMBICORT) 160-4.5 MCG/ACT inhaler Inhale 2 puffs into the lungs 2 (two) times daily.  Marland Kitchen EPINEPHrine 0.3 mg/0.3 mL IJ SOAJ injection Inject 0.3 mLs (0.3 mg total) into the muscle once.  . fluticasone (FLONASE) 50 MCG/ACT nasal spray Place 2 sprays into both nostrils daily.  Marland Kitchen loperamide (IMODIUM) 2 MG capsule Take 2 mg by mouth daily as needed for diarrhea or loose stools.   . mesalamine (LIALDA) 1.2 g EC tablet Take 4 tablets (4.8 g total) by mouth daily with breakfast.  . omeprazole (PRILOSEC) 20 MG capsule   . Respiratory Therapy Supplies (FLUTTER) DEVI Use as directed  . Tiotropium Bromide Monohydrate (SPIRIVA RESPIMAT) 2.5 MCG/ACT AERS Inhale 2 puffs into the lungs daily.   Current Facility-Administered Medications for the 02/01/18 encounter (Office Visit) with [name], MD  Medication  . albuterol (PROVENTIL) (2.5 MG/3ML) 0.083% nebulizer solution 2.5 mg    PHQ 2/9 Scores 08/03/2017 04/16/2017 04/16/2017 06/17/2016  PHQ - 2 Score 1 0 0 0  PHQ- 9 Score 5 0  - -    Physical Exam Vitals signs and nursing note reviewed.  HENT:     Head: Normocephalic.     Right Ear: External ear normal.     Left Ear: External ear normal.     Nose: Nose normal.  Eyes:     General: No scleral icterus.       Right eye: No discharge.        Left eye: No discharge.     Conjunctiva/sclera: Conjunctivae normal.     Pupils: Pupils are equal, round, and reactive to light.  Neck:     Musculoskeletal: Normal range of motion and neck supple.     Thyroid: No thyromegaly.     Vascular: No JVD.     Trachea: No tracheal deviation.  Cardiovascular:     Rate and Rhythm: Normal rate and regular rhythm.     Heart sounds: Normal heart sounds. No murmur. No friction rub. No gallop.   Pulmonary:     Effort: No respiratory distress.     Breath sounds: Normal breath sounds. No wheezing or rales.  Abdominal:     General: Bowel sounds are normal.     Palpations: Abdomen is soft. There is no mass.     Tenderness: There is no abdominal  tenderness. There is no guarding or rebound.  Musculoskeletal: Normal range of motion.     Left hand: He exhibits tenderness and bony tenderness. Decreased sensation is not present in the ulnar distribution, is not present in the medial redistribution and is not present in the radial distribution. He exhibits no finger abduction and no wrist extension trouble.       Hands:     Comments: Tenderness MP joint  Lymphadenopathy:     Cervical: No cervical adenopathy.  Skin:    General: Skin is warm.     Findings: No rash.  Neurological:     Mental Status: He is alert and oriented to person, place, and time.     Cranial Nerves: No cranial nerve deficit.     Deep Tendon Reflexes: Reflexes are normal and symmetric.     BP 120/62   Pulse 72   Ht 5' 11"  (1.803 m)   Wt 151 lb (68.5 kg)   BMI 21.06 kg/m   Assessment and Plan: 1. Centrilobular Emphysema Chronic.  Controlled.  Will continue albuterol nebulizer every 6 hours as needed for  episodes of wheezing and dyspnea. - albuterol (PROVENTIL) (2.5 MG/3ML) 0.083% nebulizer solution; Take 3 mLs (2.5 mg total) by nebulization every 6 (six) hours as needed for wheezing or shortness of breath.  Dispense: 75 mL; Refill: 12  2. Left hand pain New onset.  Patient experiencing "cramps" base of the left thumb times involving the left index finger.  Redness noted in the MCP joint.  Will initiate meloxicam 15 mg once a day.  Will refer to Dr. Clemens Catholic. - DG Hand Complete Left; Future - meloxicam (MOBIC) 15 MG tablet; Take 1 tablet (15 mg total) by mouth daily.  Dispense: 30 tablet; Refill: 0 - Ambulatory referral to Orthopedic Surgery

## 2018-02-12 ENCOUNTER — Ambulatory Visit (INDEPENDENT_AMBULATORY_CARE_PROVIDER_SITE_OTHER): Payer: PPO | Admitting: Family Medicine

## 2018-02-12 ENCOUNTER — Encounter: Payer: Self-pay | Admitting: Family Medicine

## 2018-02-12 VITALS — BP 120/70 | HR 74 | Ht 71.0 in | Wt 149.0 lb

## 2018-02-12 DIAGNOSIS — J209 Acute bronchitis, unspecified: Secondary | ICD-10-CM | POA: Diagnosis not present

## 2018-02-12 DIAGNOSIS — J440 Chronic obstructive pulmonary disease with acute lower respiratory infection: Secondary | ICD-10-CM

## 2018-02-12 MED ORDER — DOXYCYCLINE HYCLATE 100 MG PO TABS: 100.0000 mg | ORAL_TABLET | Freq: Two times a day (BID) | ORAL | 0 refills | Status: DC

## 2018-02-12 MED ORDER — PREDNISONE 10 MG PO TABS: 10.0000 mg | ORAL_TABLET | Freq: Every day | ORAL | 0 refills | Status: DC

## 2018-02-12 MED ORDER — IPRATROPIUM-ALBUTEROL 0.5-2.5 (3) MG/3ML IN SOLN: 3.0000 mL | Freq: Four times a day (QID) | RESPIRATORY_TRACT | 0 refills | Status: DC | PRN

## 2018-02-12 MED ORDER — GUAIFENESIN-CODEINE 100-10 MG/5ML PO SYRP: 5.0000 mL | ORAL_SOLUTION | Freq: Three times a day (TID) | ORAL | 0 refills | Status: DC | PRN

## 2018-02-12 MED ORDER — LEVOFLOXACIN 500 MG PO TABS: 500.0000 mg | ORAL_TABLET | Freq: Every day | ORAL | 0 refills | Status: DC

## 2018-02-12 NOTE — Progress Notes (Signed)
Date:  02/12/2018   Name:  Antonio [name]   DOB:  1945-03-30   MRN:  [id number]   Chief Complaint: Shortness of Breath (can't lay flat/ gets worse, runny nose and drainage in the am, cough without production) and Insomnia (not getting but 2 hours of sleep)  Shortness of Breath  This is a chronic problem. The current episode started in the past 7 days. The problem occurs daily. The problem has been waxing and waning. Pertinent negatives include no abdominal pain, chest pain, ear pain, fever, headaches, leg swelling, neck pain, rash, sore throat or wheezing.  Insomnia     Review of Systems  Constitutional: Negative for chills and fever.  HENT: Negative for drooling, ear discharge, ear pain and sore throat.   Respiratory: Positive for shortness of breath. Negative for cough and wheezing.   Cardiovascular: Negative for chest pain, palpitations and leg swelling.  Gastrointestinal: Negative for abdominal pain, blood in stool, constipation, diarrhea and nausea.  Endocrine: Negative for polydipsia.  Genitourinary: Negative for dysuria, frequency, hematuria and urgency.  Musculoskeletal: Negative for back pain, myalgias and neck pain.  Skin: Negative for rash.  Allergic/Immunologic: Negative for environmental allergies.  Neurological: Negative for dizziness and headaches.  Hematological: Does not bruise/bleed easily.  Psychiatric/Behavioral: Negative for suicidal ideas. The patient has insomnia. The patient is not nervous/anxious.     Patient Active Problem List   Diagnosis Date Noted  . COPD with acute exacerbation ([place]) 06/22/2017  . Ulcerative colitis with complication ([place]) 79/39/0300  . Cervical stenosis of spinal canal 05/05/2016  . Carotid stenosis 05/02/2016  . Atherosclerosis of native arteries of extremity with intermittent claudication ([place]) 05/02/2016  . Diarrhea 06/09/2014  . Centrilobular Emphysema 04/25/2014  . Tobacco abuse 04/25/2014    Allergies  Allergen  Reactions  . Penicillins Rash    Has patient had a PCN reaction causing immediate rash, facial/tongue/throat swelling, SOB or lightheadedness with hypotension:Yes Has patient had a PCN reaction causing severe rash involving mucus membranes or skin necrosis:Yes Has patient had a PCN reaction that required hospitalization:No Has patient had a PCN reaction occurring within the last 10 years:No If all of the above answers are "NO", then may proceed with Cephalosporin use.     Past Surgical History:  Procedure Laterality Date  . ANTERIOR CERVICAL DECOMP/DISCECTOMY FUSION N/A 05/05/2016   Procedure: ANTERIOR CERVICAL DECOMPRESSION FUSION CERVICAL THREE-FOUR.;  Surgeon: Earnie Larsson, MD;  Location: [facility];  Service: Neurosurgery;  Laterality: N/A;  right side approach  . CERVICAL DISCECTOMY  04/2016   C3-C4  . COLONOSCOPY    . [facility]  . leg stent Left approx 5-6 yrs ago   2 to left leg  . MENISCUS REPAIR Left 12/2013  . ROTATOR CUFF REPAIR Left 2007  . TONSILLECTOMY AND ADENOIDECTOMY  1957    Social History   Tobacco Use  . Smoking status: Former Smoker    Packs/day: 0.75    Years: 35.00    Pack years: 26.25    Types: Cigarettes, Cigars    Last attempt to quit: 06/11/2011    Years since quitting: 6.6  . Smokeless tobacco: Never Used  . Tobacco comment: smoking cessation materials not required  Substance Use Topics  . Alcohol use: Yes    Alcohol/week: 7.0 standard drinks    Types: 7 Standard drinks or equivalent per week    Comment: 1-2 day   . Drug use: No     Medication list has been reviewed and  updated.  Current Meds  Medication Sig  . albuterol (PROVENTIL HFA;VENTOLIN HFA) 108 (90 Base) MCG/ACT inhaler Inhale 2 puffs into the lungs every 6 (six) hours as needed for wheezing or shortness of breath.  Marland Kitchen albuterol (PROVENTIL) (2.5 MG/3ML) 0.083% nebulizer solution Take 3 mLs (2.5 mg total) by nebulization every 6 (six) hours as needed for wheezing or shortness  of breath.  . budesonide-formoterol (SYMBICORT) 160-4.5 MCG/ACT inhaler Inhale 2 puffs into the lungs 2 (two) times daily.  Marland Kitchen EPINEPHrine 0.3 mg/0.3 mL IJ SOAJ injection Inject 0.3 mLs (0.3 mg total) into the muscle once.  . fluticasone (FLONASE) 50 MCG/ACT nasal spray Place 2 sprays into both nostrils daily.  Marland Kitchen loperamide (IMODIUM) 2 MG capsule Take 2 mg by mouth daily as needed for diarrhea or loose stools.   . meloxicam (MOBIC) 15 MG tablet Take 1 tablet (15 mg total) by mouth daily.  . mesalamine (LIALDA) 1.2 g EC tablet Take 4 tablets (4.8 g total) by mouth daily with breakfast.  . omeprazole (PRILOSEC) 20 MG capsule   . Respiratory Therapy Supplies (FLUTTER) DEVI Use as directed  . Tiotropium Bromide Monohydrate (SPIRIVA RESPIMAT) 2.5 MCG/ACT AERS Inhale 2 puffs into the lungs daily.   Current Facility-Administered Medications for the 02/12/18 encounter (Office Visit) with [name], MD  Medication  . albuterol (PROVENTIL) (2.5 MG/3ML) 0.083% nebulizer solution 2.5 mg    PHQ 2/9 Scores 08/03/2017 04/16/2017 04/16/2017 06/17/2016  PHQ - 2 Score 1 0 0 0  PHQ- 9 Score 5 0 - -    Physical Exam Vitals signs and nursing note reviewed.  HENT:     Head: Normocephalic.     Right Ear: External ear normal.     Left Ear: External ear normal.     Nose: Nose normal.  Eyes:     General: No scleral icterus.       Right eye: No discharge.        Left eye: No discharge.     Conjunctiva/sclera: Conjunctivae normal.     Pupils: Pupils are equal, round, and reactive to light.  Neck:     Musculoskeletal: Normal range of motion and neck supple.     Thyroid: No thyromegaly.     Vascular: No JVD.     Trachea: No tracheal deviation.  Cardiovascular:     Rate and Rhythm: Normal rate and regular rhythm.     Heart sounds: Normal heart sounds. No murmur. No friction rub. No gallop.   Pulmonary:     Effort: No respiratory distress.     Breath sounds: Normal breath sounds. No wheezing or rales.    Abdominal:     General: Bowel sounds are normal.     Palpations: Abdomen is soft. There is no mass.     Tenderness: There is no abdominal tenderness. There is no guarding or rebound.  Musculoskeletal: Normal range of motion.        General: No tenderness.  Lymphadenopathy:     Cervical: No cervical adenopathy.  Skin:    General: Skin is warm.     Findings: No rash.  Neurological:     Mental Status: He is alert and oriented to person, place, and time.     Cranial Nerves: No cranial nerve deficit.     Deep Tendon Reflexes: Reflexes are normal and symmetric.     BP 120/70   Pulse 74   Ht 5' 11"  (1.803 m)   Wt 149 lb (67.6 kg)   SpO2 94%  BMI 20.78 kg/m   Assessment and Plan:  1. COPD with acute bronchitis (HCC) Chronic.  Acute exacerbation and acute bronchitis.  Will substitute DuoNeb for albuterol" to see if this may improve patient.  [facility] given as needed for cough suppression and was placed on 10 mg prednisone once a day for 2-week.  Patient was started on doxycycline 100 mg twice a day. - ipratropium-albuterol (DUONEB) 0.5-2.5 (3) MG/3ML SOLN; Take 3 mLs by nebulization every 6 (six) hours as needed.  Dispense: 360 mL; Refill: 0 - guaiFENesin-codeine (ROBITUSSIN AC) 100-10 MG/5ML syrup; Take 5 mLs by mouth 3 (three) times daily as needed for cough.  Dispense: 100 mL; Refill: 0 - predniSONE (DELTASONE) 10 MG tablet; Take 1 tablet (10 mg total) by mouth daily with breakfast.  Dispense: 30 tablet; Refill: 0 - doxycycline (VIBRA-TABS) 100 MG tablet; Take 1 tablet (100 mg total) by mouth 2 (two) times daily.  Dispense: 20 tablet; Refill: 0

## 2018-02-16 ENCOUNTER — Telehealth: Payer: Self-pay | Admitting: Internal Medicine

## 2018-02-16 MED ORDER — MESALAMINE 1.2 G PO TBEC: 4.8000 g | DELAYED_RELEASE_TABLET | Freq: Every day | ORAL | 1 refills | Status: DC

## 2018-02-16 NOTE — Telephone Encounter (Signed)
Patient requesting a written prescription of medication lialda be sent to him in the mail so he can find the best pharmacy price.

## 2018-02-16 NOTE — Telephone Encounter (Signed)
Rx mailed to patient.

## 2018-03-11 ENCOUNTER — Encounter: Payer: Self-pay | Admitting: Pulmonary Disease

## 2018-03-11 ENCOUNTER — Ambulatory Visit: Payer: PPO | Admitting: Pulmonary Disease

## 2018-03-11 VITALS — BP 130/54 | HR 50 | Temp 97.5°F | Ht 71.0 in | Wt 147.6 lb

## 2018-03-11 DIAGNOSIS — J449 Chronic obstructive pulmonary disease, unspecified: Secondary | ICD-10-CM | POA: Diagnosis not present

## 2018-03-11 DIAGNOSIS — R942 Abnormal results of pulmonary function studies: Secondary | ICD-10-CM | POA: Insufficient documentation

## 2018-03-11 MED ORDER — TIOTROPIUM BROMIDE MONOHYDRATE 2.5 MCG/ACT IN AERS: 2.0000 | INHALATION_SPRAY | Freq: Every day | RESPIRATORY_TRACT | 3 refills | Status: DC

## 2018-03-11 MED ORDER — BUDESONIDE-FORMOTEROL FUMARATE 160-4.5 MCG/ACT IN AERO: 2.0000 | INHALATION_SPRAY | Freq: Two times a day (BID) | RESPIRATORY_TRACT | 3 refills | Status: DC

## 2018-03-11 MED ORDER — PREDNISONE 10 MG PO TABS: ORAL_TABLET | ORAL | 0 refills | Status: DC

## 2018-03-11 MED ORDER — AZITHROMYCIN 250 MG PO TABS: ORAL_TABLET | ORAL | 0 refills | Status: DC

## 2018-03-11 NOTE — Assessment & Plan Note (Addendum)
Assessment: 2016 PFT shows severe obstruction with a DLCO of 44 No CT imaging to evaluate emphysema Former smoker Multiple exacerbations regarding COPD in the last 2 months Maintained on Symbicort 160 as well as Spiriva 2.5 Lungs clear to auscultation, with basilar crackles  Plan: Z-Pak today Prednisone taper today Can continue to use duo nebs every 8 hours as needed for shortness of breath and / or wheezing We will order pulmonary function testing to be completed prior to office visit with Dr. [place] Bells >>> May need to consider switching maintenance inhalers to nebulized medications after pulmonary function testing We will order high-resolution CT scan to evaluate for emphysema as well as basilar crackles on exam today Keep follow-up with Dr. [place] Bells in March/2020

## 2018-03-11 NOTE — Patient Instructions (Addendum)
Walk in office just to check to make sure oxygenation is stable with exertion  Azithromycin 262m tablet  >>>Take 2 tablets (5058mtotal) today, and then 1 tablet (25029mfor the next four days  >>>take with food  >>>can also take probiotic and / or yogurt while on antibiotic   Prednisone 14m35mblet  >>>4 tabs for 2 days, then 3 tabs for 2 days, 2 tabs for 2 days, then 1 tab for 2 days, then stop >>>take with food  >>>take in the morning   We will order high-resolution CT scan to be completed prior to March/2020 appointment with Dr. [place] Bells will order pulmonary function testing to be completed prior to office visit with Dr. [place] BellsMarch/2020  Continue Symbicort 160 >>> 2 puffs in the morning right when you wake up, rinse out your mouth after use, 12 hours later 2 puffs, rinse after use >>> Take this daily, no matter what >>> This is not a rescue inhaler   Continue Spiriva Respimat 2.5 >>> 2 puffs daily >>> Do this every day >>>This is not a rescue inhaler  Can use DuoNeb nebulized medications every to 8 hours as needed for shortness of breath and wheezing  Note your daily symptoms > remember "red flags" for COPD:   >>>Increase in cough >>>increase in sputum production >>>increase in shortness of breath or activity  intolerance.   If you notice these symptoms, please call the office to be seen.    It is flu season:   >>>Remember to be washing your hands regularly, using hand sanitizer, be careful to use around herself with has contact with people who are sick will increase her chances of getting sick yourself. >>> Best ways to protect herself from the flu: Receive the yearly flu vaccine, practice good hand hygiene washing with soap and also using hand sanitizer when available, eat a nutritious meals, get adequate rest, hydrate appropriately   Please contact the office if your symptoms worsen or you have concerns that you are not improving.   Thank you for choosing  [facility] for your healthcare, and for allowing us [facility] with you on your healthcare journey. I am thankful to be able to provide care to you today.   BriaWyn Quaker-C    Chronic Obstructive Pulmonary Disease Exacerbation Chronic obstructive pulmonary disease (COPD) is a long-term (chronic) lung problem. In COPD, the flow of air from the lungs is limited. COPD exacerbations are times that breathing gets worse and you need more than your normal treatment. Without treatment, they can be life threatening. If they happen often, your lungs can become more damaged. If your COPD gets worse, your doctor may treat you with:  Medicines.  Oxygen.  Different ways to clear your airway, such as using a mask. Follow these instructions at home: Medicines  Take over-the-counter and prescription medicines only as told by your doctor.  If you take an antibiotic or steroid medicine, do not stop taking the medicine even if you start to feel better.  Keep up with shots (vaccinations) as told by your doctor. Be sure to get a yearly (annual) flu shot. Lifestyle  Do not smoke. If you need help quitting, ask your doctor.  Eat healthy foods.  Exercise regularly.  Get plenty of sleep.  Avoid tobacco smoke and other things that can bother your lungs.  Wash your hands often with soap and water. This will help keep you from getting an infection. If you cannot use soap  and water, use hand sanitizer.  During flu season, avoid areas that are crowded with people. General instructions  Drink enough fluid to keep your pee (urine) clear or pale yellow. Do not do this if your doctor has told you not to.  Use a cool mist machine (vaporizer).  If you use oxygen or a machine that turns medicine into a mist (nebulizer), continue to use it as told.  Follow all instructions for rehabilitation. These are steps you can take to make your body work better.  Keep all follow-up visits as told by your  doctor. This is important. Contact a doctor if:  Your COPD symptoms get worse than normal. Get help right away if:  You are short of breath and it gets worse.  You have trouble talking.  You have chest pain.  You cough up blood.  You have a fever.  You keep throwing up (vomiting).  You feel weak or you pass out (faint).  You feel confused.  You are not able to sleep because of your symptoms.  You are not able to do daily activities. Summary  COPD exacerbations are times that breathing gets worse and you need more treatment than normal.  COPD exacerbations can be very serious and may cause your lungs to become more damaged.  Do not smoke. If you need help quitting, ask your doctor.  Stay up-to-date on your shots. Get a flu shot every year. This information is not intended to replace advice given to you by your health care provider. Make sure you discuss any questions you have with your health care provider. Document Released: 01/16/2011 Document Revised: 03/03/2016 Document Reviewed: 03/03/2016 Elsevier Interactive Patient Education  2019 [employer].

## 2018-03-11 NOTE — Assessment & Plan Note (Signed)
Assessment: 2016 DLCO is 44 Bibasilar crackles on exam today  Plan: Walk in office today patient on room air did not desaturate We will order high-resolution CT We will order new pulmonary function testing Follow-up with Dr. Tia Masker

## 2018-03-11 NOTE — Progress Notes (Signed)
@Patient  ID: Antonio [name], male    DOB: 23-Aug-1945, 73 y.o.   MRN: [id number]  Chief Complaint  Patient presents with  . Acute Visit    shortness of breath    Referring provider: [name], MD  HPI:  73 year old male former smoker followed in our office for severe COPD with emphysema  PMH: CAD Smoker/ Smoking History: Former smoker quit 2013.  26.25 pack years Maintenance: Symbicort 160, Spiriva 2.5 Pt of: Dr. [place] Bells  03/11/2018  - Visit   73 year old male former smoker presenting to our office today for an acute visit.  Patient reports he has had 3 days of increased dyspnea, shortness of breath, wheezing, increased mucus but he is unable to cough it up.  Patient denies cough or increased sputum production.  Chart review reveals the patient was seen in the emergency room in December/2019 for COPD exacerbation and was treated 1 month later (January/2020) by primary care for COPD with bronchitis.  Patient received doxycycline and a prednisone taper at that time.  Patient had leftover prednisone from the January/2020 primary care office visit and he started taking 40 mg of prednisone daily for the past 2 days.  He reports that this did help his shortness of breath.  Patient denies known sick contacts but wife does work in Equities trader school and multiple kids have been sick around her.  Patient is maintained on Symbicort 160 and Spiriva 2.5.  He reports he is adherent to this.  He is also using DuoNeb nebulized medications at every 8 hours for the last 3 days.   MMRC - Breathlessness Score 2 - on level ground, I walk slower than people of the same age because of breathlessness, or have to stop for breathe when walking to my own pace     Tests:   FENO:  No results found for: NITRICOXIDE  PFT: PFT Results Latest Ref Rng & Units 06/07/2014  FVC-Pre L 2.80  FVC-Predicted Pre % 57  FVC-Post L 2.90  FVC-Predicted Post % 59  Pre FEV1/FVC % % 52  Post FEV1/FCV % % 51    FEV1-Pre L 1.46  FEV1-Predicted Pre % 40  FEV1-Post L 1.47  DLCO UNC% % 44  DLCO COR %Predicted % 55    Imaging: No results found.    Specialty Problems      Pulmonary Problems   Centrilobular Emphysema    Noted on 04/2014 CXR 06/09/2014 pulmonary function test> ratio 51%, FEV1 1.47 L (40% predicted), total lung capacity 5.43 L (71% predicted). DLCO 16.26 (44% predicted)      COPD with acute exacerbation (HCC)   Diffusion capacity of lung (dl), decreased      Allergies  Allergen Reactions  . Penicillins Rash    Has patient had a PCN reaction causing immediate rash, facial/tongue/throat swelling, SOB or lightheadedness with hypotension:Yes Has patient had a PCN reaction causing severe rash involving mucus membranes or skin necrosis:Yes Has patient had a PCN reaction that required hospitalization:No Has patient had a PCN reaction occurring within the last 10 years:No If all of the above answers are "NO", then may proceed with Cephalosporin use.     Immunization History  Administered Date(s) Administered  . Influenza Inj Mdck Quad Pf 10/28/2017  . Influenza Split 11/24/2013  . Influenza, High Dose Seasonal PF 11/05/2016  . Influenza,inj,Quad PF,6+ Mos 11/08/2014, 11/26/2015  . Influenza-Unspecified 10/11/2013, 10/11/2013  . Pneumococcal Conjugate-13 04/18/2014  . Pneumococcal Polysaccharide-23 11/26/2015  . Tdap 02/13/2011  . Zoster 11/07/2010  .  Zoster Recombinat (Shingrix) 12/01/2016, 02/20/2017   Up-to-date with vaccines  Past Medical History:  Diagnosis Date  . Allergy   . Calf pain   . COPD (chronic obstructive pulmonary disease) ([place])   . Dyspnea   . Hypokalemia   . Internal hemorrhoids   . Muscle cramp   . Muscle pain   . Pneumonia   . PONV (postoperative nausea and vomiting)   . Tubular adenoma of colon   . Ulcerative colitis ([place])     Tobacco History: Social History   Tobacco Use  Smoking Status Former Smoker  . Packs/day: 0.75  . Years:  35.00  . Pack years: 26.25  . Types: Cigarettes, Cigars  . Last attempt to quit: 06/11/2011  . Years since quitting: 6.7  Smokeless Tobacco Never Used  Tobacco Comment   smoking cessation materials not required   Counseling given: Yes Comment: smoking cessation materials not required  Continue to not smoke  Outpatient Encounter Medications as of 03/11/2018  Medication Sig  . albuterol (PROVENTIL HFA;VENTOLIN HFA) 108 (90 Base) MCG/ACT inhaler Inhale 2 puffs into the lungs every 6 (six) hours as needed for wheezing or shortness of breath.  Marland Kitchen albuterol (PROVENTIL) (2.5 MG/3ML) 0.083% nebulizer solution Take 3 mLs (2.5 mg total) by nebulization every 6 (six) hours as needed for wheezing or shortness of breath.  Marland Kitchen azithromycin (ZITHROMAX) 250 MG tablet 535m (two tablets) today, then 2541m(1 tablet) for the next 4 days  . budesonide-formoterol (SYMBICORT) 160-4.5 MCG/ACT inhaler Inhale 2 puffs into the lungs 2 (two) times daily.  . Marland Kitchenoxycycline (VIBRA-TABS) 100 MG tablet Take 1 tablet (100 mg total) by mouth 2 (two) times daily.  . Marland KitchenPINEPHrine 0.3 mg/0.3 mL IJ SOAJ injection Inject 0.3 mLs (0.3 mg total) into the muscle once. (Patient not taking: Reported on 03/11/2018)  . fluticasone (FLONASE) 50 MCG/ACT nasal spray Place 2 sprays into both nostrils daily.  . Marland KitchenuaiFENesin-codeine (ROBITUSSIN AC) 100-10 MG/5ML syrup Take 5 mLs by mouth 3 (three) times daily as needed for cough.  . Marland Kitchenpratropium-albuterol (DUONEB) 0.5-2.5 (3) MG/3ML SOLN Take 3 mLs by nebulization every 6 (six) hours as needed.  . loperamide (IMODIUM) 2 MG capsule Take 2 mg by mouth daily as needed for diarrhea or loose stools.   . meloxicam (MOBIC) 15 MG tablet Take 1 tablet (15 mg total) by mouth daily.  . mesalamine (LIALDA) 1.2 g EC tablet Take 4 tablets (4.8 g total) by mouth daily with breakfast.  . omeprazole (PRILOSEC) 20 MG capsule   . predniSONE (DELTASONE) 10 MG tablet Take 1 tablet (10 mg total) by mouth daily with  breakfast.  . predniSONE (DELTASONE) 10 MG tablet 4 tabs for 2 days, then 3 tabs for 2 days, 2 tabs for 2 days, then 1 tab for 2 days, then stop  . Respiratory Therapy Supplies (FLUTTER) DEVI Use as directed  . Tiotropium Bromide Monohydrate (SPIRIVA RESPIMAT) 2.5 MCG/ACT AERS Inhale 2 puffs into the lungs daily.  . [DISCONTINUED] budesonide-formoterol (SYMBICORT) 160-4.5 MCG/ACT inhaler Inhale 2 puffs into the lungs 2 (two) times daily.  . [DISCONTINUED] Tiotropium Bromide Monohydrate (SPIRIVA RESPIMAT) 2.5 MCG/ACT AERS Inhale 2 puffs into the lungs daily.   Facility-Administered Encounter Medications as of 03/11/2018  Medication  . albuterol (PROVENTIL) (2.5 MG/3ML) 0.083% nebulizer solution 2.5 mg     Review of Systems  Review of Systems  Constitutional: Negative for fatigue and fever.  HENT: Negative for congestion, sinus pressure and sinus pain.   Respiratory: Positive for cough, shortness of  breath and wheezing.   Cardiovascular: Negative for chest pain and palpitations.  Gastrointestinal: Negative for diarrhea, nausea and vomiting.     Physical Exam  BP (!) 130/54 (BP Location: Left Arm, Cuff Size: Normal)   Pulse (!) 50   Temp (!) 97.5 F (36.4 C) (Oral)   Ht 5' 11"  (1.803 m)   Wt 147 lb 9.6 oz (67 kg)   SpO2 95%   BMI 20.59 kg/m   Wt Readings from Last 5 Encounters:  03/11/18 147 lb 9.6 oz (67 kg)  02/12/18 149 lb (67.6 kg)  02/01/18 151 lb (68.5 kg)  01/10/18 150 lb (68 kg)  11/02/17 149 lb (67.6 kg)    Physical Exam  Constitutional: He is oriented to person, place, and time and well-developed, well-nourished, and in no distress. No distress.  HENT:  Head: Normocephalic and atraumatic.  Right Ear: Hearing, tympanic membrane, external ear and ear canal normal.  Left Ear: Hearing, tympanic membrane, external ear and ear canal normal.  Nose: Mucosal edema present. Right sinus exhibits no maxillary sinus tenderness and no frontal sinus tenderness. Left sinus  exhibits no maxillary sinus tenderness and no frontal sinus tenderness.  Mouth/Throat: Uvula is midline and oropharynx is clear and moist. No oropharyngeal exudate.  Eyes: Pupils are equal, round, and reactive to light.  Neck: Normal range of motion. Neck supple. No JVD present.  Cardiovascular: Normal rate, regular rhythm and normal heart sounds.  Pulmonary/Chest: Effort normal and breath sounds normal. No accessory muscle usage. No respiratory distress. He has no decreased breath sounds. He has no wheezes. He has no rhonchi.  + Bibasilar crackles  Abdominal: Soft. Bowel sounds are normal. There is no abdominal tenderness.  Musculoskeletal: Normal range of motion.        General: No edema.  Lymphadenopathy:    He has no cervical adenopathy.  Neurological: He is alert and oriented to person, place, and time. Gait normal.  Skin: Skin is warm and dry. He is not diaphoretic. No erythema.  Psychiatric: Mood, memory, affect and judgment normal.  Nursing note and vitals reviewed.   03/11/2018- walk in office-patient was able to complete 3 laps without any desaturations on room air  Lab Results:  CBC    Component Value Date/Time   WBC 10.0 05/28/2016 1450   RBC 5.01 05/28/2016 1450   HGB 15.3 05/28/2016 1450   HGB 15.2 11/26/2015 0933   HCT 45.6 05/28/2016 1450   HCT 43.9 11/26/2015 0933   PLT 277.0 05/28/2016 1450   PLT 247 11/26/2015 0933   MCV 91.1 05/28/2016 1450   MCV 90 11/26/2015 0933   MCV 95 10/01/2013 2131   MCH 31.0 04/28/2016 1433   MCHC 33.6 05/28/2016 1450   RDW 13.1 05/28/2016 1450   RDW 13.6 11/26/2015 0933   RDW 12.4 10/01/2013 2131   LYMPHSABS 2.1 05/28/2016 1450   MONOABS 1.4 (H) 05/28/2016 1450   EOSABS 0.7 05/28/2016 1450   BASOSABS 0.1 05/28/2016 1450    BMET    Component Value Date/Time   NA 144 12/01/2016 0925   NA 138 10/01/2013 2131   K 5.0 12/01/2016 0925   K 4.2 10/01/2013 2131   CL 103 12/01/2016 0925   CL 107 10/01/2013 2131   CO2 24  12/01/2016 0925   CO2 23 10/01/2013 2131   GLUCOSE 85 12/01/2016 0925   GLUCOSE 72 05/28/2016 1450   GLUCOSE 118 (H) 10/01/2013 2131   BUN 11 12/01/2016 0925   BUN 11 10/01/2013 2131  CREATININE 1.03 12/01/2016 0925   CREATININE 0.93 10/01/2013 2131   CALCIUM 9.5 12/01/2016 0925   CALCIUM 8.7 10/01/2013 2131   GFRNONAA 72 12/01/2016 0925   GFRNONAA >60 10/01/2013 2131   GFRAA 83 12/01/2016 0925   GFRAA >60 10/01/2013 2131    BNP No results found for: BNP  ProBNP No results found for: PROBNP    Assessment & Plan:   Centrilobular Emphysema Assessment: 2016 PFT shows severe obstruction with a DLCO of 44 No CT imaging to evaluate emphysema Former smoker Multiple exacerbations regarding COPD in the last 2 months Maintained on Symbicort 160 as well as Spiriva 2.5 Lungs clear to auscultation, with basilar crackles  Plan: Z-Pak today Prednisone taper today Can continue to use duo nebs every 8 hours as needed for shortness of breath and / or wheezing We will order pulmonary function testing to be completed prior to office visit with Dr. [place] Bells >>> May need to consider switching maintenance inhalers to nebulized medications after pulmonary function testing We will order high-resolution CT scan to evaluate for emphysema as well as basilar crackles on exam today Keep follow-up with Dr. [place] Bells in March/2020  Diffusion capacity of lung (dl), decreased Assessment: 2016 DLCO is 44 Bibasilar crackles on exam today  Plan: Walk in office today patient on room air did not desaturate We will order high-resolution CT We will order new pulmonary function testing Follow-up with Dr. [place] Bells March/2020     Lauraine Rinne, NP 03/11/2018   This appointment was 42 min with over 50% of the time in direct face-to-face patient care, assessment, plan of care, and follow-up.

## 2018-03-19 ENCOUNTER — Telehealth: Payer: Self-pay | Admitting: Pulmonary Disease

## 2018-03-19 NOTE — Telephone Encounter (Signed)
No, will not continue prednisone chronically. If symptoms worsen follow up with our office.   [name] FNP

## 2018-03-19 NOTE — Telephone Encounter (Signed)
Spoke with pt and wife on speaker phone.  Relayed Brian's recommendations.  Nothing further needed.

## 2018-03-19 NOTE — Telephone Encounter (Signed)
Called and spoke with Patient.  He stated that he is down to his last 2 35m Prednisone tabs, and he feels better taking it. He is wanting to know if he should stay on prednisone.   Last OV was 03/11/18, with BWyn Quaker NP Next OV is with BQ 04/20/18  BWyn Quaker NP, please advise       Instructions   Walk in office just to check to make sure oxygenation is stable with exertion  Azithromycin 2542mtablet  >>>Take 2 tablets (50013motal) today, and then 1 tablet (250m26mor the next four days  >>>take with food  >>>can also take probiotic and / or yogurt while on antibiotic   Prednisone 10mg45mlet  >>>4 tabs for 2 days, then 3 tabs for 2 days, 2 tabs for 2 days, then 1 tab for 2 days, then stop >>>take with food  >>>take in the morning   We will order high-resolution CT scan to be completed prior to March/2020 appointment with Dr. [place] Bellswill order pulmonary function testing to be completed prior to office visit with Dr. [place] Bellsarch/2020

## 2018-03-26 ENCOUNTER — Telehealth: Payer: Self-pay | Admitting: Pulmonary Disease

## 2018-03-26 NOTE — Telephone Encounter (Signed)
  Spoke with Tiffany from [employer]. She states the patient received three different locations for his CT scan. I advised her that according to his chart he is having his CT done at Imaging center in [facility]. She understood and will make pt aware. Nothing further is needed.   Appointment Information   Appt Date/Time/Location   04/16/2018  9:00 AM  at  [place]  PreCertification Number (if available)

## 2018-04-05 ENCOUNTER — Telehealth: Payer: Self-pay

## 2018-04-05 DIAGNOSIS — Z0000 Encounter for general adult medical examination without abnormal findings: Secondary | ICD-10-CM

## 2018-04-05 MED ORDER — OSELTAMIVIR PHOSPHATE 75 MG PO CAPS: 75.0000 mg | ORAL_CAPSULE | Freq: Every day | ORAL | 0 refills | Status: DC

## 2018-04-05 NOTE — Telephone Encounter (Signed)
Pt called stating that he was around his grandchild last Thursday and she was Dx with the flu. No symptoms as of yet, but he has bad lungs. Sent in Tamiflu qday x 10 days for preventative to [facility]

## 2018-04-16 ENCOUNTER — Ambulatory Visit
Admission: RE | Admit: 2018-04-16 | Discharge: 2018-04-16 | Disposition: A | Discharge status: Home / Self Care | Payer: PPO | Source: Ambulatory Visit | Attending: Pulmonary Disease | Admitting: Pulmonary Disease

## 2018-04-16 DIAGNOSIS — J449 Chronic obstructive pulmonary disease, unspecified: Secondary | ICD-10-CM | POA: Diagnosis not present

## 2018-04-16 DIAGNOSIS — J432 Centrilobular emphysema: Secondary | ICD-10-CM | POA: Diagnosis not present

## 2018-04-16 DIAGNOSIS — R942 Abnormal results of pulmonary function studies: Secondary | ICD-10-CM | POA: Diagnosis not present

## 2018-04-16 NOTE — Progress Notes (Signed)
We have received your CT results.  There was no finding for interstitial lung disease.  This is good news.  They do see mild to moderate centrilobular and paraseptal emphysema.  This is not shocking given your history of smoking.  No new changes.  Keep follow-up appointment with pulmonary function test as well as with Dr. [place] Bells this month.  The results can be gone over further with you at that time.  [name], FNP

## 2018-04-20 ENCOUNTER — Encounter: Payer: Self-pay | Admitting: Pulmonary Disease

## 2018-04-20 ENCOUNTER — Other Ambulatory Visit: Payer: Self-pay | Admitting: Pulmonary Disease

## 2018-04-20 ENCOUNTER — Ambulatory Visit (INDEPENDENT_AMBULATORY_CARE_PROVIDER_SITE_OTHER): Payer: PPO | Admitting: Pulmonary Disease

## 2018-04-20 ENCOUNTER — Ambulatory Visit: Payer: PPO | Admitting: Pulmonary Disease

## 2018-04-20 VITALS — BP 122/62 | HR 74 | Ht 71.0 in | Wt 151.4 lb

## 2018-04-20 DIAGNOSIS — R942 Abnormal results of pulmonary function studies: Secondary | ICD-10-CM

## 2018-04-20 DIAGNOSIS — J209 Acute bronchitis, unspecified: Secondary | ICD-10-CM

## 2018-04-20 DIAGNOSIS — J440 Chronic obstructive pulmonary disease with acute lower respiratory infection: Secondary | ICD-10-CM | POA: Diagnosis not present

## 2018-04-20 DIAGNOSIS — J449 Chronic obstructive pulmonary disease, unspecified: Secondary | ICD-10-CM

## 2018-04-20 LAB — PULMONARY FUNCTION TEST
DL/VA % pred: 68 %
DL/VA: 2.7 ml/min/mmHg/L
DLCO UNC % PRED: 55 %
DLCO UNC: 14.01 ml/min/mmHg
FEF 25-75 Post: 0.41 L/sec
FEF 25-75 Pre: 0.38 L/sec
FEF2575-%Change-Post: 7 %
FEF2575-%Pred-Post: 18 %
FEF2575-%Pred-Pre: 17 %
FEV1-%Change-Post: 5 %
FEV1-%PRED-PRE: 31 %
FEV1-%Pred-Post: 33 %
FEV1-POST: 1.02 L
FEV1-Pre: 0.97 L
FEV1FVC-%Change-Post: 3 %
FEV1FVC-%Pred-Pre: 52 %
FEV6-%Change-Post: 1 %
FEV6-%Pred-Post: 60 %
FEV6-%Pred-Pre: 59 %
FEV6-PRE: 2.36 L
FEV6-Post: 2.4 L
FEV6FVC-%CHANGE-POST: 0 %
FEV6FVC-%Pred-Post: 98 %
FEV6FVC-%Pred-Pre: 98 %
FVC-%Change-Post: 1 %
FVC-%Pred-Post: 61 %
FVC-%Pred-Pre: 60 %
FVC-Post: 2.6 L
FVC-Pre: 2.56 L
POST FEV1/FVC RATIO: 39 %
Post FEV6/FVC ratio: 92 %
Pre FEV1/FVC ratio: 38 %
Pre FEV6/FVC Ratio: 92 %
RV % pred: 198 %
RV: 5.06 L
TLC % pred: 119 %
TLC: 8.44 L

## 2018-04-20 MED ORDER — IPRATROPIUM-ALBUTEROL 0.5-2.5 (3) MG/3ML IN SOLN: 3.0000 mL | Freq: Four times a day (QID) | RESPIRATORY_TRACT | 0 refills | Status: DC | PRN

## 2018-04-20 NOTE — Patient Instructions (Signed)
Severe COPD with emphysema and worsening symptoms: Because of the response you had to prednisone I am concerned that she may have an allergy I am going to check blood work today called a serum IgE and a CBC with differential to look for evidence of an underlying allergy If those tests are suggestive of an underlying allergy then we will have you see an allergist Continue Symbicort Continue Spiriva Continue albuterol as needed for chest tightness wheezing or shortness of breath Practice good hand hygiene Stay physically active  If there is no evidence of an underlying allergy then I will talk to my colleagues at [facility] about bronchoscopic lung volume reduction.  We will see you back in 2 to 3 months or sooner if needed

## 2018-04-20 NOTE — Progress Notes (Signed)
Subjective:    Patient ID: Antonio [name], male    DOB: [birth year], 73 y.o.   MRN: [id number]  Synopsis: He was referred for shortness of breath in 2016. He had a lengthy smoking history but quit smoking in 2014. April 2016 pulmonary function testing confirmed COPD which was severe with an FEV1 of 1.47 L (40% predicted).   HPI Chief Complaint  Patient presents with  . Follow-up    f/u emphysema,    He says that he felt better on prednisone, but about 1-2 weeks later he felt worse.  He says that eh can get around, but he really can't do yard work on account of his dyspnea.  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  He has not had bronchitis or pneumonia since the last visit.  He continues to take Symbicort and Spiriva peer  Past Medical History:  Diagnosis Date  . Allergy   . Calf pain   . COPD (chronic obstructive pulmonary disease) ([place])   . Dyspnea   . Hypokalemia   . Internal hemorrhoids   . Muscle cramp   . Muscle pain   . Pneumonia   . PONV (postoperative nausea and vomiting)   . Tubular adenoma of colon   . Ulcerative colitis ([place])       Review of Systems  Constitutional: Negative for chills, fatigue and fever.  HENT: Negative for postnasal drip, rhinorrhea and sinus pain.   Respiratory: Positive for shortness of breath. Negative for cough and wheezing.   Cardiovascular: Negative for chest pain, palpitations and leg swelling.       Objective:   Physical Exam  Vitals:   04/20/18 1457  BP: 122/62  Pulse: 74  SpO2: 95%  Weight: 151 lb 6.4 oz (68.7 kg)  Height: 5' 11"  (1.803 m)   RA  Gen: well appearing HENT: OP clear, TM's clear, neck supple PULM: CTA B, normal percussion CV: RRR, no mgr, trace edema GI: BS+, soft, nontender Derm: no cyanosis or rash Psyche: normal mood and affect     CBC    Component Value Date/Time   WBC 10.0 05/28/2016 1450   RBC 5.01 05/28/2016 1450   HGB 15.3 05/28/2016 1450   HGB 15.2  11/26/2015 0933   HCT 45.6 05/28/2016 1450   HCT 43.9 11/26/2015 0933   PLT 277.0 05/28/2016 1450   PLT 247 11/26/2015 0933   MCV 91.1 05/28/2016 1450   MCV 90 11/26/2015 0933   MCV 95 10/01/2013 2131   MCH 31.0 04/28/2016 1433   MCHC 33.6 05/28/2016 1450   RDW 13.1 05/28/2016 1450   RDW 13.6 11/26/2015 0933   RDW 12.4 10/01/2013 2131   LYMPHSABS 2.1 05/28/2016 1450   MONOABS 1.4 (H) 05/28/2016 1450   EOSABS 0.7 05/28/2016 1450   BASOSABS 0.1 05/28/2016 1450   PFT December 2017 lung function testing showed ratio 50%, FEV1 1.47 L 43% predicted, FVC 2.96 L 67% predicted March 2020 pulmonary function testing ratio 38%, FEV1 0.97 L 31% predicted, total lung capacity 8.44 L 119% predicted, residual volume 198% predicted, DLCO 14.0 55% predicted  Chest imaging: May 2019 chest x-ray showed emphysema March 2020 high-resolution CT scan of the chest images independently reviewed: Centrilobular emphysema, no interstitial lung disease, aortic atherosclerosis     Assessment & Plan:   No diagnosis found.  Discussion I am concerned about him because his disease has progressed over the last few years and he does much better while on  prednisone.  This raises concern for the possibility of an underlying allergy.  Of note, in 2018 a CBC with differential showed elevated serum eosinophils.  I think we need to repeat this test and then consider an allergy evaluation.  If there is no evidence of an underlying allergy then we may consider lung volume reduction bronchoscopy.  Severe COPD with emphysema and worsening symptoms: Because of the response you had to prednisone I am concerned that she may have an allergy I am going to check blood work today called a serum IgE and a CBC with differential to look for evidence of an underlying allergy If those tests are suggestive of an underlying allergy then we will have you see an allergist Continue Symbicort Continue Spiriva Continue albuterol as needed  for chest tightness wheezing or shortness of breath Practice good hand hygiene Stay physically active  If there is no evidence of an underlying allergy then I will talk to my colleagues at [facility] about bronchoscopic lung volume reduction.  We will see you back in 2 to 3 months or sooner if needed  > 50% of this 25 min visit spent face to face   Current Outpatient Medications:  .  albuterol (PROVENTIL HFA;VENTOLIN HFA) 108 (90 Base) MCG/ACT inhaler, Inhale 2 puffs into the lungs every 6 (six) hours as needed for wheezing or shortness of breath., Disp: 3 Inhaler, Rfl: 3 .  albuterol (PROVENTIL) (2.5 MG/3ML) 0.083% nebulizer solution, Take 3 mLs (2.5 mg total) by nebulization every 6 (six) hours as needed for wheezing or shortness of breath., Disp: 75 mL, Rfl: 12 .  azithromycin (ZITHROMAX) 250 MG tablet, 57m (two tablets) today, then 2543m(1 tablet) for the next 4 days, Disp: 6 tablet, Rfl: 0 .  budesonide-formoterol (SYMBICORT) 160-4.5 MCG/ACT inhaler, Inhale 2 puffs into the lungs 2 (two) times daily., Disp: 3 Inhaler, Rfl: 3 .  EPINEPHrine 0.3 mg/0.3 mL IJ SOAJ injection, Inject 0.3 mLs (0.3 mg total) into the muscle once., Disp: 1 Device, Rfl: 1 .  guaiFENesin-codeine (ROBITUSSIN AC) 100-10 MG/5ML syrup, Take 5 mLs by mouth 3 (three) times daily as needed for cough., Disp: 100 mL, Rfl: 0 .  ipratropium-albuterol (DUONEB) 0.5-2.5 (3) MG/3ML SOLN, Take 3 mLs by nebulization every 6 (six) hours as needed., Disp: 360 mL, Rfl: 0 .  loperamide (IMODIUM) 2 MG capsule, Take 2 mg by mouth daily as needed for diarrhea or loose stools. , Disp: , Rfl:  .  mesalamine (LIALDA) 1.2 g EC tablet, Take 4 tablets (4.8 g total) by mouth daily with breakfast., Disp: 360 tablet, Rfl: 1 .  Respiratory Therapy Supplies (FLUTTER) DEVI, Use as directed, Disp: 1 each, Rfl: 0 .  Tiotropium Bromide Monohydrate (SPIRIVA RESPIMAT) 2.5 MCG/ACT AERS, Inhale 2 puffs into the lungs daily., Disp: 3 Inhaler, Rfl:  3  Current Facility-Administered Medications:  .  albuterol (PROVENTIL) (2.5 MG/3ML) 0.083% nebulizer solution 2.5 mg, 2.5 mg, Nebulization, Once, JoJuline PatchMD

## 2018-04-20 NOTE — Progress Notes (Signed)
Full PFT performed today. °

## 2018-04-21 ENCOUNTER — Ambulatory Visit: Payer: Self-pay

## 2018-04-21 LAB — CBC WITH DIFFERENTIAL/PLATELET
BASOS PCT: 1 % (ref 0.0–3.0)
Basophils Absolute: 0.1 10*3/uL (ref 0.0–0.1)
Eosinophils Absolute: 0.6 10*3/uL (ref 0.0–0.7)
Eosinophils Relative: 7.3 % — ABNORMAL HIGH (ref 0.0–5.0)
HCT: 44 % (ref 39.0–52.0)
Hemoglobin: 14.9 g/dL (ref 13.0–17.0)
Lymphocytes Relative: 23.6 % (ref 12.0–46.0)
Lymphs Abs: 2 10*3/uL (ref 0.7–4.0)
MCHC: 33.9 g/dL (ref 30.0–36.0)
MCV: 93.4 fl (ref 78.0–100.0)
Monocytes Absolute: 1.3 10*3/uL — ABNORMAL HIGH (ref 0.1–1.0)
Monocytes Relative: 15.2 % — ABNORMAL HIGH (ref 3.0–12.0)
Neutro Abs: 4.5 10*3/uL (ref 1.4–7.7)
Neutrophils Relative %: 52.9 % (ref 43.0–77.0)
Platelets: 218 10*3/uL (ref 150.0–400.0)
RBC: 4.71 Mil/uL (ref 4.22–5.81)
RDW: 12.6 % (ref 11.5–15.5)
WBC: 8.6 10*3/uL (ref 4.0–10.5)

## 2018-04-21 LAB — IGE: IgE (Immunoglobulin E), Serum: 67 kU/L (ref <=114)

## 2018-04-23 DIAGNOSIS — J449 Chronic obstructive pulmonary disease, unspecified: Secondary | ICD-10-CM

## 2018-04-23 MED ORDER — IPRATROPIUM-ALBUTEROL 0.5-2.5 (3) MG/3ML IN SOLN: RESPIRATORY_TRACT | 1 refills | Status: DC

## 2018-04-26 ENCOUNTER — Other Ambulatory Visit: Payer: Self-pay

## 2018-04-26 DIAGNOSIS — D721 Eosinophilia, unspecified: Secondary | ICD-10-CM

## 2018-04-28 ENCOUNTER — Other Ambulatory Visit: Payer: Self-pay | Admitting: Pulmonary Disease

## 2018-04-28 DIAGNOSIS — J449 Chronic obstructive pulmonary disease, unspecified: Secondary | ICD-10-CM

## 2018-05-07 ENCOUNTER — Ambulatory Visit (INDEPENDENT_AMBULATORY_CARE_PROVIDER_SITE_OTHER): Payer: Self-pay | Admitting: Vascular Surgery

## 2018-05-07 ENCOUNTER — Encounter (INDEPENDENT_AMBULATORY_CARE_PROVIDER_SITE_OTHER): Payer: Self-pay

## 2018-06-04 ENCOUNTER — Ambulatory Visit (INDEPENDENT_AMBULATORY_CARE_PROVIDER_SITE_OTHER): Payer: Self-pay | Admitting: Vascular Surgery

## 2018-06-04 ENCOUNTER — Encounter (INDEPENDENT_AMBULATORY_CARE_PROVIDER_SITE_OTHER): Payer: Self-pay

## 2018-06-21 ENCOUNTER — Encounter: Payer: Self-pay | Admitting: Pulmonary Disease

## 2018-06-21 ENCOUNTER — Other Ambulatory Visit: Payer: Self-pay

## 2018-06-21 ENCOUNTER — Ambulatory Visit (INDEPENDENT_AMBULATORY_CARE_PROVIDER_SITE_OTHER): Payer: PPO | Admitting: Pulmonary Disease

## 2018-06-21 DIAGNOSIS — D721 Eosinophilia, unspecified: Secondary | ICD-10-CM | POA: Insufficient documentation

## 2018-06-21 DIAGNOSIS — Z720 Tobacco use: Secondary | ICD-10-CM | POA: Diagnosis not present

## 2018-06-21 DIAGNOSIS — R942 Abnormal results of pulmonary function studies: Secondary | ICD-10-CM | POA: Diagnosis not present

## 2018-06-21 DIAGNOSIS — J449 Chronic obstructive pulmonary disease, unspecified: Secondary | ICD-10-CM

## 2018-06-21 NOTE — Assessment & Plan Note (Signed)
Assessment: March/2020 pulmonary function test shows a DLCO 58 March/2020 high-resolution CT shows centrilobular emphysema, no ILD  Plan: Follow-up with our office in 3 months

## 2018-06-21 NOTE — Patient Instructions (Addendum)
Continue Symbicort 160 >>> 2 puffs in the morning right when you wake up, rinse out your mouth after use, 12 hours later 2 puffs, rinse after use >>> Take this daily, no matter what >>> This is not a rescue inhaler   Continue Spiriva Respimat 2.5 >>> 2 puffs daily >>> Do this every day >>>This is not a rescue inhaler    Can use DuoNeb nebulized medications every to 8 hours as needed for shortness of breath and wheezing    Note your daily symptoms >remember "red flags" for COPD:  >>>Increase in cough >>>increase in sputum production >>>increase in shortness of breath or activity  intolerance.   If you notice these symptoms, please call the office to be seen.      Keep scheduled follow up with Allergist on June/11th at 945    Start nasal saline rinses after yard work  Attempt to wear a mask when doing yard work    Return in about 3 months (around 09/21/2018), or if symptoms worsen or fail to improve, for Follow up with Antonio [name], Follow up with Dr. Lake Bells.    Coronavirus (COVID-19) Are you at risk?  Are you at risk for the Coronavirus (COVID-19)?  To be considered HIGH RISK for Coronavirus (COVID-19), you have to meet the following criteria:  . Traveled to [place], [place], [place], [place] or [place]; or in the [place] to [place], [place], [place], or [place]; and have fever, cough, and shortness of breath within the last 2 weeks of travel OR . Been in close contact with a person diagnosed with COVID-19 within the last 2 weeks and have fever, cough, and shortness of breath . IF YOU DO NOT MEET THESE CRITERIA, YOU ARE CONSIDERED LOW RISK FOR COVID-19.  What to do if you are HIGH RISK for COVID-19?  Marland Kitchen If you are having a medical emergency, call 911. . Seek medical care right away. Before you go to a doctor's office, urgent care or emergency department, call ahead and tell them about your recent travel, contact with someone diagnosed with  COVID-19, and your symptoms. You should receive instructions from your physician's office regarding next steps of care.  . When you arrive at healthcare provider, tell the healthcare staff immediately you have returned from visiting [place], [place], [place], [place] or [place]; or traveled in the [place] to [place], [place], [place], or [place]; in the last two weeks or you have been in close contact with a person diagnosed with COVID-19 in the last 2 weeks.   . Tell the health care staff about your symptoms: fever, cough and shortness of breath. . After you have been seen by a medical provider, you will be either: o Tested for (COVID-19) and discharged home on quarantine except to seek medical care if symptoms worsen, and asked to  - Stay home and avoid contact with others until you get your results (4-5 days)  - Avoid travel on public transportation if possible (such as bus, train, or airplane) or o Sent to the Emergency Department by EMS for evaluation, COVID-19 testing, and possible admission depending on your condition and test results.  What to do if you are LOW RISK for COVID-19?  Reduce your risk of any infection by using the same precautions used for avoiding the common cold or flu:  Marland Kitchen Wash your hands often with soap and warm water for at least 20 seconds.  If soap and water are not readily  available, use an alcohol-based hand sanitizer with at least 60% alcohol.  . If coughing or sneezing, cover your mouth and nose by coughing or sneezing into the elbow areas of your shirt or coat, into a tissue or into your sleeve (not your hands). . Avoid shaking hands with others and consider head nods or verbal greetings only. . Avoid touching your eyes, nose, or mouth with unwashed hands.  . Avoid close contact with people who are sick. . Avoid places or events with large numbers of people in one location, like concerts or sporting events. . Carefully consider travel plans you have or  are making. . If you are planning any travel outside or inside the [place], visit the CDC's Travelers' Health webpage for the latest health notices. . If you have some symptoms but not all symptoms, continue to monitor at home and seek medical attention if your symptoms worsen. . If you are having a medical emergency, call 911.   [place] / e-Visit: [email or web address]         [facility]: [facility]: [phone]                   [facility]: [phone]           It is flu season:   >>> Best ways to protect herself from the flu: Receive the yearly flu vaccine, practice good hand hygiene washing with soap and also using hand sanitizer when available, eat a nutritious meals, get adequate rest, hydrate appropriately   Please contact the office if your symptoms worsen or you have concerns that you are not improving.   Thank you for choosing [facility] for your healthcare, and for allowing [place] to partner with you on your healthcare journey. I am thankful to be able to provide care to you today.   Antonio [name]

## 2018-06-21 NOTE — Assessment & Plan Note (Signed)
Plan: Continue to not smoke 

## 2018-06-21 NOTE — Assessment & Plan Note (Signed)
Assessment: March/2020 eosinophils absolute 0.6 March/2020 IgE 67  Plan: Referral to allergy Complete allergy appointment in June/2020

## 2018-06-21 NOTE — Progress Notes (Signed)
Virtual Visit via Telephone Note  I connected with  on 06/21/18 at  9:30 AM EDT by telephone and verified that I am speaking with the correct person using two identifiers.  Location: Patient: Home Provider: Office: [facility]    I discussed the limitations, risks, security and privacy concerns of performing an evaluation and management service by telephone and the availability of in person appointments. I also discussed with the patient that there may be a patient responsible charge related to this service. The patient expressed understanding and agreed to proceed.  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  26.25 pack years Maintenance: Symbicort 160, Spiriva 2.5 Pt of: Dr. 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  Patient was last seen by Dr. Lake Bells in March/2020.  At that time the patient had peripheral eosinophilia as well as a elevated IgE.  Patient was referred to an allergist.  Patient is planning on establishing with Dr. Donneta Romberg in [place] on 07/22/2018 at 945.  Patient is looking forward to that appointment.  Patient reports that his breathing has been stable since last office visit.  He continues to be maintained on Symbicort 160 as well as Spiriva Respimat 2.5.  Patient uses duo nebs about 2 times weekly.  Patient reports that he previously was on a antihistamine (generic cetirizine) he did not feel like this helped his allergies at all.  This was then stopped.  His primary care provider put him on montelukast 10 mg daily.  Patient does not believe that this is helping either with his allergies.  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  Patient is also not currently doing nasal saline rinses.  MMRC - Breathlessness Score 2 - on level ground, I walk slower than people of the same age because of breathlessness, or have to stop for breathe when walking to my own pace    Observations/Objective:  Noted on 04/2014 CXR  06/09/2014 pulmonary function test> ratio 51%, FEV1 1.47 L (40% predicted), total lung capacity 5.43 L (71% predicted). DLCO 16.26 (44% predicted) December 2017 lung function testing showed ratio 50%, FEV1 1.47 L 43% predicted, FVC 2.96 L 67% predicted March 2020 pulmonary function testing ratio 38%, FEV1 0.97 L 31% predicted, total lung capacity 8.44 L 119% predicted, residual volume 198% predicted, DLCO 14.0 55% predicted  March 2020 high-resolution CT scan of the chest images : Centrilobular emphysema, no interstitial lung disease, aortic atherosclerosis  04/20/2018-eosinophils relative 7.3, eosinophils absolute 0.6 04/20/2018-IgE-67  Assessment and Plan:  Centrilobular Emphysema 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, FEV1 0.97 (31% predicted), DLCO 55 March/2020 high-resolution CT shows centrilobular emphysema Former smoker, 26.25 pack years March/2020 blood work shows peripheral eosinophilia (eosinophils absolute 0.6), IgE 67 Maintained on Symbicort 160 and Spiriva Respimat 2.5 Uses duo nebs 2 times weekly Patient denies wheezing or changes in his respiratory status since last office visit  Plan: Continue Symbicort 160 Continue Spiriva Respimat 2.5 Continue duo nebs as needed Establish with allergist in June/2020 as planned Follow-up with our office in 2 to 3 months  Diffusion capacity of lung (  dl), decreased Assessment: March/2020 pulmonary function test shows a DLCO 22 March/2020 high-resolution CT shows centrilobular emphysema, no ILD  Plan: Follow-up with our office in 3  months  Tobacco abuse Plan: Continue to not smoke  Increased eosinophils in the blood Assessment: March/2020 eosinophils absolute 0.6 March/2020 IgE 67  Plan: Referral to allergy Complete allergy appointment in June/2020   Follow Up Instructions:  Return in about 3 months (around 09/21/2018), or if symptoms worsen or fail to improve, for Follow up with [name] FNP-C, Follow up with Dr. Lake Bells.   I discussed the assessment and treatment plan with the patient. The patient was provided an opportunity to ask questions and all were answered. The patient agreed with the plan and demonstrated an understanding of the instructions.   The patient was advised to call back or seek an in-person evaluation if the symptoms worsen or if the condition fails to improve as anticipated.  I provided 25 minutes of non-face-to-face time during this encounter.   Lauraine Rinne, NP

## 2018-06-21 NOTE — Assessment & Plan Note (Signed)
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, FEV1 0.97 (31% predicted), DLCO 20 March/2020 high-resolution CT shows centrilobular emphysema Former smoker, 26.25 pack years March/2020 blood work shows peripheral eosinophilia (eosinophils absolute 0.6), IgE 67 Maintained on Symbicort 160 and Spiriva Respimat 2.5 Uses duo nebs 2 times weekly Patient denies wheezing or changes in his respiratory status since last office visit  Plan: Continue Symbicort 160 Continue Spiriva Respimat 2.5 Continue duo nebs as needed Establish with allergist in June/2020 as planned Follow-up with our office in 2 to 3 months

## 2018-06-23 NOTE — Progress Notes (Signed)
Reviewed, agree 

## 2018-06-25 ENCOUNTER — Other Ambulatory Visit: Payer: Self-pay | Admitting: Pulmonary Disease

## 2018-07-01 DIAGNOSIS — J4550 Severe persistent asthma, uncomplicated: Secondary | ICD-10-CM | POA: Diagnosis not present

## 2018-07-01 DIAGNOSIS — J449 Chronic obstructive pulmonary disease, unspecified: Secondary | ICD-10-CM | POA: Diagnosis not present

## 2018-07-01 DIAGNOSIS — J309 Allergic rhinitis, unspecified: Secondary | ICD-10-CM | POA: Diagnosis not present

## 2018-07-06 ENCOUNTER — Ambulatory Visit: Payer: Self-pay | Admitting: Pulmonary Disease

## 2018-07-18 NOTE — Progress Notes (Signed)
@Patient  ID: Antonio [name], male    DOB: [birth year], 73 y.o.   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  26.25 pack years Maintenance: Symbicort 160, Spiriva 2.5 Pt of: Dr. Lake Bells  07/19/2018  - Visit   73 year old male former smoker followed in our office for centrilobular emphysema with an asthmatic component.  Patient has had elevated IgE as well as elevated peripheral eosinophilia on March/2020 blood work.  Patient was then referred to an allergist.  Patient has completed allergy appointment.  Patient reports that he completed allergy testing and no allergies were discovered.  Allergist referred patient back to pulmonary for further management.  Patient continues to be maintained on Symbicort 160 as well as Spiriva Respimat 2.5.  Patient denies any acute breathing issues at this time.  Patient reports he still does get short of breath when walking to the mailbox and back.  Unfortunately the patient is now in the donut hole he reports his inhalers are costing 100s of dollars to be out of pick them up from the pharmacy.  MMRC - Breathlessness Score 3 - I stop for breath after walking about 100 yards or after a few minutes on level ground (isle at grocery store is 162f)     Tests:   06/09/2014 pulmonary function test> ratio 51%, FEV1 1.47 L (40% predicted), total lung capacity 5.43 L (71% predicted). DLCO 16.26 (44% predicted) December 2017 lung function testing showed ratio 50%, FEV1 1.47 L 43% predicted, FVC 2.96 L 67% predicted  March 2020 pulmonary function testing ratio 38%, FEV1 0.97 L 31% predicted, total lung capacity 8.44 L 119% predicted, residual volume 198% predicted, DLCO 14.0 55%  predicted  March 2020 high-resolution CT scan of the chest images : Centrilobular emphysema, no interstitial lung disease, aortic atherosclerosis  04/20/2018-eosinophils relative 7.3, eosinophils absolute 0.6 04/20/2018-IgE-67  FENO:  No results found for: NITRICOXIDE  PFT: PFT Results Latest Ref Rng & Units 04/20/2018 06/07/2014  FVC-Pre L 2.56 2.80  FVC-Predicted Pre % 60 57  FVC-Post L 2.60 2.90  FVC-Predicted Post % 61 59  Pre FEV1/FVC % % 38 52  Post FEV1/FCV % % 39 51  FEV1-Pre L 0.97 1.46  FEV1-Predicted Pre % 31 40  FEV1-Post L 1.02 1.47  DLCO UNC% % 55 44  DLCO COR %Predicted % 68 55  TLC L 8.44 -  TLC % Predicted % 119 -  RV % Predicted % 198 -    Imaging: No results found.    Specialty Problems      Pulmonary Problems   Centrilobular Emphysema    Noted on 04/2014 CXR 06/09/2014 pulmonary function test> ratio 51%, FEV1 1.47 L (40% predicted), total lung capacity 5.43 L (71% predicted). DLCO 16.26 (44% predicted)      COPD with acute exacerbation (HCC)   Diffusion capacity of lung (dl), decreased      Allergies  Allergen Reactions   Penicillins Rash    Has patient had a PCN reaction causing immediate rash, facial/tongue/throat swelling, SOB or lightheadedness with hypotension:Yes Has patient had a PCN reaction causing severe rash involving mucus membranes or skin necrosis:Yes Has patient had a PCN reaction that required hospitalization:No Has patient had a PCN reaction occurring  within the last 10 years:No If all of the above answers are "NO", then may proceed with Cephalosporin use.     Immunization History  Administered Date(s) Administered   Influenza Inj Mdck Quad Pf 10/28/2017   Influenza Split 11/24/2013   Influenza, High Dose Seasonal PF 11/05/2016   Influenza,inj,Quad PF,6+ Mos 11/08/2014, 11/26/2015   Influenza-Unspecified 10/11/2013, 10/11/2013   Pneumococcal Conjugate-13 04/18/2014   Pneumococcal Polysaccharide-23 11/26/2015    Tdap 02/13/2011   Zoster 11/07/2010   Zoster Recombinat (Shingrix) 12/01/2016, 02/20/2017    Past Medical History:  Diagnosis Date   Allergy    Calf pain    COPD (chronic obstructive pulmonary disease) (HCC)    Dyspnea    Hypokalemia    Internal hemorrhoids    Muscle cramp    Muscle pain    Pneumonia    PONV (postoperative nausea and vomiting)    Tubular adenoma of colon    Ulcerative colitis ([place])     Tobacco History: Social History   Tobacco Use  Smoking Status Former Smoker   Packs/day: 0.75   Years: 35.00   Pack years: 26.25   Types: Cigarettes, Cigars   Last attempt to quit: 06/11/2011   Years since quitting: 7.1  Smokeless Tobacco Never Used  Tobacco Comment   smoking cessation materials not required   Counseling given: Yes Comment: smoking cessation materials not required   Continue to not smoke  Outpatient Encounter Medications as of 07/19/2018  Medication Sig   albuterol (PROVENTIL) (2.5 MG/3ML) 0.083% nebulizer solution Take 3 mLs (2.5 mg total) by nebulization every 6 (six) hours as needed for wheezing or shortness of breath.   albuterol (VENTOLIN HFA) 108 (90 Base) MCG/ACT inhaler INHALE 2 PUFFS INTO THE LUNGS EVERY 6 HOURS AS NEEDED FOR WHEEZING OR SHORTNESS OF BREATH   budesonide-formoterol (SYMBICORT) 160-4.5 MCG/ACT inhaler Inhale 2 puffs into the lungs 2 (two) times daily.   EPINEPHrine 0.3 mg/0.3 mL IJ SOAJ injection Inject 0.3 mLs (0.3 mg total) into the muscle once.   guaiFENesin-codeine (ROBITUSSIN AC) 100-10 MG/5ML syrup Take 5 mLs by mouth 3 (three) times daily as needed for cough.   ipratropium-albuterol (DUONEB) 0.5-2.5 (3) MG/3ML SOLN USE 3 ML VIA NEBULIZER EVERY 6 HOURS   loperamide (IMODIUM) 2 MG capsule Take 2 mg by mouth daily as needed for diarrhea or loose stools.    mesalamine (LIALDA) 1.2 g EC tablet Take 4 tablets (4.8 g total) by mouth daily with breakfast.   Respiratory Therapy Supplies (FLUTTER) DEVI  Use as directed   Tiotropium Bromide Monohydrate (SPIRIVA RESPIMAT) 2.5 MCG/ACT AERS Inhale 2 puffs into the lungs daily.   budesonide-formoterol (SYMBICORT) 160-4.5 MCG/ACT inhaler Inhale 2 puffs into the lungs every 12 (twelve) hours.   Tiotropium Bromide Monohydrate (SPIRIVA RESPIMAT) 2.5 MCG/ACT AERS Inhale 2 puffs into the lungs daily.   [DISCONTINUED] azithromycin (ZITHROMAX) 250 MG tablet 536m (two tablets) today, then 2515m(1 tablet) for the next 4 days (Patient not taking: Reported on 07/19/2018)   Facility-Administered Encounter Medications as of 07/19/2018  Medication   albuterol (PROVENTIL) (2.5 MG/3ML) 0.083% nebulizer solution 2.5 mg     Review of Systems  Review of Systems  Constitutional: Negative for activity change, chills, fatigue, fever and unexpected weight change.  HENT: Negative for congestion, postnasal drip, rhinorrhea, sinus pressure, sinus pain, sneezing and sore throat.   Eyes: Negative.   Respiratory: Positive for shortness of breath (With exertion). Negative for cough and wheezing.   Cardiovascular: Negative for chest pain and palpitations.  Gastrointestinal: Negative for diarrhea, nausea and vomiting.  Endocrine: Negative.   Musculoskeletal: Negative.   Skin: Negative.   Allergic/Immunologic: Negative for environmental allergies (Recent allergy work-up negative).  Neurological: Negative for dizziness and headaches.  Psychiatric/Behavioral: Negative.  Negative for dysphoric mood. The patient is not nervous/anxious.   All other systems reviewed and are negative.    Physical Exam  BP 116/72    Pulse 64    Ht 5' 11"  (1.803 m)    Wt 151 lb 6.4 oz (68.7 kg)    SpO2 95%    BMI 21.12 kg/m   Wt Readings from Last 5 Encounters:  07/19/18 151 lb 6.4 oz (68.7 kg)  04/20/18 151 lb 6.4 oz (68.7 kg)  03/11/18 147 lb 9.6 oz (67 kg)  02/12/18 149 lb (67.6 kg)  02/01/18 151 lb (68.5 kg)     Physical Exam  Constitutional: He is oriented to person, place,  and time and well-developed, well-nourished, and in no distress. No distress.  HENT:  Head: Normocephalic and atraumatic.  Right Ear: Hearing and external ear normal.  Left Ear: Hearing and external ear normal.  Mouth/Throat: Uvula is midline and oropharynx is clear and moist. No oropharyngeal exudate.  Postnasal drip  Eyes: Pupils are equal, round, and reactive to light.  Neck: Normal range of motion. Neck supple.  Cardiovascular: Normal rate, regular rhythm and normal heart sounds.  Pulmonary/Chest: Effort normal and breath sounds normal. No accessory muscle usage. No respiratory distress. He has no decreased breath sounds. He has no wheezes. He has no rhonchi. He has no rales.  Musculoskeletal: Normal range of motion.        General: No edema.  Lymphadenopathy:    He has no cervical adenopathy.  Neurological: He is alert and oriented to person, place, and time. Gait normal.  Skin: Skin is warm and dry. He is not diaphoretic. No erythema.  Psychiatric: Mood, memory, affect and judgment normal.  Nursing note and vitals reviewed.     Lab Results:  CBC    Component Value Date/Time   WBC 8.6 04/20/2018 1551   RBC 4.71 04/20/2018 1551   HGB 14.9 04/20/2018 1551   HGB 15.2 11/26/2015 0933   HCT 44.0 04/20/2018 1551   HCT 43.9 11/26/2015 0933   PLT 218.0 04/20/2018 1551   PLT 247 11/26/2015 0933   MCV 93.4 04/20/2018 1551   MCV 90 11/26/2015 0933   MCV 95 10/01/2013 2131   MCH 31.0 04/28/2016 1433   MCHC 33.9 04/20/2018 1551   RDW 12.6 04/20/2018 1551   RDW 13.6 11/26/2015 0933   RDW 12.4 10/01/2013 2131   LYMPHSABS 2.0 04/20/2018 1551   MONOABS 1.3 (H) 04/20/2018 1551   EOSABS 0.6 04/20/2018 1551   BASOSABS 0.1 04/20/2018 1551    BMET    Component Value Date/Time   NA 144 12/01/2016 0925   NA 138 10/01/2013 2131   K 5.0 12/01/2016 0925   K 4.2 10/01/2013 2131   CL 103 12/01/2016 0925   CL 107 10/01/2013 2131   CO2 24 12/01/2016 0925   CO2 23 10/01/2013 2131    GLUCOSE 85 12/01/2016 0925   GLUCOSE 72 05/28/2016 1450   GLUCOSE 118 (H) 10/01/2013 2131   BUN 11 12/01/2016 0925   BUN 11 10/01/2013 2131   CREATININE 1.03 12/01/2016 0925   CREATININE 0.93 10/01/2013 2131   CALCIUM 9.5 12/01/2016 0925   CALCIUM 8.7 10/01/2013 2131   GFRNONAA 72 12/01/2016 0925   GFRNONAA >60 10/01/2013 2131  GFRAA 83 12/01/2016 0925   GFRAA >60 10/01/2013 2131    BNP No results found for: BNP  ProBNP No results found for: PROBNP    Assessment & Plan:   Centrilobular Emphysema 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, FEV1 0.97 (31% predicted), DLCO 55 March/2020 high-resolution CT chest shows centrilobular emphysema Former smoker, 26.25 pack years March/2020 blood work shows peripheral eosinophilia (and eosinophils absolute 0.6), IgE 67 Maintained on Symbicort 160 and Spiriva Respimat 2.5 mMRC 3 today Breath sounds clear to auscultation  Plan: Continue Symbicort 160 Continue Spiriva Respimat 2.5 Continue duo nebs as needed Follow-up with our office in 3 months We will request records from allergist to see intradermal testing Could consider RAST blood allergy profile in the future Can consider biologic therapies in the future if patient starts to have exacerbations  Increased eosinophils in the blood Assessment: Patient is completed allergy work-up as instructed Per patient no allergies were found mMRC 3 today  Plan: Continue Symbicort 160, Spiriva Respimat 2.5 Can consider biologic therapies in the future patient starts to have exacerbations    Return in about 3 months (around 10/19/2018), or if symptoms worsen or fail to improve, for Follow up with [name] FNP-C, Follow up with Dr. Lake Bells.   Lauraine Rinne, NP 07/19/2018   This appointment was 28 minutes long with over 50% of the time in direct face-to-face patient care, assessment, plan of care, and follow-up.

## 2018-07-19 ENCOUNTER — Other Ambulatory Visit: Payer: Self-pay

## 2018-07-19 ENCOUNTER — Ambulatory Visit (INDEPENDENT_AMBULATORY_CARE_PROVIDER_SITE_OTHER): Payer: PPO | Admitting: Pulmonary Disease

## 2018-07-19 ENCOUNTER — Encounter: Payer: Self-pay | Admitting: Pulmonary Disease

## 2018-07-19 VITALS — BP 116/72 | HR 64 | Ht 71.0 in | Wt 151.4 lb

## 2018-07-19 DIAGNOSIS — D721 Eosinophilia, unspecified: Secondary | ICD-10-CM

## 2018-07-19 DIAGNOSIS — J449 Chronic obstructive pulmonary disease, unspecified: Secondary | ICD-10-CM | POA: Diagnosis not present

## 2018-07-19 MED ORDER — BUDESONIDE-FORMOTEROL FUMARATE 160-4.5 MCG/ACT IN AERO: 2.0000 | INHALATION_SPRAY | Freq: Two times a day (BID) | RESPIRATORY_TRACT | 0 refills | Status: DC

## 2018-07-19 MED ORDER — TIOTROPIUM BROMIDE MONOHYDRATE 2.5 MCG/ACT IN AERS: 2.0000 | INHALATION_SPRAY | Freq: Every day | RESPIRATORY_TRACT | 0 refills | Status: DC

## 2018-07-19 NOTE — Assessment & Plan Note (Signed)
Assessment: Patient is completed allergy work-up as instructed Per patient no allergies were found mMRC 3 today  Plan: Continue Symbicort 160, Spiriva Respimat 2.5 Can consider biologic therapies in the future patient starts to have exacerbations

## 2018-07-19 NOTE — Patient Instructions (Addendum)
[place]    Continue Symbicort160 >>> 2 puffs in the morning right when you wake up, rinse out your mouth after use, 12 hours later 2 puffs, rinse after use >>> Take this daily, no matter what >>> This is not a rescue inhaler  ContinueSpiriva Respimat 2.5 >>> 2 puffs daily >>> Do this every day >>>This is not a rescue inhaler  Referral to [facility] Case Management  >>>they will call you about medication costs   Can use DuoNeb nebulized medications every to 8 hours as needed for shortness of breath and wheezing    Note your daily symptoms >remember "red flags" for COPD:  >>>Increase in cough >>>increase in sputum production >>>increase in shortness of breath or activity intolerance.   If you notice these symptoms, please call the office to be seen.   Continue Zyrtec daily  Continue Flonase as needed for nasal congestion / allergy symptoms    Can consider biologic therapy such as for [facility] [place] in the future if you have recurrent flares to your breathing    Start nasal saline rinses after yard work  Attempt to wear a mask when doing yard work    Return in about 3 months (around 10/19/2018), or if symptoms worsen or fail to improve, for Follow up with [name] FNP-C, Follow up with Dr. Lake Bells.    Coronavirus (COVID-19) Are you at risk?  Are you at risk for the Coronavirus (COVID-19)?  To be considered HIGH RISK for Coronavirus (COVID-19), you have to meet the following criteria:  . Traveled to [place], [place], [place], [place] or [place]; or in the [place] to [place], [place], [place], or [place]; and have fever, cough, and shortness of breath within the last 2 weeks of travel OR . Been in close contact with a person diagnosed with COVID-19 within the last 2 weeks and have fever, cough, and shortness of breath . IF YOU DO NOT MEET THESE CRITERIA, YOU ARE CONSIDERED LOW RISK FOR COVID-19.  What to do if you are HIGH RISK for  COVID-19?  Marland Kitchen If you are having a medical emergency, call 911. . Seek medical care right away. Before you go to a doctor's office, urgent care or emergency department, call ahead and tell them about your recent travel, contact with someone diagnosed with COVID-19, and your symptoms. You should receive instructions from your physician's office regarding next steps of care.  . When you arrive at healthcare provider, tell the healthcare staff immediately you have returned from visiting [place], [place], [place], [place] or [place]; or traveled in the [place] to [place], [place], [place], or [place]; in the last two weeks or you have been in close contact with a person diagnosed with COVID-19 in the last 2 weeks.   . Tell the health care staff about your symptoms: fever, cough and shortness of breath. . After you have been seen by a medical provider, you will be either: o Tested for (COVID-19) and discharged home on quarantine except to seek medical care if symptoms worsen, and asked to  - Stay home and avoid contact with others until you get your results (4-5 days)  - Avoid travel on public transportation if possible (such as bus, train, or airplane) or o Sent to the Emergency Department by EMS for evaluation, COVID-19 testing, and possible admission depending on your condition and test results.  What to do if you are LOW RISK for COVID-19?  Reduce your risk of  any infection by using the same precautions used for avoiding the common cold or flu:  Marland Kitchen Wash your hands often with soap and warm water for at least 20 seconds.  If soap and water are not readily available, use an alcohol-based hand sanitizer with at least 60% alcohol.  . If coughing or sneezing, cover your mouth and nose by coughing or sneezing into the elbow areas of your shirt or coat, into a tissue or into your sleeve (not your hands). . Avoid shaking hands with others and consider head nods or verbal greetings only. . Avoid  touching your eyes, nose, or mouth with unwashed hands.  . Avoid close contact with people who are sick. . Avoid places or events with large numbers of people in one location, like concerts or sporting events. . Carefully consider travel plans you have or are making. . If you are planning any travel outside or inside the [place], visit the CDC's Travelers' Health webpage for the latest health notices. . If you have some symptoms but not all symptoms, continue to monitor at home and seek medical attention if your symptoms worsen. . If you are having a medical emergency, call 911.   [place] / e-Visit: [email or web address]         [facility]: [facility]: [phone]                   [facility]: [phone]           It is flu season:   >>> Best ways to protect herself from the flu: Receive the yearly flu vaccine, practice good hand hygiene washing with soap and also using hand sanitizer when available, eat a nutritious meals, get adequate rest, hydrate appropriately   Please contact the office if your symptoms worsen or you have concerns that you are not improving.   Thank you for choosing [facility] for your healthcare, and for allowing [place] to partner with you on your healthcare journey. I am thankful to be able to provide care to you today.   [name] FNP-C

## 2018-07-19 NOTE — Assessment & Plan Note (Signed)
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, FEV1 0.97 (31% predicted), DLCO 55 March/2020 high-resolution CT chest shows centrilobular emphysema Former smoker, 26.25 pack years March/2020 blood work shows peripheral eosinophilia (and eosinophils absolute 0.6), IgE 67 Maintained on Symbicort 160 and Spiriva Respimat 2.5 mMRC 3 today Breath sounds clear to auscultation  Plan: Continue Symbicort 160 Continue Spiriva Respimat 2.5 Continue duo nebs as needed Follow-up with our office in 3 months We will request records from allergist to see intradermal testing Could consider RAST blood allergy profile in the future Can consider biologic therapies in the future if patient starts to have exacerbations

## 2018-07-19 NOTE — Progress Notes (Signed)
Reviewed, agree 

## 2018-07-21 ENCOUNTER — Encounter: Payer: Self-pay | Admitting: *Deleted

## 2018-07-21 ENCOUNTER — Other Ambulatory Visit: Payer: Self-pay | Admitting: *Deleted

## 2018-07-21 NOTE — Patient Outreach (Signed)
Antonio [name] [facility]) Care Management  07/21/2018  Antonio [name] 1945/03/04 [id number]   Subjective: Telephone call to patient's home number, spoke with patient, and HIPAA verified.  Discussed [facility] Care Management  [employer] MD referral follow up, patient voiced understanding, and is in agreement to follow up.  Patient states he is doing well, is aware of MD referral, and in agreement to referral to [place] for medication assistance / medication management, he is currently in the donut hole.   States he will be out of town until 08/02/2018 on vacation, request follow up call from pharmacy after 08/02/2018,  and has enough medications to last for a few weeks. Patient states he is able to manage self care and has assistance as needed.  Patient voices understanding of medical diagnosis and treatment plan.  Patient states he does not have any education material, transition of care, care coordination, disease management, disease monitoring, transportation, or community resource needs at this time.  Patient advised [employer] has also partnered with [place], patient may be receiving information regarding additional disease management services that patient may be eligible for in the near future.   States he is very appreciative of the follow up and is in agreement to receive [place] Management services.  Case discussed with Bary Castilla Assistant Clinical Director at [facility].   Sent the following secure/ internal email update to Bary Castilla and Karrie Meres at [place] Management: The below patient has been referred to [place] for medication assistance / medication management, he is currently in the donut hole.   States he will be out of town until 08/02/2018 on vacation, request follow up call from pharmacy after 08/02/2018,  and has enough medications to last for a few weeks.  Antonio [name] Male, 73 y.o., 1945/06/07 MRN:  [id number]  Thanks,  Rexanne Inocencio     Objective: Per KPN (Knowledge Performance Now, point of care tool) and chart review, patient has had no recent hospitalizations or ED visit.  Patient also has a history of COPD,centrilobular emphysema with an asthmatic component, Tubular adenoma of colon, and Ulcerative colitis.       Assessment: Received [employer] MD follow up referral on 07/19/2018.  Referral reason: Medication management in donut hole with COPD and pneumonia.  Screening follow up completed and will refer patient to [place] for medication management and assistance.     Plan: RNCM will refer patient to [place] for medication management and assistance.     Leslie Langille H. Annia Friendly, BSN, [place] Management [facility] Telephonic CM Phone: [phone] Fax: [phone]

## 2018-07-23 ENCOUNTER — Other Ambulatory Visit: Payer: Self-pay | Admitting: Pharmacist

## 2018-07-23 NOTE — Patient Outreach (Signed)
[place] [facility]) Care Management  [name]  07/23/2018  Antonio [name] [birth year] [id number]   Reason for referral: Medication Assistance with Symbicort, Spiriva Respimat  Referral source: Dr. [name] Current insurance: [employer]  Noted [name] RN spoke with patient on 07/21/2018 to discuss [place] medication assistance referral.  Patient requested that he be contacted after 08/02/2018 as he will be out of town for several days.    Plan:  -Will call patient on 08/02/2018 to review medications and possible patient assistance program applications for his inhalers.   [name], PharmD, [place] [phone]

## 2018-07-29 ENCOUNTER — Encounter: Payer: Self-pay | Admitting: Internal Medicine

## 2018-07-30 ENCOUNTER — Encounter: Payer: Self-pay | Admitting: Internal Medicine

## 2018-08-02 ENCOUNTER — Ambulatory Visit: Payer: Self-pay | Admitting: Pharmacist

## 2018-08-03 ENCOUNTER — Ambulatory Visit: Payer: Self-pay | Admitting: Pharmacist

## 2018-08-03 ENCOUNTER — Other Ambulatory Visit: Payer: Self-pay | Admitting: Pharmacist

## 2018-08-03 NOTE — Patient Outreach (Signed)
[place] [facility]) Care Management  [place]   08/03/2018  Antonio [name] 1945-09-11 [id number]  Reason for referral: Medication Assistance with inhalers- currently on Symbicort and Spiriva Respimat, PRN duonebs, now in coverage gap  Referral source: Dr. [name] Current insurance: [employer]  PMHx includes but not limited to:  Severe COPD with emphysema, former smoker, ulcerative colitis, tubular adenoma of colon  Outreach:  Unsuccessful telephone call attempt #1 to patient. HIPAA compliant voicemail left requesting a return call  Plan:  -I will mail patient an unsuccessful outreach letter.  -I will make another outreach attempt to patient within 3-4 business days.    [name], PharmD, [place] [phone]

## 2018-08-04 ENCOUNTER — Telehealth: Payer: Self-pay | Admitting: Pulmonary Disease

## 2018-08-04 ENCOUNTER — Other Ambulatory Visit: Payer: Self-pay | Admitting: Pharmacy Technician

## 2018-08-04 ENCOUNTER — Ambulatory Visit: Payer: Self-pay | Admitting: Pharmacist

## 2018-08-04 ENCOUNTER — Other Ambulatory Visit: Payer: Self-pay | Admitting: Pharmacist

## 2018-08-04 NOTE — Telephone Encounter (Signed)
08/04/2018 0950  Lauren please see the message below.  Please be on the look out for paperwork from [facility] regarding patient starting [facility] as well as Symbicort.  Can also add Proventil.  [name] FNP

## 2018-08-04 NOTE — Patient Outreach (Signed)
[place] [facility]) Care Management  08/04/2018  Antonio [name] 04-20-1945 [id number]                          Medication Assistance Referral  Referral From: [place]  Medication/Company: Ruthe Mannan and Proventil HFA / [employer]  Patient application portion:  Education officer, museum portion: [employer] to B. Warner Mccreedy, NP  Medication/Company: Stann Ore / B-I Patient application portion:  Mailed Provider application portion: Interoffice Mailed to B. Warner Mccreedy, NP  Medication/Company: Zoila Shutter Patient application portion:  Mailed Provider application portion: Faxed  to Dr. Lenna Sciara. Pyrtle   Follow up:  Will follow up with patient in 7-10 business days to confirm application(s) have been received.  [name] [name] [place] Certified Pharmacy Technician [place] Management Direct [phone]

## 2018-08-04 NOTE — Patient Outreach (Addendum)
[place] [facility]) Care Management  [place] [address]   08/04/2018  Antonio [name] Jul 26, 1945 [id number]  Reason for referral: Medication Assistance with Symbicort, Stann Ore, Lialda  Referral source: Dr. [name] Current insurance: [employer]  PMHx includes but not limited to:  Severe COPD with emphysema, former smoker, ulcerative colitis, tubular adenoma of colon  Outreach:  Incoming call and voicemail received from patient.  Successful return  telephone call with Antonio [name].  HIPAA identifiers verified.   Subjective:  Patient reports he is in the coverage gap and co-pays for inhalers and Lialda have greatly increased.  He has used samples of both inhalers and Lialda in the past from providers.  He currently has supply of all medications at home.     Objective: The 10-year ASCVD risk score Mikey Bussing DC Jr., et al., 2013) is: 19.5%   Values used to calculate the score:     Age: 73 years     Sex: Male     Is Non-Hispanic African American: No     Diabetic: No     Tobacco smoker: No     Systolic Blood Pressure: 354 mmHg     Is BP treated: No     HDL Cholesterol: 50 mg/dL     Total Cholesterol: 151 mg/dL  Lab Results  Component Value Date   CREATININE 1.03 12/01/2016   CREATININE 0.95 05/28/2016   CREATININE 0.97 04/28/2016    No results found for: HGBA1C  Lipid Panel     Component Value Date/Time   CHOL 151 12/01/2016 0925   TRIG 51 12/01/2016 0925   HDL 50 12/01/2016 0925   CHOLHDL 3.0 12/01/2016 0925   LDLCALC 91 12/01/2016 0925    BP Readings from Last 3 Encounters:  07/19/18 116/72  04/20/18 122/62  03/11/18 (!) 130/54    Allergies  Allergen Reactions  . Penicillins Rash    Has patient had a PCN reaction causing immediate rash, facial/tongue/throat swelling, SOB or lightheadedness with hypotension:Yes Has patient had a PCN reaction causing severe rash involving mucus membranes or skin necrosis:Yes Has patient had a PCN reaction  that required hospitalization:No Has patient had a PCN reaction occurring within the last 10 years:No If all of the above answers are "NO", then may proceed with Cephalosporin use.     Medications Reviewed Today    Reviewed by Lauraine Rinne, NP (Nurse Practitioner) on 07/19/18 at [phone]  Med List Status: <None>  Medication Order Taking? Sig Documenting Provider Last Dose Status Informant  albuterol (PROVENTIL) (2.5 MG/3ML) 0.083% nebulizer solution [id number] Yes Take 3 mLs (2.5 mg total) by nebulization every 6 (six) hours as needed for wheezing or shortness of breath. [name], MD Taking Active   albuterol (PROVENTIL) (2.5 MG/3ML) 0.083% nebulizer solution 2.5 mg [id number]   [name], MD  Active   albuterol (VENTOLIN HFA) 108 (90 Base) MCG/ACT inhaler [id number] Yes INHALE 2 PUFFS INTO THE LUNGS EVERY 6 HOURS AS NEEDED FOR WHEEZING OR SHORTNESS OF Frazier Richards, MD Taking Active   budesonide-formoterol [facility]) 160-4.5 MCG/ACT inhaler [id number] Yes Inhale 2 puffs into the lungs 2 (two) times daily. Lauraine Rinne, NP Taking Active   EPINEPHrine 0.3 mg/0.3 mL IJ SOAJ injection [id number] Yes Inject 0.3 mLs (0.3 mg total) into the muscle once. [name], MD Taking Active Self  guaiFENesin-codeine [facility]) 100-10 MG/5ML syrup [id number] Yes Take 5 mLs by mouth 3 (three) times daily as needed for cough. [name],  MD Taking Active   ipratropium-albuterol (DUONEB) 0.5-2.5 (3) MG/3ML SOLN [id number] Yes USE 3 ML VIA NEBULIZER EVERY 6 HOURS McQuaid, Ronie Spies, MD Taking Active   loperamide (IMODIUM) 2 MG capsule [id number] Yes Take 2 mg by mouth daily as needed for diarrhea or loose stools.  [provider] Taking Active Self  mesalamine (LIALDA) 1.2 g EC tablet [id number] Yes Take 4 tablets (4.8 g total) by mouth daily with breakfast. Pyrtle, Lajuan Lines, MD Taking Active   Respiratory Therapy Supplies (FLUTTER) DEVI [id number] Yes Use as directed Juanito Doom, MD Taking Active Self  Tiotropium Bromide Monohydrate (SPIRIVA RESPIMAT) 2.5 MCG/ACT AERS [id number] Yes Inhale 2 puffs into the lungs daily. Lauraine Rinne, NP Taking Active           Assessment: Drugs sorted by system:  Pulmonary/Allergy: albuterol nebulizer, ipratropium-albuterol nebulizer, albuterol inhaler, budesonide-formoterol inhaler, tiotropium bromide inhaler, epinephrine, guaifenesin-codeine PRN   Gastrointestinal: mesalamine, loperamide   Medication Assistance Findings:  Medication assistance needs identified: Lialda, Symbicort, Spiriva  Extra Help:  Not eligible for Extra Help Low Income Subsidy based on reported income and assets  Patient Assistance Programs: Symbicort made by [employer] o Income requirement met: No o Out-of-pocket prescription expenditure met:   Unknown - Patient has not met application requirements to apply for this patient assistance program at this time.  - May consider changing to [facility] which patient does qualify for with income, can also add in albuterol rescue inhaler Proventil as this is also active medication for patient made by [employer]  Spiriva made by [place] requirement met: No, however may appeal as co-pays higher now that patient in the coverage gap o Out-of-pocket prescription expenditure met:   Not Applicable - Reviewed program requirements with patient.   - May be eligible, Patient agreeable to apply   [place] made by [place] requirement met: Yes o Out-of-pocket prescription expenditure met:   Not Applicable - Patient has met application requirements to apply for this patient assistance program.      Additional medication assistance options reviewed with patient as warranted:  -Good RX  -RX Outreach ([place] pricing is the same as current 90 day supply via insurance)   Plan: . I will route patient assistance letter to [place] technician who will coordinate patient assistance program  application process for medications listed above.  [facility] pharmacy technician will assist with obtaining all required documents from both patient and provider(s) and submit application(s) once completed.   . Will route note to Dr. Warner Mccreedy to provide update  [name], PharmD, [place] [phone]

## 2018-08-04 NOTE — Telephone Encounter (Signed)
-----   Message from Rudean Haskell, [facility] sent at 08/04/2018  9:49 AM EDT ----- Antonio [name],  I was able to speak with Antonio [name] this morning regarding medication assistance.  We can apply for Spiriva and if ok with you, substitute Symbicort to [facility] as patient is over income for Symbicort patient assistance program.  Can also add in Proventil with [facility] application as this also made by [employer].     Thanks, [employer]

## 2018-08-05 ENCOUNTER — Ambulatory Visit: Payer: Self-pay | Admitting: Pharmacist

## 2018-08-09 ENCOUNTER — Ambulatory Visit: Payer: PPO | Admitting: *Deleted

## 2018-08-09 ENCOUNTER — Other Ambulatory Visit: Payer: Self-pay

## 2018-08-09 VITALS — Ht 71.0 in | Wt 150.0 lb

## 2018-08-09 DIAGNOSIS — Z86010 Personal history of colonic polyps: Secondary | ICD-10-CM

## 2018-08-09 DIAGNOSIS — K5190 Ulcerative colitis, unspecified, without complications: Secondary | ICD-10-CM

## 2018-08-09 MED ORDER — NA SULFATE-K SULFATE-MG SULF 17.5-3.13-1.6 GM/177ML PO SOLN: ORAL | 0 refills | Status: DC

## 2018-08-09 NOTE — Progress Notes (Signed)
Patient's pre-visit was done today over the phone with the patient due to COVID-19 pandemic. Name,DOB and address verified. Insurance verified. Packet of Prep instructions mailed to patient including copy of a consent form and pre-procedure patient acknowledgement form-pt is aware. Patient understands to call us back with any questions or concerns. Patient denies any allergies to eggs or soy. Patient denies any problems with anesthesia/sedation. Patient denies any oxygen use at home. Patient denies taking any diet/weight loss medications or blood thinners. EMMI education assisgned to patient on colonoscopy, this was explained and instructions given to patient. Pt is aware that care partner will wait in the car in parking lot; if they feel like they will be too hot to wait in the car; they may wait in the lobby.  We want them to wear a mask (we do not have any that we can provide them), practice social distancing, and we will check their temperatures when they get here.  I did remind patient that their care partner needs to stay in the parking lot the entire time. Pt will wear mask into building

## 2018-08-10 MED ORDER — ALBUTEROL SULFATE HFA 108 (90 BASE) MCG/ACT IN AERS: 2.0000 | INHALATION_SPRAY | Freq: Four times a day (QID) | RESPIRATORY_TRACT | 11 refills | Status: DC | PRN

## 2018-08-10 MED ORDER — MOMETASONE FURO-FORMOTEROL FUM 200-5 MCG/ACT IN AERO: 2.0000 | INHALATION_SPRAY | Freq: Two times a day (BID) | RESPIRATORY_TRACT | 11 refills | Status: DC

## 2018-08-10 MED ORDER — SPIRIVA RESPIMAT 2.5 MCG/ACT IN AERS: 2.0000 | INHALATION_SPRAY | Freq: Every day | RESPIRATORY_TRACT | 11 refills | Status: DC

## 2018-08-10 NOTE — Telephone Encounter (Signed)
08/10/2018 0 939  We have received the paperwork from [facility].  All forms have been signed.  Routed back to [facility].  [name], FNP

## 2018-08-10 NOTE — Telephone Encounter (Signed)
We received paper work from [facility]. Antonio [name] has signed and put in appropriate information with Rx printed. Put back into mail sent to [facility].  Nothing further needed at this time

## 2018-08-12 ENCOUNTER — Other Ambulatory Visit (INDEPENDENT_AMBULATORY_CARE_PROVIDER_SITE_OTHER): Payer: Self-pay | Admitting: Vascular Surgery

## 2018-08-12 DIAGNOSIS — I6523 Occlusion and stenosis of bilateral carotid arteries: Secondary | ICD-10-CM

## 2018-08-12 DIAGNOSIS — I70219 Atherosclerosis of native arteries of extremities with intermittent claudication, unspecified extremity: Secondary | ICD-10-CM

## 2018-08-17 ENCOUNTER — Ambulatory Visit (INDEPENDENT_AMBULATORY_CARE_PROVIDER_SITE_OTHER): Payer: PPO

## 2018-08-17 ENCOUNTER — Ambulatory Visit (INDEPENDENT_AMBULATORY_CARE_PROVIDER_SITE_OTHER): Payer: PPO | Admitting: Vascular Surgery

## 2018-08-17 ENCOUNTER — Other Ambulatory Visit: Payer: Self-pay

## 2018-08-17 ENCOUNTER — Encounter (INDEPENDENT_AMBULATORY_CARE_PROVIDER_SITE_OTHER): Payer: Self-pay | Admitting: Vascular Surgery

## 2018-08-17 VITALS — BP 151/54 | HR 67 | Resp 10 | Ht 71.0 in | Wt 154.0 lb

## 2018-08-17 DIAGNOSIS — J449 Chronic obstructive pulmonary disease, unspecified: Secondary | ICD-10-CM | POA: Diagnosis not present

## 2018-08-17 DIAGNOSIS — I6523 Occlusion and stenosis of bilateral carotid arteries: Secondary | ICD-10-CM

## 2018-08-17 DIAGNOSIS — Z87891 Personal history of nicotine dependence: Secondary | ICD-10-CM | POA: Diagnosis not present

## 2018-08-17 DIAGNOSIS — Z7982 Long term (current) use of aspirin: Secondary | ICD-10-CM | POA: Diagnosis not present

## 2018-08-17 DIAGNOSIS — I70212 Atherosclerosis of native arteries of extremities with intermittent claudication, left leg: Secondary | ICD-10-CM | POA: Diagnosis not present

## 2018-08-17 DIAGNOSIS — M4802 Spinal stenosis, cervical region: Secondary | ICD-10-CM | POA: Diagnosis not present

## 2018-08-17 DIAGNOSIS — I70219 Atherosclerosis of native arteries of extremities with intermittent claudication, unspecified extremity: Secondary | ICD-10-CM

## 2018-08-17 NOTE — Progress Notes (Signed)
MRN : [id number]  Antonio [name] is a 73 y.o. (27-Feb-1945) male who presents with chief complaint of  Chief Complaint  Patient presents with  . Follow-up  .  History of Present Illness: Patient returns today in follow up of multiple vascular issues.  He is doing well today.  He denies any major problems.  His claudication symptoms at current are essentially nonexistent.  He denies any focal neurologic symptoms.  Carotid duplex today reveals mild, 1 to 39% ICA stenosis bilaterally without significant progression from his previous studies.  ABIs today are stable and normal on the right at 1.10 with triphasic waveforms.  His left ABI is actually about 10 points higher today and is at 0.56 although his digital index is 0.54.  Current Outpatient Medications  Medication Sig Dispense Refill  . albuterol (PROVENTIL) (2.5 MG/3ML) 0.083% nebulizer solution Take 3 mLs (2.5 mg total) by nebulization every 6 (six) hours as needed for wheezing or shortness of breath. 75 mL 12  . albuterol (VENTOLIN HFA) 108 (90 Base) MCG/ACT inhaler INHALE 2 PUFFS INTO THE LUNGS EVERY 6 HOURS AS NEEDED FOR WHEEZING OR SHORTNESS OF BREATH 25.5 g 1  . Ascorbic Acid (VITAMIN C PO) Take by mouth.    . budesonide-formoterol (SYMBICORT) 160-4.5 MCG/ACT inhaler Inhale 2 puffs into the lungs every 12 (twelve) hours. 2 Inhaler 0  . cetirizine (ZYRTEC) 10 MG tablet Take 10 mg by mouth daily.    Marland Kitchen ipratropium-albuterol (DUONEB) 0.5-2.5 (3) MG/3ML SOLN USE 3 ML VIA NEBULIZER EVERY 6 HOURS (Patient taking differently: 3 mLs every 6 (six) hours as needed. USE 3 ML VIA NEBULIZER EVERY 6 HOURS) 75 mL 2  . loperamide (IMODIUM) 2 MG capsule Take 2 mg by mouth daily as needed for diarrhea or loose stools.     Marland Kitchen MAGNESIUM PO Take by mouth.    . mesalamine (LIALDA) 1.2 g EC tablet Take 4 tablets (4.8 g total) by mouth daily with breakfast. 360 tablet 1  . mometasone-formoterol (DULERA) 200-5 MCG/ACT AERO Inhale 2 puffs into the lungs 2  (two) times daily. 8.8 g 11  . montelukast (SINGULAIR) 10 MG tablet TK 1 T PO QD    . Multiple Vitamins-Minerals (ZINC PO) Take by mouth.    . Na Sulfate-K Sulfate-Mg Sulf 17.5-3.13-1.6 GM/177ML SOLN Suprep (no substitutions)-TAKE AS DIRECTED. 354 mL 0  . Tiotropium Bromide Monohydrate (SPIRIVA RESPIMAT) 2.5 MCG/ACT AERS Inhale 2 puffs into the lungs daily. 3 Inhaler 3  . VITAMIN D PO Take by mouth.    Marland Kitchen albuterol (VENTOLIN HFA) 108 (90 Base) MCG/ACT inhaler Inhale 2 puffs into the lungs every 6 (six) hours as needed for wheezing or shortness of breath. 8 g 11  . EPINEPHrine 0.3 mg/0.3 mL IJ SOAJ injection Inject 0.3 mLs (0.3 mg total) into the muscle once. (Patient not taking: Reported on 08/09/2018) 1 Device 1  . guaiFENesin-codeine (ROBITUSSIN AC) 100-10 MG/5ML syrup Take 5 mLs by mouth 3 (three) times daily as needed for cough. (Patient not taking: Reported on 08/17/2018) 100 mL 0  . meloxicam (MOBIC) 15 MG tablet Take 15 mg by mouth daily.    . Tiotropium Bromide Monohydrate (SPIRIVA RESPIMAT) 2.5 MCG/ACT AERS Inhale 2 puffs into the lungs daily. 4 g 11   No current facility-administered medications for this visit.     Past Medical History:  Diagnosis Date  . Allergy   . Calf pain   . COPD (chronic obstructive pulmonary disease) (HCC)    no Oxygen per  pt  . Dyspnea   . Hypokalemia   . Internal hemorrhoids   . Muscle cramp   . Muscle pain   . Pneumonia   . PONV (postoperative nausea and vomiting)   . Tubular adenoma of colon   . Ulcerative colitis [facility])     Past Surgical History:  Procedure Laterality Date  . ANTERIOR CERVICAL DECOMP/DISCECTOMY FUSION N/A 05/05/2016   Procedure: ANTERIOR CERVICAL DECOMPRESSION FUSION CERVICAL THREE-FOUR.;  Surgeon: Earnie Larsson, MD;  Location: [place];  Service: Neurosurgery;  Laterality: N/A;  right side approach  . CERVICAL DISCECTOMY  04/2016   C3-C4  . COLONOSCOPY    . COLONOSCOPY  08/09/2015  . [place]  . leg stent Left  approx 5-6 yrs ago   2 to left leg  . MENISCUS REPAIR Left 12/2013  . POLYPECTOMY    . ROTATOR CUFF REPAIR Left 2007  . TONSILLECTOMY AND ADENOIDECTOMY  1957   Social History  Substance Use Topics  . Smoking status: Former Smoker    Packs/day: 0.75    Years: 35.00    Types: Cigarettes, Cigars    Quit date: 06/11/2011  . Smokeless tobacco: Never Used     Comment: off and on smoker   . Alcohol use 4.2 oz/week     7 Standard drinks or equivalent per week      Comment: 1-2 day    Family History       Family History  Problem Relation Age of Onset  . Cancer Mother     leukemia  . Heart disease Father   . Heart attack Father   . Healthy Daughter   . Healthy Son   . Colon cancer Neg Hx          Allergies  Allergen Reactions  . Penicillins Rash    Has patient had a PCN reaction causing immediate rash, facial/tongue/throat swelling, SOB or lightheadedness with hypotension:Yes Has patient had a PCN reaction causing severe rash involving mucus membranes or skin necrosis:Yes Has patient had a PCN reaction that required hospitalization:No Has patient had a PCN reaction occurring within the last 10 years:No If all of the above answers are "NO", then may proceed with Cephalosporin use.      REVIEW OF SYSTEMS(Negative unless checked)  Constitutional: [] ?Weight loss[] ?Fever[] ?Chills Cardiac:[] ?Chest pain[] ?Chest pressure[] ?Palpitations [] ?Shortness of breath when laying flat [] ?Shortness of breath at rest [] ?Shortness of breath with exertion. Vascular: [x] ?Pain in legs with walking[] ?Pain in legsat rest[] ?Pain in legs when laying flat [x] ?Claudication [] ?Pain in feet when walking [] ?Pain in feet at rest [] ?Pain in feet when laying flat [] ?History of DVT [] ?Phlebitis [] ?Swelling in legs [] ?Varicose veins [] ?Non-healing ulcers Pulmonary: [] ?Uses home oxygen [] ?Productive cough[] ?Hemoptysis  [] ?Wheeze [] ?COPD [] ?Asthma Neurologic: [] ?Dizziness [] ?Blackouts [] ?Seizures [] ?History of stroke [] ?History of TIA[] ?Aphasia [] ?Temporary blindness[] ?Dysphagia [x] ?Weaknessor numbness in arms [] ?Weakness or numbnessin legs Musculoskeletal: [] ?Arthritis [] ?Joint swelling [] ?Joint pain [] ?Low back pain Hematologic:[] ?Easy bruising[] ?Easy bleeding [] ?Hypercoagulable state [] ?Anemic  Gastrointestinal:[] ?Blood in stool[] ?Vomiting blood[] ?Gastroesophageal reflux/heartburn[] ?Abdominal pain Genitourinary: [] ?Chronic kidney disease [] ?Difficulturination [] ?Frequenturination [] ?Burning with urination[] ?Hematuria Skin: [] ?Rashes [] ?Ulcers [] ?Wounds Psychological: [] ?History of anxiety[] ?History of major depression.     Physical Examination  BP (!) 151/54 (BP Location: Left Arm, Patient Position: Sitting, Cuff Size: Normal)   Pulse 67   Resp 10   Ht 5' 11"  (1.803 m)   Wt 154 lb (69.9 kg)   BMI 21.48 kg/m  Gen:  WD/WN, NAD. Appears younger than stated age. Head: /AT, No temporalis wasting. Ear/Nose/Throat: Hearing grossly intact, nares w/o erythema or drainage Eyes: Conjunctiva  clear. Sclera non-icteric Neck: Supple.  Trachea midline Pulmonary:  Good air movement, no use of accessory muscles.  Cardiac: RRR, no JVD Vascular:  Vessel Right Left  Radial Palpable Palpable                          PT Palpable 1+ Palpable  DP Palpable Trace Palpable    Musculoskeletal: M/S 5/5 throughout.  No deformity or atrophy. Trace RLE edema. Neurologic: Sensation grossly intact in extremities.  Symmetrical.  Speech is fluent.  Psychiatric: Judgment intact, Mood & affect appropriate for pt's clinical situation. Dermatologic: No rashes or ulcers noted.  No cellulitis or open wounds.       Labs No results found for this or any previous visit (from the past 2160 hour(s)).  Radiology No results found.  Assessment/Plan  Centrilobular Emphysema Follows with a pulmonologist. Reasonably stable  Carotid stenosis Asymptomatic. Duplex shows stable 1-39% carotid artery stenosis bilaterally. Continue aspirin therapy. Recheck in 1 year.   Cervical stenosis of spinal canal Better after surgery  Atherosclerosis of native arteries of extremity with intermittent claudication (HCC) ABIs today are stable and normal on the right at 1.10 with triphasic waveforms.  His left ABI is actually about 10 points higher today and is at 0.56 although his digital index is 0.54. At this point, he is tolerating his PAD quite well and has buildup good collateral flow to support the left leg.  No role for intervention.  Plan to check on an annual basis unless problems develop in the interim.    Leotis Pain, MD  08/17/2018 12:12 PM    This note was created with Dragon medical transcription system.  Any errors from dictation are purely unintentional

## 2018-08-17 NOTE — Assessment & Plan Note (Signed)
ABIs today are stable and normal on the right at 1.10 with triphasic waveforms.  His left ABI is actually about 10 points higher today and is at 0.56 although his digital index is 0.54. At this point, he is tolerating his PAD quite well and has buildup good collateral flow to support the left leg.  No role for intervention.  Plan to check on an annual basis unless problems develop in the interim.

## 2018-08-17 NOTE — Patient Instructions (Signed)
Peripheral Vascular Disease  Peripheral vascular disease (PVD) is a disease of the blood vessels that are not part of your heart and brain. A simple term for PVD is poor circulation. In most cases, PVD narrows the blood vessels that carry blood from your heart to the rest of your body. This can reduce the supply of blood to your arms, legs, and internal organs, like your stomach or kidneys. However, PVD most often affects a person's lower legs and feet. Without treatment, PVD tends to get worse. PVD can also lead to acute ischemic limb. This is when an arm or leg suddenly cannot get enough blood. This is a medical emergency. Follow these instructions at home: Lifestyle  Do not use any products that contain nicotine or tobacco, such as cigarettes and e-cigarettes. If you need help quitting, ask your doctor.  Lose weight if you are overweight. Or, stay at a healthy weight as told by your doctor.  Eat a diet that is low in fat and cholesterol. If you need help, ask your doctor.  Exercise regularly. Ask your doctor for activities that are right for you. General instructions  Take over-the-counter and prescription medicines only as told by your doctor.  Take good care of your feet: ? Wear comfortable shoes that fit well. ? Check your feet often for any cuts or sores.  Keep all follow-up visits as told by your doctor This is important. Contact a doctor if:  You have cramps in your legs when you walk.  You have leg pain when you are at rest.  You have coldness in a leg or foot.  Your skin changes.  You are unable to get or have an erection (erectile dysfunction).  You have cuts or sores on your feet that do not heal. Get help right away if:  Your arm or leg turns cold, numb, and blue.  Your arms or legs become red, warm, swollen, painful, or numb.  You have chest pain.  You have trouble breathing.  You suddenly have weakness in your face, arm, or leg.  You become very  confused or you cannot speak.  You suddenly have a very bad headache.  You suddenly cannot see. Summary  Peripheral vascular disease (PVD) is a disease of the blood vessels.  A simple term for PVD is poor circulation. Without treatment, PVD tends to get worse.  Treatment may include exercise, low fat and low cholesterol diet, and quitting smoking. This information is not intended to replace advice given to you by your health care provider. Make sure you discuss any questions you have with your health care provider. Document Released: 04/23/2009 Document Revised: 01/09/2017 Document Reviewed: 03/06/2016 Elsevier Patient Education  2020 Elsevier Inc.  

## 2018-08-20 ENCOUNTER — Telehealth: Payer: Self-pay | Admitting: Internal Medicine

## 2018-08-20 NOTE — Telephone Encounter (Signed)
Spoke with patient regarding Covid-19 screening questions Covid-19 Screening Questions:  Do you now or have you had a fever in the last 14 days? no  Do you have any respiratory symptoms of shortness of breath or cough now or in the last 14 days? no  Do you have any family members or close contacts with diagnosed or suspected Covid-19 in the past 14 days? no  Have you been tested for Covid-19 and found to be positive? no  Pt made aware of that care partner may wait in the car or come up to the lobby during the procedure but will need to provide their own mask.

## 2018-08-23 ENCOUNTER — Ambulatory Visit (AMBULATORY_SURGERY_CENTER): Payer: PPO | Admitting: Internal Medicine

## 2018-08-23 ENCOUNTER — Other Ambulatory Visit: Payer: Self-pay

## 2018-08-23 ENCOUNTER — Encounter: Payer: Self-pay | Admitting: Internal Medicine

## 2018-08-23 ENCOUNTER — Other Ambulatory Visit: Payer: Self-pay | Admitting: Pharmacy Technician

## 2018-08-23 VITALS — BP 118/62 | HR 64 | Temp 98.5°F | Resp 19 | Ht 71.0 in | Wt 150.0 lb

## 2018-08-23 DIAGNOSIS — Z86010 Personal history of colonic polyps: Secondary | ICD-10-CM

## 2018-08-23 DIAGNOSIS — D120 Benign neoplasm of cecum: Secondary | ICD-10-CM

## 2018-08-23 DIAGNOSIS — D123 Benign neoplasm of transverse colon: Secondary | ICD-10-CM

## 2018-08-23 DIAGNOSIS — K621 Rectal polyp: Secondary | ICD-10-CM

## 2018-08-23 DIAGNOSIS — K6289 Other specified diseases of anus and rectum: Secondary | ICD-10-CM | POA: Diagnosis not present

## 2018-08-23 DIAGNOSIS — J449 Chronic obstructive pulmonary disease, unspecified: Secondary | ICD-10-CM | POA: Diagnosis not present

## 2018-08-23 MED ORDER — SODIUM CHLORIDE 0.9 % IV SOLN: 500.0000 mL | Freq: Once | INTRAVENOUS | Status: DC

## 2018-08-23 NOTE — Progress Notes (Signed)
Vitals JB Temp CB

## 2018-08-23 NOTE — Progress Notes (Signed)
Report given to PACU, vss 

## 2018-08-23 NOTE — Op Note (Signed)
[place] Patient Name: Antonio [name] Procedure Date: 08/23/2018 2:35 PM MRN: [id number] Endoscopist: Jerene Bears , MD Age: 73 Referring MD:  Date of Birth: November 24, 1945 Gender: Male Account #: [id number] Procedure:                Colonoscopy Indications:              Surveillance: Personal history of adenomatous                            polyps on last colonoscopy 3 years ago; personal                            history of ulcerative colitis/proctitis Medicines:                Monitored Anesthesia Care Procedure:                Pre-Anesthesia Assessment:                           - Prior to the procedure, a History and Physical                            was performed, and patient medications and                            allergies were reviewed. The patient's tolerance of                            previous anesthesia was also reviewed. The risks                            and benefits of the procedure and the sedation                            options and risks were discussed with the patient.                            All questions were answered, and informed consent                            was obtained. Prior Anticoagulants: The patient has                            taken no previous anticoagulant or antiplatelet                            agents. ASA Grade Assessment: III - A patient with                            severe systemic disease. After reviewing the risks                            and benefits, the patient was deemed in  satisfactory condition to undergo the procedure.                           After obtaining informed consent, the colonoscope                            was passed under direct vision. Throughout the                            procedure, the patient's blood pressure, pulse, and                            oxygen saturations were monitored continuously. The                            Colonoscope was introduced  through the anus and                            advanced to the cecum, identified by appendiceal                            orifice and ileocecal valve. The colonoscopy was                            performed without difficulty. The patient tolerated                            the procedure well. The quality of the bowel                            preparation was good. The ileocecal valve,                            appendiceal orifice, and rectum were photographed. Scope In: 2:45:09 PM Scope Out: 3:07:42 PM Scope Withdrawal Time: 0 hours 16 minutes 57 seconds  Total Procedure Duration: 0 hours 22 minutes 33 seconds  Findings:                 The digital rectal exam was normal.                           Four sessile polyps were found in the cecum. The                            polyps were 2 to 5 mm in size. These polyps were                            removed with a cold snare. Resection and retrieval                            were complete.                           Two sessile polyps were found in the transverse  colon. The polyps were 4 to 5 mm in size. These                            polyps were removed with a cold snare. Resection                            and retrieval were complete.                           Mildly erythematous mucosa was found in the rectum.                            This was biopsied with a cold forceps for histology                            given history of ulcerative colitis.                           Internal hemorrhoids were found during retroflexion.                           The exam was otherwise without abnormality. Complications:            No immediate complications. Estimated Blood Loss:     Estimated blood loss was minimal. Impression:               - Four 2 to 5 mm polyps in the cecum, removed with                            a cold snare. Resected and retrieved.                           - Two 4 to 5 mm polyps in  the transverse colon,                            removed with a cold snare. Resected and retrieved.                           - Erythematous mucosa in the rectum. Biopsied.                           - Internal hemorrhoids. Recommendation:           - Patient has a contact number available for                            emergencies. The signs and symptoms of potential                            delayed complications were discussed with the                            patient. Return to normal activities tomorrow.  Written discharge instructions were provided to the                            patient.                           - Resume previous diet.                           - Continue present medications.                           - Await pathology results.                           - Repeat colonoscopy is recommended for                            surveillance. The colonoscopy date will be                            determined after pathology results from today's                            exam become available for review. Jerene Bears, MD 08/23/2018 3:14:27 PM This report has been signed electronically.

## 2018-08-23 NOTE — Patient Instructions (Signed)
Impression/Recommendations:  Polyp handout given to patient. Hemorrhoid handout given to patient.  Resume previous diet. Continue present medications.  YOU HAD AN ENDOSCOPIC PROCEDURE TODAY AT [facility]:   Refer to the procedure report that was given to you for any specific questions about what was found during the examination.  If the procedure report does not answer your questions, please call your gastroenterologist to clarify.  If you requested that your care partner not be given the details of your procedure findings, then the procedure report has been included in a sealed envelope for you to review at your convenience later.  YOU SHOULD EXPECT: Some feelings of bloating in the abdomen. Passage of more gas than usual.  Walking can help get rid of the air that was put into your GI tract during the procedure and reduce the bloating. If you had a lower endoscopy (such as a colonoscopy or flexible sigmoidoscopy) you may notice spotting of blood in your stool or on the toilet paper. If you underwent a bowel prep for your procedure, you may not have a normal bowel movement for a few days.  Please Note:  You might notice some irritation and congestion in your nose or some drainage.  This is from the oxygen used during your procedure.  There is no need for concern and it should clear up in a day or so.  SYMPTOMS TO REPORT IMMEDIATELY:   Following lower endoscopy (colonoscopy or flexible sigmoidoscopy):  Excessive amounts of blood in the stool  Significant tenderness or worsening of abdominal pains  Swelling of the abdomen that is new, acute  Fever of 100F or higher For urgent or emergent issues, a gastroenterologist can be reached at any hour by calling [phone].   DIET:  We do recommend a small meal at first, but then you may proceed to your regular diet.  Drink plenty of fluids but you should avoid alcoholic beverages for 24 hours.  ACTIVITY:  You should plan to  take it easy for the rest of today and you should NOT DRIVE or use heavy machinery until tomorrow (because of the sedation medicines used during the test).    FOLLOW UP: Our staff will call the number listed on your records 48-72 hours following your procedure to check on you and address any questions or concerns that you may have regarding the information given to you following your procedure. If we do not reach you, we will leave a message.  We will attempt to reach you two times.  During this call, we will ask if you have developed any symptoms of COVID 19. If you develop any symptoms (ie: fever, flu-like symptoms, shortness of breath, cough etc.) before then, please call [phone].  If you test positive for Covid 19 in the 2 weeks post procedure, please call and report this information to [place].    If any biopsies were taken you will be contacted by phone or by letter within the next 1-3 weeks.  Please call us at [phone] if you have not heard about the biopsies in 3 weeks.    SIGNATURES/CONFIDENTIALITY: You and/or your care partner have signed paperwork which will be entered into your electronic medical record.  These signatures attest to the fact that that the information above on your After Visit Summary has been reviewed and is understood.  Full responsibility of the confidentiality of this discharge information lies with you and/or your care-partner.

## 2018-08-23 NOTE — Patient Outreach (Signed)
[facility]) Care Management  08/23/2018  Antonio [name] 1946-01-11 [id number]   Received patient portion(s) of patient assistance application for Spiriva, Dulera, Proventil HFA and Lialda. Faxed completed application and required documents into [employer] for Spiriva Respimat. Prepared to mail out completed [employer] application for [employer] and Proventil HFA. Faxed completed application for Lialda into [place].   Will follow up with [employer] in 5-7, [employer] in 10-14 and [employer] in 7-10 business days  to check status of application.  [name] [name] [place] Certified Pharmacy Technician [place] Management Direct Dial:[phone]

## 2018-08-23 NOTE — Progress Notes (Signed)
Called to room to assist during endoscopic procedure.  Patient ID and intended procedure confirmed with present staff. Received instructions for my participation in the procedure from the performing physician.  

## 2018-08-24 ENCOUNTER — Other Ambulatory Visit: Payer: Self-pay | Admitting: Internal Medicine

## 2018-08-25 ENCOUNTER — Telehealth: Payer: Self-pay

## 2018-08-25 NOTE — Telephone Encounter (Signed)
  Follow up Call-  Call back number 08/23/2018  Post procedure Call Back phone  # [phone]  Permission to leave phone message Yes  Some recent data might be hidden     Patient questions:  Do you have a fever, pain , or abdominal swelling? No. Pain Score  0 *  Have you tolerated food without any problems? Yes.    Have you been able to return to your normal activities? Yes.    Do you have any questions about your discharge instructions: Diet   No. Medications  No. Follow up visit  No.  Do you have questions or concerns about your Care? No.  Actions: * If pain score is 4 or above: 1. No action needed, pain <4.Have you developed a fever since your procedure? no  2.   Have you had an respiratory symptoms (SOB or cough) since your procedure? no  3.   Have you tested positive for COVID 19 since your procedure no  4.   Have you had any family members/close contacts diagnosed with the COVID 19 since your procedure?  no   If yes to any of these questions please route to Joylene John, RN and Alphonsa Gin, Therapist, sports.

## 2018-08-30 ENCOUNTER — Other Ambulatory Visit: Payer: Self-pay | Admitting: Pharmacy Technician

## 2018-08-30 ENCOUNTER — Encounter: Payer: Self-pay | Admitting: Internal Medicine

## 2018-08-30 NOTE — Patient Outreach (Signed)
[employer] [facility]) Care Management  08/30/2018  Antonio [name] [birth year] [id number]   Received in basket message from Dixon Boos from providers office stating that office received a note from Bernita Buffy [facility]) stating patients phone number is missing on application.  Refaxed document into company and will follow up with them in 3-5 business days to check application status.  [name] [name] [place] Certified Pharmacy Technician [place] Management Direct Dial:[phone]

## 2018-09-01 ENCOUNTER — Telehealth: Payer: Self-pay | Admitting: Family Medicine

## 2018-09-01 NOTE — Telephone Encounter (Signed)
Call and get him on for tomorrow please. He is a copd pt- not concerned for covid

## 2018-09-01 NOTE — Telephone Encounter (Signed)
Wife called in said husband is congestion in chest, no fever, cough.

## 2018-09-02 ENCOUNTER — Ambulatory Visit (INDEPENDENT_AMBULATORY_CARE_PROVIDER_SITE_OTHER): Payer: PPO | Admitting: Family Medicine

## 2018-09-02 ENCOUNTER — Other Ambulatory Visit: Payer: Self-pay

## 2018-09-02 ENCOUNTER — Other Ambulatory Visit: Payer: Self-pay | Admitting: Pharmacy Technician

## 2018-09-02 ENCOUNTER — Encounter: Payer: Self-pay | Admitting: Family Medicine

## 2018-09-02 VITALS — BP 120/60 | HR 80 | Temp 98.2°F | Ht 71.0 in | Wt 151.0 lb

## 2018-09-02 DIAGNOSIS — J449 Chronic obstructive pulmonary disease, unspecified: Secondary | ICD-10-CM | POA: Diagnosis not present

## 2018-09-02 DIAGNOSIS — Z20822 Contact with and (suspected) exposure to covid-19: Secondary | ICD-10-CM

## 2018-09-02 DIAGNOSIS — J0100 Acute maxillary sinusitis, unspecified: Secondary | ICD-10-CM | POA: Diagnosis not present

## 2018-09-02 DIAGNOSIS — J440 Chronic obstructive pulmonary disease with acute lower respiratory infection: Secondary | ICD-10-CM

## 2018-09-02 DIAGNOSIS — R6889 Other general symptoms and signs: Secondary | ICD-10-CM

## 2018-09-02 DIAGNOSIS — J209 Acute bronchitis, unspecified: Secondary | ICD-10-CM | POA: Diagnosis not present

## 2018-09-02 MED ORDER — AZITHROMYCIN 250 MG PO TABS: ORAL_TABLET | ORAL | 0 refills | Status: DC

## 2018-09-02 MED ORDER — PREDNISONE 10 MG PO TABS: ORAL_TABLET | ORAL | 1 refills | Status: DC

## 2018-09-02 MED ORDER — GUAIFENESIN-CODEINE 100-10 MG/5ML PO SYRP: 5.0000 mL | ORAL_SOLUTION | Freq: Three times a day (TID) | ORAL | 0 refills | Status: DC | PRN

## 2018-09-02 NOTE — Patient Outreach (Signed)
[facility]) Care Management  09/02/2018  Onyx Schirmer [birth year] [id number]    Follow up call placed to [facility] regarding patient assistance application(s) for Shana [place] states application is still being reviewed and suggested following up in 2-3 business days.     Follow up call placed to [employer] regarding patient assistance application(s) for Spiriva Respimat , April confrims patient has been approved as of 7/18 until 02/10/19. Shipment of medication was mailed out on 7/22 and should arrive at patients home in 10-14 business days.  Follow up:  Will follow up with [employer] in 2-3 business days to check application status for Lialda.  [name] [name] [place] Certified Pharmacy Technician [place] Management Direct [phone]

## 2018-09-02 NOTE — Progress Notes (Signed)
Date:  09/02/2018   Name:  Antonio [name]   DOB:  03/11/1945   MRN:  [id number]   Chief Complaint: Sinusitis (started on ZPack last Saturday, finished Wed- "just can't seem to get better. Wants to listen to lungs)  Sinusitis This is a new problem. The current episode started in the past 7 days. The problem has been gradually worsening since onset. There has been no fever. The pain is moderate. Associated symptoms include congestion, coughing, a hoarse voice, shortness of breath and sinus pressure. Pertinent negatives include no chills, diaphoresis, ear pain, headaches, neck pain, sneezing, sore throat or swollen glands. Treatments tried: inhalers. The treatment provided moderate relief.    Review of Systems  Constitutional: Negative for chills, diaphoresis and fever.  HENT: Positive for congestion, hoarse voice and sinus pressure. Negative for drooling, ear discharge, ear pain, sneezing and sore throat.   Respiratory: Positive for cough and shortness of breath. Negative for wheezing.   Cardiovascular: Negative for chest pain, palpitations and leg swelling.  Gastrointestinal: Negative for abdominal pain, blood in stool, constipation, diarrhea and nausea.  Endocrine: Negative for polydipsia.  Genitourinary: Negative for dysuria, frequency, hematuria and urgency.  Musculoskeletal: Negative for back pain, myalgias and neck pain.  Skin: Negative for rash.  Allergic/Immunologic: Negative for environmental allergies.  Neurological: Negative for dizziness and headaches.  Hematological: Does not bruise/bleed easily.  Psychiatric/Behavioral: Negative for suicidal ideas. The patient is not nervous/anxious.     Patient Active Problem List   Diagnosis Date Noted  . 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 Diffusion capacity of lung (dl), decreased 03/11/2018  . COPD with acute exacerbation ([place]) 06/22/2017  . Ulcerative colitis with complication ([place]) 12/11/5943  . Cervical  stenosis of spinal canal 05/05/2016  . Carotid stenosis 05/02/2016  . Atherosclerosis of native arteries of extremity with intermittent claudication ([place]) 05/02/2016  . Diarrhea 06/09/2014  . Centrilobular Emphysema 04/25/2014  . Tobacco abuse 04/25/2014    Allergies  Allergen Reactions  . Penicillins Rash    Has patient had a PCN reaction causing immediate rash, facial/tongue/throat swelling, SOB or lightheadedness with hypotension:Yes Has patient had a PCN reaction causing severe rash involving mucus membranes or skin necrosis:Yes Has patient had a PCN reaction that required hospitalization:No Has patient had a PCN reaction occurring within the last 10 years:No If all of the above answers are "NO", then may proceed with Cephalosporin use.     Past Surgical History:  Procedure Laterality Date  . ANTERIOR CERVICAL DECOMP/DISCECTOMY FUSION N/A 05/05/2016   Procedure: ANTERIOR CERVICAL DECOMPRESSION FUSION CERVICAL THREE-FOUR.;  Surgeon: Earnie Larsson, MD;  Location: [facility];  Service: Neurosurgery;  Laterality: N/A;  right side approach  . CERVICAL DISCECTOMY  04/2016   C3-C4  . COLONOSCOPY    . COLONOSCOPY  08/09/2015  . [place]  . leg stent Left approx 5-6 yrs ago   2 to left leg  . MENISCUS REPAIR Left 12/2013  . POLYPECTOMY    . ROTATOR CUFF REPAIR Left 2007  . TONSILLECTOMY AND ADENOIDECTOMY  1957    Social History   Tobacco Use  . Smoking status: Former Smoker    Packs/day: 0.75    Years: 35.00    Pack years: 26.25    Types: Cigarettes, Cigars    Quit date: 06/11/2011    Years since quitting: 7.2  . Smokeless tobacco: Never Used  . Tobacco comment: smoking cessation materials not required  Substance Use Topics  . Alcohol use:  Yes    Alcohol/week: 7.0 standard drinks    Types: 7 Standard drinks or equivalent per week    Comment: 1-2 day   . Drug use: No     Medication list has been reviewed and updated.  Current Meds  Medication Sig  .  albuterol (PROVENTIL) (2.5 MG/3ML) 0.083% nebulizer solution Take 3 mLs (2.5 mg total) by nebulization every 6 (six) hours as needed for wheezing or shortness of breath.  Marland Kitchen albuterol (VENTOLIN HFA) 108 (90 Base) MCG/ACT inhaler INHALE 2 PUFFS INTO THE LUNGS EVERY 6 HOURS AS NEEDED FOR WHEEZING OR SHORTNESS OF BREATH  . Ascorbic Acid (VITAMIN C PO) Take by mouth.  . budesonide-formoterol (SYMBICORT) 160-4.5 MCG/ACT inhaler Inhale 2 puffs into the lungs every 12 (twelve) hours.  . cetirizine (ZYRTEC) 10 MG tablet Take 10 mg by mouth daily.  Marland Kitchen ipratropium-albuterol (DUONEB) 0.5-2.5 (3) MG/3ML SOLN USE 3 ML VIA NEBULIZER EVERY 6 HOURS (Patient taking differently: 3 mLs every 6 (six) hours as needed. USE 3 ML VIA NEBULIZER EVERY 6 HOURS)  . loperamide (IMODIUM) 2 MG capsule Take 2 mg by mouth daily as needed for diarrhea or loose stools.   Marland Kitchen MAGNESIUM PO Take by mouth.  . mesalamine (LIALDA) 1.2 g EC tablet TAKE 4 TABLETS BY MOUTH DAILY WITH BREAKFAST  . mometasone-formoterol (DULERA) 200-5 MCG/ACT AERO Inhale 2 puffs into the lungs 2 (two) times daily.  . Multiple Vitamins-Minerals (ZINC PO) Take by mouth.  . Tiotropium Bromide Monohydrate (SPIRIVA RESPIMAT) 2.5 MCG/ACT AERS Inhale 2 puffs into the lungs daily.  Marland Kitchen VITAMIN D PO Take by mouth.  . [DISCONTINUED] Tiotropium Bromide Monohydrate (SPIRIVA RESPIMAT) 2.5 MCG/ACT AERS Inhale 2 puffs into the lungs daily.   Current Facility-Administered Medications for the 09/02/18 encounter (Office Visit) with [name], MD  Medication  . 0.9 %  sodium chloride infusion    PHQ 2/9 Scores 07/21/2018 08/03/2017 04/16/2017 04/16/2017  PHQ - 2 Score 0 1 0 0  PHQ- 9 Score - 5 0 -    BP Readings from Last 3 Encounters:  09/02/18 120/60  08/23/18 118/62  08/17/18 (!) 151/54    Physical Exam Vitals signs and nursing note reviewed.  HENT:     Head: Normocephalic.     Jaw: There is normal jaw occlusion.     Right Ear: External ear normal.     Left Ear:  External ear normal.     Nose: Nose normal.  Eyes:     General: No scleral icterus.       Right eye: No discharge.        Left eye: No discharge.     Conjunctiva/sclera: Conjunctivae normal.     Pupils: Pupils are equal, round, and reactive to light.  Neck:     Musculoskeletal: Normal range of motion and neck supple.     Thyroid: No thyromegaly.     Vascular: No JVD.     Trachea: No tracheal deviation.  Cardiovascular:     Rate and Rhythm: Normal rate and regular rhythm.     Heart sounds: Normal heart sounds. No murmur. No friction rub. No gallop.   Pulmonary:     Effort: No respiratory distress.     Breath sounds: Wheezing present. No decreased breath sounds, rhonchi or rales.  Abdominal:     General: Bowel sounds are normal.     Palpations: Abdomen is soft. There is no mass.     Tenderness: There is no abdominal tenderness. There is no guarding  or rebound.  Musculoskeletal: Normal range of motion.        General: No tenderness.  Lymphadenopathy:     Cervical: No cervical adenopathy.  Skin:    General: Skin is warm.     Findings: No rash.  Neurological:     Mental Status: He is alert and oriented to person, place, and time.     Cranial Nerves: No cranial nerve deficit.     Deep Tendon Reflexes: Reflexes are normal and symmetric.     Wt Readings from Last 3 Encounters:  09/02/18 151 lb (68.5 kg)  08/23/18 150 lb (68 kg)  08/17/18 154 lb (69.9 kg)    BP 120/60   Pulse 80   Temp 98.2 F (36.8 C) (Oral)   Ht 5' 11"  (1.803 m)   Wt 151 lb (68.5 kg)   BMI 21.06 kg/m   Assessment and Plan:  1. COPD with acute bronchitis (HCC) Chronic.  With acute exacerbations on occasional basis.  Today patient presents with a weeklong history of exacerbation of his COPD with acute bronchitis.  Patient has been on his inhalers including long-acting beta agonist and inhaled steroid and anticholinergic inhalation.  Patient has been instructed to initiate prednisone on a taper 40 mg for  3 days followed by 30 for 3 days followed by 20 for 3 days and ending with 10 mg for 3 days. - predniSONE (DELTASONE) 10 MG tablet; Taper 4,4,4,3,3,3,2,2,2,1,1,1  Dispense: 45 tablet; Refill: 1 - guaiFENesin-codeine (ROBITUSSIN AC) 100-10 MG/5ML syrup; Take 5 mLs by mouth 3 (three) times daily as needed for cough.  Dispense: 100 mL; Refill: 0 - Novel Coronavirus, NAA ([facility])  2. Acute maxillary sinusitis, recurrence not specified Patient has an acute sinusitis with exam consistent with this we will initiate a azithromycin 250 mg 2 today and 1 a day for 4 days. - azithromycin (ZITHROMAX) 250 MG tablet; 2 today then 1 a day for 4 days  Dispense: 6 tablet; Refill: 0  3. Centrilobular Emphysema Patient has history of centrilobular emphysema will continue DuoNeb nebulizations instead of handheld short-acting agonist. - predniSONE (DELTASONE) 10 MG tablet; Taper 4,4,4,3,3,3,2,2,2,1,1,1  Dispense: 45 tablet; Refill: 1 - guaiFENesin-codeine (ROBITUSSIN AC) 100-10 MG/5ML syrup; Take 5 mLs by mouth 3 (three) times daily as needed for cough.  Dispense: 100 mL; Refill: 0 - Novel Coronavirus, NAA ([facility])  4. Suspected 2019 Novel Coronavirus Infection Patient has had a persistent cough for over a week with no fever.  Patient is also has severe COPD for which he is on multiple inhalation therapies.  Patient does have shortness of breath but no significant chest pain.  Patient has been sent to be evaluated for coronavirus.

## 2018-09-03 ENCOUNTER — Other Ambulatory Visit: Payer: Self-pay | Admitting: Pharmacy Technician

## 2018-09-03 NOTE — Patient Outreach (Signed)
[facility]) Care Management  09/03/2018  Antonio [name] 1945/08/20 [id number]   Received in basket message from [facility] @ Dr. Vena Rua office that office received Approval notice of patients Lialda from [place].  Will follow up with patient once update from [employer] patient assistance for [facility] and Proventil has been obtained.  [name] [name] [name] Certified Pharmacy Technician [place] Management Direct [phone]

## 2018-09-07 ENCOUNTER — Other Ambulatory Visit: Payer: Self-pay | Admitting: Pharmacy Technician

## 2018-09-07 NOTE — Patient Outreach (Addendum)
[place] [facility]) Care Management  09/07/2018  Antonio [name] October 22, 1945 [id number]     Follow up call placed to [employer] regarding patient assistance application(s) for [facility] and Proventil HFA , Di Kindle states that application has been received and attestation form has been mailed out to patient as of 7/25.   Unsuccessful call placed to patient, to inform of attestation form being mailed out. Left message with patient's wife requesting a return call.  Follow up:  Will make 2nd call attempt in 2-3 business days if call has not been returned.  [name] [name] [place] Certified Pharmacy Technician [place] Management Direct Dial:[phone]  ADDENDUM 11:11am  Return call from patient, HIPAA identifiers verified. Informed patient of above details. Patient states that he has not received Spiriva Respimat from [employer] or Lialda from [place] but received letters stating he had been approved.  Requested patient contact me once attestation form has been received from [employer].  Will follow up with patient in 2-3 business days if patient has not contacted me.  [name] [name] [place] Certified Pharmacy Technician [place] Management Direct Dial:[phone]

## 2018-09-10 ENCOUNTER — Other Ambulatory Visit: Payer: Self-pay | Admitting: Pharmacy Technician

## 2018-09-10 NOTE — Patient Outreach (Signed)
[name]) Care Management  09/10/2018  Antonio [name] November 19, 1945 [id number]    Incoming call from patient regarding patient assistance receipt of attestation form from [place] for [employer] and Proventil HFA, HIPAA identifiers verified. Assisted [name] with filling out form.  Follow up:  Will follow up with [employer] in 7-10 business days to check application status.  [name] [name] [place] Certified Pharmacy Technician [place] Management Direct [phone]

## 2018-09-15 ENCOUNTER — Telehealth: Payer: Self-pay | Admitting: Pulmonary Disease

## 2018-09-15 ENCOUNTER — Other Ambulatory Visit: Payer: Self-pay

## 2018-09-15 DIAGNOSIS — J449 Chronic obstructive pulmonary disease, unspecified: Secondary | ICD-10-CM

## 2018-09-15 DIAGNOSIS — Z20822 Contact with and (suspected) exposure to covid-19: Secondary | ICD-10-CM

## 2018-09-15 DIAGNOSIS — R059 Cough, unspecified: Secondary | ICD-10-CM

## 2018-09-15 DIAGNOSIS — R05 Cough: Secondary | ICD-10-CM

## 2018-09-15 MED ORDER — PREDNISONE 10 MG PO TABS: ORAL_TABLET | ORAL | 0 refills | Status: DC

## 2018-09-15 MED ORDER — LEVOFLOXACIN 500 MG PO TABS: 500.0000 mg | ORAL_TABLET | Freq: Every day | ORAL | 0 refills | Status: DC

## 2018-09-15 NOTE — Telephone Encounter (Signed)
Spoke with pt. He is aware of Brian's response. Orders have been placed. Sputum cups have been left at the front. Rxs have been sent in. Nothing further ws needed.

## 2018-09-15 NOTE — Telephone Encounter (Signed)
Just to clarify the patient has had 2 rounds of azithromycin?  Likely we need to do a few things.  I would recommend the patient get outpatient COVID testing:  Please place the order for [id number] for COVID testing.  Place order as normal and lab collect.  Appointments are not required and please instruct the patient to present to 1 of these testing sites listed below whichever is closest and most convenient for the patient.  COVID Testing Site Locations (For sick patients only, pre-procedure is done differently)  . [place] [address], [place], [place] [address] . [place]  (on [employer])  . [facility] [address], [place] (across from [employer])     I also believe the patient should pick up a sputum cup and we should test his sputum 3 ways (respiratory sputum culture, AFB, fungal).  Patient should also be set up for a in person office visit in 1 to 2 weeks after we have a confirmed negative COVID test.  For right now based off of acute symptoms the patient is still having a productive cough, discolored sputum as well as wheezing then we can consider antibiotics as well as another course of prednisone.  Can offer:   Levaquin 516m tablet >>> Take 1 500 mg tablet daily for the next 7 days >>> Take with food >>> start probiotic for good gut health >>>avoid rigorous exercise for next 2 weeks   Prednisone 144mtablet  >>>4 tabs for 2 days, then 3 tabs for 2 days, 2 tabs for 2 days, then 1 tab for 2 days, then stop >>>take with food  >>>take in the morning    Please place the order.   Patient should have in person office visit scheduled by the end of next week.  If we do not have negative COVID testing by then then we can move that appointment to the following week.  BrWyn QuakerNP

## 2018-09-15 NOTE — Telephone Encounter (Signed)
Primary Pulmonologist: BQ Last office visit and with whom: 07/19/2018 with Antonio [name] What do we see them for (pulmonary problems): emphysema Last OV assessment/plan: Instructions    Return in about 3 months (around 10/19/2018), or if symptoms worsen or fail to improve, for Follow up with [name] FNP-C, Follow up with Dr. [place] Bells. [place]    Continue Symbicort160 >>> 2 puffs in the morning right when you wake up, rinse out your mouth after use, 12 hours later 2 puffs, rinse after use >>> Take this daily, no matter what >>> This is not a rescue inhaler  ContinueSpiriva Respimat 2.5 >>> 2 puffs daily >>> Do this every day >>>This is not a rescue inhaler  Referral to [facility] Case Management  >>>they will call you about medication costs   Can use DuoNeb nebulized medications every to 8 hours as needed for shortness of breath and wheezing    Note your daily symptoms >remember "red flags" for COPD:  >>>Increase in cough >>>increase in sputum production >>>increase in shortness of breath or activity intolerance.   If you notice these symptoms, please call the office to be seen.   Continue Zyrtec daily  Continue Flonase as needed for nasal congestion / allergy symptoms    Can consider biologic therapy such as for center [place] in the future if you have recurrent flares to your breathing    Start nasal saline rinses after yard work  Attempt to wear a mask when doing yard work   Return in about 3 months (around 10/19/2018), or if symptoms worsen or fail to improve, for Follow up with [name] FNP-C, Follow up with Dr. [place] Bells.      Was appointment offered to patient (explain)?  Pt wants recommendations   Reason for call: Called and spoke with pt who stated for 2 weeks, he has had a runny nose with yellow nasal drainage, coughing up phlegm that is clear, and has congestion in chest. Pt said he has been on two different rounds of zpak abx but is  still coughing. Pt states that he is currently on prednisone Rx and has three days left of the prednisone. Pt said that PCP prescribed the prednisone and she was also the one who prescribed the abx for pt. Pt said after this current round of prednisone, he does have another refill if he needs to take it.  Pt said that his symptoms began with a head cold. Pt takes the symbicort bid as prescribed and uses the spiriva daily as prescribed. Pt said that he has had to use the rescue inhaler twice daily and also has used his nebulizer 2-3 times daily to see if this would help with his symptoms. Pt said that he has gotten some relief from the neb treatments.  Pt denies any fever, no chills, no body aches, no headache, no loss of smell or loss of taste, no weakness, no diarrhea, no muscle or abdominal pain,no rash, no sore throat, no nausea or vomiting. Pt has not had to be tested for COVID.  Pt wants to know what we recommend to help with his symptoms. Antonio [name], please advise on this for pt. Thanks!

## 2018-09-17 LAB — NOVEL CORONAVIRUS, NAA: SARS-CoV-2, NAA: NOT DETECTED

## 2018-09-17 NOTE — Progress Notes (Signed)
SARS-CoV-2 negative.  This is good news.  Hinton Dyer, can we get this patient scheduled for a follow-up with me in person next week.  As he has had multiple rounds of antibiotics and I want to ensure that he is improving.  [name], FNP

## 2018-09-19 NOTE — Progress Notes (Signed)
@Patient  ID: Antonio [name], male    DOB: Oct 14, 1945, 73 y.o.   MRN: [id number]  Chief Complaint  Patient presents with  . Follow-up    breathing doing good but feels congestion in throat and can't get it out    Referring provider: [name], MD  HPI:  73 year old male former smoker followed in our office for severe COPD with emphysema  PMH: CAD Smoker/ Smoking History: Former smoker quit 2013.  26.25 pack years Maintenance: Symbicort 160, Spiriva 2.5 Pt of: Dr. Lake Bells  09/20/2018  - Visit   73 year old male former smoker followed in our office for severe COPD with emphysema.  Patient was treated telephonically and scheduled for a close follow-up after testing negative for SARS-CoV-2 in the outpatient setting.  Patient has already been treated by primary care with 2 rounds of azithromycin.  Patient was then treated from our office with Levaquin as well as another course of prednisone.  This is the third round of prednisone the patient is received.  He had a similar episode back in March/2020.  At that point time patient had blood work that revealed peripheral eosinophilia around 600.  Patient continues to be maintained on Symbicort 160 and Spiriva Respimat 2.5.  He is in the process of transitioning to [facility] 200 as [facility] been working with him on making his medications more affordable.  Patient continues to have worsening symptoms after mowing the yard or being outside.  Overall, the patient feels that he is doing much better after being treated telephonically.  He still feels like he is aspects of congestion that he cannot get out after clearing his throat.  He does have a flutter valve at home but he admits he has not been using it regularly.    Tests:   06/09/2014 pulmonary function test> ratio 51%, FEV1 1.47 L (40% predicted), total lung capacity 5.43 L (71% predicted). DLCO 16.26 (44% predicted)  December 2017 lung function testing showed  ratio 50%, FEV1 1.47 L 43% predicted, FVC 2.96 L 67% predicted  March 2020 pulmonary function testing ratio 38%, FEV1 0.97 L 31% predicted, total lung capacity 8.44 L 119% predicted, residual volume 198% predicted, DLCO 14.0 55% predicted  March 2020 high-resolution CT scan of the chest images : Centrilobular emphysema, no interstitial lung disease, aortic atherosclerosis  04/20/2018-eosinophils relative 7.3, eosinophils absolute 0.6 04/20/2018-IgE-67  FENO:  No results found for: NITRICOXIDE  PFT: PFT Results Latest Ref Rng & Units 04/20/2018 06/07/2014  FVC-Pre L 2.56 2.80  FVC-Predicted Pre % 60 57  FVC-Post L 2.60 2.90  FVC-Predicted Post % 61 59  Pre FEV1/FVC % % 38 52  Post FEV1/FCV % % 39 51  FEV1-Pre L 0.97 1.46  FEV1-Predicted Pre % 31 40  FEV1-Post L 1.02 1.47  DLCO UNC% % 55 44  DLCO COR %Predicted % 68 55  TLC L 8.44 -  TLC % Predicted % 119 -  RV % Predicted % 198 -    Imaging: No results found.    Specialty Problems      Pulmonary Problems   Centrilobular Emphysema    Noted on 04/2014 CXR 06/09/2014 pulmonary function test> ratio 51%, FEV1 1.47 L (40% predicted), total lung capacity 5.43 L (71% predicted). DLCO 16.26 (44% predicted)      COPD with acute exacerbation (HCC)   Diffusion capacity of lung (dl), decreased      Allergies  Allergen Reactions  . Penicillins Rash    Has patient had  a PCN reaction causing immediate rash, facial/tongue/throat swelling, SOB or lightheadedness with hypotension:Yes Has patient had a PCN reaction causing severe rash involving mucus membranes or skin necrosis:Yes Has patient had a PCN reaction that required hospitalization:No Has patient had a PCN reaction occurring within the last 10 years:No If all of the above answers are "NO", then may proceed with Cephalosporin use.     Immunization History  Administered Date(s) Administered  . Influenza Inj Mdck Quad Pf 10/28/2017  . Influenza Split 11/24/2013  .  Influenza, High Dose Seasonal PF 11/05/2016  . Influenza,inj,Quad PF,6+ Mos 11/08/2014, 11/26/2015  . Influenza-Unspecified 10/11/2013, 10/11/2013  . Pneumococcal Conjugate-13 04/18/2014  . Pneumococcal Polysaccharide-23 11/26/2015  . Tdap 02/13/2011  . Zoster 11/07/2010  . Zoster Recombinat (Shingrix) 12/01/2016, 02/20/2017    Past Medical History:  Diagnosis Date  . Allergy   . Calf pain   . COPD (chronic obstructive pulmonary disease) (HCC)    no Oxygen per pt  . Dyspnea   . Hypokalemia   . Internal hemorrhoids   . Muscle cramp   . Muscle pain   . Pneumonia   . PONV (postoperative nausea and vomiting)   . Tubular adenoma of colon   . Ulcerative colitis ([place])     Tobacco History: Social History   Tobacco Use  Smoking Status Former Smoker  . Packs/day: 0.75  . Years: 35.00  . Pack years: 26.25  . Types: Cigarettes, Cigars  . Quit date: 06/11/2011  . Years since quitting: 7.2  Smokeless Tobacco Never Used  Tobacco Comment   smoking cessation materials not required   Counseling given: Yes Comment: smoking cessation materials not required  Continue to not smoke  Outpatient Encounter Medications as of 09/20/2018  Medication Sig  . albuterol (PROVENTIL) (2.5 MG/3ML) 0.083% nebulizer solution Take 3 mLs (2.5 mg total) by nebulization every 6 (six) hours as needed for wheezing or shortness of breath.  Marland Kitchen albuterol (VENTOLIN HFA) 108 (90 Base) MCG/ACT inhaler INHALE 2 PUFFS INTO THE LUNGS EVERY 6 HOURS AS NEEDED FOR WHEEZING OR SHORTNESS OF BREATH  . Ascorbic Acid (VITAMIN C PO) Take by mouth.  Marland Kitchen azithromycin (ZITHROMAX) 250 MG tablet 2 today then 1 a day for 4 days  . budesonide-formoterol (SYMBICORT) 160-4.5 MCG/ACT inhaler Inhale 2 puffs into the lungs every 12 (twelve) hours.  . cetirizine (ZYRTEC) 10 MG tablet Take 10 mg by mouth daily.  Marland Kitchen EPINEPHrine 0.3 mg/0.3 mL IJ SOAJ injection Inject 0.3 mLs (0.3 mg total) into the muscle once.  Marland Kitchen guaiFENesin-codeine  (ROBITUSSIN AC) 100-10 MG/5ML syrup Take 5 mLs by mouth 3 (three) times daily as needed for cough.  Marland Kitchen ipratropium-albuterol (DUONEB) 0.5-2.5 (3) MG/3ML SOLN USE 3 ML VIA NEBULIZER EVERY 6 HOURS (Patient taking differently: 3 mLs every 6 (six) hours as needed. USE 3 ML VIA NEBULIZER EVERY 6 HOURS)  . levofloxacin (LEVAQUIN) 500 MG tablet Take 1 tablet (500 mg total) by mouth daily.  Marland Kitchen loperamide (IMODIUM) 2 MG capsule Take 2 mg by mouth daily as needed for diarrhea or loose stools.   Marland Kitchen MAGNESIUM PO Take by mouth.  . meloxicam (MOBIC) 15 MG tablet Take 15 mg by mouth daily.  . mesalamine (LIALDA) 1.2 g EC tablet TAKE 4 TABLETS BY MOUTH DAILY WITH BREAKFAST  . Multiple Vitamins-Minerals (ZINC PO) Take by mouth.  . predniSONE (DELTASONE) 10 MG tablet Take 4 tablets for 2 days, 3 tablets for 2 days, 2 tablets for 2 days, 1 tablet for 2 days then stop  .  Tiotropium Bromide Monohydrate (SPIRIVA RESPIMAT) 2.5 MCG/ACT AERS Inhale 2 puffs into the lungs daily.  Marland Kitchen VITAMIN D PO Take by mouth.  . [DISCONTINUED] predniSONE (DELTASONE) 10 MG tablet Taper 4,4,4,3,3,3,2,2,2,1,1,1  . mometasone-formoterol (DULERA) 200-5 MCG/ACT AERO Inhale 2 puffs into the lungs 2 (two) times daily. (Patient not taking: Reported on 09/20/2018)   Facility-Administered Encounter Medications as of 09/20/2018  Medication  . 0.9 %  sodium chloride infusion     Review of Systems  Review of Systems  Constitutional: Negative for activity change, chills, fatigue, fever and unexpected weight change.  HENT: Positive for congestion, postnasal drip and rhinorrhea. Negative for sinus pressure, sinus pain and sore throat.   Eyes: Negative.   Respiratory: Positive for shortness of breath. Negative for cough and wheezing.   Cardiovascular: Negative for chest pain and palpitations.  Gastrointestinal: Negative for diarrhea, nausea and vomiting.  Endocrine: Negative.   Genitourinary: Negative.   Musculoskeletal: Negative.   Skin: Negative.    Neurological: Negative for dizziness and headaches.  Psychiatric/Behavioral: Negative.  Negative for dysphoric mood. The patient is not nervous/anxious.   All other systems reviewed and are negative.    Physical Exam  BP 124/76 (BP Location: Right Arm, Patient Position: Sitting, Cuff Size: Normal)   Pulse 94   Temp 98.1 F (36.7 C)   Ht 5' 11"  (1.803 m)   Wt 152 lb 12.8 oz (69.3 kg)   SpO2 97%   BMI 21.31 kg/m   Wt Readings from Last 5 Encounters:  09/20/18 152 lb 12.8 oz (69.3 kg)  09/02/18 151 lb (68.5 kg)  08/23/18 150 lb (68 kg)  08/17/18 154 lb (69.9 kg)  08/09/18 150 lb (68 kg)     Physical Exam Vitals signs and nursing note reviewed.  Constitutional:      General: He is not in acute distress.    Appearance: Normal appearance.  HENT:     Head: Normocephalic and atraumatic.     Right Ear: Hearing, tympanic membrane, ear canal and external ear normal.     Left Ear: Hearing, tympanic membrane, ear canal and external ear normal.     Nose: Rhinorrhea present. No mucosal edema.     Right Turbinates: Not enlarged.     Left Turbinates: Not enlarged.     Mouth/Throat:     Mouth: Mucous membranes are dry.     Pharynx: Oropharynx is clear. No oropharyngeal exudate.  Eyes:     Pupils: Pupils are equal, round, and reactive to light.  Neck:     Musculoskeletal: Normal range of motion.  Cardiovascular:     Rate and Rhythm: Normal rate and regular rhythm.     Pulses: Normal pulses.     Heart sounds: Normal heart sounds. No murmur.  Pulmonary:     Effort: Pulmonary effort is normal. No respiratory distress.     Breath sounds: Normal breath sounds. No decreased breath sounds, wheezing or rales.  Abdominal:     General: Abdomen is flat. Bowel sounds are normal. There is no distension.     Palpations: Abdomen is soft.     Tenderness: There is no abdominal tenderness.  Musculoskeletal:     Right lower leg: No edema.     Left lower leg: No edema.  Lymphadenopathy:      Cervical: No cervical adenopathy.  Skin:    General: Skin is warm and dry.     Capillary Refill: Capillary refill takes less than 2 seconds.     Findings: No erythema or  rash.  Neurological:     General: No focal deficit present.     Mental Status: He is alert and oriented to person, place, and time.     Motor: No weakness.     Coordination: Coordination normal.     Gait: Gait is intact. Gait normal.  Psychiatric:        Mood and Affect: Mood normal.        Behavior: Behavior normal. Behavior is cooperative.        Thought Content: Thought content normal.        Judgment: Judgment normal.      Lab Results:  CBC    Component Value Date/Time   WBC 8.6 04/20/2018 1551   RBC 4.71 04/20/2018 1551   HGB 14.9 04/20/2018 1551   HGB 15.2 11/26/2015 0933   HCT 44.0 04/20/2018 1551   HCT 43.9 11/26/2015 0933   PLT 218.0 04/20/2018 1551   PLT 247 11/26/2015 0933   MCV 93.4 04/20/2018 1551   MCV 90 11/26/2015 0933   MCV 95 10/01/2013 2131   MCH 31.0 04/28/2016 1433   MCHC 33.9 04/20/2018 1551   RDW 12.6 04/20/2018 1551   RDW 13.6 11/26/2015 0933   RDW 12.4 10/01/2013 2131   LYMPHSABS 2.0 04/20/2018 1551   MONOABS 1.3 (H) 04/20/2018 1551   EOSABS 0.6 04/20/2018 1551   BASOSABS 0.1 04/20/2018 1551    BMET    Component Value Date/Time   NA 144 12/01/2016 0925   NA 138 10/01/2013 2131   K 5.0 12/01/2016 0925   K 4.2 10/01/2013 2131   CL 103 12/01/2016 0925   CL 107 10/01/2013 2131   CO2 24 12/01/2016 0925   CO2 23 10/01/2013 2131   GLUCOSE 85 12/01/2016 0925   GLUCOSE 72 05/28/2016 1450   GLUCOSE 118 (H) 10/01/2013 2131   BUN 11 12/01/2016 0925   BUN 11 10/01/2013 2131   CREATININE 1.03 12/01/2016 0925   CREATININE 0.93 10/01/2013 2131   CALCIUM 9.5 12/01/2016 0925   CALCIUM 8.7 10/01/2013 2131   GFRNONAA 72 12/01/2016 0925   GFRNONAA >60 10/01/2013 2131   GFRAA 83 12/01/2016 0925   GFRAA >60 10/01/2013 2131    BNP No results found for: BNP  ProBNP No  results found for: PROBNP    Assessment & Plan:   Centrilobular Emphysema Plan: Continue Symbicort 160 Continue Spiriva Respimat 2.5 Continue duo nebs as needed Establish with Dr. 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 Vaughan Browner in 6 to 8 weeks Start paperwork for Dupixent  Increased eosinophils in the blood Plan: We will start paperwork for Dupixent  Medication management Plan: [facility] team for management of medications Transition to [employer] 200 if approved and accepted into their pharmaceutical program, this would replace Symbicort 160    Return in about 2 months (around 11/20/2018), or if symptoms worsen or fail to improve, for Follow up with Dr. Vaughan Browner.   Lauraine Rinne, NP 09/20/2018   This appointment was 28 minutes long with over 50% of the time in direct face-to-face patient care, assessment, plan of care, and follow-up.

## 2018-09-20 ENCOUNTER — Other Ambulatory Visit: Payer: Self-pay

## 2018-09-20 ENCOUNTER — Encounter: Payer: Self-pay | Admitting: Pulmonary Disease

## 2018-09-20 ENCOUNTER — Ambulatory Visit: Payer: PPO | Admitting: Pulmonary Disease

## 2018-09-20 DIAGNOSIS — J449 Chronic obstructive pulmonary disease, unspecified: Secondary | ICD-10-CM | POA: Insufficient documentation

## 2018-09-20 DIAGNOSIS — D721 Eosinophilia, unspecified: Secondary | ICD-10-CM

## 2018-09-20 DIAGNOSIS — Z79899 Other long term (current) drug therapy: Secondary | ICD-10-CM | POA: Diagnosis not present

## 2018-09-20 DIAGNOSIS — J439 Emphysema, unspecified: Secondary | ICD-10-CM

## 2018-09-20 DIAGNOSIS — J441 Chronic obstructive pulmonary disease with (acute) exacerbation: Secondary | ICD-10-CM

## 2018-09-20 NOTE — Patient Instructions (Addendum)
We will start paperwork today for Dupixent  Continue Symbicort160 >>> 2 puffs in the morning right when you wake up, rinse out your mouth after use, 12 hours later 2 puffs, rinse after use >>> Take this daily, no matter what >>> This is not a rescue inhaler  We will [facility] to update them that he still have not received Dulera  ContinueSpiriva Respimat 2.5 >>> 2 puffs daily >>> Do this every day >>>This is not a rescue inhaler  Can use DuoNeb nebulized medications every to 8 hours as needed for shortness of breath and wheezing    Note your daily symptoms >remember "red flags" for COPD:  >>>Increase in cough >>>increase in sputum production >>>increase in shortness of breath or activity intolerance.   If you notice these symptoms, please call the office to be seen.   Continue Zyrtec daily  Continue Flonase as needed for nasal congestion / allergy symptoms   Start nasal saline rinses after yard work  Attempt to wear a mask when doing yard work   Return in about 2 months (around 11/20/2018), or if symptoms worsen or fail to improve, for Follow up with Dr. Vaughan Browner.   Coronavirus (COVID-19) Are you at risk?  Are you at risk for the Coronavirus (COVID-19)?  To be considered HIGH RISK for Coronavirus (COVID-19), you have to meet the following criteria:  . Traveled to [place], [place], [place], [place] or [place]; or in the [place] to [place], [place], [place], or [place]; and have fever, cough, and shortness of breath within the last 2 weeks of travel OR . Been in close contact with a person diagnosed with COVID-19 within the last 2 weeks and have fever, cough, and shortness of breath . IF YOU DO NOT MEET THESE CRITERIA, YOU ARE CONSIDERED LOW RISK FOR COVID-19.  What to do if you are HIGH RISK for COVID-19?  Marland Kitchen If you are having a medical emergency, call 911. . Seek medical care right away. Before you go to a doctor's  office, urgent care or emergency department, call ahead and tell them about your recent travel, contact with someone diagnosed with COVID-19, and your symptoms. You should receive instructions from your physician's office regarding next steps of care.  . When you arrive at healthcare provider, tell the healthcare staff immediately you have returned from visiting [place], [place], [place], [place] or [place]; or traveled in the [place] to [place], [place], [place], or [place]; in the last two weeks or you have been in close contact with a person diagnosed with COVID-19 in the last 2 weeks.   . Tell the health care staff about your symptoms: fever, cough and shortness of breath. . After you have been seen by a medical provider, you will be either: o Tested for (COVID-19) and discharged home on quarantine except to seek medical care if symptoms worsen, and asked to  - Stay home and avoid contact with others until you get your results (4-5 days)  - Avoid travel on public transportation if possible (such as bus, train, or airplane) or o Sent to the Emergency Department by EMS for evaluation, COVID-19 testing, and possible admission depending on your condition and test results.  What to do if you are LOW RISK for COVID-19?  Reduce your risk of any infection by using the same precautions used for avoiding the common cold or flu:  Marland Kitchen Wash your hands often with soap and warm water for  at least 20 seconds.  If soap and water are not readily available, use an alcohol-based hand sanitizer with at least 60% alcohol.  . If coughing or sneezing, cover your mouth and nose by coughing or sneezing into the elbow areas of your shirt or coat, into a tissue or into your sleeve (not your hands). . Avoid shaking hands with others and consider head nods or verbal greetings only. . Avoid touching your eyes, nose, or mouth with unwashed hands.  . Avoid close contact with people who are sick. . Avoid places or  events with large numbers of people in one location, like concerts or sporting events. . Carefully consider travel plans you have or are making. . If you are planning any travel outside or inside the [place], visit the CDC's Travelers' Health webpage for the latest health notices. . If you have some symptoms but not all symptoms, continue to monitor at home and seek medical attention if your symptoms worsen. . If you are having a medical emergency, call 911.   [place] / e-Visit: [email or web address]         [facility]: [facility]: [phone]                   [facility]: [phone]           It is flu season:   >>> Best ways to protect herself from the flu: Receive the yearly flu vaccine, practice good hand hygiene washing with soap and also using hand sanitizer when available, eat a nutritious meals, get adequate rest, hydrate appropriately   Please contact the office if your symptoms worsen or you have concerns that you are not improving.   Thank you for choosing [facility] for your healthcare, and for allowing [place] to partner with you on your healthcare journey. I am thankful to be able to provide care to you today.   [name] FNP-C

## 2018-09-20 NOTE — Assessment & Plan Note (Signed)
Plan: We will start paperwork for Dupixent

## 2018-09-20 NOTE — Assessment & Plan Note (Signed)
Plan: Continue to [facility] team for management of medications Transition to [facility] 200 if approved and accepted into their pharmaceutical program, this would replace Symbicort 160

## 2018-09-20 NOTE — Assessment & Plan Note (Signed)
Plan: Continue Symbicort 160 Continue Spiriva Respimat 2.5 Continue duo nebs as needed Establish with Dr. Vaughan Browner in 6 to 8 weeks in our office We will start paperwork today for [place]

## 2018-09-20 NOTE — Assessment & Plan Note (Signed)
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 Vaughan Browner in 6 to 8 weeks Start paperwork for [place]

## 2018-09-22 ENCOUNTER — Telehealth: Payer: Self-pay

## 2018-09-22 NOTE — Telephone Encounter (Signed)
Faxed application for Dupixent and forms placed in injection room.  Nothing further needed

## 2018-09-23 ENCOUNTER — Ambulatory Visit: Payer: PPO | Admitting: Pulmonary Disease

## 2018-09-25 NOTE — Progress Notes (Signed)
Reviewed, agree 

## 2018-09-28 ENCOUNTER — Telehealth: Payer: Self-pay | Admitting: Pulmonary Disease

## 2018-09-28 ENCOUNTER — Other Ambulatory Visit: Payer: Self-pay | Admitting: Pharmacy Technician

## 2018-09-28 NOTE — Patient Outreach (Signed)
[facility]) Care Management  09/28/2018  Dorse Locy 11-Dec-1945 [id number]   Follow up call placed to [employer] regarding patient assistance application(s) for [facility] and Proventil HFA , Ralph Leyden confirms patient has been approved as of 8/08 until 02/10/19. Medication to arrive at patient's home in 14-21 business days.  Follow up:  Will follow up with patient in 14-21 business days to confirm medication has been received.  [name] [name] [place] Certified Pharmacy Technician [place] Management Direct Dial:[phone]

## 2018-09-28 NOTE — Telephone Encounter (Signed)
Called [facility], [phone], spoke with Elnita Maxwell.  Diagnosis code J45.901, chronic obstructive asthma given.  Kirsten stated diagnosis code was added to Patient information.  Nothing further needed at this time.

## 2018-09-28 NOTE — Telephone Encounter (Signed)
Faxed received today from [place] [employer] reimbursement program requesting diagnosis code.  Called Dupixent My way, [phone], spoke with Rodena Piety.  Rodena Piety stated diagnosis code used, J44.9,(COPD) is not listed for [place].  D72.1 (elevated esinophils)  is not listed for Dupixent.  Diagnosis code Rodena Piety stated diagnosis code had to be asthma related, J45. Code.  Message routed to Aaron Edelman, NP to advise on diagnosis code  Pt reference # [id number]

## 2018-09-28 NOTE — Telephone Encounter (Signed)
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

## 2018-09-29 NOTE — Telephone Encounter (Signed)
Returned call to [place], [phone], spoke with Marlou Sa. Marlou Sa stated Pharmacy was requesting additional information. Call was transferred to [facility], Pharmacy.   Dupixent PA initiated with Leanne.  Leanne stated PA met criteria.  Leanne stated Dupixent PA will be approved.  Leanne stated [place] PA approval fax should be received in 24 hours and after fax received, [facility] can be contacted. Ref # [id number]  Medication name and strength: Dupixent  Provider: Tyler Aas  Pharmacy: [facility]  Patient insurance ID: [id number]  Phone: [phone]  Fax: [phone]  Was the PA started on CMM?  No If yes, please enter the Key: n/a Timeframe for approval/denial: approval fax will be received in 24hrs

## 2018-09-29 NOTE — Telephone Encounter (Signed)
[facility] calling back to speak to nurse  Concerning PA [phone] opt 3 ref # [id number].Hillery Hunter

## 2018-09-29 NOTE — Telephone Encounter (Signed)
Received summary of benefits from [place] [employer].    Medication name and strength: Dupixent  Provider: Tyler Aas Pharmacy: [facility] Patient insurance ID: [id number] Phone: [phone] Fax: [phone]  Spoke with Benjamine Sprague.  Otila Kluver stated she would create and fax form to start [place] PA.

## 2018-10-01 ENCOUNTER — Telehealth: Payer: Self-pay | Admitting: Pulmonary Disease

## 2018-10-01 NOTE — Telephone Encounter (Signed)
[place] and spoke with Pharmacist, Joellen Jersey .  Diagnosis code was needed.  Diagnosis code J45.902 and D72.1 used.  Pharmacist wanted to verify patient allergy.  Patient allergy  PCN given.  Pharmacy needed to verify office delivery address. [facility] address given.  Verbal prescription for Dupixent given with loading dose and refills.   Nothing further at this time. Will route to injection pool to follow up

## 2018-10-04 ENCOUNTER — Other Ambulatory Visit: Payer: Self-pay | Admitting: Pharmacy Technician

## 2018-10-04 NOTE — Patient Outreach (Signed)
[facility]) Care Management  10/04/2018  Edyn Qazi [birth year] [id number]   Incoming call from patient stating he had missed calls and messages left about Dupixent. Informed him that we are not working on that medication for him and that he would need to contact [facility] in regards to it.  Patient confirms that he has received the Lialda and Spiriva Respimat from companies but has not received the [facility] and Proventil HFA. Informed patient that Dulera and Proventil HFA should arrive in the next week or so per companies shipping process.  [name] [name] [place] Certified Pharmacy Technician [place] Management Direct Dial:[phone]

## 2018-10-05 NOTE — Telephone Encounter (Signed)
Called and spoke with Patient about [place] approval.  Patient stated he had been working with [place] [facility], Rolan Lipa, RN,about getting medication through them.  Patient stated through [facility], cost for injection was over $1000. Patient gave Rolan Lipa, RN contact [phone], option 1, then option 9, ext [phone].    Called [facility], spoke with Rolan Lipa, Therapist, sports.  Johnnie stated Patient assistance was received.  Johnnie stated verbal was received, stating patient PA was approved for [place].  Johnnie stated nothing further was needed.  Johnnie stated Patient assistance approval outcome should be received in a few days.  Johnnie stated [facility] and Patient will be contacted once outcome has been received.  Waiting on pt assist outcome.

## 2018-10-05 NOTE — Telephone Encounter (Signed)
Fax received from [facility].  Patient has been approved for Dupixent 368m/2ml syringe through 02/10/19.

## 2018-10-08 NOTE — Telephone Encounter (Signed)
Called [employer] my way to follow up on Patient assistance approval.  Left message for Johnnie, RN to call back when she is available.

## 2018-10-08 NOTE — Telephone Encounter (Signed)
If patient tolerates Dupixent injections in our office for 3 injections and I believe this would be reasonable for him to then eventually be transition to home injections.  I would prefer and I recommendations would be that he still receives 3 injections in office under our supervision.  I would also recommend that his daughter who is a Librarian, academic could come to 1 or 2 of those to observe the injection being given.  I am quite aware than of sure she is pretty experienced in capable of giving the injections.  Really more concerned about monitoring for reaction.[name] FNP

## 2018-10-08 NOTE — Telephone Encounter (Signed)
Called and spoke with Patient.  Antonio [name] recommendations given.  Patient stated he would call office once medication arrives, to schedule injection appointment.

## 2018-10-08 NOTE — Telephone Encounter (Signed)
Patient is requesting to have [place] delivered delivered to his home for his Daughter, a Surveyor, minerals, to administer as directed, every 2 weeks.  Message routed to Aaron Edelman, NP, to advise if he is ok with Patient receiving injection from Daughter

## 2018-10-08 NOTE — Telephone Encounter (Signed)
Received call from [place], [place], [place] [facility].  Antonio [name] stated Patient was approved for Patient assistance through 01/2019.  Antonio [name] stated Dupixent was scheduled to be delivered 10/13/18 at Patient's home address. Called and spoke with Patient. Patient stated he had spoken to [place], and he stated his Daughter was a PA and requested to have her do his injections at home.

## 2018-10-11 ENCOUNTER — Other Ambulatory Visit: Payer: Self-pay | Admitting: Pharmacy Technician

## 2018-10-11 NOTE — Patient Outreach (Addendum)
[place] [facility]) Care Management  10/11/2018  Reyhan Moronta 1945-12-11 [id number]    Follow up call placed to [employer] regarding patient assistance shipping details for [facility] and Proventil HFA, Sharyn Lull states that medications were delivered to patients home on 8/25 @ 10:51am.   Unsuccessful call placed to patient regarding patient assistance medication delivery of Dulera and Proventil HFA, HIPAA compliant voicemail left with patients wife.   Incoming call from patient regarding patient assistance medication delivery of Dulera and Proventil HFA, HIPAA identifiers verified. Mr. Delfavero confirms that he received medications from [place]. Reviewed with patient on how to obtain refills from [facility] & Proventil HFA, Spiriva Respimat (B-I) and Lialda ([employer]). Requested that he contact me with any issues.  Follow up:  Will route note to [place] for case closure  [name]. [name] [place] Certified Pharmacy Technician [place] Management Direct [phone]

## 2018-10-11 NOTE — Telephone Encounter (Signed)
No injection appt scheduled as of today (following Friday's note).  Will close encounter since patient did receive recommendations from provider regarding injections.

## 2018-10-12 ENCOUNTER — Other Ambulatory Visit: Payer: Self-pay | Admitting: Pharmacist

## 2018-10-12 NOTE — Patient Outreach (Signed)
[place] [facility]) Care Management [place]  10/12/2018  Dquan Cortopassi 28-Nov-1945 [id number]  Patient has been approved for Dulera, Proventil, Spiriva, and Lialda patient assistance program(s).  Patient has been instructed on how to order refills and renewal process for 2020.    Plan: Bells case is being closed due to the following reasons: -Goals of care have been met. -I have provided my contact information if patient or family needs to reach out to me in the future.  -Thank you for allowing [facility] pharmacy to be involved in this patient's care.    [name], PharmD, [place] [phone]

## 2018-10-13 ENCOUNTER — Telehealth: Payer: Self-pay | Admitting: Pulmonary Disease

## 2018-10-13 DIAGNOSIS — Z91013 Allergy to seafood: Secondary | ICD-10-CM

## 2018-10-13 MED ORDER — EPINEPHRINE 0.3 MG/0.3ML IJ SOAJ: 0.3000 mg | Freq: Once | INTRAMUSCULAR | 5 refills | Status: AC

## 2018-10-13 NOTE — Telephone Encounter (Signed)
Spoke with pt. He has been scheduled for his 3 dupixent injections. Pt was made aware of our office policy for injections >> 2 hour wait, Epipen. Rx for Epipen has been sent in. Nothing further was needed.

## 2018-10-25 ENCOUNTER — Ambulatory Visit (INDEPENDENT_AMBULATORY_CARE_PROVIDER_SITE_OTHER): Payer: PPO

## 2018-10-25 ENCOUNTER — Other Ambulatory Visit: Payer: Self-pay

## 2018-10-25 DIAGNOSIS — J4550 Severe persistent asthma, uncomplicated: Secondary | ICD-10-CM | POA: Diagnosis not present

## 2018-10-25 MED ORDER — DUPILUMAB 300 MG/2ML ~~LOC~~ SOSY
600.0000 mg | PREFILLED_SYRINGE | Freq: Once | SUBCUTANEOUS | Status: AC
  Administered 2018-10-25: 600 mg via SUBCUTANEOUS

## 2018-10-25 MED ORDER — DUPILUMAB 300 MG/2ML ~~LOC~~ SOSY: 300.0000 mg | PREFILLED_SYRINGE | Freq: Once | SUBCUTANEOUS | Status: DC

## 2018-10-25 NOTE — Progress Notes (Addendum)
Patient presented to the office today for first-time Dupixent injection.  Primary Pulmonologist: [name] NP Medication name: Dupixent Strength: 687m Site(s): L and R Arm  Epi pen/Auvi-Q visible during appointment: Yes  Time of injection: [id number] Patient evaluated every 15-20 minutes per protocol x2 hours.  1st check: 0915 Evaluation: No Reaction  2nd check: 0930  Evaluation: No Reaction  3rd check: 0945 Evaluation: No Reaction  4th check: 1000 Evaluation: No Reaction  5th check: 1015 Evaluation: No Reaction  6th check: 1030 Evaluation: No Reaction  7th check: 1045 Evaluation: No Reaction

## 2018-10-31 IMAGING — MR MR CERVICAL SPINE W/O CM
4 of 5 series · 26 of 48 positions shown · non-contrast
Comparison: None.

CLINICAL DATA: Numbness in the finger tips of both hands for
several months. No injury.

EXAM:
MRI CERVICAL SPINE WITHOUT CONTRAST
TECHNIQUE: Multiplanar, multisequence MR imaging of the cervical spine was
performed. No intravenous contrast was administered.

[Series 6: T1 · sagittal · 3.0mm · 0.62mm/px · 6 of 15 slices shown]
[im 1/15]
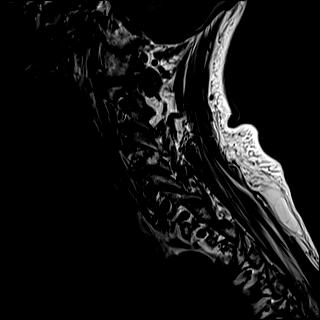
[im 3/15]
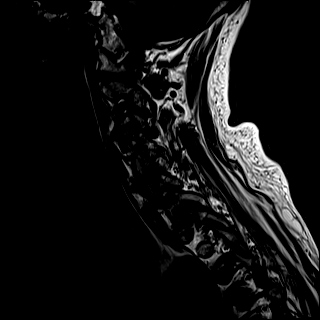
[im 6/15]
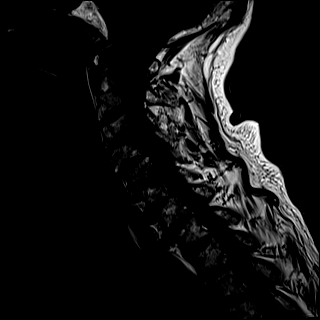
[im 9/15]
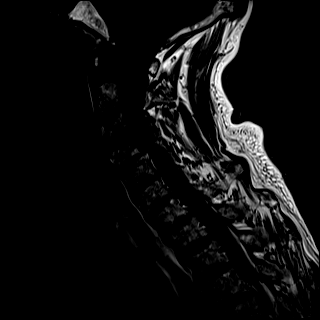
[im 12/15]
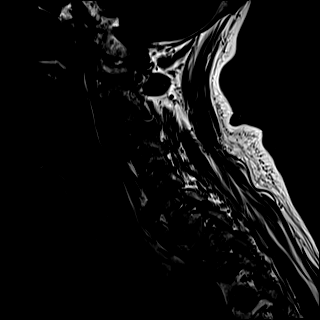
[im 15/15]
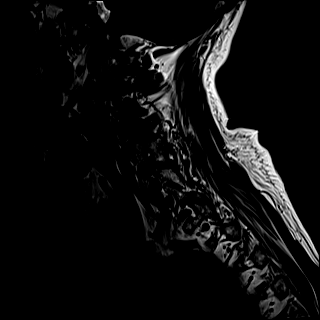

[Series 7: T2 · sagittal · 3.0mm · 0.52mm/px · 7 of 15 slices shown (1 of 2)]
[im 1/15]
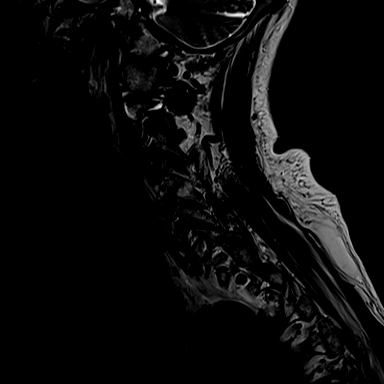
[im 3/15]
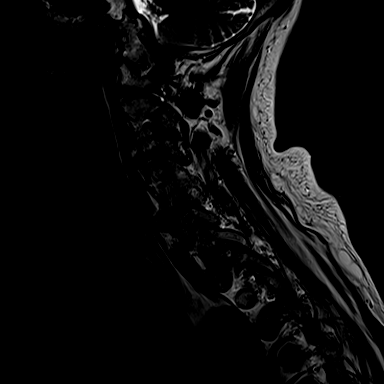
[im 5/15]
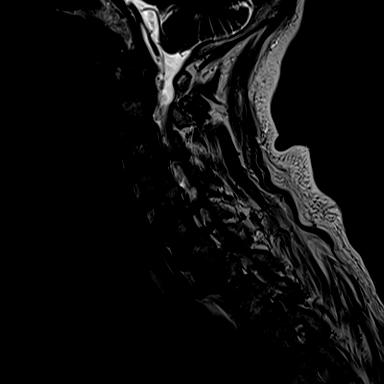
[im 8/15]
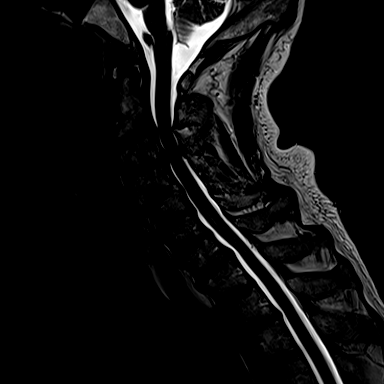
[im 10/15]
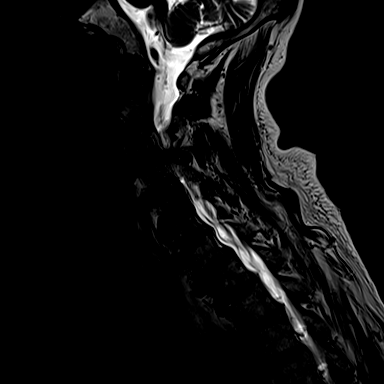
[im 12/15]
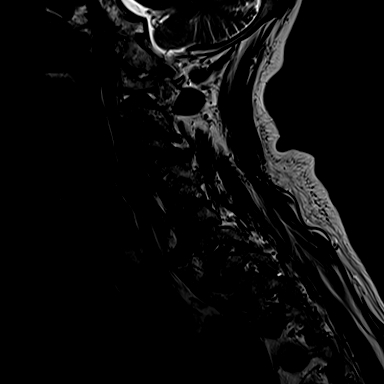
[im 15/15]
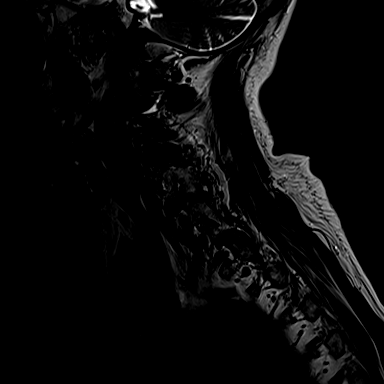

[Series 8: STIR · sagittal · 3.0mm · 0.32mm/px · 5 of 15 slices shown]
[im 1/15]
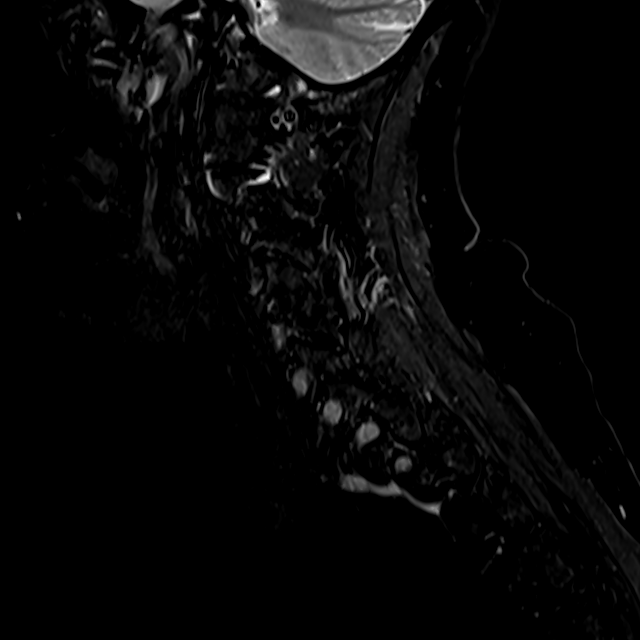
[im 3/15]
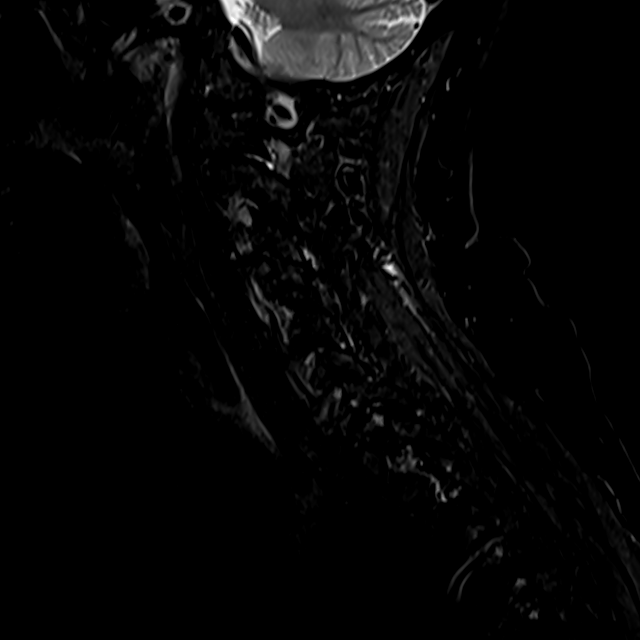
[im 5/15]
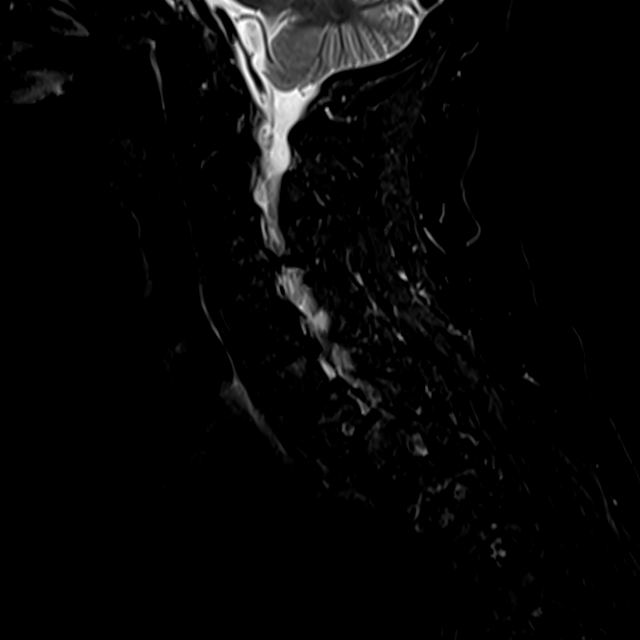
[im 8/15]
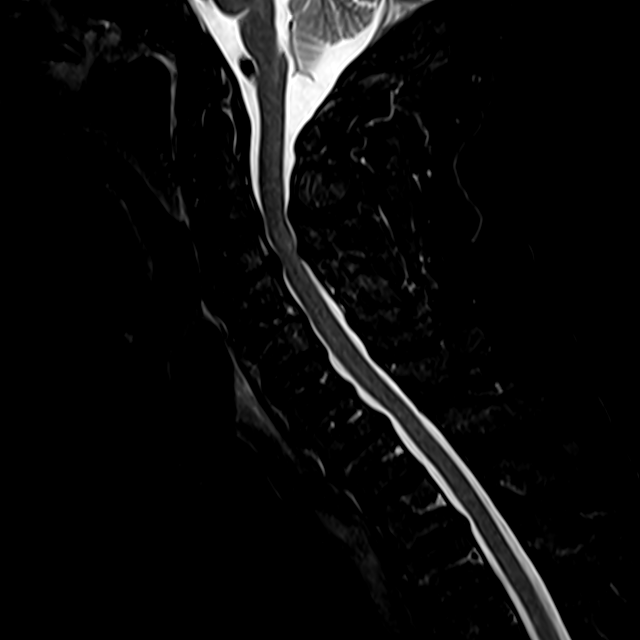
[im 12/15]
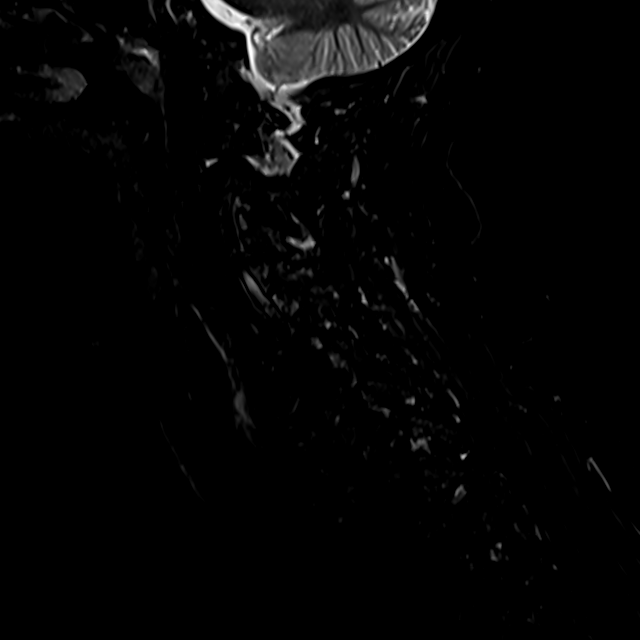

[Series 9: T2 · axial · 3.0mm · 0.50mm/px · z∈[-74,+8]mm · 8 of 30 slices shown (2 of 2)]
[im 1/30]
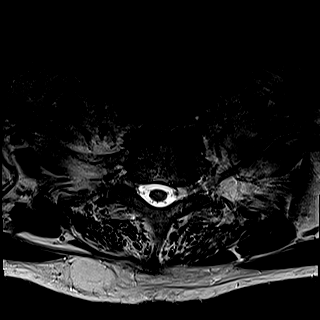
[im 5/30]
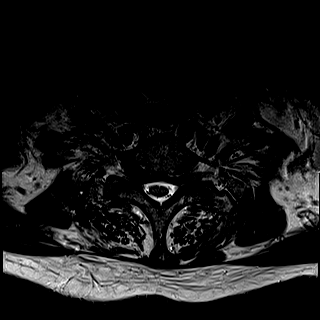
[im 9/30]
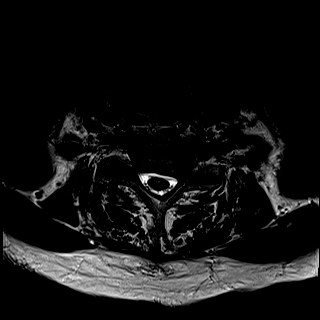
[im 14/30]
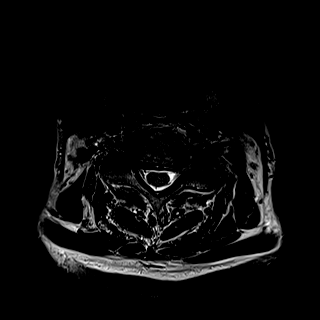
[im 16/30]
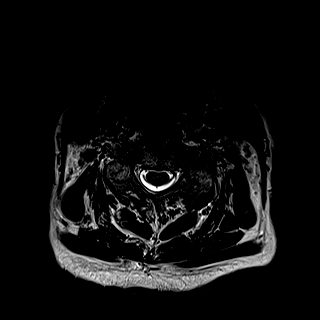
[im 21/30]
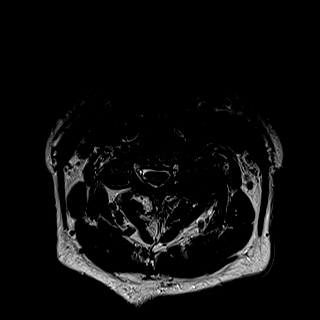
[im 25/30]
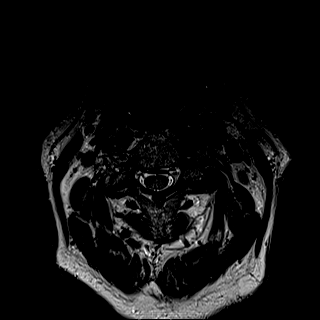
[im 30/30]
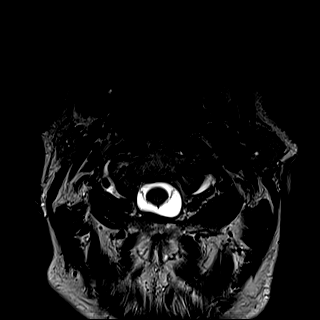

[26 of 48 positions shown; findings below may reference images not displayed]

FINDINGS: Alignment: 2 mm of anterolisthesis C4-5 is facet mediated. 2 mm
anterolisthesis C7-T1 is facet mediated.

Vertebrae: No worrisome osseous lesion. Spontaneous arthrodesis
across the posterior elements of C4-5 related to longstanding joint
space narrowing.

Cord: Severe stenosis at C3-4. Cord compression with abnormal cord
signal, see series 8, image 7 as well as series 9, image 9.

Posterior Fossa, vertebral arteries, paraspinal tissues:
Unremarkable.

Disc levels:

C2-3: Central protrusion. Disc space narrowing. Effacement anterior
subarachnoid space. Facet arthropathy. No definite C3 foraminal
narrowing.

C3-4: Disc space narrowing. Central extrusion. Severe facet
arthropathy with bony overgrowth and joint space narrowing.
Ligamentum flavum hypertrophy. Canal diameter 6-7 mm. Cord
compression with abnormal cord signal. Small dots of gliosis are
seen in the RIGHT and LEFT hemicord on image 9. Severe BILATERAL
foraminal narrowing affecting both C4 nerve roots.

C4-5: Functional fusion across the facets. 2 mm anterolisthesis.
Facet arthropathy with uncinate spurring. BILATERAL C5 foraminal
narrowing. Effacement anterior subarachnoid space but no cord
compression or significant stenosis.

C5-6: Disc space narrowing. Central and rightward protrusion with
osseous spurring. Facet arthropathy. RIGHT greater than LEFT C6
foraminal narrowing.

C6-7: Annular bulge. BILATERAL uncinate spurring, greater on the
LEFT. Either C7 nerve root could be affected.

C7-T1: 2 mm anterolisthesis. Central protrusion. Facet arthropathy.
No impingement.
IMPRESSION: Severe spinal stenosis at C3-4 representing adjacent segment
disease. Central disc extrusion, with ligamentum flavum infolding
and facet arthropathy. Canal diameter 6-7 mm. Cord compression
without abnormal cord signal. Severe BILATERAL foraminal narrowing
affecting both C4 nerve roots.

Functional fusion at C4-5 across the posterior elements. 2 mm
anterolisthesis. Disc space narrowing with bony overgrowth
contributes to BILATERAL C5 foraminal narrowing.

Central and rightward protrusion C5-6 with asymmetric bony
overgrowth on that side contributes to RIGHT greater than LEFT
foraminal narrowing. Similar less severe changes at C6-C7.

## 2018-11-03 DIAGNOSIS — M24542 Contracture, left hand: Secondary | ICD-10-CM | POA: Diagnosis not present

## 2018-11-03 DIAGNOSIS — M24541 Contracture, right hand: Secondary | ICD-10-CM | POA: Diagnosis not present

## 2018-11-05 ENCOUNTER — Telehealth: Payer: Self-pay | Admitting: Pulmonary Disease

## 2018-11-05 NOTE — Telephone Encounter (Signed)
Message routed to injection pool to follow up.

## 2018-11-05 NOTE — Telephone Encounter (Signed)
Called and spoke with Patient.  Patient was concerned about bringing [place] to office and refrigeration.  Per previous instructions from Aaron Edelman, NP, Patient needs to have 3 injections at office, before his Daughter can start administering injections from home.   Patient stated he will bring box with 2 Dupixent syringes to injection and leave box for 3rd and final in office injection appointment.  Nothing further at this time.

## 2018-11-08 ENCOUNTER — Other Ambulatory Visit: Payer: Self-pay

## 2018-11-08 ENCOUNTER — Ambulatory Visit (INDEPENDENT_AMBULATORY_CARE_PROVIDER_SITE_OTHER): Payer: PPO

## 2018-11-08 DIAGNOSIS — J4550 Severe persistent asthma, uncomplicated: Secondary | ICD-10-CM

## 2018-11-08 MED ORDER — DUPILUMAB 300 MG/2ML ~~LOC~~ SOSY
300.0000 mg | PREFILLED_SYRINGE | Freq: Once | SUBCUTANEOUS | Status: AC
  Administered 2018-11-08: 300 mg via SUBCUTANEOUS

## 2018-11-08 NOTE — Progress Notes (Signed)
All questions were answered by the patient before medication was administered. Have you been hospitalized in the last 10 days? No Do you have a fever? No Do you have a cough? No Do you have a headache or sore throat? No  

## 2018-11-22 ENCOUNTER — Ambulatory Visit (INDEPENDENT_AMBULATORY_CARE_PROVIDER_SITE_OTHER): Payer: PPO

## 2018-11-22 ENCOUNTER — Other Ambulatory Visit: Payer: Self-pay

## 2018-11-22 DIAGNOSIS — J4550 Severe persistent asthma, uncomplicated: Secondary | ICD-10-CM

## 2018-11-22 MED ORDER — DUPILUMAB 300 MG/2ML ~~LOC~~ SOSY
300.0000 mg | PREFILLED_SYRINGE | Freq: Once | SUBCUTANEOUS | Status: AC
  Administered 2018-11-22: 300 mg via SUBCUTANEOUS

## 2018-11-22 NOTE — Progress Notes (Signed)
Have you been hospitalized within the last 10 days?  No Do you have a fever?  No Do you have a cough?  No Do you have a headache or sore throat? No Do you have your Epi Pen visible and is it within date?  Yes 

## 2018-11-23 ENCOUNTER — Encounter: Payer: Self-pay | Admitting: Pulmonary Disease

## 2018-11-23 ENCOUNTER — Ambulatory Visit: Payer: PPO | Admitting: Pulmonary Disease

## 2018-11-23 ENCOUNTER — Ambulatory Visit (INDEPENDENT_AMBULATORY_CARE_PROVIDER_SITE_OTHER): Payer: PPO | Admitting: Pulmonary Disease

## 2018-11-23 ENCOUNTER — Ambulatory Visit: Payer: PPO

## 2018-11-23 VITALS — BP 110/70 | HR 74 | Temp 97.6°F | Ht 71.0 in | Wt 151.0 lb

## 2018-11-23 DIAGNOSIS — D7219 Other eosinophilia: Secondary | ICD-10-CM | POA: Diagnosis not present

## 2018-11-23 DIAGNOSIS — J449 Chronic obstructive pulmonary disease, unspecified: Secondary | ICD-10-CM | POA: Diagnosis not present

## 2018-11-23 DIAGNOSIS — Z79899 Other long term (current) drug therapy: Secondary | ICD-10-CM

## 2018-11-23 NOTE — Progress Notes (Signed)
@Patient  ID: Antonio [name], male    DOB: [birth year], 73 y.o.   MRN: [id number]  Chief Complaint  Patient presents with  . Follow-up    2 month follow up for emphysema. States his SOB gets worse during hot,muggy days. Checks his O2 everyday.     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  26.25 pack years Maintenance: Dulera 200, Spiriva 2.5,  Dupixent  Pt of: Dr. 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  Patient completing a 62-monthfollow-up with our office today.  Unfortunately patient was supposed to originally be establishing care with Dr. MVaughan Brownerbut he was running late in traffic so the appointment was switched to an APP.  Since last office visit patient has started Dupixent injections.  He still feels that his shortness of breath gets worse with hot muggy days, he continues to check his oxygen levels as prescribed.  Eventually he would like to do home Dupixent injections as daughter-in-law is a PA and is willing to provide these injections for the patient.  Patient is already received flu vaccine.  He reports that he feels that his shortness of breath is stable and is actually slightly improved since starting Dupixent.  He is still active and walks his dog twice daily.  He still does have episodes of shortness of breath specifically in hot or humid environment such as after taking a shower.  Patient still using rescue inhaler 2 times daily.  He is maintained on Dulera 200 as well as Spiriva Respimat 2.5.  Patient admits he has had less allergy-like symptoms over the pat 2 months.   Tests:   06/09/2014 pulmonary function test> ratio 51%, FEV1 1.47 L (40% predicted), total lung capacity  5.43 L (71% predicted). DLCO 16.26 (44% predicted)  December 2017 lung function testing showed ratio 50%, FEV1 1.47 L 43% predicted, FVC 2.96 L 67% predicted  March 2020 pulmonary function testing ratio 38%, FEV1 0.97 L 31% predicted, total lung capacity 8.44 L 119% predicted, residual volume 198% predicted, DLCO 14.0 55% predicted  March 2020 high-resolution CT scan of the chest images : Centrilobular emphysema, no interstitial lung disease, aortic atherosclerosis  04/20/2018-eosinophils relative 7.3, eosinophils absolute 0.6 04/20/2018-IgE-67  FENO:  No results found for: NITRICOXIDE  PFT: PFT Results Latest Ref Rng & Units 04/20/2018 06/07/2014  FVC-Pre L 2.56 2.80  FVC-Predicted Pre % 60 57  FVC-Post L 2.60 2.90  FVC-Predicted Post % 61 59  Pre FEV1/FVC % % 38 52  Post FEV1/FCV % % 39 51  FEV1-Pre L 0.97 1.46  FEV1-Predicted Pre % 31 40  FEV1-Post L 1.02 1.47  DLCO UNC% % 55 44  DLCO COR %Predicted % 68 55  TLC L 8.44 -  TLC % Predicted % 119 -  RV % Predicted % 198 -    WALK:  SIX MIN WALK 03/11/2018 04/25/2014  Supplimental Oxygen during Test? (L/min) No No  Tech Comments: patient walked at fast pace; winded not short of breath, patient tolerated well  pt walked a moderately fast pace, tolerated walk well.     Imaging: No results found.  Lab Results:  CBC    Component Value Date/Time   WBC 8.6 04/20/2018 1551   RBC 4.71  04/20/2018 1551   HGB 14.9 04/20/2018 1551   HGB 15.2 11/26/2015 0933   HCT 44.0 04/20/2018 1551   HCT 43.9 11/26/2015 0933   PLT 218.0 04/20/2018 1551   PLT 247 11/26/2015 0933   MCV 93.4 04/20/2018 1551   MCV 90 11/26/2015 0933   MCV 95 10/01/2013 2131   MCH 31.0 04/28/2016 1433   MCHC 33.9 04/20/2018 1551   RDW 12.6 04/20/2018 1551   RDW 13.6 11/26/2015 0933   RDW 12.4 10/01/2013 2131   LYMPHSABS 2.0 04/20/2018 1551   MONOABS 1.3 (H) 04/20/2018 1551   EOSABS 0.6 04/20/2018 1551   BASOSABS 0.1 04/20/2018 1551    BMET    Component  Value Date/Time   NA 144 12/01/2016 0925   NA 138 10/01/2013 2131   K 5.0 12/01/2016 0925   K 4.2 10/01/2013 2131   CL 103 12/01/2016 0925   CL 107 10/01/2013 2131   CO2 24 12/01/2016 0925   CO2 23 10/01/2013 2131   GLUCOSE 85 12/01/2016 0925   GLUCOSE 72 05/28/2016 1450   GLUCOSE 118 (H) 10/01/2013 2131   BUN 11 12/01/2016 0925   BUN 11 10/01/2013 2131   CREATININE 1.03 12/01/2016 0925   CREATININE 0.93 10/01/2013 2131   CALCIUM 9.5 12/01/2016 0925   CALCIUM 8.7 10/01/2013 2131   GFRNONAA 72 12/01/2016 0925   GFRNONAA >60 10/01/2013 2131   GFRAA 83 12/01/2016 0925   GFRAA >60 10/01/2013 2131    BNP No results found for: BNP  ProBNP No results found for: PROBNP  Specialty Problems      Pulmonary Problems   Centrilobular Emphysema    Noted on 04/2014 CXR 06/09/2014 pulmonary function test> ratio 51%, FEV1 1.47 L (40% predicted), total lung capacity 5.43 L (71% predicted). DLCO 16.26 (44% predicted)      COPD with acute exacerbation (HCC)   Diffusion capacity of lung (dl), decreased   Chronic obstructive airway disease (HCC)      Allergies  Allergen Reactions  . Penicillins Rash    Has patient had a PCN reaction causing immediate rash, facial/tongue/throat swelling, SOB or lightheadedness with hypotension:Yes Has patient had a PCN reaction causing severe rash involving mucus membranes or skin necrosis:Yes Has patient had a PCN reaction that required hospitalization:No Has patient had a PCN reaction occurring within the last 10 years:No If all of the above answers are "NO", then may proceed with Cephalosporin use.     Immunization History  Administered Date(s) Administered  . Influenza Inj Mdck Quad Pf 10/28/2017  . Influenza Split 11/24/2013  . Influenza, High Dose Seasonal PF 11/05/2016, 10/29/2017, 10/06/2018  . Influenza,inj,Quad PF,6+ Mos 11/08/2014, 11/26/2015  . Influenza-Unspecified 10/11/2013, 10/11/2013  . Pneumococcal Conjugate-13 04/18/2014  .  Pneumococcal Polysaccharide-23 11/26/2015  . Tdap 02/13/2011  . Zoster 11/07/2010  . Zoster Recombinat (Shingrix) 12/01/2016, 03/02/2017    Past Medical History:  Diagnosis Date  . Allergy   . Calf pain   . COPD (chronic obstructive pulmonary disease) (HCC)    no Oxygen per pt  . Dyspnea   . Hypokalemia   . Internal hemorrhoids   . Muscle cramp   . Muscle pain   . Pneumonia   . PONV (postoperative nausea and vomiting)   . Tubular adenoma of colon   . Ulcerative colitis ([place])     Tobacco History: Social History   Tobacco Use  Smoking Status Former Smoker  . Packs/day: 0.75  . Years: 35.00  . Pack years: 26.25  . Types: Cigarettes,  Cigars  . Quit date: 06/11/2011  . Years since quitting: 7.4  Smokeless Tobacco Never Used  Tobacco Comment   smoking cessation materials not required   Counseling given: Yes Comment: smoking cessation materials not required  Continue to not smoke  Outpatient Encounter Medications as of 11/23/2018  Medication Sig  . albuterol (PROVENTIL) (2.5 MG/3ML) 0.083% nebulizer solution Take 3 mLs (2.5 mg total) by nebulization every 6 (six) hours as needed for wheezing or shortness of breath.  Marland Kitchen albuterol (VENTOLIN HFA) 108 (90 Base) MCG/ACT inhaler INHALE 2 PUFFS INTO THE LUNGS EVERY 6 HOURS AS NEEDED FOR WHEEZING OR SHORTNESS OF BREATH  . Ascorbic Acid (VITAMIN C PO) Take by mouth.  . cetirizine (ZYRTEC) 10 MG tablet Take 10 mg by mouth daily.  Marland Kitchen ipratropium-albuterol (DUONEB) 0.5-2.5 (3) MG/3ML SOLN USE 3 ML VIA NEBULIZER EVERY 6 HOURS (Patient taking differently: 3 mLs every 6 (six) hours as needed. USE 3 ML VIA NEBULIZER EVERY 6 HOURS)  . loperamide (IMODIUM) 2 MG capsule Take 2 mg by mouth daily as needed for diarrhea or loose stools.   Marland Kitchen MAGNESIUM PO Take by mouth.  . mesalamine (LIALDA) 1.2 g EC tablet TAKE 4 TABLETS BY MOUTH DAILY WITH BREAKFAST  . Multiple Vitamins-Minerals (ZINC PO) Take by mouth.  . Tiotropium Bromide Monohydrate  (SPIRIVA RESPIMAT) 2.5 MCG/ACT AERS Inhale 2 puffs into the lungs daily.  Marland Kitchen VITAMIN D PO Take by mouth.  . [DISCONTINUED] budesonide-formoterol (SYMBICORT) 160-4.5 MCG/ACT inhaler Inhale 2 puffs into the lungs every 12 (twelve) hours. (Patient not taking: Reported on 11/23/2018)  . [DISCONTINUED] mometasone-formoterol (DULERA) 200-5 MCG/ACT AERO Inhale 2 puffs into the lungs 2 (two) times daily. (Patient not taking: Reported on 09/20/2018)   Facility-Administered Encounter Medications as of 11/23/2018  Medication  . 0.9 %  sodium chloride infusion     Review of Systems  Review of Systems  Constitutional: Negative for activity change, chills, fatigue, fever and unexpected weight change.  HENT: Negative for congestion, postnasal drip, rhinorrhea, sinus pressure, sinus pain and sore throat.   Eyes: Negative.   Respiratory: Positive for shortness of breath (increased with muggy temps / shower). Negative for cough and wheezing.   Cardiovascular: Negative for chest pain and palpitations.  Gastrointestinal: Negative for diarrhea, nausea and vomiting.  Endocrine: Negative.   Genitourinary: Negative.   Musculoskeletal: Negative.   Skin: Negative.   Neurological: Negative for dizziness and headaches.  Psychiatric/Behavioral: Negative.  Negative for dysphoric mood. The patient is not nervous/anxious.   All other systems reviewed and are negative.    Physical Exam  BP 110/70   Pulse 74   Temp 97.6 F (36.4 C) (Temporal)   Ht 5' 11"  (1.803 m)   Wt 151 lb (68.5 kg)   SpO2 96%   BMI 21.06 kg/m   Wt Readings from Last 5 Encounters:  11/23/18 151 lb (68.5 kg)  09/20/18 152 lb 12.8 oz (69.3 kg)  09/02/18 151 lb (68.5 kg)  08/23/18 150 lb (68 kg)  08/17/18 154 lb (69.9 kg)    BMI Readings from Last 5 Encounters:  11/23/18 21.06 kg/m  09/20/18 21.31 kg/m  09/02/18 21.06 kg/m  08/23/18 20.92 kg/m  08/17/18 21.48 kg/m     Physical Exam Vitals signs and nursing note reviewed.   Constitutional:      General: He is not in acute distress.    Appearance: Normal appearance. He is normal weight.  HENT:     Head: Normocephalic and atraumatic.     Right  Ear: Hearing, tympanic membrane, ear canal and external ear normal. There is no impacted cerumen.     Left Ear: Hearing, tympanic membrane, ear canal and external ear normal. There is no impacted cerumen.     Nose: Nose normal. No mucosal edema, congestion or rhinorrhea.     Right Turbinates: Not enlarged.     Left Turbinates: Not enlarged.     Mouth/Throat:     Mouth: Mucous membranes are moist.     Pharynx: Oropharynx is clear. No oropharyngeal exudate or posterior oropharyngeal erythema.     Comments: Slight postnasal drip Eyes:     Pupils: Pupils are equal, round, and reactive to light.  Neck:     Musculoskeletal: Normal range of motion.  Cardiovascular:     Rate and Rhythm: Normal rate and regular rhythm.     Pulses: Normal pulses.     Heart sounds: Normal heart sounds. No murmur.  Pulmonary:     Effort: Pulmonary effort is normal.     Breath sounds: Normal breath sounds. No decreased breath sounds, wheezing or rales.  Abdominal:     Tenderness: There is no abdominal tenderness.  Musculoskeletal:     Right lower leg: No edema.     Left lower leg: No edema.  Lymphadenopathy:     Cervical: No cervical adenopathy.  Skin:    General: Skin is warm and dry.     Capillary Refill: Capillary refill takes less than 2 seconds.     Findings: No erythema or rash.  Neurological:     General: No focal deficit present.     Mental Status: He is alert and oriented to person, place, and time.     Motor: No weakness.     Coordination: Coordination normal.     Gait: Gait is intact. Gait normal.  Psychiatric:        Mood and Affect: Mood normal.        Behavior: Behavior normal. Behavior is cooperative.        Thought Content: Thought content normal.        Judgment: Judgment normal.       Assessment & Plan:    Centrilobular Emphysema Plan: Continue Dulera 200 Continue Spiriva Respimat 2.5 Continue duo nebs as needed Establish with Dr. Vaughan Browner in 3 months in our office Continue Dupixent  Increase daily physical activity  Increased eosinophils in the blood Plan: Continue Dupixent injections Okay to transition to home injections  Medication management Plan: Continue to work with [facility] pharmacy team for management of medications  Follow-up with [facility] to see what paperwork you need to complete prior to January/2021 to ensure use of access to medications  Continue Dulera 200 Continue Spiriva Respimat 2.5     Return in about 3 months (around 02/23/2019), or if symptoms worsen or fail to improve, for Follow up with Dr. Vaughan Browner - 81mn OV as pt is new to PM, former BQ pt.   BLauraine Rinne NP 11/23/2018   This appointment was 30 minutes long with over 50% of the time in direct face-to-face patient care, assessment, plan of care, and follow-up.

## 2018-11-23 NOTE — Assessment & Plan Note (Signed)
Plan: Continue to work with [facility] pharmacy team for management of medications  Follow-up with [facility] to see what paperwork you need to complete prior to January/2021 to ensure use of access to medications  Continue Dulera 200 Continue Spiriva Respimat 2.5

## 2018-11-23 NOTE — Assessment & Plan Note (Signed)
Plan: Continue Dulera 200 Continue Spiriva Respimat 2.5 Continue duo nebs as needed Establish with Dr. Vaughan Browner in 3 months in our office Continue Dupixent  Increase daily physical activity

## 2018-11-23 NOTE — Progress Notes (Signed)
Reviewed, agree 

## 2018-11-23 NOTE — Assessment & Plan Note (Signed)
Plan: Continue Dupixent injections Okay to transition to home injections

## 2018-11-23 NOTE — Patient Instructions (Addendum)
You were seen today by Lauraine Rinne, NP  for:   1. Centrilobular Emphysema  Dulera 200 >>> 2 puffs in the morning right when you wake up, rinse out your mouth after use, 12 hours later 2 puffs, rinse after use >>> Take this daily, no matter what >>> This is not a rescue inhaler   Spiriva Respimat 2.5 >>> 2 puffs daily >>> Do this every day >>>This is not a rescue inhaler  Only use your albuterol as a rescue medication to be used if you can't catch your breath by resting or doing a relaxed purse lip breathing pattern.  - The less you use it, the better it will work when you need it. - Ok to use up to 2 puffs  every 4 hours if you must but call for immediate appointment if use goes up over your usual need - Don't leave home without it !!  (think of it like the spare tire for your car)   Note your daily symptoms > remember "red flags" for COPD:   >>>Increase in cough >>>increase in sputum production >>>increase in shortness of breath or activity  intolerance.   If you notice these symptoms, please call the office to be seen.   Great job already receiving your flu vaccine  2. Pulmonary emphysema, unspecified emphysema type ([place])   3. Eosinophilic leukocytosis, unspecified type  Continue Dupixent injections  Okay to transition Dupixent injections to home use  Continue Zyrtec if you have worsened allergy-like symptoms  4. Medication management  You have qualified for patient assistance through the help of [facility].  Please understand that these programs and typically every calendar year in December.  Please contact the number listed below in December to see if there is anything else she need to do reduce successfully qualify for these medications in 2021.  [name] [name] [place] Certified Pharmacy Technician [facility] Direct [phone]   Follow Up:    Return in about 3 months (around 02/23/2019), or if symptoms worsen  or fail to improve, for Follow up with Dr. Vaughan Browner - 7mn OV as pt is new to [facility], former [facility] pt.   Please do your part to reduce the spread of COVID-19:      Reduce your risk of any infection  and COVID19 by using the similar precautions used for avoiding the common cold or flu:  .Marland KitchenWash your hands often with soap and warm water for at least 20 seconds.  If soap and water are not readily available, use an alcohol-based hand sanitizer with at least 60% alcohol.  . If coughing or sneezing, cover your mouth and nose by coughing or sneezing into the elbow areas of your shirt or coat, into a tissue or into your sleeve (not your hands). .Langley GaussA MASK when in public  . Avoid shaking hands with others and consider head nods or verbal greetings only. . Avoid touching your eyes, nose, or mouth with unwashed hands.  . Avoid close contact with people who are sick. . Avoid places or events with large numbers of people in one location, like concerts or sporting events. . If you have some symptoms but not all symptoms, continue to monitor at home and seek medical attention if your symptoms worsen. . If you are having a medical emergency, call 911.   ADDITIONAL HEALTHCARE OPTIONS FOR PATIENTS  [facility] / e-Visit: [email or web address]        [facility]: [facility]: [phone]                   [facility]: [phone]     It is flu season:   >>> Best ways to protect herself from the flu: Receive the yearly flu vaccine, practice good hand hygiene washing with soap and also using hand sanitizer when available, eat a nutritious meals, get adequate rest, hydrate appropriately   Please contact the office if your symptoms worsen or you have concerns that you are not improving.   Thank you for choosing [facility] for your healthcare, and for allowing [place] to partner with you on your  healthcare journey. I am thankful to be able to provide care to you today.   [name] FNP-C    COPD and Physical Activity Chronic obstructive pulmonary disease (COPD) is a long-term (chronic) condition that affects the lungs. COPD is a general term that can be used to describe many different lung problems that cause lung swelling (inflammation) and limit airflow, including chronic bronchitis and emphysema. The main symptom of COPD is shortness of breath, which makes it harder to do even simple tasks. This can also make it harder to exercise and be active. Talk with your health care provider about treatments to help you breathe better and actions you can take to prevent breathing problems during physical activity. What are the benefits of exercising with COPD? Exercising regularly is an important part of a healthy lifestyle. You can still exercise and do physical activities even though you have COPD. Exercise and physical activity improve your shortness of breath by increasing blood flow (circulation). This causes your heart to pump more oxygen through your body. Moderate exercise can improve your:  Oxygen use.  Energy level.  Shortness of breath.  Strength in your breathing muscles.  Heart health.  Sleep.  Self-esteem and feelings of self-worth.  Depression, stress, and anxiety levels. Exercise can benefit everyone with COPD. The severity of your disease may affect how hard you can exercise, especially at first, but everyone can benefit. Talk with your health care provider about how much exercise is safe for you, and which activities and exercises are safe for you. What actions can I take to prevent breathing problems during physical activity?  Sign up for a pulmonary rehabilitation program. This type of program may include: ? Education about lung diseases. ? Exercise classes that teach you how to exercise and be more active while improving your breathing. This usually involves:   Exercise using your lower extremities, such as a stationary bicycle.  About 30 minutes of exercise, 2 to 5 times per week, for 6 to 12 weeks  Strength training, such as push ups or leg lifts. ? Nutrition education. ? Group classes in which you can talk with others who also have COPD and learn ways to manage stress.  If you use an oxygen tank, you should use it while you exercise. Work with your health care provider to adjust your oxygen for your physical activity. Your resting flow rate is different from your flow rate during physical activity.  While you are exercising: ? Take slow breaths. ? Pace yourself and do not try to go too fast. ? Purse your lips while breathing out. Pursing your lips is similar to a kissing or whistling position. ? If doing exercise that uses a quick burst of effort, such as weight lifting:  Breathe in before starting the exercise.  Breathe  out during the hardest part of the exercise (such as raising the weights). Where to find support You can find support for exercising with COPD from:  Your health care provider.  A pulmonary rehabilitation program.  Your local health department or community health programs.  Support groups, online or in-person. Your health care provider may be able to recommend support groups. Where to find more information You can find more information about exercising with COPD from:  American Lung Association: ClassInsider.se.  COPD Foundation: https://www.rivera.net/. Contact a health care provider if:  Your symptoms get worse.  You have chest pain.  You have nausea.  You have a fever.  You have trouble talking or catching your breath.  You want to start a new exercise program or a new activity. Summary  COPD is a general term that can be used to describe many different lung problems that cause lung swelling (inflammation) and limit airflow. This includes chronic bronchitis and emphysema.  Exercise and physical activity improve  your shortness of breath by increasing blood flow (circulation). This causes your heart to provide more oxygen to your body.  Contact your health care provider before starting any exercise program or new activity. Ask your health care provider what exercises and activities are safe for you. This information is not intended to replace advice given to you by your health care provider. Make sure you discuss any questions you have with your health care provider. Document Released: 02/19/2017 Document Revised: 05/19/2018 Document Reviewed: 02/19/2017 Elsevier Patient Education  2020 [employer].

## 2018-12-13 ENCOUNTER — Encounter: Payer: Self-pay | Admitting: Family Medicine

## 2018-12-13 ENCOUNTER — Ambulatory Visit (INDEPENDENT_AMBULATORY_CARE_PROVIDER_SITE_OTHER): Payer: PPO | Admitting: Family Medicine

## 2018-12-13 ENCOUNTER — Other Ambulatory Visit: Payer: Self-pay

## 2018-12-13 VITALS — BP 110/64 | HR 80 | Ht 71.0 in | Wt 152.0 lb

## 2018-12-13 DIAGNOSIS — Z0000 Encounter for general adult medical examination without abnormal findings: Secondary | ICD-10-CM | POA: Diagnosis not present

## 2018-12-13 DIAGNOSIS — B009 Herpesviral infection, unspecified: Secondary | ICD-10-CM | POA: Diagnosis not present

## 2018-12-13 DIAGNOSIS — R1314 Dysphagia, pharyngoesophageal phase: Secondary | ICD-10-CM | POA: Diagnosis not present

## 2018-12-13 MED ORDER — VALACYCLOVIR HCL 500 MG PO TABS: 500.0000 mg | ORAL_TABLET | Freq: Two times a day (BID) | ORAL | 0 refills | Status: DC

## 2018-12-13 NOTE — Progress Notes (Addendum)
Date:  12/13/2018   Name:  Antonio [name]   DOB:  1945/06/25   MRN:  [id number]   Chief Complaint: Annual Exam and Mouth Lesions (needs a refill on valacyclovir)  Patient is a 73 year old male who presents for a comprehensive physical exam. The patient reports the following problems: dysphagia. Health maintenance has been reviewed up to date.  Mouth Lesions  The current episode started more than 1 week ago. The onset was gradual. The problem occurs occasionally. The problem has been gradually improving. The problem is mild. Relieved by: valcyclovir. Nothing aggravates the symptoms. Associated symptoms include mouth sores. Pertinent negatives include no fever, no decreased vision, no double vision, no eye itching, no photophobia, no abdominal pain, no constipation, no diarrhea, no nausea, no congestion, no ear discharge, no ear pain, no headaches, no hearing loss, no rhinorrhea, no sore throat, no stridor, no swollen glands, no neck pain, no cough, no wheezing, no rash, no eye discharge, no eye pain and no eye redness. Urine output has been normal.    Review of Systems  Constitutional: Negative for chills and fever.  HENT: Positive for mouth sores. Negative for congestion, drooling, ear discharge, ear pain, hearing loss, rhinorrhea and sore throat.   Eyes: Negative for double vision, photophobia, pain, discharge, redness and itching.  Respiratory: Negative for cough, shortness of breath, wheezing and stridor.   Cardiovascular: Negative for chest pain, palpitations and leg swelling.  Gastrointestinal: Negative for abdominal pain, blood in stool, constipation, diarrhea and nausea.       Dysphagia  Endocrine: Negative for polydipsia.  Genitourinary: Negative for dysuria, frequency, hematuria and urgency.  Musculoskeletal: Negative for back pain, myalgias and neck pain.  Skin: Negative for rash.  Allergic/Immunologic: Negative for environmental allergies.  Neurological: Negative for  dizziness and headaches.  Hematological: Does not bruise/bleed easily.  Psychiatric/Behavioral: Negative for suicidal ideas. The patient is not nervous/anxious.     Patient Active Problem List   Diagnosis Date Noted  . Medication management 09/20/2018  . Chronic obstructive airway disease ([place]) 09/20/2018  . 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 Diffusion capacity of lung (dl), decreased 03/11/2018  . COPD with acute exacerbation ([place]) 06/22/2017  . Ulcerative colitis with complication ([place]) 59/45/8592  . Cervical stenosis of spinal canal 05/05/2016  . Carotid stenosis 05/02/2016  . Atherosclerosis of native arteries of extremity with intermittent claudication ([place]) 05/02/2016  . Diarrhea 06/09/2014  . Centrilobular Emphysema 04/25/2014  . Former smoker 04/25/2014    Allergies  Allergen Reactions  . Penicillins Rash    Has patient had a PCN reaction causing immediate rash, facial/tongue/throat swelling, SOB or lightheadedness with hypotension:Yes Has patient had a PCN reaction causing severe rash involving mucus membranes or skin necrosis:Yes Has patient had a PCN reaction that required hospitalization:No Has patient had a PCN reaction occurring within the last 10 years:No If all of the above answers are "NO", then may proceed with Cephalosporin use.     Past Surgical History:  Procedure Laterality Date  . ANTERIOR CERVICAL DECOMP/DISCECTOMY FUSION N/A 05/05/2016   Procedure: ANTERIOR CERVICAL DECOMPRESSION FUSION CERVICAL THREE-FOUR.;  Surgeon: Earnie Larsson, MD;  Location: [facility];  Service: Neurosurgery;  Laterality: N/A;  right side approach  . CERVICAL DISCECTOMY  04/2016   C3-C4  . COLONOSCOPY    . COLONOSCOPY  08/09/2015  . [place]  . leg stent Left approx 5-6 yrs ago   2 to left leg  . MENISCUS REPAIR Left  12/2013  . POLYPECTOMY    . ROTATOR CUFF REPAIR Left 2007  . TONSILLECTOMY AND ADENOIDECTOMY  1957    Social History   Tobacco Use   . Smoking status: Former Smoker    Packs/day: 0.75    Years: 35.00    Pack years: 26.25    Types: Cigarettes, Cigars    Quit date: 06/11/2011    Years since quitting: 7.5  . Smokeless tobacco: Never Used  . Tobacco comment: smoking cessation materials not required  Substance Use Topics  . Alcohol use: Yes    Alcohol/week: 7.0 standard drinks    Types: 7 Standard drinks or equivalent per week    Comment: 1-2 day   . Drug use: No     Medication list has been reviewed and updated.  Current Meds  Medication Sig  . albuterol (PROVENTIL) (2.5 MG/3ML) 0.083% nebulizer solution Take 3 mLs (2.5 mg total) by nebulization every 6 (six) hours as needed for wheezing or shortness of breath.  Marland Kitchen albuterol (VENTOLIN HFA) 108 (90 Base) MCG/ACT inhaler INHALE 2 PUFFS INTO THE LUNGS EVERY 6 HOURS AS NEEDED FOR WHEEZING OR SHORTNESS OF BREATH  . Ascorbic Acid (VITAMIN C PO) Take by mouth.  . cetirizine (ZYRTEC) 10 MG tablet Take 10 mg by mouth daily.  . dupilumab (DUPIXENT) 200 MG/1.14ML prefilled syringe Inject 200 mg into the skin once. [place]  . ipratropium-albuterol (DUONEB) 0.5-2.5 (3) MG/3ML SOLN USE 3 ML VIA NEBULIZER EVERY 6 HOURS (Patient taking differently: 3 mLs every 6 (six) hours as needed. USE 3 ML VIA NEBULIZER EVERY 6 HOURS)  . loperamide (IMODIUM) 2 MG capsule Take 2 mg by mouth daily as needed for diarrhea or loose stools.   Marland Kitchen MAGNESIUM PO Take by mouth.  . mesalamine (LIALDA) 1.2 g EC tablet TAKE 4 TABLETS BY MOUTH DAILY WITH BREAKFAST  . Multiple Vitamins-Minerals (ZINC PO) Take by mouth.  . Tiotropium Bromide Monohydrate (SPIRIVA RESPIMAT) 2.5 MCG/ACT AERS Inhale 2 puffs into the lungs daily.  . valACYclovir (VALTREX) 1000 MG tablet   . VITAMIN D PO Take by mouth.   Current Facility-Administered Medications for the 12/13/18 encounter (Office Visit) with [name], MD  Medication  . 0.9 %  sodium chloride infusion    PHQ 2/9 Scores 12/13/2018 07/21/2018 08/03/2017 04/16/2017   PHQ - 2 Score 0 0 1 0  PHQ- 9 Score 0 - 5 0    BP Readings from Last 3 Encounters:  12/13/18 110/64  11/23/18 110/70  09/20/18 124/76    Physical Exam Vitals signs and nursing note reviewed.  Constitutional:      Appearance: Normal appearance. He is well-developed, well-groomed and normal weight.  HENT:     Head: Normocephalic.     Jaw: There is normal jaw occlusion.     Right Ear: Hearing, tympanic membrane, ear canal and external ear normal. There is no impacted cerumen.     Left Ear: Hearing, tympanic membrane, ear canal and external ear normal. There is no impacted cerumen.     Nose: Nose normal. No congestion or rhinorrhea.     Mouth/Throat:     Lips: Pink.     Mouth: Mucous membranes are moist.     Tongue: No lesions.     Palate: No mass and lesions.     Pharynx: Oropharynx is clear. Uvula midline.  Eyes:     General: No scleral icterus.       Right eye: No discharge.  Left eye: No discharge.     Conjunctiva/sclera: Conjunctivae normal.     Pupils: Pupils are equal, round, and reactive to light. Pupils are equal.     Funduscopic exam:    Right eye: Red reflex present.        Left eye: Red reflex present. Neck:     Musculoskeletal: Full passive range of motion without pain, normal range of motion and neck supple.     Thyroid: No thyroid mass, thyromegaly or thyroid tenderness.     Vascular: Normal carotid pulses. No carotid bruit, hepatojugular reflux or JVD.     Trachea: Trachea and phonation normal. No tracheal deviation.  Cardiovascular:     Rate and Rhythm: Normal rate and regular rhythm.  No extrasystoles are present.    Chest Wall: PMI is not displaced. No thrill.     Pulses: Normal pulses.          Carotid pulses are 2+ on the right side and 2+ on the left side.      Radial pulses are 2+ on the right side and 2+ on the left side.       Femoral pulses are 2+ on the right side and 2+ on the left side.      Popliteal pulses are 2+ on the right side and  2+ on the left side.       Dorsalis pedis pulses are 2+ on the right side and 2+ on the left side.       Posterior tibial pulses are 2+ on the right side and 2+ on the left side.     Heart sounds: Normal heart sounds, S1 normal and S2 normal. No murmur. No systolic murmur. No diastolic murmur. No friction rub. No gallop. No S3 or S4 sounds.   Pulmonary:     Effort: Pulmonary effort is normal. No respiratory distress.     Breath sounds: Normal breath sounds and air entry. No decreased breath sounds, wheezing, rhonchi or rales.  Chest:     Chest wall: No mass.     Breasts: Breasts are symmetrical.        Right: Normal.        Left: Normal.  Abdominal:     General: Bowel sounds are normal.     Palpations: Abdomen is soft. There is no hepatomegaly, splenomegaly or mass.     Tenderness: There is no abdominal tenderness. There is no guarding or rebound.     Hernia: No hernia is present.  Genitourinary:    Penis: Normal. No swelling.      Scrotum/Testes: Normal.        Right: Mass not present.        Left: Mass not present.     Epididymis:     Right: Normal.     Left: Normal.     Prostate: Normal. Not enlarged and no nodules present.     Rectum: Guaiac result negative. No mass.  Musculoskeletal: Normal range of motion.        General: No tenderness.     Cervical back: Normal.     Thoracic back: Normal.     Lumbar back: Normal.     Right lower leg: No edema.     Left lower leg: No edema.  Feet:     Right foot:     Skin integrity: Skin integrity normal.     Toenail Condition: Right toenails are normal.     Left foot:     Toenail Condition: Left toenails  are normal.  Lymphadenopathy:     Head:     Right side of head: No submental or submandibular adenopathy.     Left side of head: No submental or submandibular adenopathy.     Cervical: No cervical adenopathy.     Right cervical: No superficial, deep or posterior cervical adenopathy.    Left cervical: No superficial, deep or  posterior cervical adenopathy.  Skin:    General: Skin is warm.     Capillary Refill: Capillary refill takes less than 2 seconds.     Findings: No rash.  Neurological:     Mental Status: He is alert and oriented to person, place, and time.     Cranial Nerves: Cranial nerves are intact. No cranial nerve deficit.     Sensory: Sensation is intact.     Motor: Motor function is intact.     Coordination: Coordination is intact.     Deep Tendon Reflexes: Reflexes are normal and symmetric.  Psychiatric:        Attention and Perception: Attention normal.        Mood and Affect: Mood and affect normal.        Speech: Speech normal.        Cognition and Memory: Cognition and memory normal.     Wt Readings from Last 3 Encounters:  12/13/18 152 lb (68.9 kg)  11/23/18 151 lb (68.5 kg)  09/20/18 152 lb 12.8 oz (69.3 kg)    BP 110/64   Pulse 80   Ht 5' 11"  (1.803 m)   Wt 152 lb (68.9 kg)   SpO2 95%   BMI 21.20 kg/m   Assessment and Plan: 1. Annual physical exam No subjective/objective concerns noted on physical history.  Patient's previous encounters were reviewed as well as imaging and laboratory.  Will obtain lipid panel renal function panel and PSA today.Therin Vetsch Labrosse is a 73 y.o. male who presents today for his Complete Annual Exam. He feels well. He reports exercising . He reports he is sleeping well.Immunizations are reviewed and recommendations provided.   Age appropriate screening tests are discussed. Counseling given for risk factor reduction interventions.  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  Patient had an initial 2 g dose twice a day.  We will follow that up with 500 mg twice a day for 1 week. - valACYclovir (VALTREX) 500 MG tablet; Take 1 tablet (500 mg total) by mouth 2 (two) times daily.  Dispense: 14 tablet; Refill: 0  3. Pharyngoesophageal dysphagia  Patient on history notes that he is having some pharyngeal esophageal dysphagia associated with swallowing bread.  He does not seem to notice it with chicken or steak.  Patient is clinically keep a monitor of this and if it is to continue patient is to contact us and we will refer back to Dr. Hilarie Fredrickson for evaluation and possible endoscopy.

## 2018-12-14 LAB — RENAL FUNCTION PANEL
Albumin: 4.7 g/dL (ref 3.7–4.7)
BUN/Creatinine Ratio: 8 — ABNORMAL LOW (ref 10–24)
BUN: 9 mg/dL (ref 8–27)
CO2: 25 mmol/L (ref 20–29)
Calcium: 9.8 mg/dL (ref 8.6–10.2)
Chloride: 101 mmol/L (ref 96–106)
Creatinine, Ser: 1.08 mg/dL (ref 0.76–1.27)
GFR calc Af Amer: 77 mL/min/{1.73_m2} (ref >59)
GFR calc non Af Amer: 67 mL/min/{1.73_m2} (ref >59)
Glucose: 106 mg/dL — ABNORMAL HIGH (ref 65–99)
Phosphorus: 2.8 mg/dL (ref 2.8–4.1)
Potassium: 4.5 mmol/L (ref 3.5–5.2)
Sodium: 139 mmol/L (ref 134–144)

## 2018-12-14 LAB — LIPID PANEL
Chol/HDL Ratio: 2.6 ratio (ref 0.0–5.0)
Cholesterol, Total: 161 mg/dL (ref 100–199)
HDL: 61 mg/dL (ref >39)
LDL Chol Calc (NIH): 88 mg/dL (ref 0–99)
Triglycerides: 60 mg/dL (ref 0–149)
VLDL Cholesterol Cal: 12 mg/dL (ref 5–40)

## 2018-12-14 LAB — PSA: Prostate Specific Ag, Serum: 0.4 ng/mL (ref 0.0–4.0)

## 2018-12-23 ENCOUNTER — Ambulatory Visit
Admission: EM | Admit: 2018-12-23 | Discharge: 2018-12-23 | Disposition: A | Discharge status: Home / Self Care | Payer: PPO | Attending: Family Medicine | Admitting: Family Medicine

## 2018-12-23 ENCOUNTER — Ambulatory Visit: Payer: PPO | Admitting: Pulmonary Disease

## 2018-12-23 ENCOUNTER — Other Ambulatory Visit: Payer: Self-pay

## 2018-12-23 DIAGNOSIS — S61214A Laceration without foreign body of right ring finger without damage to nail, initial encounter: Secondary | ICD-10-CM

## 2018-12-23 DIAGNOSIS — W01198A Fall on same level from slipping, tripping and stumbling with subsequent striking against other object, initial encounter: Secondary | ICD-10-CM | POA: Diagnosis not present

## 2018-12-23 NOTE — ED Triage Notes (Signed)
Pt states he fell down this morning. Pt states he was getting to dog from the dog pen. Pt has a laceration on his right ring finger.

## 2018-12-23 NOTE — ED Provider Notes (Signed)
[facility]    CSN: [id number] Arrival date & time: 12/23/18  0920  History   Chief Complaint Chief Complaint  Patient presents with  . Laceration   HPI  73 year old male presents with a laceration.  Patient states that he was getting his dog from outside after he use restroom.  Due to the weather, he suffered a fall.  In doing so he hit the ground and somehow suffered a laceration to the palmar aspect of his right ring finger at the PIP joint.  Bleeding is well controlled.  His last tetanus was in 2013.  Patient reports moderate pain currently, 5/10 in severity.  Wound appears to be clean.  No other reported symptoms.  No other complaints.  PMH, Surgical Hx, Family Hx, Social History reviewed and updated as below.  Past Medical History:  Diagnosis Date  . Allergy   . Calf pain   . COPD (chronic obstructive pulmonary disease) (HCC)    no Oxygen per pt  . Dyspnea   . Hypokalemia   . Internal hemorrhoids   . Muscle cramp   . Muscle pain   . Pneumonia   . PONV (postoperative nausea and vomiting)   . Tubular adenoma of colon   . Ulcerative colitis [facility])    Patient Active Problem List   Diagnosis Date Noted  . Medication management 09/20/2018  . Chronic obstructive airway disease ([place]) 09/20/2018  . 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 Diffusion capacity of lung (dl), decreased 03/11/2018  . COPD with acute exacerbation ([place]) 06/22/2017  . Ulcerative colitis with complication ([place]) 34/35/6861  . Cervical stenosis of spinal canal 05/05/2016  . Carotid stenosis 05/02/2016  . Atherosclerosis of native arteries of extremity with intermittent claudication ([place]) 05/02/2016  . Diarrhea 06/09/2014  . Centrilobular Emphysema 04/25/2014  . Former smoker 04/25/2014   Past Surgical History:  Procedure Laterality Date  . ANTERIOR CERVICAL DECOMP/DISCECTOMY FUSION N/A 05/05/2016   Procedure: ANTERIOR CERVICAL DECOMPRESSION FUSION CERVICAL THREE-FOUR.;   Surgeon: Earnie Larsson, MD;  Location: [place];  Service: Neurosurgery;  Laterality: N/A;  right side approach  . CERVICAL DISCECTOMY  04/2016   C3-C4  . COLONOSCOPY    . COLONOSCOPY  08/09/2015  . [place]  . leg stent Left approx 5-6 yrs ago   2 to left leg  . MENISCUS REPAIR Left 12/2013  . POLYPECTOMY    . ROTATOR CUFF REPAIR Left 2007  . TONSILLECTOMY AND ADENOIDECTOMY  1957   Home Medications    Prior to Admission medications   Medication Sig Start Date End Date Taking? Authorizing Provider  albuterol (PROVENTIL) (2.5 MG/3ML) 0.083% nebulizer solution Take 3 mLs (2.5 mg total) by nebulization every 6 (six) hours as needed for wheezing or shortness of breath. 02/01/18   [name], MD  albuterol (VENTOLIN HFA) 108 (90 Base) MCG/ACT inhaler INHALE 2 PUFFS INTO THE LUNGS EVERY 6 HOURS AS NEEDED FOR WHEEZING OR SHORTNESS OF BREATH 06/25/18   Juanito Doom, MD  Ascorbic Acid (VITAMIN C PO) Take by mouth.    [provider]  cetirizine (ZYRTEC) 10 MG tablet Take 10 mg by mouth daily.    [provider]  dupilumab (DUPIXENT) 200 MG/1.14ML prefilled syringe Inject 200 mg into the skin once. [place]    [provider]  ipratropium-albuterol (DUONEB) 0.5-2.5 (3) MG/3ML SOLN USE 3 ML VIA NEBULIZER EVERY 6 HOURS Patient taking differently: 3 mLs every 6 (six) hours as needed. USE 3 ML VIA  NEBULIZER EVERY 6 HOURS 04/28/18   Juanito Doom, MD  loperamide (IMODIUM) 2 MG capsule Take 2 mg by mouth daily as needed for diarrhea or loose stools.     [provider]  MAGNESIUM PO Take by mouth.    [provider]  mesalamine (LIALDA) 1.2 g EC tablet TAKE 4 TABLETS BY MOUTH DAILY WITH BREAKFAST 08/24/18   Pyrtle, Lajuan Lines, MD  Multiple Vitamins-Minerals (ZINC PO) Take by mouth.    [provider]  Tiotropium Bromide Monohydrate (SPIRIVA RESPIMAT) 2.5 MCG/ACT AERS Inhale 2 puffs into the lungs daily. 08/10/18   Lauraine Rinne, NP   valACYclovir (VALTREX) 500 MG tablet Take 1 tablet (500 mg total) by mouth 2 (two) times daily. 12/13/18   [name], MD  VITAMIN D PO Take by mouth.    [provider]    Family History Family History  Problem Relation Age of Onset  . Cancer Mother        leukemia  . Colon polyps Mother   . Heart disease Father   . Heart attack Father   . Healthy Daughter   . Healthy Son   . Colon cancer Neg Hx   . Esophageal cancer Neg Hx   . Rectal cancer Neg Hx   . Stomach cancer Neg Hx     Social History Social History   Tobacco Use  . Smoking status: Former Smoker    Packs/day: 0.75    Years: 35.00    Pack years: 26.25    Types: Cigarettes, Cigars    Quit date: 06/11/2011    Years since quitting: 7.5  . Smokeless tobacco: Never Used  . Tobacco comment: smoking cessation materials not required  Substance Use Topics  . Alcohol use: Yes    Alcohol/week: 7.0 standard drinks    Types: 7 Standard drinks or equivalent per week    Comment: 1-2 day   . Drug use: No     Allergies   Penicillins   Review of Systems Review of Systems  Constitutional: Negative.   Skin: Positive for wound.   Physical Exam Triage Vital Signs ED Triage Vitals  Enc Vitals Group     BP 12/23/18 0954 140/69     Pulse Rate 12/23/18 0954 70     Resp 12/23/18 0954 18     Temp 12/23/18 0954 98.4 F (36.9 C)     Temp Source 12/23/18 0954 Oral     SpO2 12/23/18 0954 97 %     Weight 12/23/18 0956 152 lb (68.9 kg)     Height --      Head Circumference --      Peak Flow --      Pain Score 12/23/18 0956 5     Pain Loc --      Pain Edu? --      Excl. in [place]? --    Updated Vital Signs BP 140/69 (BP Location: Right Arm)   Pulse 70   Temp 98.4 F (36.9 C) (Oral)   Resp 18   Wt 68.9 kg   SpO2 97%   BMI 21.20 kg/m   Visual Acuity Right Eye Distance:   Left Eye Distance:   Bilateral Distance:    Right Eye Near:   Left Eye Near:    Bilateral Near:     Physical Exam Vitals signs  and nursing note reviewed.  Constitutional:      General: He is not in acute distress.    Appearance: Normal  appearance. He is not ill-appearing.  HENT:     Head: Normocephalic and atraumatic.     Nose: Nose normal.  Eyes:     General:        Right eye: No discharge.        Left eye: No discharge.     Conjunctiva/sclera: Conjunctivae normal.  Pulmonary:     Effort: Pulmonary effort is normal. No respiratory distress.  Musculoskeletal:     Comments: Flexion intact of the right fourth digit.  Skin:    Comments: 2.5 cm linear laceration noted on the palmar aspect of the fourth digit of the right hand at the level of the PIP joint.  Bleeding well controlled.  Flexor tendon was visually inspected after probing the wound.  It is intact.  There are no irregularities.  Neurological:     Mental Status: He is alert.  Psychiatric:        Mood and Affect: Mood normal.        Behavior: Behavior normal.    UC Treatments / Results  Labs (all labs ordered are listed, but only abnormal results are displayed) Labs Reviewed - No data to display  EKG   Radiology No results found.  Procedures Laceration Repair  Date/Time: 12/23/2018 11:20 AM Performed by: Coral Spikes, DO Authorized by: Coral Spikes, DO   Consent:    Consent obtained:  Verbal   Consent given by:  Patient Anesthesia (see MAR for exact dosages):    Anesthesia method:  Local infiltration   Local anesthetic:  Lidocaine 1% w/o epi Laceration details:    Location:  Finger   Finger location:  R ring finger   Length (cm):  2.5 Repair type:    Repair type:  Simple Pre-procedure details:    Preparation:  Patient was prepped and draped in usual sterile fashion Exploration:    Hemostasis achieved with:  Direct pressure   Wound exploration: wound explored through full range of motion and entire depth of wound probed and visualized     Wound extent: no tendon damage noted     Contaminated: no   Treatment:    Area  cleansed with:  Saline and Betadine   Amount of cleaning:  Standard   Irrigation solution:  Sterile water   Irrigation method:  Syringe Skin repair:    Repair method:  Sutures   Suture size:  5-0   Suture material:  Prolene   Suture technique:  Simple interrupted   Number of sutures:  6 Approximation:    Approximation:  Close Post-procedure details:    Dressing:  Antibiotic ointment and non-adherent dressing   Patient tolerance of procedure:  Tolerated well, no immediate complications   (including critical care time)  Medications Ordered in UC Medications - No data to display  Initial Impression / Assessment and Plan / UC Course  I have reviewed the triage vital signs and the nursing notes.  Pertinent labs & imaging results that were available during my care of the patient were reviewed by me and considered in my medical decision making (see chart for details).    73 year old male presents with a laceration.  Repaired as above.  Sutures out in 10 days.  Supportive care.  Final Clinical Impressions(s) / UC Diagnoses   Final diagnoses:  Laceration of right ring finger without foreign body without damage to nail, initial encounter     Discharge Instructions     Return for suture removal in 10 days.  Take care  Dr. Lacinda Axon    ED Prescriptions    None     PDMP not reviewed this encounter.   Coral Spikes, [place] 12/23/18 1121

## 2018-12-23 NOTE — Discharge Instructions (Signed)
Return for suture removal in 10 days.  Take care  Dr. Lacinda Axon

## 2018-12-29 ENCOUNTER — Other Ambulatory Visit: Payer: Self-pay

## 2018-12-29 ENCOUNTER — Encounter: Payer: Self-pay | Admitting: Pulmonary Disease

## 2018-12-29 ENCOUNTER — Ambulatory Visit: Payer: PPO | Admitting: Pulmonary Disease

## 2018-12-29 VITALS — BP 130/70 | HR 72 | Temp 98.8°F | Ht 71.0 in | Wt 152.8 lb

## 2018-12-29 DIAGNOSIS — J449 Chronic obstructive pulmonary disease, unspecified: Secondary | ICD-10-CM | POA: Diagnosis not present

## 2018-12-29 DIAGNOSIS — J4550 Severe persistent asthma, uncomplicated: Secondary | ICD-10-CM | POA: Diagnosis not present

## 2018-12-29 NOTE — Progress Notes (Signed)
Antonio [name]    [id number]    [birth year]  Primary Care Physician:[name], Antonio Finn, MD  Referring Physician: [name], MD [address] [place],  [place] [address]  Chief complaint: Follow-up for COPD, asthma  HPI: 73 year old with COPD, asthma with elevated IgE, peripheral eosinophils.  Former patient of Dr. Jeoffrey [name] on Symbicort, Spiriva. Patient has seen an allergist in early 2020 with negative skin testing Started on [place] in September 2020.  He is getting home injections administered by his daughter who is a Marine scientist.  He is tolerating it well but cannot tell if there is any difference in his breathing yet.  Pets: Dogs, no cats, birds, farm animals Occupation: Retired Museum/gallery conservator Exposures: No known exposures.  No mold, hot tub, Jacuzzi.  No feather pillows or comforters Smoking history: 26-pack-year smoker.  Quit in 2013 Travel history: Originally from [place].  Moved to [place] in the 1950s.  No significant recent travel Relevant family history: No significant family history of lung disease  Outpatient Encounter Medications as of 12/29/2018  Medication Sig  . albuterol (PROVENTIL) (2.5 MG/3ML) 0.083% nebulizer solution Take 3 mLs (2.5 mg total) by nebulization every 6 (six) hours as needed for wheezing or shortness of breath.  Marland Kitchen albuterol (VENTOLIN HFA) 108 (90 Base) MCG/ACT inhaler INHALE 2 PUFFS INTO THE LUNGS EVERY 6 HOURS AS NEEDED FOR WHEEZING OR SHORTNESS OF BREATH  . Ascorbic Acid (VITAMIN C PO) Take by mouth.  . cetirizine (ZYRTEC) 10 MG tablet Take 10 mg by mouth daily.  . dupilumab (DUPIXENT) 200 MG/1.14ML prefilled syringe Inject 200 mg into the skin once. [place]  . ipratropium-albuterol (DUONEB) 0.5-2.5 (3) MG/3ML SOLN USE 3 ML VIA NEBULIZER EVERY 6 HOURS (Patient taking differently: 3 mLs every 6 (six) hours as needed. USE 3 ML VIA NEBULIZER EVERY 6 HOURS)  . loperamide (IMODIUM) 2 MG capsule Take 2 mg  by mouth daily as needed for diarrhea or loose stools.   Marland Kitchen MAGNESIUM PO Take by mouth.  . mesalamine (LIALDA) 1.2 g EC tablet TAKE 4 TABLETS BY MOUTH DAILY WITH BREAKFAST  . Multiple Vitamins-Minerals (ZINC PO) Take by mouth.  . Tiotropium Bromide Monohydrate (SPIRIVA RESPIMAT) 2.5 MCG/ACT AERS Inhale 2 puffs into the lungs daily.  . valACYclovir (VALTREX) 500 MG tablet Take 1 tablet (500 mg total) by mouth 2 (two) times daily.  Marland Kitchen VITAMIN D PO Take by mouth.   Facility-Administered Encounter Medications as of 12/29/2018  Medication  . 0.9 %  sodium chloride infusion    Allergies as of 12/29/2018 - Review Complete 12/29/2018  Allergen Reaction Noted  . Penicillins Rash 12/23/2012    Past Medical History:  Diagnosis Date  . Allergy   . Calf pain   . COPD (chronic obstructive pulmonary disease) (HCC)    no Oxygen per pt  . Dyspnea   . Hypokalemia   . Internal hemorrhoids   . Muscle cramp   . Muscle pain   . Pneumonia   . PONV (postoperative nausea and vomiting)   . Tubular adenoma of colon   . Ulcerative colitis [facility])     Past Surgical History:  Procedure Laterality Date  . ANTERIOR CERVICAL DECOMP/DISCECTOMY FUSION N/A 05/05/2016   Procedure: ANTERIOR CERVICAL DECOMPRESSION FUSION CERVICAL THREE-FOUR.;  Surgeon: Earnie Larsson, MD;  Location: [place];  Service: Neurosurgery;  Laterality: N/A;  right side approach  . CERVICAL DISCECTOMY  04/2016   C3-C4  . COLONOSCOPY    .  COLONOSCOPY  08/09/2015  . [place]  . leg stent Left approx 5-6 yrs ago   2 to left leg  . MENISCUS REPAIR Left 12/2013  . POLYPECTOMY    . ROTATOR CUFF REPAIR Left 2007  . TONSILLECTOMY AND ADENOIDECTOMY  1957    Family History  Problem Relation Age of Onset  . Cancer Mother        leukemia  . Colon polyps Mother   . Heart disease Father   . Heart attack Father   . Healthy Daughter   . Healthy Son   . Colon cancer Neg Hx   . Esophageal cancer Neg Hx   . Rectal cancer Neg Hx    . Stomach cancer Neg Hx     Social History   Socioeconomic History  . Marital status: Married    Spouse name: Not on file  . Number of children: 2  . Years of education: Not on file  . Highest education level: Bachelor's degree (e.g., BA, AB, BS)  Occupational History  . Occupation: retired  Scientific laboratory technician  . Financial resource strain: Not hard at all  . Food insecurity    Worry: Never true    Inability: Never true  . Transportation needs    Medical: No    Non-medical: No  Tobacco Use  . Smoking status: Former Smoker    Packs/day: 0.75    Years: 35.00    Pack years: 26.25    Types: Cigarettes, Cigars    Quit date: 06/11/2011    Years since quitting: 7.5  . Smokeless tobacco: Never Used  . Tobacco comment: smoking cessation materials not required  Substance and Sexual Activity  . Alcohol use: Yes    Alcohol/week: 7.0 standard drinks    Types: 7 Standard drinks or equivalent per week    Comment: 1-2 day   . Drug use: No  . Sexual activity: Yes  Lifestyle  . Physical activity    Days per week: 0 days    Minutes per session: 0 min  . Stress: Not at all  Relationships  . Social Herbalist on phone: Patient refused    Gets together: Patient refused    Attends religious service: Patient refused    Active member of club or organization: Patient refused    Attends meetings of clubs or organizations: Patient refused    Relationship status: Married  . Intimate partner violence    Fear of current or ex partner: No    Emotionally abused: No    Physically abused: No    Forced sexual activity: No  Other Topics Concern  . Not on file  Social History Narrative  . Not on file    Review of systems: Review of Systems  Constitutional: Negative for fever and chills.  HENT: Negative.   Eyes: Negative for blurred vision.  Respiratory: as per HPI  Cardiovascular: Negative for chest pain and palpitations.  Gastrointestinal: Negative for vomiting, diarrhea, blood per  rectum. Genitourinary: Negative for dysuria, urgency, frequency and hematuria.  Musculoskeletal: Negative for myalgias, back pain and joint pain.  Skin: Negative for itching and rash.  Neurological: Negative for dizziness, tremors, focal weakness, seizures and loss of consciousness.  Endo/Heme/Allergies: Negative for environmental allergies.  Psychiatric/Behavioral: Negative for depression, suicidal ideas and hallucinations.  All other systems reviewed and are negative.  Physical Exam: Blood pressure 130/70, pulse 72, temperature 98.8 F (37.1 C), temperature source Temporal, height 5' 11"  (1.803 m), weight 152 lb  12.8 oz (69.3 kg), SpO2 97 %. Gen:      No acute distress HEENT:  EOMI, sclera anicteric Neck:     No masses; no thyromegaly Lungs:    Clear to auscultation bilaterally; normal respiratory effort CV:         Regular rate and rhythm; no murmurs Abd:      + bowel sounds; soft, non-tender; no palpable masses, no distension Ext:    No edema; adequate peripheral perfusion Skin:      Warm and dry; no rash Neuro: alert and oriented x 3 Psych: normal mood and affect  Data Reviewed: Imaging: CT high-resolution 04/16/2018- No ILD.  Mild to moderate centrilobular and paraseptal emphysema.  Scattered inflammatory scarring bilaterally.  Left main and two-vessel coronary atherosclerosis. I have reviewed the images personally.  PFTs: 04/20/2018 FVC 2.60 [61%), FEV1 1.02 [33%), F/F 39, TLC 8.44 [119%], DLCO 14.01 [55%] Severe obstruction with air trapping and diffusion impairment.  Labs: CBC 04/20/2018-WBC 8.6, eos 7.3%, absolute eosinophil count 628 IgE 04/20/2018-67  Assessment:  Severe COPD, asthma overlap syndrome Continue Symbicort, Spiriva Started on Dupixent earlier this year.  Continue current therapy  Health maintenance 10/06/2018-influenza 04/18/2014-Prevnar 11/26/2015-Pneumovax  Plan/Recommendations: - Continue Spiriva, Symbicort - Continue Dupixent therapy.  Marshell Garfinkel MD [facility] 12/29/2018, 11:15 AM  CC: Antonio Patch, MD

## 2018-12-31 NOTE — Patient Instructions (Signed)
Continue current inhaler regimen and injection therapy We will follow back with you in 3 months to check on how you are doing Please give a call sooner if there is any change in symptoms.

## 2019-01-03 ENCOUNTER — Other Ambulatory Visit: Payer: Self-pay

## 2019-01-04 ENCOUNTER — Ambulatory Visit
Admission: EM | Admit: 2019-01-04 | Discharge: 2019-01-04 | Disposition: A | Discharge status: Home / Self Care | Payer: PPO | Attending: Family Medicine | Admitting: Family Medicine

## 2019-01-04 ENCOUNTER — Other Ambulatory Visit: Payer: Self-pay

## 2019-01-04 NOTE — ED Triage Notes (Signed)
Patient here for suture removal to his right ring finger.

## 2019-01-11 ENCOUNTER — Telehealth: Payer: Self-pay | Admitting: Pulmonary Disease

## 2019-01-11 NOTE — Telephone Encounter (Signed)
Patient returned phone call.  Patient phone number is [phone].

## 2019-01-11 NOTE — Telephone Encounter (Signed)
Received patient assistance forms for patient's Spiriva. I have filled out Brian's portion and he has signed the provider portion. I have also faxed the completed form to [facility].   ATC patient to make him aware that this has been taken care of. Will this encounter open for follow up.

## 2019-01-11 NOTE — Telephone Encounter (Signed)
Spoke with the pt and notified that his forms were taken care of  He verbalized understanding and nothing further needed

## 2019-01-20 ENCOUNTER — Other Ambulatory Visit: Payer: Self-pay

## 2019-01-20 ENCOUNTER — Ambulatory Visit (INDEPENDENT_AMBULATORY_CARE_PROVIDER_SITE_OTHER): Payer: PPO | Admitting: Pulmonary Disease

## 2019-01-20 ENCOUNTER — Encounter: Payer: Self-pay | Admitting: Pulmonary Disease

## 2019-01-20 DIAGNOSIS — Z20822 Contact with and (suspected) exposure to covid-19: Secondary | ICD-10-CM

## 2019-01-20 DIAGNOSIS — J4550 Severe persistent asthma, uncomplicated: Secondary | ICD-10-CM

## 2019-01-20 DIAGNOSIS — J449 Chronic obstructive pulmonary disease, unspecified: Secondary | ICD-10-CM | POA: Diagnosis not present

## 2019-01-20 DIAGNOSIS — Z8616 Personal history of COVID-19: Secondary | ICD-10-CM | POA: Insufficient documentation

## 2019-01-20 DIAGNOSIS — Z20828 Contact with and (suspected) exposure to other viral communicable diseases: Secondary | ICD-10-CM

## 2019-01-20 MED ORDER — AZITHROMYCIN 250 MG PO TABS: ORAL_TABLET | ORAL | 0 refills | Status: DC

## 2019-01-20 MED ORDER — PREDNISONE 10 MG PO TABS: ORAL_TABLET | ORAL | 0 refills | Status: DC

## 2019-01-20 NOTE — Assessment & Plan Note (Signed)
Patient son recently tested positive for Covid on 01/18/2019 Patient was with son this weekend for a birthday party Patient started to develop low-grade fever as well as nasal congestion on 01/19/2019  Plan: Obtain outpatient Covid testing Patient may be considered appropriate patient for monoclonal antibody infusion if in fact positive for Covid

## 2019-01-20 NOTE — Progress Notes (Signed)
Virtual Visit via Telephone Note  I connected with Antonio [name] on 01/20/19 at  2:30 PM EST by telephone and verified that I am speaking with the correct person using two identifiers.  Location: Patient: Home Provider: [facility] - [address], [place], [place], [place] [address]   I discussed the limitations, risks, security and privacy concerns of performing an evaluation and management service by telephone and the availability of in person appointments. I also discussed with the patient that there may be a patient responsible charge related to this service. The patient expressed understanding and agreed to proceed.  Patient consented to consult via telephone: Yes People present and their role in pt care: Pt   History of Present Illness:  73 year old male former smoker followed in our office for severe COPD with emphysema  PMH: CAD, arthrosclerosis Smoker/ Smoking History: Former smoker quit 2013.  26.25 pack years Maintenance: Dulera 200, Spiriva 2.5,  Dupixent  Pt of: Dr. Vaughan Browner  Chief complaint: Fever, congestion  73 year old male former smoker followed in our office for emphysema.  Patient was last seen in our office on 12/29/2018.  At that point in time patient establish care with Dr. Vaughan Browner as his new pulmonologist.  Patient was encouraged to continue [facility] and Spiriva as well as continue Dupixent.  Is recommended to follow-up with our office in 3 months.  Patient contacted our office on 01/20/2019 reporting acute symptoms such as low-grade fever as well as increased nasal congestion and runny nose.  Patient was scheduled for a telephone visit to further evaluate.  Patient is completing a telephone visit today.  Patient reports that his son recently tested positive for Covid on Tuesday, 01/18/2019.  Unfortunately the patient as well as his spouse were with his son this weekend for a birthday party they report that it was physically distance but they did  not wear a mask.  Both husband as well as spouse have low-grade temps as well as runny nose.  Patient's last temperature was 99.4.  This is without antipyretics.  Patient denies fatigue, worsening cough, shortness of breath, loss of smell or taste.  He denies chills.   Observations/Objective:  01/20/2019 - temp - 99.4  06/09/2014 pulmonary function test> ratio 51%, FEV1 1.47 L (40% predicted), total lung capacity 5.43 L (71% predicted). DLCO 16.26 (44% predicted)  December 2017 lung function testing showed ratio 50%, FEV1 1.47 L 43% predicted, FVC 2.96 L 67% predicted  March 2020 pulmonary function testing ratio 38%, FEV1 0.97 L 31% predicted, total lung capacity 8.44 L 119% predicted, residual volume 198% predicted, DLCO 14.0 55% predicted  March 2020 high-resolution CT scan of the chest images : Centrilobular emphysema, no interstitial lung disease, aortic atherosclerosis  04/20/2018-eosinophils relative 7.3, eosinophils absolute 0.6 04/20/2018-IgE-67  Social History   Tobacco Use  Smoking Status Former Smoker  . Packs/day: 0.75  . Years: 35.00  . Pack years: 26.25  . Types: Cigarettes, Cigars  . Quit date: 06/11/2011  . Years since quitting: 7.6  Smokeless Tobacco Never Used  Tobacco Comment   smoking cessation materials not required   Immunization History  Administered Date(s) Administered  . Influenza Inj Mdck Quad Pf 10/28/2017  . Influenza Split 11/24/2013  . Influenza, High Dose Seasonal PF 11/05/2016, 10/29/2017, 10/06/2018  . Influenza,inj,Quad PF,6+ Mos 11/08/2014, 11/26/2015  . Influenza-Unspecified 10/11/2013, 10/11/2013  . Pneumococcal Conjugate-13 04/18/2014  . Pneumococcal Polysaccharide-23 11/26/2015  . Tdap 02/13/2011  . Zoster 11/07/2010  . Zoster Recombinat (Shingrix) 12/01/2016,  03/02/2017      Assessment and Plan:  Severe persistent asthma without complication Plan: Continue Dulera Continue Dupixent Continue Spiriva Respimat 2.5 Out patient  Covid testing 1 to 2-week telephonic follow-up with our office  Centrilobular Emphysema Plan: Z-Pak today Prednisone course today Continue Dulera 200 Continue Spiriva Respimat 2.5 Continue duo nebs as needed Continue Dupixent  Increase daily physical activity Follow-up with our office in 1 to 2 weeks with a telephonic visit Obtain outpatient Covid testing  Close exposure to COVID-19 virus Patient son recently tested positive for Covid on 01/18/2019 Patient was with son this weekend for a birthday party Patient started to develop low-grade fever as well as nasal congestion on 01/19/2019  Plan: Obtain outpatient Covid testing Patient may be considered appropriate patient for monoclonal antibody infusion if in fact positive for Covid    Follow Up Instructions:  Return in about 2 weeks (around 02/03/2019), or if symptoms worsen or fail to improve, for Follow up with [name] FNP-C.   I discussed the assessment and treatment plan with the patient. The patient was provided an opportunity to ask questions and all were answered. The patient agreed with the plan and demonstrated an understanding of the instructions.   The patient was advised to call back or seek an in-person evaluation if the symptoms worsen or if the condition fails to improve as anticipated.  I provided 23 minutes of non-face-to-face time during this encounter.   Lauraine Rinne, NP

## 2019-01-20 NOTE — Patient Instructions (Signed)
You were seen today by Lauraine Rinne, NP  for:   1. COPD, moderate ([place])  - predniSONE (DELTASONE) 10 MG tablet; Take 2 tablets (70m total) daily for the next 5 days. Take in the AM with food.  Dispense: 10 tablet; Refill: 0 - azithromycin (ZITHROMAX) 250 MG tablet; 502m(two tablets) today, then 25057m1 tablet) for the next 4 days  Dispense: 6 tablet; Refill: 0  Close exposure to SARS-CoV-2 virus  Likely early start of COPD exacerbation  We will treat with Z-Pak as well as short course of steroids while we are waiting Covid results  2. Severe persistent asthma without complication  Continue Dupixent  Continue Dulera 200  Continue Spiriva Respimat 2.5  3. Close exposure to COVID-19 virus  - Novel Coronavirus, NAA ([facility])  COVID Testing Site Locations (For sick patients only, pre-procedure is done differently)  . [facility].m[facility] - 3 p.m.     We recommend today:  Orders Placed This Encounter  Procedures  . Novel Coronavirus, NAA ([facility])    Order Specific Question:   Is this test for diagnosis or screening    Answer:   Diagnosis of ill patient    Order Specific Question:   Symptomatic for COVID-19 as defined by CDC    Answer:   Yes    Order Specific Question:   Date of Symptom Onset    Answer:   01/19/2019    Order Specific Question:   Hospitalized for COVID-19    Answer:   No    Order Specific Question:   Admitted to ICU for COVID-19    Answer:   No    Order Specific Question:   Previously tested for COVID-19    Answer:   No    Order Specific Question:   Resident in a congregate (group) care setting    Answer:   No    Order Specific Question:   Is the patient student?    Answer:   No    Order Specific Question:   Employed in healthcare setting    Answer:   No    Order Specific  Question:   Release to patient    Answer:   Immediate   Orders Placed This Encounter  Procedures  . Novel Coronavirus, NAA ([facility])   Meds ordered this encounter  Medications  . predniSONE (DELTASONE) 10 MG tablet    Sig: Take 2 tablets (18m72mtal) daily for the next 5 days. Take in the AM with food.    Dispense:  10 tablet    Refill:  0  . azithromycin (ZITHROMAX) 250 MG tablet    Sig: 500mg10mo tablets) today, then 250mg 22mablet) for the next 4 days    Dispense:  6 tablet    Refill:  0    Follow Up:    No follow-ups on file.   Please do your part to reduce the spread of COVID-19:      Reduce your risk of any infection  and COVID19 by using the similar precautions used for avoiding the common cold or flu:  . WashMarland Kitchenyour hands often with soap and warm water for at least 20 seconds.  If soap and water are not readily available, use an alcohol-based hand sanitizer with at least 60% alcohol.  .Marland Kitchen  If coughing or sneezing, cover your mouth and nose by coughing or sneezing into the elbow areas of your shirt or coat, into a tissue or into your sleeve (not your hands). Langley Gauss A MASK when in public  . Avoid shaking hands with others and consider head nods or verbal greetings only. . Avoid touching your eyes, nose, or mouth with unwashed hands.  . Avoid close contact with people who are sick. . Avoid places or events with large numbers of people in one location, like concerts or sporting events. . If you have some symptoms but not all symptoms, continue to monitor at home and seek medical attention if your symptoms worsen. . If you are having a medical emergency, call 911.   [place] / e-Visit: [email or web address]         [facility]: [facility]: [phone]                   [facility]: [phone]     It is flu  season:   >>> Best ways to protect herself from the flu: Receive the yearly flu vaccine, practice good hand hygiene washing with soap and also using hand sanitizer when available, eat a nutritious meals, get adequate rest, hydrate appropriately   Please contact the office if your symptoms worsen or you have concerns that you are not improving.   Thank you for choosing [facility] for your healthcare, and for allowing [place] to partner with you on your healthcare journey. I am thankful to be able to provide care to you today.   [name] FNP-C

## 2019-01-20 NOTE — Assessment & Plan Note (Addendum)
Plan: Z-Pak today Prednisone course today Continue Dulera 200 Continue Spiriva Respimat 2.5 Continue duo nebs as needed Continue Dupixent  Increase daily physical activity Follow-up with our office in 1 to 2 weeks with a telephonic visit Obtain outpatient Covid testing

## 2019-01-20 NOTE — Assessment & Plan Note (Signed)
Plan: Continue Dulera Continue Dupixent Continue Spiriva Respimat 2.5 Out patient Covid testing 1 to 2-week telephonic follow-up with our office

## 2019-01-21 LAB — NOVEL CORONAVIRUS, NAA: SARS-CoV-2, NAA: DETECTED — AB

## 2019-01-24 ENCOUNTER — Other Ambulatory Visit: Payer: Self-pay | Admitting: Pulmonary Disease

## 2019-01-24 ENCOUNTER — Other Ambulatory Visit: Payer: Self-pay | Admitting: Nurse Practitioner

## 2019-01-24 DIAGNOSIS — U071 COVID-19: Secondary | ICD-10-CM

## 2019-01-24 DIAGNOSIS — J441 Chronic obstructive pulmonary disease with (acute) exacerbation: Secondary | ICD-10-CM

## 2019-01-24 NOTE — Progress Notes (Signed)
  I connected by phone with Antonio [name] on 01/24/2019 at 7:57 AM to discuss the potential use of an new treatment for mild to moderate COVID-19 viral infection in non-hospitalized patients.  This patient is a 73 y.o. male that meets the FDA criteria for Emergency Use Authorization of bamlanivimab or casirivimab\imdevimab.  Has a (+) direct SARS-CoV-2 viral test result  Has mild or moderate COVID-19   Is ? 75 years of age and weighs ? 40 kg  Is NOT hospitalized due to COVID-19  Is NOT requiring oxygen therapy or requiring an increase in baseline oxygen flow rate due to COVID-19  Is within 10 days of symptom onset  Has at least one of the high risk factor(s) for progression to severe COVID-19 and/or hospitalization as defined in EUA.  Specific high risk criteria : COPD   I have spoken and communicated the following to the patient or parent/caregiver:  1. FDA has authorized the emergency use of bamlanivimab and casirivimab\imdevimab for the treatment of mild to moderate COVID-19 in adults and pediatric patients with positive results of direct SARS-CoV-2 viral testing who are 1 years of age and older weighing at least 40 kg, and who are at high risk for progressing to severe COVID-19 and/or hospitalization.  2. The significant known and potential risks and benefits of bamlanivimab and casirivimab\imdevimab, and the extent to which such potential risks and benefits are unknown.  3. Information on available alternative treatments and the risks and benefits of those alternatives, including clinical trials.  4. Patients treated with bamlanivimab and casirivimab\imdevimab should continue to self-isolate and use infection control measures (e.g., wear mask, isolate, social distance, avoid sharing personal items, clean and disinfect "high touch" surfaces, and frequent handwashing) according to CDC guidelines.   5. The patient or parent/caregiver has the option to accept or refuse  bamlanivimab or casirivimab\imdevimab .  After reviewing this information with the patient, The patient agreed to proceed with receiving the bamlanimivab infusion and will be provided a copy of the Fact sheet prior to receiving the infusion.Fenton Foy 01/24/2019 7:57 AM

## 2019-01-25 ENCOUNTER — Ambulatory Visit (HOSPITAL_COMMUNITY)
Admission: RE | Admit: 2019-01-25 | Discharge: 2019-01-25 | Disposition: A | Discharge status: Home / Self Care | Payer: Medicare Other | Source: Ambulatory Visit | Attending: Pulmonary Disease | Admitting: Pulmonary Disease

## 2019-01-25 DIAGNOSIS — Z23 Encounter for immunization: Secondary | ICD-10-CM | POA: Diagnosis not present

## 2019-01-25 DIAGNOSIS — U071 COVID-19: Secondary | ICD-10-CM | POA: Diagnosis not present

## 2019-01-25 DIAGNOSIS — J441 Chronic obstructive pulmonary disease with (acute) exacerbation: Secondary | ICD-10-CM | POA: Diagnosis not present

## 2019-01-25 MED ORDER — EPINEPHRINE 0.3 MG/0.3ML IJ SOAJ: 0.3000 mg | Freq: Once | INTRAMUSCULAR | Status: DC | PRN

## 2019-01-25 MED ORDER — METHYLPREDNISOLONE SODIUM SUCC 125 MG IJ SOLR: 125.0000 mg | Freq: Once | INTRAMUSCULAR | Status: DC | PRN

## 2019-01-25 MED ORDER — SODIUM CHLORIDE 0.9 % IV SOLN
700.0000 mg | Freq: Once | INTRAVENOUS | Status: AC
  Administered 2019-01-25: 700 mg via INTRAVENOUS
  Filled 2019-01-25: qty 20

## 2019-01-25 MED ORDER — SODIUM CHLORIDE 0.9 % IV SOLN
INTRAVENOUS | Status: DC | PRN
  Administered 2019-01-25: 250 mL via INTRAVENOUS

## 2019-01-25 MED ORDER — ALBUTEROL SULFATE HFA 108 (90 BASE) MCG/ACT IN AERS: 2.0000 | INHALATION_SPRAY | Freq: Once | RESPIRATORY_TRACT | Status: DC | PRN

## 2019-01-25 MED ORDER — FAMOTIDINE IN NACL 20-0.9 MG/50ML-% IV SOLN: 20.0000 mg | Freq: Once | INTRAVENOUS | Status: DC | PRN

## 2019-01-25 MED ORDER — DIPHENHYDRAMINE HCL 50 MG/ML IJ SOLN: 50.0000 mg | Freq: Once | INTRAMUSCULAR | Status: DC | PRN

## 2019-01-25 NOTE — Telephone Encounter (Signed)
01/25/2019 1055  Thank you hope the infusion goes well today.  [name], FNP

## 2019-01-25 NOTE — Progress Notes (Signed)
  Diagnosis: COVID-19  Physician: Dr. Joya Gaskins  Procedure: Covid Infusion Clinic Med: bamlanivimab infusion - Provided patient with bamlanimivab fact sheet for patients, parents and caregivers prior to infusion.  Complications: No immediate complications noted.  Discharge: Discharged home   [facility] 01/25/2019

## 2019-01-28 ENCOUNTER — Other Ambulatory Visit: Payer: Self-pay

## 2019-01-28 ENCOUNTER — Ambulatory Visit
Admission: EM | Admit: 2019-01-28 | Discharge: 2019-01-28 | Disposition: A | Discharge status: Home / Self Care | Payer: PPO | Attending: Family Medicine | Admitting: Family Medicine

## 2019-01-28 ENCOUNTER — Ambulatory Visit: Payer: PPO

## 2019-01-28 DIAGNOSIS — U071 COVID-19: Secondary | ICD-10-CM | POA: Diagnosis not present

## 2019-01-28 DIAGNOSIS — M546 Pain in thoracic spine: Secondary | ICD-10-CM | POA: Diagnosis not present

## 2019-01-28 NOTE — ED Triage Notes (Signed)
Patient states that he was diagnosed with Covid on Monday by pulmonology, received immunotherapy on Tuesday this week. Patient states that he is still feeling achy and wants to make sure he hasn't got pneumonia. States that pain is mostly in back. Still without taste and smell and hasn't really been eating.

## 2019-01-28 NOTE — ED Provider Notes (Signed)
[facility]    CSN: [id number] Arrival date & time: 01/28/19  1232      History   Chief Complaint Chief Complaint  Patient presents with  . covid positive  . Cough    HPI Antonio [name] is a 73 y.o. male.   73 yo male with a c/o continuing cough since diagnosis of covid 5 days ago. Received immunotherapy 4 days ago. States he's concerned about possible pneumonia.    Cough   Past Medical History:  Diagnosis Date  . Allergy   . Calf pain   . COPD (chronic obstructive pulmonary disease) (HCC)    no Oxygen per pt  . Dyspnea   . Hypokalemia   . Internal hemorrhoids   . Muscle cramp   . Muscle pain   . Pneumonia   . PONV (postoperative nausea and vomiting)   . Tubular adenoma of colon   . Ulcerative colitis [facility])     Patient Active Problem List   Diagnosis Date Noted  . Close exposure to COVID-19 virus 01/20/2019  . Severe persistent asthma without complication 19/37/9024  . Medication management 09/20/2018  . Chronic obstructive airway disease ([place]) 09/20/2018  . 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 Diffusion capacity of lung (dl), decreased 03/11/2018  . COPD with acute exacerbation ([place]) 06/22/2017  . Ulcerative colitis with complication ([place]) 09/73/5329  . Cervical stenosis of spinal canal 05/05/2016  . Carotid stenosis 05/02/2016  . Atherosclerosis of native arteries of extremity with intermittent claudication ([place]) 05/02/2016  . Diarrhea 06/09/2014  . Centrilobular Emphysema 04/25/2014  . Former smoker 04/25/2014    Past Surgical History:  Procedure Laterality Date  . ANTERIOR CERVICAL DECOMP/DISCECTOMY FUSION N/A 05/05/2016   Procedure: ANTERIOR CERVICAL DECOMPRESSION FUSION CERVICAL THREE-FOUR.;  Surgeon: Earnie Larsson, MD;  Location: [place];  Service: Neurosurgery;  Laterality: N/A;  right side approach  . CERVICAL DISCECTOMY  04/2016   C3-C4  . COLONOSCOPY    . COLONOSCOPY  08/09/2015  . [place]  .  leg stent Left approx 5-6 yrs ago   2 to left leg  . MENISCUS REPAIR Left 12/2013  . POLYPECTOMY    . ROTATOR CUFF REPAIR Left 2007  . TONSILLECTOMY AND ADENOIDECTOMY  1957       Home Medications    Prior to Admission medications   Medication Sig Start Date End Date Taking? Authorizing Provider  albuterol (PROVENTIL) (2.5 MG/3ML) 0.083% nebulizer solution Take 3 mLs (2.5 mg total) by nebulization every 6 (six) hours as needed for wheezing or shortness of breath. 02/01/18  Yes [name], MD  albuterol (VENTOLIN HFA) 108 (90 Base) MCG/ACT inhaler INHALE 2 PUFFS INTO THE LUNGS EVERY 6 HOURS AS NEEDED FOR WHEEZING OR SHORTNESS OF BREATH 06/25/18  Yes Juanito Doom, MD  Ascorbic Acid (VITAMIN C PO) Take by mouth.   Yes [provider]  cetirizine (ZYRTEC) 10 MG tablet Take 10 mg by mouth daily.   Yes [provider]  dupilumab (DUPIXENT) 200 MG/1.14ML prefilled syringe Inject 200 mg into the skin once. [place]   Yes [provider]  ipratropium-albuterol (DUONEB) 0.5-2.5 (3) MG/3ML SOLN USE 3 ML VIA NEBULIZER EVERY 6 HOURS Patient taking differently: 3 mLs every 6 (six) hours as needed. USE 3 ML VIA NEBULIZER EVERY 6 HOURS 04/28/18  Yes Juanito Doom, MD  loperamide (IMODIUM) 2 MG capsule Take 2 mg by mouth daily as needed for diarrhea or loose stools.    Yes  [provider]  MAGNESIUM PO Take by mouth.   Yes [provider]  mesalamine (LIALDA) 1.2 g EC tablet TAKE 4 TABLETS BY MOUTH DAILY WITH BREAKFAST 08/24/18  Yes Pyrtle, Lajuan Lines, MD  Multiple Vitamins-Minerals (ZINC PO) Take by mouth.   Yes [provider]  predniSONE (DELTASONE) 10 MG tablet Take 2 tablets (70m total) daily for the next 5 days. Take in the AM with food. 01/20/19  Yes MLauraine Rinne NP  Tiotropium Bromide Monohydrate (SPIRIVA RESPIMAT) 2.5 MCG/ACT AERS Inhale 2 puffs into the lungs daily. 08/10/18  Yes MLauraine Rinne NP  VITAMIN D PO Take by mouth.   Yes  [provider]  azithromycin (ZITHROMAX) 250 MG tablet 5083m(two tablets) today, then 25052m1 tablet) for the next 4 days 01/20/19   MacLauraine RinneP  valACYclovir (VALTREX) 500 MG tablet Take 1 tablet (500 mg total) by mouth 2 (two) times daily. 12/13/18   JonJuline PatchD    Family History Family History  Problem Relation Age of Onset  . Cancer Mother        leukemia  . Colon polyps Mother   . Heart disease Father   . Heart attack Father   . Healthy Daughter   . Healthy Son   . Colon cancer Neg Hx   . Esophageal cancer Neg Hx   . Rectal cancer Neg Hx   . Stomach cancer Neg Hx     Social History Social History   Tobacco Use  . Smoking status: Former Smoker    Packs/day: 0.75    Years: 35.00    Pack years: 26.25    Types: Cigarettes, Cigars    Quit date: 06/11/2011    Years since quitting: 7.6  . Smokeless tobacco: Never Used  . Tobacco comment: smoking cessation materials not required  Substance Use Topics  . Alcohol use: Yes    Alcohol/week: 7.0 standard drinks    Types: 7 Standard drinks or equivalent per week    Comment: 1-2 day   . Drug use: No     Allergies   Penicillins   Review of Systems Review of Systems  Respiratory: Positive for cough.      Physical Exam Triage Vital Signs ED Triage Vitals  Enc Vitals Group     BP 01/28/19 1305 (!) 115/92     Pulse Rate 01/28/19 1305 75     Resp 01/28/19 1305 18     Temp 01/28/19 1305 97.8 F (36.6 C)     Temp Source 01/28/19 1305 Oral     SpO2 01/28/19 1305 94 %     Weight 01/28/19 1304 153 lb (69.4 kg)     Height 01/28/19 1304 5' 11"  (1.803 m)     Head Circumference --      Peak Flow --      Pain Score 01/28/19 1304 2     Pain Loc --      Pain Edu? --      Excl. in GC?[place]-    No data found.  Updated Vital Signs BP (!) 115/92 (BP Location: Left Arm)   Pulse 75   Temp 97.8 F (36.6 C) (Oral)   Resp 18   Ht 5' 11"  (1.803 m)   Wt 69.4 kg   SpO2 94%   BMI 21.34 kg/m   Visual  Acuity Right Eye Distance:   Left Eye Distance:   Bilateral Distance:    Right Eye Near:   Left  Eye Near:    Bilateral Near:     Physical Exam Vitals and nursing note reviewed.  Constitutional:      General: He is not in acute distress.    Appearance: He is not toxic-appearing or diaphoretic.  Cardiovascular:     Rate and Rhythm: Normal rate.  Pulmonary:     Effort: Pulmonary effort is normal. No respiratory distress.     Breath sounds: Normal breath sounds. No stridor. No wheezing, rhonchi or rales.  Neurological:     Mental Status: He is alert.      UC Treatments / Results  Labs (all labs ordered are listed, but only abnormal results are displayed) Labs Reviewed - No data to display  EKG   Radiology DG Chest 2 View  Result Date: 01/28/2019 CLINICAL DATA:  COVID-19 positive this week with generalize aches and loss of taste and smell. Mid to upper back pain. EXAM: CHEST - 2 VIEW COMPARISON:  01/10/2018 FINDINGS: Lungs are adequately inflated without focal airspace consolidation or effusion. Mild flattening of the hemidiaphragms on the lateral film. Cardiomediastinal silhouette and remainder of the exam is unchanged. IMPRESSION: No active cardiopulmonary disease. Electronically Signed   By: Marin Olp M.D.   On: 01/28/2019 13:17    Procedures Procedures (including critical care time)  Medications Ordered in UC Medications - No data to display  Initial Impression / Assessment and Plan / UC Course  I have reviewed the triage vital signs and the nursing notes.  Pertinent labs & imaging results that were available during my care of the patient were reviewed by me and considered in my medical decision making (see chart for details).      Final Clinical Impressions(s) / UC Diagnoses   Final diagnoses:  [id number] virus infection     Discharge Instructions     Continue supportive treatment    ED Prescriptions    None      1. Chest x-ray results  (negative/normal) and diagnosis reviewed with patient 2. Recommend supportive treatment  3. Go to Emergency Department if symptoms worsen 4. Follow-up prn  PDMP not reviewed this encounter.   Norval Gable, MD 01/28/19 [phone]

## 2019-01-28 NOTE — Discharge Instructions (Addendum)
Continue supportive treatment

## 2019-01-30 NOTE — Progress Notes (Signed)
Virtual Visit via Telephone Note  I connected with Antonio [name] on 01/31/19 at  9:30 AM EST by telephone and verified that I am speaking with the correct person using two identifiers.  Location: Patient: Home Provider: [facility] - [address], [place] [address], [place], [place] [address]   I discussed the limitations, risks, security and privacy concerns of performing an evaluation and management service by telephone and the availability of in person appointments. I also discussed with the patient that there may be a patient responsible charge related to this service. The patient expressed understanding and agreed to proceed.  Patient consented to consult via telephone: Yes People present and their role in pt care: Pt    History of Present Illness:  73 year old male former smoker followed in our office for severe COPD with emphysema  PMH: CAD, arthrosclerosis Smoker/ Smoking History: Former smoker quit 2013.  26.25 pack years Maintenance: Dulera 200, Spiriva 2.5,  Dupixent  Pt of: Dr. 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  Patient was exposed to COVID-19 at a birthday party with family members.  Patient contacted our office complaining televisit which prompted the patient to be tested for SARS-CoV-2.  Patient's result came back positive.  Thankfully patient was able to receive the monoclonal antibody infusion last week.  Patient completed a 1 week telephonic follow-up with our office today.  Since last week of the patient he was seen in the urgent care setting on 01/28/2019.  Patient had a chest x-ray completed that time.  Chest x-ray showed no active cardiopulmonary disease.  Patient completing telephonic visit with our office today.  He reports that he is feeling better.  He continues to have no sense of smell or  taste.  He does have ongoing fatigue.  He feels that his symptoms are improving daily.  He has not had worsened hypoxemia or increased cough or congestion.  He continues to have some clear phlegm.  His wife who was also symptomatic and we suspect had a false negative test reports that her symptoms are improving as well.  Patient son who is positive for Covid and likely the exposure to the patient has improved and they are now out of quarantine.  Patient continues to use his inhalers as prescribed.  Observations/Objective:  06/09/2014 pulmonary function test> ratio 51%, FEV1 1.47 L (40% predicted), total lung capacity 5.43 L (71% predicted). DLCO 16.26 (44% predicted)  December 2017 lung function testing showed ratio 50%, FEV1 1.47 L 43% predicted, FVC 2.96 L 67% predicted  March 2020 pulmonary function testing ratio 38%, FEV1 0.97 L 31% predicted, total lung capacity 8.44 L 119% predicted, residual volume 198% predicted, DLCO 14.0 55% predicted  March 2020 high-resolution CT scan of the chest images : Centrilobular emphysema, no interstitial lung disease, aortic atherosclerosis  04/20/2018-eosinophils relative 7.3, eosinophils absolute 0.6 04/20/2018-IgE-67  01/20/2019-SARS-CoV-2-detected  Social History   Tobacco Use  Smoking Status Former Smoker  . Packs/day: 0.75  . Years: 35.00  . Pack years: 26.25  . Types: Cigarettes, Cigars  . Quit date: 06/11/2011  . Years since quitting: 7.6  Smokeless Tobacco Never Used  Tobacco Comment   smoking cessation materials not required   Immunization History  Administered Date(s) Administered  . Influenza Inj Mdck Quad Pf 10/28/2017  . Influenza Split 11/24/2013  . Influenza, High Dose Seasonal PF 11/05/2016, 10/29/2017,  10/06/2018  . Influenza,inj,Quad PF,6+ Mos 11/08/2014, 11/26/2015  . Influenza-Unspecified 10/11/2013, 10/11/2013  . Pneumococcal Conjugate-13 04/18/2014  . Pneumococcal Polysaccharide-23 11/26/2015  . Tdap 02/13/2011  .  Zoster 11/07/2010  . Zoster Recombinat (Shingrix) 12/01/2016, 03/02/2017      Assessment and Plan:  Centrilobular Emphysema Plan: Continue Dulera 200 Continue Spiriva Respimat 2.5 Continue duo nebs as needed Continue Dupixent  Increase daily physical activity Follow-up with our office in 4 weeks with chest x-ray   Severe persistent asthma without complication Plan: Continue Dulera Continue Dupixent Continue Spiriva Respimat 2.5 4 week follow up in office   COVID-19 virus infection Tested positive for SARS-CoV-2 earlier this month Status post monoclonal antibody infusion Chest x-ray 01/28/2019 - for acute concerns  Plan: Continue to practice hand hygiene, wear mask, practice physical distancing Continue to monitor symptoms  We will get x-ray at next office visit to compare to urgent care x-ray If x-ray starts to show acute infiltrates or concerns for scarring will obtain high-resolution CT chest as well as repeat pulmonary function testing     Follow Up Instructions:  Return in about 4 weeks (around 02/28/2019), or if symptoms worsen or fail to improve, for Follow up with [name] FNP-C, With Chest Xray.   I discussed the assessment and treatment plan with the patient. The patient was provided an opportunity to ask questions and all were answered. The patient agreed with the plan and demonstrated an understanding of the instructions.   The patient was advised to call back or seek an in-person evaluation if the symptoms worsen or if the condition fails to improve as anticipated.  I provided 25 minutes of non-face-to-face time during this encounter.   Lauraine Rinne, NP

## 2019-01-31 ENCOUNTER — Ambulatory Visit (INDEPENDENT_AMBULATORY_CARE_PROVIDER_SITE_OTHER): Payer: PPO | Admitting: Pulmonary Disease

## 2019-01-31 ENCOUNTER — Encounter: Payer: Self-pay | Admitting: Pulmonary Disease

## 2019-01-31 ENCOUNTER — Other Ambulatory Visit: Payer: Self-pay

## 2019-01-31 DIAGNOSIS — J449 Chronic obstructive pulmonary disease, unspecified: Secondary | ICD-10-CM | POA: Diagnosis not present

## 2019-01-31 DIAGNOSIS — U071 COVID-19: Secondary | ICD-10-CM

## 2019-01-31 DIAGNOSIS — J4550 Severe persistent asthma, uncomplicated: Secondary | ICD-10-CM

## 2019-01-31 NOTE — Assessment & Plan Note (Signed)
Plan: Continue Dulera 200 Continue Spiriva Respimat 2.5 Continue duo nebs as needed Continue Dupixent  Increase daily physical activity Follow-up with our office in 4 weeks with chest x-ray

## 2019-01-31 NOTE — Patient Instructions (Addendum)
You were seen today by Lauraine Rinne, NP  for:   1. COVID-19 virus infection  - DG Chest 2 View; Future  Follow-up with our office in 4 weeks with chest xray  2. Severe persistent asthma without complication  Continue Dupixent  Continue Dulera 200  Continue Spiriva Respimat 2.5  3. Centrilobular Emphysema  - DG Chest 2 View; Future   We recommend today:  Orders Placed This Encounter  Procedures  . DG Chest 2 View    Standing Status:   Future    Standing Expiration Date:   04/02/2020    Order Specific Question:   Reason for Exam (SYMPTOM  OR DIAGNOSIS REQUIRED)    Answer:   s/p covid    Order Specific Question:   Preferred imaging location?    Answer:   Internal    Order Specific Question:   Radiology Contrast Protocol - do NOT remove file path    Answer:   [email or web address]   Orders Placed This Encounter  Procedures  . DG Chest 2 View   No orders of the defined types were placed in this encounter.   Follow Up:    Return in about 4 weeks (around 02/28/2019), or if symptoms worsen or fail to improve, for Follow up with [name] FNP-C, With Chest Xray.   Please do your part to reduce the spread of COVID-19:      Reduce your risk of any infection  and COVID19 by using the similar precautions used for avoiding the common cold or flu:  Marland Kitchen Wash your hands often with soap and warm water for at least 20 seconds.  If soap and water are not readily available, use an alcohol-based hand sanitizer with at least 60% alcohol.  . If coughing or sneezing, cover your mouth and nose by coughing or sneezing into the elbow areas of your shirt or coat, into a tissue or into your sleeve (not your hands). Langley Gauss A MASK when in public  . Avoid shaking hands with others and consider head nods or verbal greetings only. . Avoid touching your eyes, nose, or mouth with unwashed hands.  . Avoid close contact with people who are sick. . Avoid places or  events with large numbers of people in one location, like concerts or sporting events. . If you have some symptoms but not all symptoms, continue to monitor at home and seek medical attention if your symptoms worsen. . If you are having a medical emergency, call 911.   [place] / e-Visit: [email or web address]         [facility]: [facility]: [phone]                   [facility]: [phone]     It is flu season:   >>> Best ways to protect herself from the flu: Receive the yearly flu vaccine, practice good hand hygiene washing with soap and also using hand sanitizer when available, eat a nutritious meals, get adequate rest, hydrate appropriately   Please contact the office if your symptoms worsen or you have concerns that you are not improving.   Thank you for choosing [facility] for your healthcare, and for allowing [place] to partner with you on your healthcare journey. I am thankful to be able to provide care to you today.   [name] FNP-C

## 2019-01-31 NOTE — Assessment & Plan Note (Signed)
Plan: Continue Dulera Continue Dupixent Continue Spiriva Respimat 2.5 4 week follow up in office

## 2019-01-31 NOTE — Assessment & Plan Note (Addendum)
Tested positive for SARS-CoV-2 earlier this month Status post monoclonal antibody infusion Chest x-ray 01/28/2019 - for acute concerns  Plan: Continue to practice hand hygiene, wear mask, practice physical distancing Continue to monitor symptoms  We will get x-ray at next office visit to compare to urgent care x-ray If x-ray starts to show acute infiltrates or concerns for scarring will obtain high-resolution CT chest as well as repeat pulmonary function testing

## 2019-03-03 ENCOUNTER — Encounter: Payer: Self-pay | Admitting: Pulmonary Disease

## 2019-03-03 ENCOUNTER — Ambulatory Visit: Payer: PPO | Admitting: Pulmonary Disease

## 2019-03-03 ENCOUNTER — Other Ambulatory Visit: Payer: Self-pay

## 2019-03-03 ENCOUNTER — Telehealth: Payer: Self-pay | Admitting: Pulmonary Disease

## 2019-03-03 ENCOUNTER — Ambulatory Visit (INDEPENDENT_AMBULATORY_CARE_PROVIDER_SITE_OTHER): Payer: PPO

## 2019-03-03 VITALS — BP 136/58 | HR 75 | Temp 98.7°F | Ht 71.0 in | Wt 148.8 lb

## 2019-03-03 DIAGNOSIS — Z79899 Other long term (current) drug therapy: Secondary | ICD-10-CM | POA: Diagnosis not present

## 2019-03-03 DIAGNOSIS — J432 Centrilobular emphysema: Secondary | ICD-10-CM

## 2019-03-03 DIAGNOSIS — J449 Chronic obstructive pulmonary disease, unspecified: Secondary | ICD-10-CM

## 2019-03-03 DIAGNOSIS — Z87891 Personal history of nicotine dependence: Secondary | ICD-10-CM

## 2019-03-03 DIAGNOSIS — J4550 Severe persistent asthma, uncomplicated: Secondary | ICD-10-CM | POA: Diagnosis not present

## 2019-03-03 DIAGNOSIS — U071 COVID-19: Secondary | ICD-10-CM

## 2019-03-03 NOTE — Progress Notes (Signed)
@Patient  ID: Antonio [name], male    DOB: 16-May-1945, 74 y.o.   MRN: [id number]  Chief Complaint  Patient presents with  . Follow-up    SOB    Referring provider: [name], MD  HPI:  74 year old male former smoker followed in our office for severe COPD with emphysema  PMH: CAD, arthrosclerosis Smoker/ Smoking History: Former smoker quit 2013.  26.25 pack years Maintenance: Dulera 200, Spiriva 2.5,  Dupixent  Pt of: Dr. Vaughan Browner   03/03/2019  - Visit   74 year old male former smoker followed in our office for severe COPD with emphysema.  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  Patient is managed on Dulera 200, Spiriva Respimat 2.5 and Dupixent.  Unfortunately patient has not received a Dupixent injection since prior to his Covid diagnosis in December/2020.  Patient was originally supposed to be receiving home injections from his daughter.  He is wondering what the status of his Dupixent prescription is.  He reports that [facility] contacted him and said they are still processing his paperwork.  He is unsure the status of this.  Patient was scheduled to receive a Covid vaccine today 03/03/2019.  Unfortunately he did receive the monoclonal antibody infusion in December on 01/24/2019.  Patient would not be L to receive the Covid vaccine for 90 days after the monoclonal antibody infusion  Patient continues to have aspects of shortness of breath.  Overall though he feels that he is at his baseline.  Tests:   06/09/2014 pulmonary function test> ratio 51%, FEV1 1.47 L (40% predicted), total lung capacity 5.43 L (71% predicted). DLCO 16.26 (44% predicted)  December 2017 lung function testing showed ratio 50%, FEV1 1.47 L 43% predicted, FVC 2.96 L 67% predicted  March 2020 pulmonary function testing ratio 38%, FEV1 0.97 L 31% predicted, total lung capacity 8.44 L 119% predicted, residual volume 198% predicted, DLCO 14.0 55%  predicted  March 2020 high-resolution CT scan of the chest images : Centrilobular emphysema, no interstitial lung disease, aortic atherosclerosis  04/20/2018-eosinophils relative 7.3, eosinophils absolute 0.6 04/20/2018-IgE-67  01/20/2019-SARS-CoV-2-detected  FENO:  No results found for: NITRICOXIDE  PFT: PFT Results Latest Ref Rng & Units 04/20/2018 06/07/2014  FVC-Pre L 2.56 2.80  FVC-Predicted Pre % 60 57  FVC-Post L 2.60 2.90  FVC-Predicted Post % 61 59  Pre FEV1/FVC % % 38 52  Post FEV1/FCV % % 39 51  FEV1-Pre L 0.97 1.46  FEV1-Predicted Pre % 31 40  FEV1-Post L 1.02 1.47  DLCO UNC% % 55 44  DLCO COR %Predicted % 68 55  TLC L 8.44 -  TLC % Predicted % 119 -  RV % Predicted % 198 -    WALK:  SIX MIN WALK 03/11/2018 04/25/2014  Supplimental Oxygen during Test? (L/min) No No  Tech Comments: patient walked at fast pace; winded not short of breath, patient tolerated well  pt walked a moderately fast pace, tolerated walk well.     Imaging: DG Chest 2 View  Result Date: 03/03/2019 CLINICAL DATA:  COPD EXAM: CHEST - 2 VIEW COMPARISON:  01/28/2019 FINDINGS: The heart size and mediastinal contours are within normal limits. Hyperexpanded lungs with flattening of the diaphragms. Coarsened interstitial markings bilaterally. No focal consolidation, pleural effusion, or pneumothorax. The visualized skeletal structures are unremarkable. IMPRESSION: 1. No active cardiopulmonary disease. 2. COPD. Electronically Signed   By: Davina Poke D.O.   On: 03/03/2019 14:08    Lab Results:  CBC    Component Value Date/Time   WBC 8.6 04/20/2018 1551   RBC 4.71 04/20/2018 1551   HGB 14.9 04/20/2018 1551   HGB 15.2 11/26/2015 0933   HCT 44.0 04/20/2018 1551   HCT 43.9 11/26/2015 0933   PLT 218.0 04/20/2018 1551   PLT 247 11/26/2015 0933   MCV 93.4 04/20/2018 1551   MCV 90 11/26/2015 0933   MCV 95 10/01/2013 2131   MCH 31.0 04/28/2016 1433   MCHC 33.9 04/20/2018 1551   RDW 12.6  04/20/2018 1551   RDW 13.6 11/26/2015 0933   RDW 12.4 10/01/2013 2131   LYMPHSABS 2.0 04/20/2018 1551   MONOABS 1.3 (H) 04/20/2018 1551   EOSABS 0.6 04/20/2018 1551   BASOSABS 0.1 04/20/2018 1551    BMET    Component Value Date/Time   NA 139 12/13/2018 0945   NA 138 10/01/2013 2131   K 4.5 12/13/2018 0945   K 4.2 10/01/2013 2131   CL 101 12/13/2018 0945   CL 107 10/01/2013 2131   CO2 25 12/13/2018 0945   CO2 23 10/01/2013 2131   GLUCOSE 106 (H) 12/13/2018 0945   GLUCOSE 72 05/28/2016 1450   GLUCOSE 118 (H) 10/01/2013 2131   BUN 9 12/13/2018 0945   BUN 11 10/01/2013 2131   CREATININE 1.08 12/13/2018 0945   CREATININE 0.93 10/01/2013 2131   CALCIUM 9.8 12/13/2018 0945   CALCIUM 8.7 10/01/2013 2131   GFRNONAA 67 12/13/2018 0945   GFRNONAA >60 10/01/2013 2131   GFRAA 77 12/13/2018 0945   GFRAA >60 10/01/2013 2131    BNP No results found for: BNP  ProBNP No results found for: PROBNP  Specialty Problems      Pulmonary Problems   Centrilobular Emphysema    Noted on 04/2014 CXR 06/09/2014 pulmonary function test> ratio 51%, FEV1 1.47 L (40% predicted), total lung capacity 5.43 L (71% predicted). 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 Penicillins Rash    Has patient had a PCN reaction causing immediate rash, facial/tongue/throat swelling, SOB or lightheadedness with hypotension:Yes Has patient had a PCN reaction causing severe rash involving mucus membranes or skin necrosis:Yes Has patient had a PCN reaction that required hospitalization:No Has patient had a PCN reaction occurring within the last 10 years:No If all of the above answers are "NO", then may proceed with Cephalosporin use.     Immunization History  Administered Date(s) Administered  . Influenza  Inj Mdck Quad Pf 10/28/2017  . Influenza Split 11/24/2013  . Influenza, High Dose Seasonal PF 11/05/2016, 10/29/2017, 10/06/2018  . Influenza,inj,Quad PF,6+ Mos 11/08/2014, 11/26/2015  . Influenza-Unspecified 10/11/2013, 10/11/2013  . Pneumococcal Conjugate-13 04/18/2014  . Pneumococcal Polysaccharide-23 11/26/2015  . Tdap 02/13/2011  . Zoster 11/07/2010  . Zoster Recombinat (Shingrix) 12/01/2016, 03/02/2017    Past Medical History:  Diagnosis Date  . Allergy   . Calf pain   . COPD (chronic obstructive pulmonary disease) (HCC)    no Oxygen per pt  . Dyspnea   . Hypokalemia   . Internal hemorrhoids   . Muscle cramp   . Muscle pain   . Pneumonia   . PONV (postoperative nausea and vomiting)   . Tubular adenoma of colon   . Ulcerative colitis ([place])     Tobacco History: Social History  Tobacco Use  Smoking Status Former Smoker  . Packs/day: 0.75  . Years: 35.00  . Pack years: 26.25  . Types: Cigarettes, Cigars  . Quit date: 06/11/2011  . Years since quitting: 7.7  Smokeless Tobacco Never Used  Tobacco Comment   smoking cessation materials not required   Counseling given: Yes Comment: smoking cessation materials not required   Continue to not smoke  Outpatient Encounter Medications as of 03/03/2019  Medication Sig  . albuterol (PROVENTIL) (2.5 MG/3ML) 0.083% nebulizer solution Take 3 mLs (2.5 mg total) by nebulization every 6 (six) hours as needed for wheezing or shortness of breath.  Marland Kitchen albuterol (VENTOLIN HFA) 108 (90 Base) MCG/ACT inhaler INHALE 2 PUFFS INTO THE LUNGS EVERY 6 HOURS AS NEEDED FOR WHEEZING OR SHORTNESS OF BREATH  . Ascorbic Acid (VITAMIN C PO) Take by mouth.  . cetirizine (ZYRTEC) 10 MG tablet Take 10 mg by mouth daily.  . dupilumab (DUPIXENT) 200 MG/1.14ML prefilled syringe Inject 200 mg into the skin once. [place]  . ipratropium-albuterol (DUONEB) 0.5-2.5 (3) MG/3ML SOLN USE 3 ML VIA NEBULIZER EVERY 6 HOURS (Patient taking differently: 3 mLs every 6  (six) hours as needed. USE 3 ML VIA NEBULIZER EVERY 6 HOURS)  . loperamide (IMODIUM) 2 MG capsule Take 2 mg by mouth daily as needed for diarrhea or loose stools.   Marland Kitchen MAGNESIUM PO Take by mouth.  . mesalamine (LIALDA) 1.2 g EC tablet TAKE 4 TABLETS BY MOUTH DAILY WITH BREAKFAST  . Multiple Vitamins-Minerals (ZINC PO) Take by mouth.  . Tiotropium Bromide Monohydrate (SPIRIVA RESPIMAT) 2.5 MCG/ACT AERS Inhale 2 puffs into the lungs daily.  Marland Kitchen VITAMIN D PO Take by mouth.  . [DISCONTINUED] azithromycin (ZITHROMAX) 250 MG tablet 540m (two tablets) today, then 2549m(1 tablet) for the next 4 days  . [DISCONTINUED] predniSONE (DELTASONE) 10 MG tablet Take 2 tablets (2039motal) daily for the next 5 days. Take in the AM with food.  . [DISCONTINUED] valACYclovir (VALTREX) 500 MG tablet Take 1 tablet (500 mg total) by mouth 2 (two) times daily.   Facility-Administered Encounter Medications as of 03/03/2019  Medication  . 0.9 %  sodium chloride infusion     Review of Systems  Review of Systems  Constitutional: Positive for fatigue. Negative for activity change, chills, fever and unexpected weight change.  HENT: Negative for rhinorrhea, sinus pressure, sinus pain and sore throat.   Eyes: Negative.   Respiratory: Positive for shortness of breath. Negative for cough and wheezing.   Cardiovascular: Negative for chest pain and palpitations.  Endocrine: Negative.   Genitourinary: Negative.   Musculoskeletal: Negative.   Skin: Negative.   Neurological: Negative for dizziness and headaches.  Psychiatric/Behavioral: Negative.  Negative for dysphoric mood. The patient is not nervous/anxious.   All other systems reviewed and are negative.    Physical Exam  BP (!) 136/58 (BP Location: Left Arm, Cuff Size: Normal)   Pulse 75   Temp 98.7 F (37.1 C) (Temporal)   Ht 5' 11"  (1.803 m)   Wt 148 lb 12.8 oz (67.5 kg)   SpO2 95% Comment: RA  BMI 20.75 kg/m   Wt Readings from Last 5 Encounters:   03/03/19 148 lb 12.8 oz (67.5 kg)  01/28/19 153 lb (69.4 kg)  12/29/18 152 lb 12.8 oz (69.3 kg)  12/23/18 152 lb (68.9 kg)  12/13/18 152 lb (68.9 kg)    BMI Readings from Last 5 Encounters:  03/03/19 20.75 kg/m  01/28/19 21.34 kg/m  12/29/18 21.31 kg/m  12/23/18  21.20 kg/m  12/13/18 21.20 kg/m     Physical Exam Vitals and nursing note reviewed.  Constitutional:      General: He is not in acute distress.    Appearance: Normal appearance. He is normal weight.  HENT:     Head: Normocephalic and atraumatic.     Right Ear: Hearing, tympanic membrane, ear canal and external ear normal.     Left Ear: Hearing, tympanic membrane, ear canal and external ear normal.     Nose: Nose normal. No mucosal edema or rhinorrhea.     Right Turbinates: Not enlarged.     Left Turbinates: Not enlarged.     Mouth/Throat:     Mouth: Mucous membranes are dry.     Pharynx: Oropharynx is clear. No oropharyngeal exudate.  Eyes:     Pupils: Pupils are equal, round, and reactive to light.  Cardiovascular:     Rate and Rhythm: Normal rate and regular rhythm.     Pulses: Normal pulses.     Heart sounds: Normal heart sounds. No murmur.  Pulmonary:     Effort: Pulmonary effort is normal. No respiratory distress.     Breath sounds: No decreased breath sounds, wheezing or rales.  Musculoskeletal:     Cervical back: Normal range of motion.  Lymphadenopathy:     Cervical: No cervical adenopathy.  Skin:    General: Skin is warm and dry.     Capillary Refill: Capillary refill takes less than 2 seconds.     Findings: No erythema or rash.  Neurological:     General: No focal deficit present.     Mental Status: He is alert and oriented to person, place, and time.     Motor: No weakness.     Coordination: Coordination normal.     Gait: Gait is intact. Gait normal.  Psychiatric:        Mood and Affect: Mood normal.        Behavior: Behavior normal. Behavior is cooperative.        Thought Content:  Thought content normal.        Judgment: Judgment normal.       Assessment & Plan:   Centrilobular Emphysema Plan: Continue Dulera 200 Continue Spiriva Respimat 2.5 Continue duo nebs as needed Continue Dupixent  Increase daily physical activity   Severe persistent asthma without complication Plan: Continue Dulera Continue Dupixent Continue Spiriva Respimat 2.5  COVID-19 virus infection Tested positive for SARS-CoV-2 earlier this month Status post monoclonal antibody infusion Chest x-ray 01/28/2019 - for acute concerns  Plan: Continue to practice hand hygiene, wear mask, practice physical distancing Continue to monitor symptoms Chest x-ray today clear Patient will have to receive Covid vaccine 1 to 90 days out from monoclonal antibody infusion      Medication management Plan: I will follow up with the biologic team to see what the status of your Dupixent injections are.    Return in about 2 months (around 05/01/2019), or if symptoms worsen or fail to improve, for Follow up with Dr. Vaughan Browner.   Lauraine Rinne, NP 03/03/2019   This appointment required 32 minutes of patient care (this includes precharting, chart review, review of results, face-to-face care, etc.).

## 2019-03-03 NOTE — Assessment & Plan Note (Signed)
Tested positive for SARS-CoV-2 earlier this month Status post monoclonal antibody infusion Chest x-ray 01/28/2019 - for acute concerns  Plan: Continue to practice hand hygiene, wear mask, practice physical distancing Continue to monitor symptoms Chest x-ray today clear Patient will have to receive Covid vaccine 1 to 90 days out from monoclonal antibody infusion

## 2019-03-03 NOTE — Patient Instructions (Addendum)
You were seen today by Lauraine Rinne, NP  for:   1. COVID-19 virus infection  - DG Chest 2 View; Future  Chest x-ray today stable  Since she received the monoclonal antibody infusion you would not be able to receive the Covid vaccine for 90 days, anytime after March/15/2021 should be okay  2. Centrilobular Emphysema  Dulera 200 >>> 2 puffs in the morning right when you wake up, rinse out your mouth after use, 12 hours later 2 puffs, rinse after use >>> Take this daily, no matter what >>> This is not a rescue inhaler   Spiriva Respimat 2.5 >>> 2 puffs daily >>> Do this every day >>>This is not a rescue inhaler    Note your daily symptoms > remember "red flags" for COPD:   >>>Increase in cough >>>increase in sputum production >>>increase in shortness of breath or activity  intolerance.   If you notice these symptoms, please call the office to be seen.   Continue Dupixent   3. Severe persistent asthma without complication  We will work as an office to get you restarted on Dupixent injections  4. Medication management  We will work to get you restarted on Dupixent injections   We recommend today:  Orders Placed This Encounter  Procedures  . DG Chest 2 View    Standing Status:   Future    Number of Occurrences:   1    Standing Expiration Date:   04/30/2020    Order Specific Question:   Reason for Exam (SYMPTOM  OR DIAGNOSIS REQUIRED)    Answer:   copd    Order Specific Question:   Preferred imaging location?    Answer:   Internal    Order Specific Question:   Radiology Contrast Protocol - do NOT remove file path    Answer:   [email or web address]   Orders Placed This Encounter  Procedures  . DG Chest 2 View   No orders of the defined types were placed in this encounter.   Follow Up:    Return in about 2 months (around 05/01/2019), or if symptoms worsen or fail to improve, for Follow up with Dr. Vaughan Browner.   Please do your  part to reduce the spread of COVID-19:      Reduce your risk of any infection  and COVID19 by using the similar precautions used for avoiding the common cold or flu:  Marland Kitchen Wash your hands often with soap and warm water for at least 20 seconds.  If soap and water are not readily available, use an alcohol-based hand sanitizer with at least 60% alcohol.  . If coughing or sneezing, cover your mouth and nose by coughing or sneezing into the elbow areas of your shirt or coat, into a tissue or into your sleeve (not your hands). Langley Gauss A MASK when in public  . Avoid shaking hands with others and consider head nods or verbal greetings only. . Avoid touching your eyes, nose, or mouth with unwashed hands.  . Avoid close contact with people who are sick. . Avoid places or events with large numbers of people in one location, like concerts or sporting events. . If you have some symptoms but not all symptoms, continue to monitor at home and seek medical attention if your symptoms worsen. . If you are having a medical emergency, call 911.   ADDITIONAL HEALTHCARE OPTIONS FOR PATIENTS  [place] [facility] / e-Visit: [email or web address]         [facility]: [facility]: [phone]                   [facility]: [phone]     It is flu season:   >>> Best ways to protect herself from the flu: Receive the yearly flu vaccine, practice good hand hygiene washing with soap and also using hand sanitizer when available, eat a nutritious meals, get adequate rest, hydrate appropriately   Please contact the office if your symptoms worsen or you have concerns that you are not improving.   Thank you for choosing [facility] for your healthcare, and for allowing [place] to partner with you on your healthcare journey. I am thankful to be able to provide care to you today.   [name] FNP-C

## 2019-03-03 NOTE — Telephone Encounter (Signed)
03/03/2019 1635  Patient presented to our office today for a follow-up visit.  He reports he has not heard anything from [facility] regarding his current status and prescription.  Patient was hoping to be transition to home injections.  He was also diagnosed with Covid in December/2020.  Biologics team,  Can we please follow-up with the patient regarding his status.  Potentially with the hold-up with [facility] is that with delaying the patient's injections?  If that is the case could he be considered for a sample?  Or could he receive the injections in our office?  [name], FNP

## 2019-03-03 NOTE — Assessment & Plan Note (Signed)
Plan: Continue Dulera Continue Dupixent Continue Spiriva Respimat 2.5

## 2019-03-03 NOTE — Telephone Encounter (Signed)
Patient was to start home injections. Dupixent my be help up in 2021 patient assistance. Will contact Patient and Threasa Beards to follow up.

## 2019-03-03 NOTE — Assessment & Plan Note (Signed)
Plan: Continue Dulera 200 Continue Spiriva Respimat 2.5 Continue duo nebs as needed Continue Dupixent  Increase daily physical activity

## 2019-03-03 NOTE — Assessment & Plan Note (Signed)
Plan: I will follow up with the biologic team to see what the status of your Dupixent injections are.

## 2019-03-04 NOTE — Telephone Encounter (Signed)
Called [facility] to follow up on Patient's status. I was told they were currently working on Patient's 2021 enrollment.  A fax is to be sent to [facility] for new prescription and additional information later today. Called and spoke with Patient. Patient stated he received insurance approval in this mornings mail.  Patient stated he has spoke with someone at [facility] and is mailing a copy of his 2021 approval. I offered Patient a dupixent sample to cover him for 2 weeks, but Patient declined. Patient stated he will receive his Dupixent shortly. Advised Patient to call office with any problems, concerns, or if he needs Dupixent sample.

## 2019-03-08 NOTE — Telephone Encounter (Signed)
OK noted. Thank you   [name] FNP

## 2019-03-15 ENCOUNTER — Telehealth: Payer: Self-pay | Admitting: Pulmonary Disease

## 2019-03-16 MED ORDER — DUPILUMAB 200 MG/1.14ML ~~LOC~~ SOSY: 200.0000 mg | PREFILLED_SYRINGE | SUBCUTANEOUS | 11 refills | Status: DC

## 2019-03-16 NOTE — Telephone Encounter (Signed)
New Dupixent prescription sent to [facility]. Nothing further at this time.

## 2019-03-18 ENCOUNTER — Other Ambulatory Visit: Payer: Self-pay | Admitting: Pulmonary Disease

## 2019-03-18 MED ORDER — DUPILUMAB 200 MG/1.14ML ~~LOC~~ SOSY: 200.0000 mg | PREFILLED_SYRINGE | SUBCUTANEOUS | 11 refills | Status: DC

## 2019-03-18 NOTE — Telephone Encounter (Signed)
Antonio [name] calling from [employer] to get a script of the [place]. Informed Antonio [name] that one had been filled on 2/3. Script can be faxed to [phone]

## 2019-03-18 NOTE — Telephone Encounter (Signed)
Dupixent prescription prescription sent to [facility].  Nothing further at this time.

## 2019-03-18 NOTE — Telephone Encounter (Signed)
New prescription sent to [facility] for Dupixent 258m/1.14ml prefilled syringe.  Per med list and last OV note, patient is taking Dupixent 2037mhome injections. .Marland Kitchen

## 2019-03-23 NOTE — Telephone Encounter (Signed)
Medication name and strength: Dupixent 212m inj PA approved/denied: Approved  Injection room notified. Patient will be notified

## 2019-03-23 NOTE — Telephone Encounter (Signed)
Dupixent PA has been approved.  Patient is aware of approval.  Dupixent approval faxed to [facility]. Nothing further at this time.

## 2019-03-23 NOTE — Telephone Encounter (Signed)
03/23/2019  Received notification a mail on 03/18/2019 reporting that patient's Dupixent prescription has been approved.  We will route this message to the injection pool.  Can we please follow-up with the patient to ensure that he has received the [place] in the mail as he is doing home injections.  [name], FNP

## 2019-04-06 DIAGNOSIS — M79642 Pain in left hand: Secondary | ICD-10-CM | POA: Diagnosis not present

## 2019-04-06 DIAGNOSIS — M25642 Stiffness of left hand, not elsewhere classified: Secondary | ICD-10-CM | POA: Diagnosis not present

## 2019-04-06 DIAGNOSIS — M79641 Pain in right hand: Secondary | ICD-10-CM | POA: Diagnosis not present

## 2019-04-06 DIAGNOSIS — M24541 Contracture, right hand: Secondary | ICD-10-CM | POA: Diagnosis not present

## 2019-04-29 NOTE — Progress Notes (Signed)
@Patient  ID: Antonio [name], male    DOB: Apr 23, 1945, 74 y.o.   MRN: [id number]  Chief Complaint  Patient presents with  . Follow-up    ACOS follow up, on dupixent     Referring provider: [name], MD  HPI:  74 year old male former smoker followed in our office for severe COPD with emphysema  PMH: CAD, arthrosclerosis Smoker/ Smoking History: Former smoker quit 2013.  26.25 pack years Maintenance: Dulera 200, Spiriva 2.5,  Dupixent  Pt of: Dr. Vaughan Browner   05/02/2019  - Visit   74 year old male former smoker followed in our office today for asthma COPD overlap syndrome.  Patient presenting today as a 27-monthfollow-up.  He has been doing well.  Patient was diagnosed positive with SARS-CoV-2 in December/2020.  He did receive the monoclonal antibody infusion on 01/25/2019.  He was never hospitalized.  Patient is outside of his 90-day window since receiving the monoclonal antibody infusion.  He is actively trying to receive the Covid vaccine.  He remains adherent to his inhalers Dulera 200 and Spiriva Respimat 2.5.   He has successfully restarted Dupixent.  He was receiving this through [facility] [address] and there is a delay in January and February of getting the vaccine shipped to him.  His daughter who is a PA administers this.  Patient reports that he is actively going to church.  He wears a mask into church but he takes it off when he is sitting inside.  We will discuss this today.  Patient reporting increased nasal congestion today with high pollen counts.  He is taking a daily antihistamine.  He is not doing nasal saline rinses.  Questionaires / Pulmonary Flowsheets:   MMRC: mMRC Dyspnea Scale mMRC Score  05/02/2019 2    Tests:   06/09/2014 pulmonary function test> ratio 51%, FEV1 1.47 L (40% predicted), total lung capacity 5.43 L (71% predicted). DLCO 16.26 (44% predicted)  December 2017 lung function testing showed ratio 50%, FEV1 1.47 L 43% predicted, FVC 2.96 L  67% predicted  March 2020 pulmonary function testing ratio 38%, FEV1 0.97 L 31% predicted, total lung capacity 8.44 L 119% predicted, residual volume 198% predicted, DLCO 14.0 55% predicted  March 2020 high-resolution CT scan of the chest images : Centrilobular emphysema, no interstitial lung disease, aortic atherosclerosis  04/20/2018-eosinophils relative 7.3, eosinophils absolute 0.6 04/20/2018-IgE-67  01/20/2019-SARS-CoV-2-detected  FENO:  No results found for: NITRICOXIDE  PFT: PFT Results Latest Ref Rng & Units 04/20/2018 06/07/2014  FVC-Pre L 2.56 2.80  FVC-Predicted Pre % 60 57  FVC-Post L 2.60 2.90  FVC-Predicted Post % 61 59  Pre FEV1/FVC % % 38 52  Post FEV1/FCV % % 39 51  FEV1-Pre L 0.97 1.46  FEV1-Predicted Pre % 31 40  FEV1-Post L 1.02 1.47  DLCO UNC% % 55 44  DLCO COR %Predicted % 68 55  TLC L 8.44 -  TLC % Predicted % 119 -  RV % Predicted % 198 -    WALK:  No flowsheet data found.  Imaging: No results found.  Lab Results:  CBC    Component Value Date/Time   WBC 8.6 04/20/2018 1551   RBC 4.71 04/20/2018 1551   HGB 14.9 04/20/2018 1551   HGB 15.2 11/26/2015 0933   HCT 44.0 04/20/2018 1551   HCT 43.9 11/26/2015 0933   PLT 218.0 04/20/2018 1551   PLT 247 11/26/2015 0933   MCV 93.4 04/20/2018 1551   MCV 90 11/26/2015 0933   MCV 95 10/01/2013  2131   MCH 31.0 04/28/2016 1433   MCHC 33.9 04/20/2018 1551   RDW 12.6 04/20/2018 1551   RDW 13.6 11/26/2015 0933   RDW 12.4 10/01/2013 2131   LYMPHSABS 2.0 04/20/2018 1551   MONOABS 1.3 (H) 04/20/2018 1551   EOSABS 0.6 04/20/2018 1551   BASOSABS 0.1 04/20/2018 1551    BMET    Component Value Date/Time   NA 139 12/13/2018 0945   NA 138 10/01/2013 2131   K 4.5 12/13/2018 0945   K 4.2 10/01/2013 2131   CL 101 12/13/2018 0945   CL 107 10/01/2013 2131   CO2 25 12/13/2018 0945   CO2 23 10/01/2013 2131   GLUCOSE 106 (H) 12/13/2018 0945   GLUCOSE 72 05/28/2016 1450   GLUCOSE 118 (H) 10/01/2013 2131    BUN 9 12/13/2018 0945   BUN 11 10/01/2013 2131   CREATININE 1.08 12/13/2018 0945   CREATININE 0.93 10/01/2013 2131   CALCIUM 9.8 12/13/2018 0945   CALCIUM 8.7 10/01/2013 2131   GFRNONAA 67 12/13/2018 0945   GFRNONAA >60 10/01/2013 2131   GFRAA 77 12/13/2018 0945   GFRAA >60 10/01/2013 2131    BNP No results found for: BNP  ProBNP No results found for: PROBNP  Specialty Problems      Pulmonary Problems   Centrilobular Emphysema    Noted on 04/2014 CXR 06/09/2014 pulmonary function test> ratio 51%, FEV1 1.47 L (40% predicted), total lung capacity 5.43 L (71% predicted). 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 Penicillins Rash    Has patient had a PCN reaction causing immediate rash, facial/tongue/throat swelling, SOB or lightheadedness with hypotension:Yes Has patient had a PCN reaction causing severe rash involving mucus membranes or skin necrosis:Yes Has patient had a PCN reaction that required hospitalization:No Has patient had a PCN reaction occurring within the last 10 years:No If all of the above answers are "NO", then may proceed with Cephalosporin use.     Immunization History  Administered Date(s) Administered  . Influenza Inj Mdck Quad Pf 10/28/2017  . Influenza Split 11/24/2013  . Influenza, High Dose Seasonal PF 11/05/2016, 10/29/2017, 10/06/2018  . Influenza,inj,Quad PF,6+ Mos 11/08/2014, 11/26/2015  . Influenza-Unspecified 10/11/2013, 10/11/2013  . Pneumococcal Conjugate-13 04/18/2014  . Pneumococcal Polysaccharide-23 11/26/2015  . Tdap 02/13/2011  . Zoster 11/07/2010  . Zoster Recombinat (Shingrix) 12/01/2016, 03/02/2017    Past Medical History:  Diagnosis Date  . Allergy   . Calf pain   . COPD (chronic obstructive pulmonary disease) (HCC)    no  Oxygen per pt  . Dyspnea   . Hypokalemia   . Internal hemorrhoids   . Muscle cramp   . Muscle pain   . Pneumonia   . PONV (postoperative nausea and vomiting)   . Tubular adenoma of colon   . Ulcerative colitis ([place])     Tobacco History: Social History   Tobacco Use  Smoking Status Former Smoker  . Packs/day: 0.75  . Years: 35.00  . Pack years: 26.25  . Types: Cigarettes, Cigars  . Quit date: 06/11/2011  . Years since quitting: 7.8  Smokeless Tobacco Never Used  Tobacco Comment   smoking cessation materials not required   Counseling given: Yes Comment: smoking cessation materials not required  Continue to not smoke  Outpatient Encounter Medications  as of 05/02/2019  Medication Sig  . albuterol (PROVENTIL) (2.5 MG/3ML) 0.083% nebulizer solution Take 3 mLs (2.5 mg total) by nebulization every 6 (six) hours as needed for wheezing or shortness of breath.  Marland Kitchen albuterol (VENTOLIN HFA) 108 (90 Base) MCG/ACT inhaler INHALE 2 PUFFS INTO THE LUNGS EVERY 6 HOURS AS NEEDED FOR WHEEZING OR SHORTNESS OF BREATH  . Ascorbic Acid (VITAMIN C PO) Take by mouth.  . cetirizine (ZYRTEC) 10 MG tablet Take 10 mg by mouth daily.  . dupilumab (DUPIXENT) 200 MG/1.14ML prefilled syringe Inject 200 mg into the skin every 14 (fourteen) days. [place]  . famotidine (PEPCID) 40 MG tablet Take 40 mg by mouth daily.  Marland Kitchen ipratropium-albuterol (DUONEB) 0.5-2.5 (3) MG/3ML SOLN USE 3 ML VIA NEBULIZER EVERY 6 HOURS (Patient taking differently: 3 mLs every 6 (six) hours as needed. USE 3 ML VIA NEBULIZER EVERY 6 HOURS)  . loperamide (IMODIUM) 2 MG capsule Take 2 mg by mouth daily as needed for diarrhea or loose stools.   Marland Kitchen MAGNESIUM PO Take by mouth.  . mesalamine (LIALDA) 1.2 g EC tablet TAKE 4 TABLETS BY MOUTH DAILY WITH BREAKFAST  . Multiple Vitamins-Minerals (ZINC PO) Take by mouth.  Marland Kitchen OVER THE COUNTER MEDICATION Take 1 tablet by mouth daily. ZINC  . Tiotropium Bromide Monohydrate (SPIRIVA RESPIMAT) 2.5 MCG/ACT  AERS Inhale 2 puffs into the lungs daily.  Marland Kitchen VITAMIN D PO Take by mouth.  Marland Kitchen albuterol (VENTOLIN HFA) 108 (90 Base) MCG/ACT inhaler Inhale 2 puffs into the lungs every 6 (six) hours as needed for wheezing or shortness of breath.  . mometasone-formoterol (DULERA) 200-5 MCG/ACT AERO Inhale 2 puffs into the lungs in the morning and at bedtime.   Facility-Administered Encounter Medications as of 05/02/2019  Medication  . 0.9 %  sodium chloride infusion     Review of Systems  Review of Systems  Constitutional: Negative for activity change, chills, fatigue, fever and unexpected weight change.  HENT: Positive for congestion, postnasal drip and rhinorrhea. Negative for sinus pressure, sinus pain and sore throat.   Eyes: Negative.   Respiratory: Negative for cough, shortness of breath and wheezing.   Cardiovascular: Negative for chest pain and palpitations.  Gastrointestinal: Negative for constipation, diarrhea, nausea and vomiting.  Endocrine: Negative.   Genitourinary: Negative.   Musculoskeletal: Negative.   Skin: Negative.   Neurological: Negative for dizziness and headaches.  Psychiatric/Behavioral: Negative.  Negative for dysphoric mood. The patient is not nervous/anxious.   All other systems reviewed and are negative.    Physical Exam  BP (!) 122/58 (BP Location: Left Arm, Cuff Size: Normal)   Pulse 67   Temp 97.9 F (36.6 C) (Temporal)   Ht 5' 11"  (1.803 m)   Wt 151 lb 9.6 oz (68.8 kg)   SpO2 97%   BMI 21.14 kg/m   Wt Readings from Last 5 Encounters:  05/02/19 151 lb 9.6 oz (68.8 kg)  03/03/19 148 lb 12.8 oz (67.5 kg)  01/28/19 153 lb (69.4 kg)  12/29/18 152 lb 12.8 oz (69.3 kg)  12/23/18 152 lb (68.9 kg)    BMI Readings from Last 5 Encounters:  05/02/19 21.14 kg/m  03/03/19 20.75 kg/m  01/28/19 21.34 kg/m  12/29/18 21.31 kg/m  12/23/18 21.20 kg/m     Physical Exam Vitals and nursing note reviewed.  Constitutional:      General: He is not in acute  distress.    Appearance: Normal appearance. He is normal weight.  HENT:     Head: Normocephalic and  atraumatic.     Right Ear: Hearing and external ear normal.     Left Ear: Hearing and external ear normal.     Nose: Congestion and rhinorrhea present. No mucosal edema.     Right Turbinates: Not enlarged.     Left Turbinates: Not enlarged.     Mouth/Throat:     Mouth: Mucous membranes are dry.     Pharynx: Oropharynx is clear. No oropharyngeal exudate.  Eyes:     Pupils: Pupils are equal, round, and reactive to light.  Cardiovascular:     Rate and Rhythm: Normal rate and regular rhythm.     Pulses: Normal pulses.     Heart sounds: Normal heart sounds. No murmur.  Pulmonary:     Effort: Pulmonary effort is normal.     Breath sounds: Normal breath sounds. No decreased breath sounds, wheezing or rales.  Musculoskeletal:     Cervical back: Normal range of motion.     Right lower leg: No edema.     Left lower leg: No edema.  Lymphadenopathy:     Cervical: No cervical adenopathy.  Skin:    General: Skin is warm and dry.     Capillary Refill: Capillary refill takes less than 2 seconds.     Findings: No erythema or rash.  Neurological:     General: No focal deficit present.     Mental Status: He is alert and oriented to person, place, and time.     Motor: No weakness.     Coordination: Coordination normal.     Gait: Gait is intact. Gait normal.  Psychiatric:        Mood and Affect: Mood normal.        Behavior: Behavior normal. Behavior is cooperative.        Thought Content: Thought content normal.        Judgment: Judgment normal.       Assessment & Plan:   Discussion:  We will refer to primary care at patient's request - [facility].  Centrilobular Emphysema Plan: Continue Dulera 200 Continue Spiriva Respimat 2.5 Continue duo nebs as needed Continue Dupixent  Increase daily physical activity   Medication management Plan: Paperwork provided for patient today  to reapply for [employer] patient assistance   History of COVID-19 Plan: Continue to practice hand hygiene, wear mask, practice physical distancing Continue to monitor symptoms Obtain covid vaccine         Return in about 3 months (around 08/02/2019), or if symptoms worsen or fail to improve, for Follow up with Dr. Vaughan Browner.   Lauraine Rinne, NP 05/02/2019   This appointment required 32 minutes of patient care (this includes precharting, chart review, review of results, face-to-face care, etc.).

## 2019-05-02 ENCOUNTER — Ambulatory Visit: Payer: PPO | Admitting: Pulmonary Disease

## 2019-05-02 ENCOUNTER — Other Ambulatory Visit: Payer: Self-pay

## 2019-05-02 ENCOUNTER — Encounter: Payer: Self-pay | Admitting: Pulmonary Disease

## 2019-05-02 VITALS — BP 122/58 | HR 67 | Temp 97.9°F | Ht 71.0 in | Wt 151.6 lb

## 2019-05-02 DIAGNOSIS — J449 Chronic obstructive pulmonary disease, unspecified: Secondary | ICD-10-CM

## 2019-05-02 DIAGNOSIS — Z79899 Other long term (current) drug therapy: Secondary | ICD-10-CM

## 2019-05-02 DIAGNOSIS — Z8616 Personal history of COVID-19: Secondary | ICD-10-CM

## 2019-05-02 MED ORDER — MOMETASONE FURO-FORMOTEROL FUM 200-5 MCG/ACT IN AERO: 2.0000 | INHALATION_SPRAY | Freq: Two times a day (BID) | RESPIRATORY_TRACT | 11 refills | Status: DC

## 2019-05-02 MED ORDER — ALBUTEROL SULFATE HFA 108 (90 BASE) MCG/ACT IN AERS: 2.0000 | INHALATION_SPRAY | Freq: Four times a day (QID) | RESPIRATORY_TRACT | 11 refills | Status: DC | PRN

## 2019-05-02 NOTE — Patient Instructions (Addendum)
You were seen today by Lauraine Rinne, NP  for:   [facility] seeing you today.  Continue your medications as outlined.  Please start using nasal saline rinses.  Sample provided today.  I do believe you need to proceed forward with receiving the Covid vaccine.  Try to get this scheduled.  See back in office in 3 months.  Aaron Edelman  1. Centrilobular Emphysema  Dulera 200 >>> 2 puffs in the morning right when you wake up, rinse out your mouth after use, 12 hours later 2 puffs, rinse after use >>> Take this daily, no matter what >>> This is not a rescue inhaler   Spiriva Respimat 2.5 >>> 2 puffs daily >>> Do this every day >>>This is not a rescue inhaler  Only use your albuterol as a rescue medication to be used if you can't catch your breath by resting or doing a relaxed purse lip breathing pattern.  - The less you use it, the better it will work when you need it. - Ok to use up to 2 puffs  every 4 hours if you must but call for immediate appointment if use goes up over your usual need - Don't leave home without it !!  (think of it like the spare tire for your car)   Continue Dupixent  Continue daily antihistamine  Please start using nasal saline rinses 1-2 times daily Use distilled water Get water lukewarm like a baby bottle Shake well Tuck your chin when doing this   Use Flonase after nasal saline rinses  2. Medication management  Signed paperwork today for application to [employer] patient assistance  Handed back to you today  3. History of COVID-19  Doing well status post Covid  I believe you should receive your Covid vaccine  Can get scheduled at [facility] or follow-up with [facility]  COVID-19 Vaccine Information can be found at: [email or web address] For questions related to vaccine distribution or appointments, please email [email or web address] or call [phone].      We recommend today:   Meds ordered this  encounter  Medications  . mometasone-formoterol (DULERA) 200-5 MCG/ACT AERO    Sig: Inhale 2 puffs into the lungs in the morning and at bedtime.    Dispense:  8.8 g    Refill:  11  . albuterol (VENTOLIN HFA) 108 (90 Base) MCG/ACT inhaler    Sig: Inhale 2 puffs into the lungs every 6 (six) hours as needed for wheezing or shortness of breath.    Dispense:  8 g    Refill:  11    Please dispense as Proventil only    Follow Up:    Return in about 3 months (around 08/02/2019), or if symptoms worsen or fail to improve, for Follow up with Dr. Vaughan Browner.   Please do your part to reduce the spread of COVID-19:      Reduce your risk of any infection  and COVID19 by using the similar precautions used for avoiding the common cold or flu:  Marland Kitchen Wash your hands often with soap and warm water for at least 20 seconds.  If soap and water are not readily available, use an alcohol-based hand sanitizer with at least 60% alcohol.  . If coughing or sneezing, cover your mouth and nose by coughing or sneezing into the elbow areas of your shirt or coat, into a tissue or into your sleeve (not your hands). Langley Gauss A MASK when in public  . Avoid shaking hands with others and consider head nods or verbal  greetings only. . Avoid touching your eyes, nose, or mouth with unwashed hands.  . Avoid close contact with people who are sick. . Avoid places or events with large numbers of people in one location, like concerts or sporting events. . If you have some symptoms but not all symptoms, continue to monitor at home and seek medical attention if your symptoms worsen. . If you are having a medical emergency, call 911.   [place] / e-Visit: [email or web address]         [facility]: [facility]: [phone]                   [facility]: [phone]     It is flu  season:   >>> Best ways to protect herself from the flu: Receive the yearly flu vaccine, practice good hand hygiene washing with soap and also using hand sanitizer when available, eat a nutritious meals, get adequate rest, hydrate appropriately   Please contact the office if your symptoms worsen or you have concerns that you are not improving.   Thank you for choosing [facility] for your healthcare, and for allowing [place] to partner with you on your healthcare journey. I am thankful to be able to provide care to you today.   [name] FNP-C

## 2019-05-02 NOTE — Assessment & Plan Note (Signed)
Plan: Continue Dulera 200 Continue Spiriva Respimat 2.5 Continue duo nebs as needed Continue Dupixent  Increase daily physical activity

## 2019-05-02 NOTE — Assessment & Plan Note (Addendum)
Plan: Continue to practice hand hygiene, wear mask, practice physical distancing Continue to monitor symptoms Obtain covid vaccine

## 2019-05-02 NOTE — Assessment & Plan Note (Signed)
Plan: Paperwork provided for patient today to reapply for [employer] patient assistance

## 2019-05-06 ENCOUNTER — Ambulatory Visit: Payer: PPO | Attending: Internal Medicine

## 2019-05-06 ENCOUNTER — Other Ambulatory Visit: Payer: Self-pay

## 2019-05-06 DIAGNOSIS — Z23 Encounter for immunization: Secondary | ICD-10-CM

## 2019-05-06 NOTE — Progress Notes (Signed)
   Covid-19 Vaccination Clinic  Name:  [name]    MRN: [id number] DOB: October 06, 1945  05/06/2019  [name] was observed post Covid-19 immunization for 15 minutes without incident. He was provided with Vaccine Information Sheet and instruction to access the V-Safe system.   [name] was instructed to call 911 with any severe reactions post vaccine: Marland Kitchen Difficulty breathing  . Swelling of face and throat  . A fast heartbeat  . A bad rash all over body  . Dizziness and weakness   Immunizations Administered    Name Date Dose VIS Date Route   Pfizer COVID-19 Vaccine 05/06/2019 12:26 PM 0.3 mL 01/21/2019 Intramuscular   Manufacturer: [place]   Lot: [id number]   [place]: [address]-1

## 2019-05-19 ENCOUNTER — Telehealth: Payer: Self-pay | Admitting: Pulmonary Disease

## 2019-05-19 NOTE — Telephone Encounter (Signed)
Routed to injection pool to follow up.

## 2019-05-19 NOTE — Telephone Encounter (Signed)
Spoke with pharmacist, the Rx was sent in for 251m of Dupizent but stated the patient was on 3078min the past and they wanted to make sure this was an intentional decrease. It may have been an error on their end she explained but wanted to make sure with [place] he is on the right dose. From reviewing the chart it looks like he was on 30071mn Dupixent in Oct 2020. Please clarify dose.  [phone] ext [phone]

## 2019-05-19 NOTE — Telephone Encounter (Signed)
I believe the maintenance dose should be 300 mg every other week. Please change order.

## 2019-05-20 ENCOUNTER — Telehealth: Payer: Self-pay | Admitting: Pulmonary Disease

## 2019-05-20 MED ORDER — DUPIXENT 300 MG/2ML ~~LOC~~ SOSY: 300.0000 mg | PREFILLED_SYRINGE | SUBCUTANEOUS | 11 refills | Status: DC

## 2019-05-20 MED ORDER — DUPILUMAB 200 MG/1.14ML ~~LOC~~ SOSY: 300.0000 mg | PREFILLED_SYRINGE | SUBCUTANEOUS | 11 refills | Status: DC

## 2019-05-20 NOTE — Telephone Encounter (Signed)
Pt calling for update on injection. Can be reached at [phone]

## 2019-05-20 NOTE — Telephone Encounter (Signed)
Called and spoke with Patient.  [facility] aware he can call [facility] to set up home delivery.  Nothing further at this time.

## 2019-05-20 NOTE — Telephone Encounter (Signed)
[facility]  Called and spoke with Antonio [name],Pharmacist to clarify Dupixent dosage.  Called and spoke with Patient. [facility] aware he can call for [facility].  Nothing further at this time.

## 2019-05-20 NOTE — Telephone Encounter (Signed)
Called and spoke with Pharmacist, Forde Radon.  Dupixent 348m prescription clarified, verbal prescription given.  Dupixent is to be processed and be shipped to Patient.  Nothing further at this time.

## 2019-05-25 ENCOUNTER — Telehealth: Payer: Self-pay | Admitting: Pulmonary Disease

## 2019-05-25 NOTE — Telephone Encounter (Signed)
Spoke with [place] at [facility]. They needed clarification on the pt's Dupixent prescription. This has been given. Nothing further is needed.

## 2019-05-31 ENCOUNTER — Ambulatory Visit: Payer: PPO | Attending: Internal Medicine

## 2019-05-31 DIAGNOSIS — Z23 Encounter for immunization: Secondary | ICD-10-CM

## 2019-05-31 NOTE — Progress Notes (Signed)
   Covid-19 Vaccination Clinic  Name:  Antonio [name]    MRN: [id number] DOB: 03-12-1945  05/31/2019  Antonio [name] was observed post Covid-19 immunization for 15 minutes without incident. He was provided with Vaccine Information Sheet and instruction to access the V-Safe system.   Antonio [name] was instructed to call 911 with any severe reactions post vaccine: Marland Kitchen Difficulty breathing  . Swelling of face and throat  . A fast heartbeat  . A bad rash all over body  . Dizziness and weakness   Immunizations Administered    Name Date Dose VIS Date Route   Pfizer COVID-19 Vaccine 05/31/2019 10:03 AM 0.3 mL 04/06/2018 Intramuscular   Manufacturer: [employer]   Lot: [id number]   [place]: [address]-4

## 2019-07-04 ENCOUNTER — Encounter: Payer: Self-pay | Admitting: *Deleted

## 2019-07-05 DIAGNOSIS — L218 Other seborrheic dermatitis: Secondary | ICD-10-CM | POA: Diagnosis not present

## 2019-07-05 DIAGNOSIS — D2262 Melanocytic nevi of left upper limb, including shoulder: Secondary | ICD-10-CM | POA: Diagnosis not present

## 2019-07-05 DIAGNOSIS — D2261 Melanocytic nevi of right upper limb, including shoulder: Secondary | ICD-10-CM | POA: Diagnosis not present

## 2019-07-05 DIAGNOSIS — L570 Actinic keratosis: Secondary | ICD-10-CM | POA: Diagnosis not present

## 2019-07-05 DIAGNOSIS — D225 Melanocytic nevi of trunk: Secondary | ICD-10-CM | POA: Diagnosis not present

## 2019-07-05 DIAGNOSIS — D171 Benign lipomatous neoplasm of skin and subcutaneous tissue of trunk: Secondary | ICD-10-CM | POA: Diagnosis not present

## 2019-07-14 ENCOUNTER — Telehealth: Payer: Self-pay | Admitting: General Practice

## 2019-07-14 NOTE — Telephone Encounter (Signed)
Received referral that patient needed new pcp.  Left message for patient to call back to schedule appointment

## 2019-07-19 ENCOUNTER — Ambulatory Visit: Payer: PPO | Admitting: Internal Medicine

## 2019-07-19 ENCOUNTER — Encounter: Payer: Self-pay | Admitting: Internal Medicine

## 2019-07-19 ENCOUNTER — Telehealth: Payer: Self-pay | Admitting: Internal Medicine

## 2019-07-19 VITALS — BP 110/70 | HR 60 | Ht 69.0 in | Wt 152.1 lb

## 2019-07-19 DIAGNOSIS — K5190 Ulcerative colitis, unspecified, without complications: Secondary | ICD-10-CM

## 2019-07-19 DIAGNOSIS — R151 Fecal smearing: Secondary | ICD-10-CM

## 2019-07-19 DIAGNOSIS — K648 Other hemorrhoids: Secondary | ICD-10-CM

## 2019-07-19 DIAGNOSIS — R1013 Epigastric pain: Secondary | ICD-10-CM

## 2019-07-19 DIAGNOSIS — Z86010 Personal history of colonic polyps: Secondary | ICD-10-CM | POA: Diagnosis not present

## 2019-07-19 MED ORDER — FAMOTIDINE 40 MG PO TABS: 40.0000 mg | ORAL_TABLET | Freq: Every day | ORAL | 1 refills | Status: DC

## 2019-07-19 MED ORDER — HYDROCORTISONE ACETATE 25 MG RE SUPP: 25.0000 mg | Freq: Every day | RECTAL | 0 refills | Status: DC

## 2019-07-19 MED ORDER — MESALAMINE 1.2 G PO TBEC: DELAYED_RELEASE_TABLET | ORAL | 1 refills | Status: DC

## 2019-07-19 NOTE — Telephone Encounter (Signed)
We gave patient written scripts for all of his medications as he requested. I [facility] for hydrocortisone suppositories as he is requesting so he can use goodrx. He verbalizes understanding.

## 2019-07-19 NOTE — Telephone Encounter (Signed)
Called states [facility] does not have patient's scripts and is requesting we send it to [facility]

## 2019-07-19 NOTE — Progress Notes (Signed)
Subjective:    Patient ID: Antonio [name], male    DOB: January 03, 1946, 74 y.o.   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  He was last seen at the time of his last screening/surveillance colonoscopy performed on 08/23/2018.  Colonoscopy on 08/23/2018 revealed adenomatous and sessile serrated colon polyps, all less than a centimeter in size.  Mild rectal erythema which by biopsy did not show chronic active inflammation.  He had internal hemorrhoids.  He reports that he has been feeling well from a colitis standpoint.  He is having red blood intermittently with bowel movements though predominantly just with wiping.  He has noticed some fecal seepage and smearing which is annoying for him.  He is not having diarrhea.  His stools are formed.  There is no abdominal pain, tenesmus.  He is using Lialda 2.4 g daily.  He is no longer using the Canasa suppositories.  He has changed Prilosec to famotidine 40 mg which he takes in the morning.  He is not having heartburn or indigestion symptoms.  He has no dysphagia or odynophagia.  He had COVID-19 in December 2020 which he described as a bad flu.  He did get outpatient remdesivir therapy.  He has since been vaccinated.  He changed omeprazole to famotidine 40 mg daily on advice from his daughter as there was possible evidence this could help with COVID-19.   Review of Systems As per HPI, otherwise negative  Current Medications, Allergies, Past Medical History, Past Surgical History, Family History and Social History were reviewed in [employer] record.     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   1.  Ulcerative proctitis/left-sided colitis (diagnosis July 2015) --his colitis was in remission last summer at colonoscopy.  Rectal erythema did not show chronic or active inflammation.  He has stopped Canasa and is having some what is most likely hemorrhoidal symptoms rather than active proctitis.  See #2.  He will continue Lialda.  Canasa can be used nightly if proctitis symptoms recur --Continue Lialda 2.4 g daily (prescription for 4.8 g daily as this is more affordable_ --Canasa 1 g nightly to be used if proctitis symptoms recur  2.  Bleeding internal hemorrhoids with fecal smearing --we discussed the symptom today.  He has had prior hemorrhoidal treatments throughout the years but they had recurred as evidenced by his colonoscopy last summer.  He is having painless rectal bleeding and fecal smearing with bowel movement which is likely hemorrhoidal rather than proctitis.  We discussed therapy and also possible banding.  Banding brochure given. --Hydrocortisone suppository 25 mg nightly x5 days (20 suppositories given which he will buy through [facility]) --I asked that he call me in a few weeks to see if the hydrocortisone suppositories  improved his symptoms.  If not consider restarting Canasa for 2 to 4 weeks.  If symptoms persist despite medical therapy then hemorrhoidal banding would be recommended  3.  History of colon polyps --surveillance colonoscopy recommended in July 2023  4.  GERD/dyspepsia --he changed to famotidine from omeprazole around the time he had COVID-19.  This seems to be controlling his symptoms.  He will continue  famotidine 40 mg daily. --Famotidine 40 mg daily  40 minutes total spent today including patient facing time, coordination of care, reviewing medical history/procedures/pertinent radiology studies, and documentation of the encounter.

## 2019-07-19 NOTE — Patient Instructions (Signed)
We have given you written scripts for the following medications:  Hydrocortisone suppositories Lialda Famotidine  Please call or mychart [place] with an update on how your hemorrhoids are doing after you have used the hydrocortisone suppositories.  Follow up with Dr Hilarie Fredrickson in 1 year.  If you are age 76 or older, your body mass index should be between 23-30. Your Body mass index is 22.46 kg/m. If this is out of the aforementioned range listed, please consider follow up with your Primary Care Provider.  If you are age 55 or younger, your body mass index should be between 19-25. Your Body mass index is 22.46 kg/m. If this is out of the aformentioned range listed, please consider follow up with your Primary Care Provider.   Due to recent changes in healthcare laws, you may see the results of your imaging and laboratory studies on MyChart before your provider has had a chance to review them.  We understand that in some cases there may be results that are confusing or concerning to you. Not all laboratory results come back in the same time frame and the provider may be waiting for multiple results in order to interpret others.  Please give [place] 48 hours in order for your provider to thoroughly review all the results before contacting the office for clarification of your results.

## 2019-08-11 DIAGNOSIS — I480 Paroxysmal atrial fibrillation: Secondary | ICD-10-CM

## 2019-08-11 HISTORY — DX: Paroxysmal atrial fibrillation: I48.0

## 2019-08-15 NOTE — Progress Notes (Signed)
@Patient  ID: Antonio [name], male    DOB: 23-Apr-1945, 74 y.o.   MRN: [id number]  Chief Complaint  Patient presents with  . Follow-up    ACOS    Referring provider: [name], MD  HPI:  74 year old male former smoker followed in our office for severe COPD with emphysema  PMH: CAD, arthrosclerosis Smoker/ Smoking History: Former smoker quit 2013.  26.25 pack years Maintenance: Dulera 200, Spiriva 2.5,  Dupixent  Pt of: Dr. Vaughan Browner   08/16/2019  - Visit   74 year old male former smoker followed in our office for severe COPD with emphysema.  Patient felt to have aspect of asthma COPD overlap syndrome.  He is currently maintained on Dulera 200, Spiriva Respimat 2.5 and Dupixent.  He feels his breathing is at baseline but has not improved.  He is concerned regarding this.  His wife is also here today to discuss her concerns.  Patient admits that he has only been using 1 puff of his Dulera in the evening.  We will discuss this today.  He reports adherence elsewise.  He is not using nasal saline rinses as previously requested.  He does have significant postnasal drip and clear nasal drainage.  Patient previously went to pulmonary rehab in 2016.  He did not feel like it was clinically beneficial for him.  He is not very active right now.  They are concerned that he may have a sedentary lifestyle.  Patient was walked in office today completed 3 laps did not have oxygen desaturations.  Questionaires / Pulmonary Flowsheets:   ACT:  Asthma Control Test ACT Total Score  08/16/2019 14    MMRC: mMRC Dyspnea Scale mMRC Score  05/02/2019 2    Epworth:  No flowsheet data found.  Tests:   06/09/2014 pulmonary function test> ratio 51%, FEV1 1.47 L (40% predicted), total lung capacity 5.43 L (71% predicted). DLCO 16.26 (44% predicted)  December 2017 lung function testing showed ratio 50%, FEV1 1.47 L 43% predicted, FVC 2.96 L 67% predicted  March 2020 pulmonary function testing  ratio 38%, FEV1 0.97 L 31% predicted, total lung capacity 8.44 L 119% predicted, residual volume 198% predicted, DLCO 14.0 55% predicted  March 2020 high-resolution CT scan of the chest images : Centrilobular emphysema, no interstitial lung disease, aortic atherosclerosis  04/20/2018-eosinophils relative 7.3, eosinophils absolute 0.6 04/20/2018-IgE-67  01/20/2019-SARS-CoV-2-detected  FENO:  No results found for: NITRICOXIDE  PFT: PFT Results Latest Ref Rng & Units 04/20/2018 06/07/2014  FVC-Pre L 2.56 2.80  FVC-Predicted Pre % 60 57  FVC-Post L 2.60 2.90  FVC-Predicted Post % 61 59  Pre FEV1/FVC % % 38 52  Post FEV1/FCV % % 39 51  FEV1-Pre L 0.97 1.46  FEV1-Predicted Pre % 31 40  FEV1-Post L 1.02 1.47  DLCO UNC% % 55 44  DLCO COR %Predicted % 68 55  TLC L 8.44 -  TLC % Predicted % 119 -  RV % Predicted % 198 -    WALK:  SIX MIN WALK 08/16/2019 03/11/2018 08/03/2017 04/25/2014  Supplimental Oxygen during Test? (L/min) No No - No  2 Minute Oxygen Saturation % - - 90 -  2 Minute HR - - 67 -  4 Minute Oxygen Saturation % - - 87 -  4 Minute HR - - 69 -  6 Minute Oxygen Saturation % - - 85 -  6 Minute HR - - 84 -  Tech Comments: moderate pace with desats patient walked at fast pace; winded not short of  breath, patient tolerated well  - pt walked a moderately fast pace, tolerated walk well.     Imaging: No results found.  Lab Results:  CBC    Component Value Date/Time   WBC 8.6 04/20/2018 1551   RBC 4.71 04/20/2018 1551   HGB 14.9 04/20/2018 1551   HGB 15.2 11/26/2015 0933   HCT 44.0 04/20/2018 1551   HCT 43.9 11/26/2015 0933   PLT 218.0 04/20/2018 1551   PLT 247 11/26/2015 0933   MCV 93.4 04/20/2018 1551   MCV 90 11/26/2015 0933   MCV 95 10/01/2013 2131   MCH 31.0 04/28/2016 1433   MCHC 33.9 04/20/2018 1551   RDW 12.6 04/20/2018 1551   RDW 13.6 11/26/2015 0933   RDW 12.4 10/01/2013 2131   LYMPHSABS 2.0 04/20/2018 1551   MONOABS 1.3 (H) 04/20/2018 1551   EOSABS 0.6  04/20/2018 1551   BASOSABS 0.1 04/20/2018 1551    BMET    Component Value Date/Time   NA 139 12/13/2018 0945   NA 138 10/01/2013 2131   K 4.5 12/13/2018 0945   K 4.2 10/01/2013 2131   CL 101 12/13/2018 0945   CL 107 10/01/2013 2131   CO2 25 12/13/2018 0945   CO2 23 10/01/2013 2131   GLUCOSE 106 (H) 12/13/2018 0945   GLUCOSE 72 05/28/2016 1450   GLUCOSE 118 (H) 10/01/2013 2131   BUN 9 12/13/2018 0945   BUN 11 10/01/2013 2131   CREATININE 1.08 12/13/2018 0945   CREATININE 0.93 10/01/2013 2131   CALCIUM 9.8 12/13/2018 0945   CALCIUM 8.7 10/01/2013 2131   GFRNONAA 67 12/13/2018 0945   GFRNONAA >60 10/01/2013 2131   GFRAA 77 12/13/2018 0945   GFRAA >60 10/01/2013 2131    BNP No results found for: BNP  ProBNP No results found for: PROBNP  Specialty Problems      Pulmonary Problems   Centrilobular Emphysema    Noted on 04/2014 CXR 06/09/2014 pulmonary function test> ratio 51%, FEV1 1.47 L (40% predicted), total lung capacity 5.43 L (71% predicted). 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 Penicillins Rash    Has patient had a PCN reaction causing immediate rash, facial/tongue/throat swelling, SOB or lightheadedness with hypotension:Yes Has patient had a PCN reaction causing severe rash involving mucus membranes or skin necrosis:Yes Has patient had a PCN reaction that required hospitalization:No Has patient had a PCN reaction occurring within the last 10 years:No If all of the above answers are "NO", then may proceed with Cephalosporin use.     Immunization History  Administered Date(s) Administered  . Influenza Inj Mdck Quad Pf 10/28/2017  . Influenza Split 11/24/2013  . Influenza, High Dose Seasonal PF 11/05/2016, 10/29/2017, 10/06/2018  . Influenza,inj,Quad  PF,6+ Mos 11/08/2014, 11/26/2015  . Influenza-Unspecified 10/11/2013, 10/11/2013  . PFIZER SARS-COV-2 Vaccination 05/06/2019, 05/31/2019  . Pneumococcal Conjugate-13 04/18/2014  . Pneumococcal Polysaccharide-23 11/26/2015  . Tdap 02/13/2011  . Zoster 11/07/2010  . Zoster Recombinat (Shingrix) 12/01/2016, 03/02/2017    Past Medical History:  Diagnosis Date  . Allergy   . Calf pain   . COPD (chronic obstructive pulmonary disease) (HCC)    no Oxygen per pt  . COVID-19   . Dyspnea   . Hypokalemia   . Internal hemorrhoids   . Internal hemorrhoids   .  Muscle cramp   . Muscle pain   . Pneumonia   . PONV (postoperative nausea and vomiting)   . Tubular adenoma of colon   . Ulcerative colitis ([place])     Tobacco History: Social History   Tobacco Use  Smoking Status Former Smoker  . Packs/day: 0.75  . Years: 35.00  . Pack years: 26.25  . Types: Cigarettes, Cigars  . Quit date: 06/11/2011  . Years since quitting: 8.1  Smokeless Tobacco Never Used  Tobacco Comment   smoking cessation materials not required   Counseling given: Yes Comment: smoking cessation materials not required  Continue to not smoke  Outpatient Encounter Medications as of 08/16/2019  Medication Sig  . albuterol (PROVENTIL) (2.5 MG/3ML) 0.083% nebulizer solution Take 3 mLs (2.5 mg total) by nebulization every 6 (six) hours as needed for wheezing or shortness of breath.  Marland Kitchen albuterol (VENTOLIN HFA) 108 (90 Base) MCG/ACT inhaler Inhale 2 puffs into the lungs every 6 (six) hours as needed for wheezing or shortness of breath.  . Ascorbic Acid (VITAMIN C PO) Take by mouth.  . Budeson-Glycopyrrol-Formoterol (BREZTRI AEROSPHERE) 160-9-4.8 MCG/ACT AERO Inhale 2 puffs into the lungs 2 (two) times daily.  . cetirizine (ZYRTEC) 10 MG tablet Take 10 mg by mouth daily.  . dupilumab (DUPIXENT) 300 MG/2ML prefilled syringe Inject 300 mg into the skin every 14 (fourteen) days.  . famotidine (PEPCID) 40 MG tablet Take 1 tablet  (40 mg total) by mouth daily.  . hydrocortisone (ANUSOL-HC) 25 MG suppository Place 1 suppository (25 mg total) rectally at bedtime.  Marland Kitchen ipratropium-albuterol (DUONEB) 0.5-2.5 (3) MG/3ML SOLN USE 3 ML VIA NEBULIZER EVERY 6 HOURS (Patient taking differently: 3 mLs every 6 (six) hours as needed. USE 3 ML VIA NEBULIZER EVERY 6 HOURS)  . loperamide (IMODIUM) 2 MG capsule Take 2 mg by mouth daily as needed for diarrhea or loose stools.   Marland Kitchen MAGNESIUM PO Take by mouth.  . mesalamine (LIALDA) 1.2 g EC tablet TAKE 4 TABLETS BY MOUTH DAILY WITH BREAKFAST  . mometasone-formoterol (DULERA) 200-5 MCG/ACT AERO Inhale 2 puffs into the lungs in the morning and at bedtime.  . Multiple Vitamins-Minerals (ZINC PO) Take by mouth.  Marland Kitchen OVER THE COUNTER MEDICATION Take 1 tablet by mouth daily. ZINC  . Tiotropium Bromide Monohydrate (SPIRIVA RESPIMAT) 2.5 MCG/ACT AERS Inhale 2 puffs into the lungs daily.  Marland Kitchen VITAMIN D PO Take by mouth.   No facility-administered encounter medications on file as of 08/16/2019.     Review of Systems  Review of Systems  Constitutional: Positive for fatigue. Negative for activity change, chills, fever and unexpected weight change.  HENT: Positive for congestion. Negative for postnasal drip, rhinorrhea, sinus pressure, sinus pain and sore throat.   Eyes: Negative.   Respiratory: Positive for shortness of breath. Negative for cough and wheezing.   Cardiovascular: Negative for chest pain and palpitations.  Gastrointestinal: Negative for constipation, diarrhea, nausea and vomiting.  Endocrine: Negative.   Genitourinary: Negative.   Musculoskeletal: Negative.   Skin: Negative.   Allergic/Immunologic: Positive for environmental allergies.  Neurological: Negative for dizziness and headaches.  Psychiatric/Behavioral: Negative.  Negative for dysphoric mood. The patient is not nervous/anxious.   All other systems reviewed and are negative.    Physical Exam  BP 124/76 (BP Location: Left  Arm, Cuff Size: Normal)   Pulse 72   Temp 97.9 F (36.6 C) (Oral)   Ht 5' 11"  (1.803 m)   Wt 153 lb 9.6 oz (69.7 kg)   SpO2  94%   BMI 21.42 kg/m   Wt Readings from Last 5 Encounters:  08/16/19 153 lb 9.6 oz (69.7 kg)  07/19/19 152 lb 2 oz (69 kg)  05/02/19 151 lb 9.6 oz (68.8 kg)  03/03/19 148 lb 12.8 oz (67.5 kg)  01/28/19 153 lb (69.4 kg)    BMI Readings from Last 5 Encounters:  08/16/19 21.42 kg/m  07/19/19 22.46 kg/m  05/02/19 21.14 kg/m  03/03/19 20.75 kg/m  01/28/19 21.34 kg/m     Physical Exam Vitals and nursing note reviewed.  Constitutional:      General: He is not in acute distress.    Appearance: Normal appearance. He is obese.  HENT:     Head: Normocephalic and atraumatic.     Right Ear: Hearing, tympanic membrane, ear canal and external ear normal.     Left Ear: Hearing, tympanic membrane, ear canal and external ear normal.     Nose: Congestion and rhinorrhea present. No mucosal edema.     Right Turbinates: Not enlarged.     Left Turbinates: Not enlarged.  Eyes:     Pupils: Pupils are equal, round, and reactive to light.  Cardiovascular:     Rate and Rhythm: Normal rate and regular rhythm.     Pulses: Normal pulses.     Heart sounds: Normal heart sounds. No murmur heard.   Pulmonary:     Effort: Pulmonary effort is normal.     Breath sounds: Normal breath sounds. No decreased breath sounds, wheezing or rales.  Musculoskeletal:     Cervical back: Normal range of motion.     Right lower leg: No edema.     Left lower leg: No edema.  Lymphadenopathy:     Cervical: No cervical adenopathy.  Skin:    General: Skin is warm and dry.     Capillary Refill: Capillary refill takes less than 2 seconds.     Findings: No erythema or rash.  Neurological:     General: No focal deficit present.     Mental Status: He is alert and oriented to person, place, and time.     Motor: No weakness.     Coordination: Coordination normal.     Gait: Gait is intact.  Gait normal.  Psychiatric:        Mood and Affect: Mood normal.        Behavior: Behavior normal. Behavior is cooperative.        Thought Content: Thought content normal.        Judgment: Judgment normal.       Assessment & Plan:   Centrilobular Emphysema Reviewed pulmonary function testing from 2016 and 2020 Reviewed spirometry from 2017  Plan: Trial of Breztri  Hold Dulera 200 and Spiriva Respimat 2.5 Continue Dupixent Encourage patient to increase overall physical activity goal should be 30 minutes of physical activity a day 32-monthfollow-up  Severe persistent asthma without complication Plan: Trial of Breztri  Hold Dulera 200 and Spiriva Respimat 2.5 Continue Dupixent Encourage patient to increase overall physical activity goal should be 30 minutes of physical activity a day 371-monthollow-up  History of COVID-19 Patient received Covid vaccine  Medication management Plan: Trial of Breztri  Hold Dulera 200 and Spiriva Respimat 2.5 Continue Dupixent     Return in about 3 months (around 11/16/2019), or if symptoms worsen or fail to improve, for Follow up with BrWyn QuakerNP-C.   BrLauraine RinneNP 08/16/2019   This appointment required 45 minutes of patient care (this  includes precharting, chart review, review of results, face-to-face care, etc.).

## 2019-08-16 ENCOUNTER — Telehealth: Payer: Self-pay | Admitting: Pulmonary Disease

## 2019-08-16 ENCOUNTER — Encounter: Payer: Self-pay | Admitting: Pulmonary Disease

## 2019-08-16 ENCOUNTER — Other Ambulatory Visit: Payer: Self-pay

## 2019-08-16 ENCOUNTER — Ambulatory Visit: Payer: PPO | Admitting: Pulmonary Disease

## 2019-08-16 VITALS — BP 124/76 | HR 72 | Temp 97.9°F | Ht 71.0 in | Wt 153.6 lb

## 2019-08-16 DIAGNOSIS — J449 Chronic obstructive pulmonary disease, unspecified: Secondary | ICD-10-CM

## 2019-08-16 DIAGNOSIS — Z79899 Other long term (current) drug therapy: Secondary | ICD-10-CM

## 2019-08-16 DIAGNOSIS — J4550 Severe persistent asthma, uncomplicated: Secondary | ICD-10-CM | POA: Diagnosis not present

## 2019-08-16 DIAGNOSIS — Z8616 Personal history of COVID-19: Secondary | ICD-10-CM | POA: Diagnosis not present

## 2019-08-16 MED ORDER — BREZTRI AEROSPHERE 160-9-4.8 MCG/ACT IN AERO: 2.0000 | INHALATION_SPRAY | Freq: Two times a day (BID) | RESPIRATORY_TRACT | 0 refills | Status: DC

## 2019-08-16 NOTE — Assessment & Plan Note (Signed)
Plan: Trial of Breztri  Hold Dulera 200 and Spiriva Respimat 2.5 Continue Dupixent

## 2019-08-16 NOTE — Telephone Encounter (Signed)
08/16/2019  Pharmacy team can we investigate what the estimated cost would be for the patient for either Breztri or Trelegy Ellipta 200?  Patient currently maintained on Dulera 200 and Spiriva Respimat 2.5.  He is receiving those through patient assistance with the assistance of [facility].  Patient is interested in starting a triple therapy inhaler.  He was sent home today with samples of Breztri.   [name], FNP

## 2019-08-16 NOTE — Assessment & Plan Note (Signed)
Patient received Covid vaccine

## 2019-08-16 NOTE — Assessment & Plan Note (Signed)
Plan: Trial of Breztri  Hold Dulera 200 and Spiriva Respimat 2.5 Continue Dupixent Encourage patient to increase overall physical activity goal should be 30 minutes of physical activity a day 50-monthfollow-up

## 2019-08-16 NOTE — Telephone Encounter (Signed)
Ralph Leyden is out of the office until 7/12 and I am unable to run test claims.  She will do benefits investigation upon her return.  If it is unaffordable will reach out to [facility] to help coordinate patient assistance as they may already have necessary income documents on file.   Mariella Saa, PharmD, [place], CPP Clinical Specialty Pharmacist (Rheumatology and Pulmonology)  08/16/2019 4:08 PM

## 2019-08-16 NOTE — Telephone Encounter (Signed)
That is fine for the timeframe for the test claims.  Just wanted to do a spot check.  She can do when she returns from vacation.Thank Drenda Freeze, FNP

## 2019-08-16 NOTE — Assessment & Plan Note (Signed)
Reviewed pulmonary function testing from 2016 and 2020 Reviewed spirometry from 2017  Plan: Trial of Breztri  Hold Dulera 200 and Spiriva Respimat 2.5 Continue Dupixent Encourage patient to increase overall physical activity goal should be 30 minutes of physical activity a day 81-monthfollow-up

## 2019-08-16 NOTE — Patient Instructions (Addendum)
You were seen today by Lauraine Rinne, NP  for:   1. Centrilobular Emphysema 2. Severe persistent asthma without complication 3. History of COVID-19 4. Medication management  Walk today in office  Trial of Breztri >>> 2 puffs in the morning right when you wake up, rinse out your mouth after use, 12 hours later 2 puffs, rinse after use >>> Take this daily, no matter what >>> This is not a rescue inhaler   Hold Spiriva Respimat 2.5 and Dulera 200  Contact our office in 2 weeks and let us know how you are doing on the [employer] Dupixent  Start nasal saline rinses twice daily Use distilled water Shake well Get bottle lukewarm like a baby bottle  Start increasing your overall physical activity.  At a baseline start using 10 minutes of your morning for walking.  Repeat this also in the evening.  Goal should be to increase by 1 minute each week.  Final goal should be a baseline of at least 30 minutes of physical activity a day.  If this becomes difficult or you feel like you have reconsidered your stance on referring back to pulmonary rehab please let us know and we can refer you back to pulmonary rehab   Follow Up:    Return in about 3 months (around 11/16/2019), or if symptoms worsen or fail to improve, for Follow up with [name] FNP-C.   Please do your part to reduce the spread of COVID-19:      Reduce your risk of any infection  and COVID19 by using the similar precautions used for avoiding the common cold or flu:  Marland Kitchen Wash your hands often with soap and warm water for at least 20 seconds.  If soap and water are not readily available, use an alcohol-based hand sanitizer with at least 60% alcohol.  . If coughing or sneezing, cover your mouth and nose by coughing or sneezing into the elbow areas of your shirt or coat, into a tissue or into your sleeve (not your hands). Langley Gauss A MASK when in public  . Avoid shaking hands with others and consider head nods or verbal  greetings only. . Avoid touching your eyes, nose, or mouth with unwashed hands.  . Avoid close contact with people who are sick. . Avoid places or events with large numbers of people in one location, like concerts or sporting events. . If you have some symptoms but not all symptoms, continue to monitor at home and seek medical attention if your symptoms worsen. . If you are having a medical emergency, call 911.   [place] / e-Visit: [email or web address]         [facility]: [facility]: [phone]                   [facility]: [phone]     It is flu season:   >>> Best ways to protect herself from the flu: Receive the yearly flu vaccine, practice good hand hygiene washing with soap and also using hand sanitizer when available, eat a nutritious meals, get adequate rest, hydrate appropriately   Please contact the office if your symptoms worsen or you have concerns that you are not improving.   Thank you for choosing [facility] for your healthcare, and for allowing [place] to partner with you on your healthcare journey. I am thankful to be able to  provide care to you today.   [name] FNP-C

## 2019-08-19 ENCOUNTER — Other Ambulatory Visit: Payer: Self-pay

## 2019-08-19 ENCOUNTER — Telehealth (INDEPENDENT_AMBULATORY_CARE_PROVIDER_SITE_OTHER): Payer: Self-pay | Admitting: Vascular Surgery

## 2019-08-19 ENCOUNTER — Ambulatory Visit (INDEPENDENT_AMBULATORY_CARE_PROVIDER_SITE_OTHER): Payer: PPO

## 2019-08-19 ENCOUNTER — Ambulatory Visit (INDEPENDENT_AMBULATORY_CARE_PROVIDER_SITE_OTHER): Payer: PPO | Admitting: Vascular Surgery

## 2019-08-19 ENCOUNTER — Encounter (INDEPENDENT_AMBULATORY_CARE_PROVIDER_SITE_OTHER): Payer: Self-pay | Admitting: Vascular Surgery

## 2019-08-19 VITALS — BP 138/74 | HR 59 | Ht 71.0 in | Wt 159.0 lb

## 2019-08-19 DIAGNOSIS — I6523 Occlusion and stenosis of bilateral carotid arteries: Secondary | ICD-10-CM

## 2019-08-19 DIAGNOSIS — J439 Emphysema, unspecified: Secondary | ICD-10-CM | POA: Diagnosis not present

## 2019-08-19 DIAGNOSIS — I70212 Atherosclerosis of native arteries of extremities with intermittent claudication, left leg: Secondary | ICD-10-CM

## 2019-08-19 NOTE — Telephone Encounter (Signed)
Patient was informed that was the correct medication

## 2019-08-19 NOTE — Patient Instructions (Signed)
Peripheral Vascular Disease  Peripheral vascular disease (PVD) is a disease of the blood vessels that are not part of your heart and brain. A simple term for PVD is poor circulation. In most cases, PVD narrows the blood vessels that carry blood from your heart to the rest of your body. This can reduce the supply of blood to your arms, legs, and internal organs, like your stomach or kidneys. However, PVD most often affects a person's lower legs and feet. Without treatment, PVD tends to get worse. PVD can also lead to acute ischemic limb. This is when an arm or leg suddenly cannot get enough blood. This is a medical emergency. Follow these instructions at home: Lifestyle  Do not use any products that contain nicotine or tobacco, such as cigarettes and e-cigarettes. If you need help quitting, ask your doctor.  Lose weight if you are overweight. Or, stay at a healthy weight as told by your doctor.  Eat a diet that is low in fat and cholesterol. If you need help, ask your doctor.  Exercise regularly. Ask your doctor for activities that are right for you. General instructions  Take over-the-counter and prescription medicines only as told by your doctor.  Take good care of your feet: ? Wear comfortable shoes that fit well. ? Check your feet often for any cuts or sores.  Keep all follow-up visits as told by your doctor This is important. Contact a doctor if:  You have cramps in your legs when you walk.  You have leg pain when you are at rest.  You have coldness in a leg or foot.  Your skin changes.  You are unable to get or have an erection (erectile dysfunction).  You have cuts or sores on your feet that do not heal. Get help right away if:  Your arm or leg turns cold, numb, and blue.  Your arms or legs become red, warm, swollen, painful, or numb.  You have chest pain.  You have trouble breathing.  You suddenly have weakness in your face, arm, or leg.  You become very  confused or you cannot speak.  You suddenly have a very bad headache.  You suddenly cannot see. Summary  Peripheral vascular disease (PVD) is a disease of the blood vessels.  A simple term for PVD is poor circulation. Without treatment, PVD tends to get worse.  Treatment may include exercise, low fat and low cholesterol diet, and quitting smoking. This information is not intended to replace advice given to you by your health care provider. Make sure you discuss any questions you have with your health care provider. Document Revised: 01/09/2017 Document Reviewed: 03/06/2016 Elsevier Patient Education  2020 [employer].

## 2019-08-19 NOTE — Assessment & Plan Note (Signed)
Stable 1-39% stenosis in the ICA bilaterally.  Stable from previous studies.  No role for intervention.  Continue current medical regimen.  Recheck in 1 year.

## 2019-08-19 NOTE — Assessment & Plan Note (Signed)
ABIs today are stable to slightly improved at 1.2 on the right and 0.71 on the left.  He was previously in the 0.6 range on the left and his increased walking has increased his resting ABI.  His symptoms are worse however.  Given his multiple failed previous interventions and no current limb threatening symptoms, I am hesitant to proceed with any invasive therapies.  He is a reasonably good candidate to try Zontivity to see if that improves his claudication symptoms to be managed conservatively.  A prescription for Zontivity 2.08 mg daily was given today.  He will continue his other medications.  We will continue to follow him on an annual basis, but will be happy to see him sooner if any problems develop in the interim.

## 2019-08-19 NOTE — Progress Notes (Signed)
MRN : [id number]  Antonio [name] is a 74 y.o. (06-May-1945) male who presents with chief complaint of  Chief Complaint  Patient presents with  . Follow-up    Follow up U/S   .  History of Present Illness: Patient returns today in follow up of multiple vascular issues.  He has started a more formal exercise program for his COPD, and he is noticing increasing claudication symptoms after about 8 to 10 minutes of walking.  The left calf and lower leg cramp and he has to stop and rest.  This is becoming increasingly bothersome to him.  No open wounds or infection.  No rest pain.  He has had multiple previous left leg interventions many years ago that failed. ABIs today are stable to slightly improved at 1.2 on the right and 0.71 on the left.  He was previously in the 0.6 range on the left and his increased walking has increased his resting ABI.  His symptoms are worse however. He is also study for his carotid disease today.  He has no focal neurologic symptoms. Specifically, the patient denies amaurosis fugax, speech or swallowing difficulties, or arm or leg weakness or numbness.  Duplex today shows stable 1 to 39% bilateral ICA stenosis.  Current Outpatient Medications  Medication Sig Dispense Refill  . albuterol (PROVENTIL) (2.5 MG/3ML) 0.083% nebulizer solution Take 3 mLs (2.5 mg total) by nebulization every 6 (six) hours as needed for wheezing or shortness of breath. 75 mL 12  . albuterol (VENTOLIN HFA) 108 (90 Base) MCG/ACT inhaler Inhale 2 puffs into the lungs every 6 (six) hours as needed for wheezing or shortness of breath. 8 g 11  . Ascorbic Acid (VITAMIN C PO) Take by mouth.    . Ascorbic Acid 100 MG CHEW Chew by mouth.    . Budeson-Glycopyrrol-Formoterol (BREZTRI AEROSPHERE) 160-9-4.8 MCG/ACT AERO Inhale 2 puffs into the lungs 2 (two) times daily. 5.9 g 0  . budesonide-formoterol (SYMBICORT) 160-4.5 MCG/ACT inhaler Inhale into the lungs.    . cetirizine (ZYRTEC) 10 MG tablet Take  10 mg by mouth daily.    . dupilumab (DUPIXENT) 300 MG/2ML prefilled syringe Inject 300 mg into the skin every 14 (fourteen) days. 4 mL 11  . famotidine (PEPCID) 40 MG tablet Take 1 tablet (40 mg total) by mouth daily. 90 tablet 1  . hydrocortisone (ANUSOL-HC) 25 MG suppository Place 1 suppository (25 mg total) rectally at bedtime. 20 suppository 0  . ipratropium-albuterol (DUONEB) 0.5-2.5 (3) MG/3ML SOLN USE 3 ML VIA NEBULIZER EVERY 6 HOURS (Patient taking differently: 3 mLs every 6 (six) hours as needed. USE 3 ML VIA NEBULIZER EVERY 6 HOURS) 75 mL 2  . ketoconazole (NIZORAL) 2 % shampoo     . loperamide (IMODIUM) 2 MG capsule Take 2 mg by mouth daily as needed for diarrhea or loose stools.     Marland Kitchen MAGNESIUM PO Take by mouth.    . mesalamine (LIALDA) 1.2 g EC tablet TAKE 4 TABLETS BY MOUTH DAILY WITH BREAKFAST 360 tablet 1  . mometasone-formoterol (DULERA) 200-5 MCG/ACT AERO Inhale 2 puffs into the lungs in the morning and at bedtime. 8.8 g 11  . Multiple Vitamins-Minerals (ZINC PO) Take by mouth.    . multivitamin (VIT W/EXTRA C) CHEW chewable tablet Chew by mouth.    . Olodaterol HCl 2.5 MCG/ACT AERS Inhale into the lungs.    Marland Kitchen OVER THE COUNTER MEDICATION Take 1 tablet by mouth daily. ZINC    . Tiotropium Bromide Monohydrate (  SPIRIVA RESPIMAT) 2.5 MCG/ACT AERS Inhale 2 puffs into the lungs daily. 4 g 11  . VITAMIN D PO Take by mouth.     No current facility-administered medications for this visit.    Past Medical History:  Diagnosis Date  . Allergy   . Calf pain   . COPD (chronic obstructive pulmonary disease) (HCC)    no Oxygen per pt  . COVID-19   . Dyspnea   . Hypokalemia   . Internal hemorrhoids   . Internal hemorrhoids   . Muscle cramp   . Muscle pain   . Pneumonia   . PONV (postoperative nausea and vomiting)   . Tubular adenoma of colon   . Ulcerative colitis [facility])     Past Surgical History:  Procedure Laterality Date  . ANTERIOR CERVICAL DECOMP/DISCECTOMY FUSION N/A  05/05/2016   Procedure: ANTERIOR CERVICAL DECOMPRESSION FUSION CERVICAL THREE-FOUR.;  Surgeon: Earnie Larsson, MD;  Location: [facility];  Service: Neurosurgery;  Laterality: N/A;  right side approach  . CERVICAL DISCECTOMY  04/2016   C3-C4  . COLONOSCOPY    . COLONOSCOPY  08/09/2015  . [place]  . leg stent Left approx 5-6 yrs ago   2 to left leg  . MENISCUS REPAIR Left 12/2013  . POLYPECTOMY    . ROTATOR CUFF REPAIR Left 2007  . TONSILLECTOMY AND ADENOIDECTOMY  1957     Social History   Tobacco Use  . Smoking status: Former Smoker    Packs/day: 0.75    Years: 35.00    Pack years: 26.25    Types: Cigarettes, Cigars    Quit date: 06/11/2011    Years since quitting: 8.1  . Smokeless tobacco: Never Used  . Tobacco comment: smoking cessation materials not required  Vaping Use  . Vaping Use: Former  Substance Use Topics  . Alcohol use: Yes    Alcohol/week: 7.0 standard drinks    Types: 7 Standard drinks or equivalent per week    Comment: 1-2 day   . Drug use: No      Family History  Problem Relation Age of Onset  . Cancer Mother        leukemia  . Colon polyps Mother   . Heart disease Father   . Heart attack Father   . Healthy Daughter   . Healthy Son   . Colon cancer Neg Hx   . Esophageal cancer Neg Hx   . Rectal cancer Neg Hx   . Stomach cancer Neg Hx     Allergies  Allergen Reactions  . Penicillins Rash    Has patient had a PCN reaction causing immediate rash, facial/tongue/throat swelling, SOB or lightheadedness with hypotension:Yes Has patient had a PCN reaction causing severe rash involving mucus membranes or skin necrosis:Yes Has patient had a PCN reaction that required hospitalization:No Has patient had a PCN reaction occurring within the last 10 years:No If all of the above answers are "NO", then may proceed with Cephalosporin use.      REVIEW OF SYSTEMS(Negative unless checked)  Constitutional: [] ??Weight  loss[] ??Fever[] ??Chills Cardiac:[] ??Chest pain[] ??Chest pressure[] ??Palpitations [] ??Shortness of breath when laying flat [] ??Shortness of breath at rest [] ??Shortness of breath with exertion. Vascular: [x] ??Pain in legs with walking[] ??Pain in legsat rest[] ??Pain in legs when laying flat [x] ??Claudication [] ??Pain in feet when walking [] ??Pain in feet at rest [] ??Pain in feet when laying flat [] ??History of DVT [] ??Phlebitis [] ??Swelling in legs [] ??Varicose veins [] ??Non-healing ulcers Pulmonary: [] ??Uses home oxygen [] ??Productive cough[] ??Hemoptysis [] ??Wheeze [] ??COPD [] ??Asthma Neurologic: [] ??Dizziness [] ??Blackouts [] ??Seizures [] ??History  of stroke [] ??History of TIA[] ??Aphasia [] ??Temporary blindness[] ??Dysphagia [x] ??Weaknessor numbness in arms [] ??Weakness or numbnessin legs Musculoskeletal: [] ??Arthritis [] ??Joint swelling [] ??Joint pain [] ??Low back pain Hematologic:[] ??Easy bruising[] ??Easy bleeding [] ??Hypercoagulable state [] ??Anemic  Gastrointestinal:[] ??Blood in stool[] ??Vomiting blood[] ??Gastroesophageal reflux/heartburn[] ??Abdominal pain Genitourinary: [] ??Chronic kidney disease [] ??Difficulturination [] ??Frequenturination [] ??Burning with urination[] ??Hematuria Skin: [] ??Rashes [] ??Ulcers [] ??Wounds Psychological: [] ??History of anxiety[] ??History of major depression.  Physical Examination  BP 138/74   Pulse (!) 59   Ht 5' 11"  (1.803 m)   Wt 159 lb (72.1 kg)   BMI 22.18 kg/m  Gen:  WD/WN, NAD Head: [place]/AT, No temporalis wasting. Ear/Nose/Throat: Hearing grossly intact, nares w/o erythema or drainage Eyes: Conjunctiva clear. Sclera non-icteric Neck: Supple.  Trachea midline Pulmonary:  Good air movement, no use of accessory muscles.  Cardiac: RRR, no JVD Vascular:  Vessel Right Left  Radial Palpable Palpable                          PT  Palpable 1+ Palpable  DP Palpable Trace Palpable   Gastrointestinal: soft, non-tender/non-distended. No guarding/reflex.  Musculoskeletal: M/S 5/5 throughout.  No deformity or atrophy. No edema. Neurologic: Sensation grossly intact in extremities.  Symmetrical.  Speech is fluent.  Psychiatric: Judgment intact, Mood & affect appropriate for pt's clinical situation. Dermatologic: No rashes or ulcers noted.  No cellulitis or open wounds.       Labs No results found for this or any previous visit (from the past 2160 hour(s)).  Radiology No results found.  Assessment/Plan  Chronic obstructive airway disease (HCC) Fairly severe.  Has started a walking regimen so this has made his claudication more noticeable.   Carotid stenosis Stable 1-39% stenosis in the ICA bilaterally.  Stable from previous studies.  No role for intervention.  Continue current medical regimen.  Recheck in 1 year.  Atherosclerosis of native arteries of extremity with intermittent claudication (HCC) ABIs today are stable to slightly improved at 1.2 on the right and 0.71 on the left.  He was previously in the 0.6 range on the left and his increased walking has increased his resting ABI.  His symptoms are worse however.  Given his multiple failed previous interventions and no current limb threatening symptoms, I am hesitant to proceed with any invasive therapies.  He is a reasonably good candidate to try Zontivity to see if that improves his claudication symptoms to be managed conservatively.  A prescription for Zontivity 2.08 mg daily was given today.  He will continue his other medications.  We will continue to follow him on an annual basis, but will be happy to see him sooner if any problems develop in the interim.    Leotis Pain, MD  08/19/2019 11:13 AM    This note was created with Dragon medical transcription system.  Any errors from dictation are purely unintentional

## 2019-08-19 NOTE — Assessment & Plan Note (Signed)
Fairly severe.  Has started a walking regimen so this has made his claudication more noticeable.

## 2019-08-19 NOTE — Telephone Encounter (Signed)
Message -----      [place]      Sent:08/19/2019 12:15 PM EDT        VN:[id number] Care Team   Subject:Appointment Scheduled  Is this the correct name for the prescription Dr. Lucky Cowboy gave me? ZONTIVITY. 2.08 mg. TPOQD   ----- Message -----      From:MyChart Care Team      Sent:08/19/2019 10:45 AM EDT        BI:PJRP Antonio [name]   Subject:Appointment Scheduled  Appointment Information:     Visit Type: Follow Up Vascular         Date: 08/21/2020                 Dept: [facility]                 Provider: Leotis Pain                 Time: 11:15 AM                 Length: 15 min   Appt Status: Scheduled

## 2019-08-22 MED ORDER — BREZTRI AEROSPHERE 160-9-4.8 MCG/ACT IN AERO: 2.0000 | INHALATION_SPRAY | Freq: Two times a day (BID) | RESPIRATORY_TRACT | 6 refills | Status: DC

## 2019-08-22 NOTE — Telephone Encounter (Signed)
Ran test claims for 1 month supply:  Trelegy 231m- $45.00  Breztri- $45.00

## 2019-08-22 NOTE — Telephone Encounter (Signed)
Please contact the patient and let him know the results from test claims from pharmacy team.  Would recommend that if the patient is tolerating the [facility] well that he remain on that.  If tolerating well okay to send in:  Breztri >>> 2 puffs in the morning right when you wake up, rinse out your mouth after use, 12 hours later 2 puffs, rinse after use >>> Take this daily, no matter what >>> This is not a rescue inhaler  Refills 6  [name] FNP

## 2019-09-02 DIAGNOSIS — I471 Supraventricular tachycardia: Secondary | ICD-10-CM | POA: Diagnosis not present

## 2019-09-02 DIAGNOSIS — Z8616 Personal history of COVID-19: Secondary | ICD-10-CM | POA: Diagnosis not present

## 2019-09-02 DIAGNOSIS — Z880 Allergy status to penicillin: Secondary | ICD-10-CM | POA: Diagnosis not present

## 2019-09-02 DIAGNOSIS — Z7951 Long term (current) use of inhaled steroids: Secondary | ICD-10-CM | POA: Diagnosis not present

## 2019-09-02 DIAGNOSIS — I4891 Unspecified atrial fibrillation: Secondary | ICD-10-CM | POA: Diagnosis not present

## 2019-09-02 DIAGNOSIS — I480 Paroxysmal atrial fibrillation: Secondary | ICD-10-CM | POA: Insufficient documentation

## 2019-09-02 DIAGNOSIS — Z87891 Personal history of nicotine dependence: Secondary | ICD-10-CM | POA: Diagnosis not present

## 2019-09-02 DIAGNOSIS — J449 Chronic obstructive pulmonary disease, unspecified: Secondary | ICD-10-CM | POA: Diagnosis not present

## 2019-09-02 DIAGNOSIS — U071 COVID-19: Secondary | ICD-10-CM | POA: Diagnosis not present

## 2019-09-05 ENCOUNTER — Telehealth: Payer: Self-pay | Admitting: Family Medicine

## 2019-09-05 NOTE — Telephone Encounter (Signed)
Copied from [place] [id number]. Topic: General - Other >> Sep 05, 2019 10:49 AM Celene Kras wrote: Reason for CRM: Pts wife called stating that the pt  had a rapid heartbeat on Friday and that the pt was admitted into ER. She states that the pt has an appt with cardiology tomorrow in [place]. Please advise.

## 2019-09-05 NOTE — Telephone Encounter (Signed)
Patient just needs to follow up with cardiology tomorrow at his appt.  Thanks.  CM

## 2019-09-06 DIAGNOSIS — I480 Paroxysmal atrial fibrillation: Secondary | ICD-10-CM | POA: Diagnosis not present

## 2019-09-09 DIAGNOSIS — I4891 Unspecified atrial fibrillation: Secondary | ICD-10-CM | POA: Diagnosis not present

## 2019-09-09 DIAGNOSIS — I480 Paroxysmal atrial fibrillation: Secondary | ICD-10-CM | POA: Diagnosis not present

## 2019-09-29 NOTE — Telephone Encounter (Signed)
Aaron Edelman please advise on patient mychart message  Should I sign up to get this booster, I've had both primary one from [employer]  ?

## 2019-09-29 NOTE — Telephone Encounter (Signed)
09/29/2019  The current stance of our office as well as [place] is that for patients on daily steroids of 20 mg or greater we would recommend the COVID-19 booster vaccine.  It would not be recommended for you at this time.  [name] FNP

## 2019-10-03 NOTE — Telephone Encounter (Signed)
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 Hemorrhoidal banding would likely be a good option though with his history of ulcerative proctitis, this can be a bit uncertain. I would be willing to try hemorrhoidal banding for his ongoing rectal bleeding if he is interested given that we believe as documented by histology last summer, that his ulcerative proctitis is in remission If he is interested, please schedule banding appointments

## 2019-10-06 ENCOUNTER — Telehealth: Payer: Self-pay | Admitting: Pulmonary Disease

## 2019-10-11 NOTE — Telephone Encounter (Signed)
Encounter opened in error. Will sign off.

## 2019-11-07 ENCOUNTER — Telehealth: Payer: Self-pay | Admitting: Internal Medicine

## 2019-11-07 NOTE — Telephone Encounter (Signed)
Pt is requesting a call back from a nurse to discuss a procedure he needs scheduled with Dr Hilarie Fredrickson regarding his hemorrhoids.

## 2019-11-07 NOTE — Telephone Encounter (Signed)
Pt aware and scheduled for banding 12/20/19@11am . Pt aware of appt.

## 2019-11-07 NOTE — Telephone Encounter (Signed)
I would recommend we not interrupt Eliquis therapy for hemorrhoidal banding. Post-banding bleeding can occur for up to 3 weeks after each banding and it is not reasonable to hold anticoagulation this long due to associated risks of being off Eliquis That said, banding on Eliquis does have a higher risk of bleeding once the band falls off than if he was not on Eliquis.  The risk on Eliquis is not exactly known but less than 10%. I do think his hemorrhoids will improve with banding therapy and if he is willing appts can be scheduled. Make him aware he will likely need 3 separate banding sessions with 1 band placed each time (this is standard with or without Eliquis) Thanks

## 2019-11-07 NOTE — Telephone Encounter (Signed)
Pt called wanting to schedule Hem banding, stated that he and Dr. Hilarie Fredrickson had discussed this and he is ready to schedule. Pt states he takes Eliquis, this is managed by Dr. Waymond Cera at [facility]. Please advise how long you would like him to hold the eliquis and request can be sent to Dr. Gilford Rile.

## 2019-11-08 DIAGNOSIS — I739 Peripheral vascular disease, unspecified: Secondary | ICD-10-CM | POA: Insufficient documentation

## 2019-11-08 DIAGNOSIS — Z880 Allergy status to penicillin: Secondary | ICD-10-CM | POA: Diagnosis not present

## 2019-11-08 DIAGNOSIS — I480 Paroxysmal atrial fibrillation: Secondary | ICD-10-CM | POA: Diagnosis not present

## 2019-11-08 DIAGNOSIS — J449 Chronic obstructive pulmonary disease, unspecified: Secondary | ICD-10-CM | POA: Diagnosis not present

## 2019-11-08 DIAGNOSIS — Z7901 Long term (current) use of anticoagulants: Secondary | ICD-10-CM | POA: Diagnosis not present

## 2019-11-16 ENCOUNTER — Other Ambulatory Visit: Payer: Self-pay

## 2019-11-16 ENCOUNTER — Ambulatory Visit: Payer: PPO | Admitting: Pulmonary Disease

## 2019-11-16 ENCOUNTER — Encounter: Payer: Self-pay | Admitting: Pulmonary Disease

## 2019-11-16 VITALS — BP 102/62 | HR 61 | Temp 97.4°F | Ht 71.0 in | Wt 152.6 lb

## 2019-11-16 DIAGNOSIS — J300 Vasomotor rhinitis: Secondary | ICD-10-CM | POA: Diagnosis not present

## 2019-11-16 DIAGNOSIS — J4550 Severe persistent asthma, uncomplicated: Secondary | ICD-10-CM | POA: Diagnosis not present

## 2019-11-16 DIAGNOSIS — Z79899 Other long term (current) drug therapy: Secondary | ICD-10-CM | POA: Diagnosis not present

## 2019-11-16 DIAGNOSIS — Z8616 Personal history of COVID-19: Secondary | ICD-10-CM

## 2019-11-16 DIAGNOSIS — J449 Chronic obstructive pulmonary disease, unspecified: Secondary | ICD-10-CM | POA: Diagnosis not present

## 2019-11-16 MED ORDER — BREZTRI AEROSPHERE 160-9-4.8 MCG/ACT IN AERO: 2.0000 | INHALATION_SPRAY | Freq: Two times a day (BID) | RESPIRATORY_TRACT | 0 refills | Status: DC

## 2019-11-16 MED ORDER — ALBUTEROL SULFATE HFA 108 (90 BASE) MCG/ACT IN AERS: 2.0000 | INHALATION_SPRAY | Freq: Four times a day (QID) | RESPIRATORY_TRACT | 2 refills | Status: DC | PRN

## 2019-11-16 MED ORDER — AZELASTINE HCL 0.1 % NA SOLN: 1.0000 | Freq: Two times a day (BID) | NASAL | 12 refills | Status: DC

## 2019-11-16 NOTE — Progress Notes (Signed)
@Patient  ID: Antonio [name], male    DOB: 1945-03-07, 74 y.o.   MRN: [id number]  Chief Complaint  Patient presents with  . Follow-up    Judithann Sauger working well for him.  He is using his rescue inhaler once daily on average and neb also once per day.     Referring provider: [name], MD  HPI:  74 year old male former smoker followed in our office for severe COPD with emphysema  PMH: CAD, arthrosclerosis Smoker/ Smoking History: Former smoker quit 2013.  26.25 pack years Maintenance: Breztri,  Dupixent  Pt of: Dr. Vaughan Browner   11/16/2019  - Visit   74 year old male former smoker followed in our office for severe COPD.  He has enjoyed [employer], academic and would like to remain on it.  He still has to use his rescue inhaler 2-3 times a day.  He is requesting a refill of this today.  He continues be maintained on Dupixent.  He has an upcoming physical with primary care next month.  He has been more active as of late but is still struggling with consistent physical exercise.  Patient is also struggling with eating multiple meals a day.  Right now he is averaging 2 small meals a day sometimes 1 of those being a protein packed shake.  We will discuss this.  He reports that even with eating smaller meals he sometimes feels quite full.  He is not like to eat late at night due to the symptoms.  Unfortunately the patient has entered the donut hole.  He is wondering if we have samples today of his inhaler.  Patient is up-to-date with COVID-19 vaccinations as well as the flu vaccine.  Pulmonary Flowsheets:   ACT:  Asthma Control Test ACT Total Score  11/16/2019 16  08/16/2019 14    MMRC: mMRC Dyspnea Scale mMRC Score  11/16/2019 1  05/02/2019 2    Epworth:  No flowsheet data found.  Tests:   06/09/2014 pulmonary function test> ratio 51%, FEV1 1.47 L (40% predicted), total lung capacity 5.43 L (71% predicted). DLCO 16.26 (44% predicted)  December 2017 lung function testing showed ratio  50%, FEV1 1.47 L 43% predicted, FVC 2.96 L 67% predicted  March 2020 pulmonary function testing ratio 38%, FEV1 0.97 L 31% predicted, total lung capacity 8.44 L 119% predicted, residual volume 198% predicted, DLCO 14.0 55% predicted  March 2020 high-resolution CT scan of the chest images : Centrilobular emphysema, no interstitial lung disease, aortic atherosclerosis  04/20/2018-eosinophils relative 7.3, eosinophils absolute 0.6 04/20/2018-IgE-67  01/20/2019-SARS-CoV-2-detected   FENO:  No results found for: NITRICOXIDE  PFT: PFT Results Latest Ref Rng & Units 04/20/2018 06/07/2014  FVC-Pre L 2.56 2.80  FVC-Predicted Pre % 60 57  FVC-Post L 2.60 2.90  FVC-Predicted Post % 61 59  Pre FEV1/FVC % % 38 52  Post FEV1/FCV % % 39 51  FEV1-Pre L 0.97 1.46  FEV1-Predicted Pre % 31 40  FEV1-Post L 1.02 1.47  DLCO uncorrected ml/min/mmHg 14.01 16.26  DLCO UNC% % 55 44  DLVA Predicted % 68 55  TLC L 8.44 -  TLC % Predicted % 119 -  RV % Predicted % 198 -    WALK:  SIX MIN WALK 08/16/2019 03/11/2018 08/03/2017 04/25/2014  Supplimental Oxygen during Test? (L/min) No No - No  2 Minute Oxygen Saturation % - - 90 -  2 Minute HR - - 67 -  4 Minute Oxygen Saturation % - - 87 -  4 Minute HR - -  69 -  6 Minute Oxygen Saturation % - - 85 -  6 Minute HR - - 84 -  Tech Comments: moderate pace with desats patient walked at fast pace; winded not short of breath, patient tolerated well  - pt walked a moderately fast pace, tolerated walk well.     Imaging: No results found.  Lab Results:  CBC    Component Value Date/Time   WBC 8.6 04/20/2018 1551   RBC 4.71 04/20/2018 1551   HGB 14.9 04/20/2018 1551   HGB 15.2 11/26/2015 0933   HCT 44.0 04/20/2018 1551   HCT 43.9 11/26/2015 0933   PLT 218.0 04/20/2018 1551   PLT 247 11/26/2015 0933   MCV 93.4 04/20/2018 1551   MCV 90 11/26/2015 0933   MCV 95 10/01/2013 2131   MCH 31.0 04/28/2016 1433   MCHC 33.9 04/20/2018 1551   RDW 12.6 04/20/2018 1551    RDW 13.6 11/26/2015 0933   RDW 12.4 10/01/2013 2131   LYMPHSABS 2.0 04/20/2018 1551   MONOABS 1.3 (H) 04/20/2018 1551   EOSABS 0.6 04/20/2018 1551   BASOSABS 0.1 04/20/2018 1551    BMET    Component Value Date/Time   NA 139 12/13/2018 0945   NA 138 10/01/2013 2131   K 4.5 12/13/2018 0945   K 4.2 10/01/2013 2131   CL 101 12/13/2018 0945   CL 107 10/01/2013 2131   CO2 25 12/13/2018 0945   CO2 23 10/01/2013 2131   GLUCOSE 106 (H) 12/13/2018 0945   GLUCOSE 72 05/28/2016 1450   GLUCOSE 118 (H) 10/01/2013 2131   BUN 9 12/13/2018 0945   BUN 11 10/01/2013 2131   CREATININE 1.08 12/13/2018 0945   CREATININE 0.93 10/01/2013 2131   CALCIUM 9.8 12/13/2018 0945   CALCIUM 8.7 10/01/2013 2131   GFRNONAA 67 12/13/2018 0945   GFRNONAA >60 10/01/2013 2131   GFRAA 77 12/13/2018 0945   GFRAA >60 10/01/2013 2131    BNP No results found for: BNP  ProBNP No results found for: PROBNP  Specialty Problems      Pulmonary Problems   Centrilobular Emphysema    Noted on 04/2014 CXR 06/09/2014 pulmonary function test> ratio 51%, FEV1 1.47 L (40% predicted), total lung capacity 5.43 L (71% predicted). DLCO 16.26 (44% predicted)      COPD with acute exacerbation (HCC)   Diffusion capacity of lung (dl), decreased   Chronic obstructive airway disease (HCC)   Severe persistent asthma without complication   Vasomotor rhinitis      Allergies  Allergen Reactions  . Penicillins Rash    Has patient had a PCN reaction causing immediate rash, facial/tongue/throat swelling, SOB or lightheadedness with hypotension:Yes Has patient had a PCN reaction causing severe rash involving mucus membranes or skin necrosis:Yes Has patient had a PCN reaction that required hospitalization:No Has patient had a PCN reaction occurring within the last 10 years:No If all of the above answers are "NO", then may proceed with Cephalosporin use.     Immunization History  Administered Date(s) Administered  .  Influenza Inj Mdck Quad Pf 10/28/2017  . Influenza Split 11/24/2013  . Influenza, High Dose Seasonal PF 11/05/2016, 10/29/2017, 10/06/2018, 11/03/2019  . Influenza,inj,Quad PF,6+ Mos 11/08/2014, 11/26/2015  . Influenza-Unspecified 10/11/2013, 10/11/2013  . PFIZER SARS-COV-2 Vaccination 05/06/2019, 05/31/2019  . Pneumococcal Conjugate-13 04/18/2014  . Pneumococcal Polysaccharide-23 11/26/2015  . Tdap 02/13/2011  . Zoster 11/07/2010  . Zoster Recombinat (Shingrix) 12/01/2016, 03/02/2017    Past Medical History:  Diagnosis Date  . Allergy   .  Calf pain   . COPD (chronic obstructive pulmonary disease) (HCC)    no Oxygen per pt  . COVID-19   . Dyspnea   . Hypokalemia   . Internal hemorrhoids   . Internal hemorrhoids   . Muscle cramp   . Muscle pain   . Pneumonia   . PONV (postoperative nausea and vomiting)   . Tubular adenoma of colon   . Ulcerative colitis ([place])     Tobacco History: Social History   Tobacco Use  Smoking Status Former Smoker  . Packs/day: 0.75  . Years: 35.00  . Pack years: 26.25  . Types: Cigarettes, Cigars  . Quit date: 06/11/2011  . Years since quitting: 8.4  Smokeless Tobacco Never Used  Tobacco Comment   smoking cessation materials not required   Counseling given: Not Answered Comment: smoking cessation materials not required   Continue to not smoke  Outpatient Encounter Medications as of 11/16/2019  Medication Sig  . albuterol (PROVENTIL) (2.5 MG/3ML) 0.083% nebulizer solution Take 3 mLs (2.5 mg total) by nebulization every 6 (six) hours as needed for wheezing or shortness of breath.  Marland Kitchen albuterol (VENTOLIN HFA) 108 (90 Base) MCG/ACT inhaler Inhale 2 puffs into the lungs every 6 (six) hours as needed for wheezing or shortness of breath.  . Ascorbic Acid (VITAMIN C PO) Take by mouth.  . Ascorbic Acid 100 MG CHEW Chew by mouth.  . Budeson-Glycopyrrol-Formoterol (BREZTRI AEROSPHERE) 160-9-4.8 MCG/ACT AERO Inhale 2 puffs into the lungs in the  morning and at bedtime. Rinse mouth after each use  . dupilumab (DUPIXENT) 300 MG/2ML prefilled syringe Inject 300 mg into the skin every 14 (fourteen) days.  Marland Kitchen ELIQUIS 5 MG TABS tablet Take 5 mg by mouth 2 (two) times daily.  . famotidine (PEPCID) 40 MG tablet Take 1 tablet (40 mg total) by mouth daily.  . hydrocortisone (ANUSOL-HC) 25 MG suppository Place 1 suppository (25 mg total) rectally at bedtime.  Marland Kitchen ipratropium-albuterol (DUONEB) 0.5-2.5 (3) MG/3ML SOLN USE 3 ML VIA NEBULIZER EVERY 6 HOURS (Patient taking differently: 3 mLs every 6 (six) hours as needed. USE 3 ML VIA NEBULIZER EVERY 6 HOURS)  . ketoconazole (NIZORAL) 2 % shampoo   . loperamide (IMODIUM) 2 MG capsule Take 2 mg by mouth daily as needed for diarrhea or loose stools.   Marland Kitchen MAGNESIUM PO Take by mouth.  . mesalamine (LIALDA) 1.2 g EC tablet TAKE 4 TABLETS BY MOUTH DAILY WITH BREAKFAST  . metoprolol succinate (TOPROL-XL) 25 MG 24 hr tablet Take 25 mg by mouth daily.  . Multiple Vitamins-Minerals (ZINC PO) Take by mouth.  . multivitamin (VIT W/EXTRA C) CHEW chewable tablet Chew by mouth.  . Olodaterol HCl 2.5 MCG/ACT AERS Inhale into the lungs.  Marland Kitchen OVER THE COUNTER MEDICATION Take 1 tablet by mouth daily. ZINC  . VITAMIN D PO Take by mouth.  . [DISCONTINUED] albuterol (VENTOLIN HFA) 108 (90 Base) MCG/ACT inhaler Inhale 2 puffs into the lungs every 6 (six) hours as needed for wheezing or shortness of breath.  Marland Kitchen azelastine (ASTELIN) 0.1 % nasal spray Place 1 spray into both nostrils 2 (two) times daily. Use in each nostril as directed  . Budeson-Glycopyrrol-Formoterol (BREZTRI AEROSPHERE) 160-9-4.8 MCG/ACT AERO Inhale 2 puffs into the lungs 2 (two) times daily.  . [DISCONTINUED] Budeson-Glycopyrrol-Formoterol (BREZTRI AEROSPHERE) 160-9-4.8 MCG/ACT AERO Inhale 2 puffs into the lungs 2 (two) times daily.  . [DISCONTINUED] budesonide-formoterol (SYMBICORT) 160-4.5 MCG/ACT inhaler Inhale into the lungs.  . [DISCONTINUED] cetirizine  (ZYRTEC) 10 MG tablet Take  10 mg by mouth daily.  . [DISCONTINUED] mometasone-formoterol (DULERA) 200-5 MCG/ACT AERO Inhale 2 puffs into the lungs in the morning and at bedtime.  . [DISCONTINUED] Tiotropium Bromide Monohydrate (SPIRIVA RESPIMAT) 2.5 MCG/ACT AERS Inhale 2 puffs into the lungs daily.   No facility-administered encounter medications on file as of 11/16/2019.     Review of Systems  Review of Systems  Constitutional: Negative for activity change, chills, fatigue, fever and unexpected weight change.  HENT: Positive for congestion, postnasal drip and rhinorrhea. Negative for sinus pressure, sinus pain and sore throat.   Eyes: Negative.   Respiratory: Positive for cough, shortness of breath and wheezing.   Cardiovascular: Negative for chest pain and palpitations.  Gastrointestinal: Negative for constipation, diarrhea, nausea and vomiting.  Endocrine: Negative.   Genitourinary: Negative.   Musculoskeletal: Negative.   Skin: Negative.   Neurological: Negative for dizziness and headaches.  Psychiatric/Behavioral: Negative.  Negative for dysphoric mood. The patient is not nervous/anxious.   All other systems reviewed and are negative.    Physical Exam  BP 102/62 (BP Location: Left Arm, Cuff Size: Normal)   Pulse 61   Temp (!) 97.4 F (36.3 C) (Temporal)   Ht 5' 11"  (1.803 m)   Wt 152 lb 9.6 oz (69.2 kg)   SpO2 98% Comment: on RA  BMI 21.28 kg/m   Wt Readings from Last 5 Encounters:  11/16/19 152 lb 9.6 oz (69.2 kg)  08/19/19 159 lb (72.1 kg)  08/16/19 153 lb 9.6 oz (69.7 kg)  07/19/19 152 lb 2 oz (69 kg)  05/02/19 151 lb 9.6 oz (68.8 kg)    BMI Readings from Last 5 Encounters:  11/16/19 21.28 kg/m  08/19/19 22.18 kg/m  08/16/19 21.42 kg/m  07/19/19 22.46 kg/m  05/02/19 21.14 kg/m     Physical Exam Vitals and nursing note reviewed.  Constitutional:      General: He is not in acute distress.    Appearance: Normal appearance. He is normal weight.    HENT:     Head: Normocephalic and atraumatic.     Right Ear: Hearing, tympanic membrane, ear canal and external ear normal. There is no impacted cerumen.     Left Ear: Hearing, tympanic membrane, ear canal and external ear normal. There is no impacted cerumen.     Nose: Rhinorrhea present. No mucosal edema.     Right Turbinates: Not enlarged.     Left Turbinates: Not enlarged.     Mouth/Throat:     Mouth: Mucous membranes are dry.     Pharynx: Oropharynx is clear. No oropharyngeal exudate.     Comments: +pnd Eyes:     Pupils: Pupils are equal, round, and reactive to light.  Cardiovascular:     Rate and Rhythm: Normal rate and regular rhythm.     Pulses: Normal pulses.     Heart sounds: Normal heart sounds. No murmur heard.   Pulmonary:     Effort: Pulmonary effort is normal.     Breath sounds: No decreased breath sounds, wheezing or rales.     Comments: Diminished breath sounds throughout exam Musculoskeletal:     Cervical back: Normal range of motion.     Right lower leg: No edema.     Left lower leg: No edema.  Lymphadenopathy:     Cervical: No cervical adenopathy.  Skin:    General: Skin is warm and dry.     Capillary Refill: Capillary refill takes less than 2 seconds.     Findings: No erythema  or rash.  Neurological:     General: No focal deficit present.     Mental Status: He is alert and oriented to person, place, and time.     Motor: No weakness.     Coordination: Coordination normal.     Gait: Gait is intact. Gait normal.  Psychiatric:        Mood and Affect: Mood normal.        Behavior: Behavior normal. Behavior is cooperative.        Thought Content: Thought content normal.        Judgment: Judgment normal.       Assessment & Plan:   Vasomotor rhinitis Patient reporting increased nasal drainage especially when eating Suspect this may be vasomotor rhinitis  Plan: Start Astelin nasal spray  Severe persistent asthma without  complication Plan: Continue Breztri, samples provided today [facility] as patient is in the donut hole Continue Dupixent Encourage patient to increase overall physical activity goal should be 30 minutes of physical activity a day 30-monthfollow-up  Centrilobular Emphysema Patient was interested in the Zephyr procedure Reviewed that this is indicated for more patients with bullous emphysema, do not believe patient be a candidate Offered [facility] referral for further review, patient declined at this time  Plan: Continue Breztri  Continue Dupixent Patient up-to-date with flu vaccine Patient up-to-date with COVID-19 vaccine Emphasized need to patient eat nutrient dense foods higher in protein Encourage patient to increase overall physical activity goal should be 30 minutes of physical activity a day 435-monthollow-up  Medication management Currently in the donut hole  Plan: Samples provided today of Breztri [facility]  History of COVID-19 Patient up-to-date with COVID-19 vaccination    Return in about 4 months (around 03/18/2020), or if symptoms worsen or fail to improve, for Follow up with Dr. MaVaughan Browner  BrLauraine RinneNP 11/16/2019   This appointment required 34 minutes of patient care (this includes precharting, chart review, review of results, face-to-face care, etc.).

## 2019-11-16 NOTE — Assessment & Plan Note (Signed)
Currently in the donut hole  Plan: Samples provided today of Breztri [facility] for medication assistance

## 2019-11-16 NOTE — Assessment & Plan Note (Signed)
Patient reporting increased nasal drainage especially when eating Suspect this may be vasomotor rhinitis  Plan: Start Astelin nasal spray

## 2019-11-16 NOTE — Assessment & Plan Note (Signed)
Patient was interested in the Zephyr procedure Reviewed that this is indicated for more patients with bullous emphysema, do not believe patient be a candidate Offered [facility] referral for further review, patient declined at this time  Plan: Continue Breztri  Continue Dupixent Patient up-to-date with flu vaccine Patient up-to-date with COVID-19 vaccine Emphasized need to patient eat nutrient dense foods higher in protein Encourage patient to increase overall physical activity goal should be 30 minutes of physical activity a day 79-monthfollow-up

## 2019-11-16 NOTE — Patient Outreach (Signed)
Received a referral from MD office.  Patient has [employer]. Following their workflow I have sent this referral to the [facility] [employer] group through email [email or web address] for Care Management.

## 2019-11-16 NOTE — Patient Instructions (Addendum)
You were seen today by Lauraine Rinne, NP  for:   1. Severe persistent asthma without complication 2. Centrilobular Emphysema  Breztri >>> 2 puffs in the morning right when you wake up, rinse out your mouth after use, 12 hours later 2 puffs, rinse after use >>> Take this daily, no matter what >>> This is not a rescue inhaler   Continue Dupixent  Start Astelin nasal spray as discussed  Hold Flonase  Start nasal saline rinses twice daily Use distilled water Shake well Get bottle lukewarm like a baby bottle  Start increasing your overall physical activity.  At a baseline start using 10 minutes of your morning for walking.  Repeat this also in the evening.  Goal should be to increase by 1 minute each week.  Final goal should be a baseline of at least 30 minutes of physical activity a day.  I would like to make sure that your albumin is checked at your physical, this is an important marker to ensure that you are maintaining your protein intake  Continue to work on increasing nutrient dense foods in your diet  I do not believe that you would be an appropriate candidate for the Zephyr procedure given the fact that you do not have bullous emphysema but if you would like to be considered for thyroid like to discuss this further I can discuss with Dr. Vaughan Browner or we can place a referral to [facility]   3. History of COVID-19  Glad you are vaccinated for Covid  4. Vasomotor rhinitis  - azelastine (ASTELIN) 0.1 % nasal spray; Place 1 spray into both nostrils 2 (two) times daily. Use in each nostril as directed  Dispense: 30 mL; Refill: 12  5. Medication management  Samples provided today of Breztri   I will also [facility]   We recommend today:   Meds ordered this encounter  Medications  . azelastine (ASTELIN) 0.1 % nasal spray    Sig: Place 1 spray into both nostrils 2 (two) times daily. Use in each nostril as directed    Dispense:  30 mL    Refill:  12  .  Budeson-Glycopyrrol-Formoterol (BREZTRI AEROSPHERE) 160-9-4.8 MCG/ACT AERO    Sig: Inhale 2 puffs into the lungs 2 (two) times daily.    Dispense:  5.9 g    Refill:  0    Order Specific Question:   Lot Number?    Answer:   [id number]    Order Specific Question:   Expiration Date?    Answer:   04/10/2021    Order Specific Question:   Quantity    Answer:   8    Follow Up:    Return in about 4 months (around 03/18/2020), or if symptoms worsen or fail to improve, for Follow up with Dr. Vaughan Browner.   Notification of test results are managed in the following manner: If there are  any recommendations or changes to the  plan of care discussed in office today,  we will contact you and let you know what they are. If you do not hear from [place], then your results are normal and you can view them through your  MyChart account , or a letter will be sent to you. Thank you again for trusting [place] with your care  - Thank you, [facility]    It is flu season:   >>> Best ways to protect herself from the flu: Receive the yearly flu vaccine, practice good hand hygiene washing  with soap and also using hand sanitizer when available, eat a nutritious meals, get adequate rest, hydrate appropriately       Please contact the office if your symptoms worsen or you have concerns that you are not improving.   Thank you for choosing [facility] for your healthcare, and for allowing [place] to partner with you on your healthcare journey. I am thankful to be able to provide care to you today.   [name] FNP-C    COPD and Physical Activity Chronic obstructive pulmonary disease (COPD) is a long-term (chronic) condition that affects the lungs. COPD is a general term that can be used to describe many different lung problems that cause lung swelling (inflammation) and limit airflow, including chronic bronchitis and emphysema. The main symptom of COPD is shortness of breath, which makes it harder to do even simple  tasks. This can also make it harder to exercise and be active. Talk with your health care provider about treatments to help you breathe better and actions you can take to prevent breathing problems during physical activity. What are the benefits of exercising with COPD? Exercising regularly is an important part of a healthy lifestyle. You can still exercise and do physical activities even though you have COPD. Exercise and physical activity improve your shortness of breath by increasing blood flow (circulation). This causes your heart to pump more oxygen through your body. Moderate exercise can improve your:  Oxygen use.  Energy level.  Shortness of breath.  Strength in your breathing muscles.  Heart health.  Sleep.  Self-esteem and feelings of self-worth.  Depression, stress, and anxiety levels. Exercise can benefit everyone with COPD. The severity of your disease may affect how hard you can exercise, especially at first, but everyone can benefit. Talk with your health care provider about how much exercise is safe for you, and which activities and exercises are safe for you. What actions can I take to prevent breathing problems during physical activity?  Sign up for a pulmonary rehabilitation program. This type of program may include: ? Education about lung diseases. ? Exercise classes that teach you how to exercise and be more active while improving your breathing. This usually involves:  Exercise using your lower extremities, such as a stationary bicycle.  About 30 minutes of exercise, 2 to 5 times per week, for 6 to 12 weeks  Strength training, such as push ups or leg lifts. ? Nutrition education. ? Group classes in which you can talk with others who also have COPD and learn ways to manage stress.  If you use an oxygen tank, you should use it while you exercise. Work with your health care provider to adjust your oxygen for your physical activity. Your resting flow rate is  different from your flow rate during physical activity.  While you are exercising: ? Take slow breaths. ? Pace yourself and do not try to go too fast. ? Purse your lips while breathing out. Pursing your lips is similar to a kissing or whistling position. ? If doing exercise that uses a quick burst of effort, such as weight lifting:  Breathe in before starting the exercise.  Breathe out during the hardest part of the exercise (such as raising the weights). Where to find support You can find support for exercising with COPD from:  Your health care provider.  A pulmonary rehabilitation program.  Your local health department or community health programs.  Support groups, online or in-person. Your health care provider may  be able to recommend support groups. Where to find more information You can find more information about exercising with COPD from:  American Lung Association: ClassInsider.se.  COPD Foundation: https://www.rivera.net/. Contact a health care provider if:  Your symptoms get worse.  You have chest pain.  You have nausea.  You have a fever.  You have trouble talking or catching your breath.  You want to start a new exercise program or a new activity. Summary  COPD is a general term that can be used to describe many different lung problems that cause lung swelling (inflammation) and limit airflow. This includes chronic bronchitis and emphysema.  Exercise and physical activity improve your shortness of breath by increasing blood flow (circulation). This causes your heart to provide more oxygen to your body.  Contact your health care provider before starting any exercise program or new activity. Ask your health care provider what exercises and activities are safe for you. This information is not intended to replace advice given to you by your health care provider. Make sure you discuss any questions you have with your health care provider. Document Revised: 05/19/2018  Document Reviewed: 02/19/2017 Elsevier Patient Education  2020 [employer].

## 2019-11-16 NOTE — Assessment & Plan Note (Signed)
Patient up-to-date with COVID-19 vaccination

## 2019-11-16 NOTE — Assessment & Plan Note (Signed)
Plan: Continue Breztri, samples provided today Referred to [facility] for medication access as patient is in the donut hole Continue Dupixent Encourage patient to increase overall physical activity goal should be 30 minutes of physical activity a day 77-monthfollow-up

## 2019-12-12 ENCOUNTER — Other Ambulatory Visit: Payer: Self-pay | Admitting: *Deleted

## 2019-12-12 MED ORDER — DUPIXENT 300 MG/2ML ~~LOC~~ SOSY: 300.0000 mg | PREFILLED_SYRINGE | SUBCUTANEOUS | 11 refills | Status: DC

## 2019-12-14 ENCOUNTER — Encounter: Payer: Self-pay | Admitting: Family Medicine

## 2019-12-14 ENCOUNTER — Other Ambulatory Visit: Payer: Self-pay

## 2019-12-14 ENCOUNTER — Ambulatory Visit (INDEPENDENT_AMBULATORY_CARE_PROVIDER_SITE_OTHER): Payer: PPO | Admitting: Family Medicine

## 2019-12-14 VITALS — BP 138/80 | HR 80 | Ht 71.0 in | Wt 151.0 lb

## 2019-12-14 DIAGNOSIS — R351 Nocturia: Secondary | ICD-10-CM | POA: Diagnosis not present

## 2019-12-14 DIAGNOSIS — I4892 Unspecified atrial flutter: Secondary | ICD-10-CM

## 2019-12-14 DIAGNOSIS — Z0000 Encounter for general adult medical examination without abnormal findings: Secondary | ICD-10-CM | POA: Diagnosis not present

## 2019-12-14 LAB — POCT URINALYSIS DIPSTICK
Bilirubin, UA: NEGATIVE
Blood, UA: NEGATIVE
Glucose, UA: NEGATIVE
Ketones, UA: NEGATIVE
Leukocytes, UA: NEGATIVE
Nitrite, UA: NEGATIVE
Protein, UA: NEGATIVE
Spec Grav, UA: 1.02
Urobilinogen, UA: 0.2 U/dL
pH, UA: 5

## 2019-12-14 NOTE — Progress Notes (Signed)
Date:  12/14/2019   Name:  Antonio [name]   DOB:  1945/06/26   MRN:  [id number]   Chief Complaint: Annual Exam  Patient is a 74 year old male who presents for a comprehensive physical exam. The patient reports the following problems: none. Health maintenance has been reviewed up to date.   Lab Results  Component Value Date   CREATININE 1.08 12/13/2018   BUN 9 12/13/2018   NA 139 12/13/2018   K 4.5 12/13/2018   CL 101 12/13/2018   CO2 25 12/13/2018   Lab Results  Component Value Date   CHOL 161 12/13/2018   HDL 61 12/13/2018   LDLCALC 88 12/13/2018   TRIG 60 12/13/2018   CHOLHDL 2.6 12/13/2018   No results found for: TSH No results found for: HGBA1C Lab Results  Component Value Date   WBC 8.6 04/20/2018   HGB 14.9 04/20/2018   HCT 44.0 04/20/2018   MCV 93.4 04/20/2018   PLT 218.0 04/20/2018   Lab Results  Component Value Date   ALT 33 04/28/2016   AST 37 04/28/2016   ALKPHOS 70 04/28/2016   BILITOT 0.5 04/28/2016     Review of Systems  Constitutional: Negative for chills and fever.  HENT: Negative for drooling, ear discharge, ear pain and sore throat.   Respiratory: Negative for cough, shortness of breath and wheezing.   Cardiovascular: Negative for chest pain, palpitations and leg swelling.  Gastrointestinal: Negative for abdominal pain, blood in stool, constipation, diarrhea and nausea.  Endocrine: Negative for polydipsia.  Genitourinary: Negative for dysuria, frequency, hematuria and urgency.  Musculoskeletal: Negative for back pain, myalgias and neck pain.  Skin: Negative for rash.  Allergic/Immunologic: Negative for environmental allergies.  Neurological: Negative for dizziness and headaches.  Hematological: Does not bruise/bleed easily.  Psychiatric/Behavioral: Negative for suicidal ideas. The patient is not nervous/anxious.     Patient Active Problem List   Diagnosis Date Noted  . 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  . Stiffness of finger  joint of left hand 04/06/2019  . History of COVID-19 01/20/2019  . Severe persistent asthma without complication 08/10/1599  . Medication management 09/20/2018  . Chronic obstructive airway disease ([place]) 09/20/2018  . 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 Diffusion capacity of lung (dl), decreased 03/11/2018  . COPD with acute exacerbation ([place]) 06/22/2017  . Atrial flutter, paroxysmal ([place]) 10/17/2016  . 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/2016  . PSVT (paroxysmal supraventricular tachycardia) ([place]) 10/17/2016  . Ulcerative colitis with complication ([place]) 09/32/3557  . Cervical stenosis of spinal canal 05/05/2016  . Carotid stenosis 05/02/2016  . Atherosclerosis of native arteries of extremity with intermittent claudication ([place]) 05/02/2016  . Cramping of hands 01/10/2016  . Bilateral carpal tunnel syndrome 12/06/2015  . Diarrhea 06/09/2014  . Centrilobular Emphysema 04/25/2014  . Former smoker 04/25/2014  . Tobacco abuse 04/25/2014    Allergies  Allergen Reactions  . Penicillins Rash    Has patient had a PCN reaction causing immediate rash, facial/tongue/throat swelling, SOB or lightheadedness with hypotension:Yes Has patient had a PCN reaction causing severe rash involving mucus membranes or skin necrosis:Yes Has patient had a PCN reaction that required hospitalization:No Has patient had a PCN reaction occurring within the last 10 years:No If all of the above answers are "NO", then may proceed with Cephalosporin use.     Past Surgical History:  Procedure Laterality Date  . ANTERIOR CERVICAL DECOMP/DISCECTOMY FUSION N/A 05/05/2016   Procedure: ANTERIOR CERVICAL DECOMPRESSION FUSION CERVICAL THREE-FOUR.;  Surgeon: Earnie Larsson, MD;  Location: [place] [facility];  Service: Neurosurgery;  Laterality: N/A;  right side approach  . CERVICAL DISCECTOMY  04/2016   C3-C4  . COLONOSCOPY    . COLONOSCOPY  08/09/2015  . [facility]  . leg stent Left approx 5-6 yrs ago   2 to left leg  .  MENISCUS REPAIR Left 12/2013  . POLYPECTOMY    . ROTATOR CUFF REPAIR Left 2007  . TONSILLECTOMY AND ADENOIDECTOMY  1957    Social History   Tobacco Use  . Smoking status: Former Smoker    Packs/day: 0.75    Years: 35.00    Pack years: 26.25    Types: Cigarettes, Cigars    Quit date: 06/11/2011    Years since quitting: 8.5  . Smokeless tobacco: Never Used  . Tobacco comment: smoking cessation materials not required  Vaping Use  . Vaping Use: Former  Substance Use Topics  . Alcohol use: Yes    Alcohol/week: 7.0 standard drinks    Types: 7 Standard drinks or equivalent per week    Comment: 1-2 day   . Drug use: No     Medication list has been reviewed and updated.  Current Meds  Medication Sig  . albuterol (PROVENTIL) (2.5 MG/3ML) 0.083% nebulizer solution Take 3 mLs (2.5 mg total) by nebulization every 6 (six) hours as needed for wheezing or shortness of breath.  Marland Kitchen albuterol (VENTOLIN HFA) 108 (90 Base) MCG/ACT inhaler Inhale 2 puffs into the lungs every 6 (six) hours as needed for wheezing or shortness of breath.  . Ascorbic Acid 100 MG CHEW Chew by mouth.  Marland Kitchen azelastine (ASTELIN) 0.1 % nasal spray Place 1 spray into both nostrils 2 (two) times daily. Use in each nostril as directed  . Budeson-Glycopyrrol-Formoterol (BREZTRI AEROSPHERE) 160-9-4.8 MCG/ACT AERO Inhale 2 puffs into the lungs 2 (two) times daily.  . dupilumab (DUPIXENT) 300 MG/2ML prefilled syringe Inject 300 mg into the skin every 14 (fourteen) days.  Marland Kitchen ELIQUIS 5 MG TABS tablet Take 5 mg by mouth 2 (two) times daily.  . famotidine (PEPCID) 40 MG tablet Take 1 tablet (40 mg total) by mouth daily.  Marland Kitchen ipratropium-albuterol (DUONEB) 0.5-2.5 (3) MG/3ML SOLN USE 3 ML VIA NEBULIZER EVERY 6 HOURS (Patient taking differently: 3 mLs every 6 (six) hours as needed. USE 3 ML VIA NEBULIZER EVERY 6 HOURS)  . ketoconazole (NIZORAL) 2 % shampoo   . loperamide (IMODIUM) 2 MG capsule Take 2 mg by mouth daily as needed for diarrhea  or loose stools.   Marland Kitchen MAGNESIUM PO Take by mouth.  . mesalamine (LIALDA) 1.2 g EC tablet TAKE 4 TABLETS BY MOUTH DAILY WITH BREAKFAST  . metoprolol succinate (TOPROL-XL) 25 MG 24 hr tablet Take 25 mg by mouth daily.  . Multiple Vitamins-Minerals (ZINC PO) Take by mouth.  . multivitamin (VIT W/EXTRA C) CHEW chewable tablet Chew by mouth.  Marland Kitchen VITAMIN D PO Take by mouth.  . [DISCONTINUED] Ascorbic Acid (VITAMIN C PO) Take by mouth.  . [DISCONTINUED] Budeson-Glycopyrrol-Formoterol (BREZTRI AEROSPHERE) 160-9-4.8 MCG/ACT AERO Inhale 2 puffs into the lungs in the morning and at bedtime. Rinse mouth after each use  . [DISCONTINUED] hydrocortisone (ANUSOL-HC) 25 MG suppository Place 1 suppository (25 mg total) rectally at bedtime.    PHQ 2/9 Scores 12/13/2018 07/21/2018 08/03/2017 04/16/2017  PHQ - 2 Score 0 0 1 0  PHQ- 9 Score 0 - 5 0    No flowsheet data found.  BP Readings from Last 3 Encounters:  12/14/19 138/80  11/16/19  102/62  08/19/19 138/74    Physical Exam Vitals and nursing note reviewed.  Constitutional:      Appearance: Normal appearance. He is well-groomed and normal weight.  HENT:     Head: Normocephalic.     Jaw: There is normal jaw occlusion.     Right Ear: Hearing, tympanic membrane, ear canal and external ear normal. There is no impacted cerumen.     Left Ear: Hearing, tympanic membrane, ear canal and external ear normal. There is no impacted cerumen.     Nose: Nose normal. No congestion or rhinorrhea.     Right Turbinates: Not enlarged.     Left Turbinates: Not enlarged.     Mouth/Throat:     Lips: Pink.     Mouth: Mucous membranes are moist.     Dentition: Normal dentition.     Palate: No mass and lesions.     Pharynx: Oropharynx is clear. No oropharyngeal exudate.  Eyes:     General: Lids are normal. Vision grossly intact. Gaze aligned appropriately. No scleral icterus.       Right eye: No discharge.        Left eye: No discharge.     Conjunctiva/sclera:  Conjunctivae normal.     Pupils: Pupils are equal, round, and reactive to light.     Funduscopic exam:    Right eye: Red reflex present.        Left eye: Red reflex present. Neck:     Thyroid: No thyroid mass, thyromegaly or thyroid tenderness.     Vascular: Normal carotid pulses. No carotid bruit, hepatojugular reflux or JVD.     Trachea: Trachea and phonation normal. No tracheal deviation.  Cardiovascular:     Rate and Rhythm: Normal rate and regular rhythm.     Chest Wall: PMI is not displaced. No thrill.     Pulses: Normal pulses.          Carotid pulses are 2+ on the right side and 2+ on the left side.      Radial pulses are 2+ on the right side and 2+ on the left side.       Femoral pulses are 2+ on the right side and 2+ on the left side.      Popliteal pulses are 2+ on the right side and 2+ on the left side.       Dorsalis pedis pulses are 2+ on the right side and 2+ on the left side.       Posterior tibial pulses are 2+ on the right side and 2+ on the left side.     Heart sounds: Normal heart sounds, S1 normal and S2 normal. No murmur heard.  No systolic murmur is present.  No diastolic murmur is present.  No friction rub. No gallop. No S3 or S4 sounds.   Pulmonary:     Effort: No respiratory distress.     Breath sounds: Normal breath sounds. No decreased breath sounds, wheezing, rhonchi or rales.  Chest:     Breasts:        Right: Normal.        Left: Normal.  Abdominal:     General: Bowel sounds are normal.     Palpations: Abdomen is soft. There is no mass.     Tenderness: There is no abdominal tenderness. There is no right CVA tenderness, left CVA tenderness, guarding or rebound.  Genitourinary:    Penis: Normal and circumcised.      Testes: Normal.  Comments: Rectal exam deferred by patient Musculoskeletal:        General: No tenderness. Normal range of motion.     Cervical back: Normal range of motion and neck supple.     Right lower leg: No edema.     Left  lower leg: No edema.  Lymphadenopathy:     Head:     Right side of head: No submental or submandibular adenopathy.     Left side of head: No submental or submandibular adenopathy.     Cervical: No cervical adenopathy.     Right cervical: No superficial cervical adenopathy.    Left cervical: No superficial cervical adenopathy.  Skin:    General: Skin is warm.     Coloration: Skin is not jaundiced or pale.     Findings: No bruising, erythema, lesion or rash.  Neurological:     General: No focal deficit present.     Mental Status: He is alert and oriented to person, place, and time.     Cranial Nerves: Cranial nerves are intact. No cranial nerve deficit.     Sensory: Sensation is intact.     Motor: Motor function is intact.     Deep Tendon Reflexes: Reflexes are normal and symmetric.  Psychiatric:        Behavior: Behavior is cooperative.     Wt Readings from Last 3 Encounters:  12/14/19 151 lb (68.5 kg)  11/16/19 152 lb 9.6 oz (69.2 kg)  08/19/19 159 lb (72.1 kg)    BP 138/80   Pulse 80   Ht 5' 11"  (1.803 m)   Wt 151 lb (68.5 kg)   BMI 21.06 kg/m   Assessment and Plan: 1. Annual physical exam No subjective/objective concerns noted during history and physical exam.Bliss Othell Jaime is a 74 y.o. male who presents today for his Complete Annual Exam. He feels well. He reports exercising . He reports he is sleeping well.  Immunizations are reviewed and recommendations provided.   Age appropriate screening tests are discussed. Counseling given for risk factor reduction interventions.  We will obtain a renal function panel lipid panel, and CMP.  Obtain a urinalysis for ruling out hematuria. - Renal Function Panel - Lipid Panel With LDL/HDL Ratio - Comprehensive metabolic panel - POCT urinalysis dipstick  2. Nocturia Patient with history of nocturia we will check PSA. - PSA - POCT urinalysis dipstick  3. Atrial flutter, paroxysmal ([place]) She with history of atrial  fibrillation we will check a thyroid panel - Thyroid Panel With TSH

## 2019-12-15 LAB — COMPREHENSIVE METABOLIC PANEL
ALT: 15 IU/L (ref 0–44)
AST: 14 IU/L (ref 0–40)
Albumin/Globulin Ratio: 1.7 (ref 1.2–2.2)
Albumin: 4.8 g/dL — ABNORMAL HIGH (ref 3.7–4.7)
Alkaline Phosphatase: 77 IU/L (ref 44–121)
BUN/Creatinine Ratio: 10 (ref 10–24)
BUN: 9 mg/dL (ref 8–27)
Bilirubin Total: 0.8 mg/dL (ref 0.0–1.2)
CO2: 22 mmol/L (ref 20–29)
Calcium: 9.7 mg/dL (ref 8.6–10.2)
Chloride: 104 mmol/L (ref 96–106)
Creatinine, Ser: 0.94 mg/dL (ref 0.76–1.27)
GFR calc Af Amer: 91 mL/min/{1.73_m2} (ref >59)
GFR calc non Af Amer: 78 mL/min/{1.73_m2} (ref >59)
Globulin, Total: 2.8 g/dL (ref 1.5–4.5)
Glucose: 83 mg/dL (ref 65–99)
Potassium: 4.5 mmol/L (ref 3.5–5.2)
Sodium: 143 mmol/L (ref 134–144)
Total Protein: 7.6 g/dL (ref 6.0–8.5)

## 2019-12-15 LAB — LIPID PANEL WITH LDL/HDL RATIO
Cholesterol, Total: 173 mg/dL (ref 100–199)
HDL: 57 mg/dL (ref >39)
LDL Chol Calc (NIH): 105 mg/dL — ABNORMAL HIGH (ref 0–99)
LDL/HDL Ratio: 1.8 ratio (ref 0.0–3.6)
Triglycerides: 54 mg/dL (ref 0–149)
VLDL Cholesterol Cal: 11 mg/dL (ref 5–40)

## 2019-12-15 LAB — THYROID PANEL WITH TSH
Free Thyroxine Index: 2.6 (ref 1.2–4.9)
T3 Uptake Ratio: 29 % (ref 24–39)
T4, Total: 9.1 ug/dL (ref 4.5–12.0)
TSH: 1.26 u[IU]/mL (ref 0.450–4.500)

## 2019-12-15 LAB — PSA: Prostate Specific Ag, Serum: 0.5 ng/mL (ref 0.0–4.0)

## 2019-12-20 ENCOUNTER — Ambulatory Visit: Payer: PPO | Admitting: Internal Medicine

## 2019-12-20 ENCOUNTER — Encounter: Payer: Self-pay | Admitting: Internal Medicine

## 2019-12-20 VITALS — BP 118/80 | HR 58 | Ht 71.0 in | Wt 151.0 lb

## 2019-12-20 DIAGNOSIS — K5150 Left sided colitis without complications: Secondary | ICD-10-CM

## 2019-12-20 DIAGNOSIS — K648 Other hemorrhoids: Secondary | ICD-10-CM

## 2019-12-20 DIAGNOSIS — R151 Fecal smearing: Secondary | ICD-10-CM

## 2019-12-20 DIAGNOSIS — Z7901 Long term (current) use of anticoagulants: Secondary | ICD-10-CM

## 2019-12-20 NOTE — Patient Instructions (Addendum)
You have been scheduled for hemorrhoidal banding #2 on 02/09/20 at 10:50 am.  Restart Eliquis next Tuesday.   HEMORRHOID BANDING PROCEDURE    FOLLOW-UP CARE   1. The procedure you have had should have been relatively painless since the banding of the area involved does not have nerve endings and there is no pain sensation.  The rubber band cuts off the blood supply to the hemorrhoid and the band may fall off as soon as 48 hours after the banding (the band may occasionally be seen in the toilet bowl following a bowel movement). You may notice a temporary feeling of fullness in the rectum which should respond adequately to plain Tylenol or Motrin.  2. Following the banding, avoid strenuous exercise that evening and resume full activity the next day.  A sitz bath (soaking in a warm tub) or bidet is soothing, and can be useful for cleansing the area after bowel movements.     3. To avoid constipation, take two tablespoons of natural wheat bran, natural oat bran, flax, Benefiber or any over the counter fiber supplement and increase your water intake to 7-8 glasses daily.    4. Unless you have been prescribed anorectal medication, do not put anything inside your rectum for two weeks: No suppositories, enemas, fingers, etc.  5. Occasionally, you may have more bleeding than usual after the banding procedure.  This is often from the untreated hemorrhoids rather than the treated one.  Don't be concerned if there is a tablespoon or so of blood.  If there is more blood than this, lie flat with your bottom higher than your head and apply an ice pack to the area. If the bleeding does not stop within a half an hour or if you feel faint, call our office at [phone] or go to the emergency room.  6. Problems are not common; however, if there is a substantial amount of bleeding, severe pain, chills, fever or difficulty passing urine (very rare) or other problems, you should call us at [phone] or  report to the nearest emergency room.  7. Do not stay seated continuously for more than 2-3 hours for a day or two after the procedure.  Tighten your buttock muscles 10-15 times every two hours and take 10-15 deep breaths every 1-2 hours.  Do not spend more than a few minutes on the toilet if you cannot empty your bowel; instead re-visit the toilet at a later time.

## 2019-12-20 NOTE — Progress Notes (Signed)
Subjective:    Patient ID: Antonio [name], male    DOB: [birth year], 74 y.o.   MRN: [id number]  HPI Antonio [name] is a 74 year old male with a history of ulcerative proctitis/colitis diagnosed in 2015, symptomatic bleeding internal hemorrhoids with fecal smearing, history of adenomatous colon polyps, GERD with dyspepsia, COPD who is here for follow-up.  He is here today with his wife.  I saw him in June 2021.  At that time his hemorrhoids were bothering him we tried hydrocortisone suppository which helped briefly but symptoms have recurred.  He has bleeding on and off with bowel movement.  He has been on Eliquis daily for A. fib though he has not had A. fib that he is aware of since summer 2021.  No chest pain.  Bowel movements are formed and daily they can be soft but not loose or watery.  Blood with bowel movement which is intermittent is bright red and painless.  He does have fecal smearing such that he has to wear a pad in his underwear.  He is using his Lialda 2.4 g daily.  He stopped his Eliquis for 2 days because he is interested in hemorrhoidal banding.  He made this decision to hold it.  He did discuss stopping the medicine long-term with his cardiologist at [facility] and they recommended it be continued   Review of Systems As per HPI, otherwise negative  Current Medications, Allergies, Past Medical History, Past Surgical History, Family History and Social History were reviewed in [employer] record.     Objective:   Physical Exam BP 118/80   Pulse (!) 58   Ht 5' 11"  (1.803 m)   Wt 151 lb (68.5 kg)   SpO2 96%   BMI 21.06 kg/m  Gen: awake, alert, NAD HEENT: anicteri CV: Heart is regular, mildly bradycardic, Pulm: Distant breath sounds Abd: soft, NT/ND, +BS throughout Rectal: Grade 3 prolapsing internal hemorrhoid, no fissure ANOSCOPY: Using a disposable, lubricated, slotted, self-illuminating anoscope, the rectum was intubated without difficulty. The  trochar was removed and the ano-rectum was circumferentially inspected. There were internal hemorrhoids, RA and RP (large) > LL. There was no finding of an anorectal fissure. The rectal mucosa was not inflamed. No neoplasia or other pathology was identified. The inspection was well tolerated.  Ext: no c/c/e Neuro: nonfocal     Assessment & Plan:  74 year old male with a history of ulcerative proctitis/colitis diagnosed in 2015, symptomatic bleeding internal hemorrhoids with fecal smearing, history of adenomatous colon polyps, GERD with dyspepsia, COPD who is here for follow-up.  1.  Symptomatic bleeding internal hemorrhoids with prolapse and fecal smearing/chronic anticoagulation --we spent time today discussing hemorrhoidal banding which I do think is indicated.  We also discussed the risk of post banding bleeding which is less than 1% overall.  This risk goes up and patient is on chronic anticoagulation therapy.  We discussed the risk-benefit analysis regarding leaving him on Eliquis for banding in the post banding.  Versus holding it.  Post banding hemorrhage can occur as long as 2 weeks after banding and so often holding anticoagulation for that long is not reasonable.  He is not in A. fib and is not known to have intermittent A. fib which should lower the overall risk of pausing Eliquis.  We did discuss that GI bleeding is often easier to deal with than stroke.  With this discussion including with his wife present, he is comfortable holding Eliquis for 7 days after hemorrhoidal banding.  He understands that we will perform 3 hemorrhoidal banding's and this will require interruption of Eliquis for 7 days each.   PROCEDURE NOTE:  The patient presents with symptomatic grade 3 internal hemorrhoids, requesting rubber band ligation of his hemorrhoidal disease.  All risks, benefits and alternative forms of therapy were described and informed consent was obtained.   The anorectum was pre-medicated with  0.125% nitroglycerin ointment The decision was made to band the RA internal hemorrhoid, and the [place] was used to perform band ligation without complication.   Digital anorectal examination was then performed to assure proper positioning of the band, and to adjust the banded tissue as required.  The patient was discharged home without pain or other issues.  Dietary and behavioral recommendations were given and along with follow-up instructions.     The following adjunctive treatments were recommended: Resume Eliquis on 12/27/2019  The patient will return as scheduled for follow-up and possible additional banding as required. No complications were encountered and the patient tolerated the procedure well.  2.  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  Bleeding is evident from his internal hemorrhoids rather than proctitis. --Continue Canasa 2.4 g daily --If proctitis symptoms recur would resume Canasa 1 g nightly

## 2020-02-09 ENCOUNTER — Encounter: Payer: Self-pay | Admitting: Internal Medicine

## 2020-02-09 ENCOUNTER — Ambulatory Visit: Payer: PPO | Admitting: Internal Medicine

## 2020-02-09 VITALS — BP 118/60 | HR 57 | Ht 71.0 in | Wt 149.0 lb

## 2020-02-09 DIAGNOSIS — Z7901 Long term (current) use of anticoagulants: Secondary | ICD-10-CM

## 2020-02-09 DIAGNOSIS — K648 Other hemorrhoids: Secondary | ICD-10-CM | POA: Diagnosis not present

## 2020-02-09 DIAGNOSIS — I480 Paroxysmal atrial fibrillation: Secondary | ICD-10-CM | POA: Diagnosis not present

## 2020-02-09 DIAGNOSIS — K219 Gastro-esophageal reflux disease without esophagitis: Secondary | ICD-10-CM

## 2020-02-09 NOTE — Progress Notes (Signed)
Subjective:  Antonio [name] is a 74 year old male with a history of ulcerative proctitis diagnosed in 2015, symptomatic bleeding internal hemorrhoids with fecal smearing who presents for repeat hemorrhoidal banding.  Initial hemorrhoidal banding was performed on 12/20/2019 with interruption of Eliquis therapy for 7 days.  He was to resume this on 12/27/2019.  Today he reports that he did very well with the hemorrhoidal banding and resumed his Eliquis without any issue.  However just last Sunday, 4 days ago he developed spontaneous atrial fibrillation with RVR at home.  This caused him to feel slightly more short of breath and he also developed severe reflux/heartburn symptom and nausea.  He has been off of his famotidine therapy.  He did speak to the cardiologist on-call and he laid down and took an extra dose of his metoprolol.  He was able to get his heart rate from 148-1 22 and he did feel some better.  He did not sleep well that night but by morning his heart rate had returned to the mid 60s and he was not having palpitations, dyspnea or heartburn.  The remainder of this week he has felt back to his baseline though noticed some mild heartburn in the daytime but not at night.  No abdominal pain.  From hemorrhoidal perspective after banding he has had no further rectal bleeding which was one of his predominant hemorrhoidal symptoms.  He has had some still present fecal smearing.  Review of systems:  As per HPI, otherwise negative  Current Medications, Allergies, Past Medical History, Past Surgical History, Family History and Social History were reviewed in [employer] record.  BP 118/60   Pulse (!) 57   Ht 5' 11"  (1.803 m)   Wt 149 lb (67.6 kg)   SpO2 92%   BMI 20.78 kg/m   Gen: awake, alert, NAD HEENT: anicteric, CV: RRR, no mrg Pulm: Distant but clear Abd: soft, NT/ND, +BS throughout Ext: no c/c/e Neuro: nonfocal  74 year old male with a history of ulcerative  proctitis diagnosed in 2015, symptomatic bleeding internal hemorrhoids with fecal smearing who presents for repeat hemorrhoidal banding.  1.  Symptomatic internal hemorrhoids with bleeding and fecal smearing --symptoms have already improved after banding x1.  He stopped his Eliquis 4 days ago for anticipated banding today.  He reports he discussed this with the cardiologist by phone who felt this was okay.  We again discussed with recent A. fib that the stroke risk is more present while he is off Eliquis..  We also discussed that post banding hemorrhage is often easier to manage than a cardiovascular accident.  He still feels comfortable with holding Eliquis additional 5 days even with this discussion.  Should he go back into A. fib I would recommend he resume Eliquis immediately.   PROCEDURE NOTE:  The patient presents with symptomatic grade 3 internal hemorrhoids, requesting rubber band ligation of his hemorrhoidal disease.  All risks, benefits and alternative forms of therapy were described and informed consent was obtained.   The anorectum was pre-medicated with 0.125% nitroglycerin ointment The decision was made to band the LL (RA banded at visit #1) internal hemorrhoid, and the [place] was used to perform band ligation without complication.   Digital anorectal examination was then performed to assure proper positioning of the band, and to adjust the banded tissue as required.  The patient was discharged home without pain or other issues.  Dietary and behavioral recommendations were given and along with follow-up instructions.   Resume  Eliquis next Tuesday, 02/14/2020, sooner if recurrent A. fib  The patient will return as scheduled for follow-up and possible additional banding as required. No complications were encountered and the patient tolerated the procedure well.  2. GERD --recently more present heartburn and indigestion symptoms, though we discussed that the "severe  indigestion" and nausea that he was feeling while he was in RVR may have been angina rather than reflux.  He had been on famotidine with good result but has been off of this over the last several months. --Resume famotidine 40 mg each day, notify me if heartburn symptoms persist  3.  A. fib with RVR --recent episode of A. fib which has now spontaneously converted back to his normal sinus rhythm.  We discussed this at length today and I recommended that he continue to follow closely with cardiology.  I also let him know that if he goes back into RVR and is having indigestion symptoms and nausea that this could be angina and that he should seek care in the emergency room by calling EMS.  He voices understanding  We will schedule follow-up for the third hemorrhoidal band but may elect not to band if symptoms have resolved

## 2020-02-09 NOTE — Patient Instructions (Addendum)
Please restart your Eliquis on Tuesday, 02/14/20.  Restart your famotidine 40 mg daily (you already have this on hand).  You have been scheduled for your 3rd hemorrhoidal banding with Dr Hilarie Fredrickson on Wednesday 03/14/20 at 4:00 pm.  If you are age 76 or older, your body mass index should be between 23-30. Your Body mass index is 20.78 kg/m. If this is out of the aforementioned range listed, please consider follow up with your Primary Care Provider.  If you are age 24 or younger, your body mass index should be between 19-25. Your Body mass index is 20.78 kg/m. If this is out of the aformentioned range listed, please consider follow up with your Primary Care Provider.   Due to recent changes in healthcare laws, you may see the results of your imaging and laboratory studies on MyChart before your provider has had a chance to review them.  We understand that in some cases there may be results that are confusing or concerning to you. Not all laboratory results come back in the same time frame and the provider may be waiting for multiple results in order to interpret others.  Please give [place] 48 hours in order for your provider to thoroughly review all the results before contacting the office for clarification of your results.   HEMORRHOID BANDING PROCEDURE    FOLLOW-UP CARE   1. The procedure you have had should have been relatively painless since the banding of the area involved does not have nerve endings and there is no pain sensation.  The rubber band cuts off the blood supply to the hemorrhoid and the band may fall off as soon as 48 hours after the banding (the band may occasionally be seen in the toilet bowl following a bowel movement). You may notice a temporary feeling of fullness in the rectum which should respond adequately to plain Tylenol or Motrin.  2. Following the banding, avoid strenuous exercise that evening and resume full activity the next day.  A sitz bath (soaking in a warm tub) or bidet  is soothing, and can be useful for cleansing the area after bowel movements.     3. To avoid constipation, take two tablespoons of natural wheat bran, natural oat bran, flax, Benefiber or any over the counter fiber supplement and increase your water intake to 7-8 glasses daily.    4. Unless you have been prescribed anorectal medication, do not put anything inside your rectum for two weeks: No suppositories, enemas, fingers, etc.  5. Occasionally, you may have more bleeding than usual after the banding procedure.  This is often from the untreated hemorrhoids rather than the treated one.  Don't be concerned if there is a tablespoon or so of blood.  If there is more blood than this, lie flat with your bottom higher than your head and apply an ice pack to the area. If the bleeding does not stop within a half an hour or if you feel faint, call our office at [phone] or go to the emergency room.  6. Problems are not common; however, if there is a substantial amount of bleeding, severe pain, chills, fever or difficulty passing urine (very rare) or other problems, you should call us at [phone] or report to the nearest emergency room.  7. Do not stay seated continuously for more than 2-3 hours for a day or two after the procedure.  Tighten your buttock muscles 10-15 times every two hours and take 10-15 deep breaths every 1-2  hours.  Do not spend more than a few minutes on the toilet if you cannot empty your bowel; instead re-visit the toilet at a later time.

## 2020-03-14 ENCOUNTER — Ambulatory Visit: Payer: PPO | Admitting: Internal Medicine

## 2020-03-14 ENCOUNTER — Encounter: Payer: Self-pay | Admitting: Internal Medicine

## 2020-03-14 VITALS — BP 128/62 | HR 72 | Ht 71.0 in | Wt 152.0 lb

## 2020-03-14 DIAGNOSIS — K648 Other hemorrhoids: Secondary | ICD-10-CM | POA: Diagnosis not present

## 2020-03-14 DIAGNOSIS — R151 Fecal smearing: Secondary | ICD-10-CM

## 2020-03-14 MED ORDER — MESALAMINE 1.2 G PO TBEC: DELAYED_RELEASE_TABLET | ORAL | 1 refills | Status: DC

## 2020-03-14 NOTE — Patient Instructions (Signed)
We have sent the following medications to your pharmacy for you to pick up at your convenience: Lialda 2.4 mg daily  Continue Famotidine.  Please purchase the following medications over the counter and take as directed: Metamucil 1 tablespoon daily.  Restart your Eliquis on 03/20/20.  Follow up as needed.  If you are age 77 or older, your body mass index should be between 23-30. Your Body mass index is 21.2 kg/m. If this is out of the aforementioned range listed, please consider follow up with your Primary Care Provider.  Due to recent changes in healthcare laws, you may see the results of your imaging and laboratory studies on MyChart before your provider has had a chance to review them.  We understand that in some cases there may be results that are confusing or concerning to you. Not all laboratory results come back in the same time frame and the provider may be waiting for multiple results in order to interpret others.  Please give [place] 48 hours in order for your provider to thoroughly review all the results before contacting the office for clarification of your results.   HEMORRHOID BANDING PROCEDURE    FOLLOW-UP CARE   1. The procedure you have had should have been relatively painless since the banding of the area involved does not have nerve endings and there is no pain sensation.  The rubber band cuts off the blood supply to the hemorrhoid and the band may fall off as soon as 48 hours after the banding (the band may occasionally be seen in the toilet bowl following a bowel movement). You may notice a temporary feeling of fullness in the rectum which should respond adequately to plain Tylenol or Motrin.  2. Following the banding, avoid strenuous exercise that evening and resume full activity the next day.  A sitz bath (soaking in a warm tub) or bidet is soothing, and can be useful for cleansing the area after bowel movements.     3. To avoid constipation, take two tablespoons of  natural wheat bran, natural oat bran, flax, Benefiber or any over the counter fiber supplement and increase your water intake to 7-8 glasses daily.    4. Unless you have been prescribed anorectal medication, do not put anything inside your rectum for two weeks: No suppositories, enemas, fingers, etc.  5. Occasionally, you may have more bleeding than usual after the banding procedure.  This is often from the untreated hemorrhoids rather than the treated one.  Don't be concerned if there is a tablespoon or so of blood.  If there is more blood than this, lie flat with your bottom higher than your head and apply an ice pack to the area. If the bleeding does not stop within a half an hour or if you feel faint, call our office at [phone] or go to the emergency room.  6. Problems are not common; however, if there is a substantial amount of bleeding, severe pain, chills, fever or difficulty passing urine (very rare) or other problems, you should call us at [phone] or report to the nearest emergency room.  7. Do not stay seated continuously for more than 2-3 hours for a day or two after the procedure.  Tighten your buttock muscles 10-15 times every two hours and take 10-15 deep breaths every 1-2 hours.  Do not spend more than a few minutes on the toilet if you cannot empty your bowel; instead re-visit the toilet at a later time.

## 2020-03-14 NOTE — Progress Notes (Signed)
Antonio [name] is a 75 year old male with a history of ulcerative proctitis diagnosed in 2015, symptomatic bleeding and prolapsing internal hemorrhoids with fecal smearing who returns for repeat banding.  He has had hemorrhoidal banding on 12/20/2019 and 02/09/2020.  Tolerated well.  Still with some prolapse and intermittent blood with wiping.  Some fecal smearing as well  He stopped Eliquis 3 days ago.   PROCEDURE NOTE:  The patient presents with symptomatic grade 3 internal hemorrhoids, requesting rubber band ligation of his hemorrhoidal disease.  All risks, benefits and alternative forms of therapy were described and informed consent was obtained.   The anorectum was pre-medicated with 0.125% nitroglycerin ointment Rectal exam shows a grade 3 prolapsed internal hemorrhoid in the right anal canal. The decision was made to band the RP internal hemorrhoid, and the [place] was used to perform band ligation without complication.   Digital anorectal examination was then performed to assure proper positioning of the band, and to adjust the banded tissue as required.  The patient was discharged home without pain or other issues.  Dietary and behavioral recommendations were given and along with follow-up instructions.     The following adjunctive treatments were recommended: --Begin Metamucil therapy 1 to 2 tablespoons daily for fecal smearing --Resume Eliquis on 03/19/2020.;  We had previously discussed post banding hemorrhage versus a cardiovascular accident associated with holding Eliquis.  It was his preference to hold Eliquis around his banding appointment.  The patient will return as scheduled for follow-up and possible additional banding as required. No complications were encountered and the patient tolerated the procedure well.  I asked that he call me in about 3 weeks to let me know if any of his hemorrhoidal bleeding, prolapse and smearing symptoms still persist.

## 2020-03-16 DIAGNOSIS — H2513 Age-related nuclear cataract, bilateral: Secondary | ICD-10-CM | POA: Diagnosis not present

## 2020-04-25 ENCOUNTER — Telehealth: Payer: Self-pay | Admitting: Pulmonary Disease

## 2020-04-25 DIAGNOSIS — J4550 Severe persistent asthma, uncomplicated: Secondary | ICD-10-CM

## 2020-04-25 MED ORDER — DUPIXENT 300 MG/2ML ~~LOC~~ SOSY: 300.0000 mg | PREFILLED_SYRINGE | SUBCUTANEOUS | 0 refills | Status: DC

## 2020-04-25 NOTE — Telephone Encounter (Addendum)
Patient needs appointment with provider. Will send Dupixent rx for 3 months. Last seen by [name] in October with anticipated f/u of 4 months. Will route to triage team to call patient to have appointment scheduled. Rx sent for 3 months only as patient needs to be seen by provider for f/u.  Knox Saliva, PharmD, MPH Clinical Pharmacist (Rheumatology and Pulmonology)

## 2020-04-25 NOTE — Telephone Encounter (Signed)
Patient approved from PAP through [place] through 02/09/21. Just needs refills sent in to [facility].

## 2020-04-26 NOTE — Telephone Encounter (Signed)
Ok to change to [facility] clinic  Marshell Garfinkel MD [facility] 04/26/2020, 5:44 PM

## 2020-04-26 NOTE — Telephone Encounter (Signed)
Called and spoke to pt to schedule OV. Pt states he would like to change office locations to [place] as it closer to home. The first available appt with any Kyliah Deanda in [place] is with Rexene Edison, NP, on 06/05/20.    Dr. Vaughan Browner, please advise if ok to change providers in [facility] office and if ok for pt's appt to be 6 weeks out (first available in [place]). Pt was advised to follow up in 03/2020. Thanks.

## 2020-04-27 NOTE — Telephone Encounter (Signed)
Called and spoke with pt and he is aware of appt with TP on 04/26 in [place].

## 2020-06-05 ENCOUNTER — Encounter: Payer: Self-pay | Admitting: Adult Health

## 2020-06-05 ENCOUNTER — Ambulatory Visit
Admission: RE | Admit: 2020-06-05 | Discharge: 2020-06-05 | Disposition: A | Discharge status: Home / Self Care | Payer: PPO | Attending: Adult Health | Admitting: Adult Health

## 2020-06-05 ENCOUNTER — Ambulatory Visit
Admission: RE | Admit: 2020-06-05 | Discharge: 2020-06-05 | Disposition: A | Discharge status: Home / Self Care | Payer: PPO | Source: Ambulatory Visit | Attending: Adult Health | Admitting: Adult Health

## 2020-06-05 ENCOUNTER — Other Ambulatory Visit: Payer: Self-pay

## 2020-06-05 ENCOUNTER — Ambulatory Visit: Payer: PPO | Admitting: Adult Health

## 2020-06-05 DIAGNOSIS — J449 Chronic obstructive pulmonary disease, unspecified: Secondary | ICD-10-CM

## 2020-06-05 DIAGNOSIS — J300 Vasomotor rhinitis: Secondary | ICD-10-CM

## 2020-06-05 DIAGNOSIS — R0602 Shortness of breath: Secondary | ICD-10-CM | POA: Diagnosis not present

## 2020-06-05 DIAGNOSIS — J4550 Severe persistent asthma, uncomplicated: Secondary | ICD-10-CM | POA: Diagnosis not present

## 2020-06-05 MED ORDER — ALBUTEROL SULFATE (2.5 MG/3ML) 0.083% IN NEBU: 2.5000 mg | INHALATION_SOLUTION | Freq: Four times a day (QID) | RESPIRATORY_TRACT | 12 refills | Status: DC | PRN

## 2020-06-05 MED ORDER — BREZTRI AEROSPHERE 160-9-4.8 MCG/ACT IN AERO: 2.0000 | INHALATION_SPRAY | Freq: Two times a day (BID) | RESPIRATORY_TRACT | 0 refills | Status: DC

## 2020-06-05 MED ORDER — DUPIXENT 300 MG/2ML ~~LOC~~ SOSY: 300.0000 mg | PREFILLED_SYRINGE | SUBCUTANEOUS | 0 refills | Status: DC

## 2020-06-05 MED ORDER — EPINEPHRINE 0.3 MG/0.3ML IJ SOAJ: 0.3000 mg | INTRAMUSCULAR | 0 refills | Status: DC | PRN

## 2020-06-05 MED ORDER — IPRATROPIUM BROMIDE 0.02 % IN SOLN: 0.5000 mg | Freq: Four times a day (QID) | RESPIRATORY_TRACT | 12 refills | Status: DC

## 2020-06-05 MED ORDER — AZELASTINE HCL 0.1 % NA SOLN: 1.0000 | Freq: Two times a day (BID) | NASAL | 12 refills | Status: DC

## 2020-06-05 NOTE — Patient Instructions (Addendum)
Continue on BREZTRI 1-2 puffs Twice daily   Chest xray today .  Dupixent every 2 weeks  Epi Pen As needed   Activity as tolerated.  High protein diet .  Albuterol inhaler or albuterol/ipratropium neb As needed   Follow up with Dr Patsey Berthold in 4-6 months and As needed

## 2020-06-05 NOTE — Assessment & Plan Note (Signed)
Controlled on current regimen   Plan  Patient Instructions  Continue on BREZTRI 1-2 puffs Twice daily   Chest xray today .  Dupixent every 2 weeks  Epi Pen As needed   Activity as tolerated.  High protein diet .  Albuterol inhaler or albuterol/ipratropium neb As needed   Follow up with Dr Patsey Berthold in 4-6 months and As needed

## 2020-06-05 NOTE — Progress Notes (Signed)
@Patient  ID: Antonio [name], male    DOB: 09-Jan-1946, 75 y.o.   MRN: [id number]  Chief Complaint  Patient presents with  . 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 DLCO 16.26 (44% predicted)  December 2017 lung function testing showed ratio 50%, FEV1 1.47 L 43% predicted, FVC 2.96 L 67% predicted  March 2020 pulmonary function testing ratio 38%, FEV1 0.97 L 31% predicted, total lung capacity 8.44 L 119% predicted, residual volume 198% predicted, DLCO 14.0 55% predicted  March 2020 high-resolution CT scan of the chest images : Centrilobular emphysema, no interstitial lung disease, aortic atherosclerosis  04/20/2018-eosinophils relative 7.3, eosinophils absolute 0.6 04/20/2018-IgE-67  01/20/2019-SARS-CoV-2-detected   Pets: Dogs, no cats, birds, farm animals Occupation: Retired Museum/gallery conservator Exposures: No known exposures.  No mold, hot tub, Jacuzzi.  No feather pillows or comforters Smoking history: 26-pack-year smoker.  Quit in 2013 Travel history: Originally from [place].  Moved to [place] in the 1950s.  No significant recent travel Relevant family history: No significant family history of lung disease  06/05/2020 Follow up : COPD and Asthma  Patient returns for a 6-monthfollow-up.  Patient has underlying severe COPD and allergic asthma.  He has maintained on BREZTRI 2 puffs twice daily.  He is on home Dupixent injections 300 mg every 2 weeks. (On Sundays) . No known issues . Endorces Epi Pen, need refill as his will expire soon.  Uses Duoneb few times a  day, cuts back on BREZTRI 1 puff Twice daily  Due to cost.  Patient says overall breathing is doing okay with dyspnea at baseline. No flare of cough or wheezing. No ER or Hospital visits in last few years.  Remains active , does own yard work. Stopped playing golf 2 yrs ago due to dyspnea and decreased activity tolerance.  No chest pain, orthopnea or hemoptysis .      Allergies  Allergen Reactions  . Penicillins Rash    Has patient had a PCN reaction causing immediate rash, facial/tongue/throat swelling, SOB or lightheadedness with hypotension:Yes Has patient had a PCN reaction causing severe rash involving mucus membranes or skin necrosis:Yes Has patient had a PCN reaction that required hospitalization:No Has patient had a PCN reaction occurring within the last 10 years:No If all of the above answers are "NO", then may proceed with Cephalosporin use.     Immunization History  Administered Date(s) Administered  . Influenza Inj Mdck Quad Pf 10/28/2017  . Influenza Split 11/24/2013  . Influenza, High Dose Seasonal PF 11/05/2016, 10/29/2017, 10/06/2018, 11/03/2019  . Influenza,inj,Quad PF,6+ Mos 11/08/2014, 11/26/2015  . Influenza-Unspecified 10/11/2013, 10/11/2013  . PFIZER(Purple Top)SARS-COV-2 Vaccination 05/06/2019, 05/31/2019, 01/27/2020  . Pneumococcal Conjugate-13 04/18/2014  . Pneumococcal Polysaccharide-23 11/26/2015  . Tdap 02/13/2011  . Zoster 11/07/2010  . Zoster Recombinat (Shingrix) 12/01/2016, 03/02/2017    Past Medical History:  Diagnosis Date  . Allergy   . Calf pain   . COPD (chronic obstructive pulmonary disease) (HCC)    no Oxygen per pt  . COVID-19   . Dyspnea   . Hypokalemia   . Internal hemorrhoids   . Internal hemorrhoids   .  Muscle cramp   . Muscle pain   . Pneumonia   . PONV (postoperative nausea and vomiting)   . Tubular adenoma of colon   . Ulcerative colitis ([place])     Tobacco History: Social History   Tobacco Use  Smoking Status  Former Smoker  . Packs/day: 0.50  . Years: 35.00  . Pack years: 17.50  . Types: Cigarettes, Cigars  . Quit date: 06/11/2011  . Years since quitting: 8.9  Smokeless Tobacco Never Used  Tobacco Comment   .75 packs a day.    Counseling given: Not Answered Comment: .75 packs a day.    Outpatient Medications Prior to Visit  Medication Sig Dispense Refill  . Ascorbic Acid 100 MG CHEW Chew by mouth.    . Budeson-Glycopyrrol-Formoterol (BREZTRI AEROSPHERE) 160-9-4.8 MCG/ACT AERO Inhale 2 puffs into the lungs 2 (two) times daily. 5.9 g 0  . ELIQUIS 5 MG TABS tablet Take 5 mg by mouth 2 (two) times daily.    Marland Kitchen ketoconazole (NIZORAL) 2 % shampoo     . loperamide (IMODIUM) 2 MG capsule Take 2 mg by mouth daily as needed for diarrhea or loose stools.     Marland Kitchen MAGNESIUM PO Take by mouth.    . mesalamine (LIALDA) 1.2 g EC tablet TAKE 2 TABLETS BY MOUTH DAILY WITH BREAKFAST 180 tablet 1  . metoprolol succinate (TOPROL-XL) 25 MG 24 hr tablet Take 25 mg by mouth daily.    . Multiple Vitamins-Minerals (ZINC PO) Take by mouth.    Marland Kitchen VITAMIN D PO Take by mouth.    Marland Kitchen albuterol (PROVENTIL) (2.5 MG/3ML) 0.083% nebulizer solution Take 3 mLs (2.5 mg total) by nebulization every 6 (six) hours as needed for wheezing or shortness of breath. 75 mL 12  . azelastine (ASTELIN) 0.1 % nasal spray Place 1 spray into both nostrils 2 (two) times daily. Use in each nostril as directed 30 mL 12  . dupilumab (DUPIXENT) 300 MG/2ML prefilled syringe Inject 300 mg into the skin every 14 (fourteen) days. 12 mL 0  . famotidine (PEPCID) 40 MG tablet Take 1 tablet (40 mg total) by mouth daily. (Patient not taking: Reported on 06/05/2020) 90 tablet 1   No facility-administered medications prior to visit.     Review of Systems:   Constitutional:   No  weight loss, night sweats,  Fevers, chills,  +fatigue, or  lassitude.  HEENT:   No headaches,  Difficulty swallowing,  Tooth/dental problems, or  Sore throat,                No  sneezing, itching, ear ache, nasal congestion, post nasal drip,   CV:  No chest pain,  Orthopnea, PND, swelling in lower extremities, anasarca, dizziness, palpitations, syncope.   GI  No heartburn, indigestion, abdominal pain, nausea, vomiting, diarrhea, change in bowel habits, loss of appetite, bloody stools.   Resp .  No excess mucus, no productive cough,  No non-productive cough,  No coughing up of blood.  No change in color of mucus.  No wheezing.  No chest wall deformity  Skin: no rash or lesions.  GU: no dysuria, change in color of urine, no urgency or frequency.  No flank pain, no hematuria   MS:  No joint pain or swelling.  No decreased range of motion.  No back pain.    Physical Exam  BP 118/80 (BP Location: Left Arm, Patient Position: Sitting, Cuff Size: Normal)   Pulse 62   Temp (!) 97.5 F (36.4 C) (Temporal)  Ht 5' 8"  (1.727 m)   Wt 146 lb 9.6 oz (66.5 kg)   SpO2 95%   BMI 22.29 kg/m   GEN: A/Ox3; pleasant , NAD, well nourished    HEENT:  [place]/AT,   NOSE-clear, THROAT-clear, no lesions, no postnasal drip or exudate noted.   NECK:  Supple w/ fair ROM; no JVD; normal carotid impulses w/o bruits; no thyromegaly or nodules palpated; no lymphadenopathy.    RESP  Clear  P & A; w/o, wheezes/ rales/ or rhonchi. no accessory muscle use, no dullness to percussion  CARD:  RRR, no m/r/g, no peripheral edema, pulses intact, no cyanosis or clubbing.  GI:   Soft & nt; nml bowel sounds; no organomegaly or masses detected.   Musco: Warm bil, no deformities or joint swelling noted.   Neuro: alert, no focal deficits noted.    Skin: Warm, no lesions or rashes    Lab Results:   BMET  BNP No results found for: BNP  ProBNP No results found for: PROBNP  Imaging: No results found.    PFT Results Latest Ref Rng & Units 04/20/2018 06/07/2014  FVC-Pre L 2.56 2.80  FVC-Predicted Pre % 60 57  FVC-Post L 2.60 2.90  FVC-Predicted Post % 61 59  Pre FEV1/FVC % % 38 52   Post FEV1/FCV % % 39 51  FEV1-Pre L 0.97 1.46  FEV1-Predicted Pre % 31 40  FEV1-Post L 1.02 1.47  DLCO uncorrected ml/min/mmHg 14.01 16.26  DLCO UNC% % 55 44  DLVA Predicted % 68 55  TLC L 8.44 -  TLC % Predicted % 119 -  RV % Predicted % 198 -    No results found for: NITRICOXIDE      Assessment & Plan:   Centrilobular Emphysema Controlled on current regimen   Plan  Patient Instructions  Continue on BREZTRI 1-2 puffs Twice daily   Chest xray today .  Dupixent every 2 weeks  Epi Pen As needed   Activity as tolerated.  High protein diet .  Albuterol inhaler or albuterol/ipratropium neb As needed   Follow up with Dr Patsey Berthold in 4-6 months and As needed       Severe persistent asthma without complication Controlled on rx   Plan  Patient Instructions  Continue on BREZTRI 1-2 puffs Twice daily   Chest xray today .  Dupixent every 2 weeks  Epi Pen As needed   Activity as tolerated.  High protein diet .  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

## 2020-06-05 NOTE — Progress Notes (Signed)
Agree with the details of the visit as noted by Tammy Parrett, NP.  C. Laura Tarig Zimmers, MD [facility] 

## 2020-06-05 NOTE — Assessment & Plan Note (Addendum)
Controlled on rx   Plan  Patient Instructions  Continue on BREZTRI 1-2 puffs Twice daily   Chest xray today .  Dupixent every 2 weeks  Epi Pen As needed   Activity as tolerated.  High protein diet .  Albuterol inhaler or albuterol/ipratropium neb As needed   Follow up with Dr Patsey Berthold in 4-6 months and As needed    '

## 2020-06-05 NOTE — Assessment & Plan Note (Signed)
Continue on current regimen .   

## 2020-06-08 NOTE — Progress Notes (Signed)
Called and left message on voicemail to please return phone call to go over xray results. Contact number provided.

## 2020-06-08 NOTE — Progress Notes (Signed)
Patient returned phone call, name and birth date confirmed. Went over [employer] results per T. Parrett NP with patient. All questions answered and patient expressed full understanding. Nothing further needed at this time.

## 2020-06-14 ENCOUNTER — Telehealth: Payer: Self-pay | Admitting: *Deleted

## 2020-06-14 NOTE — Telephone Encounter (Signed)
Papers received for the PA for the Ipratropium bromide 0.02%.   These have been completed and placed in TP sign folder.  Will forward to [facility] to follow up on.

## 2020-06-21 NOTE — Telephone Encounter (Signed)
PA Papers on Tammy's desk in folder for signature.

## 2020-06-22 NOTE — Telephone Encounter (Signed)
Elixir (coverage determination request form) faxed to [phone].  Received message that is was sent successfully.  Nothing further needed.

## 2020-07-03 ENCOUNTER — Telehealth: Payer: Self-pay | Admitting: Adult Health

## 2020-07-03 DIAGNOSIS — J4550 Severe persistent asthma, uncomplicated: Secondary | ICD-10-CM

## 2020-07-03 MED ORDER — DUPIXENT 300 MG/2ML ~~LOC~~ SOSY: 300.0000 mg | PREFILLED_SYRINGE | SUBCUTANEOUS | 0 refills | Status: DC

## 2020-07-03 NOTE — Telephone Encounter (Signed)
Rx for Dupixent has been sent to preferred pharmacy.  Patient is aware and voiced his understanding.  Nothing further needed at this time.

## 2020-07-04 ENCOUNTER — Telehealth: Payer: Self-pay | Admitting: Internal Medicine

## 2020-07-04 NOTE — Telephone Encounter (Signed)
OK to schedule pt for banding, see note below.

## 2020-07-04 NOTE — Telephone Encounter (Signed)
See note below and advise if pt needs an office visit or can just be scheduled for another banding.

## 2020-07-04 NOTE — Telephone Encounter (Signed)
Ok for a banding slot

## 2020-07-04 NOTE — Telephone Encounter (Signed)
Patients wife called and would like to schedule another banding procedure with Dr. Hilarie Fredrickson not sure if he needs to be seen prior to scheduling or if he can get scheduled for his 4th banding. Patient is currently experiencing some rectal bleeding and said the hemorrhoids are sticking out.

## 2020-08-21 ENCOUNTER — Ambulatory Visit (INDEPENDENT_AMBULATORY_CARE_PROVIDER_SITE_OTHER): Payer: PPO | Admitting: Vascular Surgery

## 2020-08-21 ENCOUNTER — Encounter (INDEPENDENT_AMBULATORY_CARE_PROVIDER_SITE_OTHER): Payer: PPO

## 2020-08-27 ENCOUNTER — Telehealth: Payer: Self-pay | Admitting: Pharmacist

## 2020-08-27 NOTE — Progress Notes (Signed)
[facility])  [place] Team    08/27/2020  Alphonse Asbridge 08/23/1945 [id number]  Reason for referral: Medication Assistance  Referral source:  HTA Patient Assistance List from 2021 Current insurance: [employer]  Outreach:  Successful telephone call with Sheron Nightingale.  HIPAA identifiers verified.   Subjective:  Discussed patient assistance needs for [employer] and patient assistance program through [place].    Allergies  Allergen Reactions   Penicillins Rash    Has patient had a PCN reaction causing immediate rash, facial/tongue/throat swelling, SOB or lightheadedness with hypotension:Yes Has patient had a PCN reaction causing severe rash involving mucus membranes or skin necrosis:Yes Has patient had a PCN reaction that required hospitalization:No Has patient had a PCN reaction occurring within the last 10 years:No If all of the above answers are "NO", then may proceed with Cephalosporin use.     Medications Reviewed Today     Reviewed by Melvenia Needles, NP (Nurse Practitioner) on 06/05/20 at 1440  Med List Status: <None>   Medication Order Taking? Sig Documenting Provider Last Dose Status Informant  albuterol (PROVENTIL) (2.5 MG/3ML) 0.083% nebulizer solution [id number] Yes Take 3 mLs (2.5 mg total) by nebulization every 6 (six) hours as needed for wheezing or shortness of breath. [name], MD Taking Active   Ascorbic Acid 100 MG CHEW [id number] Yes Chew by mouth. [provider] Taking Active   azelastine (ASTELIN) 0.1 % nasal spray [id number] Yes Place 1 spray into both nostrils 2 (two) times daily. Use in each nostril as directed Lauraine Rinne, NP Taking Active   Budeson-Glycopyrrol-Formoterol (BREZTRI AEROSPHERE) 160-9-4.8 MCG/ACT AERO [id number] Yes Inhale 2 puffs into the lungs 2 (two) times daily. Lauraine Rinne, NP Taking Active   dupilumab (DUPIXENT) 300 MG/2ML prefilled syringe [id number] Yes Inject  300 mg into the skin every 14 (fourteen) days. Marshell Garfinkel, MD Taking Active   ELIQUIS 5 MG TABS tablet [id number] Yes Take 5 mg by mouth 2 (two) times daily. [provider] Taking Active   famotidine (PEPCID) 40 MG tablet [id number] No Take 1 tablet (40 mg total) by mouth daily.  Patient not taking: Reported on 06/05/2020   Jerene Bears, MD Not Taking Active   ketoconazole (NIZORAL) 2 % shampoo [id number] Yes  [provider] Taking Active   loperamide (IMODIUM) 2 MG capsule [id number] Yes Take 2 mg by mouth daily as needed for diarrhea or loose stools.  [provider] Taking Active Self  MAGNESIUM PO [id number] Yes Take by mouth. [provider] Taking Active   mesalamine (LIALDA) 1.2 g EC tablet [id number] Yes TAKE 2 TABLETS BY MOUTH DAILY WITH BREAKFAST Pyrtle, Lajuan Lines, MD Taking Active   metoprolol succinate (TOPROL-XL) 25 MG 24 hr tablet [id number] Yes Take 25 mg by mouth daily. [provider] Taking Active   Multiple Vitamins-Minerals (ZINC PO) [id number] Yes Take by mouth. [provider] Taking Active   VITAMIN D PO [id number] Yes Take by mouth. [provider] Taking Active             Assessment:  Medication Review Findings:  Patient may qualify for patient assistance with Marcie Bal, based on income requirements and discussion with patient he may be slightly above the income requirements and is willing to apply and is aware there is a chance that he could be denied.     Medication Assistance Findings:  Medication assistance needs identified: Breztri inhaler  Extra Help:  Not eligible for  Extra Help Low Income Subsidy based on reported income and assets  Patient Assistance Programs: Judithann Sauger made by [place] requirement met:  Patient reports that he may be slightly above income requirements, however is willing to apply to the program for a decision.  Out-of-pocket prescription expenditure met:   Not  Applicable Reviewed program requirements with patient.     Additional medication assistance options reviewed with patient as warranted:  No other options identified  Plan: I will route patient assistance letter to [place] technician who will coordinate patient assistance program application process for medications listed above.  [facility] pharmacy technician will assist with obtaining all required documents from both patient and provider(s) and submit application(s) once completed.   Loretha Brasil, PharmD [place] Clinical Pharmacist Direct Dial: [phone]

## 2020-08-28 ENCOUNTER — Other Ambulatory Visit (INDEPENDENT_AMBULATORY_CARE_PROVIDER_SITE_OTHER): Payer: Self-pay | Admitting: Vascular Surgery

## 2020-08-28 DIAGNOSIS — I739 Peripheral vascular disease, unspecified: Secondary | ICD-10-CM

## 2020-08-28 DIAGNOSIS — I6523 Occlusion and stenosis of bilateral carotid arteries: Secondary | ICD-10-CM

## 2020-08-31 ENCOUNTER — Other Ambulatory Visit: Payer: Self-pay

## 2020-08-31 ENCOUNTER — Ambulatory Visit (INDEPENDENT_AMBULATORY_CARE_PROVIDER_SITE_OTHER): Payer: PPO

## 2020-08-31 ENCOUNTER — Ambulatory Visit (INDEPENDENT_AMBULATORY_CARE_PROVIDER_SITE_OTHER): Payer: PPO | Admitting: Vascular Surgery

## 2020-08-31 VITALS — BP 130/55 | HR 66 | Ht 71.0 in | Wt 151.0 lb

## 2020-08-31 DIAGNOSIS — I70212 Atherosclerosis of native arteries of extremities with intermittent claudication, left leg: Secondary | ICD-10-CM | POA: Diagnosis not present

## 2020-08-31 DIAGNOSIS — I739 Peripheral vascular disease, unspecified: Secondary | ICD-10-CM

## 2020-08-31 DIAGNOSIS — I6523 Occlusion and stenosis of bilateral carotid arteries: Secondary | ICD-10-CM

## 2020-08-31 DIAGNOSIS — J439 Emphysema, unspecified: Secondary | ICD-10-CM | POA: Diagnosis not present

## 2020-08-31 NOTE — Progress Notes (Signed)
MRN : [id number]  Antonio [name] is a 75 y.o. (12/20/1945) male who presents with chief complaint of  Chief Complaint  Patient presents with   Follow-up     1 yr carotid  .  History of Present Illness: Patient returns today in follow up of multiple vascular issues.  He is doing well today.  He still has some claudication symptoms on that left leg but they are reasonably stable.  He denies any ulceration or rest pain.  His ABIs today are stable at 1.15 on the right and 0.74 on the left. He is also followed for his carotid disease.  No focal neurologic symptoms. Specifically, the patient denies amaurosis fugax, speech or swallowing difficulties, or arm or leg weakness or numbness. Duplex findings today are consistent with 1 to 39% ICA stenosis bilaterally with likely a slightly higher degree of stenosis in the common carotid arteries but no significant progression from his previous studies.  Current Outpatient Medications  Medication Sig Dispense Refill   albuterol (PROVENTIL) (2.5 MG/3ML) 0.083% nebulizer solution Take 3 mLs (2.5 mg total) by nebulization every 6 (six) hours as needed for wheezing or shortness of breath. 120 mL 12   Ascorbic Acid 100 MG CHEW Chew by mouth.     azelastine (ASTELIN) 0.1 % nasal spray Place 1 spray into both nostrils 2 (two) times daily. Use in each nostril as directed 30 mL 12   Budeson-Glycopyrrol-Formoterol (BREZTRI AEROSPHERE) 160-9-4.8 MCG/ACT AERO Inhale 2 puffs into the lungs 2 (two) times daily. 5.9 g 0   Budeson-Glycopyrrol-Formoterol (BREZTRI AEROSPHERE) 160-9-4.8 MCG/ACT AERO Inhale 2 puffs into the lungs in the morning and at bedtime. 5.9 g 0   dupilumab (DUPIXENT) 300 MG/2ML prefilled syringe Inject 300 mg into the skin every 14 (fourteen) days. 12 mL 0   ELIQUIS 5 MG TABS tablet Take 5 mg by mouth 2 (two) times daily.     EPINEPHrine 0.3 mg/0.3 mL IJ SOAJ injection Inject 0.3 mg into the muscle as needed for anaphylaxis. 1 each 0    famotidine (PEPCID) 40 MG tablet Take 1 tablet (40 mg total) by mouth daily. 90 tablet 1   hydrocortisone 2.5 % cream APPLY TO AFFECTED SCALY AREAS ON FACE TWICE DAILY AS NEEDED     ipratropium (ATROVENT) 0.02 % nebulizer solution Take 2.5 mLs (0.5 mg total) by nebulization 4 (four) times daily. 75 mL 12   ketoconazole (NIZORAL) 2 % shampoo      loperamide (IMODIUM) 2 MG capsule Take 2 mg by mouth daily as needed for diarrhea or loose stools.      Magnesium Gluconate (MAGNESIUM 27 PO) Take by mouth.     MAGNESIUM PO Take by mouth.     mesalamine (LIALDA) 1.2 g EC tablet TAKE 2 TABLETS BY MOUTH DAILY WITH BREAKFAST 180 tablet 1   metoprolol succinate (TOPROL-XL) 25 MG 24 hr tablet Take 25 mg by mouth daily.     Multiple Vitamins-Minerals (ZINC PO) Take by mouth.     Tiotropium Bromide Monohydrate 2.5 MCG/ACT AERS Inhale into the lungs.     VITAMIN D PO Take by mouth.     Zinc Sulfate 140 (50 Zn) MG TABS Take by mouth.     No current facility-administered medications for this visit.    Past Medical History:  Diagnosis Date   Allergy    Calf pain    COPD (chronic obstructive pulmonary disease) (HCC)    no Oxygen per pt   COVID-19    Dyspnea  Hypokalemia    Internal hemorrhoids    Internal hemorrhoids    Muscle cramp    Muscle pain    Pneumonia    PONV (postoperative nausea and vomiting)    Tubular adenoma of colon    Ulcerative colitis ([place])     Past Surgical History:  Procedure Laterality Date   ANTERIOR CERVICAL DECOMP/DISCECTOMY FUSION N/A 05/05/2016   Procedure: ANTERIOR CERVICAL DECOMPRESSION FUSION CERVICAL THREE-FOUR.;  Surgeon: Earnie Larsson, MD;  Location: [facility];  Service: Neurosurgery;  Laterality: N/A;  right side approach   CERVICAL DISCECTOMY  04/2016   C3-C4   COLONOSCOPY     COLONOSCOPY  08/09/2015   HEMORRHOID SURGERY  1988   leg stent Left approx 5-6 yrs ago   2 to left leg   MENISCUS REPAIR Left 12/2013   POLYPECTOMY     ROTATOR CUFF REPAIR Left 2007    TONSILLECTOMY AND ADENOIDECTOMY  1957     Social History   Tobacco Use   Smoking status: Former    Packs/day: 0.50    Years: 35.00    Pack years: 17.50    Types: Cigarettes, Cigars    Quit date: 06/11/2011    Years since quitting: 9.2   Smokeless tobacco: Never   Tobacco comments:    .75 packs a day.   Vaping Use   Vaping Use: Former  Substance Use Topics   Alcohol use: Yes    Alcohol/week: 7.0 standard drinks    Types: 7 Standard drinks or equivalent per week    Comment: 1-2 day    Drug use: No       Family History  Problem Relation Age of Onset   Cancer Mother        leukemia   Colon polyps Mother    Heart disease Father    Heart attack Father    Healthy Daughter    Healthy Son    Colon cancer Neg Hx    Esophageal cancer Neg Hx    Rectal cancer Neg Hx    Stomach cancer Neg Hx      Allergies  Allergen Reactions   Penicillins Rash    Has patient had a PCN reaction causing immediate rash, facial/tongue/throat swelling, SOB or lightheadedness with hypotension:Yes Has patient had a PCN reaction causing severe rash involving mucus membranes or skin necrosis:Yes Has patient had a PCN reaction that required hospitalization:No Has patient had a PCN reaction occurring within the last 10 years:No If all of the above answers are "NO", then may proceed with Cephalosporin use.     REVIEW OF SYSTEMS (Negative unless checked)   Constitutional: [] Weight loss  [] Fever  [] Chills Cardiac: [] Chest pain   [] Chest pressure   [] Palpitations   [] Shortness of breath when laying flat   [] Shortness of breath at rest   [] Shortness of breath with exertion. Vascular:  [x] Pain in legs with walking   [] Pain in legs at rest   [] Pain in legs when laying flat   [x] Claudication   [] Pain in feet when walking  [] Pain in feet at rest  [] Pain in feet when laying flat   [] History of DVT   [] Phlebitis   [] Swelling in legs   [] Varicose veins   [] Non-healing ulcers Pulmonary:   [] Uses home oxygen    [] Productive cough   [] Hemoptysis   [] Wheeze  [] COPD   [] Asthma Neurologic:  [] Dizziness  [] Blackouts   [] Seizures   [] History of stroke   [] History of TIA  [] Aphasia   [] Temporary blindness   []   Dysphagia   [x] Weakness or numbness in arms   [] Weakness or numbness in legs Musculoskeletal:  [] Arthritis   [] Joint swelling   [] Joint pain   [] Low back pain Hematologic:  [] Easy bruising  [] Easy bleeding   [] Hypercoagulable state   [] Anemic   Gastrointestinal:  [] Blood in stool   [] Vomiting blood  [] Gastroesophageal reflux/heartburn   [] Abdominal pain Genitourinary:  [] Chronic kidney disease   [] Difficult urination  [] Frequent urination  [] Burning with urination   [] Hematuria Skin:  [] Rashes   [] Ulcers   [] Wounds Psychological:  [] History of anxiety   []  History of major depression.  Physical Examination  BP (!) 130/55   Pulse 66   Ht 5' 11"  (1.803 m)   Wt 151 lb (68.5 kg)   BMI 21.06 kg/m  Gen:  WD/WN, NAD Head: [place]/AT, No temporalis wasting. Ear/Nose/Throat: Hearing grossly intact, nares w/o erythema or drainage Eyes: Conjunctiva clear. Sclera non-icteric Neck: Supple.  Trachea midline Pulmonary:  Good air movement, no use of accessory muscles.  Cardiac: RRR, no JVD Vascular:  Vessel Right Left  Radial Palpable Palpable                          PT 2+ Palpable 1+ Palpable  DP 2+ Palpable 1+ Palpable   Gastrointestinal: soft, non-tender/non-distended. No guarding/reflex.  Musculoskeletal: M/S 5/5 throughout.  No deformity or atrophy. Trace BLE edema. Neurologic: Sensation grossly intact in extremities.  Symmetrical.  Speech is fluent.  Psychiatric: Judgment intact, Mood & affect appropriate for pt's clinical situation. Dermatologic: No rashes or ulcers noted.  No cellulitis or open wounds.      Labs No results found for this or any previous visit (from the past 2160 hour(s)).  Radiology No results found.  Assessment/Plan Chronic obstructive airway disease (HCC) Fairly  severe.  Has started a walking regimen so this has made his claudication more noticeable.  Atherosclerosis of native arteries of extremity with intermittent claudication (HCC) His ABIs today are stable at 1.15 on the right and 0.74 on the left.  No limb threatening symptoms.  Continue walking and normal exercise regimen.  Recheck in 1 year.  No change in medical regimen.  Carotid stenosis Duplex findings today are consistent with 1 to 39% ICA stenosis bilaterally with likely a slightly higher degree of stenosis in the common carotid arteries but no significant progression from his previous studies.  No focal symptoms of cerebrovascular ischemia.  Plan to recheck in 1 year.    Leotis Pain, MD  08/31/2020 12:27 PM    This note was created with Dragon medical transcription system.  Any errors from dictation are purely unintentional

## 2020-08-31 NOTE — Assessment & Plan Note (Signed)
Duplex findings today are consistent with 1 to 39% ICA stenosis bilaterally with likely a slightly higher degree of stenosis in the common carotid arteries but no significant progression from his previous studies.  No focal symptoms of cerebrovascular ischemia.  Plan to recheck in 1 year.

## 2020-08-31 NOTE — Assessment & Plan Note (Signed)
His ABIs today are stable at 1.15 on the right and 0.74 on the left.  No limb threatening symptoms.  Continue walking and normal exercise regimen.  Recheck in 1 year.  No change in medical regimen.

## 2020-09-04 ENCOUNTER — Telehealth: Payer: Self-pay | Admitting: Pharmacy Technician

## 2020-09-04 DIAGNOSIS — Z596 Low income: Secondary | ICD-10-CM

## 2020-09-04 NOTE — Progress Notes (Signed)
[facility])                                            [place] Team    09/04/2020  Antonio [name] Jul 23, 1945 [id number]                                       Medication Assistance Referral  Referral From: [place]  Medication/Company: Judithann Sauger / [place] Patient application portion:  Mailed Provider application portion: Faxed  to Rexene Edison, NP at [facility] Pulmonary at [facility] Provider address/fax verified via: [employer]. Antonio [name], [place]  [phone]

## 2020-09-05 ENCOUNTER — Telehealth: Payer: Self-pay | Admitting: Internal Medicine

## 2020-09-05 NOTE — Telephone Encounter (Signed)
Patient is trying to change his care to having all [place] providers. He is asking to be referred to Dr Derrel Nip from his current PCP as he says Dr Derrel Nip was highly recommended. I advised that he should not need a referral, but rather should contact Dr Demetrios Isaacs office and simply ask to transfer care to her. However, patient states he called previously and was told Dr Derrel Nip does not accept new patients. He would like to know if there is any way Dr Hilarie Fredrickson could get him in with Dr Derrel Nip specifically.

## 2020-09-05 NOTE — Telephone Encounter (Signed)
Hi Helene Kelp This patient well known to me for several years has requested you as PCP.  Can/are you accepting new patients Thanks and hope you are well Ulice Dash

## 2020-09-05 NOTE — Telephone Encounter (Signed)
Patient called is requesting a referral to see a [place] primary care\cardiologist providers.

## 2020-09-06 NOTE — Telephone Encounter (Signed)
Dottie Please let patient know that I reached out to Dr. Derrel Nip who is not excepting new patients.  Sorry I was not able to help.  JMP

## 2020-09-06 NOTE — Telephone Encounter (Signed)
Not sure which providers at [facility] are accepting new patients.

## 2020-09-06 NOTE — Telephone Encounter (Signed)
Any other recommendations for PCP in [place]?

## 2020-09-06 NOTE — Telephone Encounter (Signed)
I have spoken to patient to advise that although we reached out to Dr Derrel Nip, she unfortunately cannot see him. I advised that he may want to call [facility] in [place] and ask about switching care to another physician there that is accepting patients [facility] office is closer in proximity to his home). He verbalizes understanding.

## 2020-09-25 ENCOUNTER — Ambulatory Visit (INDEPENDENT_AMBULATORY_CARE_PROVIDER_SITE_OTHER): Payer: PPO | Admitting: Nurse Practitioner

## 2020-10-01 ENCOUNTER — Encounter: Payer: Self-pay | Admitting: Internal Medicine

## 2020-10-01 ENCOUNTER — Ambulatory Visit: Payer: PPO | Admitting: Internal Medicine

## 2020-10-01 VITALS — BP 120/68 | HR 80 | Ht 69.0 in | Wt 148.4 lb

## 2020-10-01 DIAGNOSIS — K625 Hemorrhage of anus and rectum: Secondary | ICD-10-CM

## 2020-10-01 DIAGNOSIS — K648 Other hemorrhoids: Secondary | ICD-10-CM | POA: Diagnosis not present

## 2020-10-01 DIAGNOSIS — K5900 Constipation, unspecified: Secondary | ICD-10-CM

## 2020-10-01 DIAGNOSIS — I4891 Unspecified atrial fibrillation: Secondary | ICD-10-CM | POA: Diagnosis not present

## 2020-10-01 DIAGNOSIS — K5190 Ulcerative colitis, unspecified, without complications: Secondary | ICD-10-CM

## 2020-10-01 MED ORDER — MESALAMINE 1.2 G PO TBEC: DELAYED_RELEASE_TABLET | ORAL | 1 refills | Status: DC

## 2020-10-01 NOTE — Patient Instructions (Signed)
Discontinue Imodium.  Continue Colace.  We have sent the following medications to your pharmacy for you to pick up at your convenience: Lialda 4 tablets daily  If you continue to have hard stool despite stopping imodium, please start Miralax 1 capful (17 g) dissolved in at least 8 ounces water/juice daily (over the counter medication)  We have sent a referral to Dr Rowland Lathe for A Fib. If you do not hear from their office within the next 2 weeks, let us know.  If you are age 64 or older, your body mass index should be between 23-30. Your Body mass index is 21.91 kg/m. If this is out of the aforementioned range listed, please consider follow up with your Primary Care Provider.  If you are age 73 or younger, your body mass index should be between 19-25. Your Body mass index is 21.91 kg/m. If this is out of the aformentioned range listed, please consider follow up with your Primary Care Provider.   __________________________________________________________  The [facility] providers would like to encourage you to use [facility] to communicate with providers for non-urgent requests or questions.  Due to long hold times on the telephone, sending your provider a message by [facility] may be a faster and more efficient way to get a response.  Please allow 48 business hours for a response.  Please remember that this is for non-urgent requests.   Due to recent changes in healthcare laws, you may see the results of your imaging and laboratory studies on MyChart before your provider has had a chance to review them.  We understand that in some cases there may be results that are confusing or concerning to you. Not all laboratory results come back in the same time frame and the provider may be waiting for multiple results in order to interpret others.  Please give [place] 48 hours in order for your provider to thoroughly review all the results before contacting the office for clarification of your results.    HEMORRHOID BANDING PROCEDURE    FOLLOW-UP CARE   The procedure you have had should have been relatively painless since the banding of the area involved does not have nerve endings and there is no pain sensation.  The rubber band cuts off the blood supply to the hemorrhoid and the band may fall off as soon as 48 hours after the banding (the band may occasionally be seen in the toilet bowl following a bowel movement). You may notice a temporary feeling of fullness in the rectum which should respond adequately to plain Tylenol or Motrin.  Following the banding, avoid strenuous exercise that evening and resume full activity the next day.  A sitz bath (soaking in a warm tub) or bidet is soothing, and can be useful for cleansing the area after bowel movements.     To avoid constipation, take two tablespoons of natural wheat bran, natural oat bran, flax, Benefiber or any over the counter fiber supplement and increase your water intake to 7-8 glasses daily.    Unless you have been prescribed anorectal medication, do not put anything inside your rectum for two weeks: No suppositories, enemas, fingers, etc.  Occasionally, you may have more bleeding than usual after the banding procedure.  This is often from the untreated hemorrhoids rather than the treated one.  Don't be concerned if there is a tablespoon or so of blood.  If there is more blood than this, lie flat with your bottom higher than your head and apply an  ice pack to the area. If the bleeding does not stop within a half an hour or if you feel faint, call our office at [phone] or go to the emergency room.  Problems are not common; however, if there is a substantial amount of bleeding, severe pain, chills, fever or difficulty passing urine (very rare) or other problems, you should call us at [phone] or report to the nearest emergency room.  Do not stay seated continuously for more than 2-3 hours for a day or two after the  procedure.  Tighten your buttock muscles 10-15 times every two hours and take 10-15 deep breaths every 1-2 hours.  Do not spend more than a few minutes on the toilet if you cannot empty your bowel; instead re-visit the toilet at a later time.

## 2020-10-01 NOTE — Progress Notes (Signed)
Subjective:    Patient ID: Antonio [name], male    DOB: Feb 06, 1946, 75 y.o.   MRN: [id number]  HPI Antonio [name] is a 75 year old male with a history of ulcerative proctitis/left-sided colitis diagnosed in 2015, symptomatic bleeding internal hemorrhoids with fecal smearing, history of colon polyps, GERD with dyspepsia, COPD, A. fib on Eliquis who is here for follow-up.  He is here alone today.  He underwent hemorrhoidal banding for symptomatic bleeding internal hemorrhoids in November and December 2021 and last in February 2022.  He reports that hemorrhoidal banding worked significantly well for him but he had developed constipation within the last 2 weeks.  He tried 1 Colace per day and several days of MiraLAX.  After 3 days with no bowel movement he had a very hard large stool and since then has had return of prolapsing hemorrhoidal tissue and rectal bleeding.  No other abdominal pain.  He has continued Lialda 2.4 g daily.  He has been using Imodium 2 mg every morning to try to prevent fecal smearing which can occur.  He is using this along with the Colace.  He takes 100 mg Colace daily.  He is not having diarrhea.  He asked for cardiology referral for A. fib follow-up.   Review of Systems As per HPI, otherwise negative  Current Medications, Allergies, Past Medical History, Past Surgical History, Family History and Social History were reviewed in [employer] record.    Objective:   Physical Exam BP 120/68 (BP Location: Left Arm, Patient Position: Sitting, Cuff Size: Normal)   Pulse 80   Ht 5' 9"  (1.753 m)   Wt 148 lb 6 oz (67.3 kg)   BMI 21.91 kg/m  Gen: awake, alert, NAD HEENT: anicteric  CV: RRR, no mrg Pulm: CTA though distant breath sounds b/l Abd: soft, NT/ND, +BS throughout ANOSCOPY: Using a disposable, lubricated, slotted, self-illuminating anoscope, the rectum was intubated without difficulty. The trochar was removed and the ano-rectum was  circumferentially inspected. There were internal hemorrhoids, RA/RP. There was no finding of an anorectal fissure. The rectal mucosa was not inflamed. No neoplasia or other pathology was identified. The inspection was well tolerated.  Ext: no c/c/e Neuro: nonfocal      Assessment & Plan:  75 year old male with a history of ulcerative proctitis/left-sided colitis diagnosed in 2015, symptomatic bleeding internal hemorrhoids with fecal smearing, history of colon polyps, GERD with dyspepsia, COPD, A. fib on Eliquis who is here for follow-up.   Recurrent prolapse internal hemorrhoid --exacerbation constipation, see #2.  PROCEDURE NOTE:  The patient presents with symptomatic grade 2 internal hemorrhoids, requesting rubber band ligation of his hemorrhoidal disease.  All risks, benefits and alternative forms of therapy were described and informed consent was obtained.  Eliquis was not interrupted for this banding.  We did discuss the rare but possible risk of bleeding particularly in the setting of anticoagulation.  He knows to call me if he has moderate to large volume rectal bleeding.   The anorectum was pre-medicated with 0.125% nitroglycerin ointment The decision was made to band the RA internal hemorrhoid, and the Bessie was used to perform band ligation without complication.   Digital anorectal examination was then performed to assure proper positioning of the band, and to adjust the banded tissue as required.  The patient was discharged home without pain or other issues.  Dietary and behavioral recommendations were given and along with follow-up instructions.     The patient will return as scheduled  for follow-up and possible additional banding as required. No complications were encountered and the patient tolerated the procedure well.  2.  Constipation --he had started Imodium to avoid fecal smearing and urgent stools.  This Imodium is exacerbating his hemorrhoidal disease.  He  is also counteracting the loperamide with docusate. --Stop loperamide --Continue Colace 100 mg daily --If constipation persists after stopping Imodium then begin MiraLAX 17 g daily  3.  Left-sided colitis/proctitis --continue Lialda 2.4 g daily; prescription written for 4.8 g daily as at times he does need 4 tablets daily for symptoms  4.  Atrial fibrillation --referral to see Dr. Aundra Dubin at [facility]

## 2020-10-03 ENCOUNTER — Telehealth: Payer: Self-pay | Admitting: Internal Medicine

## 2020-10-03 MED ORDER — MESALAMINE 1.2 G PO TBEC: DELAYED_RELEASE_TABLET | ORAL | 0 refills | Status: AC

## 2020-10-03 NOTE — Telephone Encounter (Signed)
Patient states he has taken the printed prescription of Lialda to several pharmacies and the prescription is still over $500 with his insurance. Patient states he found out a way to get his medication much cheaper but it is through a company in [place]. Patient states he already sent the prescription but he will not receive the medication for several weeks. Patient states he will out of medication by then. Patient requests we send a 30 day supply of Lialda to [facility] for just one month. Prescription sent to [facility] per patient's request.

## 2020-10-05 ENCOUNTER — Telehealth: Payer: Self-pay | Admitting: Pulmonary Disease

## 2020-10-05 DIAGNOSIS — J4550 Severe persistent asthma, uncomplicated: Secondary | ICD-10-CM

## 2020-10-05 MED ORDER — DUPIXENT 300 MG/2ML ~~LOC~~ SOSY
300.0000 mg | PREFILLED_SYRINGE | SUBCUTANEOUS | 1 refills | Status: DC
Start: 2020-10-05 — End: 2021-02-21

## 2020-10-05 NOTE — Telephone Encounter (Signed)
Dupixent has been sent to preferred pharmacy. Patient is aware and voiced his understanding. Nothing further needed at this time.

## 2020-10-08 ENCOUNTER — Ambulatory Visit: Payer: PPO | Admitting: Pulmonary Disease

## 2020-10-16 ENCOUNTER — Telehealth (HOSPITAL_COMMUNITY): Payer: Self-pay | Admitting: Cardiology

## 2020-10-16 NOTE — Telephone Encounter (Signed)
Pt called to f/u w/referral, please advise

## 2020-10-19 ENCOUNTER — Telehealth: Payer: Self-pay | Admitting: Pulmonary Disease

## 2020-10-19 NOTE — Telephone Encounter (Signed)
Spoke to [place] with [employer], who stated that she received a Rx for Dupixent on 09/26/2020, however Rx was missing date. Upon further research, Arby Barrette was able to locate a Rx that was sent in on 10/05/2020. Nothing further is needed per [facility]  Will close encounter.

## 2020-11-01 ENCOUNTER — Encounter: Payer: Self-pay | Admitting: Adult Health

## 2020-11-01 ENCOUNTER — Ambulatory Visit: Payer: PPO | Admitting: Adult Health

## 2020-11-01 ENCOUNTER — Other Ambulatory Visit: Payer: Self-pay

## 2020-11-01 VITALS — BP 132/64 | HR 73 | Temp 97.1°F | Ht 70.0 in | Wt 148.2 lb

## 2020-11-01 DIAGNOSIS — J439 Emphysema, unspecified: Secondary | ICD-10-CM

## 2020-11-01 DIAGNOSIS — J4550 Severe persistent asthma, uncomplicated: Secondary | ICD-10-CM

## 2020-11-01 DIAGNOSIS — Z23 Encounter for immunization: Secondary | ICD-10-CM | POA: Diagnosis not present

## 2020-11-01 MED ORDER — BREZTRI AEROSPHERE 160-9-4.8 MCG/ACT IN AERO: 2.0000 | INHALATION_SPRAY | Freq: Two times a day (BID) | RESPIRATORY_TRACT | 0 refills | Status: DC

## 2020-11-01 NOTE — Assessment & Plan Note (Signed)
Under good control on present regimen.  Patient is to continue on [place]. Patient does question if he continues to need Dupixent.  Previous eosinophils were elevated.  He has had no prednisone over the last 2 years and starting Dupixent and wheezing has resolved.  Plan Patient Instructions  Continue on BREZTRI 1-2 puffs Twice daily , rinse after use.  Continue on Dupixent every 2 weeks  Albuterol inhaler As needed   Activity as tolerated.  High protein diet .  Albuterol inhaler or albuterol/ipratropium neb As needed   Follow up with Dr Patsey Berthold in 4  months and As needed

## 2020-11-01 NOTE — Progress Notes (Signed)
Agree with the details of the visit as noted by Tammy Parrett, NP.  C. Laura Maeley Matton, MD [facility] 

## 2020-11-01 NOTE — Progress Notes (Signed)
@Patient  ID: Antonio [name], male    DOB: 10-06-1945, 75 y.o.   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 DLCO 16.26 (44% predicted)   December 2017 lung function testing showed ratio 50%, FEV1 1.47 L 43% predicted, FVC 2.96 L 67% predicted   March 2020 pulmonary function testing ratio 38%, FEV1 0.97 L 31% predicted, total lung capacity 8.44 L 119% predicted, residual volume 198% predicted, DLCO 14.0 55% predicted   March 2020 high-resolution CT scan of the chest images : Centrilobular emphysema, no interstitial lung disease, aortic atherosclerosis   04/20/2018-eosinophils relative 7.3, eosinophils absolute 0.6 04/20/2018-IgE-67   01/20/2019-SARS-CoV-2-detected  Pets: Dogs, no cats, birds, farm animals Occupation: Retired Museum/gallery conservator Exposures: No known exposures.  No mold, hot tub, Jacuzzi.  No feather pillows or comforters Smoking history: 26-pack-year smoker.  Quit in 2013 Travel history: Originally from [place].  Moved to [place] in the 1950s.  No significant recent travel Relevant family history: No significant family history of lung disease  11/01/2020 Follow up : COPD with emphysema and asthma Patient returns for a 24-monthfollow-up.  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  He is maintained on Breztri inhaler 2 puffs twice daily.  He is on home Dupixent injections 300 mg every 2 weeks.  He has no known issues.  No prednisone use in the  last 2 years.  Patient does feel that DCoral [place] helped with his wheezing.  He no longer has wheezing. He uses DuoNeb during the daytime as well.  He is frustrated that he cannot do as much as he used to do. Patient says overall breathing is doing okay.  Does get short of breath with activities.  No flare of his cough or wheezing.  He does remain active.  He does some light yard work.  Is still independent and drives.  He would like his flu shot today.  Chest x-ray April 2022 showed chronic COPD changes.  He denies any chest pain orthopnea PND or increased leg swelling. He does state that his medications are expensive.  He is looking into alternatives of ways to afford medicines with patient assistance.      Allergies  Allergen Reactions   Penicillins Rash    Has patient had a PCN reaction causing immediate rash, facial/tongue/throat swelling, SOB or lightheadedness with hypotension:Yes Has patient had a PCN reaction causing severe rash involving mucus membranes or skin necrosis:Yes Has patient had a PCN reaction that required hospitalization:No Has patient had a PCN reaction occurring within the last 10 years:No If all of the above answers are "NO", then may proceed with Cephalosporin use.     Immunization History  Administered Date(s) Administered   Fluad Quad(high Dose 65+) 11/01/2020   Influenza Inj Mdck Quad Pf 10/28/2017   Influenza Split 11/24/2013   Influenza, High Dose Seasonal PF 11/05/2016, 10/29/2017, 10/06/2018, 11/03/2019   Influenza,inj,Quad PF,6+ Mos 11/08/2014, 11/26/2015   Influenza-Unspecified 10/11/2013, 10/11/2013   PFIZER(Purple Top)SARS-COV-2 Vaccination 05/06/2019, 05/31/2019, 01/27/2020   Pneumococcal Conjugate-13 04/18/2014   Pneumococcal  Polysaccharide-23 11/26/2015   Tdap 02/13/2011   Zoster Recombinat (Shingrix) 12/01/2016, 03/02/2017   Zoster, Live 11/07/2010    Past Medical History:  Diagnosis Date   Allergy    Calf pain    COPD (chronic  obstructive pulmonary disease) (HCC)    no Oxygen per pt   COVID-19    Dyspnea    Hypokalemia    Internal hemorrhoids    Internal hemorrhoids    Muscle cramp    Muscle pain    Pneumonia    PONV (postoperative nausea and vomiting)    Tubular adenoma of colon    Ulcerative colitis ([place])     Tobacco History: Social History   Tobacco Use  Smoking Status Former   Packs/day: 0.50   Years: 35.00   Pack years: 17.50   Types: Cigarettes, Cigars   Quit date: 06/11/2011   Years since quitting: 9.4  Smokeless Tobacco Never  Tobacco Comments   .75 packs a day.    Counseling given: Not Answered Tobacco comments: .75 packs a day.    Outpatient Medications Prior to Visit  Medication Sig Dispense Refill   albuterol (PROVENTIL) (2.5 MG/3ML) 0.083% nebulizer solution Take 3 mLs (2.5 mg total) by nebulization every 6 (six) hours as needed for wheezing or shortness of breath. 120 mL 12   Ascorbic Acid 100 MG CHEW Chew by mouth.     azelastine (ASTELIN) 0.1 % nasal spray Place 1 spray into both nostrils 2 (two) times daily. Use in each nostril as directed 30 mL 12   Budeson-Glycopyrrol-Formoterol (BREZTRI AEROSPHERE) 160-9-4.8 MCG/ACT AERO Inhale 2 puffs into the lungs in the morning and at bedtime. 5.9 g 0   dupilumab (DUPIXENT) 300 MG/2ML prefilled syringe Inject 300 mg into the skin every 14 (fourteen) days. 12 mL 1   ELIQUIS 5 MG TABS tablet Take 5 mg by mouth 2 (two) times daily.     EPINEPHrine 0.3 mg/0.3 mL IJ SOAJ injection Inject 0.3 mg into the muscle as needed for anaphylaxis. 1 each 0   famotidine (PEPCID) 40 MG tablet Take 1 tablet (40 mg total) by mouth daily. 90 tablet 1   hydrocortisone 2.5 % cream APPLY TO AFFECTED SCALY AREAS ON FACE TWICE DAILY AS NEEDED     ipratropium (ATROVENT) 0.02 % nebulizer solution Take 2.5 mLs (0.5 mg total) by nebulization 4 (four) times daily. 75 mL 12   ketoconazole (NIZORAL) 2 % shampoo      Magnesium Gluconate (MAGNESIUM 27 PO) Take by mouth.      MAGNESIUM PO Take by mouth.     mesalamine (LIALDA) 1.2 g EC tablet TAKE 4 TABLETS BY MOUTH DAILY WITH BREAKFAST 120 tablet 0   metoprolol succinate (TOPROL-XL) 25 MG 24 hr tablet Take 25 mg by mouth daily.     Multiple Vitamins-Minerals (ZINC PO) Take by mouth.     Tiotropium Bromide Monohydrate 2.5 MCG/ACT AERS Inhale into the lungs.     VITAMIN D PO Take by mouth.     Budeson-Glycopyrrol-Formoterol (BREZTRI AEROSPHERE) 160-9-4.8 MCG/ACT AERO Inhale 2 puffs into the lungs 2 (two) times daily. 5.9 g 0   No facility-administered medications prior to visit.     Review of Systems:   Constitutional:   No  weight loss, night sweats,  Fevers, chills,  +fatigue, or  lassitude.  HEENT:   No headaches,  Difficulty swallowing,  Tooth/dental problems, or  Sore throat,                No sneezing,  itching, ear ache,  +nasal congestion, post nasal drip,   CV:  No chest pain,  Orthopnea, PND, swelling in lower extremities, anasarca, dizziness, palpitations, syncope.   GI  No heartburn, indigestion, abdominal pain, nausea, vomiting, diarrhea, change in bowel habits, loss of appetite, bloody stools.   Resp:   No chest wall deformity  Skin: no rash or lesions.  GU: no dysuria, change in color of urine, no urgency or frequency.  No flank pain, no hematuria   MS:  No joint pain or swelling.  No decreased range of motion.  No back pain.    Physical Exam  BP 132/64 (BP Location: Right Arm, Patient Position: Sitting, Cuff Size: Normal)   Pulse 73   Temp (!) 97.1 F (36.2 C) (Temporal)   Ht 5' 10"  (1.778 m)   Wt 148 lb 3.2 oz (67.2 kg)   SpO2 94%   BMI 21.26 kg/m   GEN: A/Ox3; pleasant , NAD, elderly , thin    HEENT:  [place]/AT,  NOSE-clear, THROAT-clear, no lesions, no postnasal drip or exudate noted.   NECK:  Supple w/ fair ROM; no JVD; normal carotid impulses w/o bruits; no thyromegaly or nodules palpated; no lymphadenopathy.    RESP  Clear  P & A; w/o, wheezes/ rales/ or rhonchi. no  accessory muscle use, no dullness to percussion  CARD:  RRR, no m/r/g, no peripheral edema, pulses intact, no cyanosis or clubbing.  GI:   Soft & nt; nml bowel sounds; no organomegaly or masses detected.   Musco: Warm bil, no deformities or joint swelling noted.   Neuro: alert, no focal deficits noted.    Skin: Warm, no lesions or rashes    Lab Results:     BNP No results found for: BNP  ProBNP No results found for: PROBNP  Imaging: No results found.    PFT Results Latest Ref Rng & Units 04/20/2018 06/07/2014  FVC-Pre L 2.56 2.80  FVC-Predicted Pre % 60 57  FVC-Post L 2.60 2.90  FVC-Predicted Post % 61 59  Pre FEV1/FVC % % 38 52  Post FEV1/FCV % % 39 51  FEV1-Pre L 0.97 1.46  FEV1-Predicted Pre % 31 40  FEV1-Post L 1.02 1.47  DLCO uncorrected ml/min/mmHg 14.01 16.26  DLCO UNC% % 55 44  DLVA Predicted % 68 55  TLC L 8.44 -  TLC % Predicted % 119 -  RV % Predicted % 198 -    No results found for: NITRICOXIDE      Assessment & Plan:   Chronic obstructive airway disease (HCC) Severe COPD with emphysema and asthma.  Currently well controlled on present regimen.  Patient is encouraged on activity as tolerated. Flu shot today.  Plan  Patient Instructions  Continue on BREZTRI 1-2 puffs Twice daily , rinse after use.  Continue on Dupixent every 2 weeks  Albuterol inhaler As needed   Activity as tolerated.  High protein diet .  Albuterol inhaler or albuterol/ipratropium neb As needed   Follow up with Dr Patsey Berthold in 4  months and As needed       Severe persistent asthma without complication Under good control on present regimen.  Patient is to continue on [place]. Patient does question if he continues to need Dupixent.  Previous eosinophils were elevated.  He has had no prednisone over the last 2 years and starting Dupixent and wheezing has resolved.  Plan Patient Instructions  Continue on BREZTRI 1-2 puffs Twice daily , rinse after use.  Continue on  Dupixent every 2 weeks  Albuterol inhaler As needed   Activity as tolerated.  High protein diet .  Albuterol inhaler or albuterol/ipratropium neb As needed   Follow up with Dr Patsey Berthold in 4  months and As needed         Rexene Edison, NP 11/01/2020

## 2020-11-01 NOTE — Assessment & Plan Note (Signed)
Severe COPD with emphysema and asthma.  Currently well controlled on present regimen.  Patient is encouraged on activity as tolerated. Flu shot today.  Plan  Patient Instructions  Continue on BREZTRI 1-2 puffs Twice daily , rinse after use.  Continue on Dupixent every 2 weeks  Albuterol inhaler As needed   Activity as tolerated.  High protein diet .  Albuterol inhaler or albuterol/ipratropium neb As needed   Follow up with Dr Patsey Berthold in 4  months and As needed

## 2020-11-01 NOTE — Patient Instructions (Addendum)
Continue on BREZTRI 1-2 puffs Twice daily , rinse after use.  Continue on Dupixent every 2 weeks  Albuterol inhaler As needed   Activity as tolerated.  High protein diet .  Albuterol inhaler or albuterol/ipratropium neb As needed   Follow up with Dr Patsey Berthold in 4  months and As needed

## 2020-11-12 ENCOUNTER — Telehealth: Payer: Self-pay | Admitting: Adult Health

## 2020-11-12 MED ORDER — DOXYCYCLINE HYCLATE 100 MG PO TABS: 100.0000 mg | ORAL_TABLET | Freq: Two times a day (BID) | ORAL | 0 refills | Status: DC

## 2020-11-12 MED ORDER — PREDNISONE 10 MG (21) PO TBPK: ORAL_TABLET | Freq: Every day | ORAL | 0 refills | Status: DC

## 2020-11-12 NOTE — Telephone Encounter (Signed)
Called and spoke with patient. He verbalized understanding of recommendations. RXs have been sent to his pharmacy.   Nothing further needed at time of call.

## 2020-11-12 NOTE — Telephone Encounter (Signed)
Recommend prednisone 10 mg taper pack, 21 tablets take as directed on package.  Doxycycline 100 mg twice daily x7 days.

## 2020-11-12 NOTE — Telephone Encounter (Signed)
Called and spoke with patient. He stated that he developed a severe cold about a week ago. Now he is left with a severely congested head and chest. He has noticed that his nasal discharge has stayed green for the past week as well as the phlegm he is coughing up. He denied any fevers or body aches.   He took an at home covid test and it was negative.   He saw TP on 11/01/20.   Pharmacy is [facility] in [place].   Dr. Duwayne Heck, can you please advise? Thanks.

## 2020-11-20 DIAGNOSIS — I480 Paroxysmal atrial fibrillation: Secondary | ICD-10-CM | POA: Diagnosis not present

## 2020-11-22 DIAGNOSIS — I480 Paroxysmal atrial fibrillation: Secondary | ICD-10-CM | POA: Diagnosis not present

## 2020-11-23 ENCOUNTER — Ambulatory Visit
Admission: EM | Admit: 2020-11-23 | Discharge: 2020-11-23 | Disposition: A | Discharge status: Home / Self Care | Payer: PPO | Attending: Physician Assistant | Admitting: Physician Assistant

## 2020-11-23 ENCOUNTER — Ambulatory Visit (INDEPENDENT_AMBULATORY_CARE_PROVIDER_SITE_OTHER): Payer: PPO

## 2020-11-23 ENCOUNTER — Other Ambulatory Visit: Payer: Self-pay

## 2020-11-23 DIAGNOSIS — R051 Acute cough: Secondary | ICD-10-CM | POA: Diagnosis not present

## 2020-11-23 DIAGNOSIS — J441 Chronic obstructive pulmonary disease with (acute) exacerbation: Secondary | ICD-10-CM

## 2020-11-23 DIAGNOSIS — J019 Acute sinusitis, unspecified: Secondary | ICD-10-CM | POA: Diagnosis not present

## 2020-11-23 DIAGNOSIS — R0981 Nasal congestion: Secondary | ICD-10-CM

## 2020-11-23 DIAGNOSIS — R059 Cough, unspecified: Secondary | ICD-10-CM

## 2020-11-23 MED ORDER — GUAIFENESIN-CODEINE 100-10 MG/5ML PO SYRP: 5.0000 mL | ORAL_SOLUTION | Freq: Four times a day (QID) | ORAL | 0 refills | Status: DC | PRN

## 2020-11-23 MED ORDER — AZITHROMYCIN 250 MG PO TABS: 250.0000 mg | ORAL_TABLET | Freq: Every day | ORAL | 0 refills | Status: DC

## 2020-11-23 NOTE — ED Provider Notes (Signed)
[facility]    CSN: [id number] Arrival date & time: 11/23/20  1001      History   Chief Complaint Chief Complaint  Patient presents with   Cough    HPI Antonio [name] is a 75 y.o. male with significant medical history for asthma/COPD, paroxysmal SVT and atrial fibrillation.  Today, patient has presenting for 2-3-week history of nasal congestion with yellow/green rhinorrhea and productive cough.  Also admits to some sinus pressure.  He says it feels like there is a lot of mucus in his chest.  Patient did contact his pulmonologist office on November 12, 2020 and was prescribed 6-day prednisone taper and doxycycline for 7 days.  Patient reports completing these medications about a week ago.  He states that he is still feeling poorly and having difficulty sleeping due to the cough.  He reports that he had a negative COVID test.  Patient says he feels no better or worse than he did when he was initially treated 11 days ago.  He denies fever, chest pain or increase in shortness of breath.  Of note, patient was in atrial fibrillation 3 days ago when he went to see his cardiologist.  He was advised to follow-up on 11/28/2020 for this to have repeat EKG.  Patient is denying any pain in his chest, dizziness, presyncope or syncope.  No other complaints today.  HPI  Past Medical History:  Diagnosis Date   Allergy    Calf pain    COPD (chronic obstructive pulmonary disease) (HCC)    no Oxygen per pt   COVID-19    Dyspnea    Hypokalemia    Internal hemorrhoids    Internal hemorrhoids    Muscle cramp    Muscle pain    Pneumonia    PONV (postoperative nausea and vomiting)    Tubular adenoma of colon    Ulcerative colitis [facility])     Patient Active Problem List   Diagnosis Date Noted   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   Peripheral vascular disease ([place]) 11/08/2019   Paroxysmal atrial fibrillation ([place]) 09/02/2019   Stiffness of finger joint of left hand 04/06/2019    History of COVID-19 01/20/2019   Severe persistent asthma without complication 40/98/1191   Medication management 09/20/2018   Chronic obstructive airway disease ([place]) 09/20/2018   Increased eosinophils in the blood 06/21/2018   Diffusion capacity of lung (dl), decreased 03/11/2018   COPD with acute exacerbation ([place]) 06/22/2017   Atrial flutter, paroxysmal ([place]) 10/17/2016   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/2016   PSVT (paroxysmal supraventricular tachycardia) ([place]) 10/17/2016   Ulcerative colitis with complication ([employer]) 47/82/9562   Cervical stenosis of spinal canal 05/05/2016   Carotid stenosis 05/02/2016   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/02/2016   Cramping of hands 01/10/2016   Bilateral carpal tunnel syndrome 12/06/2015   Diarrhea 06/09/2014   Centrilobular Emphysema 04/25/2014   Former smoker 04/25/2014   Tobacco abuse 04/25/2014    Past Surgical History:  Procedure Laterality Date   ANTERIOR CERVICAL DECOMP/DISCECTOMY FUSION N/A 05/05/2016   Procedure: ANTERIOR CERVICAL DECOMPRESSION FUSION CERVICAL THREE-FOUR.;  Surgeon: Earnie Larsson, MD;  Location: [place];  Service: Neurosurgery;  Laterality: N/A;  right side approach   CERVICAL DISCECTOMY  04/2016   C3-C4   COLONOSCOPY     COLONOSCOPY  08/09/2015   HEMORRHOID SURGERY  1988   leg stent Left approx 5-6 yrs ago   2 to left leg   MENISCUS REPAIR Left 12/2013  POLYPECTOMY     ROTATOR CUFF REPAIR Left 2007   TONSILLECTOMY AND ADENOIDECTOMY  1957       Home Medications    Prior to Admission medications   Medication Sig Start Date End Date Taking? Authorizing Provider  albuterol (PROVENTIL) (2.5 MG/3ML) 0.083% nebulizer solution Take 3 mLs (2.5 mg total) by nebulization every 6 (six) hours as needed for wheezing or shortness of breath. 06/05/20  Yes Parrett, Tammy S, NP  Ascorbic Acid 100 MG CHEW Chew by mouth.   Yes [provider]  azelastine (ASTELIN) 0.1 % nasal  spray Place 1 spray into both nostrils 2 (two) times daily. Use in each nostril as directed 06/05/20  Yes Parrett, Tammy S, NP  azithromycin (ZITHROMAX) 250 MG tablet Take 1 tablet (250 mg total) by mouth daily. Take first 2 tablets together, then 1 every day until finished. 11/23/20  Yes Danton Clap, PA-C  Budeson-Glycopyrrol-Formoterol (BREZTRI AEROSPHERE) 160-9-4.8 MCG/ACT AERO Inhale 2 puffs into the lungs in the morning and at bedtime. 06/05/20  Yes Parrett, Tammy S, NP  Budeson-Glycopyrrol-Formoterol (BREZTRI AEROSPHERE) 160-9-4.8 MCG/ACT AERO Inhale 2 puffs into the lungs in the morning and at bedtime. 11/01/20  Yes Parrett, Tammy S, NP  dupilumab (DUPIXENT) 300 MG/2ML prefilled syringe Inject 300 mg into the skin every 14 (fourteen) days. 10/05/20  Yes Parrett, Tammy S, NP  ELIQUIS 5 MG TABS tablet Take 5 mg by mouth 2 (two) times daily. 09/06/19  Yes [provider]  EPINEPHrine 0.3 mg/0.3 mL IJ SOAJ injection Inject 0.3 mg into the muscle as needed for anaphylaxis. 06/05/20  Yes Parrett, Tammy S, NP  famotidine (PEPCID) 40 MG tablet Take 1 tablet (40 mg total) by mouth daily. 07/19/19  Yes Pyrtle, Lajuan Lines, MD  guaiFENesin-codeine [facility]) 100-10 MG/5ML syrup Take 5 mLs by mouth 4 (four) times daily as needed for cough. 11/23/20  Yes Laurene Footman B, PA-C  hydrocortisone 2.5 % cream APPLY TO AFFECTED SCALY AREAS ON FACE TWICE DAILY AS NEEDED 07/05/19  Yes [provider]  ipratropium (ATROVENT) 0.02 % nebulizer solution Take 2.5 mLs (0.5 mg total) by nebulization 4 (four) times daily. 06/05/20  Yes Parrett, Fonnie Mu, NP  ketoconazole (NIZORAL) 2 % shampoo  07/05/19  Yes [provider]  Magnesium Gluconate (MAGNESIUM 27 PO) Take by mouth.   Yes [provider]  MAGNESIUM PO Take by mouth.   Yes [provider]  mesalamine (LIALDA) 1.2 g EC tablet TAKE 4 TABLETS BY MOUTH DAILY WITH BREAKFAST 10/03/20  Yes Pyrtle, Lajuan Lines, MD  metoprolol succinate  (TOPROL-XL) 25 MG 24 hr tablet Take 25 mg by mouth daily. 09/14/19  Yes [provider]  Multiple Vitamins-Minerals (ZINC PO) Take by mouth.   Yes [provider]  predniSONE (STERAPRED UNI-PAK 21 TAB) 10 MG (21) TBPK tablet Take by mouth daily. Take as directed 11/12/20  Yes Tyler Pita, MD  Tiotropium Bromide Monohydrate 2.5 MCG/ACT AERS Inhale into the lungs. 08/10/18  Yes [provider]  VITAMIN D PO Take by mouth.   Yes [provider]  Budeson-Glycopyrrol-Formoterol (BREZTRI AEROSPHERE) 160-9-4.8 MCG/ACT AERO Inhale 2 puffs into the lungs 2 (two) times daily. 11/16/19   Lauraine Rinne, NP    Family History Family History  Problem Relation Age of Onset   Cancer Mother        leukemia   Colon polyps Mother    Heart disease Father    Heart attack Father    Healthy Daughter  Healthy Son    Colon cancer Neg Hx    Esophageal cancer Neg Hx    Rectal cancer Neg Hx    Stomach cancer Neg Hx     Social History Social History   Tobacco Use   Smoking status: Former    Packs/day: 0.50    Years: 35.00    Pack years: 17.50    Types: Cigarettes, Cigars    Quit date: 06/11/2011    Years since quitting: 9.4   Smokeless tobacco: Never   Tobacco comments:    .75 packs a day.   Vaping Use   Vaping Use: Former  Substance Use Topics   Alcohol use: Yes    Alcohol/week: 7.0 standard drinks    Types: 7 Standard drinks or equivalent per week    Comment: 1-2 day    Drug use: No     Allergies   Penicillins   Review of Systems Review of Systems  Constitutional:  Negative for fatigue and fever.  HENT:  Positive for congestion, rhinorrhea and sinus pressure. Negative for sore throat.   Respiratory:  Positive for cough. Negative for shortness of breath.   Cardiovascular:  Negative for chest pain.  Gastrointestinal:  Negative for abdominal pain, diarrhea, nausea and vomiting.  Musculoskeletal:  Negative for myalgias.  Neurological:  Negative for  weakness, light-headedness and headaches.  Hematological:  Negative for adenopathy.  Psychiatric/Behavioral:  Positive for sleep disturbance.     Physical Exam Triage Vital Signs ED Triage Vitals [11/23/20 1118]  Enc Vitals Group     BP (!) 123/58     Pulse Rate 62     Resp 18     Temp 98.8 F (37.1 C)     Temp Source Oral     SpO2 96 %     Weight 149 lb (67.6 kg)     Height 5' 11"  (1.803 m)     Head Circumference      Peak Flow      Pain Score 0     Pain Loc      Pain Edu?      Excl. in [place]?    No data found.  Updated Vital Signs BP (!) 123/58 (BP Location: Left Arm)   Pulse 62   Temp 98.8 F (37.1 C) (Oral)   Resp 18   Ht 5' 11"  (1.803 m)   Wt 149 lb (67.6 kg)   SpO2 96%   BMI 20.78 kg/m     Physical Exam Vitals and nursing note reviewed.  Constitutional:      General: He is not in acute distress.    Appearance: Normal appearance. He is well-developed. He is not ill-appearing.  HENT:     Head: Normocephalic and atraumatic.     Nose: Congestion and rhinorrhea (small amount of yellowish/milky drainage) present.     Mouth/Throat:     Mouth: Mucous membranes are moist.     Pharynx: Oropharynx is clear.  Eyes:     General: No scleral icterus.    Conjunctiva/sclera: Conjunctivae normal.  Cardiovascular:     Rate and Rhythm: Normal rate and regular rhythm.     Heart sounds: Normal heart sounds.  Pulmonary:     Effort: Pulmonary effort is normal. No respiratory distress.     Breath sounds: Rhonchi (few scattered rhonchi) present.  Musculoskeletal:     Cervical back: Neck supple.  Skin:    General: Skin is warm and dry.  Neurological:     General: No focal deficit  present.     Mental Status: He is alert. Mental status is at baseline.     Motor: No weakness.     Coordination: Coordination normal.     Gait: Gait normal.  Psychiatric:        Mood and Affect: Mood normal.        Behavior: Behavior normal.        Thought Content: Thought content normal.      UC Treatments / Results  Labs (all labs ordered are listed, but only abnormal results are displayed) Labs Reviewed - No data to display  EKG   Radiology DG Chest 2 View  Result Date: 11/23/2020 CLINICAL DATA:  Cough for 3 weeks. EXAM: CHEST - 2 VIEW COMPARISON:  06/05/2020 FINDINGS: The heart size and mediastinal contours are within normal limits. Scarring is again seen within both lower lung zones. No pleural effusion or edema. Both lungs are clear. The visualized skeletal structures are unremarkable. IMPRESSION: No active cardiopulmonary abnormalities. Electronically Signed   By: Kerby Moors M.D.   On: 11/23/2020 12:14    Procedures Procedures (including critical care time)  Medications Ordered in UC Medications - No data to display  Initial Impression / Assessment and Plan / UC Course  I have reviewed the triage vital signs and the nursing notes.  Pertinent labs & imaging results that were available during my care of the patient were reviewed by me and considered in my medical decision making (see chart for details).  75 year old male presenting for 2 to 3-week history of discolored nasal congestion/sinus pressure and productive cough with sleep disturbance.  Patient treated with 1 week course of doxycycline and 6-day course of prednisone which she completed about a week ago.  Reports feeling no better or worse.  Denies fever or breathing difficulty.  Vitals all stable today and he is overall well-appearing.  He does have nasal congestion/mucosal edema and yellowish/milky drainage from his nose.  Also few scattered rhonchi throughout chest.  No respiratory distress.  Oxygen is 96%.  Chest x-ray obtained today to assess for underlying pneumonia.  Chest x-ray independently viewed by me.  Chest x-ray is normal.  Reviewed this with patient.  Suspect patient having ongoing COPD exacerbation/acute sinusitis.  He has significant penicillin allergy and is already been treated  with doxycycline.  We will try azithromycin at this time and Cheratussin.  Patient has had this medicine before and done well.  Advised him to increase rest and fluids and continue with his at home inhalers and breathing treatments.  Thoroughly reviewed return and ED precautions advised to keep follow-up appointment with cardiologist but he is not in A. fib today.  Advise contacting pulmonologist if still feeling poorly after the next week.  Final Clinical Impressions(s) / UC Diagnoses   Final diagnoses:  COPD exacerbation (HCC)  Acute cough  Nasal congestion  Acute sinusitis, recurrence not specified, unspecified location     Discharge Instructions      -Your chest x-ray is normal.  No evidence of pneumonia. -You appear to be having continued COPD exacerbation.  I have sent in a different antibiotics to pharmacy and slowing for cough. -Make sure to increase rest and fluids and use your breathing treatments/inhalers as needed and as directed. -Contact pulmonologist if you are still not feeling better after the next 5 to 7 days. -Return or go to emergency department if you develop fever, worsening cough, or breathing problem.   -Keep follow-up appointment with your cardiologist regarding A. fib but  your rhythm is regular today.      ED Prescriptions     Medication Sig Dispense Auth. Provider   guaiFENesin-codeine (ROBITUSSIN AC) 100-10 MG/5ML syrup Take 5 mLs by mouth 4 (four) times daily as needed for cough. 120 mL Laurene Footman B, PA-C   azithromycin (ZITHROMAX) 250 MG tablet Take 1 tablet (250 mg total) by mouth daily. Take first 2 tablets together, then 1 every day until finished. 6 tablet Danton Clap, PA-C      I have reviewed the PDMP during this encounter.   Danton Clap, PA-C 11/23/20 1230

## 2020-11-23 NOTE — Discharge Instructions (Signed)
-  Your chest x-ray is normal.  No evidence of pneumonia. -You appear to be having continued COPD exacerbation.  I have sent in a different antibiotics to pharmacy and slowing for cough. -Make sure to increase rest and fluids and use your breathing treatments/inhalers as needed and as directed. -Contact pulmonologist if you are still not feeling better after the next 5 to 7 days. -Return or go to emergency department if you develop fever, worsening cough, or breathing problem.   -Keep follow-up appointment with your cardiologist regarding A. fib but your rhythm is regular today.

## 2020-11-23 NOTE — ED Triage Notes (Signed)
Pt here with C/O cough, had cold for 1.5weeks, Pulmonologist  and was given steroids and antibiotics, he finished that 1 week ago. Is having problems sleeping from coughing so much.

## 2020-11-27 ENCOUNTER — Ambulatory Visit: Payer: PPO | Admitting: Adult Health

## 2020-11-27 NOTE — Telephone Encounter (Signed)
Ref is for a-fib, not appropriate for AHF clinic, will forward to afib clinic/gen cards

## 2020-11-28 DIAGNOSIS — I480 Paroxysmal atrial fibrillation: Secondary | ICD-10-CM | POA: Diagnosis not present

## 2020-11-29 ENCOUNTER — Telehealth: Payer: Self-pay | Admitting: Adult Health

## 2020-11-29 ENCOUNTER — Ambulatory Visit: Payer: PPO | Admitting: Family Medicine

## 2020-11-29 ENCOUNTER — Telehealth: Payer: Self-pay

## 2020-11-29 NOTE — Telephone Encounter (Signed)
Spoke to patient via telephone.  Patient stated that he was seen at [facility] on 11/23/2020 for cough, congestion and wheezing. Tx with azithromycin and Robitussin AC. He completed abx three days ago with some improvement in sx.  C/o wheezing, prod cough with yellow sputum and chest congestion x2.5w.  Sob is baseline. Denied f/c/s or additional sx. Using Robitussin TID, Breztri BID and albuterol HFA 3-4x daily.  Last round of prednisone 11/12/2020. Negative covid test 1 week ago. Fully vaccinated against covid.  Tammy, please advise. Thanks

## 2020-11-29 NOTE — Telephone Encounter (Signed)
I can see him tomorrow I am working in [place] office. Can double book by in am or at 2

## 2020-11-29 NOTE — Telephone Encounter (Signed)
Per Cindy(wife) ptcalled 9/28, given steroids and abx, went to [facility] instead of following up with [place]. Had a chest x ray-didn't find anything and placed on another abx. Pt is still having wheezing today and chest congestion. PCP advised pt to f/u with pulm, but would see him if we didn't have anything. Please advise [phone]

## 2020-11-29 NOTE — Telephone Encounter (Signed)
Copied from [place] [id number]. Topic: Appointment Scheduling - Scheduling Inquiry for Clinic >> Nov 29, 2020  8:21 AM Valere Dross wrote: FYI: Pts wife called in stating they had talked to [facility] Flattery this morning about possibly missing there appt to go somewhere else, but they called back to let [facility] Fl[facility] know they will still be coming in today at 11:40, please advise.

## 2020-11-29 NOTE — Telephone Encounter (Signed)
Appt scheduled for 11/30/2020 at 12:00-patient to arrive around 1145. Patient is aware and voiced his understanding.  Nothing further needed at this time.

## 2020-11-30 ENCOUNTER — Other Ambulatory Visit: Payer: Self-pay

## 2020-11-30 ENCOUNTER — Ambulatory Visit: Payer: PPO | Admitting: Family Medicine

## 2020-11-30 ENCOUNTER — Ambulatory Visit: Payer: PPO | Admitting: Adult Health

## 2020-11-30 ENCOUNTER — Encounter: Payer: Self-pay | Admitting: Adult Health

## 2020-11-30 VITALS — BP 132/64 | HR 58 | Temp 97.5°F | Ht 71.0 in | Wt 145.4 lb

## 2020-11-30 DIAGNOSIS — J441 Chronic obstructive pulmonary disease with (acute) exacerbation: Secondary | ICD-10-CM | POA: Diagnosis not present

## 2020-11-30 DIAGNOSIS — J449 Chronic obstructive pulmonary disease, unspecified: Secondary | ICD-10-CM | POA: Diagnosis not present

## 2020-11-30 DIAGNOSIS — I4819 Other persistent atrial fibrillation: Secondary | ICD-10-CM | POA: Diagnosis not present

## 2020-11-30 MED ORDER — PREDNISONE 20 MG PO TABS: ORAL_TABLET | ORAL | 0 refills | Status: DC

## 2020-11-30 NOTE — Progress Notes (Signed)
@Patient  ID: Antonio [name], male    DOB: December 01, 1945, 75 y.o.   MRN: [id number]  Chief Complaint  Patient presents with   Follow-up    Referring provider: [name], MD  HPI: 75 year old male former smoker quit in 2013 followed for severe COPD with emphysema and asthma.  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 DLCO 16.26 (44% predicted)   December 2017 lung function testing showed ratio 50%, FEV1 1.47 L 43% predicted, FVC 2.96 L 67% predicted   March 2020 pulmonary function testing ratio 38%, FEV1 0.97 L 31% predicted, total lung capacity 8.44 L 119% predicted, residual volume 198% predicted, DLCO 14.0 55% predicted   March 2020 high-resolution CT scan of the chest images : Centrilobular emphysema, no interstitial lung disease, aortic atherosclerosis   04/20/2018-eosinophils relative 7.3, eosinophils absolute 0.6 04/20/2018-IgE-67   01/20/2019-SARS-CoV-2-detected  Pets: Dogs, no cats, birds, farm animals Occupation: Retired Museum/gallery conservator Exposures: No known exposures.  No mold, hot tub, Jacuzzi.  No feather pillows or comforters Smoking history: 26-pack-year smoker.  Quit in 2013 Travel history: Originally from [place].  Moved to [place] in the 1950s.  No significant recent travel Relevant family history: No significant family history of lung disease  11/30/2020 Follow up ; COPD, with emphysema and asthma Patient returns for an acute office visit.  Patient complains over the last 2 to 3 weeks he has had increased cough congestion.  He was initially called in doxycycline and prednisone.  Continued to have  ongoing symptoms and was seen at urgent care.  Chest x-ray showed stable COPD changes.  And he was placed on a Z-Pak.  Last week.  Patient unfortunately continues to have some ongoing cough and wheezing.  He remains on Breztri inhaler.  Along with the [place]. Home COVID testing was negative. Patient says he is doing some better but cough is lingering . No fever or hemoptysis .    Allergies  Allergen Reactions   Penicillins Rash    Has patient had a PCN reaction causing immediate rash, facial/tongue/throat swelling, SOB or lightheadedness with hypotension:Yes Has patient had a PCN reaction causing severe rash involving mucus membranes or skin necrosis:Yes Has patient had a PCN reaction that required hospitalization:No Has patient had a PCN reaction occurring within the last 10 years:No If all of the above answers are "NO", then may proceed with Cephalosporin use.     Immunization History  Administered Date(s) Administered   Fluad Quad(high Dose 65+) 11/01/2020   Influenza Inj Mdck Quad Pf 10/28/2017   Influenza Split 11/24/2013   Influenza, High Dose Seasonal PF 11/05/2016, 10/29/2017, 10/06/2018, 11/03/2019   Influenza,inj,Quad PF,6+ Mos 11/08/2014, 11/26/2015   Influenza-Unspecified 10/11/2013, 10/11/2013   PFIZER(Purple Top)SARS-COV-2 Vaccination 05/06/2019, 05/31/2019, 01/27/2020   Pneumococcal Conjugate-13 04/18/2014   Pneumococcal Polysaccharide-23 11/26/2015   Tdap 02/13/2011   Zoster Recombinat (Shingrix) 12/01/2016, 03/02/2017   Zoster, Live 11/07/2010    Past Medical History:  Diagnosis Date   Allergy    Calf pain    COPD (chronic obstructive pulmonary disease) (HCC)    no Oxygen per pt   COVID-19    Dyspnea    Hypokalemia    Internal hemorrhoids    Internal hemorrhoids  Muscle cramp    Muscle pain    Pneumonia    PONV (postoperative nausea and vomiting)    Tubular adenoma of colon    Ulcerative colitis ([place])     Tobacco History: Social History    Tobacco Use  Smoking Status Former   Packs/day: 0.50   Years: 35.00   Pack years: 17.50   Types: Cigarettes, Cigars   Quit date: 06/11/2011   Years since quitting: 9.4  Smokeless Tobacco Never  Tobacco Comments   .75 packs a day.    Counseling given: Not Answered Tobacco comments: .75 packs a day.    Outpatient Medications Prior to Visit  Medication Sig Dispense Refill   albuterol (PROVENTIL) (2.5 MG/3ML) 0.083% nebulizer solution Take 3 mLs (2.5 mg total) by nebulization every 6 (six) hours as needed for wheezing or shortness of breath. 120 mL 12   azelastine (ASTELIN) 0.1 % nasal spray Place 1 spray into both nostrils 2 (two) times daily. Use in each nostril as directed 30 mL 12   Budeson-Glycopyrrol-Formoterol (BREZTRI AEROSPHERE) 160-9-4.8 MCG/ACT AERO Inhale 2 puffs into the lungs in the morning and at bedtime. 5.9 g 0   dupilumab (DUPIXENT) 300 MG/2ML prefilled syringe Inject 300 mg into the skin every 14 (fourteen) days. 12 mL 1   ELIQUIS 5 MG TABS tablet Take 5 mg by mouth 2 (two) times daily.     EPINEPHrine 0.3 mg/0.3 mL IJ SOAJ injection Inject 0.3 mg into the muscle as needed for anaphylaxis. 1 each 0   hydrocortisone 2.5 % cream APPLY TO AFFECTED SCALY AREAS ON FACE TWICE DAILY AS NEEDED     ipratropium (ATROVENT) 0.02 % nebulizer solution Take 2.5 mLs (0.5 mg total) by nebulization 4 (four) times daily. 75 mL 12   mesalamine (LIALDA) 1.2 g EC tablet TAKE 4 TABLETS BY MOUTH DAILY WITH BREAKFAST 120 tablet 0   metoprolol succinate (TOPROL-XL) 25 MG 24 hr tablet Take 25 mg by mouth daily.     Multiple Vitamins-Minerals (ZINC PO) Take by mouth.     Tiotropium Bromide Monohydrate 2.5 MCG/ACT AERS Inhale into the lungs.     VITAMIN D PO Take by mouth.     Ascorbic Acid 100 MG CHEW Chew by mouth. (Patient not taking: Reported on 11/30/2020)     Budeson-Glycopyrrol-Formoterol (BREZTRI AEROSPHERE) 160-9-4.8 MCG/ACT AERO Inhale 2 puffs into the lungs 2 (two) times daily. 5.9 g 0    Budeson-Glycopyrrol-Formoterol (BREZTRI AEROSPHERE) 160-9-4.8 MCG/ACT AERO Inhale 2 puffs into the lungs in the morning and at bedtime. (Patient not taking: Reported on 11/30/2020) 10.7 g 0   famotidine (PEPCID) 40 MG tablet Take 1 tablet (40 mg total) by mouth daily. (Patient not taking: Reported on 11/30/2020) 90 tablet 1   guaiFENesin-codeine (ROBITUSSIN AC) 100-10 MG/5ML syrup Take 5 mLs by mouth 4 (four) times daily as needed for cough. (Patient not taking: Reported on 11/30/2020) 120 mL 0   ketoconazole (NIZORAL) 2 % shampoo  (Patient not taking: Reported on 11/30/2020)     Magnesium Gluconate (MAGNESIUM 27 PO) Take by mouth. (Patient not taking: Reported on 11/30/2020)     MAGNESIUM PO Take by mouth. (Patient not taking: Reported on 11/30/2020)     No facility-administered medications prior to visit.     Review of Systems:   Constitutional:   No  weight loss, night sweats,  Fevers, chills,  +fatigue, or  lassitude.  HEENT:   No headaches,  Difficulty swallowing,  Tooth/dental problems, or  Sore throat,  No sneezing, itching, ear ache, + nasal congestion, post nasal drip,   CV:  No chest pain,  Orthopnea, PND, swelling in lower extremities, anasarca, dizziness, palpitations, syncope.   GI  No heartburn, indigestion, abdominal pain, nausea, vomiting, diarrhea, change in bowel habits, loss of appetite, bloody stools.   Resp:  No chest wall deformity  Skin: no rash or lesions.  GU: no dysuria, change in color of urine, no urgency or frequency.  No flank pain, no hematuria   MS:  No joint pain or swelling.  No decreased range of motion.  No back pain.    Physical Exam  BP 132/64 (BP Location: Left Arm, Patient Position: Sitting, Cuff Size: Normal)   Pulse (!) 58   Temp (!) 97.5 F (36.4 C) (Oral)   Ht 5' 11"  (1.803 m)   Wt 145 lb 6.4 oz (66 kg)   SpO2 95%   BMI 20.28 kg/m   GEN: A/Ox3; pleasant , NAD, well nourished    HEENT:  [place]/AT,  NOSE-clear,  THROAT-clear, no lesions, no postnasal drip or exudate noted.   NECK:  Supple w/ fair ROM; no JVD; normal carotid impulses w/o bruits; no thyromegaly or nodules palpated; no lymphadenopathy.    RESP  Clear  P & A; w/o, wheezes/ rales/ or rhonchi. no accessory muscle use, no dullness to percussion  CARD:  RRR, no m/r/g, no peripheral edema, pulses intact, no cyanosis or clubbing.  GI:   Soft & nt; nml bowel sounds; no organomegaly or masses detected.   Musco: Warm bil, no deformities or joint swelling noted.   Neuro: alert, no focal deficits noted.    Skin: Warm, no lesions or rashes   BMET   BNP No results found for: BNP  ProBNP No results found for: PROBNP  Imaging: DG Chest 2 View  Result Date: 11/23/2020 CLINICAL DATA:  Cough for 3 weeks. EXAM: CHEST - 2 VIEW COMPARISON:  06/05/2020 FINDINGS: The heart size and mediastinal contours are within normal limits. Scarring is again seen within both lower lung zones. No pleural effusion or edema. Both lungs are clear. The visualized skeletal structures are unremarkable. IMPRESSION: No active cardiopulmonary abnormalities. Electronically Signed   By: Kerby Moors M.D.   On: 11/23/2020 12:14      PFT Results Latest Ref Rng & Units 04/20/2018 06/07/2014  FVC-Pre L 2.56 2.80  FVC-Predicted Pre % 60 57  FVC-Post L 2.60 2.90  FVC-Predicted Post % 61 59  Pre FEV1/FVC % % 38 52  Post FEV1/FCV % % 39 51  FEV1-Pre L 0.97 1.46  FEV1-Predicted Pre % 31 40  FEV1-Post L 1.02 1.47  DLCO uncorrected ml/min/mmHg 14.01 16.26  DLCO UNC% % 55 44  DLVA Predicted % 68 55  TLC L 8.44 -  TLC % Predicted % 119 -  RV % Predicted % 198 -    No results found for: NITRICOXIDE      Assessment & Plan:   COPD with acute exacerbation (HCC) Slowly resolving flare  Check sputum culture  Short steroid burst   Plan  Patient Instructions  Prednisone 51m daily for 5 days , then 166mdaily for 5 days and stop.  Mucinex DM liquid 1-2 tsp Twice  daily  for cough/congestion .  Sputum culture .  Continue on BREZTRI 1-2 puffs Twice daily , rinse after use.  Continue on Dupixent every 2 weeks  Albuterol inhaler As needed   Activity as tolerated.  High protein diet .  Albuterol inhaler or  albuterol/ipratropium neb As needed   Follow up with Dr Patsey Berthold in 2-3 months and As needed   Please contact office for sooner follow up if symptoms do not improve or worsen or seek emergency care       I spent   33 minutes dedicated to the care of this patient on the date of this encounter to include pre-visit review of records, face-to-face time with the patient discussing conditions above, post visit ordering of testing, clinical documentation with the electronic health record, making appropriate referrals as documented, and communicating necessary findings to members of the patients care team.    Rexene Edison, NP 11/30/2020

## 2020-11-30 NOTE — Patient Instructions (Addendum)
Prednisone 58m daily for 5 days , then 164mdaily for 5 days and stop.  Mucinex DM liquid 1-2 tsp Twice daily  for cough/congestion .  Sputum culture .  Continue on BREZTRI 1-2 puffs Twice daily , rinse after use.  Continue on Dupixent every 2 weeks  Albuterol inhaler As needed   Activity as tolerated.  High protein diet .  Albuterol inhaler or albuterol/ipratropium neb As needed   Follow up with Dr GoPatsey Bertholdn 2-3 months and As needed   Please contact office for sooner follow up if symptoms do not improve or worsen or seek emergency care

## 2020-11-30 NOTE — Assessment & Plan Note (Signed)
Slowly resolving flare  Check sputum culture  Short steroid burst   Plan  Patient Instructions  Prednisone 5m daily for 5 days , then 138mdaily for 5 days and stop.  Mucinex DM liquid 1-2 tsp Twice daily  for cough/congestion .  Sputum culture .  Continue on BREZTRI 1-2 puffs Twice daily , rinse after use.  Continue on Dupixent every 2 weeks  Albuterol inhaler As needed   Activity as tolerated.  High protein diet .  Albuterol inhaler or albuterol/ipratropium neb As needed   Follow up with Dr GoPatsey Bertholdn 2-3 months and As needed   Please contact office for sooner follow up if symptoms do not improve or worsen or seek emergency care

## 2020-12-04 NOTE — Progress Notes (Signed)
Agree with the details of the visit as noted by Tammy Parrett, NP.  C. Laura Saleh Ulbrich, MD [facility] 

## 2020-12-14 ENCOUNTER — Encounter: Payer: PPO | Admitting: Family Medicine

## 2020-12-17 ENCOUNTER — Ambulatory Visit (INDEPENDENT_AMBULATORY_CARE_PROVIDER_SITE_OTHER): Payer: PPO | Admitting: Family Medicine

## 2020-12-17 ENCOUNTER — Encounter: Payer: Self-pay | Admitting: Family Medicine

## 2020-12-17 ENCOUNTER — Other Ambulatory Visit: Payer: Self-pay

## 2020-12-17 ENCOUNTER — Telehealth: Payer: Self-pay

## 2020-12-17 VITALS — BP 110/64 | HR 62 | Ht 71.0 in | Wt 146.0 lb

## 2020-12-17 DIAGNOSIS — S41111A Laceration without foreign body of right upper arm, initial encounter: Secondary | ICD-10-CM

## 2020-12-17 DIAGNOSIS — R351 Nocturia: Secondary | ICD-10-CM | POA: Diagnosis not present

## 2020-12-17 DIAGNOSIS — E7801 Familial hypercholesterolemia: Secondary | ICD-10-CM | POA: Diagnosis not present

## 2020-12-17 DIAGNOSIS — Z0000 Encounter for general adult medical examination without abnormal findings: Secondary | ICD-10-CM

## 2020-12-17 MED ORDER — "XEROFORM PETROLATUM DRES 4""X4"" 3 % EX PADS": 1.0000 | MEDICATED_PAD | CUTANEOUS | 0 refills | Status: DC

## 2020-12-17 NOTE — Telephone Encounter (Signed)
Copied from [place] [id number]. Topic: General - Other >> Dec 17, 2020  9:59 AM Yvette Rack wrote: Reason for CRM: Pt stated Baxter Flattery was checking on something for him and he was calling to get an update. Pt requests call back. Cb# [phone]

## 2020-12-17 NOTE — Progress Notes (Signed)
Date:  12/17/2020   Name:  Antonio [name]   DOB:  1945-03-07   MRN:  [id number]   Chief Complaint: Annual Exam and Fall  Patient is a 75 year old male who presents for a comprehensive physical exam. The patient reports the following problems: fall. Health maintenance has been reviewed up to date.    Fall The accident occurred 3 to 5 days ago. Fall occurred: fell into doorframe. Impact surface: door frame. The volume of blood lost was moderate (avulsion laceration). Point of impact: right forearm. The patient is experiencing no pain. The symptoms are aggravated by use of injured limb. Pertinent negatives include no abdominal pain, fever, headaches, hematuria or nausea. Treatments tried: dressing change.   Lab Results  Component Value Date   CREATININE 0.94 12/14/2019   BUN 9 12/14/2019   NA 143 12/14/2019   K 4.5 12/14/2019   CL 104 12/14/2019   CO2 22 12/14/2019   Lab Results  Component Value Date   CHOL 173 12/14/2019   HDL 57 12/14/2019   LDLCALC 105 (H) 12/14/2019   TRIG 54 12/14/2019   CHOLHDL 2.6 12/13/2018   Lab Results  Component Value Date   TSH 1.260 12/14/2019   No results found for: HGBA1C Lab Results  Component Value Date   WBC 8.6 04/20/2018   HGB 14.9 04/20/2018   HCT 44.0 04/20/2018   MCV 93.4 04/20/2018   PLT 218.0 04/20/2018   Lab Results  Component Value Date   ALT 15 12/14/2019   AST 14 12/14/2019   ALKPHOS 77 12/14/2019   BILITOT 0.8 12/14/2019     Review of Systems  Constitutional:  Negative for chills and fever.  HENT:  Negative for drooling, ear discharge, ear pain and sore throat.   Respiratory:  Positive for shortness of breath and wheezing. Negative for cough and chest tightness.   Cardiovascular:  Negative for chest pain, palpitations and leg swelling.       A fib  Gastrointestinal:  Negative for abdominal pain, blood in stool, constipation, diarrhea and nausea.  Endocrine: Negative for polydipsia.  Genitourinary:   Negative for difficulty urinating, dysuria, frequency, hematuria and urgency.  Musculoskeletal:  Negative for back pain, myalgias and neck pain.  Skin:  Negative for rash.  Allergic/Immunologic: Negative for environmental allergies.  Neurological:  Negative for dizziness and headaches.  Hematological:  Does not bruise/bleed easily.  Psychiatric/Behavioral:  Negative for suicidal ideas. The patient is not nervous/anxious.    Patient Active Problem List   Diagnosis Date Noted   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   Peripheral vascular disease ([place]) 11/08/2019   Paroxysmal atrial fibrillation ([place]) 09/02/2019   Stiffness of finger joint of left hand 04/06/2019   History of COVID-19 01/20/2019   Severe persistent asthma without complication 19/41/7408   Medication management 09/20/2018   Chronic obstructive airway disease ([place]) 09/20/2018   Increased eosinophils in the blood 06/21/2018   Diffusion capacity of lung (dl), decreased 03/11/2018   COPD with acute exacerbation ([place]) 06/22/2017   Atrial flutter, paroxysmal ([place]) 10/17/2016   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/2016   PSVT (paroxysmal supraventricular tachycardia) ([place]) 10/17/2016   Ulcerative colitis with complication ([place]) 14/48/1856   Cervical stenosis of spinal canal 05/05/2016   Carotid stenosis 05/02/2016   Atherosclerosis of native arteries of extremity with intermittent claudication ([place]) 05/02/2016   Cramping of hands 01/10/2016   Bilateral carpal tunnel syndrome 12/06/2015   Diarrhea 06/09/2014   Centrilobular Emphysema 04/25/2014   Former smoker 04/25/2014   Tobacco  abuse 04/25/2014    Allergies  Allergen Reactions   Penicillins Rash    Has patient had a PCN reaction causing immediate rash, facial/tongue/throat swelling, SOB or lightheadedness with hypotension:Yes Has patient had a PCN reaction causing severe rash involving mucus membranes or skin necrosis:Yes Has patient had a PCN reaction that required hospitalization:No Has  patient had a PCN reaction occurring within the last 10 years:No If all of the above answers are "NO", then may proceed with Cephalosporin use.     Past Surgical History:  Procedure Laterality Date   ANTERIOR CERVICAL DECOMP/DISCECTOMY FUSION N/A 05/05/2016   Procedure: ANTERIOR CERVICAL DECOMPRESSION FUSION CERVICAL THREE-FOUR.;  Surgeon: Earnie Larsson, MD;  Location: [place];  Service: Neurosurgery;  Laterality: N/A;  right side approach   CERVICAL DISCECTOMY  04/2016   C3-C4   COLONOSCOPY     COLONOSCOPY  08/09/2015   HEMORRHOID SURGERY  1988   leg stent Left approx 5-6 yrs ago   2 to left leg   MENISCUS REPAIR Left 12/2013   POLYPECTOMY     ROTATOR CUFF REPAIR Left 2007   TONSILLECTOMY AND ADENOIDECTOMY  1957    Social History   Tobacco Use   Smoking status: Former    Packs/day: 0.50    Years: 35.00    Pack years: 17.50    Types: Cigarettes, Cigars    Quit date: 06/11/2011    Years since quitting: 9.5   Smokeless tobacco: Never   Tobacco comments:    .75 packs a day.   Vaping Use   Vaping Use: Former  Substance Use Topics   Alcohol use: Yes    Alcohol/week: 7.0 standard drinks    Types: 7 Standard drinks or equivalent per week    Comment: 1-2 day    Drug use: No     Medication list has been reviewed and updated.  Current Meds  Medication Sig   albuterol (PROVENTIL) (2.5 MG/3ML) 0.083% nebulizer solution Take 3 mLs (2.5 mg total) by nebulization every 6 (six) hours as needed for wheezing or shortness of breath.   azelastine (ASTELIN) 0.1 % nasal spray Place 1 spray into both nostrils 2 (two) times daily. Use in each nostril as directed   Budeson-Glycopyrrol-Formoterol (BREZTRI AEROSPHERE) 160-9-4.8 MCG/ACT AERO Inhale 2 puffs into the lungs 2 (two) times daily.   Budeson-Glycopyrrol-Formoterol (BREZTRI AEROSPHERE) 160-9-4.8 MCG/ACT AERO Inhale 2 puffs into the lungs in the morning and at bedtime.   Budeson-Glycopyrrol-Formoterol (BREZTRI AEROSPHERE) 160-9-4.8 MCG/ACT  AERO Inhale 2 puffs into the lungs in the morning and at bedtime.   dupilumab (DUPIXENT) 300 MG/2ML prefilled syringe Inject 300 mg into the skin every 14 (fourteen) days.   ELIQUIS 5 MG TABS tablet Take 5 mg by mouth 2 (two) times daily.   EPINEPHrine 0.3 mg/0.3 mL IJ SOAJ injection Inject 0.3 mg into the muscle as needed for anaphylaxis.   hydrocortisone 2.5 % cream APPLY TO AFFECTED SCALY AREAS ON FACE TWICE DAILY AS NEEDED   ipratropium (ATROVENT) 0.02 % nebulizer solution Take 2.5 mLs (0.5 mg total) by nebulization 4 (four) times daily.   ketoconazole (NIZORAL) 2 % shampoo    mesalamine (LIALDA) 1.2 g EC tablet TAKE 4 TABLETS BY MOUTH DAILY WITH BREAKFAST   metoprolol succinate (TOPROL-XL) 25 MG 24 hr tablet Take 25 mg by mouth daily.   Tiotropium Bromide Monohydrate 2.5 MCG/ACT AERS Inhale into the lungs.   [DISCONTINUED] Multiple Vitamins-Minerals (ZINC PO) Take by mouth.    PHQ 2/9 Scores 12/17/2020 12/14/2019 12/13/2018 07/21/2018  PHQ - 2 Score 0 0 0 0  PHQ- 9 Score 0 0 0 -    GAD 7 : Generalized Anxiety Score 12/17/2020 12/14/2019  Nervous, Anxious, on Edge 0 0  Control/stop worrying 0 0  Worry too much - different things 0 0  Trouble relaxing 0 0  Restless 0 0  Easily annoyed or irritable 0 0  Afraid - awful might happen 0 0  Total GAD 7 Score 0 0    BP Readings from Last 3 Encounters:  12/17/20 110/64  11/30/20 132/64  11/23/20 (!) 123/58    Physical Exam Vitals and nursing note reviewed.  HENT:     Head: Normocephalic.     Right Ear: Tympanic membrane, ear canal and external ear normal.     Left Ear: Tympanic membrane, ear canal and external ear normal.     Nose: Nose normal. No congestion or rhinorrhea.     Mouth/Throat:     Mouth: Mucous membranes are moist.     Pharynx: No oropharyngeal exudate or posterior oropharyngeal erythema.  Eyes:     General: Lids are normal. Vision grossly intact. Gaze aligned appropriately. No scleral icterus.       Right eye: No  discharge.        Left eye: No discharge.     Extraocular Movements: Extraocular movements intact.     Conjunctiva/sclera: Conjunctivae normal.     Pupils: Pupils are equal, round, and reactive to light.     Funduscopic exam:    Right eye: Red reflex present.        Left eye: Red reflex present. Neck:     Thyroid: No thyroid mass, thyromegaly or thyroid tenderness.     Vascular: No JVD.     Trachea: No tracheal deviation.  Cardiovascular:     Rate and Rhythm: Normal rate and regular rhythm.     Chest Wall: PMI is not displaced. No thrill.     Pulses:          Carotid pulses are 1+ on the right side and 1+ on the left side.      Radial pulses are 1+ on the right side and 1+ on the left side.       Femoral pulses are 1+ on the right side and 1+ on the left side.      Popliteal pulses are 1+ on the right side and 1+ on the left side.       Dorsalis pedis pulses are 1+ on the right side and 1+ on the left side.       Posterior tibial pulses are 1+ on the right side and 1+ on the left side.     Heart sounds: Normal heart sounds, S1 normal and S2 normal. No murmur heard. No systolic murmur is present.  No diastolic murmur is present.    No friction rub. No gallop. No S3 or S4 sounds.  Pulmonary:     Effort: No respiratory distress.     Breath sounds: Normal breath sounds. No stridor. No decreased breath sounds, wheezing, rhonchi or rales.  Chest:  Breasts:    Right: Normal.     Left: Normal.  Abdominal:     General: Bowel sounds are normal.     Palpations: Abdomen is soft. There is no hepatomegaly, splenomegaly or mass.     Tenderness: There is no abdominal tenderness. There is no guarding or rebound.     Hernia: No hernia is present. There is no hernia in  the umbilical area or ventral area.  Musculoskeletal:        General: No tenderness. Normal range of motion.     Cervical back: Full passive range of motion without pain, normal range of motion and neck supple.     Right lower  leg: No edema.     Left lower leg: No edema.  Lymphadenopathy:     Cervical: No cervical adenopathy.     Right cervical: No superficial, deep or posterior cervical adenopathy.    Left cervical: No superficial, deep or posterior cervical adenopathy.     Upper Body:     Right upper body: No supraclavicular, axillary or pectoral adenopathy.     Left upper body: No supraclavicular, axillary or pectoral adenopathy.  Skin:    General: Skin is warm.     Findings: No erythema or rash.  Neurological:     Mental Status: He is alert and oriented to person, place, and time.     Cranial Nerves: Cranial nerves 2-12 are intact. No cranial nerve deficit.     Sensory: Sensation is intact. No sensory deficit.     Motor: Motor function is intact.     Deep Tendon Reflexes: Reflexes are normal and symmetric.    Wt Readings from Last 3 Encounters:  12/17/20 146 lb (66.2 kg)  11/30/20 145 lb 6.4 oz (66 kg)  11/23/20 149 lb (67.6 kg)    BP 110/64   Pulse 62   Ht 5' 11"  (1.803 m)   Wt 146 lb (66.2 kg)   BMI 20.36 kg/m   Assessment and Plan:  Patient is a 75year old male  who presents for a comprehensive physical exam. The patient reports the following problems: Status post fall. Health maintenance has been reviewed up-to-date.  Chart was reviewed for previous encounters most recent labs most recent imaging in Care Everywhere. 1. Annual physical exam. Immunizations are reviewed and recommendations provided.   Age appropriate screening tests are discussed. Counseling given for risk factor reduction interventions.  No subjective/objective concerns noted during history and physical exam.  Patient refuses DRE for prostate.  2. Arm laceration, right, initial encounter Patient sustained a fall into a door frame sustaining a laceration of an avulsive nature of the right arm.  Daughter is dressing with Xeroform petroleum dressing every other day and patient has been provided materials for dressing changes.   Patient has been informed that if necessary we may have to refer to surgery for debridement.  Patient did not want to have removal of the dressing because of excessive bleeding on previous encounters. - Bismuth Tribromoph-Petrolatum (XEROFORM PETROLATUM DRES 4"X4") 3 % PADS; Apply 1 patch topically every other day.  Dispense: 25 each; Refill: 0  3. Nocturia Patient with history of nocturia and we will obtain a PSA to evaluate prostate patient refuses DRE. - PSA  4. Familial hypercholesterolemia Patient with history of hyperlipidemia and we will check lipid panel to follow-up.  Renal panel and CBC was reviewed from previous encounters and was not repeated at this time. - Lipid Panel With LDL/HDL Ratio

## 2020-12-18 LAB — LIPID PANEL WITH LDL/HDL RATIO
Cholesterol, Total: 175 mg/dL (ref 100–199)
HDL: 54 mg/dL (ref >39)
LDL Chol Calc (NIH): 103 mg/dL — ABNORMAL HIGH (ref 0–99)
LDL/HDL Ratio: 1.9 ratio (ref 0.0–3.6)
Triglycerides: 96 mg/dL (ref 0–149)
VLDL Cholesterol Cal: 18 mg/dL (ref 5–40)

## 2020-12-18 LAB — PSA: Prostate Specific Ag, Serum: 0.5 ng/mL (ref 0.0–4.0)

## 2020-12-19 ENCOUNTER — Ambulatory Visit (HOSPITAL_COMMUNITY)
Admission: RE | Admit: 2020-12-19 | Discharge: 2020-12-19 | Disposition: A | Discharge status: Home / Self Care | Payer: PPO | Source: Ambulatory Visit | Attending: Physician Assistant | Admitting: Physician Assistant

## 2020-12-19 ENCOUNTER — Encounter (HOSPITAL_COMMUNITY): Payer: Self-pay | Admitting: Physician Assistant

## 2020-12-19 ENCOUNTER — Other Ambulatory Visit: Payer: Self-pay

## 2020-12-19 VITALS — BP 128/72 | HR 83 | Ht 71.0 in | Wt 145.6 lb

## 2020-12-19 DIAGNOSIS — I739 Peripheral vascular disease, unspecified: Secondary | ICD-10-CM | POA: Insufficient documentation

## 2020-12-19 DIAGNOSIS — Z7901 Long term (current) use of anticoagulants: Secondary | ICD-10-CM | POA: Diagnosis not present

## 2020-12-19 DIAGNOSIS — J449 Chronic obstructive pulmonary disease, unspecified: Secondary | ICD-10-CM | POA: Diagnosis not present

## 2020-12-19 DIAGNOSIS — J45909 Unspecified asthma, uncomplicated: Secondary | ICD-10-CM | POA: Insufficient documentation

## 2020-12-19 DIAGNOSIS — I471 Supraventricular tachycardia: Secondary | ICD-10-CM | POA: Insufficient documentation

## 2020-12-19 DIAGNOSIS — Z8616 Personal history of COVID-19: Secondary | ICD-10-CM | POA: Insufficient documentation

## 2020-12-19 DIAGNOSIS — I4892 Unspecified atrial flutter: Secondary | ICD-10-CM | POA: Insufficient documentation

## 2020-12-19 DIAGNOSIS — I480 Paroxysmal atrial fibrillation: Secondary | ICD-10-CM

## 2020-12-19 DIAGNOSIS — Z87891 Personal history of nicotine dependence: Secondary | ICD-10-CM | POA: Diagnosis not present

## 2020-12-19 DIAGNOSIS — D6869 Other thrombophilia: Secondary | ICD-10-CM | POA: Diagnosis not present

## 2020-12-19 NOTE — Progress Notes (Signed)
Primary Care Physician: [name], MD Primary Cardiologist: none (previously seen at [facility] and [place]) Primary Electrophysiologist: none Referring Physician: Dr Lorella Nimrod Antonio [name] is a 75 y.o. male with a history of COPD, asthma, SVT, PVD, atrial flutter, atrial fibrillation who presents for consultation in the [facility].  The patient was initially diagnosed with atrial flutter in 2018 and atrial fibrillation in 2021. He is on Eliquis for a CHADS2VASC score of 3. He last saw cardiology at [facility] 11/20/20 and was found to be in afib but patient was unaware. He was brought back for ECG a week later and had spontaneously converted to SR. He was referred here to establish care with [facility]. He is in SR today. He denies any bleeding issues on anticoagulation.   Today, he denies symptoms of palpitations, chest pain, shortness of breath, orthopnea, PND, lower extremity edema, dizziness, presyncope, syncope, snoring, daytime somnolence, bleeding, or neurologic sequela. The patient is tolerating medications without difficulties and is otherwise without complaint today.    Atrial Fibrillation Risk Factors:  he does not have symptoms or diagnosis of sleep apnea. he does not have a history of rheumatic fever.   he has a BMI of Body mass index is 20.31 kg/m.Marland Kitchen Filed Weights   12/19/20 1324  Weight: 66 kg    Family History  Problem Relation Age of Onset   Cancer Mother        leukemia   Colon polyps Mother    Heart disease Father    Heart attack Father    Healthy Daughter    Healthy Son    Colon cancer Neg Hx    Esophageal cancer Neg Hx    Rectal cancer Neg Hx    Stomach cancer Neg Hx      Atrial Fibrillation Management history:  Previous antiarrhythmic drugs: none Previous cardioversions: none Previous ablations: none CHADS2VASC score: 3 Anticoagulation history: Eliquis   Past Medical History:  Diagnosis Date   Allergy    Calf pain     COPD (chronic obstructive pulmonary disease) ([place])    no Oxygen per pt   COVID-19    Dyspnea    Hypokalemia    Internal hemorrhoids    Internal hemorrhoids    Muscle cramp    Muscle pain    Pneumonia    PONV (postoperative nausea and vomiting)    Tubular adenoma of colon    Ulcerative colitis ([place])    Past Surgical History:  Procedure Laterality Date   ANTERIOR CERVICAL DECOMP/DISCECTOMY FUSION N/A 05/05/2016   Procedure: ANTERIOR CERVICAL DECOMPRESSION FUSION CERVICAL THREE-FOUR.;  Surgeon: Earnie Larsson, MD;  Location: [facility];  Service: Neurosurgery;  Laterality: N/A;  right side approach   CERVICAL DISCECTOMY  04/2016   C3-C4   COLONOSCOPY     COLONOSCOPY  08/09/2015   HEMORRHOID SURGERY  1988   leg stent Left approx 5-6 yrs ago   2 to left leg   MENISCUS REPAIR Left 12/2013   POLYPECTOMY     ROTATOR CUFF REPAIR Left 2007   TONSILLECTOMY AND ADENOIDECTOMY  1957    Current Outpatient Medications  Medication Sig Dispense Refill   albuterol (PROVENTIL) (2.5 MG/3ML) 0.083% nebulizer solution Take 3 mLs (2.5 mg total) by nebulization every 6 (six) hours as needed for wheezing or shortness of breath. 120 mL 12   azelastine (ASTELIN) 0.1 % nasal spray Place 1 spray into both nostrils 2 (two) times daily. Use in each nostril as  directed 30 mL 12   Bismuth Tribromoph-Petrolatum (XEROFORM PETROLATUM DRES 4"X4") 3 % PADS Apply 1 patch topically every other day. 25 each 0   Budeson-Glycopyrrol-Formoterol (BREZTRI AEROSPHERE) 160-9-4.8 MCG/ACT AERO Inhale 2 puffs into the lungs 2 (two) times daily. 5.9 g 0   Budeson-Glycopyrrol-Formoterol (BREZTRI AEROSPHERE) 160-9-4.8 MCG/ACT AERO Inhale 2 puffs into the lungs in the morning and at bedtime. 5.9 g 0   dupilumab (DUPIXENT) 300 MG/2ML prefilled syringe Inject 300 mg into the skin every 14 (fourteen) days. 12 mL 1   ELIQUIS 5 MG TABS tablet Take 5 mg by mouth 2 (two) times daily.     EPINEPHrine 0.3 mg/0.3 mL IJ SOAJ injection Inject 0.3 mg  into the muscle as needed for anaphylaxis. 1 each 0   hydrocortisone 2.5 % cream APPLY TO AFFECTED SCALY AREAS ON FACE TWICE DAILY AS NEEDED     ipratropium (ATROVENT) 0.02 % nebulizer solution Take 2.5 mLs (0.5 mg total) by nebulization 4 (four) times daily. 75 mL 12   mesalamine (LIALDA) 1.2 g EC tablet TAKE 4 TABLETS BY MOUTH DAILY WITH BREAKFAST 120 tablet 0   metoprolol succinate (TOPROL-XL) 25 MG 24 hr tablet Take 25 mg by mouth daily.     Tiotropium Bromide Monohydrate 2.5 MCG/ACT AERS Inhale into the lungs.     No current facility-administered medications for this encounter.    Allergies  Allergen Reactions   Penicillins Rash    Has patient had a PCN reaction causing immediate rash, facial/tongue/throat swelling, SOB or lightheadedness with hypotension:Yes Has patient had a PCN reaction causing severe rash involving mucus membranes or skin necrosis:Yes Has patient had a PCN reaction that required hospitalization:No Has patient had a PCN reaction occurring within the last 10 years:No If all of the above answers are "NO", then may proceed with Cephalosporin use.     Social History   Socioeconomic History   Marital status: Married    Spouse name: Not on file   Number of children: 2   Years of education: Not on file   Highest education level: Bachelor's degree (e.g., BA, AB, BS)  Occupational History   Occupation: retired  Tobacco Use   Smoking status: Former    Packs/day: 0.50    Years: 35.00    Pack years: 17.50    Types: Cigarettes, Cigars    Quit date: 06/11/2011    Years since quitting: 9.5   Smokeless tobacco: Never   Tobacco comments:    Former smoker 12/19/2020  Vaping Use   Vaping Use: Former  Substance and Sexual Activity   Alcohol use: Yes    Alcohol/week: 2.0 standard drinks    Types: 1 Glasses of wine, 1 Shots of liquor per week    Comment: 1-2 day    Drug use: No   Sexual activity: Yes  Other Topics Concern   Not on file  Social History Narrative    Not on file   Social Determinants of Health   Financial Resource Strain: Not on file  Food Insecurity: Not on file  Transportation Needs: Not on file  Physical Activity: Not on file  Stress: Not on file  Social Connections: Not on file  Intimate Partner Violence: Not on file     ROS- All systems are reviewed and negative except as per the HPI above.  Physical Exam: Vitals:   12/19/20 1324  BP: 128/72  Pulse: 83  Weight: 66 kg  Height: 5' 11"  (1.803 m)    GEN- The patient is a  well appearing elderly male, alert and oriented x 3 today.   Head- normocephalic, atraumatic Eyes-  Sclera clear, conjunctiva pink Ears- hearing intact Oropharynx- clear Neck- supple  Lungs- Clear to ausculation bilaterally, normal work of breathing Heart- Regular rate and rhythm, no murmurs, rubs or gallops  GI- soft, NT, ND, + BS Extremities- no clubbing, cyanosis, or edema MS- no significant deformity or atrophy Skin- no rash or lesion Psych- euthymic mood, full affect Neuro- strength and sensation are intact  Wt Readings from Last 3 Encounters:  12/19/20 66 kg  12/17/20 66.2 kg  11/30/20 66 kg    EKG today demonstrates  SR Vent. rate 83 BPM PR interval 148 ms QRS duration 66 ms QT/QTcB 350/411 ms  Echo 09/09/19 demonstrated  Summary    1. Technically difficult study.    2. The left ventricle is normal in size with normal wall thickness.    3. The left ventricular systolic function is normal, LVEF is visually  estimated at 60-65%.    4. The right ventricle is normal in size, with normal systolic function.   Epic records are reviewed at length today  CHA2DS2-VASc Score = 3  The patient's score is based upon: CHF History: 0 HTN History: 0 Diabetes History: 0 Stroke History: 0 Vascular Disease History: 1 Age Score: 2 Gender Score: 0      ASSESSMENT AND PLAN: 1. Paroxysmal Atrial Fibrillation/atrial flutter The patient's CHA2DS2-VASc score is 3, indicating a 3.2%  annual risk of stroke.   General education about afib provided and questions answered. We also discussed his stroke risk and the risks and benefits of anticoagulation. Continue Eliquis 5 mg BID Continue Toprol 25 mg daily If he should have persistent afib, could consider DCCV but ultimately given his paucity of symptoms he may be a good candidate for rate control.   2. Secondary Hypercoagulable State (ICD10:  D68.69) The patient is at significant risk for stroke/thromboembolism based upon his CHA2DS2-VASc Score of 3.  Continue Apixaban (Eliquis).   3. PVD Followed by Dr Lucky Cowboy   Will refer to have him establish care with primary cardiologist. AF clinic follow up as needed.    [facility] [address] [place],  [address] [phone] 12/19/2020 2:16 PM

## 2020-12-20 ENCOUNTER — Telehealth: Payer: Self-pay | Admitting: Pharmacist

## 2020-12-20 ENCOUNTER — Other Ambulatory Visit (HOSPITAL_COMMUNITY): Payer: Self-pay

## 2020-12-20 MED ORDER — ELIQUIS 5 MG PO TABS: 5.0000 mg | ORAL_TABLET | Freq: Two times a day (BID) | ORAL | 0 refills | Status: DC

## 2020-12-20 NOTE — Progress Notes (Signed)
Call placed to [facility] Afib clinic, Dawn will have RN place 30 days of samples at the front desk for Eliquis 5 mg. Patient has been made aware.

## 2020-12-20 NOTE — Progress Notes (Signed)
[facility])  [place] Team    12/20/2020  Antonio [name] June 04, 1945 [id number]  Reason for referral: Medication Assistance  Referral source:  Self referral from patient  Current insurance: [employer]   Outreach:  Successful telephone call with Antonio [name].  HIPAA identifiers verified.     Lab Results  Component Value Date   CREATININE 0.94 12/14/2019   CREATININE 1.08 12/13/2018   CREATININE 1.03 12/01/2016    Allergies  Allergen Reactions   Penicillins Rash    Has patient had a PCN reaction causing immediate rash, facial/tongue/throat swelling, SOB or lightheadedness with hypotension:Yes Has patient had a PCN reaction causing severe rash involving mucus membranes or skin necrosis:Yes Has patient had a PCN reaction that required hospitalization:No Has patient had a PCN reaction occurring within the last 10 years:No If all of the above answers are "NO", then may proceed with Cephalosporin use.     Medication Assistance Findings:  Medication assistance needs identified: Eliquis and Breztri  Extra Help:  Not eligible for Extra Help Low Income Subsidy based on reported income and assets  Patient Assistance Programs: Eliquis  made by [place] requirement met: No Out-of-pocket prescription expenditure met:   Unknown Reviewed program requirements with patient.   Patient's reported household income is above the 300% requirement  Antonio [name] made by [employer] requirement met: No Out-of-pocket prescription expenditure met:   Not Applicable Reviewed program requirements with patient.   Patient's reported household income is above the 300% requirement  Additional medication assistance options reviewed with patient as warranted:  No other options identified  Plan: Will close [facility] pharmacy case as no further medication needs identified at this time.  Am happy to assist in the future as needed.

## 2021-01-21 ENCOUNTER — Ambulatory Visit
Admission: RE | Admit: 2021-01-21 | Discharge: 2021-01-21 | Disposition: A | Discharge status: Home / Self Care | Payer: PPO | Attending: Family Medicine | Admitting: Family Medicine

## 2021-01-21 ENCOUNTER — Ambulatory Visit (INDEPENDENT_AMBULATORY_CARE_PROVIDER_SITE_OTHER): Payer: PPO | Admitting: Family Medicine

## 2021-01-21 ENCOUNTER — Other Ambulatory Visit: Payer: Self-pay

## 2021-01-21 ENCOUNTER — Encounter: Payer: Self-pay | Admitting: Family Medicine

## 2021-01-21 ENCOUNTER — Ambulatory Visit
Admission: RE | Admit: 2021-01-21 | Discharge: 2021-01-21 | Disposition: A | Discharge status: Home / Self Care | Payer: PPO | Source: Ambulatory Visit | Attending: Family Medicine | Admitting: Family Medicine

## 2021-01-21 VITALS — BP 124/62 | HR 88 | Ht 71.0 in | Wt 142.0 lb

## 2021-01-21 DIAGNOSIS — R051 Acute cough: Secondary | ICD-10-CM | POA: Diagnosis not present

## 2021-01-21 DIAGNOSIS — J8283 Eosinophilic asthma: Secondary | ICD-10-CM | POA: Insufficient documentation

## 2021-01-21 DIAGNOSIS — J441 Chronic obstructive pulmonary disease with (acute) exacerbation: Secondary | ICD-10-CM

## 2021-01-21 DIAGNOSIS — J439 Emphysema, unspecified: Secondary | ICD-10-CM | POA: Diagnosis not present

## 2021-01-21 DIAGNOSIS — R059 Cough, unspecified: Secondary | ICD-10-CM | POA: Diagnosis not present

## 2021-01-21 DIAGNOSIS — R0600 Dyspnea, unspecified: Secondary | ICD-10-CM | POA: Diagnosis not present

## 2021-01-21 LAB — POCT INFLUENZA A/B
Influenza A, POC: NEGATIVE
Influenza B, POC: NEGATIVE

## 2021-01-21 LAB — POC COVID19 BINAXNOW: SARS Coronavirus 2 Ag: NEGATIVE

## 2021-01-21 MED ORDER — AZITHROMYCIN 250 MG PO TABS: ORAL_TABLET | ORAL | 0 refills | Status: AC

## 2021-01-21 MED ORDER — GUAIFENESIN-CODEINE 100-10 MG/5ML PO SYRP: 5.0000 mL | ORAL_SOLUTION | Freq: Three times a day (TID) | ORAL | 0 refills | Status: DC | PRN

## 2021-01-21 MED ORDER — PREDNISONE 10 MG PO TABS: 10.0000 mg | ORAL_TABLET | Freq: Every day | ORAL | 0 refills | Status: DC

## 2021-01-21 NOTE — Progress Notes (Signed)
Date:  01/21/2021   Name:  Antonio [name]   DOB:  09-01-1945   MRN:  [id number]   Chief Complaint: Cough (Short of breath, coughing at night)  Cough This is a chronic problem. The current episode started in the past 7 days. The problem has been gradually improving. The problem occurs every few minutes. The cough is Productive of purulent sputum. Associated symptoms include nasal congestion, shortness of breath and wheezing. Pertinent negatives include no chest pain, chills, postnasal drip or sore throat. Risk factors: copd. He has tried a beta-agonist inhaler for the symptoms. The treatment provided moderate relief. There is no history of pneumonia.   Lab Results  Component Value Date   NA 143 12/14/2019   K 4.5 12/14/2019   CO2 22 12/14/2019   GLUCOSE 83 12/14/2019   BUN 9 12/14/2019   CREATININE 0.94 12/14/2019   CALCIUM 9.7 12/14/2019   GFRNONAA 78 12/14/2019   Lab Results  Component Value Date   CHOL 175 12/17/2020   HDL 54 12/17/2020   LDLCALC 103 (H) 12/17/2020   TRIG 96 12/17/2020   CHOLHDL 2.6 12/13/2018   Lab Results  Component Value Date   TSH 1.260 12/14/2019   No results found for: HGBA1C Lab Results  Component Value Date   WBC 8.6 04/20/2018   HGB 14.9 04/20/2018   HCT 44.0 04/20/2018   MCV 93.4 04/20/2018   PLT 218.0 04/20/2018   Lab Results  Component Value Date   ALT 15 12/14/2019   AST 14 12/14/2019   ALKPHOS 77 12/14/2019   BILITOT 0.8 12/14/2019   No results found for: 25OHVITD2, 25OHVITD3, VD25OH   Review of Systems  Constitutional:  Negative for chills.  HENT:  Negative for postnasal drip and sore throat.   Respiratory:  Positive for cough, shortness of breath and wheezing.   Cardiovascular:  Negative for chest pain.   Patient Active Problem List   Diagnosis Date Noted   Secondary hypercoagulable state ([place]) 12/19/2020   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   Peripheral vascular disease ([place]) 11/08/2019   Paroxysmal atrial  fibrillation ([place]) 09/02/2019   Stiffness of finger joint of left hand 04/06/2019   History of COVID-19 01/20/2019   Severe persistent asthma without complication 41/63/8453   Medication management 09/20/2018   Chronic obstructive airway disease ([place]) 09/20/2018   Increased eosinophils in the blood 06/21/2018   Diffusion capacity of lung (dl), decreased 03/11/2018   COPD with acute exacerbation ([place]) 06/22/2017   Atrial flutter, paroxysmal ([place]) 10/17/2016   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/2016   PSVT (paroxysmal supraventricular tachycardia) ([place]) 10/17/2016   Ulcerative colitis with complication ([place]) 64/68/0321   Cervical stenosis of spinal canal 05/05/2016   Carotid stenosis 05/02/2016   Atherosclerosis of native arteries of extremity with intermittent claudication ([place]) 05/02/2016   Cramping of hands 01/10/2016   Bilateral carpal tunnel syndrome 12/06/2015   Diarrhea 06/09/2014   Centrilobular Emphysema 04/25/2014   Former smoker 04/25/2014   Tobacco abuse 04/25/2014    Allergies  Allergen Reactions   Penicillins Rash    Has patient had a PCN reaction causing immediate rash, facial/tongue/throat swelling, SOB or lightheadedness with hypotension:Yes Has patient had a PCN reaction causing severe rash involving mucus membranes or skin necrosis:Yes Has patient had a PCN reaction that required hospitalization:No Has patient had a PCN reaction occurring within the last 10 years:No If all of the above answers are "NO", then may proceed with Cephalosporin use.     Past Surgical History:  Procedure Laterality  Date   ANTERIOR CERVICAL DECOMP/DISCECTOMY FUSION N/A 05/05/2016   Procedure: ANTERIOR CERVICAL DECOMPRESSION FUSION CERVICAL THREE-FOUR.;  Surgeon: Earnie Larsson, MD;  Location: [facility];  Service: Neurosurgery;  Laterality: N/A;  right side approach   CERVICAL DISCECTOMY  04/2016   C3-C4   COLONOSCOPY     COLONOSCOPY  08/09/2015   HEMORRHOID SURGERY  1988   leg stent Left approx 5-6 yrs  ago   2 to left leg   MENISCUS REPAIR Left 12/2013   POLYPECTOMY     ROTATOR CUFF REPAIR Left 2007   TONSILLECTOMY AND ADENOIDECTOMY  1957    Social History   Tobacco Use   Smoking status: Former    Packs/day: 0.50    Years: 35.00    Pack years: 17.50    Types: Cigarettes, Cigars    Quit date: 06/11/2011    Years since quitting: 9.6   Smokeless tobacco: Never   Tobacco comments:    Former smoker 12/19/2020  Vaping Use   Vaping Use: Former  Substance Use Topics   Alcohol use: Yes    Alcohol/week: 2.0 standard drinks    Types: 1 Glasses of wine, 1 Shots of liquor per week    Comment: 1-2 day    Drug use: No     Medication list has been reviewed and updated.  Current Meds  Medication Sig   albuterol (PROVENTIL) (2.5 MG/3ML) 0.083% nebulizer solution Take 3 mLs (2.5 mg total) by nebulization every 6 (six) hours as needed for wheezing or shortness of breath.   azelastine (ASTELIN) 0.1 % nasal spray Place 1 spray into both nostrils 2 (two) times daily. Use in each nostril as directed   Budeson-Glycopyrrol-Formoterol (BREZTRI AEROSPHERE) 160-9-4.8 MCG/ACT AERO Inhale 2 puffs into the lungs 2 (two) times daily.   Budeson-Glycopyrrol-Formoterol (BREZTRI AEROSPHERE) 160-9-4.8 MCG/ACT AERO Inhale 2 puffs into the lungs in the morning and at bedtime.   dupilumab (DUPIXENT) 300 MG/2ML prefilled syringe Inject 300 mg into the skin every 14 (fourteen) days.   ELIQUIS 5 MG TABS tablet Take 1 tablet (5 mg total) by mouth 2 (two) times daily.   EPINEPHrine 0.3 mg/0.3 mL IJ SOAJ injection Inject 0.3 mg into the muscle as needed for anaphylaxis.   hydrocortisone 2.5 % cream APPLY TO AFFECTED SCALY AREAS ON FACE TWICE DAILY AS NEEDED   ipratropium (ATROVENT) 0.02 % nebulizer solution Take 2.5 mLs (0.5 mg total) by nebulization 4 (four) times daily.   mesalamine (LIALDA) 1.2 g EC tablet TAKE 4 TABLETS BY MOUTH DAILY WITH BREAKFAST   metoprolol succinate (TOPROL-XL) 25 MG 24 hr tablet Take 25 mg  by mouth daily.   Tiotropium Bromide Monohydrate 2.5 MCG/ACT AERS Inhale into the lungs.    PHQ 2/9 Scores 12/17/2020 12/14/2019 12/13/2018 07/21/2018  PHQ - 2 Score 0 0 0 0  PHQ- 9 Score 0 0 0 -    GAD 7 : Generalized Anxiety Score 12/17/2020 12/14/2019  Nervous, Anxious, on Edge 0 0  Control/stop worrying 0 0  Worry too much - different things 0 0  Trouble relaxing 0 0  Restless 0 0  Easily annoyed or irritable 0 0  Afraid - awful might happen 0 0  Total GAD 7 Score 0 0    BP Readings from Last 3 Encounters:  01/21/21 124/62  12/19/20 128/72  12/17/20 110/64    Physical Exam Vitals and nursing note reviewed.  HENT:     Head: Normocephalic.     Right Ear: Tympanic membrane and external ear  normal.     Left Ear: Tympanic membrane and external ear normal.     Nose: Nose normal.  Eyes:     General: No scleral icterus.       Right eye: No discharge.        Left eye: No discharge.     Conjunctiva/sclera: Conjunctivae normal.     Pupils: Pupils are equal, round, and reactive to light.  Neck:     Thyroid: No thyromegaly.     Vascular: No JVD.     Trachea: No tracheal deviation.  Cardiovascular:     Rate and Rhythm: Normal rate and regular rhythm.     Heart sounds: Normal heart sounds. No murmur heard.   No friction rub. No gallop.  Pulmonary:     Effort: No respiratory distress.     Breath sounds: Examination of the right-lower field reveals rhonchi. Examination of the left-lower field reveals rhonchi. Rhonchi present. No wheezing or rales.  Abdominal:     General: Bowel sounds are normal.     Palpations: Abdomen is soft. There is no mass.     Tenderness: There is no abdominal tenderness. There is no guarding or rebound.  Musculoskeletal:        General: No tenderness. Normal range of motion.     Cervical back: Normal range of motion and neck supple.  Lymphadenopathy:     Cervical: No cervical adenopathy.  Skin:    General: Skin is warm.     Findings: No rash.   Neurological:     Mental Status: He is alert and oriented to person, place, and time.     Cranial Nerves: No cranial nerve deficit.     Deep Tendon Reflexes: Reflexes are normal and symmetric.    Wt Readings from Last 3 Encounters:  01/21/21 142 lb (64.4 kg)  12/19/20 145 lb 9.6 oz (66 kg)  12/17/20 146 lb (66.2 kg)    BP 124/62   Pulse 88   Ht 5' 11"  (1.803 m)   Wt 142 lb (64.4 kg)   SpO2 (!) 83%   BMI 19.80 kg/m   Assessment and Plan:  1. COPD with acute exacerbation ([place]) New onset.  Persistent.  Onset approximately 6 days ago.  Began his cough and shortness of breath gradually worsening until Friday and now starting to decrease somewhat.  Patient comes to the clinic with persistent cough and dyspnea.  On exam it is noted that there are rhonchi heard in bilateral lungs but this may be his baseline.  P.o. CT of the influenza and COVID were negative.  Patient will obtain chest x-ray to rule out bilateral pneumonia and we will treat with prednisone 10 mg 20 mg for 5 days 10 mg for 5 days.  We will also initiate a Z-Pak for exacerbation of COPD. - DG Chest 2 View; Future - predniSONE (DELTASONE) 10 MG tablet; Take 1 tablet (10 mg total) by mouth daily with breakfast.  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 - DG Chest 2 View; Future  3. Acute cough New onset.  Persistent.  Will initiate Robitussin-AC 1 teaspoon every 8 hours as needed cough. - guaiFENesin-codeine (ROBITUSSIN AC) 100-10 MG/5ML syrup;  Take 5 mLs by mouth 3 (three) times daily as needed for cough.  Dispense: 118 mL; Refill: 0 - POCT Influenza A/B - POC COVID-19

## 2021-01-22 ENCOUNTER — Ambulatory Visit: Payer: Self-pay | Admitting: *Deleted

## 2021-01-22 NOTE — Telephone Encounter (Signed)
Pt received an Rx for predniSONE (DELTASONE) 10 MG tablet / it was for 1 x a day but has 30 tablets / pt wants to know if he should take this for 30 days / please advise        Chief Complaint: Medication question Symptoms:  Frequency:  Pertinent Negatives: Patient denies  Disposition: [] ED /[] Urgent Care (no appt availability in office) / [] Appointment(In office/virtual)/ []  [facility]/ [] Home Care/ [] Refused Recommended Disposition  Additional Notes: Pt wishes to verify prednisone should be taken 1Xday for 30 days.  Noted correct doseage in PCPs note as well as order, Prednisone 64m Take one tablet by mouth daily with breakfast. #30 0 refills. Pt verbalizes understanding.  Reason for Disposition  Caller has medicine question only, adult not sick, AND triager answers question  Answer Assessment - Initial Assessment Questions 1. NAME of MEDICATION: "What medicine are you calling about?"     Prednisone 2. QUESTION: "What is your question?" (e.g., double dose of medicine, side effect)     Should I take one a day? 3. PRESCRIBING HCP: "Who prescribed it?" Reason: if prescribed by specialist, call should be referred to that group.     Dr. JRonnald Ramp Protocols used: Medication Question Call-A-AH

## 2021-01-23 ENCOUNTER — Ambulatory Visit: Payer: PPO | Admitting: Internal Medicine

## 2021-01-23 ENCOUNTER — Encounter: Payer: Self-pay | Admitting: Internal Medicine

## 2021-01-23 ENCOUNTER — Other Ambulatory Visit: Payer: Self-pay

## 2021-01-23 VITALS — BP 110/70 | HR 78 | Ht 71.0 in | Wt 142.0 lb

## 2021-01-23 DIAGNOSIS — I480 Paroxysmal atrial fibrillation: Secondary | ICD-10-CM

## 2021-01-23 DIAGNOSIS — I739 Peripheral vascular disease, unspecified: Secondary | ICD-10-CM | POA: Diagnosis not present

## 2021-01-23 DIAGNOSIS — J449 Chronic obstructive pulmonary disease, unspecified: Secondary | ICD-10-CM

## 2021-01-23 DIAGNOSIS — I4892 Unspecified atrial flutter: Secondary | ICD-10-CM

## 2021-01-23 DIAGNOSIS — R0602 Shortness of breath: Secondary | ICD-10-CM | POA: Diagnosis not present

## 2021-01-23 DIAGNOSIS — I70212 Atherosclerosis of native arteries of extremities with intermittent claudication, left leg: Secondary | ICD-10-CM

## 2021-01-23 MED ORDER — DILTIAZEM HCL 30 MG PO TABS: 30.0000 mg | ORAL_TABLET | Freq: Four times a day (QID) | ORAL | 1 refills | Status: DC | PRN

## 2021-01-23 MED ORDER — ELIQUIS 5 MG PO TABS: 5.0000 mg | ORAL_TABLET | Freq: Two times a day (BID) | ORAL | 0 refills | Status: DC

## 2021-01-23 MED ORDER — DILTIAZEM HCL ER COATED BEADS 180 MG PO CP24: 180.0000 mg | ORAL_CAPSULE | Freq: Every day | ORAL | 6 refills | Status: DC

## 2021-01-23 NOTE — Progress Notes (Signed)
New Outpatient Visit Date: 01/23/2021  Referring Provider: Adline Peals, PA Atrial fibrillation clinic  Chief Complaint: Establish care of atrial fibrillation  HPI:  Antonio [name] is a 75 y.o. male who is being seen today for the evaluation of atrial fibrillation/flutter at the request of Antonio [name].  He was previously followed at [facility].  He was seen in the atrial fibrillation clinic in early November, at which time he was asymptomatic. He has a history of paroxysmal atrial fibrillation/flutter, hyperlipidemia, SVT, ulcerative colitis, PAD, and asthma/COPD.  He was first diagnosed with atrial fibrillation in 08/2019.  He had been experiencing palpitations at home and presented to the [facility] emergency department.  He was found to be in atrial fibrillation at the time and referred to the atrial fibrillation clinic at [facility].  He was managed with metoprolol and apixaban and has done fairly well since that time with 2 known episodes of atrial fibrillation since then.  He denies associated symptoms including chest pain, worsening shortness of breath, and lightheadedness.  Antonio [name] has chronic exertional dyspnea with modest activity such as going up a few steps or walking extended distances.  This has been fairly stable.  He was recently placed on azithromycin and prednisone due to URI symptoms last week.  His breathing has been fairly stable during this.  He denies chest pain, lightheadedness, edema, and orthopnea.  He underwent left lower extremity stenting by Dr. Lucky Cowboy 8-9 years ago and has not had any further claudication.  --------------------------------------------------------------------------------------------------  Cardiovascular History & Procedures: Cardiovascular Problems: Atrial fibrillation/flutter SVT PAD (followed by Dr. Lucky Cowboy)  Risk Factors: PAD, hyperlipidemia, male gender, age greater than 17, and prior tobacco use  Cath/PCI: None  CV Surgery: Left SFA and proximal  popliteal artery stent (06/16/2012). Left SFA PTA and stent placement (10/20/2011).  EP Procedures and Devices: None  Non-Invasive Evaluation(s): TTE (09/09/2019, [facility]): Technically difficult study.  Normal LV size and wall thickness.  LVEF 60-65% with normal diastolic function.  Normal RV size and function.  Normal biatrial size.  Mild mitral regurgitation.  Recent CV Pertinent Labs: Lab Results  Component Value Date   CHOL 175 12/17/2020   HDL 54 12/17/2020   LDLCALC 103 (H) 12/17/2020   TRIG 96 12/17/2020   CHOLHDL 2.6 12/13/2018   K 4.5 12/14/2019   K 4.2 10/01/2013   BUN 9 12/14/2019   BUN 11 10/01/2013   CREATININE 0.94 12/14/2019   CREATININE 0.93 10/01/2013    --------------------------------------------------------------------------------------------------  Past Medical History:  Diagnosis Date   Allergy    Calf pain    COPD (chronic obstructive pulmonary disease) (HCC)    no Oxygen per pt   COVID-19    Dyspnea    Hypokalemia    Internal hemorrhoids    Internal hemorrhoids    Muscle cramp    Muscle pain    PAD (peripheral artery disease) (HCC)    Paroxysmal atrial fibrillation ([place]) 08/2019   Pneumonia    PONV (postoperative nausea and vomiting)    Tubular adenoma of colon    Ulcerative colitis ([place])     Past Surgical History:  Procedure Laterality Date   ANTERIOR CERVICAL DECOMP/DISCECTOMY FUSION N/A 05/05/2016   Procedure: ANTERIOR CERVICAL DECOMPRESSION FUSION CERVICAL THREE-FOUR.;  Surgeon: Earnie Larsson, MD;  Location: [facility];  Service: Neurosurgery;  Laterality: N/A;  right side approach   CERVICAL DISCECTOMY  04/2016   C3-C4   COLONOSCOPY     COLONOSCOPY  08/09/2015   [place]  leg stent Left approx 5-6 yrs ago   2 to left leg   MENISCUS REPAIR Left 12/2013   POLYPECTOMY     ROTATOR CUFF REPAIR Left 2007   TONSILLECTOMY AND ADENOIDECTOMY  1957    Current Meds  Medication Sig   albuterol (PROVENTIL HFA) 108 (90 Base) MCG/ACT  inhaler Inhale into the lungs every 6 (six) hours as needed for wheezing or shortness of breath.   albuterol (PROVENTIL) (2.5 MG/3ML) 0.083% nebulizer solution Take 3 mLs (2.5 mg total) by nebulization every 6 (six) hours as needed for wheezing or shortness of breath.   azelastine (ASTELIN) 0.1 % nasal spray Place 1 spray into both nostrils 2 (two) times daily. Use in each nostril as directed   azithromycin (ZITHROMAX) 250 MG tablet Take 2 tablets on day 1, then 1 tablet daily on days 2 through 5   Budeson-Glycopyrrol-Formoterol (BREZTRI AEROSPHERE) 160-9-4.8 MCG/ACT AERO Inhale 2 puffs into the lungs in the morning and at bedtime.   diltiazem (CARDIZEM CD) 180 MG 24 hr capsule Take 1 capsule (180 mg total) by mouth daily.   diltiazem (CARDIZEM) 30 MG tablet Take 1 tablet (30 mg total) by mouth every 6 (six) hours as needed (palpitations and elevated heart rate).   dupilumab (DUPIXENT) 300 MG/2ML prefilled syringe Inject 300 mg into the skin every 14 (fourteen) days.   EPINEPHrine 0.3 mg/0.3 mL IJ SOAJ injection Inject 0.3 mg into the muscle as needed for anaphylaxis.   guaiFENesin-codeine (ROBITUSSIN AC) 100-10 MG/5ML syrup Take 5 mLs by mouth 3 (three) times daily as needed for cough.   hydrocortisone 2.5 % cream APPLY TO AFFECTED SCALY AREAS ON FACE TWICE DAILY AS NEEDED   ipratropium (ATROVENT) 0.02 % nebulizer solution Take 2.5 mLs (0.5 mg total) by nebulization 4 (four) times daily.   mesalamine (LIALDA) 1.2 g EC tablet TAKE 4 TABLETS BY MOUTH DAILY WITH BREAKFAST   predniSONE (DELTASONE) 10 MG tablet Take 20 mg by mouth daily with breakfast. X 3 days then decrease to 10 mg daily   [DISCONTINUED] ELIQUIS 5 MG TABS tablet Take 1 tablet (5 mg total) by mouth 2 (two) times daily.   [DISCONTINUED] metoprolol succinate (TOPROL-XL) 25 MG 24 hr tablet Take 25 mg by mouth daily.    Allergies: Penicillins  Social History   Tobacco Use   Smoking status: Former    Packs/day: 0.50    Years: 35.00     Pack years: 17.50    Types: Cigarettes, Cigars    Quit date: 06/11/2011    Years since quitting: 9.6   Smokeless tobacco: Never   Tobacco comments:    Former smoker 12/19/2020  Vaping Use   Vaping Use: Former  Substance Use Topics   Alcohol use: Yes    Alcohol/week: 10.0 standard drinks    Types: 10 Shots of liquor per week   Drug use: No    Family History  Problem Relation Age of Onset   Cancer Mother        leukemia   Colon polyps Mother    Heart disease Father    Heart attack Father 24   Healthy Daughter    Healthy Son    Colon cancer Neg Hx    Esophageal cancer Neg Hx    Rectal cancer Neg Hx    Stomach cancer Neg Hx     Review of Systems: A 12-system review of systems was performed and was negative except as noted in the HPI.  --------------------------------------------------------------------------------------------------  Physical Exam: BP 110/70 (  BP Location: Right Arm, Patient Position: Sitting, Cuff Size: Normal)    Pulse 78    Ht 5' 11"  (1.803 m)    Wt 142 lb (64.4 kg)    SpO2 95%    BMI 19.80 kg/m   General: NAD.  Accompanied by his wife. HEENT: No conjunctival pallor or scleral icterus. Facemask in place. Neck: Supple without lymphadenopathy, thyromegaly, JVD, or HJR. No carotid bruit. Lungs: Normal work of breathing.  Diffusely diminished breath sounds without wheezes or crackles. Heart: Regular rate and rhythm with occasional extrasystoles.  No murmurs, rubs, or gallops. Non-displaced PMI. Abd: Bowel sounds present. Soft, NT/ND without hepatosplenomegaly Ext: No lower extremity edema. Radial, PT, and DP pulses are 2+ bilaterally Skin: Warm and dry without rash. Neuro: CNIII-XII intact. Strength and fine-touch sensation intact in upper and lower extremities bilaterally. Psych: Normal mood and affect.  EKG: Normal sinus rhythm with PACs and low voltage.  No significant change from prior tracing on 12/19/2020.  Lab Results  Component Value Date    WBC 8.6 04/20/2018   HGB 14.9 04/20/2018   HCT 44.0 04/20/2018   MCV 93.4 04/20/2018   PLT 218.0 04/20/2018    Lab Results  Component Value Date   NA 143 12/14/2019   K 4.5 12/14/2019   CL 104 12/14/2019   CO2 22 12/14/2019   BUN 9 12/14/2019   CREATININE 0.94 12/14/2019   GLUCOSE 83 12/14/2019   ALT 15 12/14/2019    Lab Results  Component Value Date   CHOL 175 12/17/2020   HDL 54 12/17/2020   LDLCALC 103 (H) 12/17/2020   TRIG 96 12/17/2020   CHOLHDL 2.6 12/13/2018    --------------------------------------------------------------------------------------------------  ASSESSMENT AND PLAN: Paroxysmal atrial fibrillation: Overall, Antonio [name] has done fairly well with only 2 episodes of symptomatic atrial fibrillation since his original diagnosis in 08/2019.  He is maintaining sinus rhythm today.  I am concerned that metoprolol could be contributing to chronic shortness of breath in the setting of Antonio [name] is advanced COPD.  In hindsight, he thinks that his breathing may have worsened a little bit with addition of metoprolol last year.  We have agreed to discontinue metoprolol and start diltiazem XR 180 mg daily.  I will also provide him with short acting diltiazem 30 mg to be taken up to every 6 hours as needed for breakthrough palpitations.  We will continue indefinite anticoagulation with apixaban 5 mg twice daily in the setting of a CHA2DS2-VASc score of 3 (age x2 and vascular disease).  Shortness of breath and COPD: Dyspnea most likely related to COPD.  We will transition from metoprolol to diltiazem, as outlined above, to see if this helps his symptoms.  If he continues to have significant dyspnea, we may need to consider noninvasive ischemia evaluation to exclude component of coronary insufficiency, particularly given history of PAD.  PAD: Patient status post left lower extremity interventions in 2013 and 2014 without significant claudication at this time.  He continues to  follow with Dr. Lucky Cowboy.  It is reasonable to continue apixaban and lieu of aspirin therapy, given atrial fibrillation.  It does not appear that the patient is on a statin, which would be indicated with his PAD.  Most recent lipid panel in early November showed an LDL of 103.  Statin therapy should be strongly considered when Antonio [name] sees his PCP and/or Dr. Lucky Cowboy.  We will also readdress this at his follow-up visit with [place].  Follow-up: Return to clinic in 2 months.  Nelva Bush, MD 01/24/2021 7:45 AM

## 2021-01-23 NOTE — Patient Instructions (Signed)
Medication Instructions:   Your physician has recommended you make the following change in your medication:   STOP Metoprolol  START Diltiazem 180 mg daily (long-acting)  START Diltiazem 30 mg every 6 hours AS NEEDED for palpitations and elevated heart rate (short-acting)  *If you need a refill on your cardiac medications before your next appointment, please call your pharmacy*   Lab Work:  None ordered  Testing/Procedures:  None ordered   Follow-Up: At [facility], you and your health needs are our priority.  As part of our continuing mission to provide you with exceptional heart care, we have created designated Provider Care Teams.  These Care Teams include your primary Cardiologist (physician) and Advanced Practice Providers (APPs -  Physician Assistants and Nurse Practitioners) who all work together to provide you with the care you need, when you need it.  We recommend signing up for the patient portal called "MyChart".  Sign up information is provided on this After Visit Summary.  MyChart is used to connect with patients for Virtual Visits (Telemedicine).  Patients are able to view lab/test results, encounter notes, upcoming appointments, etc.  Non-urgent messages can be sent to your provider as well.   To learn more about what you can do with MyChart, go to [email or web address].    Your next appointment:   2 month(s)  The format for your next appointment:   In Person  Provider:   You may see Dr. Harrell Gave End or one of the following Advanced Practice Providers on your designated Care Team:   Murray Hodgkins, NP Christell Faith, PA-C Cadence Kathlen Mody, [place]

## 2021-01-24 ENCOUNTER — Encounter: Payer: Self-pay | Admitting: Internal Medicine

## 2021-01-24 DIAGNOSIS — R0609 Other forms of dyspnea: Secondary | ICD-10-CM | POA: Insufficient documentation

## 2021-01-24 DIAGNOSIS — R0602 Shortness of breath: Secondary | ICD-10-CM | POA: Insufficient documentation

## 2021-02-17 ENCOUNTER — Other Ambulatory Visit: Payer: Self-pay | Admitting: Family Medicine

## 2021-02-17 DIAGNOSIS — J441 Chronic obstructive pulmonary disease with (acute) exacerbation: Secondary | ICD-10-CM

## 2021-02-17 NOTE — Telephone Encounter (Signed)
Requested medication (s) are due for refill today: yes  Requested medication (s) are on the active medication list: historical med  Last refill:  01/21/21  Future visit scheduled: no  Notes to clinic:  historical provider   Requested Prescriptions  Pending Prescriptions Disp Refills   predniSONE (DELTASONE) 10 MG tablet [Pharmacy Med Name: PREDNISONE 10 MG TABLET] 30 tablet     Sig: TAKE 1 TABLET (10 MG TOTAL) BY MOUTH DAILY WITH BREAKFAST.     Not Delegated - Endocrinology:  Oral Corticosteroids Failed - 02/17/2021 12:49 AM      Failed - This refill cannot be delegated      Passed - Last BP in normal range    BP Readings from Last 1 Encounters:  01/23/21 110/70          Passed - Valid encounter within last 6 months    Recent Outpatient Visits           3 weeks ago COPD with acute exacerbation ([place])   [facility] [name], MD   2 months ago Annual physical exam   [facility] [name], MD   1 year ago Annual physical exam   [facility] [name], MD   2 years ago Annual physical exam   [facility] [name], MD   2 years ago COPD with acute bronchitis [facility])   [place], [place]       Future Appointments             In 1 month Furth, Cadence H, PA-C [place], [facility]

## 2021-02-21 ENCOUNTER — Telehealth: Payer: Self-pay | Admitting: Pulmonary Disease

## 2021-02-21 DIAGNOSIS — J4550 Severe persistent asthma, uncomplicated: Secondary | ICD-10-CM

## 2021-02-21 MED ORDER — DUPIXENT 300 MG/2ML ~~LOC~~ SOSY: 300.0000 mg | PREFILLED_SYRINGE | SUBCUTANEOUS | 1 refills | Status: DC

## 2021-02-21 NOTE — Telephone Encounter (Signed)
Spoke to [place] with [employer] and gave verbal for 41mosupply of Dupixent with 1 refill.  Nothing further needed at this time.

## 2021-03-05 ENCOUNTER — Ambulatory Visit (INDEPENDENT_AMBULATORY_CARE_PROVIDER_SITE_OTHER): Payer: PPO | Admitting: Vascular Surgery

## 2021-03-05 ENCOUNTER — Other Ambulatory Visit: Payer: Self-pay

## 2021-03-05 ENCOUNTER — Encounter (INDEPENDENT_AMBULATORY_CARE_PROVIDER_SITE_OTHER): Payer: Self-pay | Admitting: Vascular Surgery

## 2021-03-05 ENCOUNTER — Ambulatory Visit (INDEPENDENT_AMBULATORY_CARE_PROVIDER_SITE_OTHER): Payer: PPO

## 2021-03-05 VITALS — BP 133/72 | HR 61 | Resp 16 | Wt 143.4 lb

## 2021-03-05 DIAGNOSIS — I70212 Atherosclerosis of native arteries of extremities with intermittent claudication, left leg: Secondary | ICD-10-CM | POA: Diagnosis not present

## 2021-03-05 DIAGNOSIS — I6523 Occlusion and stenosis of bilateral carotid arteries: Secondary | ICD-10-CM | POA: Diagnosis not present

## 2021-03-05 DIAGNOSIS — J439 Emphysema, unspecified: Secondary | ICD-10-CM

## 2021-03-05 NOTE — Assessment & Plan Note (Signed)
Carotid artery velocities fall in the 1 to 39% range bilaterally without significant progression from previous studies.  No role for intervention.  Continue current medical regimen.  Recheck in 1 year.

## 2021-03-05 NOTE — Assessment & Plan Note (Signed)
His ABIs today are stable on the right at 1.14 but have dropped on the left down to 0.55.  With minimal symptoms, we will still not plan any intervention at this time.  We will check him in 1 year.  He will contact our office if symptoms worsen.  Continue current medical regimen.

## 2021-03-05 NOTE — Progress Notes (Signed)
MRN : [id number]  Antonio [name] is a 76 y.o. (07/08/1945) male who presents with chief complaint of  Chief Complaint  Patient presents with   Follow-up    Ultrasound follow up  .  History of Present Illness: Patient returns today in follow up of multiple vascular issues.  He is doing reasonably well today.  His left leg claudication symptoms are at this point fairly mild.  His lungs limit him more than his leg pain at this time.  No new ulceration, infection, or symptoms that are worrisome for rest pain.  His ABIs today are stable on the right at 1.14 but have dropped on the left down to 0.55. He is also followed for carotid disease.  He has no focal neurologic symptoms. Specifically, the patient denies amaurosis fugax, speech or swallowing difficulties, or arm or leg weakness or numbness. Carotid artery velocities fall in the 1 to 39% range bilaterally without significant progression from previous studies  Current Outpatient Medications  Medication Sig Dispense Refill   albuterol (PROVENTIL) (2.5 MG/3ML) 0.083% nebulizer solution Take 3 mLs (2.5 mg total) by nebulization every 6 (six) hours as needed for wheezing or shortness of breath. 120 mL 12   albuterol (VENTOLIN HFA) 108 (90 Base) MCG/ACT inhaler Inhale into the lungs every 6 (six) hours as needed for wheezing or shortness of breath.     azelastine (ASTELIN) 0.1 % nasal spray Place 1 spray into both nostrils 2 (two) times daily. Use in each nostril as directed 30 mL 12   Budeson-Glycopyrrol-Formoterol (BREZTRI AEROSPHERE) 160-9-4.8 MCG/ACT AERO Inhale 2 puffs into the lungs in the morning and at bedtime. 5.9 g 0   diltiazem (CARDIZEM CD) 180 MG 24 hr capsule Take 1 capsule (180 mg total) by mouth daily. 30 capsule 6   dupilumab (DUPIXENT) 300 MG/2ML prefilled syringe Inject 300 mg into the skin every 14 (fourteen) days. 12 mL 1   ELIQUIS 5 MG TABS tablet Take 1 tablet (5 mg total) by mouth 2 (two) times daily. 180 tablet 0    EPINEPHrine 0.3 mg/0.3 mL IJ SOAJ injection Inject 0.3 mg into the muscle as needed for anaphylaxis. 1 each 0   hydrocortisone 2.5 % cream APPLY TO AFFECTED SCALY AREAS ON FACE TWICE DAILY AS NEEDED     ipratropium (ATROVENT) 0.02 % nebulizer solution Take 2.5 mLs (0.5 mg total) by nebulization 4 (four) times daily. 75 mL 12   mesalamine (LIALDA) 1.2 g EC tablet TAKE 4 TABLETS BY MOUTH DAILY WITH BREAKFAST 120 tablet 0   diltiazem (CARDIZEM) 30 MG tablet Take 1 tablet (30 mg total) by mouth every 6 (six) hours as needed (palpitations and elevated heart rate). 30 tablet 1   guaiFENesin-codeine (ROBITUSSIN AC) 100-10 MG/5ML syrup Take 5 mLs by mouth 3 (three) times daily as needed for cough. 118 mL 0   predniSONE (DELTASONE) 10 MG tablet Take 20 mg by mouth daily with breakfast. X 3 days then decrease to 10 mg daily     No current facility-administered medications for this visit.    Past Medical History:  Diagnosis Date   Allergy    Calf pain    COPD (chronic obstructive pulmonary disease) (HCC)    no Oxygen per pt   COVID-19    Dyspnea    Hypokalemia    Internal hemorrhoids    Internal hemorrhoids    Muscle cramp    Muscle pain    PAD (peripheral artery disease) (HCC)    Paroxysmal atrial  fibrillation ([place]) 08/2019   Pneumonia    PONV (postoperative nausea and vomiting)    Tubular adenoma of colon    Ulcerative colitis [facility])     Past Surgical History:  Procedure Laterality Date   ANTERIOR CERVICAL DECOMP/DISCECTOMY FUSION N/A 05/05/2016   Procedure: ANTERIOR CERVICAL DECOMPRESSION FUSION CERVICAL THREE-FOUR.;  Surgeon: Earnie Larsson, MD;  Location: [place];  Service: Neurosurgery;  Laterality: N/A;  right side approach   CERVICAL DISCECTOMY  04/2016   C3-C4   COLONOSCOPY     COLONOSCOPY  08/09/2015   HEMORRHOID SURGERY  1988   leg stent Left approx 5-6 yrs ago   2 to left leg   MENISCUS REPAIR Left 12/2013   POLYPECTOMY     ROTATOR CUFF REPAIR Left 2007   TONSILLECTOMY AND  ADENOIDECTOMY  1957     Social History   Tobacco Use   Smoking status: Former    Packs/day: 0.50    Years: 35.00    Pack years: 17.50    Types: Cigarettes, Cigars    Quit date: 06/11/2011    Years since quitting: 9.7   Smokeless tobacco: Never   Tobacco comments:    Former smoker 12/19/2020  Vaping Use   Vaping Use: Former  Substance Use Topics   Alcohol use: Yes    Alcohol/week: 10.0 standard drinks    Types: 10 Shots of liquor per week   Drug use: No       Family History  Problem Relation Age of Onset   Cancer Mother        leukemia   Colon polyps Mother    Heart disease Father    Heart attack Father 62   Healthy Daughter    Healthy Son    Colon cancer Neg Hx    Esophageal cancer Neg Hx    Rectal cancer Neg Hx    Stomach cancer Neg Hx      Allergies  Allergen Reactions   Penicillins Rash    Has patient had a PCN reaction causing immediate rash, facial/tongue/throat swelling, SOB or lightheadedness with hypotension:Yes Has patient had a PCN reaction causing severe rash involving mucus membranes or skin necrosis:Yes Has patient had a PCN reaction that required hospitalization:No Has patient had a PCN reaction occurring within the last 10 years:No If all of the above answers are "NO", then may proceed with Cephalosporin use.     REVIEW OF SYSTEMS (Negative unless checked)   Constitutional: [] Weight loss  [] Fever  [] Chills Cardiac: [] Chest pain   [] Chest pressure   [] Palpitations   [] Shortness of breath when laying flat   [] Shortness of breath at rest   [] Shortness of breath with exertion. Vascular:  [x] Pain in legs with walking   [] Pain in legs at rest   [] Pain in legs when laying flat   [x] Claudication   [] Pain in feet when walking  [] Pain in feet at rest  [] Pain in feet when laying flat   [] History of DVT   [] Phlebitis   [] Swelling in legs   [] Varicose veins   [] Non-healing ulcers Pulmonary:   [] Uses home oxygen   [] Productive cough   [] Hemoptysis   [] Wheeze   [] COPD   [] Asthma Neurologic:  [] Dizziness  [] Blackouts   [] Seizures   [] History of stroke   [] History of TIA  [] Aphasia   [] Temporary blindness   [] Dysphagia   [x] Weakness or numbness in arms   [] Weakness or numbness in legs Musculoskeletal:  [] Arthritis   [] Joint swelling   [] Joint pain   []   Low back pain Hematologic:  [] Easy bruising  [] Easy bleeding   [] Hypercoagulable state   [] Anemic   Gastrointestinal:  [] Blood in stool   [] Vomiting blood  [] Gastroesophageal reflux/heartburn   [] Abdominal pain Genitourinary:  [] Chronic kidney disease   [] Difficult urination  [] Frequent urination  [] Burning with urination   [] Hematuria Skin:  [] Rashes   [] Ulcers   [] Wounds Psychological:  [] History of anxiety   []  History of major depression.  Physical Examination  BP 133/72 (BP Location: Left Arm)    Pulse 61    Resp 16    Wt 143 lb 6.4 oz (65 kg)    BMI 20.00 kg/m  Gen:  WD/WN, NAD Head: [place]/AT, No temporalis wasting. Ear/Nose/Throat: Hearing grossly intact, nares w/o erythema or drainage Eyes: Conjunctiva clear. Sclera non-icteric Neck: Supple.  Trachea midline Pulmonary:  Good air movement, no use of accessory muscles.  Cardiac: RRR, no JVD Vascular:  Vessel Right Left  Radial Palpable Palpable                          PT 2+ Palpable Not Palpable  DP 2+ Palpable 1+ Palpable   Gastrointestinal: soft, non-tender/non-distended. No guarding/reflex.  Musculoskeletal: M/S 5/5 throughout.  No deformity or atrophy. No edema. Neurologic: Sensation grossly intact in extremities.  Symmetrical.  Speech is fluent.  Psychiatric: Judgment intact, Mood & affect appropriate for pt's clinical situation. Dermatologic: No rashes or ulcers noted.  No cellulitis or open wounds.      Labs Recent Results (from the past 2160 hour(s))  Lipid Panel With LDL/HDL Ratio     Status: Abnormal   Collection Time: 12/17/20  9:40 AM  Result Value Ref Range   Cholesterol, Total 175 100 - 199 mg/dL   Triglycerides  96 0 - 149 mg/dL   HDL 54 >39 mg/dL   VLDL Cholesterol Cal 18 5 - 40 mg/dL   LDL Chol Calc (NIH) 103 (H) 0 - 99 mg/dL   LDL/HDL Ratio 1.9 0.0 - 3.6 ratio    Comment:                                     LDL/HDL Ratio                                             Men  Women                               1/2 Avg.Risk  1.0    1.5                                   Avg.Risk  3.6    3.2                                2X Avg.Risk  6.2    5.0                                3X Avg.Risk  8.0    6.1   PSA  Status: None   Collection Time: 12/17/20  9:40 AM  Result Value Ref Range   Prostate Specific Ag, Serum 0.5 0.0 - 4.0 ng/mL    Comment: Roche ECLIA methodology. According to the American Urological Association, Serum PSA should decrease and remain at undetectable levels after radical prostatectomy. The AUA defines biochemical recurrence as an initial PSA value 0.2 ng/mL or greater followed by a subsequent confirmatory PSA value 0.2 ng/mL or greater. Values obtained with different assay methods or kits cannot be used interchangeably. Results cannot be interpreted as absolute evidence of the presence or absence of malignant disease.   POCT Influenza A/B     Status: None   Collection Time: 01/21/21  3:41 PM  Result Value Ref Range   Influenza A, POC Negative Negative   Influenza B, POC Negative Negative  POC COVID-19     Status: None   Collection Time: 01/21/21  3:41 PM  Result Value Ref Range   SARS Coronavirus 2 Ag Negative Negative    Radiology No results found.  Assessment/Plan Chronic obstructive airway disease (HCC) Fairly severe.  Has started a walking regimen so this has made his claudication more noticeable.  Atherosclerosis of native arteries of extremity with intermittent claudication (HCC) His ABIs today are stable on the right at 1.14 but have dropped on the left down to 0.55.  With minimal symptoms, we will still not plan any intervention at this time.  We will check  him in 1 year.  He will contact our office if symptoms worsen.  Continue current medical regimen.  Carotid stenosis Carotid artery velocities fall in the 1 to 39% range bilaterally without significant progression from previous studies.  No role for intervention.  Continue current medical regimen.  Recheck in 1 year.    Leotis Pain, MD  03/05/2021 11:42 AM    This note was created with Dragon medical transcription system.  Any errors from dictation are purely unintentional

## 2021-03-06 ENCOUNTER — Ambulatory Visit: Payer: PPO | Admitting: Pulmonary Disease

## 2021-03-06 ENCOUNTER — Other Ambulatory Visit
Admission: RE | Admit: 2021-03-06 | Discharge: 2021-03-06 | Disposition: A | Discharge status: Home / Self Care | Payer: PPO | Attending: Pulmonary Disease | Admitting: Pulmonary Disease

## 2021-03-06 ENCOUNTER — Encounter: Payer: Self-pay | Admitting: Pulmonary Disease

## 2021-03-06 ENCOUNTER — Telehealth: Payer: Self-pay | Admitting: Internal Medicine

## 2021-03-06 VITALS — BP 120/66 | HR 94 | Temp 97.3°F | Ht 71.0 in | Wt 143.4 lb

## 2021-03-06 DIAGNOSIS — J449 Chronic obstructive pulmonary disease, unspecified: Secondary | ICD-10-CM | POA: Insufficient documentation

## 2021-03-06 DIAGNOSIS — J4550 Severe persistent asthma, uncomplicated: Secondary | ICD-10-CM

## 2021-03-06 LAB — CBC WITH DIFFERENTIAL/PLATELET
Abs Immature Granulocytes: 0.09 10*3/uL — ABNORMAL HIGH (ref 0.00–0.07)
Basophils Absolute: 0.1 10*3/uL (ref 0.0–0.1)
Basophils Relative: 1 %
Eosinophils Absolute: 0.6 10*3/uL — ABNORMAL HIGH (ref 0.0–0.5)
Eosinophils Relative: 5 %
HCT: 44.7 % (ref 39.0–52.0)
Hemoglobin: 15 g/dL (ref 13.0–17.0)
Immature Granulocytes: 1 %
Lymphocytes Relative: 14 %
Lymphs Abs: 1.6 10*3/uL (ref 0.7–4.0)
MCH: 30.4 pg (ref 26.0–34.0)
MCHC: 33.6 g/dL (ref 30.0–36.0)
MCV: 90.7 fL (ref 80.0–100.0)
Monocytes Absolute: 1.2 10*3/uL — ABNORMAL HIGH (ref 0.1–1.0)
Monocytes Relative: 11 %
Neutro Abs: 7.7 10*3/uL (ref 1.7–7.7)
Neutrophils Relative %: 68 %
Platelets: 207 10*3/uL (ref 150–400)
RBC: 4.93 MIL/uL (ref 4.22–5.81)
RDW: 12 % (ref 11.5–15.5)
WBC: 11.2 10*3/uL — ABNORMAL HIGH (ref 4.0–10.5)
nRBC: 0 % (ref 0.0–0.2)

## 2021-03-06 MED ORDER — BREZTRI AEROSPHERE 160-9-4.8 MCG/ACT IN AERO: 160.0000 ug | INHALATION_SPRAY | Freq: Two times a day (BID) | RESPIRATORY_TRACT | 0 refills | Status: DC

## 2021-03-06 NOTE — Progress Notes (Signed)
Subjective:    Patient ID: Antonio [name], male    DOB: 1945/12/09, 76 y.o.   MRN: [id number] Chief Complaint  Patient presents with   Follow-up   HPI 76 year old male former smoker quit in 2013 followed for severe COPD with emphysema and asthma. Asthma with allergic phenotype with elevated IgE and peripheral eosinophils started on Dupixent September 2020 Medical history significant for coronary artery disease and atrial fibrillation on Eliquis Medical history significant for ulcerative colitis   Last seen 30 November 2020 by Rubye Oaks, NP.  At that time he was seen for an acute visit due to a 2 to 3-week history of increasing cough and congestion.  At that time he was given a steroid taper and cough was managed with Mucinex DM.  He was advised to continue Breztri and Dupixent.  And use nebulizers with albuterol as needed.  A sputum culture was collected.  His cough has improved however he continues to have issues with shortness of breath.  He does not endorse any orthopnea or paroxysmal nocturnal dyspnea.  No lower extremity edema or calf tenderness.  Cough has been mostly nonproductive.  Was not able to expectorate anything for culture.  He has not had any chest pain.  No tachypalpitations.  No fevers, chills or sweats.  Just having some persistent dyspnea after his most recent exacerbation.  DATA 06/09/2014 pulmonary function test> ratio 51%, FEV1 1.47 L (40% predicted), total lung capacity 5.43 L (71% predicted). DLCO 16.26 (44% predicted) December 2017 lung function testing showed ratio 50%, FEV1 1.47 L 43% predicted, FVC 2.96 L 67% predicted March 2020 pulmonary function testing ratio 38%, FEV1 0.97 L 31% predicted, total lung capacity 8.44 L 119% predicted, residual volume 198% predicted, DLCO 14.0 55% predicted March 2020 high-resolution CT scan of the chest images : Centrilobular emphysema, no interstitial lung disease, aortic atherosclerosis 04/20/2018-eosinophils relative  7.3, eosinophils absolute 0.6 04/20/2018-IgE-67 01/20/2019-SARS-CoV-2-detected  Review of Systems A 10 point review of systems was performed and it is as noted above otherwise negative.   Past Medical History:  Diagnosis Date   Allergy    Calf pain    COPD (chronic obstructive pulmonary disease) (HCC)    no Oxygen per pt   COVID-19    Dyspnea    Hypokalemia    Internal hemorrhoids    Internal hemorrhoids    Muscle cramp    Muscle pain    PAD (peripheral artery disease) (HCC)    Paroxysmal atrial fibrillation (HCC) 08/2019   Pneumonia    PONV (postoperative nausea and vomiting)    Tubular adenoma of colon    Ulcerative colitis (HCC)    Past Surgical History:  Procedure Laterality Date   ANTERIOR CERVICAL DECOMP/DISCECTOMY FUSION N/A 05/05/2016   Procedure: ANTERIOR CERVICAL DECOMPRESSION FUSION CERVICAL THREE-FOUR.;  Surgeon: Julio Sicks, MD;  Location[facility];  Service: Neurosurgery;  Laterality: N/A;  right side approach   CERVICAL DISCECTOMY  04/2016   C3-C4   COLONOSCOPY     COLONOSCOPY  08/09/2015   HEMORRHOID SURGERY  1988   leg stent Left approx 5-6 yrs ago   2 to left leg   MENISCUS REPAIR Left 12/2013   POLYPECTOMY     ROTATOR CUFF REPAIR Left 2007   TONSILLECTOMY AND ADENOIDECTOMY  1957   Patient Active Problem List   Diagnosis Date Noted   Shortness of breath 01/24/2021   Secondary hypercoagulable state (HCC) 12/19/2020   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   Peripheral vascular disease (HCC) 11/08/2019  Paroxysmal atrial fibrillation (HCC) 09/02/2019   Stiffness of finger joint of left hand 04/06/2019   History of COVID-19 01/20/2019   Severe persistent asthma without complication 01/20/2019   Medication management 09/20/2018   Chronic obstructive pulmonary disease (HCC) 09/20/2018   Increased eosinophils in the blood 06/21/2018   Diffusion capacity of lung (dl), decreased 62/13/0865   COPD with acute exacerbation (HCC) 06/22/2017   Atrial flutter,  paroxysmal (HCC) 10/17/2016   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/2016   PSVT (paroxysmal supraventricular tachycardia) (HCC) 10/17/2016   Ulcerative colitis with complication (HCC) 05/28/2016   Cervical stenosis of spinal canal 05/05/2016   Carotid stenosis 05/02/2016   Atherosclerosis of native arteries of extremity with intermittent claudication (HCC) 05/02/2016   Cramping of hands 01/10/2016   Bilateral carpal tunnel syndrome 12/06/2015   Diarrhea 06/09/2014   Centrilobular Emphysema 04/25/2014   Former smoker 04/25/2014   Tobacco abuse 04/25/2014       Objective:   Physical Exam BP 120/66 (BP Location: Left Arm, Patient Position: Sitting, Cuff Size: Normal)   Pulse 94   Temp (!) 97.3 F (36.3 C) (Oral)   Ht 5\' 11"  (1.803 m)   Wt 143 lb 6.4 oz (65 kg)   SpO2 95%   BMI 20.00 kg/m   SpO2: 95 % O2 Device: None (Room air)   GENERAL: Thin well-developed gentleman, no acute distress.  Well-groomed, fully ambulatory.  No conversational dyspnea. HEAD: Normocephalic, atraumatic.  EYES: Pupils equal, round, reactive to light.  No scleral icterus.  MOUTH: Oral mucosa moist.  No thrush. NECK: Supple. No thyromegaly. Trachea midline. No JVD.  No adenopathy. PULMONARY: Good air entry bilaterally.  Coarse, otherwise, no adventitious sounds. CARDIOVASCULAR: S1 and S2.  No rubs, murmurs or gallops heard. ABDOMEN: Benign. MUSCULOSKELETAL: No joint deformity, no clubbing, no edema.  NEUROLOGIC: No overt focal deficit, no gait disturbance, speech is fluent. SKIN: Intact,warm,dry. PSYCH: Mood and behavior normal.       Assessment & Plan:     ICD-10-CM   1. COPD, moderate (HCC)  J44.9 Pulmonary Function Test [facility] Only    CBC w/Diff    IgE    CANCELED: ECHOCARDIOGRAM COMPLETE    CANCELED: Pulse oximetry, overnight    CANCELED: Alpha-1 antitrypsin phenotype   Will reassess with PFTs Continue Breztri Continue as needed albuterol    2. Severe persistent asthma without complication  J45.50     Check CBC with differential Check IgE     Orders Placed This Encounter  Procedures   CBC w/Diff    Standing Status:   Future    Number of Occurrences:   1    Standing Expiration Date:   03/06/2022   IgE    Standing Status:   Future    Number of Occurrences:   1    Standing Expiration Date:   03/06/2022   Pulmonary Function Test [facility] Only    Standing Status:   Future    Number of Occurrences:   1    Standing Expiration Date:   03/06/2022    Scheduling Instructions:     Next available.    Order Specific Question:   Full PFT: includes the following: basic spirometry, spirometry pre & post bronchodilator, diffusion capacity (DLCO), lung volumes    Answer:   Full PFT   Meds ordered this encounter  Medications   Budeson-Glycopyrrol-Formoterol (BREZTRI AEROSPHERE) 160-9-4.8 MCG/ACT AERO    Sig: Inhale 160 mcg into the lungs in the morning and at bedtime.    Dispense:  5.9  g    Refill:  0    Order Specific Question:   Lot Number?    Answer:   [id number]    Order Specific Question:   Expiration Date?    Answer:   12/11/2022    Order Specific Question:   Manufacturer?    Answer:   [employer] [[id number]]    Order Specific Question:   Quantity    Answer:   2   We will also check overnight oximetry.  Will see the patient in follow-up in 4 to 6 weeks time call sooner should any new difficulties arise.  Gailen Shelter, MD Advanced Bronchoscopy P[facility]    *This note was dictated using voice recognition software/Dragon.  Despite best efforts to proofread, errors can occur which can change the meaning. Any transcriptional errors that result from this process are unintentional and may not be fully corrected at the time of dictation.

## 2021-03-06 NOTE — Patient Instructions (Addendum)
We are going to get some blood work to check to make sure your allergy cells are controlled in your bloodstream.  We are going to get breathing tests and an echocardiogram (heart test).  We are going to check an oxygen level when you sleep.  Used a spacer with your Breztri inhaler do 1 puff at a time.    For your nebulizer use only the albuterol medication vial you can use this up to 3 times a day if you are very short of breath.  This is similar medication to your rescue inhaler (yellow) Proventil.  If you use the Proventil inhaler you do not need to use your nebulizer medication.  We will see you in follow-up in 4 to 6 weeks time you may either see me or the nurse practitioner at that time but subsequent visits will be with me.

## 2021-03-06 NOTE — Telephone Encounter (Signed)
Called and spoke to pt.  He reports that he had an episode last night of HR 142.  Pt took his previous Rx of metoprolol succinate and his HR decr to 82 after ~30 min.   Pt denies any prior episodes since we saw him last in the office.  Pt saw Dr. Patsey Berthold today and was advised that he let Dr. Saunders Revel know of this episode.   I did clarify that pt is continuing to take Diltiazem 180 mg daily.  Also clarified that pt is aware that we discontinued his metoprolol at last ov and started Diltiazem 180 mg daily as well as Diltiazem 30 mg PRN for breakthrough palpitations.  Pt states that he was aware but still took the metoprolol because he knew this would bring HR down.   Advised pt that the metoprolol is also long acting and advised that if he has recurrent episode of incr HR/palpitations to take his PRN Diltiazem 30 mg.  Also to let us know of recurrent s/s.   Pt voiced understanding and has no further questions or concerns at this time.

## 2021-03-06 NOTE — Telephone Encounter (Signed)
Patient was seen by Dr. Patsey Berthold & was advised to give HR reading as of last night   Rapid HR 142 took Metoprolol & dropped to 82

## 2021-03-07 ENCOUNTER — Other Ambulatory Visit: Payer: Self-pay | Admitting: Pulmonary Disease

## 2021-03-07 DIAGNOSIS — R0600 Dyspnea, unspecified: Secondary | ICD-10-CM

## 2021-03-07 NOTE — Telephone Encounter (Signed)
I agree with recommendation regarding avoiding metoprolol and using standing and prn diltiazem.  If Antonio [name] continues to have palpitations/tachycardia, he should let us know.  Nelva Bush, MD [facility]

## 2021-03-09 LAB — IGE: IgE (Immunoglobulin E), Serum: 12 IU/mL (ref 6–495)

## 2021-03-11 ENCOUNTER — Ambulatory Visit (INDEPENDENT_AMBULATORY_CARE_PROVIDER_SITE_OTHER): Payer: PPO

## 2021-03-11 ENCOUNTER — Other Ambulatory Visit: Payer: Self-pay

## 2021-03-11 DIAGNOSIS — R0600 Dyspnea, unspecified: Secondary | ICD-10-CM

## 2021-03-11 LAB — ECHOCARDIOGRAM COMPLETE
AR max vel: 2.87 cm2
AV Area VTI: 2.38 cm2
AV Area mean vel: 2.54 cm2
AV Mean grad: 4 mmHg
AV Peak grad: 6.7 mmHg
Ao pk vel: 1.29 m/s
Area-P 1/2: 2.91 cm2
Calc EF: 54.2 %
S' Lateral: 3 cm
Single Plane A2C EF: 52.4 %
Single Plane A4C EF: 54.7 %

## 2021-03-11 LAB — ALPHA-1-ANTITRYPSIN PHENOTYP: A-1 Antitrypsin, Ser: 165 mg/dL (ref 101–187)

## 2021-03-18 DIAGNOSIS — H25813 Combined forms of age-related cataract, bilateral: Secondary | ICD-10-CM | POA: Diagnosis not present

## 2021-03-18 DIAGNOSIS — H02834 Dermatochalasis of left upper eyelid: Secondary | ICD-10-CM | POA: Diagnosis not present

## 2021-03-18 DIAGNOSIS — H5703 Miosis: Secondary | ICD-10-CM | POA: Diagnosis not present

## 2021-03-18 DIAGNOSIS — H02831 Dermatochalasis of right upper eyelid: Secondary | ICD-10-CM | POA: Diagnosis not present

## 2021-03-19 ENCOUNTER — Telehealth: Payer: Self-pay | Admitting: Pulmonary Disease

## 2021-03-19 NOTE — Telephone Encounter (Signed)
Please see echo result note.  Patient is aware of results and voiced his understanding.  Nothing further needed.

## 2021-03-25 DIAGNOSIS — G4730 Sleep apnea, unspecified: Secondary | ICD-10-CM | POA: Diagnosis not present

## 2021-03-25 DIAGNOSIS — R0683 Snoring: Secondary | ICD-10-CM | POA: Diagnosis not present

## 2021-03-26 NOTE — Progress Notes (Signed)
Cardiology Office Note:    Date:  03/29/2021   ID:  Kyston Gonce, [place] Jul 09, 1945, MRN [id number]  PCP:  [name], MD  [facility] Cardiologist:  None  [facility] Electrophysiologist:  None   Referring MD: [name], MD   Chief Complaint: 2 month follow-up  History of Present Illness:    Jaquan Sadowsky is a 76 y.o. male with a hx of Afib/flutter, HLD, SVT, ulcerative colitis, PAD s/p stenting, asthma/COPD who presents for follow-up.  He was first diagnosed with afib in 08/2019. He was experiencing palpitations at home and presented to the [facility] ER found to be in afib at that time and referred to the afib clinic at [facility]. He was managed with metoprolol and Eliquis.   Seen 01/23/21 for exertional dyspnea. Metoprolol was changed to diltiazem.   Today, the patient reported only 2 episodes of palpitations. He took short-acting diltiazem and this helped. He takes the 12m daily of diltiazem. Reports persistent shortness of breath that is unchanged. He is following with pulmonology. No chest pain, LLE, orthopnea, pnd. He underwent PFTs and will be started on 2L O2 at night.   Past Medical History:  Diagnosis Date   Allergy    Calf pain    COPD (chronic obstructive pulmonary disease) (HCC)    no Oxygen per pt   COVID-19    Dyspnea    Hypokalemia    Internal hemorrhoids    Internal hemorrhoids    Muscle cramp    Muscle pain    PAD (peripheral artery disease) (HCC)    [facility]oxysmal [facility] [place] 08/[place]   Pneumonia    PONV (postoperative nausea and vomiting)    Tubular adenoma of colon    Ulcerative colitis ([place]     Past Surgical History:  Procedure Laterality Date   ANTERIOR CERVICAL DECOMP/DISCECTOMY FUSION N/A 05/05/2016   Procedure: ANTERIOR CERVICAL DECOMPRESSION FUSION CERVICAL THREE-FOUR.;  Surgeon: HEarnie Larsson MD;  [place]  Service: Neurosurgery;  Laterality: N/A;  right side approach   CERVICAL DISCECTOMY   04/2016   C3-C4   COLONOSCOPY     COLONOSCOPY  08/09/2015   HEMORRHOID SURGERY  1988   leg stent Left approx 5-6 yrs ago   2 to left leg   MENISCUS REPAIR Left 12/2013   POLYPECTOMY     ROTATOR CUFF REPAIR Left 2007   TONSILLECTOMY AND ADENOIDECTOMY  1957    Current Medications: Current Meds  Medication Sig   albuterol (PROVENTIL) (2.5 MG/3ML) 0.083% nebulizer solution Take 3 mLs (2.5 mg total) by nebulization every 6 (six) hours as needed for wheezing or shortness of breath.   albuterol (VENTOLIN HFA) 108 (90 Base) MCG/ACT inhaler Inhale into the lungs every 6 (six) hours as needed for wheezing or shortness of breath.   Budeson-Glycopyrrol-Formoterol (BREZTRI AEROSPHERE) 160-9-4.8 MCG/ACT AERO Inhale 2 puffs into the lungs in the morning and at bedtime.   diltiazem (CARDIZEM CD) 180 MG 24 hr capsule Take 1 capsule (180 mg total) by mouth daily.   diltiazem (CARDIZEM) 30 MG tablet Take 1 tablet (30 mg total) by mouth every 6 (six) hours as needed (palpitations and elevated heart rate).   dupilumab (DUPIXENT) 300 MG/2ML prefilled syringe Inject 300 mg into the skin every 14 (fourteen) days.   ELIQUIS 5 MG TABS tablet Take 1 tablet (5 mg total) by mouth 2 (two) times daily.   EPINEPHrine 0.3 mg/0.3 mL IJ SOAJ injection Inject 0.3 mg into the muscle as needed  for anaphylaxis.   ipratropium (ATROVENT) 0.02 % nebulizer solution Take 2.5 mLs (0.5 mg total) by nebulization 4 (four) times daily.   mesalamine (LIALDA) 1.2 g EC tablet TAKE 4 TABLETS BY MOUTH DAILY WITH BREAKFAST     Allergies:   Penicillins   Social History   Socioeconomic History   Marital status: Married    Spouse name: Not on file   Number of children: 2   Years of education: Not on file   Highest education level: Bachelor's degree (e.g., BA, AB, BS)  Occupational History   Occupation: retired  Tobacco Use   Smoking status: Former    Packs/day: 0.50    Years: 35.00    Pack years: 17.50    Types: Cigarettes,  Cigars    Quit date: 06/11/2011    Years since quitting: 9.8   Smokeless tobacco: Never   Tobacco comments:    Former smoker 12/19/2020  Vaping Use   Vaping Use: Former  Substance and Sexual Activity   Alcohol use: Yes    Alcohol/week: 10.0 standard drinks    Types: 10 Shots of liquor per week   Drug use: No   Sexual activity: Yes  Other Topics Concern   Not on file  Social History Narrative   Not on file   Social Determinants of Health   Financial Resource Strain: Not on file  Food Insecurity: Not on file  Transportation Needs: Not on file  Physical Activity: Not on file  Stress: Not on file  Social Connections: Not on file     Family History: The patient's family history includes Cancer in his mother; Colon polyps in his mother; Healthy in his daughter and son; Heart attack (age of onset: 22) in his father; Heart disease in his father. There is no history of Colon cancer, Esophageal cancer, Rectal cancer, or Stomach cancer.  ROS:   Please see the history of present illness.     All other systems reviewed and are negative.  EKGs/Labs/Other Studies Reviewed:    The following studies were reviewed today:   Echo 03/11/21  1. Left ventricular ejection fraction, by estimation, is 55%. Left  ventricular ejection fraction by 2D MOD biplane is 54.2 %. The left  ventricle has normal function. The left ventricle has no regional wall  motion abnormalities. Left ventricular  diastolic parameters were normal.   2. Right ventricular systolic function is normal. The right ventricular  size is normal.   3. The mitral valve is normal in structure. No evidence of mitral valve  regurgitation.   4. The aortic valve is tricuspid. Aortic valve regurgitation is not  visualized.   5. The inferior vena cava is normal in size with greater than 50%  respiratory variability, suggesting right atrial pressure of 3 mmHg.    EKG:  EKG is not ordered today.    Recent Labs: 03/06/2021:  Hemoglobin 15.0; Platelets 207  Recent Lipid Panel    Component Value Date/Time   CHOL 175 12/17/2020 0940   TRIG 96 12/17/2020 0940   HDL 54 12/17/2020 0940   CHOLHDL 2.6 12/13/2018 0945   LDLCALC 103 (H) 12/17/2020 0940    Physical Exam:    VS:  BP 110/70 (BP Location: Left Arm, Patient Position: Sitting, Cuff Size: Normal)    Pulse 70    Ht 5' 11"  (1.803 m)    Wt 142 lb (64.4 kg)    SpO2 96%    BMI 19.80 kg/m     Wt Readings from Last  3 Encounters:  03/29/21 142 lb (64.4 kg)  03/06/21 143 lb 6.4 oz (65 kg)  03/05/21 143 lb 6.4 oz (65 kg)     GEN:  Well nourished, well developed in no acute distress HEENT: Normal NECK: No JVD; No carotid bruits LYMPHATICS: No lymphadenopathy CARDIAC: RRR, no murmurs, rubs, gallops RESPIRATORY:  Clear to auscultation without rales, wheezing or rhonchi  ABDOMEN: Soft, non-tender, non-distended MUSCULOSKELETAL:  trace pedal edema; No deformity  SKIN: Warm and dry NEUROLOGIC:  Alert and oriented x 3 PSYCHIATRIC:  Normal affect   ASSESSMENT:    1. Paroxysmal atrial fibrillation (HCC)   2. Shortness of breath   3. Chronic obstructive pulmonary disease, unspecified COPD type ([place])   4. PAD (peripheral artery disease) (HCC)    PLAN:    In order of problems listed above:  PAF He reports controlled palpitations with diltiazem 133m daily. He has only needed short acting diltiazem twice since the last visit. Continue Eliquis 560mBID for stroke ppx.   SOB/COPD Breathing is unchanged. He is following with pulmonology. He had recent PFTs and will likely need 2L O2 at night.   PAD Patient had prior left lower extremity interventions in 2013 and 2014. He follows with vascular, Dr. DeLucky Cowboy No Aspirin with Eliquis. Discussed starting statin with LDL 103, however patient would like to wait at this time. Can consider statin therapy in the future.   Disposition: Follow up in 3 month(s) with MD/APP    Signed, Kathyann Spaugh H Ninfa MeekerPA-C  03/29/2021 3:09  PM     [facility]

## 2021-03-28 ENCOUNTER — Telehealth: Payer: Self-pay | Admitting: Pulmonary Disease

## 2021-03-28 DIAGNOSIS — J449 Chronic obstructive pulmonary disease, unspecified: Secondary | ICD-10-CM

## 2021-03-28 NOTE — Telephone Encounter (Signed)
ONO reviewed by Dr. Patsey Berthold-- recommend 2L QHS  Patient is aware of results and voiced his understanding.  He would like to to proceed with oxygen.  Order has been placed.  Nothing further needed.

## 2021-03-29 ENCOUNTER — Telehealth: Payer: Self-pay

## 2021-03-29 ENCOUNTER — Ambulatory Visit: Payer: PPO | Admitting: Medical

## 2021-03-29 ENCOUNTER — Other Ambulatory Visit: Payer: Self-pay

## 2021-03-29 ENCOUNTER — Encounter: Payer: Self-pay | Admitting: Medical

## 2021-03-29 VITALS — BP 110/70 | HR 70 | Ht 71.0 in | Wt 142.0 lb

## 2021-03-29 DIAGNOSIS — J449 Chronic obstructive pulmonary disease, unspecified: Secondary | ICD-10-CM | POA: Diagnosis not present

## 2021-03-29 DIAGNOSIS — I480 Paroxysmal atrial fibrillation: Secondary | ICD-10-CM | POA: Diagnosis not present

## 2021-03-29 DIAGNOSIS — R0602 Shortness of breath: Secondary | ICD-10-CM | POA: Diagnosis not present

## 2021-03-29 DIAGNOSIS — I739 Peripheral vascular disease, unspecified: Secondary | ICD-10-CM

## 2021-03-29 NOTE — Telephone Encounter (Signed)
Called and spoke to patient in regards to upcoming covid test. Patient had a clear understanding. Nothing further needed.

## 2021-03-29 NOTE — Patient Instructions (Signed)
Medication Instructions:   Your physician recommends that you continue on your current medications as directed. Please refer to the Current Medication list given to you today.   *If you need a refill on your cardiac medications before your next appointment, please call your pharmacy*   Lab Work: None ordered  If you have labs (blood work) drawn today and your tests are completely normal, you will receive your results only by: [place] (if you have MyChart) OR A paper copy in the mail If you have any lab test that is abnormal or we need to change your treatment, we will call you to review the results.   Testing/Procedures: None ordered   Follow-Up: At [facility] you and your health needs are our priority.  As part of our continuing mission to provide you with exceptional heart care, we have created designated Provider Care Teams.  These Care Teams include your primary Cardiologist (physician) and Advanced Practice Providers (APPs -  Physician Assistants and Nurse Practitioners) who all work together to provide you with the care you need, when you need it.  We recommend signing up for the patient portal called "MyChart".  Sign up information is provided on this After Visit Summary.  MyChart is used to connect with patients for Virtual Visits (Telemedicine).  Patients are able to view lab/test results, encounter notes, upcoming appointments, etc.  Non-urgent messages can be sent to your provider as well.   To learn more about what you can do with MyChart, go to [email or web address].    Your next appointment:   3 month(s)  The format for your next appointment:   In Person  Provider:   You may see Nelva Bush, MD or one of the following Advanced Practice Providers on your designated Care Team:   Murray Hodgkins, NP Christell Faith, PA-C Cadence Kathlen Mody, PA-C1}    Other Instructions N/A

## 2021-04-02 ENCOUNTER — Other Ambulatory Visit: Payer: Self-pay

## 2021-04-02 ENCOUNTER — Other Ambulatory Visit
Admission: RE | Admit: 2021-04-02 | Discharge: 2021-04-02 | Disposition: A | Discharge status: Home / Self Care | Payer: PPO | Source: Ambulatory Visit | Attending: Pulmonary Disease | Admitting: Pulmonary Disease

## 2021-04-02 DIAGNOSIS — Z20822 Contact with and (suspected) exposure to covid-19: Secondary | ICD-10-CM | POA: Diagnosis not present

## 2021-04-02 DIAGNOSIS — Z01812 Encounter for preprocedural laboratory examination: Secondary | ICD-10-CM | POA: Insufficient documentation

## 2021-04-02 LAB — SARS CORONAVIRUS 2 (TAT 6-24 HRS): SARS Coronavirus 2: NEGATIVE

## 2021-04-03 ENCOUNTER — Ambulatory Visit: Payer: PPO | Attending: Pulmonary Disease

## 2021-04-03 DIAGNOSIS — J449 Chronic obstructive pulmonary disease, unspecified: Secondary | ICD-10-CM | POA: Diagnosis not present

## 2021-04-03 MED ORDER — ALBUTEROL SULFATE (2.5 MG/3ML) 0.083% IN NEBU
2.5000 mg | INHALATION_SOLUTION | Freq: Once | RESPIRATORY_TRACT | Status: AC
  Filled 2021-04-03: qty 3

## 2021-04-04 ENCOUNTER — Telehealth: Payer: Self-pay | Admitting: Pulmonary Disease

## 2021-04-04 DIAGNOSIS — H2512 Age-related nuclear cataract, left eye: Secondary | ICD-10-CM | POA: Diagnosis not present

## 2021-04-04 DIAGNOSIS — H5703 Miosis: Secondary | ICD-10-CM | POA: Diagnosis not present

## 2021-04-04 DIAGNOSIS — H25813 Combined forms of age-related cataract, bilateral: Secondary | ICD-10-CM | POA: Diagnosis not present

## 2021-04-04 NOTE — Telephone Encounter (Signed)
Tyler Pita, MD  04/04/2021  9:50 AM EST     PFTs are consistent with severe COPD but not significantly change from his study of 2020.    Patient is aware of results and voiced his understanding.  Nothing further needed.

## 2021-04-08 DIAGNOSIS — J449 Chronic obstructive pulmonary disease, unspecified: Secondary | ICD-10-CM | POA: Diagnosis not present

## 2021-04-18 ENCOUNTER — Telehealth: Payer: Self-pay

## 2021-04-18 ENCOUNTER — Other Ambulatory Visit: Payer: Self-pay

## 2021-04-18 ENCOUNTER — Encounter: Payer: Self-pay | Admitting: Pulmonary Disease

## 2021-04-18 ENCOUNTER — Ambulatory Visit (INDEPENDENT_AMBULATORY_CARE_PROVIDER_SITE_OTHER): Payer: PPO | Admitting: Pulmonary Disease

## 2021-04-18 VITALS — BP 112/82 | HR 65 | Temp 97.8°F | Ht 70.5 in | Wt 145.6 lb

## 2021-04-18 DIAGNOSIS — J4550 Severe persistent asthma, uncomplicated: Secondary | ICD-10-CM

## 2021-04-18 DIAGNOSIS — J449 Chronic obstructive pulmonary disease, unspecified: Secondary | ICD-10-CM | POA: Diagnosis not present

## 2021-04-18 DIAGNOSIS — J300 Vasomotor rhinitis: Secondary | ICD-10-CM | POA: Diagnosis not present

## 2021-04-18 MED ORDER — AZELASTINE HCL 0.1 % NA SOLN: 1.0000 | Freq: Two times a day (BID) | NASAL | 12 refills | Status: DC

## 2021-04-18 MED ORDER — ALBUTEROL SULFATE (2.5 MG/3ML) 0.083% IN NEBU: 2.5000 mg | INHALATION_SOLUTION | RESPIRATORY_TRACT | 2 refills | Status: DC | PRN

## 2021-04-18 NOTE — Patient Instructions (Signed)
You may resume formoterol/budesonide (Symbicort) 80 mcg /4.5, 2 puffs twice a day, make sure you rinse your mouth well after you use it you stated you have still enough of this medication at home.  Let us know when you need refills on it. ? ?You may continue Spiriva 2.5 mcg 2 puffs daily.  You indicated that you have enough of this medication at home.  Let us know when you need refills on it. ? ?STOP BREZTRI IF YOU START THE ABOVE MEDICATIONS. ? ?We have sent in prescription for your albuterol rescue inhaler (Proventil). ? ?We have sent noticed to the pharmacy team for your Dupixent. ? ?We have refilled your Astelin. ? ?Continue using your nighttime oxygen. ? ?See you in follow-up in 3 to 4 months time, call sooner should any new problems arise. ? ? ?

## 2021-04-18 NOTE — Telephone Encounter (Signed)
Patient needs renewal for Dupixent. ?

## 2021-04-18 NOTE — Progress Notes (Signed)
Subjective:    Patient ID: Antonio [name], male    DOB: [birth year], 76 y.o.   MRN: [id number] Patient Care Team: [name], MD as PCP - General (Family Medicine) Vickie Epley, MD as PCP - Electrophysiology (Cardiology) End, Harrell Gave, MD as PCP - Cardiology (Cardiology) Pyrtle, Lajuan Lines, MD as Consulting Physician (Gastroenterology) Hollice Espy, MD as Consulting Physician (Urology) Tyler Pita, MD as Consulting Physician (Pulmonary Disease) Valente David, RN as 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  He was noted to have asthma with allergic phenotype and elevated IgE and peripheral eosinophilia.  He was started on Dupixent on September 2020.  He has history significant for coronary artery disease and paroxysmal atrial fibrillation he is on Eliquis for anticoagulation.  He follows here after his visit of 06 March 2021 this is a scheduled visit.  He is currently maintained on Breztri 2 puffs twice a day and as needed albuterol.  He has not had to use his albuterol at all.  He also has nocturnal hypoxemia due to emphysema and is on nocturnal oxygen at 2 L/min was started after overnight oximetry on 14 February showed significant desaturations to 79%.  He notes that this has had a very positive impact on his health and wellbeing.  Feels he has more energy and stamina during the day.  He continues to have issues with shortness of breath with severe exertion however he can perform his activities of daily living and usual gardening chores etc. around the home without difficulty.  He does not endorse any fevers, chills or sweats.  He has not had any cough or sputum production.  No hemoptysis.  No weight loss or anorexia.  No orthopnea, paroxysmal nocturnal dyspnea lower extremity edema nor calf tenderness.    Only issue today is that he wants to  go back to Symbicort and Spiriva, he cannot tell me why as he seems to be well-controlled on the [place].  He states he has deficient medications of both.  I have advised him to think this through and if he still wants to make the change to stop Breztri and give [place] a call.  He needs refills on his albuterol and refill on his Astelin for allergic/vasomotor rhinitis symptoms.  DATA 06/09/2014 pulmonary function test> ratio 51%, FEV1 1.47 L (40% predicted), total lung capacity 5.43 L (71% predicted). DLCO 16.26 (44% predicted) December 2017 lung function testing showed ratio 50%, FEV1 1.47 L 43% predicted, FVC 2.96 L 67% predicted March 2020 pulmonary function testing ratio 38%, FEV1 0.97 L 31% predicted, total lung capacity 8.44 L 119% predicted, residual volume 198% predicted, DLCO 14.0 55% predicted March 2020 high-resolution CT scan of the chest images : Centrilobular emphysema, no interstitial lung disease, aortic atherosclerosis 04/20/2018-eosinophils relative 7.3, eosinophils absolute 0.6 04/20/2018-IgE-67 01/20/2019-SARS-CoV-2-detected 03/06/2021 alpha 1 antitrypsin: Phenotype MM, level 165 03/11/2021 echocardiogram: LVEF 55%, right ventricular function normal.  No wall motion abnormalities. 03/26/2021 overnight oximetry on room air: O2 saturation nadir 79% patient qualified for nocturnal O2 placed on 2 L/min. 04/03/2021 PFT: FEV1 1.09 L or 36% predicted, FVC 2.60 L or 61% predicted, FEV1/FVC 42%, no bronchodilator response. Lung volumes showed moderate air trapping.  Diffusion capacity severely decreased at 49%.   Review of Systems A 10 point review of systems was performed and it is as noted above otherwise negative.  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   PAD (peripheral artery disease) ([place]) 11/08/2019   Paroxysmal atrial fibrillation ([place]) 09/02/2019   Stiffness of finger joint of left hand 04/06/2019   History of COVID-19 01/20/2019   Severe persistent asthma without complication [id number]   Medication management 09/20/2018   Chronic obstructive pulmonary disease ([place]) 09/20/2018   Increased eosinophils in the blood 06/21/2018   Diffusion capacity of lung (dl), decreased 03/11/2018   COPD with acute exacerbation ([place]) 06/22/2017   Atrial flutter, paroxysmal ([place]) 10/17/2016   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/2016   SVT (supraventricular tachycardia) 10/17/2016   Ulcerative colitis with complication ([place]) [id number]   Cervical stenosis of spinal canal 05/05/2016   Carotid stenosis 05/02/2016   Atherosclerosis of native arteries of extremity with intermittent claudication ([place]) 05/02/2016   Cramping of hands 01/10/2016   Bilateral carpal tunnel syndrome 12/06/2015   Centrilobular Emphysema 04/25/2014   Former smoker 04/25/2014   Tobacco abuse 04/25/2014   Social History   Tobacco Use   Smoking status: Former    Packs/day: 0.50    Years: 35.00    Total pack years: 17.50    Types: Cigarettes, Cigars    Quit date: 06/11/2011    Years since quitting: 10.8   Smokeless tobacco: Never   Tobacco comments:    Former smoker 12/19/2020  Substance Use Topics   Alcohol use: Yes    Alcohol/week: 6.0 standard drinks of alcohol    Types: 6 Shots of liquor per week    Comment: wine, liquor, or beer   Allergies  Allergen Reactions   Penicillins Rash    Has patient had a PCN reaction causing immediate rash, facial/tongue/throat swelling, SOB or lightheadedness with hypotension:Yes Has patient had a PCN reaction causing severe rash involving mucus membranes or skin necrosis:Yes Has patient had a PCN reaction that required hospitalization:No Has patient had a PCN reaction  occurring within the last 10 years:No If all of the above answers are "NO", then may proceed with Cephalosporin use.    Current Meds  Medication Sig   albuterol (PROVENTIL) (2.5 MG/3ML) 0.083% nebulizer solution Take 3 mLs (2.5 mg total) by nebulization every 6 (six) hours as needed for wheezing or shortness of breath.   albuterol (VENTOLIN HFA) 108 (90 Base) MCG/ACT inhaler Inhale 2 puffs into the lungs every 6 (six) hours as needed for wheezing or shortness of breath.   apixaban (ELIQUIS) 5 MG TABS tablet TAKE 1 TABLET BY MOUTH TWICE A DAY   azelastine (ASTELIN) 0.1 % nasal spray Place 1 spray into both nostrils 2 (two) times daily. Use in each nostril as directed   Budeson-Glycopyrrol-Formoterol (BREZTRI AEROSPHERE) 160-9-4.8 MCG/ACT AERO Inhale 2 puffs into the lungs in the morning and at bedtime.   dupilumab (DUPIXENT) 300 MG/2ML prefilled syringe Inject 300 mg into the skin every 14 (fourteen) days.   EPINEPHrine 0.3 mg/0.3 mL IJ SOAJ injection Inject 0.3 mg into the muscle as needed for anaphylaxis.   hydrocortisone 2.5 % cream Apply 1 application. topically as needed.   ipratropium (ATROVENT) 0.02 % nebulizer solution Take 2.5 mLs (0.5 mg total) by nebulization 4 (four) times daily.   mesalamine (LIALDA) 1.2 g EC tablet TAKE 4 TABLETS BY MOUTH DAILY WITH  BREAKFAST   metoprolol succinate (TOPROL XL) 25 MG 24 hr tablet Take 1 tablet (25 mg total) by mouth daily.   rosuvastatin (CRESTOR) 10 MG tablet Take 1 tablet (10 mg total) by mouth daily.    Immunization History  Administered Date(s) Administered   Fluad Quad(high Dose 65+) 11/01/2020, 10/29/2021   Influenza Inj Mdck Quad Pf 10/28/2017   Influenza Split 11/24/2013   Influenza, High Dose Seasonal PF 11/05/2016, 10/29/2017, 10/06/2018, 11/03/2019   Influenza,inj,Quad PF,6+ Mos 11/08/2014, 11/26/2015   Influenza-Unspecified 10/11/2013, 10/11/2013   PFIZER(Purple Top)SARS-COV-2 Vaccination 05/06/2019, 05/31/2019, 01/27/2020    Pneumococcal Conjugate-13 04/18/2014   Pneumococcal Polysaccharide-23 11/26/2015   Tdap 02/13/2011   Zoster Recombinat (Shingrix) 12/01/2016, 03/02/2017   Zoster, Live 11/07/2010       Objective:   Physical Exam BP 112/82 (BP Location: Left Arm, Patient Position: Sitting, Cuff Size: Normal)   Pulse 65   Temp 97.8 F (36.6 C) (Oral)   Ht 5' 10.5" (1.791 m)   Wt 145 lb 9.6 oz (66 kg)   SpO2 94%   BMI 20.60 kg/m   SpO2: 94 % O2 Device: None (Room air)  GENERAL: Thin well-developed gentleman, no acute distress.  Well-groomed, fully ambulatory.  No conversational dyspnea. HEAD: Normocephalic, atraumatic.  EYES: Pupils equal, round, reactive to light.  No scleral icterus.  MOUTH: Oral mucosa moist.  No thrush. NECK: Supple. No thyromegaly. Trachea midline. No JVD.  No adenopathy. PULMONARY: Good air entry bilaterally.  No adventitious sounds. CARDIOVASCULAR: S1 and S2.  No rubs, murmurs or gallops heard. ABDOMEN: Benign. MUSCULOSKELETAL: No joint deformity, no clubbing, no edema.  NEUROLOGIC: No overt focal deficit, no gait disturbance, speech is fluent. SKIN: Intact,warm,dry. PSYCH: Mood and behavior normal.        Assessment & Plan:     ICD-10-CM   1. Stage 3 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 ([place])  J44.9    Recommend that he continue on LABA/LAMA/ICS He is to let us know if he goes back to Symbicort and Spiriva Refilled albuterol    2. Severe persistent asthma without complication  [id number]    Continue Dupixent Continue inhaler therapy    3. Vasomotor rhinitis  J30.0 azelastine (ASTELIN) 0.1 % nasal spray   Continue Astelin     Meds ordered this encounter  Medications   albuterol (PROVENTIL) (2.5 MG/3ML) 0.083% nebulizer solution    Sig: Take 3 mLs (2.5 mg total) by nebulization every 4 (four) hours as needed for wheezing or shortness of breath.    Dispense:  75 mL    Refill:  2   azelastine (ASTELIN) 0.1 % nasal spray    Sig: Place 1 spray into both  nostrils 2 (two) times daily. Use in each nostril as directed    Dispense:  30 mL    Refill:  12   Will see the patient in follow-up in 3 to 4 months time he is to contact us prior to that time should any new difficulties arise.   Renold Don, MD Advanced Bronchoscopy PCCM [facility]    *This note was dictated using voice recognition software/Dragon.  Despite best efforts to proofread, errors can occur which can change the meaning. Any transcriptional errors that result from this process are unintentional and may not be fully corrected at the time of dictation.

## 2021-04-19 MED ORDER — DUPIXENT 300 MG/2ML ~~LOC~~ SOSY: 300.0000 mg | PREFILLED_SYRINGE | SUBCUTANEOUS | 1 refills | Status: DC

## 2021-04-19 NOTE — Telephone Encounter (Signed)
Called Dupixent MyWay to assess patient's current patient assistance enrollment for calendar year 2023. Per rep patient is actively enrolled through 02/09/22 ? ?Phone: [phone], option 1, option 2, option 2 ? ?Refill escribed to [facility] today. Patient advised MyChart ? ?Knox Saliva, PharmD, MPH, BCPS ?Clinical Pharmacist (Rheumatology and Pulmonology) ?

## 2021-04-22 ENCOUNTER — Telehealth: Payer: Self-pay | Admitting: Pulmonary Disease

## 2021-04-22 ENCOUNTER — Other Ambulatory Visit: Payer: Self-pay

## 2021-04-22 DIAGNOSIS — J449 Chronic obstructive pulmonary disease, unspecified: Secondary | ICD-10-CM

## 2021-04-22 MED ORDER — ALBUTEROL SULFATE (2.5 MG/3ML) 0.083% IN NEBU: 2.5000 mg | INHALATION_SOLUTION | Freq: Four times a day (QID) | RESPIRATORY_TRACT | 12 refills | Status: DC | PRN

## 2021-04-22 MED ORDER — ALBUTEROL SULFATE HFA 108 (90 BASE) MCG/ACT IN AERS: 2.0000 | INHALATION_SPRAY | Freq: Four times a day (QID) | RESPIRATORY_TRACT | 2 refills | Status: DC | PRN

## 2021-04-22 NOTE — Telephone Encounter (Addendum)
Albuterol HFA has been sent to preferred pharmacy.  ?Patient is aware and voiced his understanding.  ?Nothing further needed.  ? ?

## 2021-04-24 DIAGNOSIS — H25812 Combined forms of age-related cataract, left eye: Secondary | ICD-10-CM | POA: Diagnosis not present

## 2021-04-24 DIAGNOSIS — H268 Other specified cataract: Secondary | ICD-10-CM | POA: Diagnosis not present

## 2021-04-30 DIAGNOSIS — L821 Other seborrheic keratosis: Secondary | ICD-10-CM | POA: Diagnosis not present

## 2021-04-30 DIAGNOSIS — D485 Neoplasm of uncertain behavior of skin: Secondary | ICD-10-CM | POA: Diagnosis not present

## 2021-04-30 DIAGNOSIS — L218 Other seborrheic dermatitis: Secondary | ICD-10-CM | POA: Diagnosis not present

## 2021-04-30 DIAGNOSIS — D0439 Carcinoma in situ of skin of other parts of face: Secondary | ICD-10-CM | POA: Diagnosis not present

## 2021-05-06 DIAGNOSIS — J449 Chronic obstructive pulmonary disease, unspecified: Secondary | ICD-10-CM | POA: Diagnosis not present

## 2021-05-19 ENCOUNTER — Other Ambulatory Visit: Payer: Self-pay | Admitting: Internal Medicine

## 2021-05-20 NOTE — Telephone Encounter (Signed)
Refill Request.  

## 2021-05-20 NOTE — Telephone Encounter (Signed)
Prescription refill request for Eliquis received. ?Indication: PAF ?Last office visit: 03/29/21  Read Drivers PA-C ?Scr: 0.91 on 11/20/20 ?Age: 76 ?Weight: 66.4kg ? ?Based on above findings Eliquis 12m twice daily is the appropriate dose.  Refill approved. ? ?

## 2021-05-23 ENCOUNTER — Telehealth: Payer: Self-pay | Admitting: Pulmonary Disease

## 2021-05-23 DIAGNOSIS — H25811 Combined forms of age-related cataract, right eye: Secondary | ICD-10-CM | POA: Diagnosis not present

## 2021-05-24 NOTE — Telephone Encounter (Signed)
Called and spoke with patient, he states he was given an oxygen concentrator and he wants to have the one from [employer] picked up.  He is continuing to wear his oxygen at night like Dr. Patsey Berthold as instructed him.  He wanted to know about the large tank that is used if the power goes out, how much it would cost to rent that or where he could buy one.  I advised him he could call [employer] and they could advise him.  I provided him with their phone #, he also asked where [employer] was located in [place], I provided him with the address.  I let him know I would make Dr. Patsey Berthold aware of the situation with the oxygen.  He verbalized understanding. ? ?Dr. Patsey Berthold, ?Please advise if ok to send an order to [employer] to pick up the oxygen concentrator since the patient has a concentrator that was given to him to have?  Thank you. ?

## 2021-05-24 NOTE — Telephone Encounter (Signed)
Best he deals with [employer] on that situation.  The other thing that he has to be concerned about is that the concentrator that he was gifted will likely not have someone to service it if it has difficulties. ?

## 2021-05-24 NOTE — Telephone Encounter (Signed)
Called and spoke with patient, provided recommendations per Dr. Patsey Berthold.  He states he spoke with [employer] and they have to have an order to pick up their concentrator.  He verbalized that he has someone to service the concentrator that he was gifted.  I let him know that I would make Dr. Patsey Berthold aware and we would go from there.  He verbalized understanding. ? ?Dr. Patsey Berthold,  ?Please advise.  Thank you. ?

## 2021-05-29 DIAGNOSIS — H25811 Combined forms of age-related cataract, right eye: Secondary | ICD-10-CM | POA: Diagnosis not present

## 2021-05-29 DIAGNOSIS — H268 Other specified cataract: Secondary | ICD-10-CM | POA: Diagnosis not present

## 2021-06-12 DIAGNOSIS — D0439 Carcinoma in situ of skin of other parts of face: Secondary | ICD-10-CM | POA: Diagnosis not present

## 2021-06-27 ENCOUNTER — Telehealth: Payer: Self-pay | Admitting: Family Medicine

## 2021-06-27 ENCOUNTER — Telehealth: Payer: Self-pay | Admitting: Internal Medicine

## 2021-06-27 NOTE — Telephone Encounter (Signed)
Copied from [place] [phone]. Topic: General - Other >> Jun 27, 2021 12:11 PM McGill, Nelva Bush wrote: Reason for CRM: Ivin Booty from [facility] called requesting to speak with Baxter Flattery. Ivin Booty stated it was a mistake. Don't worry about records.

## 2021-06-27 NOTE — Telephone Encounter (Signed)
  Baxter Flattery with [facility] calling, she said, they received a request for recent OV notes, however, she has some few questions about it since pt was last seen in December. She said if the nurse can call her back and may request to speak with her

## 2021-06-27 NOTE — Telephone Encounter (Signed)
Spoke with Silvio Clayman to disregard the request for medical records. Silvio Clayman will pass the message to Baxter Flattery.

## 2021-07-02 NOTE — Progress Notes (Unsigned)
Follow-up Outpatient Visit Date: 07/03/2021  Primary Care Provider: [name], [place] [address]  Chief Complaint: Shortness of breath and palpitations  HPI:  Antonio [name] is a 76 y.o. male with history of atrial fibrillation/flutter, SVT, hypertension, hyperlipidemia, ulcerative colitis, and asthma/COPD, who presents for follow-up of atrial fibrillation/flutter.  He was last seen in our office in February by Tarri Glenn, [place], at which time he reported 2 episodes of palpitations.  Symptoms improved with his short acting diltiazem.  Chronic shortness of breath was stable.  Today, Antonio [name] feels like he went back into A-fib this morning.  Specifically, he notes that his heart rate is faster than usual.  His chronic shortness of breath with minimal activity is unchanged.  It did not improve with switching from metoprolol to diltiazem.  He also feels like metoprolol has controlled his heart rate better in the past.  He reports being compliant with his medications, including diltiazem and apixaban.  He notes easy bruising but has not had any significant bleeding.  He denies chest pain, lightheadedness, edema, and orthopnea.  --------------------------------------------------------------------------------------------------  Cardiovascular History & Procedures: Cardiovascular Problems: Atrial fibrillation/flutter SVT PAD (followed by Dr. Lucky Cowboy)   Risk Factors: PAD, hyperlipidemia, male gender, age greater than 44, and prior tobacco use   Cath/PCI: None   CV Surgery: Left SFA and proximal popliteal artery stent (06/16/2012). Left SFA PTA and stent placement (10/20/2011).   EP Procedures and Devices: None   Non-Invasive Evaluation(s): TTE (03/11/2021): Normal LV size and wall thickness.  LVEF 55% with normal diastolic function.  Normal RV size and function.  Normal biatrial size.  No significant valvular abnormality.  Normal CVP. TTE (09/09/2019, [facility]):  Technically difficult study.  Normal LV size and wall thickness.  LVEF 60-65% with normal diastolic function.  Normal RV size and function.  Normal biatrial size.  Mild mitral regurgitation.  Recent CV Pertinent Labs: Lab Results  Component Value Date   CHOL 175 12/17/2020   HDL 54 12/17/2020   LDLCALC 103 (H) 12/17/2020   TRIG 96 12/17/2020   CHOLHDL 2.6 12/13/2018   K 4.5 12/14/2019   K 4.2 10/01/2013   BUN 9 12/14/2019   BUN 11 10/01/2013   CREATININE 0.94 12/14/2019   CREATININE 0.93 10/01/2013    Past medical and surgical history were reviewed and updated in EPIC.  Current Meds  Medication Sig   albuterol (PROVENTIL) (2.5 MG/3ML) 0.083% nebulizer solution Take 3 mLs (2.5 mg total) by nebulization every 6 (six) hours as needed for wheezing or shortness of breath.   albuterol (VENTOLIN HFA) 108 (90 Base) MCG/ACT inhaler Inhale 2 puffs into the lungs every 6 (six) hours as needed for wheezing or shortness of breath.   apixaban (ELIQUIS) 5 MG TABS tablet TAKE 1 TABLET BY MOUTH TWICE A DAY   azelastine (ASTELIN) 0.1 % nasal spray Place 1 spray into both nostrils 2 (two) times daily. Use in each nostril as directed   Budeson-Glycopyrrol-Formoterol (BREZTRI AEROSPHERE) 160-9-4.8 MCG/ACT AERO Inhale 2 puffs into the lungs in the morning and at bedtime.   diltiazem (CARDIZEM CD) 180 MG 24 hr capsule Take 1 capsule (180 mg total) by mouth daily.   diltiazem (CARDIZEM) 30 MG tablet Take 1 tablet (30 mg total) by mouth every 6 (six) hours as needed (palpitations and elevated heart rate).   dupilumab (DUPIXENT) 300 MG/2ML prefilled syringe Inject 300 mg into the skin every 14 (fourteen) days.   EPINEPHrine 0.3 mg/0.3 mL  IJ SOAJ injection Inject 0.3 mg into the muscle as needed for anaphylaxis.   hydrocortisone 2.5 % cream Apply 1 application. topically as needed.   ipratropium (ATROVENT) 0.02 % nebulizer solution Take 2.5 mLs (0.5 mg total) by nebulization 4 (four) times daily.   mesalamine  (LIALDA) 1.2 g EC tablet TAKE 4 TABLETS BY MOUTH DAILY WITH BREAKFAST    Allergies: Penicillins  Social History   Tobacco Use   Smoking status: Former    Packs/day: 0.50    Years: 35.00    Pack years: 17.50    Types: Cigarettes, Cigars    Quit date: 06/11/2011    Years since quitting: 10.0   Smokeless tobacco: Never   Tobacco comments:    Former smoker 12/19/2020  Vaping Use   Vaping Use: Former  Substance Use Topics   Alcohol use: Yes    Alcohol/week: 6.0 standard drinks    Types: 6 Shots of liquor per week   Drug use: No    Family History  Problem Relation Age of Onset   Cancer Mother        leukemia   Colon polyps Mother    Heart disease Father    Heart attack Father 40   Healthy Daughter    Healthy Son    Colon cancer Neg Hx    Esophageal cancer Neg Hx    Rectal cancer Neg Hx    Stomach cancer Neg Hx     Review of Systems: A 12-system review of systems was performed and was negative except as noted in the HPI.  --------------------------------------------------------------------------------------------------  Physical Exam: BP 110/80 (BP Location: Left Arm, Patient Position: Sitting, Cuff Size: Normal)   Pulse 100   Ht 5' 10.5" (1.791 m)   Wt 144 lb (65.3 kg)   SpO2 97%   BMI 20.37 kg/m   General:  NAD. Neck: No JVD or HJR. Lungs: Clear to auscultation bilaterally without wheezes or crackles. Heart: Irregularly irregular rhythm without murmurs, rubs, or gallops. Abdomen: Soft, nontender, nondistended. Extremities: No lower extremity edema.  EKG: Atrial fibrillation (ventricular rate 100 bpm) with low voltage and nonspecific ST/T changes.  Lab Results  Component Value Date   WBC 11.2 (H) 03/06/2021   HGB 15.0 03/06/2021   HCT 44.7 03/06/2021   MCV 90.7 03/06/2021   PLT 207 03/06/2021    Lab Results  Component Value Date   NA 143 12/14/2019   K 4.5 12/14/2019   CL 104 12/14/2019   CO2 22 12/14/2019   BUN 9 12/14/2019   CREATININE 0.94  12/14/2019   GLUCOSE 83 12/14/2019   ALT 15 12/14/2019    Lab Results  Component Value Date   CHOL 175 12/17/2020   HDL 54 12/17/2020   LDLCALC 103 (H) 12/17/2020   TRIG 96 12/17/2020   CHOLHDL 2.6 12/13/2018    --------------------------------------------------------------------------------------------------  ASSESSMENT AND PLAN: Paroxysmal atrial fibrillation: Antonio [name] is in atrial fibrillation again today.  He believes this episode started this morning.  He believes the most recent prior episode that he was aware of occurred about 2 weeks ago.  He has persistent exertional dyspnea and fatigue that is multifactorial but could be related to his PAF.  He has not felt any better since switching from metoprolol to diltiazem and feels like metoprolol did a better job of controlling his heart rate.  We will therefore discontinue daily diltiazem and return back to metoprolol succinate 25 mg daily.  Antonio [name] can continue to use diltiazem 30 mg every 6  hours as needed for breakthrough symptoms.  We will continue anticoagulation with apixaban and also refer him to electrophysiology to discuss rhythm control options.  Dyspnea on exertion: This has been a longstanding and is likely multifactorial, including obstructive lung disease and a component of atrial fibrillation.  PFTs in February showed severe COPD, unchanged since 2020.  Echo in January showed normal biventricular systolic function and normal diastolic parameters.  Given that Antonio [name] is at risk for ischemic heart disease that may be contributing to his symptoms, we have agreed to perform a pharmacologic myocardial perfusion stress test.  We will defer placing him on aspirin given long-term anticoagulation with apixaban.  PAD: No claudication reported.  Continue apixaban and lieu of aspirin.  Patient would benefit from a statin given his PAD and most recent LDL of 103.  Addition of a statin was previously recommended though it does not  appear this has been performed.  We will check labs today; I would favor adding moderate-high intensity statin therapy if his LFTs allow.  Continue ongoing follow-up with vascular surgery.  COPD: Severe by PFTs earlier this year.  Ongoing management per Dr. Patsey Berthold.  Shared Decision Making/Informed Consent The risks [chest pain, shortness of breath, cardiac arrhythmias, dizziness, blood pressure fluctuations, myocardial infarction, stroke/transient ischemic attack, nausea, vomiting, allergic reaction, radiation exposure, metallic taste sensation and life-threatening complications (estimated to be 1 in 10,000)], benefits (risk stratification, diagnosing coronary artery disease, treatment guidance) and alternatives of a nuclear stress test were discussed in detail with Antonio [name] and he agrees to proceed.  Follow-up: Return to clinic in 3 months.  Nelva Bush, MD 07/03/2021 10:29 AM

## 2021-07-03 ENCOUNTER — Encounter: Payer: Self-pay | Admitting: Internal Medicine

## 2021-07-03 ENCOUNTER — Ambulatory Visit (INDEPENDENT_AMBULATORY_CARE_PROVIDER_SITE_OTHER): Payer: PPO | Admitting: Internal Medicine

## 2021-07-03 ENCOUNTER — Other Ambulatory Visit
Admission: RE | Admit: 2021-07-03 | Discharge: 2021-07-03 | Disposition: A | Discharge status: Home / Self Care | Payer: PPO | Attending: Internal Medicine | Admitting: Internal Medicine

## 2021-07-03 ENCOUNTER — Telehealth: Payer: Self-pay | Admitting: *Deleted

## 2021-07-03 VITALS — BP 110/80 | HR 100 | Ht 70.5 in | Wt 144.0 lb

## 2021-07-03 DIAGNOSIS — I480 Paroxysmal atrial fibrillation: Secondary | ICD-10-CM | POA: Diagnosis not present

## 2021-07-03 DIAGNOSIS — J449 Chronic obstructive pulmonary disease, unspecified: Secondary | ICD-10-CM

## 2021-07-03 DIAGNOSIS — I739 Peripheral vascular disease, unspecified: Secondary | ICD-10-CM

## 2021-07-03 DIAGNOSIS — I4891 Unspecified atrial fibrillation: Secondary | ICD-10-CM | POA: Insufficient documentation

## 2021-07-03 DIAGNOSIS — R0609 Other forms of dyspnea: Secondary | ICD-10-CM | POA: Insufficient documentation

## 2021-07-03 DIAGNOSIS — I70212 Atherosclerosis of native arteries of extremities with intermittent claudication, left leg: Secondary | ICD-10-CM

## 2021-07-03 LAB — COMPREHENSIVE METABOLIC PANEL
ALT: 19 U/L (ref 0–44)
AST: 23 U/L (ref 15–41)
Albumin: 4.2 g/dL (ref 3.5–5.0)
Alkaline Phosphatase: 67 U/L (ref 38–126)
Anion gap: 7 (ref 5–15)
BUN: 14 mg/dL (ref 8–23)
CO2: 27 mmol/L (ref 22–32)
Calcium: 9.6 mg/dL (ref 8.9–10.3)
Chloride: 107 mmol/L (ref 98–111)
Creatinine, Ser: 1.09 mg/dL (ref 0.61–1.24)
GFR, Estimated: >60 mL/min (ref >60)
Glucose, Bld: 84 mg/dL (ref 70–99)
Potassium: 5 mmol/L (ref 3.5–5.1)
Sodium: 141 mmol/L (ref 135–145)
Total Bilirubin: 0.9 mg/dL (ref 0.3–1.2)
Total Protein: 8.4 g/dL — ABNORMAL HIGH (ref 6.5–8.1)

## 2021-07-03 LAB — CBC
HCT: 47.1 % (ref 39.0–52.0)
Hemoglobin: 15.6 g/dL (ref 13.0–17.0)
MCH: 30.5 pg (ref 26.0–34.0)
MCHC: 33.1 g/dL (ref 30.0–36.0)
MCV: 92 fL (ref 80.0–100.0)
Platelets: 247 10*3/uL (ref 150–400)
RBC: 5.12 MIL/uL (ref 4.22–5.81)
RDW: 11.8 % (ref 11.5–15.5)
WBC: 11.6 10*3/uL — ABNORMAL HIGH (ref 4.0–10.5)
nRBC: 0 % (ref 0.0–0.2)

## 2021-07-03 LAB — TSH: TSH: 1.783 u[IU]/mL (ref 0.350–4.500)

## 2021-07-03 LAB — MAGNESIUM: Magnesium: 2.3 mg/dL (ref 1.7–2.4)

## 2021-07-03 MED ORDER — METOPROLOL SUCCINATE ER 25 MG PO TB24: 25.0000 mg | ORAL_TABLET | Freq: Every day | ORAL | 0 refills | Status: DC

## 2021-07-03 MED ORDER — ROSUVASTATIN CALCIUM 10 MG PO TABS: 10.0000 mg | ORAL_TABLET | Freq: Every day | ORAL | 0 refills | Status: DC

## 2021-07-03 NOTE — Telephone Encounter (Signed)
-----   Message from Nelva Bush, MD sent at 07/03/2021  1:30 PM EDT ----- Please let Antonio [name] know that his kidney function, liver function, thyroid function, and electrolytes are normal.  Other than a mildly elevated white blood cell count, which was seen 3 months ago as well, his blood counts are normal.  Given his normal liver function and history of peripheral vascular disease, I recommend that we add rosuvastatin 10 mg daily in an effort to help improve his cholesterol.  We will need to check a lipid panel and ALT in about 3 months.  He should move forward with the stress test, as we discussed at today's office visit.  I will forward the results of our labs to Dr. Ronnald Ramp as well for her review.

## 2021-07-03 NOTE — Telephone Encounter (Signed)
Attempted to call pt. No answer. Lmtcb.  

## 2021-07-03 NOTE — Patient Instructions (Signed)
Medication Instructions:   Your physician has recommended you make the following change in your medication:   STOP taking (long acting) Diltiazem 180 mg   START Metoprolol Succinate 25 mg daily   *If you need a refill on your cardiac medications before your next appointment, please call your pharmacy*   Lab Work:  Labs today at the medical mall at [facility]: CBC, CMET, TSH, Magnesium   -  Please go to the [place] at [employer] in at the Registration Desk: 1st desk to the right, past the screening table   If you have labs (blood work) drawn today and your tests are completely normal, you will receive your results only by: [place] (if you have MyChart) OR A paper copy in the mail If you have any lab test that is abnormal or we need to change your treatment, we will call you to review the results.   Testing/Procedures:  [place]  Your caregiver has ordered a Stress Test with nuclear imaging (Lexiscan Myoview). The purpose of this test is to evaluate the blood supply to your heart muscle. This procedure is referred to as a "Non-Invasive Stress Test." This is because other than having an IV started in your vein, nothing is inserted or "invades" your body. Cardiac stress tests are done to find areas of poor blood flow to the heart by determining the extent of coronary artery disease (CAD). Some patients exercise on a treadmill, which naturally increases the blood flow to your heart, while others who are  unable to walk on a treadmill due to physical limitations have a pharmacologic/chemical stress agent called Lexiscan . This medicine will mimic walking on a treadmill by temporarily increasing your coronary blood flow.   Please note: these test may take anywhere between 2-4 hours to complete  PLEASE REPORT TO [place] AT THE FIRST DESK WILL DIRECT YOU WHERE TO GO  Date of Procedure:_____________________________________  Arrival  Time for Procedure:______________________________   PLEASE NOTIFY THE OFFICE AT LEAST 24 HOURS IN ADVANCE IF YOU ARE UNABLE TO KEEP YOUR APPOINTMENT.  [phone] AND  PLEASE NOTIFY NUCLEAR MEDICINE AT [facility] AT LEAST 24 HOURS IN ADVANCE IF YOU ARE UNABLE TO KEEP YOUR APPOINTMENT. [phone]  How to prepare for your Myoview test:  Do not eat or drink after midnight No caffeine for 24 hours prior to test No smoking 24 hours prior to test. Your medication may be taken with water.  If your doctor stopped a medication because of this test, do not take that medication. Ladies, please do not wear dresses.  Skirts or pants are appropriate. Please wear a short sleeve shirt. No perfume, cologne or lotion. Wear comfortable walking shoes. No heels!   Follow-Up: At [facility], you and your health needs are our priority.  As part of our continuing mission to provide you with exceptional heart care, we have created designated Provider Care Teams.  These Care Teams include your primary Cardiologist (physician) and Advanced Practice Providers (APPs -  Physician Assistants and Nurse Practitioners) who all work together to provide you with the care you need, when you need it.  We recommend signing up for the patient portal called "MyChart".  Sign up information is provided on this After Visit Summary.  MyChart is used to connect with patients for Virtual Visits (Telemedicine).  Patients are able to view lab/test results, encounter notes, upcoming appointments, etc.  Non-urgent messages can be sent to your provider  as well.   To learn more about what you can do with MyChart, go to [email or web address].    Your next appointment:   3 month(s)  The format for your next appointment:   In Person  Provider:   You may see Dr. Harrell Gave End or one of the following Advanced Practice Providers on your designated Care Team:   Murray Hodgkins, NP Christell Faith, PA-C Cadence Kathlen Mody, PA-C    Other  Instructions  You have been referred to Dr. Quentin Ore with Electrophysiology for paroxysmal atrial fibrillation.   Important Information About Sugar

## 2021-07-03 NOTE — Telephone Encounter (Signed)
Spoke with pt. Notified of lab results below and Dr. Darnelle Bos recc.  Pt voiced understanding. Pt will:  START rosuvastatin 10 mg daily    - A new Rx has been sent to pt's pharmacy  CHECK fasting lipid and ALT in ~3 months   - Future orders placed   - Notified pt I will contact him closer to 3 months on lab instructions/location  Pt has no further questions at this time.

## 2021-07-05 DIAGNOSIS — H524 Presbyopia: Secondary | ICD-10-CM | POA: Diagnosis not present

## 2021-07-09 ENCOUNTER — Ambulatory Visit
Admission: RE | Admit: 2021-07-09 | Discharge: 2021-07-09 | Disposition: A | Discharge status: Home / Self Care | Payer: PPO | Source: Ambulatory Visit | Attending: Internal Medicine | Admitting: Internal Medicine

## 2021-07-09 DIAGNOSIS — R0609 Other forms of dyspnea: Secondary | ICD-10-CM | POA: Diagnosis not present

## 2021-07-09 MED ORDER — TECHNETIUM TC 99M TETROFOSMIN IV KIT
10.3200 | PACK | Freq: Once | INTRAVENOUS | Status: AC | PRN
  Administered 2021-07-09: 10.32 via INTRAVENOUS

## 2021-07-09 MED ORDER — REGADENOSON 0.4 MG/5ML IV SOLN
0.4000 mg | Freq: Once | INTRAVENOUS | Status: AC
Start: 2021-07-09 — End: 2021-07-09
  Administered 2021-07-09: 0.4 mg via INTRAVENOUS

## 2021-07-09 MED ORDER — TECHNETIUM TC 99M TETROFOSMIN IV KIT
31.7100 | PACK | Freq: Once | INTRAVENOUS | Status: AC | PRN
  Administered 2021-07-09: 31.71 via INTRAVENOUS

## 2021-07-10 LAB — NM MYOCAR MULTI W/SPECT W/WALL MOTION / EF
LV dias vol: 50 mL (ref 62–150)
LV sys vol: 11 mL
Nuc Stress EF: 78 %
Peak HR: 83 {beats}/min
Percent HR: 58 %
Rest HR: 55 {beats}/min
Rest Nuclear Isotope Dose: 10.3 mCi
SDS: 0
SRS: 1
SSS: 0
ST Depression (mm): 0 mm
Stress Nuclear Isotope Dose: 31.7 mCi
TID: 0.88

## 2021-07-23 ENCOUNTER — Encounter: Payer: Self-pay | Admitting: Pulmonary Disease

## 2021-07-23 ENCOUNTER — Ambulatory Visit: Payer: PPO | Admitting: Pulmonary Disease

## 2021-07-23 VITALS — BP 122/68 | HR 78 | Temp 97.7°F | Ht 70.5 in | Wt 149.2 lb

## 2021-07-23 DIAGNOSIS — G4736 Sleep related hypoventilation in conditions classified elsewhere: Secondary | ICD-10-CM

## 2021-07-23 DIAGNOSIS — J439 Emphysema, unspecified: Secondary | ICD-10-CM | POA: Diagnosis not present

## 2021-07-23 DIAGNOSIS — L923 Foreign body granuloma of the skin and subcutaneous tissue: Secondary | ICD-10-CM | POA: Diagnosis not present

## 2021-07-23 DIAGNOSIS — I480 Paroxysmal atrial fibrillation: Secondary | ICD-10-CM | POA: Diagnosis not present

## 2021-07-23 DIAGNOSIS — J449 Chronic obstructive pulmonary disease, unspecified: Secondary | ICD-10-CM | POA: Diagnosis not present

## 2021-07-23 DIAGNOSIS — J4550 Severe persistent asthma, uncomplicated: Secondary | ICD-10-CM | POA: Diagnosis not present

## 2021-07-23 NOTE — Progress Notes (Signed)
Subjective:    Patient ID: Antonio [name], male    DOB: Mar 02, 1945, 76 y.o.   MRN: [id number] Patient Care Team: [name], MD as PCP - General (Family Medicine) Pyrtle, Lajuan Lines, MD as Consulting Physician (Gastroenterology) Juanito Doom, MD as Consulting Physician (Pulmonary Disease)  Chief Complaint  Patient presents with   Follow-up    SOB with exertion.    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  He was noted to have asthma with allergic phenotype and elevated IgE and peripheral eosinophilia.  He was started on Dupixent on September 2020.  He has history significant for coronary artery disease and paroxysmal atrial fibrillation he is on Eliquis for anticoagulation.  He follows here after his visit of 18 April 2021 this is a scheduled visit.  He is currently maintained on Breztri 2 puffs twice a day and as needed albuterol.  He has not had to use his albuterol at all.  He also has nocturnal hypoxemia due to emphysema and is on nocturnal oxygen at 2 L/min and notes that this has had a very positive impact on his health and wellbeing.  He continues to have issues with shortness of breath with severe exertion however he can perform his activities of daily living and usual gardening chores etc. around the home without difficulty.  He does not endorse any fevers, chills or sweats.  He has not had any cough or sputum production.  No hemoptysis.  No weight loss or anorexia.  No orthopnea, paroxysmal nocturnal dyspnea lower extremity edema nor calf tenderness.  Overall he feels well and looks well.  DATA 06/09/2014 pulmonary function test> ratio 51%, FEV1 1.47 L (40% predicted), total lung capacity 5.43 L (71% predicted). DLCO 16.26 (44% predicted) December 2017 lung function testing showed ratio 50%, FEV1 1.47 L 43% predicted, FVC 2.96 L 67% predicted March 2020 pulmonary function testing ratio  38%, FEV1 0.97 L 31% predicted, total lung capacity 8.44 L 119% predicted, residual volume 198% predicted, DLCO 14.0 55% predicted March 2020 high-resolution CT scan of the chest images : Centrilobular emphysema, no interstitial lung disease, aortic atherosclerosis 04/20/2018-eosinophils relative 7.3, eosinophils absolute 0.6 04/20/2018-IgE-67 01/20/2019-SARS-CoV-2-detected 03/06/2021 alpha 1 antitrypsin: Phenotype MM, level 165 03/11/2021 echocardiogram: LVEF 55%, right ventricular function normal.  No wall motion abnormalities. 04/03/2021 PFT: FEV1 1.09 L or 36% predicted, FVC 2.60 L or 61% predicted, FEV1/FVC 42%, no bronchodilator response.  Lung volumes showed moderate air trapping.  Diffusion capacity severely decreased at 49%.    Review of Systems A 10 point review of systems was performed and it is as noted above otherwise negative.  Patient Active Problem List   Diagnosis Date Noted   DOE (dyspnea on exertion) 01/24/2021   Secondary hypercoagulable state ([place]) 12/19/2020   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   PAD (peripheral artery disease) ([place]) 11/08/2019   Paroxysmal atrial fibrillation ([place]) 09/02/2019   Stiffness of finger joint of left hand 04/06/2019   History of COVID-19 01/20/2019   Severe persistent asthma without complication 76/72/0947   Medication management 09/20/2018   Chronic obstructive pulmonary disease ([place]) 09/20/2018   Increased eosinophils in the blood 06/21/2018   Diffusion capacity of lung (dl), decreased 03/11/2018   COPD with acute exacerbation ([place]) 06/22/2017   Atrial flutter, paroxysmal ([place]) 10/17/2016   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/2016   PSVT (paroxysmal supraventricular tachycardia) ([place]) 10/17/2016   Ulcerative colitis with complication ([place]) 09/62/8366  Cervical stenosis of spinal canal 05/05/2016   Carotid stenosis 05/02/2016   Atherosclerosis of native arteries of extremity with intermittent claudication ([place]) 05/02/2016   Cramping of hands 01/10/2016    Bilateral carpal tunnel syndrome 12/06/2015   Diarrhea 06/09/2014   Centrilobular Emphysema 04/25/2014   Former smoker 04/25/2014   Tobacco abuse 04/25/2014   Social History   Tobacco Use   Smoking status: Former    Packs/day: 0.50    Years: 35.00    Total pack years: 17.50    Types: Cigarettes, Cigars    Quit date: 06/11/2011    Years since quitting: 10.1   Smokeless tobacco: Never   Tobacco comments:    Former smoker 12/19/2020  Substance Use Topics   Alcohol use: Yes    Alcohol/week: 6.0 standard drinks of alcohol    Types: 6 Shots of liquor per week   Allergies  Allergen Reactions   Penicillins Rash    Has patient had a PCN reaction causing immediate rash, facial/tongue/throat swelling, SOB or lightheadedness with hypotension:Yes Has patient had a PCN reaction causing severe rash involving mucus membranes or skin necrosis:Yes Has patient had a PCN reaction that required hospitalization:No Has patient had a PCN reaction occurring within the last 10 years:No If all of the above answers are "NO", then may proceed with Cephalosporin use.    Current Meds  Medication Sig   albuterol (PROVENTIL) (2.5 MG/3ML) 0.083% nebulizer solution Take 3 mLs (2.5 mg total) by nebulization every 6 (six) hours as needed for wheezing or shortness of breath.   albuterol (VENTOLIN HFA) 108 (90 Base) MCG/ACT inhaler Inhale 2 puffs into the lungs every 6 (six) hours as needed for wheezing or shortness of breath.   apixaban (ELIQUIS) 5 MG TABS tablet TAKE 1 TABLET BY MOUTH TWICE A DAY   azelastine (ASTELIN) 0.1 % nasal spray Place 1 spray into both nostrils 2 (two) times daily. Use in each nostril as directed   Budeson-Glycopyrrol-Formoterol (BREZTRI AEROSPHERE) 160-9-4.8 MCG/ACT AERO Inhale 2 puffs into the lungs in the morning and at bedtime.   dupilumab (DUPIXENT) 300 MG/2ML prefilled syringe Inject 300 mg into the skin every 14 (fourteen) days.   EPINEPHrine 0.3 mg/0.3 mL IJ SOAJ injection  Inject 0.3 mg into the muscle as needed for anaphylaxis.   hydrocortisone 2.5 % cream Apply 1 application. topically as needed.   ipratropium (ATROVENT) 0.02 % nebulizer solution Take 2.5 mLs (0.5 mg total) by nebulization 4 (four) times daily.   mesalamine (LIALDA) 1.2 g EC tablet TAKE 4 TABLETS BY MOUTH DAILY WITH BREAKFAST   metoprolol succinate (TOPROL XL) 25 MG 24 hr tablet Take 1 tablet (25 mg total) by mouth daily.   rosuvastatin (CRESTOR) 10 MG tablet Take 1 tablet (10 mg total) by mouth daily.   Immunization History  Administered Date(s) Administered   Fluad Quad(high Dose 65+) 11/01/2020   Influenza Inj Mdck Quad Pf 10/28/2017   Influenza Split 11/24/2013   Influenza, High Dose Seasonal PF 11/05/2016, 10/29/2017, 10/06/2018, 11/03/2019   Influenza,inj,Quad PF,6+ Mos 11/08/2014, 11/26/2015   Influenza-Unspecified 10/11/2013, 10/11/2013   PFIZER(Purple Top)SARS-COV-2 Vaccination 05/06/2019, 05/31/2019, 01/27/2020   Pneumococcal Conjugate-13 04/18/2014   Pneumococcal Polysaccharide-23 11/26/2015   Tdap 02/13/2011   Zoster Recombinat (Shingrix) 12/01/2016, 03/02/2017   Zoster, Live 11/07/2010       Objective:   Physical Exam BP 122/68 (BP Location: Left Arm, Cuff Size: Normal)   Pulse 78   Temp 97.7 F (36.5 C) (Temporal)   Ht 5' 10.5" (1.791 m)  Wt 149 lb 3.2 oz (67.7 kg)   SpO2 94%   BMI 21.11 kg/m  GENERAL: Thin well-developed gentleman, no acute distress.  Well-groomed, fully ambulatory.  No conversational dyspnea. HEAD: Normocephalic, atraumatic.  EYES: Pupils equal, round, reactive to light.  No scleral icterus.  MOUTH: Oral mucosa moist.  No thrush. NECK: Supple. No thyromegaly. Trachea midline. No JVD.  No adenopathy. PULMONARY: Good air entry bilaterally.  No adventitious sounds. CARDIOVASCULAR: S1 and S2.  No rubs, murmurs or gallops heard. ABDOMEN: Benign. MUSCULOSKELETAL: No joint deformity, no clubbing, no edema.  NEUROLOGIC: No overt focal deficit, no  gait disturbance, speech is fluent. SKIN: Intact,warm,dry. PSYCH: Mood and behavior normal.  Ambulatory oximetry performed today: No desaturations of oxygen noted during ambulation.  O2 sat nadir 97%     Assessment & Plan:     ICD-10-CM   1. Stage 3 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 (HCC)  J44.9    Continue Breztri 2 puffs twice a day Continue as needed albuterol Follow-up 4 months time call sooner for new issues    2. Severe persistent asthma without complication  Z61.09    Continue Dupixent twice a month     3. Nocturnal hypoxemia due to emphysema (HCC)  J43.9    G47.36    Compliant with oxygen at 2 L/min nocturnally Notes improvement on overall wellbeing and symptoms with therapy    4. Paroxysmal atrial fibrillation (HCC)  I48.0    Managed by cardiology On Eliquis This issue adds complexity to his management Adds to his issues with dyspnea     Patient appears to be doing well today.  Ambulatory oximetry was performed today and no desaturations were noted during ambulation.  He appears to have been in sinus rhythm today.  He has paroxysmal atrial fibrillation and tends to "flip in and out" is on Eliquis.  I suspect that his dyspnea may get worse during episodes of atrial fib.  He is being expertly managed by cardiology in this regard.  We will see the patient in follow-up in 4 months time he is to contact us prior to that time should any new difficulties arise.  Renold Don, MD Advanced Bronchoscopy PCCM [facility]    *This note was dictated using voice recognition software/Dragon.  Despite best efforts to proofread, errors can occur which can change the meaning. Any transcriptional errors that result from this process are unintentional and may not be fully corrected at the time of dictation.

## 2021-07-23 NOTE — Patient Instructions (Signed)
Continue taking your Dupixent and Breztri inhaler.  Continue using your oxygen at night.  You did well with your oxygen level while you walk today.  We will see you in follow-up in 4 months time call sooner should any new problems arise.

## 2021-08-16 ENCOUNTER — Other Ambulatory Visit: Payer: Self-pay | Admitting: Internal Medicine

## 2021-08-19 DIAGNOSIS — D485 Neoplasm of uncertain behavior of skin: Secondary | ICD-10-CM | POA: Diagnosis not present

## 2021-09-03 NOTE — Progress Notes (Unsigned)
Electrophysiology Office Note:    Date:  09/04/2021   ID:  Antonio [name], [place] Apr 14, 1945, MRN [id number]  PCP:  [name], MD  [facility] Cardiologist:  None  [facility] Electrophysiologist:  Vickie Epley, MD   Referring MD: Nelva Bush, MD   Chief Complaint: Atrial fibrillation  History of Present Illness:    Antonio [name] is a 76 y.o. male who presents for an evaluation of atrial fibrillation at the request of Dr. Saunders Revel. Their medical history includes COPD, peripheral arterial disease, paroxysmal atrial fibrillation, ulcerative colitis.  The patient saw Dr. Saunders Revel on Jul 03, 2021.  At that appointment he reported chronic shortness of breath with activity.  He takes Eliquis for stroke prophylaxis.  His rate control medications were adjusted at that appointment and he was referred to discuss rhythm control options.  He has severe COPD managed by Dr. Patsey Berthold and pulmonary.  He has experienced severe episodes of A-fib with RVR in the past.  He estimates 3 times since August 2020.  He has been in atrial fibrillation more than this though with a recent office visit EKG showing atrial fibrillation.  There is a thought that his A-fib is contributing to his respiratory troubles which I think is very plausible.  He takes blood thinner without any bleeding issues.    Past Medical History:  Diagnosis Date   Allergy    Calf pain    COPD (chronic obstructive pulmonary disease) (HCC)    no Oxygen per pt   COVID-19    Dyspnea    Hypokalemia    Internal hemorrhoids    Internal hemorrhoids    Muscle cramp    Muscle pain    PAD (peripheral artery disease) (HCC)    Paroxysmal atrial fibrillation ([place]) 08/2019   Pneumonia    PONV (postoperative nausea and vomiting)    Tubular adenoma of colon    Ulcerative colitis ([place])     Past Surgical History:  Procedure Laterality Date   ANTERIOR CERVICAL DECOMP/DISCECTOMY FUSION N/A 05/05/2016   Procedure: ANTERIOR  CERVICAL DECOMPRESSION FUSION CERVICAL THREE-FOUR.;  Surgeon: Earnie Larsson, MD;  Location: [facility];  Service: Neurosurgery;  Laterality: N/A;  right side approach   CATARACT EXTRACTION Bilateral    CERVICAL DISCECTOMY  04/2016   C3-C4   COLONOSCOPY     COLONOSCOPY  08/09/2015   HEMORRHOID SURGERY  1988   leg stent Left approx 5-6 yrs ago   2 to left leg   MENISCUS REPAIR Left 12/2013   MOHS SURGERY     POLYPECTOMY     ROTATOR CUFF REPAIR Left 2007   TONSILLECTOMY AND ADENOIDECTOMY  1957    Current Medications: Current Meds  Medication Sig   albuterol (PROVENTIL) (2.5 MG/3ML) 0.083% nebulizer solution Take 3 mLs (2.5 mg total) by nebulization every 6 (six) hours as needed for wheezing or shortness of breath.   albuterol (VENTOLIN HFA) 108 (90 Base) MCG/ACT inhaler Inhale 2 puffs into the lungs every 6 (six) hours as needed for wheezing or shortness of breath.   azelastine (ASTELIN) 0.1 % nasal spray Place 1 spray into both nostrils 2 (two) times daily. Use in each nostril as directed   Budeson-Glycopyrrol-Formoterol (BREZTRI AEROSPHERE) 160-9-4.8 MCG/ACT AERO Inhale 2 puffs into the lungs in the morning and at bedtime.   dupilumab (DUPIXENT) 300 MG/2ML prefilled syringe Inject 300 mg into the skin every 14 (fourteen) days.   hydrocortisone 2.5 % cream Apply 1 application. topically as needed.   ipratropium (  ATROVENT) 0.02 % nebulizer solution Take 2.5 mLs (0.5 mg total) by nebulization 4 (four) times daily.   mesalamine (LIALDA) 1.2 g EC tablet TAKE 4 TABLETS BY MOUTH DAILY WITH BREAKFAST   [DISCONTINUED] apixaban (ELIQUIS) 5 MG TABS tablet TAKE 1 TABLET BY MOUTH TWICE A DAY   [DISCONTINUED] metoprolol succinate (TOPROL XL) 25 MG 24 hr tablet Take 1 tablet (25 mg total) by mouth daily.   [DISCONTINUED] rosuvastatin (CRESTOR) 10 MG tablet Take 1 tablet (10 mg total) by mouth daily.     Allergies:   Penicillins   Social History   Socioeconomic History   Marital status: Married     Spouse name: Not on file   Number of children: 2   Years of education: Not on file   Highest education level: Bachelor's degree (e.g., BA, AB, BS)  Occupational History   Occupation: retired  Tobacco Use   Smoking status: Former    Packs/day: 0.50    Years: 35.00    Total pack years: 17.50    Types: Cigarettes, Cigars    Quit date: 06/11/2011    Years since quitting: 10.2   Smokeless tobacco: Never   Tobacco comments:    Former smoker 12/19/2020  Vaping Use   Vaping Use: Former  Substance and Sexual Activity   Alcohol use: Yes    Alcohol/week: 6.0 standard drinks of alcohol    Types: 6 Shots of liquor per week   Drug use: No   Sexual activity: Yes  Other Topics Concern   Not on file  Social History Narrative   Not on file   Social Determinants of Health   Financial Resource Strain: Low Risk  (04/16/2017)   Overall Financial Resource Strain (CARDIA)    Difficulty of Paying Living Expenses: Not hard at all  Food Insecurity: No Food Insecurity (04/16/2017)   Hunger Vital Sign    Worried About Running Out of Food in the Last Year: Never true    Ran Out of Food in the Last Year: Never true  Transportation Needs: No Transportation Needs (04/16/2017)   PRAPARE - Hydrologist (Medical): No    Lack of Transportation (Non-Medical): No  Physical Activity: Inactive (04/16/2017)   Exercise Vital Sign    Days of Exercise per Week: 0 days    Minutes of Exercise per Session: 0 min  Stress: No Stress Concern Present (04/16/2017)   [place]    Feeling of Stress : Not at all  Social Connections: Unknown (04/16/2017)   Social Connection and Isolation Panel [NHANES]    Frequency of Communication with Friends and Family: Patient refused    Frequency of Social Gatherings with Friends and Family: Patient refused    Attends Religious Services: Patient refused    Marine scientist or Organizations:  Patient refused    Attends Music therapist: Patient refused    Marital Status: Married     Family History: The patient's family history includes Cancer in his mother; Colon polyps in his mother; Healthy in his daughter and son; Heart attack (age of onset: 45) in his father; Heart disease in his father. There is no history of Colon cancer, Esophageal cancer, Rectal cancer, or Stomach cancer.  ROS:   Please see the history of present illness.    All other systems reviewed and are negative.  EKGs/Labs/Other Studies Reviewed:    The following studies were reviewed today:  Jul 09, 2021  SPECT Low risk study No ischemia  April 03, 2021 Severe COPD  March 11, 2021 echo EF is 55% Right ventricular function is normal  January 23, 2021 EKG personally reviewed shows sinus rhythm with a normal PR and QRS duration      Recent Labs: 07/03/2021: ALT 19; BUN 14; Creatinine, Ser 1.09; Hemoglobin 15.6; Magnesium 2.3; Platelets 247; Potassium 5.0; Sodium 141; TSH 1.783  Recent Lipid Panel    Component Value Date/Time   CHOL 175 12/17/2020 0940   TRIG 96 12/17/2020 0940   HDL 54 12/17/2020 0940   CHOLHDL 2.6 12/13/2018 0945   LDLCALC 103 (H) 12/17/2020 0940    Physical Exam:    VS:  BP 130/68   Pulse (!) 56   Ht 5' 10"  (1.778 m)   Wt 146 lb (66.2 kg)   BMI 20.95 kg/m     Wt Readings from Last 3 Encounters:  09/04/21 146 lb (66.2 kg)  07/23/21 149 lb 3.2 oz (67.7 kg)  07/03/21 144 lb (65.3 kg)     GEN:  Well nourished, well developed in no acute distress HEENT: Normal NECK: No JVD; No carotid bruits LYMPHATICS: No lymphadenopathy CARDIAC: RRR, no murmurs, rubs, gallops RESPIRATORY:  Clear to auscultation without rales, wheezing or rhonchi  ABDOMEN: Soft, non-tender, non-distended MUSCULOSKELETAL:  No edema; No deformity  SKIN: Warm and dry NEUROLOGIC:  Alert and oriented x 3 PSYCHIATRIC:  Normal affect       ASSESSMENT:    1. Paroxysmal atrial  fibrillation (HCC)   2. Atherosclerosis of native artery of left lower extremity with intermittent claudication ([place])   3. Chronic respiratory failure with hypoxia (HCC)    PLAN:    In order of problems listed above:  #Paroxysmal atrial fibrillation I do suspect his A-fib is contributing to his shortness of breath.  We discussed options for managing his atrial fibrillation during today's visit.  We discussed flecainide and Tikosyn.  He does not have any history of MI and has no baseline conduction abnormalities making flecainide a reasonable option.  I will start with flecainide 75 mg by mouth twice daily.  He will take this in addition to his Toprol-XL 25 mg by mouth daily and Eliquis 5 mg by mouth twice daily.  He will present for an EKG visit in 7 to 10 days and then follow-up with me in 6 to 8 weeks.  Given his severe lung disease, he is not a candidate for invasive EP procedures.  #Chronic hypoxic respiratory failure Amio is not a good option for managing his arrhythmia.  He does use oxygen at night.  I suspect his arrhythmia is contributing to some of his shortness of breath.  Medication Adjustments/Labs and Tests Ordered: Current medicines are reviewed at length with the patient today.  Concerns regarding medicines are outlined above.  No orders of the defined types were placed in this encounter.  Meds ordered this encounter  Medications   apixaban (ELIQUIS) 5 MG TABS tablet    Sig: Take 1 tablet (5 mg total) by mouth 2 (two) times daily.    Dispense:  180 tablet    Refill:  3    Order Specific Question:   Lot Number?    Answer:   [id number]    Order Specific Question:   Expiration Date?    Answer:   09/10/2022    Order Specific Question:   Manufacturer?    Answer:   [employer] [[id number]]    Order Specific Question:   Quantity  Answer:   56   metoprolol succinate (TOPROL XL) 25 MG 24 hr tablet    Sig: Take 1 tablet (25 mg total) by mouth daily.    Dispense:  90 tablet    Refill:   0   rosuvastatin (CRESTOR) 10 MG tablet    Sig: Take 1 tablet (10 mg total) by mouth daily.    Dispense:  90 tablet    Refill:  3     Signed, Lysbeth Galas T. Quentin Ore, MD, [facility] 09/04/2021 11:05 AM    Electrophysiology [facility]

## 2021-09-04 ENCOUNTER — Ambulatory Visit: Payer: PPO | Admitting: Cardiology

## 2021-09-04 ENCOUNTER — Encounter: Payer: Self-pay | Admitting: Cardiology

## 2021-09-04 VITALS — BP 130/68 | HR 56 | Ht 70.0 in | Wt 146.0 lb

## 2021-09-04 DIAGNOSIS — I480 Paroxysmal atrial fibrillation: Secondary | ICD-10-CM | POA: Diagnosis not present

## 2021-09-04 DIAGNOSIS — I70212 Atherosclerosis of native arteries of extremities with intermittent claudication, left leg: Secondary | ICD-10-CM

## 2021-09-04 DIAGNOSIS — J9611 Chronic respiratory failure with hypoxia: Secondary | ICD-10-CM

## 2021-09-04 MED ORDER — FLECAINIDE ACETATE 50 MG PO TABS: 75.0000 mg | ORAL_TABLET | Freq: Two times a day (BID) | ORAL | 3 refills | Status: DC

## 2021-09-04 MED ORDER — APIXABAN 5 MG PO TABS: 5.0000 mg | ORAL_TABLET | Freq: Two times a day (BID) | ORAL | 3 refills | Status: DC

## 2021-09-04 MED ORDER — METOPROLOL SUCCINATE ER 25 MG PO TB24: 25.0000 mg | ORAL_TABLET | Freq: Every day | ORAL | 0 refills | Status: DC

## 2021-09-04 MED ORDER — ROSUVASTATIN CALCIUM 10 MG PO TABS: 10.0000 mg | ORAL_TABLET | Freq: Every day | ORAL | 3 refills | Status: DC

## 2021-09-04 NOTE — Patient Instructions (Addendum)
Medications: Start Flecainide 75 mg two times a day (1.5 tablets) Your physician recommends that you continue on your current medications as directed. Please refer to the Current Medication list given to you today. *If you need a refill on your cardiac medications before your next appointment, please call your pharmacy*  Lab Work: None. If you have labs (blood work) drawn today and your tests are completely normal, you will receive your results only by: [place] (if you have MyChart) OR A paper copy in the mail If you have any lab test that is abnormal or we need to change your treatment, we will call you to review the results.  Testing/Procedures: None.  Follow-Up: At [facility], you and your health needs are our priority.  As part of our continuing mission to provide you with exceptional heart care, we have created designated Provider Care Teams.  These Care Teams include your primary Cardiologist (physician) and Advanced Practice Providers (APPs -  Physician Assistants and Nurse Practitioners) who all work together to provide you with the care you need, when you need it.  Your physician wants you to follow-up in: Nurse visit in 7-10 day with EKG and 6-8 weeks with Lars Mage, MD   We recommend signing up for the patient portal called "MyChart".  Sign up information is provided on this After Visit Summary.  MyChart is used to connect with patients for Virtual Visits (Telemedicine).  Patients are able to view lab/test results, encounter notes, upcoming appointments, etc.  Non-urgent messages can be sent to your provider as well.   To learn more about what you can do with MyChart, go to [email or web address].    Any Other Special Instructions Will Be Listed Below (If Applicable).  [place]  Patient Assistance Programs  If you cannot afford one of the medications listed on the next page, please contact the patient assistance program at the  phone number listed beside the medication you need assistance with.  Please ask any questions you have concerning their medication assistance program, and your eligibility for their assistance program.  In most cases, if you are approved you will receive your medication free of charge for the remainder of the year that you are applying in.  If it seems likely you would be approved, please ask them to mail an application to your home address (not faxed to the office).  You may also obtain an application by visiting the patient assistance program at the website listed beside the medication you need assistance with.  Once you receive the application, please complete your part of the application, obtain any documents required by the assistance program you are applying with (please see instruction page of the application for required documents), bring all paperwork to your Cardiology Provider's office for drop off and will we take care of the provider page of your application and fax to the appropriate patient assistance program you are applying with.  If you have any questions for [place] along the way, please reach out to [place] and we will assist you to the best of our ability.  We do ask that you allow [place] an adequate amount of time to get your application ready to be faxed to the [place] you are applying with.  You may call the Foundation you applied with to check the progress of your application at any time.  If you decide you do not want to apply for patient assistance, or if you are denied for patient assistance and  cannot afford your medication, please contact your cardiologist's office so we can discuss switching you to a medication that is more affordable for you.  Taking your medications as prescribed/directed by your Cardiologist for your heart condition is extremely important for your heart health.  ELIQUIS: [place] (BMSPAF) [phone] or online  at Patient [place] (ResearchName.uy) to print an application (Requirements and instructions will be included with the application).  Alveda ReasonsWynetta Emery and [place] (J&J) [phone] or online at Patient Assistance Program Application (TripDoors.com.cy). To print an application (Requirements and instructions will be included with the application). and Engineer, maintenance at [phone] or online at [employer] for [employer] (rivaroxaban) for more information. BRILINTA and FARXIGA: [place] ([place] and [place]) [phone] or Download an Application  AZ&MeT Prescription Savings Program (https://tucker.info/) (Requirements and instructions for applying will be included with the application)  ENTRESTO-Novartis Patient Assistance Foundation [phone] or go online to print an application at [employer] Patient assistance [employer] (Requirements and instructions will be included with the application).  PRADAXA and JARDIANCE: [place] (Obion) [phone] or online at AutoNation.pdf (boehringer-ingelheim.[place]) to print an application (Requirements and instructions will be included with the application).  MULTAQ: Sanofi Patient Connects [phone] or download our application or visit www.sanofipatientconnection.com to see the complete list of eligibility requirements and to print an application. TIKOSYN: [place] [phone] or go online at Freeport[employer]McMoRan Copper & Gold.pdf (multiscreensite.com) to print an application (Requirements and instructions will be included with the application). CORLANORN[place] [phone] or go online at AMGEN-SNF-Application-Prescription-Editable.pdf (amgensafetynetfoundation.com) to print an application (Requirements and instructions will be included with the application).  CADUET: [place] [phone] or go online at [place].pdf to print an application  (Requirements and instructions will be included with the application).

## 2021-09-06 DIAGNOSIS — R208 Other disturbances of skin sensation: Secondary | ICD-10-CM | POA: Diagnosis not present

## 2021-09-06 DIAGNOSIS — L738 Other specified follicular disorders: Secondary | ICD-10-CM | POA: Diagnosis not present

## 2021-09-06 DIAGNOSIS — Z789 Other specified health status: Secondary | ICD-10-CM | POA: Diagnosis not present

## 2021-09-06 DIAGNOSIS — D485 Neoplasm of uncertain behavior of skin: Secondary | ICD-10-CM | POA: Diagnosis not present

## 2021-09-06 DIAGNOSIS — L538 Other specified erythematous conditions: Secondary | ICD-10-CM | POA: Diagnosis not present

## 2021-09-09 DIAGNOSIS — H43813 Vitreous degeneration, bilateral: Secondary | ICD-10-CM | POA: Diagnosis not present

## 2021-09-11 ENCOUNTER — Telehealth: Payer: Self-pay | Admitting: Cardiology

## 2021-09-11 ENCOUNTER — Ambulatory Visit (INDEPENDENT_AMBULATORY_CARE_PROVIDER_SITE_OTHER): Payer: PPO | Admitting: *Deleted

## 2021-09-11 VITALS — BP 124/58 | HR 59 | Ht 71.0 in | Wt 147.0 lb

## 2021-09-11 DIAGNOSIS — I480 Paroxysmal atrial fibrillation: Secondary | ICD-10-CM | POA: Diagnosis not present

## 2021-09-11 NOTE — Telephone Encounter (Signed)
Patient in the office today for an EKG per Dr. Quentin Ore- new Flecainide start on 09/04/21.  Confirmed that the patient has been taking Eliquis 5 mg BID. The patient inquired if the baby ASA could just be used in place of the Eliquis. We have reviewed the reason for using eliquis due to stroke risk vs just a baby aspirin. The patient was concerned about cost and may be interested in trying to get this from [place] as he is nearing the donut hole. I advised him that if he finds a [place] that he would like to use, then would could only supply him with a paper RX, which he understood.  The patient then proceeded to tell me that he forgets to take the 2nd dose of Eliquis at least 1-2 times per week. He is aware that this can still increase his stroke risk by missing doses.  I advised the patient that he may be a better candidate for Xarelto due to the fact that this is a once daily dosing. The patient is aware we will review with Dr. Quentin Ore and call him back with further recommendations.

## 2021-09-11 NOTE — Progress Notes (Addendum)
   Nurse Visit   Date of Encounter: 09/11/2021 ID: Hillery Bhalla, [place] [birth year], MRN [id number]  PCP:  [name], MD   [facility] Providers Cardiologist:  Nelva Bush, MD Electrophysiologist:  Vickie Epley, MD      Visit Details   VS:  BP (!) 124/58 (BP Location: Left Arm, Patient Position: Sitting, Cuff Size: Normal)   Pulse (!) 59   Ht 5' 11"  (1.803 m)   Wt 147 lb (66.7 kg)   BMI 20.50 kg/m  , BMI Body mass index is 20.5 kg/m.  Wt Readings from Last 3 Encounters:  09/11/21 147 lb (66.7 kg)  09/04/21 146 lb (66.2 kg)  07/23/21 149 lb 3.2 oz (67.7 kg)     Reason for visit: EKG (for new flecainide start)  Performed today: EKG (Sinus Bradycardia)/ BP/ Weight/ Medication Review/ DOD (Dr. Rockey Situ) evaluation of the EKG Changes (medications, testing, etc.) : No Changes Length of Visit: 15 minutes  EKG today read "suspect arm lead reversal, interpretation assumes no reversal." This RN verified no arm reversal with lead placement while the patient was still connected.   Medications Adjustments/Labs and Tests Ordered: No new orders/ recommendations at this visit.  Separate phone note routed to Dr. Maura Crandall, RN regarding missed doses of Eliquis and if Xarelto would be a more appropriate option for the patient.   Signed, Alvis Lemmings, RN  09/11/2021 10:38 AM

## 2021-09-11 NOTE — Patient Instructions (Addendum)
Medication Instructions:  - Your physician recommends that you continue on your current medications as directed. Please refer to the Current Medication list given to you today.  *If you need a refill on your cardiac medications before your next appointment, please call your pharmacy*   Lab Work: - none ordered  If you have labs (blood work) drawn today and your tests are completely normal, you will receive your results only by: [place] (if you have MyChart) OR A paper copy in the mail If you have any lab test that is abnormal or we need to change your treatment, we will call you to review the results.   Testing/Procedures: - none ordered   Follow-Up: At [facility], you and your health needs are our priority.  As part of our continuing mission to provide you with exceptional heart care, we have created designated Provider Care Teams.  These Care Teams include your primary Cardiologist (physician) and Advanced Practice Providers (APPs -  Physician Assistants and Nurse Practitioners) who all work together to provide you with the care you need, when you need it.  We recommend signing up for the patient portal called "MyChart".  Sign up information is provided on this After Visit Summary.  MyChart is used to connect with patients for Virtual Visits (Telemedicine).  Patients are able to view lab/test results, encounter notes, upcoming appointments, etc.  Non-urgent messages can be sent to your provider as well.   To learn more about what you can do with MyChart, go to [email or web address].    Your next appointment:   As scheduled   The format for your next appointment:   In Person  Provider:   Lars Mage, MD    Other Instructions N/a  Important Information About Sugar

## 2021-09-13 MED ORDER — RIVAROXABAN 20 MG PO TABS: 20.0000 mg | ORAL_TABLET | Freq: Every day | ORAL | 3 refills | Status: DC

## 2021-09-13 NOTE — Addendum Note (Signed)
Addended by: Darrell Jewel on: 09/13/2021 07:53 AM   Modules accepted: Orders

## 2021-09-13 NOTE — Telephone Encounter (Addendum)
Will start Xarelto 20 mg daily. Stop Eliquis.  Patient verbalized understanding and agreement.

## 2021-09-15 ENCOUNTER — Other Ambulatory Visit: Payer: Self-pay | Admitting: Internal Medicine

## 2021-09-30 ENCOUNTER — Other Ambulatory Visit: Payer: Self-pay | Admitting: Internal Medicine

## 2021-10-10 ENCOUNTER — Ambulatory Visit: Payer: PPO | Admitting: Internal Medicine

## 2021-10-18 ENCOUNTER — Encounter: Payer: Self-pay | Admitting: Internal Medicine

## 2021-10-29 ENCOUNTER — Ambulatory Visit (INDEPENDENT_AMBULATORY_CARE_PROVIDER_SITE_OTHER): Payer: PPO

## 2021-10-29 DIAGNOSIS — Z23 Encounter for immunization: Secondary | ICD-10-CM

## 2021-10-29 NOTE — Progress Notes (Unsigned)
Electrophysiology Office Follow up Visit Note:    Date:  10/30/2021   ID:  Antonio [name], Antonio [name] 11, 1947, MRN [id number]  PCP:  [name], MD  [facility] Cardiologist:  None  [facility] Electrophysiologist:  Vickie Epley, MD    Interval History:    Antonio [name] is a 76 y.o. male who presents for a follow up visit. They were last seen in clinic 09/04/2021 for pAF. At the last appt, flecainide was started in addition to his already prescribed toprol. Given his severe lung disease he is not a candidate for invasive EP procedures.  The patient was also transitioned to Xarelto after our last appointment given his inability to consistently take the second daily dose of eliquis.  He is here with his wife today in clinic.  He is doing well.  He has new off target effects of flecainide.  He is continuing to take Eliquis and assures me today that he is taking it twice daily.  He is planning to transition to once daily Xarelto when he runs out of his Eliquis.      Past Medical History:  Diagnosis Date   Allergy    Calf pain    COPD (chronic obstructive pulmonary disease) (HCC)    no Oxygen per pt   COVID-19    Dyspnea    Hypokalemia    Internal hemorrhoids    Internal hemorrhoids    Muscle cramp    Muscle pain    PAD (peripheral artery disease) (HCC)    Paroxysmal atrial fibrillation ([place]) 08/2019   Pneumonia    PONV (postoperative nausea and vomiting)    Tubular adenoma of colon    Ulcerative colitis ([place])     Past Surgical History:  Procedure Laterality Date   ANTERIOR CERVICAL DECOMP/DISCECTOMY FUSION N/A 05/05/2016   Procedure: ANTERIOR CERVICAL DECOMPRESSION FUSION CERVICAL THREE-FOUR.;  Surgeon: Earnie Larsson, MD;  Location: [place];  Service: Neurosurgery;  Laterality: N/A;  right side approach   CATARACT EXTRACTION Bilateral    CERVICAL DISCECTOMY  04/2016   C3-C4   COLONOSCOPY     COLONOSCOPY  08/09/2015   HEMORRHOID SURGERY  1988   leg  stent Left approx 5-6 yrs ago   2 to left leg   MENISCUS REPAIR Left 12/2013   MOHS SURGERY     POLYPECTOMY     ROTATOR CUFF REPAIR Left 2007   TONSILLECTOMY AND ADENOIDECTOMY  1957    Current Medications: Current Meds  Medication Sig   albuterol (PROVENTIL) (2.5 MG/3ML) 0.083% nebulizer solution Take 3 mLs (2.5 mg total) by nebulization every 6 (six) hours as needed for wheezing or shortness of breath.   albuterol (VENTOLIN HFA) 108 (90 Base) MCG/ACT inhaler Inhale 2 puffs into the lungs every 6 (six) hours as needed for wheezing or shortness of breath.   azelastine (ASTELIN) 0.1 % nasal spray Place 1 spray into both nostrils 2 (two) times daily. Use in each nostril as directed   Budeson-Glycopyrrol-Formoterol (BREZTRI AEROSPHERE) 160-9-4.8 MCG/ACT AERO Inhale 2 puffs into the lungs in the morning and at bedtime.   dupilumab (DUPIXENT) 300 MG/2ML prefilled syringe Inject 300 mg into the skin every 14 (fourteen) days.   EPINEPHrine 0.3 mg/0.3 mL IJ SOAJ injection Inject 0.3 mg into the muscle as needed for anaphylaxis.   flecainide (TAMBOCOR) 50 MG tablet Take 1.5 tablets (75 mg total) by mouth 2 (two) times daily.   hydrocortisone 2.5 % cream Apply 1 application. topically as needed.  ipratropium (ATROVENT) 0.02 % nebulizer solution Take 2.5 mLs (0.5 mg total) by nebulization 4 (four) times daily.   mesalamine (LIALDA) 1.2 g EC tablet TAKE 4 TABLETS BY MOUTH DAILY WITH BREAKFAST   metoprolol succinate (TOPROL-XL) 25 MG 24 hr tablet TAKE 1 TABLET (25 MG TOTAL) BY MOUTH DAILY.   rosuvastatin (CRESTOR) 10 MG tablet Take 1 tablet (10 mg total) by mouth daily.     Allergies:   Penicillins   Social History   Socioeconomic History   Marital status: Married    Spouse name: Not on file   Number of children: 2   Years of education: Not on file   Highest education level: Bachelor's degree (e.g., BA, AB, BS)  Occupational History   Occupation: retired  Tobacco Use   Smoking status:  Former    Packs/day: 0.50    Years: 35.00    Total pack years: 17.50    Types: Cigarettes, Cigars    Quit date: 06/11/2011    Years since quitting: 10.3   Smokeless tobacco: Never   Tobacco comments:    Former smoker 12/19/2020  Vaping Use   Vaping Use: Former  Substance and Sexual Activity   Alcohol use: Yes    Alcohol/week: 6.0 standard drinks of alcohol    Types: 6 Shots of liquor per week   Drug use: No   Sexual activity: Yes  Other Topics Concern   Not on file  Social History Narrative   Not on file   Social Determinants of Health   Financial Resource Strain: Low Risk  (04/16/2017)   Overall Financial Resource Strain (CARDIA)    Difficulty of Paying Living Expenses: Not hard at all  Food Insecurity: No Food Insecurity (04/16/2017)   Hunger Vital Sign    Worried About Running Out of Food in the Last Year: Never true    Ran Out of Food in the Last Year: Never true  Transportation Needs: No Transportation Needs (04/16/2017)   PRAPARE - Hydrologist (Medical): No    Lack of Transportation (Non-Medical): No  Physical Activity: Inactive (04/16/2017)   Exercise Vital Sign    Days of Exercise per Week: 0 days    Minutes of Exercise per Session: 0 min  Stress: No Stress Concern Present (04/16/2017)   [place]    Feeling of Stress : Not at all  Social Connections: Unknown (04/16/2017)   Social Connection and Isolation Panel [NHANES]    Frequency of Communication with Friends and Family: Patient refused    Frequency of Social Gatherings with Friends and Family: Patient refused    Attends Religious Services: Patient refused    Marine scientist or Organizations: Patient refused    Attends Music therapist: Patient refused    Marital Status: Married     Family History: The patient's family history includes Cancer in his mother; Colon polyps in his mother; Healthy in his  daughter and son; Heart attack (age of onset: 53) in his father; Heart disease in his father. There is no history of Colon cancer, Esophageal cancer, Rectal cancer, or Stomach cancer.  ROS:   Please see the history of present illness.    All other systems reviewed and are negative.  EKGs/Labs/Other Studies Reviewed:    The following studies were reviewed today:   09/17/2021 ECG - PR 212, QRS dur 23m 07/03/2021 ECG - AF  EKG:  The ekg ordered today demonstrates sinus  rhythm.  PR interval 210 ms.  QRS duration 86 ms.  These are stable from prior EKG.  Recent Labs: 07/03/2021: ALT 19; BUN 14; Creatinine, Ser 1.09; Hemoglobin 15.6; Magnesium 2.3; Platelets 247; Potassium 5.0; Sodium 141; TSH 1.783  Recent Lipid Panel    Component Value Date/Time   CHOL 175 12/17/2020 0940   TRIG 96 12/17/2020 0940   HDL 54 12/17/2020 0940   CHOLHDL 2.6 12/13/2018 0945   LDLCALC 103 (H) 12/17/2020 0940    Physical Exam:    VS:  BP 118/62   Pulse (!) 59   Ht 5' 10"  (1.778 m)   Wt 149 lb (67.6 kg)   BMI 21.38 kg/m     Wt Readings from Last 3 Encounters:  10/30/21 149 lb (67.6 kg)  09/11/21 147 lb (66.7 kg)  09/04/21 146 lb (66.2 kg)     GEN:  Well nourished, well developed in no acute distress HEENT: Normal NECK: No JVD; No carotid bruits LYMPHATICS: No lymphadenopathy CARDIAC: RRR, no murmurs, rubs, gallops RESPIRATORY:  Clear to auscultation without rales, wheezing or rhonchi  ABDOMEN: Soft, non-tender, non-distended MUSCULOSKELETAL:  No edema; No deformity  SKIN: Warm and dry NEUROLOGIC:  Alert and oriented x 3 PSYCHIATRIC:  Normal affect        ASSESSMENT:    1. Paroxysmal atrial fibrillation (HCC)   2. Chronic obstructive pulmonary disease, unspecified COPD type ([place])   3. Encounter for long-term (current) use of high-risk medication    PLAN:    In order of problems listed above:   #pAF Continue flecainide 75 mg by mouth twice daily.  EKG shows stable PR and QRS  duration.  Continue anticoagulation, plan for Xarelto once daily after he runs out of his Eliquis supply.  #COPD   Plan to follow-up in 6 months with APP.  Sooner as needed.    Medication Adjustments/Labs and Tests Ordered: Current medicines are reviewed at length with the patient today.  Concerns regarding medicines are outlined above.  Orders Placed This Encounter  Procedures   EKG 12-Lead   No orders of the defined types were placed in this encounter.    Signed, Lars Mage, MD, [facility], [facility] 10/30/2021 5:50 PM    Electrophysiology [facility]

## 2021-10-30 ENCOUNTER — Encounter: Payer: Self-pay | Admitting: Cardiology

## 2021-10-30 ENCOUNTER — Ambulatory Visit: Payer: PPO | Attending: Cardiology | Admitting: Cardiology

## 2021-10-30 VITALS — BP 118/62 | HR 59 | Ht 70.0 in | Wt 149.0 lb

## 2021-10-30 DIAGNOSIS — J449 Chronic obstructive pulmonary disease, unspecified: Secondary | ICD-10-CM | POA: Diagnosis not present

## 2021-10-30 DIAGNOSIS — I480 Paroxysmal atrial fibrillation: Secondary | ICD-10-CM | POA: Diagnosis not present

## 2021-10-30 DIAGNOSIS — Z79899 Other long term (current) drug therapy: Secondary | ICD-10-CM

## 2021-10-30 NOTE — Patient Instructions (Signed)
Medication Instructions:  none *If you need a refill on your cardiac medications before your next appointment, please call your pharmacy*   Lab Work: none If you have labs (blood work) drawn today and your tests are completely normal, you will receive your results only by: [place] (if you have MyChart) OR A paper copy in the mail If you have any lab test that is abnormal or we need to change your treatment, we will call you to review the results.   Testing/Procedures: None    Follow-Up: At [facility], you and your health needs are our priority.  As part of our continuing mission to provide you with exceptional heart care, we have created designated Provider Care Teams.  These Care Teams include your primary Cardiologist (physician) and Advanced Practice Providers (APPs -  Physician Assistants and Nurse Practitioners) who all work together to provide you with the care you need, when you need it.  We recommend signing up for the patient portal called "MyChart".  Sign up information is provided on this After Visit Summary.  MyChart is used to connect with patients for Virtual Visits (Telemedicine).  Patients are able to view lab/test results, encounter notes, upcoming appointments, etc.  Non-urgent messages can be sent to your provider as well.   To learn more about what you can do with MyChart, go to [email or web address].    Your next appointment:   1 year(s)  The format for your next appointment:   In Person  Provider:   You will see one of the following Advanced Practice Providers on your designated Care Team:   Murray Hodgkins, NP Christell Faith, PA-C Cadence Kathlen Mody, PA-C Gerrie Nordmann, NP      Other Instructions None   Important Information About [place]

## 2021-11-18 ENCOUNTER — Ambulatory Visit: Payer: PPO | Admitting: Pulmonary Disease

## 2021-11-18 ENCOUNTER — Encounter: Payer: Self-pay | Admitting: Pulmonary Disease

## 2021-11-18 VITALS — BP 110/78 | HR 61 | Temp 97.5°F | Ht 70.0 in | Wt 150.8 lb

## 2021-11-18 DIAGNOSIS — L538 Other specified erythematous conditions: Secondary | ICD-10-CM | POA: Diagnosis not present

## 2021-11-18 DIAGNOSIS — Z08 Encounter for follow-up examination after completed treatment for malignant neoplasm: Secondary | ICD-10-CM | POA: Diagnosis not present

## 2021-11-18 DIAGNOSIS — Z85828 Personal history of other malignant neoplasm of skin: Secondary | ICD-10-CM | POA: Diagnosis not present

## 2021-11-18 DIAGNOSIS — X32XXXA Exposure to sunlight, initial encounter: Secondary | ICD-10-CM | POA: Diagnosis not present

## 2021-11-18 DIAGNOSIS — R0609 Other forms of dyspnea: Secondary | ICD-10-CM

## 2021-11-18 DIAGNOSIS — L218 Other seborrheic dermatitis: Secondary | ICD-10-CM | POA: Diagnosis not present

## 2021-11-18 DIAGNOSIS — J4550 Severe persistent asthma, uncomplicated: Secondary | ICD-10-CM | POA: Diagnosis not present

## 2021-11-18 DIAGNOSIS — G4736 Sleep related hypoventilation in conditions classified elsewhere: Secondary | ICD-10-CM

## 2021-11-18 DIAGNOSIS — J449 Chronic obstructive pulmonary disease, unspecified: Secondary | ICD-10-CM

## 2021-11-18 DIAGNOSIS — L820 Inflamed seborrheic keratosis: Secondary | ICD-10-CM | POA: Diagnosis not present

## 2021-11-18 DIAGNOSIS — L570 Actinic keratosis: Secondary | ICD-10-CM | POA: Diagnosis not present

## 2021-11-18 DIAGNOSIS — J439 Emphysema, unspecified: Secondary | ICD-10-CM

## 2021-11-18 DIAGNOSIS — Z4802 Encounter for removal of sutures: Secondary | ICD-10-CM | POA: Diagnosis not present

## 2021-11-18 LAB — POCT EXHALED NITRIC OXIDE: FeNO level (ppb): 25

## 2021-11-18 MED ORDER — TRELEGY ELLIPTA 200-62.5-25 MCG/ACT IN AEPB: 1.0000 | INHALATION_SPRAY | Freq: Every day | RESPIRATORY_TRACT | 0 refills | Status: DC

## 2021-11-18 NOTE — Patient Instructions (Signed)
We will send a referral to the pulmonary rehab.  We have switched your inhaler to Trelegy Ellipta 200, 1 puff daily.  Let us know if this medication dose or does not work for you.  In either circumstance we need to know.  We will see you in follow-up in 6 to 8 weeks time call sooner should any new problems arise.

## 2021-11-18 NOTE — Progress Notes (Signed)
Subjective:    Patient ID: Antonio [name], male    DOB: 11/10/1945, 76 y.o.   MRN: [id number] Patient Care Team: Duanne Limerick, MD as PCP - General (Family Medicine) Lanier Prude, MD as PCP - Electrophysiology (Cardiology) Pyrtle, Carie Caddy, MD as Consulting Physician (Gastroenterology) Salena Saner, MD as Consulting Physician (Pulmonary Disease)  Chief Complaint  Patient presents with   Follow-up    COPD. SOB with exertion. No cough or wheezing.    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  He was noted to have asthma with allergic phenotype and elevated IgE and peripheral eosinophilia.  He was started on Dupixent on September 2020.  He has history significant for coronary artery disease and paroxysmal atrial fibrillation he is on Eliquis for anticoagulation.  He follows here after his visit of 23 July 2021 this is a scheduled visit.  Since his prior visit he has noted increasing shortness of breath on exertion.  He is currently maintained on Breztri 2 puffs twice a day and as needed albuterol.  He has been using his albuterol more frequently.  He also has nocturnal hypoxemia due to emphysema and is on nocturnal oxygen at 2 L/min and notes that this has had a very positive impact on his health and wellbeing.  He does not endorse any fevers, chills or sweats.  He has not had any cough or sputum production.  No hemoptysis.  No weight loss or anorexia.  No orthopnea, paroxysmal nocturnal dyspnea lower extremity edema nor calf tenderness.  No chest pain.  No other symptomatology endorsed.  DATA 06/09/2014 pulmonary function test> ratio 51%, FEV1 1.47 L (40% predicted), total lung capacity 5.43 L (71% predicted). DLCO 16.26 (44% predicted) December 2017 lung function testing showed ratio 50%, FEV1 1.47 L 43% predicted, FVC 2.96 L 67% predicted March 2020 pulmonary function  testing ratio 38%, FEV1 0.97 L 31% predicted, total lung capacity 8.44 L 119% predicted, residual volume 198% predicted, DLCO 14.0 55% predicted March 2020 high-resolution CT scan of the chest images : Centrilobular emphysema, no interstitial lung disease, aortic atherosclerosis 04/20/2018-eosinophils relative 7.3, eosinophils absolute 0.6 04/20/2018-IgE-67 01/20/2019-SARS-CoV-2-detected 03/06/2021 alpha 1 antitrypsin: Phenotype MM, level 165 03/11/2021 echocardiogram: LVEF 55%, right ventricular function normal.  No wall motion abnormalities. 04/03/2021 PFT: FEV1 1.09 L or 36% predicted, FVC 2.60 L or 61% predicted, FEV1/FVC 42%, no bronchodilator response.  Lung volumes showed moderate air trapping.  Diffusion capacity severely decreased at 49%.    Review of Systems A 10 point review of systems was performed and it is as noted above otherwise negative.  Patient Active Problem List   Diagnosis Date Noted   DOE (dyspnea on exertion) 01/24/2021   Secondary hypercoagulable state (HCC) 12/19/2020   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   PAD (peripheral artery disease) (HCC) 11/08/2019   Paroxysmal atrial fibrillation (HCC) 09/02/2019   Stiffness of finger joint of left hand 04/06/2019   History of COVID-19 01/20/2019   Severe persistent asthma without complication 01/20/2019   Medication management 09/20/2018   Chronic obstructive pulmonary disease (HCC) 09/20/2018   Increased eosinophils in the blood 06/21/2018   Diffusion capacity of lung (dl), decreased 40/98/1191   COPD with acute exacerbation (HCC) 06/22/2017   Atrial flutter, paroxysmal (HCC) 10/17/2016   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/2016   PSVT (paroxysmal supraventricular tachycardia) 10/17/2016   Ulcerative colitis with complication (HCC) 05/28/2016   Cervical stenosis of  spinal canal 05/05/2016   Carotid stenosis 05/02/2016   Atherosclerosis of native arteries of extremity with intermittent claudication (HCC) 05/02/2016   Cramping of hands  01/10/2016   Bilateral carpal tunnel syndrome 12/06/2015   Diarrhea 06/09/2014   Centrilobular Emphysema 04/25/2014   Former smoker 04/25/2014   Tobacco abuse 04/25/2014   Social History   Tobacco Use   Smoking status: Former    Packs/day: 0.50    Years: 35.00    Total pack years: 17.50    Types: Cigarettes, Cigars    Quit date: 06/11/2011    Years since quitting: 10.4   Smokeless tobacco: Never   Tobacco comments:    Former smoker 12/19/2020  Substance Use Topics   Alcohol use: Yes    Alcohol/week: 6.0 standard drinks of alcohol    Types: 6 Shots of liquor per week   Allergies  Allergen Reactions   Penicillins Rash    Has patient had a PCN reaction causing immediate rash, facial/tongue/throat swelling, SOB or lightheadedness with hypotension:Yes Has patient had a PCN reaction causing severe rash involving mucus membranes or skin necrosis:Yes Has patient had a PCN reaction that required hospitalization:No Has patient had a PCN reaction occurring within the last 10 years:No If all of the above answers are "NO", then may proceed with Cephalosporin use.    Current Meds  Medication Sig   albuterol (PROVENTIL) (2.5 MG/3ML) 0.083% nebulizer solution Take 3 mLs (2.5 mg total) by nebulization every 6 (six) hours as needed for wheezing or shortness of breath.   albuterol (VENTOLIN HFA) 108 (90 Base) MCG/ACT inhaler Inhale 2 puffs into the lungs every 6 (six) hours as needed for wheezing or shortness of breath.   azelastine (ASTELIN) 0.1 % nasal spray Place 1 spray into both nostrils 2 (two) times daily. Use in each nostril as directed   Budeson-Glycopyrrol-Formoterol (BREZTRI AEROSPHERE) 160-9-4.8 MCG/ACT AERO Inhale 2 puffs into the lungs in the morning and at bedtime.   dupilumab (DUPIXENT) 300 MG/2ML prefilled syringe Inject 300 mg into the skin every 14 (fourteen) days.   flecainide (TAMBOCOR) 50 MG tablet Take 1.5 tablets (75 mg total) by mouth 2 (two) times daily.    hydrocortisone 2.5 % cream Apply 1 application. topically as needed.   ipratropium (ATROVENT) 0.02 % nebulizer solution Take 2.5 mLs (0.5 mg total) by nebulization 4 (four) times daily.   mesalamine (LIALDA) 1.2 g EC tablet TAKE 4 TABLETS BY MOUTH DAILY WITH BREAKFAST   metoprolol succinate (TOPROL-XL) 25 MG 24 hr tablet TAKE 1 TABLET (25 MG TOTAL) BY MOUTH DAILY.   rivaroxaban (XARELTO) 20 MG TABS tablet Take 1 tablet (20 mg total) by mouth daily with supper.   rosuvastatin (CRESTOR) 10 MG tablet Take 1 tablet (10 mg total) by mouth daily.   Immunization History  Administered Date(s) Administered   Fluad Quad(high Dose 65+) 11/01/2020, 10/29/2021   Influenza Inj Mdck Quad Pf 10/28/2017   Influenza Split 11/24/2013   Influenza, High Dose Seasonal PF 11/05/2016, 10/29/2017, 10/06/2018, 11/03/2019   Influenza,inj,Quad PF,6+ Mos 11/08/2014, 11/26/2015   Influenza-Unspecified 10/11/2013, 10/11/2013   PFIZER(Purple Top)SARS-COV-2 Vaccination 05/06/2019, 05/31/2019, 01/27/2020   Pneumococcal Conjugate-13 04/18/2014   Pneumococcal Polysaccharide-23 11/26/2015   Tdap 02/13/2011   Zoster Recombinat (Shingrix) 12/01/2016, 03/02/2017   Zoster, Live 11/07/2010       Objective:   Physical Exam BP 110/78 (BP Location: Left Arm, Cuff Size: Normal)   Pulse 61   Temp (!) 97.5 F (36.4 C)   Ht  (1.778 m)  Wt 150 lb 12.8 oz (68.4 kg)   SpO2 96%   BMI 21.64 kg/m  GENERAL: Thin well-developed gentleman, no acute distress.  Well-groomed, fully ambulatory.  Mild conversational dyspnea. HEAD: Normocephalic, atraumatic.  EYES: Pupils equal, round, reactive to light.  No scleral icterus.  MOUTH: Oral mucosa moist.  No thrush. NECK: Supple. No thyromegaly. Trachea midline. No JVD.  No adenopathy. PULMONARY: Distant breath sounds bilaterally.  No adventitious sounds. CARDIOVASCULAR: S1 and S2.  Bradycardic, regular rhythm.  No rubs, murmurs or gallops heard. ABDOMEN: Benign. MUSCULOSKELETAL: No  joint deformity, no clubbing, no edema.  NEUROLOGIC: No overt focal deficit, no gait disturbance, speech is fluent. SKIN: Intact,warm,dry. PSYCH: Mood and behavior normal.  Ambulatory oximetry performed today: Patient was able to ambulate 750 feet.  Moderate dyspnea during ambulation.  Resting O2 sat 95%, O2 sat nadir 92%.  Resting heart rate 64 bpm, maximum heart rate 89 bpm.  Nitric oxide: 25 ppb     Assessment & Plan:     ICD-10-CM   1. Severe persistent asthma without complication  J45.50 POCT EXHALED NITRIC OXIDE    AMB referral to pulmonary rehabilitation   Continue Dupixent Start Trelegy to 200, 1 inhalation only Discontinue Breztri    2. Stage 3 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 (HCC)  J44.9 POCT EXHALED NITRIC OXIDE    AMB referral to pulmonary rehabilitation   Trelegy 200 Continue as needed albuterol    3. Nocturnal hypoxemia due to emphysema (HCC)  J43.9    G47.36    Continue oxygen at 2 L/min Patient notes benefit of therapy Patient compliant    4. DOE (dyspnea on exertion)  R06.09    Main driver is COPD/asthma Referral to pulmonary rehab     Orders Placed This Encounter  Procedures   AMB referral to pulmonary rehabilitation    Referral Priority:   Routine    Referral Type:   Consultation    Number of Visits Requested:   1   POCT EXHALED NITRIC OXIDE   Meds ordered this encounter  Medications   Fluticasone-Umeclidin-Vilant (TRELEGY ELLIPTA) 200-62.5-25 MCG/ACT AEPB    Sig: Inhale 1 puff into the lungs daily.    Dispense:  28 each    Refill:  0    Order Specific Question:   Lot Number?    Answer:   [id number]    Order Specific Question:   Expiration Date?    Answer:   04/11/2023    Order Specific Question:   Quantity    Answer:   2   Will see the patient in follow-up in 6 to 8 weeks time call sooner should any new problems arise.  Gailen Shelter, MD Advanced Bronchoscopy PCCM [facility]    *This note was dictated using voice  recognition software/Dragon.  Despite best efforts to proofread, errors can occur which can change the meaning. Any transcriptional errors that result from this process are unintentional and may not be fully corrected at the time of dictation.

## 2021-11-25 ENCOUNTER — Telehealth: Payer: Self-pay | Admitting: Pulmonary Disease

## 2021-11-25 DIAGNOSIS — J4550 Severe persistent asthma, uncomplicated: Secondary | ICD-10-CM

## 2021-11-27 MED ORDER — DUPIXENT 300 MG/2ML ~~LOC~~ SOSY: 300.0000 mg | PREFILLED_SYRINGE | SUBCUTANEOUS | 1 refills | Status: DC

## 2021-11-27 NOTE — Telephone Encounter (Signed)
Refill sent for [place] to [facility]: [phone]  Dose: 300 mg SQ PFS every 2 weeks  Last OV: 11/18/21 Provider: Dr. Patsey Berthold  Next OV: 12/30/21  Called patient to advise.  Knox Saliva, PharmD, MPH, BCPS Clinical Pharmacist (Rheumatology and Pulmonology)

## 2021-12-06 ENCOUNTER — Ambulatory Visit
Admission: EM | Admit: 2021-12-06 | Discharge: 2021-12-06 | Disposition: A | Discharge status: Home / Self Care | Payer: PPO | Attending: Physician Assistant | Admitting: Physician Assistant

## 2021-12-06 DIAGNOSIS — Z79899 Other long term (current) drug therapy: Secondary | ICD-10-CM | POA: Insufficient documentation

## 2021-12-06 DIAGNOSIS — J432 Centrilobular emphysema: Secondary | ICD-10-CM | POA: Diagnosis not present

## 2021-12-06 DIAGNOSIS — R5383 Other fatigue: Secondary | ICD-10-CM | POA: Diagnosis not present

## 2021-12-06 DIAGNOSIS — J441 Chronic obstructive pulmonary disease with (acute) exacerbation: Secondary | ICD-10-CM

## 2021-12-06 DIAGNOSIS — Z8616 Personal history of COVID-19: Secondary | ICD-10-CM | POA: Diagnosis not present

## 2021-12-06 DIAGNOSIS — R051 Acute cough: Secondary | ICD-10-CM

## 2021-12-06 DIAGNOSIS — Z7901 Long term (current) use of anticoagulants: Secondary | ICD-10-CM | POA: Diagnosis not present

## 2021-12-06 DIAGNOSIS — Z1152 Encounter for screening for COVID-19: Secondary | ICD-10-CM | POA: Diagnosis not present

## 2021-12-06 DIAGNOSIS — I4719 Other supraventricular tachycardia: Secondary | ICD-10-CM | POA: Diagnosis not present

## 2021-12-06 DIAGNOSIS — Z7951 Long term (current) use of inhaled steroids: Secondary | ICD-10-CM | POA: Insufficient documentation

## 2021-12-06 DIAGNOSIS — J4550 Severe persistent asthma, uncomplicated: Secondary | ICD-10-CM | POA: Diagnosis not present

## 2021-12-06 LAB — SARS CORONAVIRUS 2 BY RT PCR: SARS Coronavirus 2 by RT PCR: NEGATIVE

## 2021-12-06 MED ORDER — DOXYCYCLINE HYCLATE 100 MG PO CAPS: 100.0000 mg | ORAL_CAPSULE | Freq: Two times a day (BID) | ORAL | 0 refills | Status: AC

## 2021-12-06 MED ORDER — GUAIFENESIN-CODEINE 100-10 MG/5ML PO SYRP: 5.0000 mL | ORAL_SOLUTION | Freq: Three times a day (TID) | ORAL | 0 refills | Status: DC | PRN

## 2021-12-06 MED ORDER — PREDNISONE 10 MG PO TABS: ORAL_TABLET | ORAL | 0 refills | Status: DC

## 2021-12-06 NOTE — ED Provider Notes (Signed)
[facility]    CSN: [id number] Arrival date & time: 12/06/21  0802      History   Chief Complaint Chief Complaint  Patient presents with   Cough   Nasal Congestion    HPI Antonio [name] is a 76 y.o. male with significant medical history for severe persistent asthma, stage 3 COPD, paroxysmal SVT and atrial fibrillation. Patient uses Dupixent, albuterol, Breztri, Trelegy and is on Xarelto.   Today, patient is presenting for 3 day history of fatigue, postnasal drainage, nasal congestion, dry and productive cough.  Denies any increase shortness of breath from baseline and has not had any fevers, body aches, sinus pain, chest pain or weakness.  He states that he is still feeling poorly and having difficulty sleeping due to the cough.  He recently finished a leftover prescription of Cheratussin from last year.  Believes this medication to be helpful.  Has not tested for COVID at home.  Has been fully vaccinated COVID-19 history of COVID-19 in 2021.  He is denying any sick contacts. Patient is denying any pain in his chest, dizziness, presyncope or syncope.  No other complaints today.  HPI  Past Medical History:  Diagnosis Date   Allergy    Calf pain    COPD (chronic obstructive pulmonary disease) (HCC)    no Oxygen per pt   COVID-19    Dyspnea    Hypokalemia    Internal hemorrhoids    Internal hemorrhoids    Muscle cramp    Muscle pain    PAD (peripheral artery disease) (HCC)    Paroxysmal atrial fibrillation ([place]) 08/2019   Pneumonia    PONV (postoperative nausea and vomiting)    Tubular adenoma of colon    Ulcerative colitis [facility])     Patient Active Problem List   Diagnosis Date Noted   DOE (dyspnea on exertion) 01/24/2021   Secondary hypercoagulable state ([place]) 12/19/2020   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   PAD (peripheral artery disease) ([place]) 11/08/2019   Paroxysmal atrial fibrillation ([place]) 09/02/2019   Stiffness of finger joint of left hand  04/06/2019   History of COVID-19 01/20/2019   Severe persistent asthma without complication 58/83/2549   Medication management 09/20/2018   Chronic obstructive pulmonary disease ([place]) 09/20/2018   Increased eosinophils in the blood 06/21/2018   Diffusion capacity of lung (dl), decreased 03/11/2018   COPD with acute exacerbation ([place]) 06/22/2017   Atrial flutter, paroxysmal ([place]) 10/17/2016   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/2016   PSVT (paroxysmal supraventricular tachycardia) 10/17/2016   Ulcerative colitis with complication ([place]) 82/64/1583   Cervical stenosis of spinal canal 05/05/2016   Carotid stenosis 05/02/2016   Atherosclerosis of native arteries of extremity with intermittent claudication ([place]) 05/02/2016   Cramping of hands 01/10/2016   Bilateral carpal tunnel syndrome 12/06/2015   Diarrhea 06/09/2014   Centrilobular Emphysema 04/25/2014   Former smoker 04/25/2014   Tobacco abuse 04/25/2014    Past Surgical History:  Procedure Laterality Date   ANTERIOR CERVICAL DECOMP/DISCECTOMY FUSION N/A 05/05/2016   Procedure: ANTERIOR CERVICAL DECOMPRESSION FUSION CERVICAL THREE-FOUR.;  Surgeon: Earnie Larsson, MD;  Location: [facility];  Service: Neurosurgery;  Laterality: N/A;  right side approach   CATARACT EXTRACTION Bilateral    CERVICAL DISCECTOMY  04/2016   C3-C4   COLONOSCOPY     COLONOSCOPY  08/09/2015   HEMORRHOID SURGERY  1988   leg stent Left approx 5-6 yrs ago   2 to left leg   MENISCUS REPAIR Left 12/2013   MOHS SURGERY  POLYPECTOMY     ROTATOR CUFF REPAIR Left 2007   TONSILLECTOMY AND ADENOIDECTOMY  1957       Home Medications    Prior to Admission medications   Medication Sig Start Date End Date Taking? Authorizing Provider  albuterol (PROVENTIL) (2.5 MG/3ML) 0.083% nebulizer solution Take 3 mLs (2.5 mg total) by nebulization every 6 (six) hours as needed for wheezing or shortness of breath. 04/22/21  Yes Tyler Pita, MD  albuterol (VENTOLIN HFA) 108 (90 Base)  MCG/ACT inhaler Inhale 2 puffs into the lungs every 6 (six) hours as needed for wheezing or shortness of breath.   Yes [provider]  azelastine (ASTELIN) 0.1 % nasal spray Place 1 spray into both nostrils 2 (two) times daily. Use in each nostril as directed 04/18/21  Yes Tyler Pita, MD  Budeson-Glycopyrrol-Formoterol (BREZTRI AEROSPHERE) 160-9-4.8 MCG/ACT AERO Inhale 2 puffs into the lungs in the morning and at bedtime. 06/05/20  Yes Parrett, Tammy S, NP  doxycycline (VIBRAMYCIN) 100 MG capsule Take 1 capsule (100 mg total) by mouth 2 (two) times daily for 7 days. 12/06/21 12/13/21 Yes Laurene Footman B, PA-C  dupilumab (DUPIXENT) 300 MG/2ML prefilled syringe Inject 300 mg into the skin every 14 (fourteen) days. 11/27/21  Yes Tyler Pita, MD  EPINEPHrine 0.3 mg/0.3 mL IJ SOAJ injection Inject 0.3 mg into the muscle as needed for anaphylaxis. 06/05/20  Yes Parrett, Tammy S, NP  flecainide (TAMBOCOR) 50 MG tablet Take 1.5 tablets (75 mg total) by mouth 2 (two) times daily. 09/04/21  Yes Vickie Epley, MD  Fluticasone-Umeclidin-Vilant (TRELEGY ELLIPTA) 200-62.5-25 MCG/ACT AEPB Inhale 1 puff into the lungs daily. 11/18/21  Yes Tyler Pita, MD  guaiFENesin-codeine [facility]) 100-10 MG/5ML syrup Take 5 mLs by mouth 3 (three) times daily as needed for cough. 12/06/21  Yes Laurene Footman B, PA-C  hydrocortisone 2.5 % cream Apply 1 application. topically as needed. 07/05/19  Yes [provider]  ipratropium (ATROVENT) 0.02 % nebulizer solution Take 2.5 mLs (0.5 mg total) by nebulization 4 (four) times daily. 06/05/20  Yes Parrett, Tammy S, NP  mesalamine (LIALDA) 1.2 g EC tablet TAKE 4 TABLETS BY MOUTH DAILY WITH BREAKFAST 10/03/20  Yes Pyrtle, Lajuan Lines, MD  metoprolol succinate (TOPROL-XL) 25 MG 24 hr tablet TAKE 1 TABLET (25 MG TOTAL) BY MOUTH DAILY. 09/30/21 12/29/21 Yes End, Harrell Gave, MD  predniSONE (DELTASONE) 10 MG tablet Take 6 tabs p.o. on day 1 and decrease by 1  tablet daily until complete 12/06/21  Yes Danton Clap, PA-C  rivaroxaban (XARELTO) 20 MG TABS tablet Take 1 tablet (20 mg total) by mouth daily with supper. 09/13/21  Yes Vickie Epley, MD  rosuvastatin (CRESTOR) 10 MG tablet Take 1 tablet (10 mg total) by mouth daily. 09/04/21  Yes End, Harrell Gave, MD    Family History Family History  Problem Relation Age of Onset   Cancer Mother        leukemia   Colon polyps Mother    Heart disease Father    Heart attack Father 14   Healthy Daughter    Healthy Son    Colon cancer Neg Hx    Esophageal cancer Neg Hx    Rectal cancer Neg Hx    Stomach cancer Neg Hx     Social History Social History   Tobacco Use   Smoking status: Former    Packs/day: 0.50    Years: 35.00    Total pack years: 17.50    Types:  Cigarettes, Cigars    Quit date: 06/11/2011    Years since quitting: 10.4   Smokeless tobacco: Never   Tobacco comments:    Former smoker 12/19/2020  Vaping Use   Vaping Use: Former  Substance Use Topics   Alcohol use: Yes    Alcohol/week: 6.0 standard drinks of alcohol    Types: 6 Shots of liquor per week   Drug use: No     Allergies   Penicillins   Review of Systems Review of Systems  Constitutional:  Positive for fatigue. Negative for fever.  HENT:  Positive for congestion, postnasal drip and rhinorrhea. Negative for sinus pressure and sore throat.   Respiratory:  Positive for cough. Negative for shortness of breath.   Cardiovascular:  Negative for chest pain.  Gastrointestinal:  Negative for abdominal pain, diarrhea, nausea and vomiting.  Musculoskeletal:  Negative for myalgias.  Neurological:  Negative for weakness, light-headedness and headaches.  Hematological:  Negative for adenopathy.  Psychiatric/Behavioral:  Positive for sleep disturbance.      Physical Exam Triage Vital Signs ED Triage Vitals [11/23/20 1118]  Enc Vitals Group     BP (!) 123/58     Pulse Rate 62     Resp 18     Temp 98.8 F  (37.1 C)     Temp Source Oral     SpO2 96 %     Weight 149 lb (67.6 kg)     Height 5' 11"  (1.803 m)     Head Circumference      Peak Flow      Pain Score 0     Pain Loc      Pain Edu?      Excl. in [place]?    No data found.  Updated Vital Signs BP 108/64 (BP Location: Left Arm)   Pulse 66   Temp 98.4 F (36.9 C) (Oral)   Resp 18   Ht 5' 11"  (1.803 m)   Wt 151 lb (68.5 kg)   SpO2 91%   BMI 21.06 kg/m     Physical Exam Vitals and nursing note reviewed.  Constitutional:      General: He is not in acute distress.    Appearance: Normal appearance. He is well-developed. He is not ill-appearing.  HENT:     Head: Normocephalic and atraumatic.     Nose: Congestion and rhinorrhea (small amount of yellowish/milky drainage) present.     Mouth/Throat:     Mouth: Mucous membranes are moist.     Pharynx: Oropharynx is clear.  Eyes:     General: No scleral icterus.    Conjunctiva/sclera: Conjunctivae normal.  Cardiovascular:     Rate and Rhythm: Normal rate and regular rhythm.     Heart sounds: Normal heart sounds.  Pulmonary:     Effort: Pulmonary effort is normal. No respiratory distress.     Breath sounds: Rhonchi (few scattered rhonchi) present.  Musculoskeletal:     Cervical back: Neck supple.  Skin:    General: Skin is warm and dry.  Neurological:     General: No focal deficit present.     Mental Status: He is alert. Mental status is at baseline.     Motor: No weakness.     Coordination: Coordination normal.     Gait: Gait normal.  Psychiatric:        Mood and Affect: Mood normal.        Behavior: Behavior normal.        Thought Content: Thought content  normal.      UC Treatments / Results  Labs (all labs ordered are listed, but only abnormal results are displayed) Labs Reviewed  SARS CORONAVIRUS 2 BY RT PCR    EKG   Radiology No results found.  Procedures Procedures (including critical care time)  Medications Ordered in UC Medications - No data to  display  Initial Impression / Assessment and Plan / UC Course  I have reviewed the triage vital signs and the nursing notes.  Pertinent labs & imaging results that were available during my care of the patient were reviewed by me and considered in my medical decision making (see chart for details).  76 year old male presenting for 2 to 3 day history of nasal congestion, fatigue, postnasal drainage, and productive cough with sleep disturbance.   Denies fever or breathing difficulty.  No sick contacts or known exposure to COVID-19.  Vitals all stable today and he is overall well-appearing.  He does have nasal congestion/mucosal edema. Also few scattered rhonchi throughout chest.  No respiratory distress.  Oxygen is 91% initially.  Recheck with a different O2 sensor is 96%.  Patient reports his normal oxygen saturation is about 96%.  PCR COVID testing.  Current CDC guidelines, isolation protocol and ED precautions reviewed with patient.  If positive for COVID-19, will send antiviral medication.  Suspect patient having COPD exacerbation.  He has significant penicillin allergy, so treating with doxycycline.  Also prescribed prednisone and Cheratussin.  Patient has had this medicine before and done well.  Advised him to increase rest and fluids and continue with his at home inhalers and breathing treatments.  Thoroughly reviewed return and ED precautions advised to keep follow-ups.  Negative COVID test.  Final Clinical Impressions(s) / UC Diagnoses   Final diagnoses:  COPD exacerbation ([place])  Acute cough  Other fatigue     Discharge Instructions      -Testing for COVID.  We will call you if your test is positive and send antiviral medication.  If you are positive need to isolate 5 days and wear mask x5 days.  If you do not hear from [place] your COVID test is negative and you may have another virus. -You appear to be having continued COPD exacerbation.  I have sent in a different antibiotics to  pharmacy and something for cough. -Make sure to increase rest and fluids and use your breathing treatments/inhalers as needed and as directed. -Contact pulmonologist if you are still not feeling better after the next 5 to 7 days. -Return or go to emergency department if you develop fever, worsening cough, or breathing problem.            ED Prescriptions     Medication Sig Dispense Auth. Provider   doxycycline (VIBRAMYCIN) 100 MG capsule Take 1 capsule (100 mg total) by mouth 2 (two) times daily for 7 days. 14 capsule Laurene Footman B, PA-C   predniSONE (DELTASONE) 10 MG tablet Take 6 tabs p.o. on day 1 and decrease by 1 tablet daily until complete 21 tablet Laurene Footman B, PA-C   guaiFENesin-codeine (ROBITUSSIN AC) 100-10 MG/5ML syrup Take 5 mLs by mouth 3 (three) times daily as needed for cough. 120 mL Danton Clap, PA-C      I have reviewed the PDMP during this encounter.     Danton Clap, PA-C 12/06/21 [phone]

## 2021-12-06 NOTE — Discharge Instructions (Addendum)
-  Testing for COVID.  We will call you if your test is positive and send antiviral medication.  If you are positive need to isolate 5 days and wear mask x5 days.  If you do not hear from [place] your COVID test is negative and you may have another virus. -You appear to be having continued COPD exacerbation.  I have sent in a different antibiotics to pharmacy and something for cough. -Make sure to increase rest and fluids and use your breathing treatments/inhalers as needed and as directed. -Contact pulmonologist if you are still not feeling better after the next 5 to 7 days. -Return or go to emergency department if you develop fever, worsening cough, or breathing problem.

## 2021-12-06 NOTE — ED Triage Notes (Signed)
Pt c/o cough, nasal drainage, chest congestion x3days  Pt denies a covid and flu test.  Pt is with his wife and she states she hears "rumbling and gurgling" in his chest.   Pt has COPD and was told to go to a urgent care if he develops a cold.

## 2021-12-09 ENCOUNTER — Encounter (INDEPENDENT_AMBULATORY_CARE_PROVIDER_SITE_OTHER): Payer: Self-pay

## 2021-12-13 ENCOUNTER — Encounter: Payer: Self-pay | Admitting: Emergency Medicine

## 2021-12-13 ENCOUNTER — Other Ambulatory Visit: Payer: Self-pay

## 2021-12-13 ENCOUNTER — Emergency Department
Admission: EM | Admit: 2021-12-13 | Discharge: 2021-12-13 | Disposition: A | Discharge status: Home / Self Care | Payer: PPO | Attending: Emergency Medicine | Admitting: Emergency Medicine

## 2021-12-13 ENCOUNTER — Emergency Department: Payer: PPO

## 2021-12-13 DIAGNOSIS — K219 Gastro-esophageal reflux disease without esophagitis: Secondary | ICD-10-CM | POA: Diagnosis not present

## 2021-12-13 DIAGNOSIS — J449 Chronic obstructive pulmonary disease, unspecified: Secondary | ICD-10-CM | POA: Diagnosis not present

## 2021-12-13 DIAGNOSIS — Z20822 Contact with and (suspected) exposure to covid-19: Secondary | ICD-10-CM | POA: Insufficient documentation

## 2021-12-13 DIAGNOSIS — J45909 Unspecified asthma, uncomplicated: Secondary | ICD-10-CM | POA: Insufficient documentation

## 2021-12-13 DIAGNOSIS — R079 Chest pain, unspecified: Secondary | ICD-10-CM | POA: Diagnosis not present

## 2021-12-13 DIAGNOSIS — K2100 Gastro-esophageal reflux disease with esophagitis, without bleeding: Secondary | ICD-10-CM

## 2021-12-13 DIAGNOSIS — Z7901 Long term (current) use of anticoagulants: Secondary | ICD-10-CM | POA: Diagnosis not present

## 2021-12-13 DIAGNOSIS — R0789 Other chest pain: Secondary | ICD-10-CM | POA: Diagnosis not present

## 2021-12-13 LAB — BASIC METABOLIC PANEL
Anion gap: 7 (ref 5–15)
BUN: 17 mg/dL (ref 8–23)
CO2: 27 mmol/L (ref 22–32)
Calcium: 9.8 mg/dL (ref 8.9–10.3)
Chloride: 107 mmol/L (ref 98–111)
Creatinine, Ser: 1.09 mg/dL (ref 0.61–1.24)
GFR, Estimated: >60 mL/min (ref >60)
Glucose, Bld: 109 mg/dL — ABNORMAL HIGH (ref 70–99)
Potassium: 4.3 mmol/L (ref 3.5–5.1)
Sodium: 141 mmol/L (ref 135–145)

## 2021-12-13 LAB — CBC
HCT: 46.1 % (ref 39.0–52.0)
Hemoglobin: 14.9 g/dL (ref 13.0–17.0)
MCH: 29.9 pg (ref 26.0–34.0)
MCHC: 32.3 g/dL (ref 30.0–36.0)
MCV: 92.4 fL (ref 80.0–100.0)
Platelets: 236 10*3/uL (ref 150–400)
RBC: 4.99 MIL/uL (ref 4.22–5.81)
RDW: 11.7 % (ref 11.5–15.5)
WBC: 16.3 10*3/uL — ABNORMAL HIGH (ref 4.0–10.5)
nRBC: 0 % (ref 0.0–0.2)

## 2021-12-13 LAB — RESP PANEL BY RT-PCR (FLU A&B, COVID) ARPGX2
Influenza A by PCR: NEGATIVE
Influenza B by PCR: NEGATIVE
SARS Coronavirus 2 by RT PCR: NEGATIVE

## 2021-12-13 LAB — HEPATIC FUNCTION PANEL
ALT: 17 U/L (ref 0–44)
AST: 22 U/L (ref 15–41)
Albumin: 3.6 g/dL (ref 3.5–5.0)
Alkaline Phosphatase: 69 U/L (ref 38–126)
Bilirubin, Direct: 0.2 mg/dL (ref 0.0–0.2)
Indirect Bilirubin: 0.5 mg/dL (ref 0.3–0.9)
Total Bilirubin: 0.7 mg/dL (ref 0.3–1.2)
Total Protein: 7.5 g/dL (ref 6.5–8.1)

## 2021-12-13 LAB — PROTIME-INR
INR: 1.3 — ABNORMAL HIGH (ref 0.8–1.2)
Prothrombin Time: 16.5 seconds — ABNORMAL HIGH (ref 11.4–15.2)

## 2021-12-13 LAB — TROPONIN I (HIGH SENSITIVITY)
Troponin I (High Sensitivity): 5 ng/L (ref <18)
Troponin I (High Sensitivity): 6 ng/L (ref <18)

## 2021-12-13 LAB — LIPASE, BLOOD: Lipase: 26 U/L (ref 11–51)

## 2021-12-13 MED ORDER — ONDANSETRON 4 MG PO TBDP: 4.0000 mg | ORAL_TABLET | Freq: Three times a day (TID) | ORAL | 0 refills | Status: DC | PRN

## 2021-12-13 MED ORDER — LIDOCAINE VISCOUS HCL 2 % MT SOLN
15.0000 mL | Freq: Once | OROMUCOSAL | Status: AC
  Administered 2021-12-13: 15 mL via ORAL
  Filled 2021-12-13: qty 15

## 2021-12-13 MED ORDER — ALUM & MAG HYDROXIDE-SIMETH 200-200-20 MG/5ML PO SUSP
30.0000 mL | Freq: Once | ORAL | Status: AC
  Administered 2021-12-13: 30 mL via ORAL
  Filled 2021-12-13: qty 30

## 2021-12-13 NOTE — ED Provider Notes (Signed)
[facility] Provider Note    Event Date/Time   First MD Initiated Contact with Patient 12/13/21 0701     (approximate)   History   Chief Complaint Chest Pain  HPI  Antonio [name] is a 76 y.o. male with past medical history of atrial fibrillation on Xarelto, asthma, COPD, SVT, PAD, and ulcerative colitis who presents to the ED complaining of chest pain.  Patient reports that he has had about 1 week of productive cough with yellowish sputum, was seen at urgent care for this 1 week ago and prescribed prednisone as well as antibiotics.  He states the cough has been about the same and denies any associated shortness of breath, has been using his nebulizer at home with relief.  He does report that he has developed increasing burning pain in his chest over the past 24 hours, particularly severe last night.  He states it feels like something is tightening up in his throat and describes it as similar to heartburn but more severe.  He reports being concerned that he could be having a heart attack, denies any cardiac history beyond atrial fibrillation.  He does report ongoing burning discomfort in his chest, has not noticed any pain or swelling in his legs.     Physical Exam   Triage Vital Signs: ED Triage Vitals  Enc Vitals Group     BP 12/13/21 0431 (!) 128/55     Pulse Rate 12/13/21 0431 65     Resp 12/13/21 0431 16     Temp 12/13/21 0431 98 F (36.7 C)     Temp Source 12/13/21 0431 Oral     SpO2 12/13/21 0431 99 %     Weight 12/13/21 0428 150 lb (68 kg)     Height 12/13/21 0428 5' 11"  (1.803 m)     Head Circumference --      Peak Flow --      Pain Score 12/13/21 0428 0     Pain Loc --      Pain Edu? --      Excl. in [place]? --     Most recent vital signs: Vitals:   12/13/21 0815 12/13/21 0830  BP:  (!) 143/62  Pulse: (!) 51 (!) 53  Resp: 13 16  Temp:    SpO2: 96% 97%    Constitutional: Alert and oriented. Eyes: Conjunctivae are normal. Head:  Atraumatic. Nose: No congestion/rhinnorhea. Mouth/Throat: Mucous membranes are moist.  Cardiovascular: Normal rate, regular rhythm. Grossly normal heart sounds.  2+ radial pulses bilaterally. Respiratory: Normal respiratory effort.  No retractions. Lungs CTAB.  No chest wall tenderness to palpation. Gastrointestinal: Soft and nontender. No distention. Musculoskeletal: No lower extremity tenderness nor edema.  Neurologic:  Normal speech and language. No gross focal neurologic deficits are appreciated.    ED Results / Procedures / Treatments   Labs (all labs ordered are listed, but only abnormal results are displayed) Labs Reviewed  BASIC METABOLIC PANEL - Abnormal; Notable for the following components:      Result Value   Glucose, Bld 109 (*)    All other components within normal limits  CBC - Abnormal; Notable for the following components:   WBC 16.3 (*)    All other components within normal limits  PROTIME-INR - Abnormal; Notable for the following components:   Prothrombin Time 16.5 (*)    INR 1.3 (*)    All other components within normal limits  RESP PANEL BY RT-PCR (FLU A&B, COVID) [id number]  HEPATIC FUNCTION PANEL  LIPASE, BLOOD  TROPONIN I (HIGH SENSITIVITY)  TROPONIN I (HIGH SENSITIVITY)     EKG  ED ECG REPORT I, Blake Divine, the attending physician, personally viewed and interpreted this ECG.   Date: 12/13/2021  EKG Time: 4:28  Rate: 57  Rhythm: sinus bradycardia  Axis: Normal  Intervals:none  ST&T Change: None  RADIOLOGY Chest x-ray reviewed and interpreted by me with no infiltrate, edema, or effusion.  PROCEDURES:  Critical Care performed: No  Procedures   MEDICATIONS ORDERED IN ED: Medications  alum & mag hydroxide-simeth (MAALOX/MYLANTA) 200-200-20 MG/5ML suspension 30 mL (30 mLs Oral Given 12/13/21 0740)    And  lidocaine (XYLOCAINE) 2 % viscous mouth solution 15 mL (15 mLs Oral Given 12/13/21 0740)     IMPRESSION / MDM / ASSESSMENT AND PLAN  / ED COURSE  I reviewed the triage vital signs and the nursing notes.                              76 y.o. male with past medical history of atrial fibrillation on Xarelto, SVT, asthma, COPD, PAD, and ulcerative colitis who presents to the ED complaining of burning discomfort in his chest and tightness in his throat after dealing with 1 week of productive cough.  Patient's presentation is most consistent with acute presentation with potential threat to life or bodily function.  Differential diagnosis includes, but is not limited to, ACS, PE, dissection, pneumonia, pneumothorax, COPD exacerbation, COVID-19, GERD, musculoskeletal pain.  Patient nontoxic-appearing and in no acute distress, vital signs remarkable for mild bradycardia but otherwise reassuring.  He is not in any respiratory distress and maintaining oxygen saturations on room air, lungs clear to auscultation bilaterally.  EKG shows sinus bradycardia with no ischemic changes and symptoms seem atypical for ACS, more consistent with GERD.  Initial troponin is negative, will check second set and treat with GI cocktail.  I doubt PE or dissection at this time given atypical pain with reassuring vital signs.  Remainder of labs are reassuring, remarkable only for leukocytosis which is likely secondary to steroid use.  No significant anemia, electrolyte abnormality, or AKI noted.  LFTs and lipase are unremarkable.  Patient reports significant improvement in chest discomfort following GI cocktail, repeat troponin within normal limits and suspect patient with gastritis/esophagitis related to recent steroid use.  He has almost completed the course of steroids, was counseled to take Pepcid or Tums as needed for now and to follow-up with his PCP.  He was counseled to return to the ED for new or worsening symptoms, patient agrees with plan.      FINAL CLINICAL IMPRESSION(S) / ED DIAGNOSES   Final diagnoses:  Chest pain, unspecified type   Gastroesophageal reflux disease with esophagitis without hemorrhage  Chronic obstructive pulmonary disease, unspecified COPD type ([place])     Rx / DC Orders   ED Discharge Orders          Ordered    ondansetron (ZOFRAN-ODT) 4 MG disintegrating tablet  Every 8 hours PRN        12/13/21 0844             Note:  This document was prepared using Dragon voice recognition software and may include unintentional dictation errors.   Blake Divine, MD 12/13/21 [phone]

## 2021-12-13 NOTE — ED Triage Notes (Signed)
Pt to ED from home c/o heartburn tonight and up into throat, productive yellow cough, nausea without vomiting. Denies SOB or fevers.  Hx afib, Xeralto use, seen by Dr. Saunders Revel.  EMS came to house and gave no concerns but to come to ED to be safe, given 324 ASA with EMS PTA.  PT A&Ox4, chest rise even and unlabored, skin WNL, and in NAD at this time.

## 2021-12-13 NOTE — ED Notes (Signed)
Pt. Verbalizes that he feels much better after meds, Dr. Charna Archer notified.

## 2021-12-16 ENCOUNTER — Ambulatory Visit (INDEPENDENT_AMBULATORY_CARE_PROVIDER_SITE_OTHER): Payer: PPO | Admitting: Family Medicine

## 2021-12-16 ENCOUNTER — Telehealth: Payer: Self-pay | Admitting: Pulmonary Disease

## 2021-12-16 ENCOUNTER — Ambulatory Visit
Admission: RE | Admit: 2021-12-16 | Discharge: 2021-12-16 | Disposition: A | Discharge status: Home / Self Care | Payer: PPO | Attending: Family Medicine | Admitting: Family Medicine

## 2021-12-16 ENCOUNTER — Telehealth: Payer: Self-pay

## 2021-12-16 ENCOUNTER — Encounter: Payer: Self-pay | Admitting: Family Medicine

## 2021-12-16 ENCOUNTER — Ambulatory Visit: Payer: PPO

## 2021-12-16 ENCOUNTER — Ambulatory Visit
Admission: RE | Admit: 2021-12-16 | Discharge: 2021-12-16 | Disposition: A | Discharge status: Home / Self Care | Payer: PPO | Source: Ambulatory Visit | Attending: Family Medicine | Admitting: Family Medicine

## 2021-12-16 VITALS — BP 128/78 | HR 88 | Ht 71.0 in | Wt 145.0 lb

## 2021-12-16 DIAGNOSIS — I480 Paroxysmal atrial fibrillation: Secondary | ICD-10-CM

## 2021-12-16 DIAGNOSIS — J449 Chronic obstructive pulmonary disease, unspecified: Secondary | ICD-10-CM | POA: Diagnosis not present

## 2021-12-16 DIAGNOSIS — R0602 Shortness of breath: Secondary | ICD-10-CM

## 2021-12-16 DIAGNOSIS — R0989 Other specified symptoms and signs involving the circulatory and respiratory systems: Secondary | ICD-10-CM | POA: Diagnosis not present

## 2021-12-16 DIAGNOSIS — J019 Acute sinusitis, unspecified: Secondary | ICD-10-CM | POA: Diagnosis not present

## 2021-12-16 MED ORDER — PREDNISONE 10 MG PO TABS: ORAL_TABLET | ORAL | 0 refills | Status: DC

## 2021-12-16 MED ORDER — BREZTRI AEROSPHERE 160-9-4.8 MCG/ACT IN AERO: 2.0000 | INHALATION_SPRAY | Freq: Two times a day (BID) | RESPIRATORY_TRACT | 2 refills | Status: DC

## 2021-12-16 MED ORDER — DOXYCYCLINE HYCLATE 100 MG PO TABS: 100.0000 mg | ORAL_TABLET | Freq: Two times a day (BID) | ORAL | 0 refills | Status: DC

## 2021-12-16 NOTE — Progress Notes (Unsigned)
Date:  12/16/2021   Name:  Antonio [name]   DOB:  01-Nov-1945   MRN:  [id number]   Chief Complaint: Cough (Yellow phlegm, nasal drainage) and COPD  Cough This is a recurrent problem. The current episode started 1 to 4 weeks ago. The problem has been waxing and waning. The cough is Productive of purulent sputum. Associated symptoms include chest pain, heartburn and shortness of breath. Pertinent negatives include no nasal congestion, sore throat or wheezing. He has tried oral steroids, a beta-agonist inhaler, ipratropium inhaler and steroid inhaler for the symptoms. His past medical history is significant for COPD.  COPD He complains of cough and shortness of breath. There is no wheezing. The cough is productive. Associated symptoms include chest pain and heartburn. Pertinent negatives include no dyspnea on exertion, nasal congestion, orthopnea or sore throat. His past medical history is significant for COPD.    Lab Results  Component Value Date   NA 141 12/13/2021   K 4.3 12/13/2021   CO2 27 12/13/2021   GLUCOSE 109 (H) 12/13/2021   BUN 17 12/13/2021   CREATININE 1.09 12/13/2021   CALCIUM 9.8 12/13/2021   GFRNONAA >60 12/13/2021   Lab Results  Component Value Date   CHOL 175 12/17/2020   HDL 54 12/17/2020   LDLCALC 103 (H) 12/17/2020   TRIG 96 12/17/2020   CHOLHDL 2.6 12/13/2018   Lab Results  Component Value Date   TSH 1.783 07/03/2021   No results found for: "HGBA1C" Lab Results  Component Value Date   WBC 16.3 (H) 12/13/2021   HGB 14.9 12/13/2021   HCT 46.1 12/13/2021   MCV 92.4 12/13/2021   PLT 236 12/13/2021   Lab Results  Component Value Date   ALT 17 12/13/2021   AST 22 12/13/2021   ALKPHOS 69 12/13/2021   BILITOT 0.7 12/13/2021   No results found for: "25OHVITD2", "25OHVITD3", "VD25OH"   Review of Systems  HENT:  Negative for sore throat.   Respiratory:  Positive for cough and shortness of breath. Negative for chest tightness and wheezing.    Cardiovascular:  Positive for chest pain. Negative for dyspnea on exertion and palpitations.  Gastrointestinal:  Positive for heartburn.    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   PAD (peripheral artery disease) ([place]) 11/08/2019   Paroxysmal atrial fibrillation ([place]) 09/02/2019   Stiffness of finger joint of left hand 04/06/2019   History of COVID-19 01/20/2019   Severe persistent asthma without complication 05/01/2246   Medication management 09/20/2018   Chronic obstructive pulmonary disease ([place]) 09/20/2018   Increased eosinophils in the blood 06/21/2018   Diffusion capacity of lung (dl), decreased 03/11/2018   COPD with acute exacerbation ([place]) 06/22/2017   Atrial flutter, paroxysmal ([place]) 10/17/2016   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/2016   PSVT (paroxysmal supraventricular tachycardia) 10/17/2016   Ulcerative colitis with complication ([place]) 25/00/3704   Cervical stenosis of spinal canal 05/05/2016   Carotid stenosis 05/02/2016   Atherosclerosis of native arteries of extremity with intermittent claudication ([place]) 05/02/2016   Cramping of hands 01/10/2016   Bilateral carpal tunnel syndrome 12/06/2015   Diarrhea 06/09/2014   Centrilobular Emphysema 04/25/2014   Former smoker 04/25/2014   Tobacco abuse 04/25/2014    Allergies  Allergen Reactions   Penicillins Rash    Has patient had a PCN reaction causing immediate rash, facial/tongue/throat swelling, SOB or lightheadedness with hypotension:Yes  Has patient had a PCN reaction causing severe rash involving mucus membranes or skin necrosis:Yes Has patient had a PCN reaction that required hospitalization:No Has patient had a PCN reaction occurring within the last 10 years:No If all of the above answers are "NO", then may proceed with Cephalosporin use.     Past Surgical History:  Procedure Laterality  Date   ANTERIOR CERVICAL DECOMP/DISCECTOMY FUSION N/A 05/05/2016   Procedure: ANTERIOR CERVICAL DECOMPRESSION FUSION CERVICAL THREE-FOUR.;  Surgeon: Earnie Larsson, MD;  Location: [facility];  Service: Neurosurgery;  Laterality: N/A;  right side approach   CATARACT EXTRACTION Bilateral    CERVICAL DISCECTOMY  04/2016   C3-C4   COLONOSCOPY     COLONOSCOPY  08/09/2015   HEMORRHOID SURGERY  1988   leg stent Left approx 5-6 yrs ago   2 to left leg   MENISCUS REPAIR Left 12/2013   MOHS SURGERY     POLYPECTOMY     ROTATOR CUFF REPAIR Left 2007   TONSILLECTOMY AND ADENOIDECTOMY  1957    Social History   Tobacco Use   Smoking status: Former    Packs/day: 0.50    Years: 35.00    Total pack years: 17.50    Types: Cigarettes, Cigars    Quit date: 06/11/2011    Years since quitting: 10.5   Smokeless tobacco: Never   Tobacco comments:    Former smoker 12/19/2020  Vaping Use   Vaping Use: Former  Substance Use Topics   Alcohol use: Yes    Alcohol/week: 6.0 standard drinks of alcohol    Types: 6 Shots of liquor per week   Drug use: No     Medication list has been reviewed and updated.  Current Meds  Medication Sig   albuterol (PROVENTIL) (2.5 MG/3ML) 0.083% nebulizer solution Take 3 mLs (2.5 mg total) by nebulization every 6 (six) hours as needed for wheezing or shortness of breath.   albuterol (VENTOLIN HFA) 108 (90 Base) MCG/ACT inhaler Inhale 2 puffs into the lungs every 6 (six) hours as needed for wheezing or shortness of breath.   azelastine (ASTELIN) 0.1 % nasal spray Place 1 spray into both nostrils 2 (two) times daily. Use in each nostril as directed   dupilumab (DUPIXENT) 300 MG/2ML prefilled syringe Inject 300 mg into the skin every 14 (fourteen) days.   flecainide (TAMBOCOR) 50 MG tablet Take 1.5 tablets (75 mg total) by mouth 2 (two) times daily.   Fluticasone-Umeclidin-Vilant (TRELEGY ELLIPTA) 200-62.5-25 MCG/ACT AEPB Inhale 1 puff into the lungs daily.   hydrocortisone 2.5 %  cream Apply 1 application. topically as needed.   ipratropium (ATROVENT) 0.02 % nebulizer solution Take 2.5 mLs (0.5 mg total) by nebulization 4 (four) times daily.   mesalamine (LIALDA) 1.2 g EC tablet TAKE 4 TABLETS BY MOUTH DAILY WITH BREAKFAST   metoprolol succinate (TOPROL-XL) 25 MG 24 hr tablet TAKE 1 TABLET (25 MG TOTAL) BY MOUTH DAILY.   rivaroxaban (XARELTO) 20 MG TABS tablet Take 1 tablet (20 mg total) by mouth daily with supper.   rosuvastatin (CRESTOR) 10 MG tablet Take 1 tablet (10 mg total) by mouth daily.       12/16/2021   11:39 AM 12/17/2020    8:45 AM 12/14/2019    9:21 AM  GAD 7 : Generalized Anxiety Score  Nervous, Anxious, on Edge 0 0 0  Control/stop worrying 0 0 0  Worry too much - different things 0 0 0  Trouble relaxing 0 0 0  Restless 0 0 0  Easily annoyed or irritable 0 0 0  Afraid - awful might happen 0 0 0  Total GAD 7 Score 0 0 0  Anxiety Difficulty Not difficult at all         12/16/2021   11:39 AM 12/17/2020    8:45 AM 12/14/2019    9:21 AM  Depression screen PHQ 2/9  Decreased Interest 0 0 0  Down, Depressed, Hopeless 0 0 0  PHQ - 2 Score 0 0 0  Altered sleeping 0 0 0  Tired, decreased energy 0 0 0  Change in appetite 0 0 0  Feeling bad or failure about yourself  0 0 0  Trouble concentrating 0 0 0  Moving slowly or fidgety/restless 0 0 0  Suicidal thoughts 0 0 0  PHQ-9 Score 0 0 0  Difficult doing work/chores Not difficult at all      BP Readings from Last 3 Encounters:  12/16/21 128/78  12/13/21 (!) 139/53  12/06/21 108/64    Physical Exam Vitals and nursing note reviewed.  HENT:     Right Ear: Tympanic membrane normal.     Left Ear: Tympanic membrane normal.  Neck:     Vascular: JVD present.  Cardiovascular:     Rate and Rhythm: Normal rate and regular rhythm.     Pulses:          Radial pulses are 1+ on the right side and 1+ on the left side.     Heart sounds: S1 normal and S2 normal. Heart sounds are distant.  Pulmonary:      Breath sounds: Decreased air movement present.  Neurological:     Mental Status: He is alert.  Psychiatric:     Comments: irratated     Wt Readings from Last 3 Encounters:  12/16/21 145 lb (65.8 kg)  12/13/21 150 lb (68 kg)  12/06/21 151 lb (68.5 kg)    BP 128/78   Pulse 88   Ht 5' 11"  (1.803 m)   Wt 145 lb (65.8 kg)   SpO2 99%   BMI 20.22 kg/m   Assessment and Plan:  1. Chest congestion New onset.  Similar to previous episode for which he was seen in urgent care in which he was treated with a tapering dose of prednisone over 6 days and doxycycline.  Patient does not have any cyanosis and in fact looks somewhat pink as if he is a pink puffer.  Given his severe COPD we will repeat a prednisone taper for which he was doing better when he was on it a couple days previous and resume his doxycycline and that the patient feels like he is got a sinus infection.  Patient has an upcoming appointment later this month with Dr. Patsey Berthold and I have encouraged him to continue his Trelegy. - predniSONE (DELTASONE) 10 MG tablet; Take 6 tabs p.o. on day 1 and decrease by 1 tablet daily until complete  Dispense: 21 tablet; Refill: 0 - Brain natriuretic peptide; Future - DG Chest 2 View; Future  2. Acute sinusitis, recurrence not specified, unspecified location Onset.  Persistent.  Although I do not think this is the primary concern this is a concern of the patient and we will treat for acute sinus with a repeat of doxycycline. - doxycycline (VIBRA-TABS) 100 MG tablet; Take 1 tablet (100 mg total) by mouth 2 (two) times daily.  Dispense: 20 tablet; Refill: 0  3. Shortness of breath Patient was noted to have a pulse ox of 73% upon coming  to the office which improved to 99%.  Patient attributes this to cold fingers although his fingers were not noted to be cold at the time the nurse took him.  I did not see where they did a BNP on him in the emergency room and given the fact that he is on Tambocor we  will check a BNP and a chest x-ray to see if there is any concerns. - predniSONE (DELTASONE) 10 MG tablet; Take 6 tabs p.o. on day 1 and decrease by 1 tablet daily until complete  Dispense: 21 tablet; Refill: 0 - Brain natriuretic peptide; Future - DG Chest 2 View; Future  4. Paroxysmal atrial fibrillation (HCC) Chronic.  Persistent.  Patient cannot have any ablation because of severe lung disease.  Patient was started on Tambocor couple months ago and I do have concerns whether or not there may be some effects of this on his heart.  We will attempt to contact cardiology given that he is on flecainide and whether the this may be contributing to his shortness of breath and symptomatology at this time.  Note for chart: From the very start patient was argumentative and refused to take that he had anything wrong with his lungs/breathing, anything wrong with his heart disc comfort/dyspnea on exertion/could be an effect of the flecainide or GI concern reflux/could be a side effect of the flecainide.  Given his history of COPD, severe persistent asthma, centrilobular emphysema, paroxysmal atrial fibrillation, and most recent diagnosis in ER reflux patient was reluctant to return for cardiology/pulmonary evaluation.  At this point in time I think it would be best that he be reevaluated by cardiology because of the recent addition of flecainide to his medical regimen and he may need to be reevaluated for this at this time and pulmonary who recently started him on Trelegy and needed to know how he was doing and he has not reported back to do so nor has he gone to pulmonary rehab. Otilio Miu, MD

## 2021-12-16 NOTE — Telephone Encounter (Signed)
Transition Care Management Follow-up Telephone Call Date of discharge and from where: 12/12/2021/ [facility] How have you been since you were released from the hospital? [facility] Any questions or concerns? No  Items Reviewed: Did the pt receive and understand the discharge instructions provided? Yes  Medications obtained and verified? Yes  Other? No  Any new allergies since your discharge? Yes  Dietary orders reviewed? Yes Do you have support at home? Yes   Home Care and Equipment/Supplies: Were home health services ordered? No Functional Questionnaire: (I = Independent and D = Dependent) ADLs: I  Bathing/Dressing- I  Meal Prep- I  Eating- I  Maintaining continence- I  Transferring/Ambulation- I  Managing Meds- I  Follow up appointments reviewed:  PCP Hospital f/u appt confirmed? Yes  Scheduled to see Dr Otilio Miu on 12/16/2021 @ 11:00. [facility] f/u appt confirmed? N/a Are transportation arrangements needed? No  If their condition worsens, is the pt aware to call PCP or go to the Emergency Dept.? Yes Was the patient provided with contact information for the PCP's office or ED? Yes Was to pt encouraged to call back with questions or concerns? Yes

## 2021-12-16 NOTE — Telephone Encounter (Signed)
I have read his primary care physician's note.  It seems like he is being treated and at this point we would not add anything else.  The only issue is is that we did switch him to Trelegy during his last visit and he was supposed to let us know how he was doing with that.  It may be that he needs to go back to the [place] 2 puffs twice a day.  In addition he can use his nebulizer 2-3 times a day while he is taking the antibiotics and the prednisone this will hopefully take care of of his coughing episodes.  Keep his appointment on the 20th but definitely call back if he is no better within 48 hours.

## 2021-12-16 NOTE — Telephone Encounter (Signed)
Patient is aware of below message/recommendations and voiced his understanding.  He would like to switch back to [place]. He does not need a Rx at this time.  Nothing further needed.

## 2021-12-16 NOTE — Telephone Encounter (Signed)
Called and spoke to patient.  He stated that he was seen at ED 12/13/2021 for COPD and chest pain. He followed up with PCP today and it was recommended that he follow up with our office prior to 12/30/2021. C/o prod cough with yellow sputum and nasal drainage yellow in color x1w. Denied f/c/s, wheezing, chest congestion or additional sx.  SOB is baseline. PCP prescribed abx and prednisone today.  Dr. Patsey Berthold, please advise. Thanks

## 2021-12-17 ENCOUNTER — Telehealth: Payer: Self-pay

## 2021-12-17 LAB — BRAIN NATRIURETIC PEPTIDE: BNP: 61.9 pg/mL (ref 0.0–100.0)

## 2021-12-17 NOTE — Telephone Encounter (Signed)
Called pt with Dr End appt Thursday Nov 9th @ 10:20 in [place]

## 2021-12-19 ENCOUNTER — Encounter: Payer: Self-pay | Admitting: Internal Medicine

## 2021-12-19 ENCOUNTER — Ambulatory Visit: Payer: PPO | Attending: Internal Medicine | Admitting: Internal Medicine

## 2021-12-19 VITALS — BP 110/60 | HR 60 | Ht 70.0 in | Wt 149.0 lb

## 2021-12-19 DIAGNOSIS — J449 Chronic obstructive pulmonary disease, unspecified: Secondary | ICD-10-CM | POA: Diagnosis not present

## 2021-12-19 DIAGNOSIS — I480 Paroxysmal atrial fibrillation: Secondary | ICD-10-CM | POA: Diagnosis not present

## 2021-12-19 DIAGNOSIS — I739 Peripheral vascular disease, unspecified: Secondary | ICD-10-CM | POA: Diagnosis not present

## 2021-12-19 DIAGNOSIS — R0789 Other chest pain: Secondary | ICD-10-CM

## 2021-12-19 MED ORDER — FAMOTIDINE 20 MG PO TABS: 20.0000 mg | ORAL_TABLET | Freq: Two times a day (BID) | ORAL | Status: DC | PRN

## 2021-12-19 NOTE — Patient Instructions (Addendum)
Medication Instructions:  Ok to take Famotidine 20 mg by mouth twice a day as needed for indigestion (Can purchase over the counter)   *If you need a refill on your cardiac medications before your next appointment, please call your pharmacy*   Lab Work: None ordered today   Testing/Procedures: None ordered today   Follow-Up: At [facility], you and your health needs are our priority.  As part of our continuing mission to provide you with exceptional heart care, we have created designated Provider Care Teams.  These Care Teams include your primary Cardiologist (physician) and Advanced Practice Providers (APPs -  Physician Assistants and Nurse Practitioners) who all work together to provide you with the care you need, when you need it.  We recommend signing up for the patient portal called "MyChart".  Sign up information is provided on this After Visit Summary.  MyChart is used to connect with patients for Virtual Visits (Telemedicine).  Patients are able to view lab/test results, encounter notes, upcoming appointments, etc.  Non-urgent messages can be sent to your provider as well.   To learn more about what you can do with MyChart, go to [email or web address].    Your next appointment:   3 month(s)  The format for your next appointment:   In Person  Provider:   Nelva Bush, MD

## 2021-12-19 NOTE — Progress Notes (Signed)
Follow-up Outpatient Visit Date: 12/19/2021  Primary Care Provider: [name], MD [address] [place] [address]  Chief Complaint: Chest pain  HPI:  Antonio [name] is a 76 y.o. male with history of atrial fibrillation/flutter, SVT, hypertension, hyperlipidemia, ulcerative colitis, and asthma/COPD, who presents for evaluation of escalating chest pain.  I last saw him in May, at which time he had reverted back to atrial fibrillation.  Chronic dyspnea with minimal exertion was unchanged.  He subsequently saw Dr. Quentin Ore and was started on flecainide and also transition from apixaban to rivaroxaban due to difficulty taking twice daily medications consistently.  He was doing well at his last visit with Dr. Quentin Ore in September.  He presented to the [facility] emergency department last week with chest pain accompanied by a productive cough x1 week.  He had been prescribed prednisone and antibiotics by his PCP at onset of cough and yellowish sputum production.  ED work-up was unremarkable with EKG demonstrating sinus bradycardia without ischemic changes.  BNP and high-sensitivity troponin I x2 were negative.  Moderate leukocytosis was present.  His pain improved with GI cocktail; it was thought that he had developed gastritis/esophagitis in the setting of recent steroid use.  Today, Antonio [name] reports that he has not had any further chest pain.  In hindsight, he believes that eating a [employer] sandwich after having had very little to eat the preceding few days in the setting of URI brought on his chest pain.  His breathing is almost back to normal as well, though he still has a few more days of antibiotics and prednisone left.  He denies palpitations, lightheadedness, and edema.  He has used both famotidine and omeprazole in the past and inquires about which would be the best treatment option for GERD going  forward.  --------------------------------------------------------------------------------------------------  Cardiovascular History & Procedures: Cardiovascular Problems: Atrial fibrillation/flutter SVT PAD (followed by Dr. Lucky Cowboy)   Risk Factors: PAD, hyperlipidemia, male gender, age greater than 58, and prior tobacco use   Cath/PCI: None   CV Surgery: Left SFA and proximal popliteal artery stent (06/16/2012). Left SFA PTA and stent placement (10/20/2011).   EP Procedures and Devices: None   Non-Invasive Evaluation(s): Pharmacologic MPI (07/09/2021): Low risk study without ischemia or scar.  LVEF greater than 65%.  Coronary artery calcification and aortic atherosclerosis noted. TTE (03/11/2021): Normal LV size and wall thickness.  LVEF 55% with normal diastolic function.  Normal RV size and function.  Normal biatrial size.  No significant valvular abnormality.  Normal CVP. TTE (09/09/2019, [facility]): Technically difficult study.  Normal LV size and wall thickness.  LVEF 60-65% with normal diastolic function.  Normal RV size and function.  Normal biatrial size.  Mild mitral regurgitation.  Recent CV Pertinent Labs: Lab Results  Component Value Date   CHOL 175 12/17/2020   HDL 54 12/17/2020   LDLCALC 103 (H) 12/17/2020   TRIG 96 12/17/2020   CHOLHDL 2.6 12/13/2018   INR 1.3 (H) 12/13/2021   BNP 61.9 12/16/2021   K 4.3 12/13/2021   K 4.2 10/01/2013   MG 2.3 07/03/2021   BUN 17 12/13/2021   BUN 9 12/14/2019   BUN 11 10/01/2013   CREATININE 1.09 12/13/2021   CREATININE 0.93 10/01/2013    Past medical and surgical history were reviewed and updated in EPIC.  Current Meds  Medication Sig   albuterol (PROVENTIL) (2.5 MG/3ML) 0.083% nebulizer solution Take 3 mLs (2.5 mg total) by nebulization every 6 (six) hours as needed for  wheezing or shortness of breath.   albuterol (VENTOLIN HFA) 108 (90 Base) MCG/ACT inhaler Inhale 2 puffs into the lungs every 6 (six) hours as needed for wheezing  or shortness of breath.   azelastine (ASTELIN) 0.1 % nasal spray Place 1 spray into both nostrils 2 (two) times daily. Use in each nostril as directed   Budeson-Glycopyrrol-Formoterol (BREZTRI AEROSPHERE) 160-9-4.8 MCG/ACT AERO Inhale 2 puffs into the lungs in the morning and at bedtime.   Budeson-Glycopyrrol-Formoterol (BREZTRI AEROSPHERE) 160-9-4.8 MCG/ACT AERO Inhale 2 puffs into the lungs in the morning and at bedtime.   doxycycline (VIBRA-TABS) 100 MG tablet Take 1 tablet (100 mg total) by mouth 2 (two) times daily.   dupilumab (DUPIXENT) 300 MG/2ML prefilled syringe Inject 300 mg into the skin every 14 (fourteen) days.   flecainide (TAMBOCOR) 50 MG tablet Take 1.5 tablets (75 mg total) by mouth 2 (two) times daily.   guaiFENesin-codeine (ROBITUSSIN AC) 100-10 MG/5ML syrup Take 5 mLs by mouth 3 (three) times daily as needed for cough.   hydrocortisone 2.5 % cream Apply 1 application. topically as needed.   ipratropium (ATROVENT) 0.02 % nebulizer solution Take 2.5 mLs (0.5 mg total) by nebulization 4 (four) times daily.   mesalamine (LIALDA) 1.2 g EC tablet TAKE 4 TABLETS BY MOUTH DAILY WITH BREAKFAST   metoprolol succinate (TOPROL-XL) 25 MG 24 hr tablet TAKE 1 TABLET (25 MG TOTAL) BY MOUTH DAILY.   ondansetron (ZOFRAN-ODT) 4 MG disintegrating tablet Take 1 tablet (4 mg total) by mouth every 8 (eight) hours as needed for nausea or vomiting.   predniSONE (DELTASONE) 10 MG tablet Take 6 tabs p.o. on day 1 and decrease by 1 tablet daily until complete   rivaroxaban (XARELTO) 20 MG TABS tablet Take 1 tablet (20 mg total) by mouth daily with supper.   rosuvastatin (CRESTOR) 10 MG tablet Take 1 tablet (10 mg total) by mouth daily.    Allergies: Penicillins  Social History   Tobacco Use   Smoking status: Former    Packs/day: 0.50    Years: 35.00    Total pack years: 17.50    Types: Cigarettes, Cigars    Quit date: 06/11/2011    Years since quitting: 10.5   Smokeless tobacco: Never   Tobacco  comments:    Former smoker 12/19/2020  Vaping Use   Vaping Use: Former  Substance Use Topics   Alcohol use: Yes    Alcohol/week: 6.0 standard drinks of alcohol    Types: 6 Shots of liquor per week    Comment: wine, liquor, or beer   Drug use: No    Family History  Problem Relation Age of Onset   Cancer Mother        leukemia   Colon polyps Mother    Heart disease Father    Heart attack Father 81   Healthy Daughter    Healthy Son    Colon cancer Neg Hx    Esophageal cancer Neg Hx    Rectal cancer Neg Hx    Stomach cancer Neg Hx     Review of Systems: A 12-system review of systems was performed and was negative except as noted in the HPI.  --------------------------------------------------------------------------------------------------  Physical Exam: BP 110/60 (BP Location: Left Arm, Patient Position: Sitting, Cuff Size: Large)   Pulse 60   Ht 5' 10"  (1.778 m)   Wt 149 lb (67.6 kg)   BMI 21.38 kg/m   General:  NAD. Neck: No JVD or HJR. Lungs: Clear to auscultation bilaterally without  wheezes or crackles. Heart: Regular rate and rhythm without murmurs, rubs, or gallops. Abdomen: Soft, nontender, nondistended. Extremities: No lower extremity edema.  EKG: Normal sinus rhythm with rightward axis.  No significant change from prior tracing on 12/13/2021.  Lab Results  Component Value Date   WBC 16.3 (H) 12/13/2021   HGB 14.9 12/13/2021   HCT 46.1 12/13/2021   MCV 92.4 12/13/2021   PLT 236 12/13/2021    Lab Results  Component Value Date   NA 141 12/13/2021   K 4.3 12/13/2021   CL 107 12/13/2021   CO2 27 12/13/2021   BUN 17 12/13/2021   CREATININE 1.09 12/13/2021   GLUCOSE 109 (H) 12/13/2021   ALT 17 12/13/2021    Lab Results  Component Value Date   CHOL 175 12/17/2020   HDL 54 12/17/2020   LDLCALC 103 (H) 12/17/2020   TRIG 96 12/17/2020   CHOLHDL 2.6 12/13/2018     --------------------------------------------------------------------------------------------------  ASSESSMENT AND PLAN: Atypical chest pain: Recent episode of chest pain most consistent with GERD/esophagitis, as it occurred after eating a large sandwich and resolved with GI cocktail.  We discussed famotidine and omeprazole as treatment options.  Given potential for long-term side effects with prolonged omeprazole use, I have suggested that Antonio [name] try using famotidine as needed for recurrent symptoms.  If he has frequent recurrence these or persistent symptoms, omeprazole may be a better option.  No plans for further ischemia evaluation given normal myocardial perfusion stress test earlier this year and atypical nature of pain.  Paroxysmal atrial fibrillation: EKG today demonstrates normal sinus rhythm.  He has not had any recent palpitations to suggest recurrent atrial fibrillation.  We will plan to continue low-dose metoprolol as well as anticoagulation with apixaban.  COPD: Antonio [name] reports recent exacerbation, though his breathing is almost back to baseline.  Ongoing follow-up with Drs. Vonda Antigua.  PAD: No claudication reported today.  Antonio [name] is tolerating rosuvastatin well.  Recommend repeating lipid panel to assess response at his next PCP visit with lab draw or at our next visit in 3 months.  Ongoing follow-up with vascular surgery.  Follow-up: Return to clinic in 3 months.  Nelva Bush, MD 12/19/2021 10:45 AM

## 2021-12-20 ENCOUNTER — Other Ambulatory Visit: Payer: Self-pay

## 2021-12-20 ENCOUNTER — Telehealth: Payer: Self-pay | Admitting: Internal Medicine

## 2021-12-20 ENCOUNTER — Emergency Department
Admission: EM | Admit: 2021-12-20 | Discharge: 2021-12-20 | Disposition: A | Discharge status: Home / Self Care | Payer: PPO | Attending: Emergency Medicine | Admitting: Emergency Medicine

## 2021-12-20 DIAGNOSIS — R1030 Lower abdominal pain, unspecified: Secondary | ICD-10-CM | POA: Insufficient documentation

## 2021-12-20 DIAGNOSIS — R339 Retention of urine, unspecified: Secondary | ICD-10-CM | POA: Diagnosis not present

## 2021-12-20 DIAGNOSIS — K5900 Constipation, unspecified: Secondary | ICD-10-CM | POA: Diagnosis not present

## 2021-12-20 LAB — BASIC METABOLIC PANEL
Anion gap: 7 (ref 5–15)
BUN: 17 mg/dL (ref 8–23)
CO2: 25 mmol/L (ref 22–32)
Calcium: 9.4 mg/dL (ref 8.9–10.3)
Chloride: 106 mmol/L (ref 98–111)
Creatinine, Ser: 0.91 mg/dL (ref 0.61–1.24)
GFR, Estimated: >60 mL/min (ref >60)
Glucose, Bld: 110 mg/dL — ABNORMAL HIGH (ref 70–99)
Potassium: 4.7 mmol/L (ref 3.5–5.1)
Sodium: 138 mmol/L (ref 135–145)

## 2021-12-20 LAB — CBC
HCT: 41.6 % (ref 39.0–52.0)
Hemoglobin: 13.8 g/dL (ref 13.0–17.0)
MCH: 30.7 pg (ref 26.0–34.0)
MCHC: 33.2 g/dL (ref 30.0–36.0)
MCV: 92.7 fL (ref 80.0–100.0)
Platelets: 239 10*3/uL (ref 150–400)
RBC: 4.49 MIL/uL (ref 4.22–5.81)
RDW: 11.7 % (ref 11.5–15.5)
WBC: 20.3 10*3/uL — ABNORMAL HIGH (ref 4.0–10.5)
nRBC: 0 % (ref 0.0–0.2)

## 2021-12-20 LAB — URINALYSIS, ROUTINE W REFLEX MICROSCOPIC
Bilirubin Urine: NEGATIVE
Glucose, UA: NEGATIVE mg/dL
Hgb urine dipstick: NEGATIVE
Ketones, ur: NEGATIVE mg/dL
Leukocytes,Ua: NEGATIVE
Nitrite: NEGATIVE
Protein, ur: NEGATIVE mg/dL
Specific Gravity, Urine: 1.029 (ref 1.005–1.030)
pH: 5 (ref 5.0–8.0)

## 2021-12-20 MED ORDER — LACTULOSE 10 G PO PACK: 10.0000 g | PACK | Freq: Two times a day (BID) | ORAL | 0 refills | Status: AC

## 2021-12-20 NOTE — Discharge Instructions (Signed)
Please seek medical attention for any high fevers, chest pain, shortness of breath, change in behavior, persistent vomiting, bloody stool or any other new or concerning symptoms.  

## 2021-12-20 NOTE — ED Triage Notes (Addendum)
Pt from home, c/o urinary retention since yesterday afternoon. Pt had full work up w/ cardiology last weekend and yesterday per wife. Pt SHOB- states this is chronic, and states he took several puffs of "boost" oxygen on car ride here. Pt has hx of COPD and is on Xeralto. 1+ pitting edema noted bilaterally, cough noted, respirations even, labored. Pt has also been constipated per wife despite Miralax. Pt reports lower pelvic pain. Pt AOX4.

## 2021-12-20 NOTE — ED Notes (Signed)
Pt's signing pad did not work. Pt verbalized understanding of DC instructions. Pt and wife had a return demonstration of taking care of the urinary catheter and verbalized understanding.

## 2021-12-20 NOTE — ED Provider Notes (Signed)
[facility] Provider Note    Event Date/Time   First MD Initiated Contact with Patient 12/20/21 1807     (approximate)   History   Urinary Retention   HPI  Antonio [name] is a 76 y.o. male  who presents to the emergency department today because of concern for urinary retention. The patient states that he has not urinated since yesterday. The patient has had discomfort in the lower abdomen. The patient denies any recent changes in urination. Additionally the patient has been having issues with constipation over the past few days. Has been trying multiple laxatives at home without any relief.       Physical Exam   Triage Vital Signs: ED Triage Vitals [12/20/21 1755]  Enc Vitals Group     BP (!) 144/87     Pulse Rate 84     Resp (!) 22     Temp 97.9 F (36.6 C)     Temp Source Oral     SpO2 94 %     Weight      Height      Head Circumference      Peak Flow      Pain Score 4     Pain Loc      Pain Edu?      Excl. in [place]?     Most recent vital signs: Vitals:   12/20/21 1755  BP: (!) 144/87  Pulse: 84  Resp: (!) 22  Temp: 97.9 F (36.6 C)  SpO2: 94%   General: Awake, alert, oriented. CV:  Good peripheral perfusion. Regular rate and rhythm. Resp:  Normal effort. Lungs clear. Abd:  No distention. Minimally tender in the lower abdomen.    ED Results / Procedures / Treatments   Labs (all labs ordered are listed, but only abnormal results are displayed) Labs Reviewed  URINALYSIS, ROUTINE W REFLEX MICROSCOPIC - Abnormal; Notable for the following components:      Result Value   Color, Urine YELLOW (*)    APPearance CLEAR (*)    All other components within normal limits  BASIC METABOLIC PANEL - Abnormal; Notable for the following components:   Glucose, Bld 110 (*)    All other components within normal limits  CBC - Abnormal; Notable for the following components:   WBC 20.3 (*)    All other components within normal limits      EKG  None   RADIOLOGY None   PROCEDURES:  Critical Care performed: No  Procedures   MEDICATIONS ORDERED IN ED: Medications - No data to display   IMPRESSION / MDM / [place] / ED COURSE  I reviewed the triage vital signs and the nursing notes.                              Differential diagnosis includes, but is not limited to, infection, obstruction.  Patient's presentation is most consistent with acute presentation with potential threat to life or bodily function.  Patient resented to the emergency department today because of concerns for lack of urination throughout the day.  Patient does have some discomfort in his lower abdomen is minimally tender there.  Additionally patient has complaints of constipation.  Blood work without concerning patient of creatinine.  Foley catheter was placed.  Urine without signs of infection.  This time I do wonder if constipation is causing the urinary retention.  No fecal impaction on exam.  Patient was given an enema here.  He did feel better after Foley catheter was placed.  We will plan on discharging with leg bag and urology follow-up information.  FINAL CLINICAL IMPRESSION(S) / ED DIAGNOSES   Final diagnoses:  Urinary retention  Constipation, unspecified constipation type     Note:  This document was prepared using Dragon voice recognition software and may include unintentional dictation errors.    Nance Pear, MD 12/20/21 [phone]

## 2021-12-20 NOTE — ED Notes (Signed)
ED Provider at bedside. This RN chaperoned a rectal exam.

## 2021-12-20 NOTE — ED Notes (Signed)
Bladder scanner reading 321 ml's urine at this time

## 2021-12-20 NOTE — Telephone Encounter (Signed)
Attempted to contact pt, but they do not accept blocked #s. Will have someone from the office contact them.

## 2021-12-20 NOTE — Telephone Encounter (Signed)
Wife called stating patient has not been able to urinate or have a bowel movement today.  Wife stated the patient has taken a small dose Linzess and Miralax and neither medication has worked.  Wife stated the patient has not eaten anything since yesterday.  Wife would like to advice on next steps.

## 2021-12-21 ENCOUNTER — Encounter: Payer: Self-pay | Admitting: Internal Medicine

## 2021-12-21 DIAGNOSIS — R0789 Other chest pain: Secondary | ICD-10-CM | POA: Insufficient documentation

## 2021-12-23 NOTE — Telephone Encounter (Signed)
Pt went to ED 12/20/21.

## 2021-12-24 ENCOUNTER — Telehealth: Payer: Self-pay | Admitting: Internal Medicine

## 2021-12-24 NOTE — Telephone Encounter (Signed)
I would let it flow for several days before trying any thing to stop bowels up.  This is a post laxative effect and should settle out in the coming days.  If not he can call back.  Stay hydrated

## 2021-12-24 NOTE — Telephone Encounter (Signed)
Patient called and requested to leave a message to a nurse although he has not been seen since 2022. The patient stated he was seen at ED and is experiencing a lot of loose BM's to the point that he has to wear depends and he is seeking further advise. I offered for him to follow up in the office and he declined because of his symptoms and not being able to have bathroom available when he needs to go.

## 2021-12-24 NOTE — Telephone Encounter (Signed)
Pt was seen recently in the ER for constipation and urinary retention. He is also on doxycycline for URI, he is about to finish it. He had a cath placed in the ER and went home with it in place. Supposed to come out Thursday. Before he went to the ER he took 2 doses of miralax and tool a pill laxative his wife had. In the ER he was given an enema. Now pt states he has had several BM's and now cannot stop. He reports they are the consistency of pudding and is having about 3/day, reports no blood. He states he is taking his Lialda. Pt wants to know what Dr. Hilarie Fredrickson suggests. Please advise.

## 2021-12-25 NOTE — Telephone Encounter (Signed)
Spoke with pt and he is aware of Dr. Vena Rua recommendations.

## 2021-12-26 ENCOUNTER — Ambulatory Visit: Payer: PPO | Admitting: Urology

## 2021-12-26 ENCOUNTER — Ambulatory Visit (INDEPENDENT_AMBULATORY_CARE_PROVIDER_SITE_OTHER): Payer: PPO | Admitting: Physician Assistant

## 2021-12-26 ENCOUNTER — Encounter: Payer: Self-pay | Admitting: Urology

## 2021-12-26 VITALS — BP 117/71 | HR 61 | Ht 70.0 in | Wt 149.0 lb

## 2021-12-26 DIAGNOSIS — R339 Retention of urine, unspecified: Secondary | ICD-10-CM

## 2021-12-26 LAB — BLADDER SCAN AMB NON-IMAGING: Scan Result: 222

## 2021-12-26 MED ORDER — CEPHALEXIN 250 MG PO CAPS
500.0000 mg | ORAL_CAPSULE | Freq: Once | ORAL | Status: AC
  Administered 2021-12-26: 500 mg via ORAL

## 2021-12-26 MED ORDER — TAMSULOSIN HCL 0.4 MG PO CAPS: 0.4000 mg | ORAL_CAPSULE | Freq: Every day | ORAL | 0 refills | Status: DC

## 2021-12-26 NOTE — Progress Notes (Signed)
Fill and Pull Catheter Removal  Patient is present today for a catheter removal.  Patient was cleaned and prepped in a sterile fashion 394m of sterile water/ saline was instilled into the bladder when the patient felt the urge to urinate. 869mof water was then drained from the balloon.  A 16FR foley cath was removed from the bladder no complications were noted .  Patient as then given some time to void on their own.  Patient cannot void.  Patient tolerated well.  Performed by: S.Shoua Ressler, CMA

## 2021-12-26 NOTE — Progress Notes (Signed)
12/26/2021 10:38 AM   Antonio [name] December 03, 1945 [id number]  Referring provider: [name], MD [address] [place] [address],  [place] [address]  Urinary retention   HPI: 76 year old male with a personal history of multiple medical issues including cardiovascular disease, COPD and ulcerative colitis who presents today after developing an episode of urinary retention.  He was seen and evaluated in the emergency room on 12/20/2021 with inability to void since the previous day.  He had lower abdominal distention and discomfort.  During this time, he is also having a bout of constipation which may have exacerbated his urinary issues.  Foley catheter was placed and he was discharged with outpatient urologic follow-up.  Notably in the ER, he did have a markedly elevated white count of 20 (on prednisone), other labs are otherwise unremarkable including his urinalysis.  He has no prior history of urinary retention.  He mentions prior to this episode, he had no issues with voiding.  Occasionally, he have a little hesitancy getting his stream started but he always felt like he emptied without urgency frequency or nocturia.  He is on no BPH medications.  PSA 0.5 on 11/22  He had been taking abx/ prenisode and codeine cough suppresent for a COPD exacerbation.    He mentions today that his bowel movements have started to normalize.  He is now making soft mushy well-formed stools.  He has been in contact with his gastroenterologist.   PMH: Past Medical History:  Diagnosis Date   Allergy    Calf pain    COPD (chronic obstructive pulmonary disease) (HCC)    no Oxygen per pt   COVID-19    Dyspnea    Hypokalemia    Internal hemorrhoids    Internal hemorrhoids    Muscle cramp    Muscle pain    PAD (peripheral artery disease) (HCC)    Paroxysmal atrial fibrillation ([employer]) 08/2019   Pneumonia    PONV (postoperative nausea and vomiting)    Tubular adenoma of colon    Ulcerative  colitis ([place])     Surgical History: Past Surgical History:  Procedure Laterality Date   ANTERIOR CERVICAL DECOMP/DISCECTOMY FUSION N/A 05/05/2016   Procedure: ANTERIOR CERVICAL DECOMPRESSION FUSION CERVICAL THREE-FOUR.;  Surgeon: Earnie Larsson, MD;  Location: [facility];  Service: Neurosurgery;  Laterality: N/A;  right side approach   CATARACT EXTRACTION Bilateral    CERVICAL DISCECTOMY  04/2016   C3-C4   COLONOSCOPY     COLONOSCOPY  08/09/2015   HEMORRHOID SURGERY  1988   leg stent Left approx 5-6 yrs ago   2 to left leg   MENISCUS REPAIR Left 12/2013   MOHS SURGERY     POLYPECTOMY     ROTATOR CUFF REPAIR Left 2007   TONSILLECTOMY AND ADENOIDECTOMY  1957    Home Medications:  Allergies as of 12/26/2021       Reactions   Penicillins Rash   Has patient had a PCN reaction causing immediate rash, facial/tongue/throat swelling, SOB or lightheadedness with hypotension:Yes Has patient had a PCN reaction causing severe rash involving mucus membranes or skin necrosis:Yes Has patient had a PCN reaction that required hospitalization:No Has patient had a PCN reaction occurring within the last 10 years:No If all of the above answers are "NO", then may proceed with Cephalosporin use.        Medication List        Accurate as of December 26, 2021 10:38 AM. If you have any questions, ask your  nurse or doctor.          albuterol 108 (90 Base) MCG/ACT inhaler Commonly known as: VENTOLIN HFA Inhale 2 puffs into the lungs every 6 (six) hours as needed for wheezing or shortness of breath.   albuterol (2.5 MG/3ML) 0.083% nebulizer solution Commonly known as: PROVENTIL Take 3 mLs (2.5 mg total) by nebulization every 6 (six) hours as needed for wheezing or shortness of breath.   azelastine 0.1 % nasal spray Commonly known as: ASTELIN Place 1 spray into both nostrils 2 (two) times daily. Use in each nostril as directed   Breztri Aerosphere 160-9-4.8 MCG/ACT Aero Generic drug:  Budeson-Glycopyrrol-Formoterol Inhale 2 puffs into the lungs in the morning and at bedtime.   Breztri Aerosphere 160-9-4.8 MCG/ACT Aero Generic drug: Budeson-Glycopyrrol-Formoterol Inhale 2 puffs into the lungs in the morning and at bedtime.   doxycycline 100 MG tablet Commonly known as: VIBRA-TABS Take 1 tablet (100 mg total) by mouth 2 (two) times daily.   Dupixent 300 MG/2ML prefilled syringe Generic drug: dupilumab Inject 300 mg into the skin every 14 (fourteen) days.   EPINEPHrine 0.3 mg/0.3 mL Soaj injection Commonly known as: EPI-PEN Inject 0.3 mg into the muscle as needed for anaphylaxis.   famotidine 20 MG tablet Commonly known as: PEPCID Take 1 tablet (20 mg total) by mouth 2 (two) times daily as needed for heartburn or indigestion.   flecainide 50 MG tablet Commonly known as: TAMBOCOR Take 1.5 tablets (75 mg total) by mouth 2 (two) times daily.   guaiFENesin-codeine 100-10 MG/5ML syrup Commonly known as: ROBITUSSIN AC Take 5 mLs by mouth 3 (three) times daily as needed for cough.   hydrocortisone 2.5 % cream Apply 1 application. topically as needed.   ipratropium 0.02 % nebulizer solution Commonly known as: ATROVENT Take 2.5 mLs (0.5 mg total) by nebulization 4 (four) times daily.   mesalamine 1.2 g EC tablet Commonly known as: LIALDA TAKE 4 TABLETS BY MOUTH DAILY WITH BREAKFAST   metoprolol succinate 25 MG 24 hr tablet Commonly known as: TOPROL-XL TAKE 1 TABLET (25 MG TOTAL) BY MOUTH DAILY.   ondansetron 4 MG disintegrating tablet Commonly known as: ZOFRAN-ODT Take 1 tablet (4 mg total) by mouth every 8 (eight) hours as needed for nausea or vomiting.   predniSONE 10 MG tablet Commonly known as: DELTASONE Take 6 tabs p.o. on day 1 and decrease by 1 tablet daily until complete   rivaroxaban 20 MG Tabs tablet Commonly known as: XARELTO Take 1 tablet (20 mg total) by mouth daily with supper.   rosuvastatin 10 MG tablet Commonly known as:  CRESTOR Take 1 tablet (10 mg total) by mouth daily.        Allergies:  Allergies  Allergen Reactions   Penicillins Rash    Has patient had a PCN reaction causing immediate rash, facial/tongue/throat swelling, SOB or lightheadedness with hypotension:Yes Has patient had a PCN reaction causing severe rash involving mucus membranes or skin necrosis:Yes Has patient had a PCN reaction that required hospitalization:No Has patient had a PCN reaction occurring within the last 10 years:No If all of the above answers are "NO", then may proceed with Cephalosporin use.     Family History: Family History  Problem Relation Age of Onset   Cancer Mother        leukemia   Colon polyps Mother    Heart disease Father    Heart attack Father 71   Healthy Daughter    Healthy Son    Colon cancer Neg  Hx    Esophageal cancer Neg Hx    Rectal cancer Neg Hx    Stomach cancer Neg Hx     Social History:  reports that he quit smoking about 10 years ago. His smoking use included cigarettes and cigars. He has a 17.50 pack-year smoking history. He has never used smokeless tobacco. He reports current alcohol use of about 6.0 standard drinks of alcohol per week. He reports that he does not use drugs.   Physical Exam: BP 117/71   Pulse 61   Ht 5' 10"  (1.778 m)   Wt 149 lb (67.6 kg)   BMI 21.38 kg/m   Constitutional:  Alert and oriented, No acute distress.  Accompanied by his wife today. HEENT: [place] AT, moist mucus membranes.  Trachea midline, no masses. Cardiovascular: No clubbing, cyanosis, or edema. Respiratory: Normal respiratory effort, no increased work of breathing. Skin: No rashes, bruises or suspicious lesions. Neurologic: Grossly intact, no focal deficits, moving all 4 extremities. Psychiatric: Normal mood and affect.  Laboratory Data: Lab Results  Component Value Date   WBC 20.3 (H) 12/20/2021   HGB 13.8 12/20/2021   HCT 41.6 12/20/2021   MCV 92.7 12/20/2021   PLT 239 12/20/2021     Lab Results  Component Value Date   CREATININE 0.91 12/20/2021    Urinalysis    Component Value Date/Time   COLORURINE YELLOW (A) 12/20/2021 1910   APPEARANCEUR CLEAR (A) 12/20/2021 1910   LABSPEC 1.029 12/20/2021 1910   PHURINE 5.0 12/20/2021 1910   GLUCOSEU NEGATIVE 12/20/2021 1910   HGBUR NEGATIVE 12/20/2021 1910   BILIRUBINUR NEGATIVE 12/20/2021 1910   BILIRUBINUR negative 12/14/2019 0949   KETONESUR NEGATIVE 12/20/2021 1910   PROTEINUR NEGATIVE 12/20/2021 1910   UROBILINOGEN 0.2 12/14/2019 0949   NITRITE NEGATIVE 12/20/2021 1910   LEUKOCYTESUR NEGATIVE 12/20/2021 1910     Assessment & Plan:    1. Urinary retention Urinary retention likely in the setting of possible underlying BPH with previous history of urinary hesitancy exacerbated by constipation and narcotic cough syrup  We attempted a voiding trial today, he was instilled with 300 cc of fluid along with given additional oral water but unable to void, not uncomfortable.  Ultimately, we will plan to send him home with return later this afternoon to see if he is able to void on his own, check a PVR possibly replace catheter if he fails to void.  We discussed that if he continues to have urinary retention, likely attempt a voiding trial again next week, if he fails again, learn CIC and consideration of outlet procedure.  He also need a rectal exam, PSA screening is recent enough that is unlikely that prostate cancer is contributing factor.  We discussed the pathophysiology of urinary retention as outlined above.  Advised to avoid cough suppressant and constipation.  He was given a single dose of Keflex x1 for prophylaxis in the setting of prolonged Foley catheter. - cephALEXin (KEFLEX) capsule 500 mg  2.  BPH Will eventually need rectal exam, consider the addition of Flomax especially fails voiding trial this afternoon  Return later this afternoon for PVR, possible Foley catheter placement    Hollice Espy,  MD  [facility] [address] [place], [place] [address] [phone]  I spent 46 total minutes on the day of the encounter including pre-visit review of the medical record, face-to-face time with the patient, and post visit ordering of labs/imaging/tests.

## 2021-12-26 NOTE — Progress Notes (Signed)
Patient returns to clinic this afternoon.  He has voided 3 times with a volume of greater than 700 mL of output.  PVR 222 mL.  He denies abdominal pain or difficulty voiding.  Results for orders placed or performed in visit on 12/26/21  Bladder Scan (Post Void Residual) in office  Result Value Ref Range   Scan Result 222     He is interested in a trial of Flomax.  We discussed orthostasis side effects and I counseled him to stop the medication and contact me if he develops these.  We will plan to see him back in 1 month for symptom recheck, IPSS, PVR, and DRE.  He is in agreement with this plan.

## 2021-12-27 ENCOUNTER — Inpatient Hospital Stay: Payer: PPO | Admitting: Family Medicine

## 2021-12-30 ENCOUNTER — Ambulatory Visit (INDEPENDENT_AMBULATORY_CARE_PROVIDER_SITE_OTHER): Payer: PPO | Admitting: Family Medicine

## 2021-12-30 ENCOUNTER — Encounter: Payer: Self-pay | Admitting: Pulmonary Disease

## 2021-12-30 ENCOUNTER — Encounter: Payer: Self-pay | Admitting: Family Medicine

## 2021-12-30 ENCOUNTER — Ambulatory Visit (INDEPENDENT_AMBULATORY_CARE_PROVIDER_SITE_OTHER): Payer: PPO | Admitting: Pulmonary Disease

## 2021-12-30 ENCOUNTER — Telehealth: Payer: Self-pay | Admitting: Physician Assistant

## 2021-12-30 VITALS — BP 120/60 | HR 57 | Ht 70.0 in | Wt 145.0 lb

## 2021-12-30 VITALS — BP 110/70 | HR 75 | Temp 97.8°F | Ht 70.0 in | Wt 144.0 lb

## 2021-12-30 DIAGNOSIS — J432 Centrilobular emphysema: Secondary | ICD-10-CM

## 2021-12-30 DIAGNOSIS — K219 Gastro-esophageal reflux disease without esophagitis: Secondary | ICD-10-CM | POA: Diagnosis not present

## 2021-12-30 DIAGNOSIS — J439 Emphysema, unspecified: Secondary | ICD-10-CM

## 2021-12-30 DIAGNOSIS — J4550 Severe persistent asthma, uncomplicated: Secondary | ICD-10-CM

## 2021-12-30 DIAGNOSIS — J449 Chronic obstructive pulmonary disease, unspecified: Secondary | ICD-10-CM | POA: Diagnosis not present

## 2021-12-30 DIAGNOSIS — I480 Paroxysmal atrial fibrillation: Secondary | ICD-10-CM

## 2021-12-30 DIAGNOSIS — R339 Retention of urine, unspecified: Secondary | ICD-10-CM

## 2021-12-30 DIAGNOSIS — G4736 Sleep related hypoventilation in conditions classified elsewhere: Secondary | ICD-10-CM | POA: Diagnosis not present

## 2021-12-30 MED ORDER — SYMBICORT 160-4.5 MCG/ACT IN AERO: 2.0000 | INHALATION_SPRAY | Freq: Two times a day (BID) | RESPIRATORY_TRACT | 12 refills | Status: DC

## 2021-12-30 NOTE — Progress Notes (Signed)
Date:  12/30/2021   Name:  Antonio [name]   DOB:  25-Sep-1945   MRN:  [id number]   Chief Complaint: follow up urine retention (Saw urology on 12/26/21- will see urology in a month- is able to pee on his own)  Shortness of Breath This is a chronic problem. The current episode started more than 1 month ago. The problem occurs intermittently. The problem has been waxing and waning. Pertinent negatives include no abdominal pain, chest pain, ear pain, fever, headaches, leg swelling, neck pain, orthopnea, PND, rash, sore throat, sputum production, swollen glands, syncope or wheezing. The treatment provided mild relief. There is no history of allergies, aspirin allergies, asthma, bronchiolitis, CAD, chronic lung disease, COPD, DVT, a heart failure, PE, pneumonia or a recent surgery.    Lab Results  Component Value Date   NA 138 12/20/2021   K 4.7 12/20/2021   CO2 25 12/20/2021   GLUCOSE 110 (H) 12/20/2021   BUN 17 12/20/2021   CREATININE 0.91 12/20/2021   CALCIUM 9.4 12/20/2021   GFRNONAA >60 12/20/2021   Lab Results  Component Value Date   CHOL 175 12/17/2020   HDL 54 12/17/2020   LDLCALC 103 (H) 12/17/2020   TRIG 96 12/17/2020   CHOLHDL 2.6 12/13/2018   Lab Results  Component Value Date   TSH 1.783 07/03/2021   No results found for: "HGBA1C" Lab Results  Component Value Date   WBC 20.3 (H) 12/20/2021   HGB 13.8 12/20/2021   HCT 41.6 12/20/2021   MCV 92.7 12/20/2021   PLT 239 12/20/2021   Lab Results  Component Value Date   ALT 17 12/13/2021   AST 22 12/13/2021   ALKPHOS 69 12/13/2021   BILITOT 0.7 12/13/2021   No results found for: "25OHVITD2", "25OHVITD3", "VD25OH"   Review of Systems  Constitutional:  Negative for chills and fever.  HENT:  Negative for drooling, ear discharge, ear pain and sore throat.   Respiratory:  Positive for shortness of breath. Negative for cough, sputum production and wheezing.   Cardiovascular:  Negative for chest pain,  palpitations, orthopnea, leg swelling, syncope and PND.  Gastrointestinal:  Negative for abdominal pain, blood in stool, constipation, diarrhea and nausea.  Endocrine: Negative for polydipsia.  Genitourinary:  Negative for dysuria, frequency, hematuria and urgency.  Musculoskeletal:  Negative for back pain, myalgias and neck pain.  Skin:  Negative for rash.  Allergic/Immunologic: Negative for environmental allergies.  Neurological:  Negative for dizziness and headaches.  Hematological:  Does not bruise/bleed easily.  Psychiatric/Behavioral:  Negative for suicidal ideas. The patient is not nervous/anxious.     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   PAD (peripheral artery disease) ([place]) 11/08/2019   Paroxysmal atrial fibrillation ([place]) 09/02/2019   Stiffness of finger joint of left hand 04/06/2019   History of COVID-19 01/20/2019   Severe persistent asthma without complication 40/97/3532   Medication management 09/20/2018   Chronic obstructive pulmonary disease ([place]) 09/20/2018   Increased eosinophils in the blood 06/21/2018   Diffusion capacity of lung (dl), decreased 03/11/2018   COPD with acute exacerbation ([place]) 06/22/2017   Atrial flutter, paroxysmal ([place]) 10/17/2016   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/2016   PSVT (paroxysmal supraventricular tachycardia) 10/17/2016   Ulcerative colitis with complication ([place]) 99/24/2683   Cervical stenosis of spinal canal 05/05/2016   Carotid stenosis 05/02/2016  Atherosclerosis of native arteries of extremity with intermittent claudication ([place]) 05/02/2016   Cramping of hands 01/10/2016   Bilateral carpal tunnel syndrome 12/06/2015   Centrilobular Emphysema 04/25/2014   Former smoker 04/25/2014   Tobacco abuse 04/25/2014    Allergies  Allergen Reactions   Penicillins Rash    Has  patient had a PCN reaction causing immediate rash, facial/tongue/throat swelling, SOB or lightheadedness with hypotension:Yes Has patient had a PCN reaction causing severe rash involving mucus membranes or skin necrosis:Yes Has patient had a PCN reaction that required hospitalization:No Has patient had a PCN reaction occurring within the last 10 years:No If all of the above answers are "NO", then may proceed with Cephalosporin use.     Past Surgical History:  Procedure Laterality Date   ANTERIOR CERVICAL DECOMP/DISCECTOMY FUSION N/A 05/05/2016   Procedure: ANTERIOR CERVICAL DECOMPRESSION FUSION CERVICAL THREE-FOUR.;  Surgeon: Earnie Larsson, MD;  Location: [facility];  Service: Neurosurgery;  Laterality: N/A;  right side approach   CATARACT EXTRACTION Bilateral    CERVICAL DISCECTOMY  04/2016   C3-C4   COLONOSCOPY     COLONOSCOPY  08/09/2015   HEMORRHOID SURGERY  1988   leg stent Left approx 5-6 yrs ago   2 to left leg   MENISCUS REPAIR Left 12/2013   MOHS SURGERY     POLYPECTOMY     ROTATOR CUFF REPAIR Left 2007   TONSILLECTOMY AND ADENOIDECTOMY  1957    Social History   Tobacco Use   Smoking status: Former    Packs/day: 0.50    Years: 35.00    Total pack years: 17.50    Types: Cigarettes, Cigars    Quit date: 06/11/2011    Years since quitting: 10.5   Smokeless tobacco: Never   Tobacco comments:    Former smoker 12/19/2020  Vaping Use   Vaping Use: Former  Substance Use Topics   Alcohol use: Yes    Alcohol/week: 6.0 standard drinks of alcohol    Types: 6 Shots of liquor per week    Comment: wine, liquor, or beer   Drug use: No     Medication list has been reviewed and updated.  Current Meds  Medication Sig   albuterol (PROVENTIL) (2.5 MG/3ML) 0.083% nebulizer solution Take 3 mLs (2.5 mg total) by nebulization every 6 (six) hours as needed for wheezing or shortness of breath.   albuterol (VENTOLIN HFA) 108 (90 Base) MCG/ACT inhaler Inhale 2 puffs into the lungs every 6  (six) hours as needed for wheezing or shortness of breath.   azelastine (ASTELIN) 0.1 % nasal spray Place 1 spray into both nostrils 2 (two) times daily. Use in each nostril as directed   dupilumab (DUPIXENT) 300 MG/2ML prefilled syringe Inject 300 mg into the skin every 14 (fourteen) days.   famotidine (PEPCID) 20 MG tablet Take 1 tablet (20 mg total) by mouth 2 (two) times daily as needed for heartburn or indigestion.   flecainide (TAMBOCOR) 50 MG tablet Take 1.5 tablets (75 mg total) by mouth 2 (two) times daily.   hydrocortisone 2.5 % cream Apply 1 application. topically as needed.   ipratropium (ATROVENT) 0.02 % nebulizer solution Take 2.5 mLs (0.5 mg total) by nebulization 4 (four) times daily.   mesalamine (LIALDA) 1.2 g EC tablet TAKE 4 TABLETS BY MOUTH DAILY WITH BREAKFAST   metoprolol succinate (TOPROL-XL) 25 MG 24 hr tablet TAKE 1 TABLET (25 MG TOTAL) BY MOUTH DAILY.   ondansetron (ZOFRAN-ODT) 4 MG disintegrating tablet Take 1 tablet (4 mg total)  by mouth every 8 (eight) hours as needed for nausea or vomiting.   rivaroxaban (XARELTO) 20 MG TABS tablet Take 1 tablet (20 mg total) by mouth daily with supper.   rosuvastatin (CRESTOR) 10 MG tablet Take 1 tablet (10 mg total) by mouth daily.   SYMBICORT 160-4.5 MCG/ACT inhaler Inhale 2 puffs into the lungs 2 (two) times daily.   tamsulosin (FLOMAX) 0.4 MG CAPS capsule Take 1 capsule (0.4 mg total) by mouth daily.       12/30/2021    3:00 PM 12/16/2021   11:39 AM 12/17/2020    8:45 AM 12/14/2019    9:21 AM  GAD 7 : Generalized Anxiety Score  Nervous, Anxious, on Edge 0 0 0 0  Control/stop worrying 0 0 0 0  Worry too much - different things 0 0 0 0  Trouble relaxing 0 0 0 0  Restless 0 0 0 0  Easily annoyed or irritable 0 0 0 0  Afraid - awful might happen 0 0 0 0  Total GAD 7 Score 0 0 0 0  Anxiety Difficulty Not difficult at all Not difficult at all         12/30/2021    3:00 PM 12/16/2021   11:39 AM 12/17/2020    8:45 AM   Depression screen PHQ 2/9  Decreased Interest 0 0 0  Down, Depressed, Hopeless 0 0 0  PHQ - 2 Score 0 0 0  Altered sleeping 0 0 0  Tired, decreased energy 0 0 0  Change in appetite 0 0 0  Feeling bad or failure about yourself  0 0 0  Trouble concentrating 0 0 0  Moving slowly or fidgety/restless 0 0 0  Suicidal thoughts 0 0 0  PHQ-9 Score 0 0 0  Difficult doing work/chores Not difficult at all Not difficult at all     BP Readings from Last 3 Encounters:  12/30/21 120/60  12/30/21 110/70  12/26/21 117/71    Physical Exam Vitals and nursing note reviewed.  HENT:     Head: Normocephalic.     Right Ear: Tympanic membrane and external ear normal. There is no impacted cerumen.     Left Ear: Tympanic membrane and external ear normal. There is no impacted cerumen.     Nose: Nose normal. No congestion or rhinorrhea.     Mouth/Throat:     Pharynx: No oropharyngeal exudate or posterior oropharyngeal erythema.  Eyes:     General: No scleral icterus.       Right eye: No discharge.        Left eye: No discharge.     Conjunctiva/sclera: Conjunctivae normal.     Pupils: Pupils are equal, round, and reactive to light.  Neck:     Thyroid: No thyromegaly.     Vascular: No JVD.     Trachea: No tracheal deviation.  Cardiovascular:     Rate and Rhythm: Normal rate and regular rhythm.     Heart sounds: Normal heart sounds. No murmur heard.    No friction rub. No gallop.  Pulmonary:     Effort: No respiratory distress.     Breath sounds: Normal breath sounds. No wheezing, rhonchi or rales.  Abdominal:     General: Bowel sounds are normal.     Palpations: Abdomen is soft. There is no mass.     Tenderness: There is no abdominal tenderness. There is no guarding or rebound.  Musculoskeletal:        General: No tenderness. Normal  range of motion.     Cervical back: Normal range of motion and neck supple.  Lymphadenopathy:     Cervical: No cervical adenopathy.  Skin:    General: Skin is  warm.     Coloration: Skin is not jaundiced or pale.     Findings: No bruising, erythema or rash.  Neurological:     Mental Status: He is alert and oriented to person, place, and time.     Cranial Nerves: No cranial nerve deficit.     Sensory: No sensory deficit.     Motor: No weakness.     Coordination: Coordination normal.     Deep Tendon Reflexes: Reflexes are normal and symmetric.     Wt Readings from Last 3 Encounters:  12/30/21 145 lb (65.8 kg)  12/30/21 144 lb (65.3 kg)  12/26/21 149 lb (67.6 kg)    BP 120/60   Pulse (!) 57   Ht 5' 10"  (1.778 m)   Wt 145 lb (65.8 kg)   SpO2 99%   BMI 20.81 kg/m   Assessment and Plan:  Patient returns for reassessment from evaluation on 12/19/2021.  Therefore aspects as noted below which may have contributed to this which needed to be reevaluated after this time and patient has since seen cardiology, pulmonary and probably at some point in time it will be involved that patient will see gastroenterology.   1. Centrilobular emphysema ([place]) Patient has recently been rechecked by pulmonary for centrilobular emphysema and severe persistent asthma.  He has been taken off Trelegy and Judithann Sauger and is now on Symbicort with the addition of an anticholinergic nebulizer.  Currently patient is stable with breathing as far as his COPD as well as his persistent asthma is under control with current regimen as above.  2. Severe persistent asthma without complication See above.  3. Paroxysmal atrial fibrillation (HCC) Patient was evaluated for atypical chest pain and shortness of breath with concerns that the flecainide that he was placed on may be causing some issues but this is not the case and that his paroxysmal atrial fibrillation is doing well on current dosing and there is no perceived issues with cardiac medications.  4. Gastroesophageal reflux disease, unspecified whether esophagitis present Patient originally was on a PPI but this has been  switched over to Pepcid/famotidine and is doing well with his reflux with this as that this was probably contributing to his pulmonary concerns.  At this point in time he is going to continue on the famotidine and if this is doing well we will maintain until there is breakthrough and that a PPI will need to be considered in the future.  Otilio Miu, MD

## 2021-12-30 NOTE — Progress Notes (Signed)
Subjective:    Patient ID: Antonio [name], male    DOB: 1945-11-30, 76 y.o.   MRN: [id number] Patient Care Team: [name], MD as PCP - General (Family Medicine) Vickie Epley, MD as PCP - Electrophysiology (Cardiology) Pyrtle, Lajuan Lines, MD as Consulting Physician (Gastroenterology) Tyler Pita, MD as Consulting Physician (Pulmonary Disease) Hollice Espy, MD as Consulting Physician (Urology)  Chief Complaint  Patient presents with   Follow-up    SOB with exertion. No wheezing or cough.    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  He was noted to have asthma with allergic phenotype and elevated IgE and peripheral eosinophilia. He was started on Dupixent on September 2020.  He has history significant for coronary artery disease and paroxysmal atrial fibrillation he is on Eliquis for anticoagulation.  He follows here after his visit of 18 November 2021 this is a scheduled visit.  He is currently maintained on Breztri 2 puffs twice a day and as needed albuterol.  He has not had to use his albuterol at all.  He also has nocturnal hypoxemia due to emphysema and is on nocturnal oxygen at 2 L/min and notes that this has had a very positive impact on his health and wellbeing.  He continues to have issues with shortness of breath with severe exertion however he can perform his activities of daily living and usual gardening chores etc. around the home without difficulty.  He was referred to pulmonary rehab at his last visit however he has had to postpone starting the therapy due to some issues he developed with urinary retention.  He developed the symptoms on 10 November and had to be seen in the emergency room and a Foley catheter placed.  He had been having some issues with constipation as well during that time.  He had received some codeine for a cough.  He has been on [place] he  does not endorse any fevers, chills or sweats.  He has not had any cough or sputum production.  No hemoptysis.  No weight loss or anorexia.  No orthopnea, paroxysmal nocturnal dyspnea lower extremity edema nor calf tenderness.  Overall he feels well and looks well.   DATA 06/09/2014 pulmonary function test> ratio 51%, FEV1 1.47 L (40% predicted), total lung capacity 5.43 L (71% predicted). DLCO 16.26 (44% predicted) December 2017 lung function testing showed ratio 50%, FEV1 1.47 L 43% predicted, FVC 2.96 L 67% predicted March 2020 pulmonary function testing ratio 38%, FEV1 0.97 L 31% predicted, total lung capacity 8.44 L 119% predicted, residual volume 198% predicted, DLCO 14.0 55% predicted March 2020 high-resolution CT scan of the chest images : Centrilobular emphysema, no interstitial lung disease, aortic atherosclerosis 04/20/2018-eosinophils relative 7.3, eosinophils absolute 0.6 04/20/2018-IgE-67 01/20/2019-SARS-CoV-2-detected 03/06/2021 alpha 1 antitrypsin: Phenotype MM, level 165 03/11/2021 echocardiogram: LVEF 55%, right ventricular function normal.  No wall motion abnormalities. 04/03/2021 PFT: FEV1 1.09 L or 36% predicted, FVC 2.60 L or 61% predicted, FEV1/FVC 42%, no bronchodilator response. Lung volumes showed moderate air trapping.  Diffusion capacity severely decreased at 49%.  Review of Systems A 10 point review of systems was performed and it is as noted above otherwise negative.  Patient Active Problem List   Diagnosis Date Noted   Atypical chest pain 12/21/2021   DOE (dyspnea on exertion) 01/24/2021   Secondary hypercoagulable state ([place]) 12/19/2020   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  PAD (peripheral artery disease) ([place]) 11/08/2019   Paroxysmal atrial fibrillation ([place]) 09/02/2019   Stiffness of finger joint of left hand 04/06/2019   History of COVID-19 01/20/2019   Severe persistent asthma without complication 89/21/1943   Medication management 09/20/2018   Chronic  obstructive pulmonary disease ([place]) 09/20/2018   Increased eosinophils in the blood 06/21/2018   Diffusion capacity of lung (dl), decreased 03/11/2018   COPD with acute exacerbation ([place]) 06/22/2017   Atrial flutter, paroxysmal ([place]) 10/17/2016   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/2016   PSVT (paroxysmal supraventricular tachycardia) 10/17/2016   Ulcerative colitis with complication ([place]) 74/09/1446   Cervical stenosis of spinal canal 05/05/2016   Carotid stenosis 05/02/2016   Atherosclerosis of native arteries of extremity with intermittent claudication ([place]) 05/02/2016   Cramping of hands 01/10/2016   Bilateral carpal tunnel syndrome 12/06/2015   Diarrhea 06/09/2014   Centrilobular Emphysema 04/25/2014   Former smoker 04/25/2014   Tobacco abuse 04/25/2014   Social History   Tobacco Use   Smoking status: Former    Packs/day: 0.50    Years: 35.00    Total pack years: 17.50    Types: Cigarettes, Cigars    Quit date: 06/11/2011    Years since quitting: 10.5   Smokeless tobacco: Never   Tobacco comments:    Former smoker 12/19/2020  Substance Use Topics   Alcohol use: Yes    Alcohol/week: 6.0 standard drinks of alcohol    Types: 6 Shots of liquor per week    Comment: wine, liquor, or beer   Allergies  Allergen Reactions   Penicillins Rash    Has patient had a PCN reaction causing immediate rash, facial/tongue/throat swelling, SOB or lightheadedness with hypotension:Yes Has patient had a PCN reaction causing severe rash involving mucus membranes or skin necrosis:Yes Has patient had a PCN reaction that required hospitalization:No Has patient had a PCN reaction occurring within the last 10 years:No If all of the above answers are "NO", then may proceed with Cephalosporin use.    Current Meds  Medication Sig   albuterol (PROVENTIL) (2.5 MG/3ML) 0.083% nebulizer solution Take 3 mLs (2.5 mg total) by nebulization every 6 (six) hours as needed for wheezing or shortness of breath.   albuterol  (VENTOLIN HFA) 108 (90 Base) MCG/ACT inhaler Inhale 2 puffs into the lungs every 6 (six) hours as needed for wheezing or shortness of breath.   azelastine (ASTELIN) 0.1 % nasal spray Place 1 spray into both nostrils 2 (two) times daily. Use in each nostril as directed   doxycycline (VIBRA-TABS) 100 MG tablet Take 1 tablet (100 mg total) by mouth 2 (two) times daily.   dupilumab (DUPIXENT) 300 MG/2ML prefilled syringe Inject 300 mg into the skin every 14 (fourteen) days.   famotidine (PEPCID) 20 MG tablet Take 1 tablet (20 mg total) by mouth 2 (two) times daily as needed for heartburn or indigestion.   flecainide (TAMBOCOR) 50 MG tablet Take 1.5 tablets (75 mg total) by mouth 2 (two) times daily.   guaiFENesin-codeine (ROBITUSSIN AC) 100-10 MG/5ML syrup Take 5 mLs by mouth 3 (three) times daily as needed for cough.   hydrocortisone 2.5 % cream Apply 1 application. topically as needed.   ipratropium (ATROVENT) 0.02 % nebulizer solution Take 2.5 mLs (0.5 mg total) by nebulization 4 (four) times daily.   mesalamine (LIALDA) 1.2 g EC tablet TAKE 4 TABLETS BY MOUTH DAILY WITH BREAKFAST   ondansetron (ZOFRAN-ODT) 4 MG disintegrating tablet Take 1 tablet (4 mg total) by mouth every 8 (eight) hours  as needed for nausea or vomiting.   rivaroxaban (XARELTO) 20 MG TABS tablet Take 1 tablet (20 mg total) by mouth daily with supper.   rosuvastatin (CRESTOR) 10 MG tablet Take 1 tablet (10 mg total) by mouth daily.   tamsulosin (FLOMAX) 0.4 MG CAPS capsule Take 1 capsule (0.4 mg total) by mouth daily.   Budeson-Glycopyrrol-Formoterol (BREZTRI AEROSPHERE) 160-9-4.8 MCG/ACT AERO Inhale 2 puffs into the lungs in the morning and at bedtime.      Objective:   Physical Exam BP 110/70 (BP Location: Left Arm, Cuff Size: Normal)   Pulse 75   Temp 97.8 F (36.6 C)   Ht 5' 10"  (1.778 m)   Wt 144 lb (65.3 kg)   SpO2 96%   BMI 20.66 kg/m  GENERAL: Thin well-developed gentleman, no acute distress.  Well-groomed, fully  ambulatory.  No conversational dyspnea. HEAD: Normocephalic, atraumatic.  EYES: Pupils equal, round, reactive to light.  No scleral icterus.  MOUTH: Dentition intact.  Oral mucosa moist.  No thrush. NECK: Supple. No thyromegaly. Trachea midline. No JVD.  No adenopathy. PULMONARY: Good air entry bilaterally.  No adventitious sounds. CARDIOVASCULAR: S1 and S2.  No rubs, murmurs or gallops heard. ABDOMEN: Benign. MUSCULOSKELETAL: No joint deformity, no clubbing, no edema.  NEUROLOGIC: No overt focal deficit, no gait disturbance, speech is fluent. SKIN: Intact,warm,dry. PSYCH: Mood and behavior normal.     Assessment & Plan:     ICD-10-CM   1. Severe persistent asthma without complication  N82.95    We will switch Breztri to Symbicort 160/4.5, 2 inhalations twice a day Continue Dupixent injections    2. Stage 3 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 ([place])  J44.9    Has been on Breztri Given recent urinary retention issues we will switch to Symbicort Continue as needed albuterol    3. Nocturnal hypoxemia due to emphysema [facility])  J43.9    G47.36    Patient is compliant with oxygen Patient notes benefit from therapy Continue nocturnal oxygen 2 L/min    4. Urinary retention  R33.9    Currently on Flomax Foley catheter discontinued 11/16 Discontinue Breztri due to glycopyrrolate component Patient has been switched to Symbicort     Meds ordered this encounter  Medications   SYMBICORT 160-4.5 MCG/ACT inhaler    Sig: Inhale 2 puffs into the lungs 2 (two) times daily.    Dispense:  10.2 g    Refill:  12   Patient has been switched to Symbicort which will remove the glycopyrrolate component from his current regimen.  This is on the off chance that this may be causing the patient's issues with urinary retention.  The patient is to let us know should the switch cause any increase in respiratory symptoms.  We will see the patient in follow-up in 3 months time he is to call sooner should any  new problems arise.  Renold Don, MD Advanced Bronchoscopy P[facility]M [facility]    *This note was dictated using voice recognition software/Dragon.  Despite best efforts to proofread, errors can occur which can change the meaning. Any transcriptional errors that result from this process are unintentional and may not be fully corrected at the time of dictation.

## 2021-12-30 NOTE — Patient Instructions (Addendum)
Your lungs sounded really clear today.  We have switched your Breztri to Symbicort 160/4.5 on the off chance that the extra component and Judithann Sauger may be causing some of your urinary retention.  Let us know if you have any difficulties with the change.  We will see him in follow-up in 3 months time call sooner should any new problems arise.

## 2021-12-30 NOTE — Telephone Encounter (Signed)
Just an FYI - Still having urinating problems,urinates in the shower. Going 2 a day. Pt will see you in a few weeks.  Did not want a appt today.

## 2022-01-07 ENCOUNTER — Encounter: Payer: Self-pay | Admitting: Internal Medicine

## 2022-01-07 ENCOUNTER — Telehealth: Payer: Self-pay | Admitting: Internal Medicine

## 2022-01-07 NOTE — Telephone Encounter (Signed)
Pt called to report bilateral ankle swelling x 1 day. He denies significant weight increase or changes in diet. He reports SOB but stated it's within his norm. Pt also reported last week his was admitted for urinary rentention and required a catheter for a week, however, he stated he has since being urinating ok. Pt questioning if MD feels he needs lasix.  Will forward to MD for review.

## 2022-01-07 NOTE — Telephone Encounter (Signed)
Pt c/o swelling: STAT is pt has developed SOB within 24 hours  How much weight have you gained and in what time span? Not sure   If swelling, where is the swelling located? In ankles  Are you currently taking a fluid pill? No  Are you currently SOB? COPD, so yes.   Do you have a log of your daily weights (if so, list)? No   Have you gained 3 pounds in a day or 5 pounds in a week? No  Have you traveled recently? No    Pt states that he was told by Dr. Saunders Revel at last appt that pt may need fluid pill and he would like a call back to discuss this.

## 2022-01-08 MED ORDER — FUROSEMIDE 20 MG PO TABS: 20.0000 mg | ORAL_TABLET | Freq: Every day | ORAL | 3 refills | Status: DC | PRN

## 2022-01-08 NOTE — Telephone Encounter (Signed)
I think it is reasonable to prescribe furosemide 20 mg daily as needed for edema/weight gain.  Mr. Ganaway should be mindful of his sodium intake, as this can exacerbate fluid retention as well.  Nelva Bush, MD [facility]

## 2022-01-08 NOTE — Telephone Encounter (Signed)
Pt updated with MD's recommendations and verbalized understanding. New Rx for Lasix 20 mg daily PRN sent to requested pharmacy.

## 2022-01-15 ENCOUNTER — Ambulatory Visit: Payer: PPO | Admitting: Internal Medicine

## 2022-01-17 ENCOUNTER — Other Ambulatory Visit: Payer: Self-pay | Admitting: Physician Assistant

## 2022-01-17 DIAGNOSIS — R339 Retention of urine, unspecified: Secondary | ICD-10-CM

## 2022-01-23 ENCOUNTER — Ambulatory Visit: Payer: PPO | Admitting: Physician Assistant

## 2022-01-23 ENCOUNTER — Encounter: Payer: Self-pay | Admitting: Physician Assistant

## 2022-01-23 ENCOUNTER — Telehealth: Payer: Self-pay | Admitting: Pulmonary Disease

## 2022-01-23 VITALS — BP 138/65 | HR 85 | Ht 70.0 in | Wt 144.0 lb

## 2022-01-23 DIAGNOSIS — N400 Enlarged prostate without lower urinary tract symptoms: Secondary | ICD-10-CM

## 2022-01-23 DIAGNOSIS — R339 Retention of urine, unspecified: Secondary | ICD-10-CM

## 2022-01-23 LAB — BLADDER SCAN AMB NON-IMAGING: Scan Result: 0

## 2022-01-23 MED ORDER — TAMSULOSIN HCL 0.4 MG PO CAPS: 0.4000 mg | ORAL_CAPSULE | Freq: Every day | ORAL | 3 refills | Status: DC

## 2022-01-23 MED ORDER — ALBUTEROL SULFATE HFA 108 (90 BASE) MCG/ACT IN AERS: 2.0000 | INHALATION_SPRAY | Freq: Four times a day (QID) | RESPIRATORY_TRACT | 2 refills | Status: DC | PRN

## 2022-01-23 NOTE — Progress Notes (Signed)
01/23/2022 11:10 AM   [place] Oct 23, 1945 [id number]  CC: Chief Complaint  Patient presents with   Follow-up   HPI: Antonio [name] is a 76 y.o. male with a recent history of urinary retention in the setting of constipation and narcotic cough syrup who passed a voiding trial with [place] last month who presents today for 1 month follow-up on Flomax.   Today he reports he is tolerating Flomax without orthostasis.  He feels his urination is generally better on Flomax, but is unable to specify any particular improvements.  He wishes to continue the Flomax.  IPSS 0/pleased as below.  PVR 0 mL.   IPSS     Row Name 01/23/22 1000         International Prostate Symptom Score   How often have you had the sensation of not emptying your bladder? Not at All     How often have you had to urinate less than every two hours? Not at All     How often have you found you stopped and started again several times when you urinated? Not at All     How often have you found it difficult to postpone urination? Not at All     How often have you had a weak urinary stream? Not at All     How often have you had to strain to start urination? Not at All     How many times did you typically get up at night to urinate? None     Total IPSS Score 0       Quality of Life due to urinary symptoms   If you were to spend the rest of your life with your urinary condition just the way it is now how would you feel about that? Pleased               PMH: Past Medical History:  Diagnosis Date   Allergy    Calf pain    COPD (chronic obstructive pulmonary disease) (HCC)    no Oxygen per pt   COVID-19    Dyspnea    Hypokalemia    Internal hemorrhoids    Internal hemorrhoids    Muscle cramp    Muscle pain    PAD (peripheral artery disease) (HCC)    Paroxysmal atrial fibrillation ([place]) 08/2019   Pneumonia    PONV (postoperative nausea and vomiting)    Tubular adenoma of colon    Ulcerative  colitis ([place])     Surgical History: Past Surgical History:  Procedure Laterality Date   ANTERIOR CERVICAL DECOMP/DISCECTOMY FUSION N/A 05/05/2016   Procedure: ANTERIOR CERVICAL DECOMPRESSION FUSION CERVICAL THREE-FOUR.;  Surgeon: Earnie Larsson, MD;  Location: [facility];  Service: Neurosurgery;  Laterality: N/A;  right side approach   CATARACT EXTRACTION Bilateral    CERVICAL DISCECTOMY  04/2016   C3-C4   COLONOSCOPY     COLONOSCOPY  08/09/2015   HEMORRHOID SURGERY  1988   leg stent Left approx 5-6 yrs ago   2 to left leg   MENISCUS REPAIR Left 12/2013   MOHS SURGERY     POLYPECTOMY     ROTATOR CUFF REPAIR Left 2007   TONSILLECTOMY AND ADENOIDECTOMY  1957    Home Medications:  Allergies as of 01/23/2022       Reactions   Penicillins Rash   Has patient had a PCN reaction causing immediate rash, facial/tongue/throat swelling, SOB or lightheadedness with hypotension:Yes Has patient had a PCN reaction causing severe  rash involving mucus membranes or skin necrosis:Yes Has patient had a PCN reaction that required hospitalization:No Has patient had a PCN reaction occurring within the last 10 years:No If all of the above answers are "NO", then may proceed with Cephalosporin use.        Medication List        Accurate as of January 23, 2022 11:10 AM. If you have any questions, ask your nurse or doctor.          albuterol 108 (90 Base) MCG/ACT inhaler Commonly known as: VENTOLIN HFA Inhale 2 puffs into the lungs every 6 (six) hours as needed for wheezing or shortness of breath.   albuterol (2.5 MG/3ML) 0.083% nebulizer solution Commonly known as: PROVENTIL Take 3 mLs (2.5 mg total) by nebulization every 6 (six) hours as needed for wheezing or shortness of breath.   azelastine 0.1 % nasal spray Commonly known as: ASTELIN Place 1 spray into both nostrils 2 (two) times daily. Use in each nostril as directed   Dupixent 300 MG/2ML prefilled syringe Generic drug:  dupilumab Inject 300 mg into the skin every 14 (fourteen) days.   famotidine 20 MG tablet Commonly known as: PEPCID Take 1 tablet (20 mg total) by mouth 2 (two) times daily as needed for heartburn or indigestion.   flecainide 50 MG tablet Commonly known as: TAMBOCOR Take 1.5 tablets (75 mg total) by mouth 2 (two) times daily.   furosemide 20 MG tablet Commonly known as: LASIX Take 1 tablet (20 mg total) by mouth daily as needed (for swelling or weight gain of 3 lbs overnight or 5 lbs in a week).   hydrocortisone 2.5 % cream Apply 1 application. topically as needed.   ipratropium 0.02 % nebulizer solution Commonly known as: ATROVENT Take 2.5 mLs (0.5 mg total) by nebulization 4 (four) times daily.   mesalamine 1.2 g EC tablet Commonly known as: LIALDA TAKE 4 TABLETS BY MOUTH DAILY WITH BREAKFAST   metoprolol succinate 25 MG 24 hr tablet Commonly known as: TOPROL-XL TAKE 1 TABLET (25 MG TOTAL) BY MOUTH DAILY.   ondansetron 4 MG disintegrating tablet Commonly known as: ZOFRAN-ODT Take 1 tablet (4 mg total) by mouth every 8 (eight) hours as needed for nausea or vomiting.   rivaroxaban 20 MG Tabs tablet Commonly known as: XARELTO Take 1 tablet (20 mg total) by mouth daily with supper.   rosuvastatin 10 MG tablet Commonly known as: CRESTOR Take 1 tablet (10 mg total) by mouth daily.   Symbicort 160-4.5 MCG/ACT inhaler Generic drug: budesonide-formoterol Inhale 2 puffs into the lungs 2 (two) times daily.   tamsulosin 0.4 MG Caps capsule Commonly known as: FLOMAX Take 1 capsule (0.4 mg total) by mouth daily.        Allergies:  Allergies  Allergen Reactions   Penicillins Rash    Has patient had a PCN reaction causing immediate rash, facial/tongue/throat swelling, SOB or lightheadedness with hypotension:Yes Has patient had a PCN reaction causing severe rash involving mucus membranes or skin necrosis:Yes Has patient had a PCN reaction that required  hospitalization:No Has patient had a PCN reaction occurring within the last 10 years:No If all of the above answers are "NO", then may proceed with Cephalosporin use.     Family History: Family History  Problem Relation Age of Onset   Cancer Mother        leukemia   Colon polyps Mother    Heart disease Father    Heart attack Father 23   Healthy Daughter  Healthy Son    Colon cancer Neg Hx    Esophageal cancer Neg Hx    Rectal cancer Neg Hx    Stomach cancer Neg Hx     Social History:   reports that he quit smoking about 10 years ago. His smoking use included cigarettes and cigars. He has a 17.50 pack-year smoking history. He has never used smokeless tobacco. He reports current alcohol use of about 6.0 standard drinks of alcohol per week. He reports that he does not use drugs.  Physical Exam: BP 138/65   Pulse 85   Ht 5' 10"  (1.778 m)   Wt 144 lb (65.3 kg)   BMI 20.66 kg/m   Constitutional:  Alert and oriented, no acute distress, nontoxic appearing HEENT: [place], AT Cardiovascular: No clubbing, cyanosis, or edema Respiratory: Normal respiratory effort, no increased work of breathing GU: Normal sphincter tone.  Smooth, symmetrically enlarged 40+ cc prostate without nodules or induration. Skin: No rashes, bruises or suspicious lesions Neurologic: Grossly intact, no focal deficits, moving all 4 extremities Psychiatric: Normal mood and affect  Laboratory Data: Results for orders placed or performed in visit on 01/23/22  Bladder Scan (Post Void Residual) in office  Result Value Ref Range   Scan Result 0    Assessment & Plan:   1. Urinary retention He continues to empty Foley on Flomax.  No further intervention indicated. - Bladder Scan (Post Void Residual) in office  2. Benign prostatic hyperplasia without lower urinary tract symptoms He is doing well on Flomax and tolerating it without orthostasis.  Will plan to continue this.  Benign DRE today, we discussed that no  further prostate cancer screening is indicated given his age. - tamsulosin (FLOMAX) 0.4 MG CAPS capsule; Take 1 capsule (0.4 mg total) by mouth daily.  Dispense: 90 capsule; Refill: 3   Return in about 1 year (around 01/24/2023) for Annual BPH f/u with IPSS, PVR.  Debroah Loop, PA-C  [facility] [address] [place], [place] [address] [phone]

## 2022-01-23 NOTE — Telephone Encounter (Signed)
I have sent in the patient's medication and left a message for him to return my call.

## 2022-01-23 NOTE — Telephone Encounter (Signed)
I notified the patient. Nothing further is needed.

## 2022-01-27 ENCOUNTER — Telehealth: Payer: Self-pay

## 2022-01-27 NOTE — Telephone Encounter (Signed)
Contacted pt and discussed PAP re-enrollment for calendar year 2024.  Pt states he has received the renewal form and has sent it back in to [facility]. Will await approval letter.

## 2022-02-18 ENCOUNTER — Telehealth: Payer: Self-pay | Admitting: Internal Medicine

## 2022-02-18 NOTE — Telephone Encounter (Signed)
Patient c/o Palpitations:  High priority if patient c/o lightheadedness, shortness of breath, or chest pain  How long have you had palpitations/irregular HR/ Afib? Are you having the symptoms now?  Patient's wife states the patient had an episode of afib last night before midnight.  No symptoms currently  Are you currently experiencing lightheadedness, SOB or CP?  No   Do you have a history of afib (atrial fibrillation) or irregular heart rhythm?  Yes   Have you checked your BP or HR? (document readings if available):  HR got up to 102 last night, took an extra metoprolol and got it down to 97 then it went back up to 100 shortly after This morning HR ranges 68-70 69 currently  Are you experiencing any other symptoms?  Nausea

## 2022-02-18 NOTE — Telephone Encounter (Signed)
Spoke to pt.He report yesterday he developed an episode of afib that lasted roughly 3 hours. Pt stated HR ranged from 92-105. Pt stated he took an extra metoprolol and HR decreased thereafter. Pt denied lightheadedness, SOB, or chest. He reported he just felt like his heart was racing. HR currently 65-70.  Appointment scheduled for tomorrow 1/10 for further evaluations.

## 2022-02-19 ENCOUNTER — Encounter: Payer: Self-pay | Admitting: Internal Medicine

## 2022-02-19 ENCOUNTER — Other Ambulatory Visit
Admission: RE | Admit: 2022-02-19 | Discharge: 2022-02-19 | Disposition: A | Discharge status: Home / Self Care | Payer: PPO | Source: Ambulatory Visit | Attending: Internal Medicine | Admitting: Internal Medicine

## 2022-02-19 ENCOUNTER — Ambulatory Visit: Payer: PPO | Attending: Internal Medicine | Admitting: Internal Medicine

## 2022-02-19 VITALS — BP 122/74 | HR 64 | Ht 70.0 in | Wt 143.4 lb

## 2022-02-19 DIAGNOSIS — I739 Peripheral vascular disease, unspecified: Secondary | ICD-10-CM

## 2022-02-19 DIAGNOSIS — I480 Paroxysmal atrial fibrillation: Secondary | ICD-10-CM | POA: Diagnosis not present

## 2022-02-19 DIAGNOSIS — Z01812 Encounter for preprocedural laboratory examination: Secondary | ICD-10-CM | POA: Insufficient documentation

## 2022-02-19 DIAGNOSIS — I70212 Atherosclerosis of native arteries of extremities with intermittent claudication, left leg: Secondary | ICD-10-CM | POA: Diagnosis not present

## 2022-02-19 DIAGNOSIS — J449 Chronic obstructive pulmonary disease, unspecified: Secondary | ICD-10-CM | POA: Diagnosis not present

## 2022-02-19 DIAGNOSIS — R072 Precordial pain: Secondary | ICD-10-CM

## 2022-02-19 DIAGNOSIS — I471 Supraventricular tachycardia, unspecified: Secondary | ICD-10-CM

## 2022-02-19 LAB — BASIC METABOLIC PANEL
Anion gap: 7 (ref 5–15)
BUN: 12 mg/dL (ref 8–23)
CO2: 28 mmol/L (ref 22–32)
Calcium: 9.2 mg/dL (ref 8.9–10.3)
Chloride: 103 mmol/L (ref 98–111)
Creatinine, Ser: 1.03 mg/dL (ref 0.61–1.24)
GFR, Estimated: >60 mL/min (ref >60)
Glucose, Bld: 108 mg/dL — ABNORMAL HIGH (ref 70–99)
Potassium: 4.2 mmol/L (ref 3.5–5.1)
Sodium: 138 mmol/L (ref 135–145)

## 2022-02-19 LAB — LIPID PANEL
Cholesterol: 127 mg/dL (ref 0–200)
HDL: 55 mg/dL (ref >40)
LDL Cholesterol: 57 mg/dL (ref 0–99)
Total CHOL/HDL Ratio: 2.3 RATIO
Triglycerides: 73 mg/dL (ref <150)
VLDL: 15 mg/dL (ref 0–40)

## 2022-02-19 LAB — ALT: ALT: 29 U/L (ref 0–44)

## 2022-02-19 MED ORDER — METOPROLOL TARTRATE 25 MG PO TABS: ORAL_TABLET | ORAL | 0 refills | Status: DC

## 2022-02-19 MED ORDER — METOPROLOL SUCCINATE ER 25 MG PO TB24: 25.0000 mg | ORAL_TABLET | Freq: Every day | ORAL | 3 refills | Status: DC

## 2022-02-19 NOTE — Patient Instructions (Addendum)
Medication Instructions:  Your Physician recommend you continue on your current medication as directed.    Dr. Saunders Revel recommends taking your Pepcid 20 mg by mouth twice a day for 1-2 weeks   Lab Work: Your provider would like for you to have following labs drawn: (BMP).   Please go to the [facility] entrance and check in at the front desk.  You do not need an appointment.  They are open from 7am-6 pm.   If you have labs (blood work) drawn today and your tests are completely normal, you will receive your results only by: [place] (if you have MyChart) OR A paper copy in the mail If you have any lab test that is abnormal or we need to change your treatment, we will call you to review the results.   Testing/Procedures: Cardiac CT Angiography (CTA), is a special type of CT scan that uses a computer to produce multi-dimensional views of major blood vessels throughout the body. In CT angiography, a contrast material is injected through an IV to help visualize the blood vessels    Follow-Up: At [facility], you and your health needs are our priority.  As part of our continuing mission to provide you with exceptional heart care, we have created designated Provider Care Teams.  These Care Teams include your primary Cardiologist (physician) and Advanced Practice Providers (APPs -  Physician Assistants and Nurse Practitioners) who all work together to provide you with the care you need, when you need it.  We recommend signing up for the patient portal called "MyChart".  Sign up information is provided on this After Visit Summary.  MyChart is used to connect with patients for Virtual Visits (Telemedicine).  Patients are able to view lab/test results, encounter notes, upcoming appointments, etc.  Non-urgent messages can be sent to your provider as well.   To learn more about what you can do with MyChart, go to [email or web address].    Your next appointment:   1 month(s)  The  format for your next appointment:   In Person  Provider:   You may see Nelva Bush, MD or one of the following Advanced Practice Providers on your designated Care Team:   Murray Hodgkins, NP Christell Faith, PA-C Cadence Kathlen Mody, PA-C Gerrie Nordmann, NP      Your cardiac CT will be scheduled at one of the below locations:    [facility] [place], [place] [address] [phone]  [facility] [place], [place] [address] [phone]  If scheduled at [facility], please arrive at the [facility] and Children's Entrance (Entrance C2) of [facility] 30 minutes prior to test start time. You can use the FREE valet parking offered at entrance C (encouraged to control the heart rate for the test)  Proceed to the [facility] Radiology Department (first floor) to check-in and test prep.  All radiology patients and guests should use entrance C2 at [facility], accessed from [facility], even though the hospital's physical address listed is [address]    If scheduled at [facility] or [facility], please arrive 15 mins early for check-in and test prep.   Please follow these instructions carefully (unless otherwise directed):  Hold all erectile dysfunction medications at least 3 days (72 hrs) prior to test. (Ie viagra, cialis, sildenafil, tadalafil, etc) We will administer nitroglycerin during this  exam.   On the Night Before the Test: Be sure to Drink plenty of water. Do not consume any caffeinated/decaffeinated beverages or chocolate 12 hours prior to your test. Do not take any antihistamines 12 hours prior to your test.   On the Day of the Test: Drink plenty of water until 1 hour prior to the test. Do not eat any food 1 hour prior to test. You may take your regular medications prior to the  test.  Take metoprolol (Lopressor) 25 mg in addition to metoprolol (succinate) 25 mg two hours prior to test. HOLD Furosemide morning of the test. FEMALES- please wear underwire-free bra if available, avoid dresses & tight clothing       After the Test: Drink plenty of water. After receiving IV contrast, you may experience a mild flushed feeling. This is normal. On occasion, you may experience a mild rash up to 24 hours after the test. This is not dangerous. If this occurs, you can take Benadryl 25 mg and increase your fluid intake. If you experience trouble breathing, this can be serious. If it is severe call 911 IMMEDIATELY. If it is mild, please call our office. If you take any of these medications: Glipizide/Metformin, Avandament, Glucavance, please do not take 48 hours after completing test unless otherwise instructed.  We will call to schedule your test 2-4 weeks out understanding that some insurance companies will need an authorization prior to the service being performed.   For non-scheduling related questions, please contact the cardiac imaging nurse navigator should you have any questions/concerns: [name], Cardiac Imaging Nurse Navigator Gordy Clement, Cardiac Imaging Nurse Navigator [facility] Direct Office Dial: [phone]   For scheduling needs, including cancellations and rescheduling, please call [place], [phone].

## 2022-02-19 NOTE — Progress Notes (Signed)
Follow-up Outpatient Visit Date: 02/19/2022  Primary Care Provider: [name], MD [address] [place] [address]  Chief Complaint: Chest pain and atrial fibrillation  HPI:  Antonio [name] is a 77 y.o. male with history of atrial fibrillation/flutter, SVT, hypertension, hyperlipidemia, ulcerative colitis, and asthma/COPD , who presents for follow-up of chest pain and atrial fibrillation.  I last saw him in early November for follow-up of chest pain.  He had not had any further episodes at that time, believing that his prior episode of chest pain was brought on by eating a [employer] sandwich complicated by a URI.  Pharmacologic MPI earlier in the year had been normal.  We agreed to add famotidine 20 mg twice daily for possible GERD.  We did not make any other medication changes or pursue further testing.  Today, Antonio [name] has a couple of concerns.  He believes that he had an episode of atrial fibrillation 2 nights ago.  He began feeling fluttering in his chest with elevated heart rates between 97 and 104 bpm that lasted from about 11 PM until 4 the next morning.  He reports having been compliant with metoprolol, flecainide, and rivaroxaban.  He has also been having fairly frequent indigestion/heartburn.  He has not used the famotidine that I previously recommended but instead is taking Tums without much relief.  Chronic exertional dyspnea is stable.  He notes having had some leg edema this past weekend for which she took his as needed furosemide twice.  Leg swelling has now resolved.  He has not felt lightheaded nor passed out.  His wife wonders if his chest discomfort and palpitations may have been related to increased stress stemming from their son being involved in a motor vehicle accident  --------------------------------------------------------------------------------------------------  Cardiovascular History & Procedures: Cardiovascular Problems: Atrial  fibrillation/flutter SVT PAD (followed by Dr. Lucky Cowboy)   Risk Factors: PAD, hyperlipidemia, male gender, age greater than 89, and prior tobacco use   Cath/PCI: None   CV Surgery: Left SFA and proximal popliteal artery stent (06/16/2012). Left SFA PTA and stent placement (10/20/2011).   EP Procedures and Devices: None   Non-Invasive Evaluation(s): Pharmacologic MPI (07/09/2021): Low risk study without ischemia or scar.  LVEF greater than 65%.  Coronary artery calcification and aortic atherosclerosis noted. TTE (03/11/2021): Normal LV size and wall thickness.  LVEF 55% with normal diastolic function.  Normal RV size and function.  Normal biatrial size.  No significant valvular abnormality.  Normal CVP. TTE (09/09/2019, [facility]): Technically difficult study.  Normal LV size and wall thickness.  LVEF 60-65% with normal diastolic function.  Normal RV size and function.  Normal biatrial size.  Mild mitral regurgitation.  Recent CV Pertinent Labs: Lab Results  Component Value Date   CHOL 175 12/17/2020   HDL 54 12/17/2020   LDLCALC 103 (H) 12/17/2020   TRIG 96 12/17/2020   CHOLHDL 2.6 12/13/2018   INR 1.3 (H) 12/13/2021   BNP 61.9 12/16/2021   K 4.7 12/20/2021   K 4.2 10/01/2013   MG 2.3 07/03/2021   BUN 17 12/20/2021   BUN 9 12/14/2019   BUN 11 10/01/2013   CREATININE 0.91 12/20/2021   CREATININE 0.93 10/01/2013    Past medical and surgical history were reviewed and updated in EPIC.  Current Meds  Medication Sig   albuterol (PROVENTIL) (2.5 MG/3ML) 0.083% nebulizer solution Take 3 mLs (2.5 mg total) by nebulization every 6 (six) hours as needed for wheezing or shortness of breath.   albuterol (  VENTOLIN HFA) 108 (90 Base) MCG/ACT inhaler Inhale 2 puffs into the lungs every 6 (six) hours as needed for wheezing or shortness of breath.   azelastine (ASTELIN) 0.1 % nasal spray Place 1 spray into both nostrils 2 (two) times daily. Use in each nostril as directed   dupilumab (DUPIXENT) 300  MG/2ML prefilled syringe Inject 300 mg into the skin every 14 (fourteen) days.   famotidine (PEPCID) 20 MG tablet Take 1 tablet (20 mg total) by mouth 2 (two) times daily as needed for heartburn or indigestion.   flecainide (TAMBOCOR) 50 MG tablet Take 1.5 tablets (75 mg total) by mouth 2 (two) times daily.   furosemide (LASIX) 20 MG tablet Take 1 tablet (20 mg total) by mouth daily as needed (for swelling or weight gain of 3 lbs overnight or 5 lbs in a week).   hydrocortisone 2.5 % cream Apply 1 application. topically as needed.   ipratropium (ATROVENT) 0.02 % nebulizer solution Take 2.5 mLs (0.5 mg total) by nebulization 4 (four) times daily.   mesalamine (LIALDA) 1.2 g EC tablet TAKE 4 TABLETS BY MOUTH DAILY WITH BREAKFAST   metoprolol succinate (TOPROL-XL) 25 MG 24 hr tablet TAKE 1 TABLET (25 MG TOTAL) BY MOUTH DAILY.   ondansetron (ZOFRAN-ODT) 4 MG disintegrating tablet Take 1 tablet (4 mg total) by mouth every 8 (eight) hours as needed for nausea or vomiting.   rivaroxaban (XARELTO) 20 MG TABS tablet Take 1 tablet (20 mg total) by mouth daily with supper.   rosuvastatin (CRESTOR) 10 MG tablet Take 1 tablet (10 mg total) by mouth daily.   SYMBICORT 160-4.5 MCG/ACT inhaler Inhale 2 puffs into the lungs 2 (two) times daily.   tamsulosin (FLOMAX) 0.4 MG CAPS capsule Take 1 capsule (0.4 mg total) by mouth daily.    Allergies: Penicillins  Social History   Tobacco Use   Smoking status: Former    Packs/day: 0.50    Years: 35.00    Total pack years: 17.50    Types: Cigarettes, Cigars    Quit date: 06/11/2011    Years since quitting: 10.7   Smokeless tobacco: Never   Tobacco comments:    Former smoker 12/19/2020  Vaping Use   Vaping Use: Former  Substance Use Topics   Alcohol use: Yes    Alcohol/week: 6.0 standard drinks of alcohol    Types: 6 Shots of liquor per week    Comment: wine, liquor, or beer   Drug use: No    Family History  Problem Relation Age of Onset   Cancer Mother         leukemia   Colon polyps Mother    Heart disease Father    Heart attack Father 40   Healthy Daughter    Healthy Son    Colon cancer Neg Hx    Esophageal cancer Neg Hx    Rectal cancer Neg Hx    Stomach cancer Neg Hx     Review of Systems: A 12-system review of systems was performed and was negative except as noted in the HPI.  --------------------------------------------------------------------------------------------------  Physical Exam: BP 122/74 (BP Location: Left Arm, Patient Position: Sitting, Cuff Size: Normal)   Pulse 64   Ht '5\' 10"'$  (1.778 m)   Wt 143 lb 6.4 oz (65 kg)   SpO2 95%   BMI 20.58 kg/m   General:  NAD. Neck: No JVD or HJR. Lungs: Mildly diminished breath sounds throughout without wheezes or crackles. Heart: Regular rate and rhythm without murmurs, rubs, or  gallops. Abdomen: Soft, nontender, nondistended. Extremities: No lower extremity edema.  EKG: Normal sinus rhythm without abnormalities.  Lab Results  Component Value Date   WBC 20.3 (H) 12/20/2021   HGB 13.8 12/20/2021   HCT 41.6 12/20/2021   MCV 92.7 12/20/2021   PLT 239 12/20/2021    Lab Results  Component Value Date   NA 138 12/20/2021   K 4.7 12/20/2021   CL 106 12/20/2021   CO2 25 12/20/2021   BUN 17 12/20/2021   CREATININE 0.91 12/20/2021   GLUCOSE 110 (H) 12/20/2021   ALT 17 12/13/2021    Lab Results  Component Value Date   CHOL 175 12/17/2020   HDL 54 12/17/2020   LDLCALC 103 (H) 12/17/2020   TRIG 96 12/17/2020   CHOLHDL 2.6 12/13/2018    --------------------------------------------------------------------------------------------------  ASSESSMENT AND PLAN: Chest pain: Mr. Schanz describes atypical chest pain most consistent with indigestion though it is not always related to eating.  MPI in May was low risk without ischemia or scar.  However, given continued symptoms and multiple risk factors, we discussed further evaluation with coronary CTA versus  catheterization.  We will proceed with coronary CTA first.  I have also encouraged him to take famotidine 20 mg twice daily for at least 1 to 2 weeks to see if this helps improve his symptoms.  Paroxysmal atrial fibrillation and SVT: Mr. Ferrari reports an episode of palpitations and slightly elevated heart rates lasting about 5 hours earlier this week.  His EKG today again shows sinus rhythm.  We discussed cardiac event monitor placement but have agreed to defer this for now.  Continue current regimen of metoprolol, flecainide, and rivaroxaban, with ongoing and per Dr. Quentin Ore.  COPD: This is stable.  Ongoing management per Drs. Patsey Berthold and Ronnald Ramp.  PAD: Continue secondary prevention with rosuvastatin and rivaroxaban and lieu of aspirin.  Patient may benefit from escalation of his statin therapy to target an LDL less than 70 (LDL 103 on the last check in 12/2020; we will plan to recheck lipid panel and ALT today).  Ongoing follow-up with vascular surgery.  Follow-up: Return to clinic in 1 month.  Nelva Bush, MD 02/19/2022 10:28 AM

## 2022-02-28 ENCOUNTER — Telehealth: Payer: Self-pay | Admitting: Internal Medicine

## 2022-02-28 NOTE — Telephone Encounter (Signed)
I spoke with the patient and advised him we have obtained the CPT code for his Cardiac Ct. The patient inquired if he would need to call his insurance to obtain an authorization.  I advised the patient that in the message I received back from the precert team with the CPT, they advised an authorization was not required per his insurance.  The patient advised that since no authorization was required, he did not need the CPT code, but he was very appreciative of the call back.

## 2022-02-28 NOTE — Telephone Encounter (Signed)
Antonio [name]  Sent: Fri February 28, 2022 11:30 AM  To: Emily Filbert, RN         Message  [phone] [name] is not required.    Thanks,    Estill Bamberg

## 2022-02-28 NOTE — Telephone Encounter (Signed)
To Precert to assist with CPT code for a Cardiac CTA.

## 2022-02-28 NOTE — Telephone Encounter (Signed)
Patient would like to get code for CT CORONARY MORPH W/CTA COR W/SCORE W/CA W/CM &/OR WO/CM test to provide to his insurance provider.  Can leave VM on mobile phone.

## 2022-03-04 ENCOUNTER — Telehealth (HOSPITAL_COMMUNITY): Payer: Self-pay | Admitting: Emergency Medicine

## 2022-03-04 NOTE — Telephone Encounter (Signed)
Reaching out to patient to offer assistance regarding upcoming cardiac imaging study; pt verbalizes understanding of appt date/time, parking situation and where to check in, pre-test NPO status and medications ordered, and verified current allergies; name and call back number provided for further questions should they arise Antonio Bond RN Navigator Cardiac Imaging [name] [phone] office [phone] cell   Arrival 1100 [facility] Daily meds , holding lasix  Denies iv issues Aware contrast

## 2022-03-05 ENCOUNTER — Ambulatory Visit (INDEPENDENT_AMBULATORY_CARE_PROVIDER_SITE_OTHER): Payer: PPO

## 2022-03-05 ENCOUNTER — Encounter (INDEPENDENT_AMBULATORY_CARE_PROVIDER_SITE_OTHER): Payer: Self-pay | Admitting: Nurse Practitioner

## 2022-03-05 ENCOUNTER — Ambulatory Visit (INDEPENDENT_AMBULATORY_CARE_PROVIDER_SITE_OTHER): Payer: PPO | Admitting: Nurse Practitioner

## 2022-03-05 VITALS — BP 112/55 | HR 59 | Ht 70.0 in | Wt 143.0 lb

## 2022-03-05 DIAGNOSIS — I6523 Occlusion and stenosis of bilateral carotid arteries: Secondary | ICD-10-CM

## 2022-03-05 DIAGNOSIS — J439 Emphysema, unspecified: Secondary | ICD-10-CM

## 2022-03-05 DIAGNOSIS — I70212 Atherosclerosis of native arteries of extremities with intermittent claudication, left leg: Secondary | ICD-10-CM | POA: Diagnosis not present

## 2022-03-06 ENCOUNTER — Ambulatory Visit
Admission: RE | Admit: 2022-03-06 | Discharge: 2022-03-06 | Disposition: A | Discharge status: Home / Self Care | Payer: PPO | Source: Ambulatory Visit | Attending: Internal Medicine | Admitting: Internal Medicine

## 2022-03-06 DIAGNOSIS — R072 Precordial pain: Secondary | ICD-10-CM

## 2022-03-06 MED ORDER — NITROGLYCERIN 0.4 MG SL SUBL
0.8000 mg | SUBLINGUAL_TABLET | Freq: Once | SUBLINGUAL | Status: AC
  Administered 2022-03-06: 0.8 mg via SUBLINGUAL

## 2022-03-06 MED ORDER — IOHEXOL 350 MG/ML SOLN
75.0000 mL | Freq: Once | INTRAVENOUS | Status: AC | PRN
  Administered 2022-03-06: 75 mL via INTRAVENOUS

## 2022-03-06 NOTE — Progress Notes (Signed)
Patient tolerated procedure well. Ambulate w/o difficulty. Denies light headedness or being dizzy. Sitting in chair drinking water provided. Encouraged to drink extra water today and reasoning explained. Verbalized understanding. All questions answered. ABC intact. No further needs. Discharge from procedure area w/o issues.   °

## 2022-03-10 LAB — VAS US ABI WITH/WO TBI
Left ABI: 0.62
Right ABI: 1.14

## 2022-03-12 NOTE — Telephone Encounter (Signed)
Emailed [place] for update on patient's Dupixent PAP renewal  Knox Saliva, PharmD, MPH, BCPS, CPP Clinical Pharmacist (Rheumatology and Pulmonology)

## 2022-03-14 NOTE — Telephone Encounter (Signed)
Per Dupixent FRM, patient has been reapproved for [place] [employer] patient assistance from 02/19/2022 through 02/10/2023. Called [employer] P[employer] and rep will refax approval letter to clinic.  Phone: [phone]  Knox Saliva, PharmD, MPH, BCPS, CPP Clinical Pharmacist (Rheumatology and Pulmonology)

## 2022-03-21 ENCOUNTER — Encounter: Payer: Self-pay | Admitting: Internal Medicine

## 2022-03-21 ENCOUNTER — Ambulatory Visit: Payer: PPO | Attending: Internal Medicine | Admitting: Internal Medicine

## 2022-03-21 VITALS — BP 124/62 | HR 53 | Ht 71.0 in | Wt 145.0 lb

## 2022-03-21 DIAGNOSIS — I480 Paroxysmal atrial fibrillation: Secondary | ICD-10-CM

## 2022-03-21 DIAGNOSIS — Z79899 Other long term (current) drug therapy: Secondary | ICD-10-CM

## 2022-03-21 DIAGNOSIS — I739 Peripheral vascular disease, unspecified: Secondary | ICD-10-CM | POA: Diagnosis not present

## 2022-03-21 DIAGNOSIS — I2510 Atherosclerotic heart disease of native coronary artery without angina pectoris: Secondary | ICD-10-CM | POA: Insufficient documentation

## 2022-03-21 DIAGNOSIS — J449 Chronic obstructive pulmonary disease, unspecified: Secondary | ICD-10-CM

## 2022-03-21 MED ORDER — METOPROLOL SUCCINATE ER 25 MG PO TB24: 12.5000 mg | ORAL_TABLET | Freq: Every day | ORAL | 3 refills | Status: DC

## 2022-03-21 NOTE — Progress Notes (Signed)
Follow-up Outpatient Visit Date: 03/21/2022  Primary Care Provider: [name], MD [address] [place] [address]  Chief Complaint: Follow-up coronary artery disease and atrial fibrillation  HPI:  [name] is a 77 y.o. male with history of atrial fibrillation/flutter, SVT, hypertension, hyperlipidemia, ulcerative colitis, and asthma/COPD, who presents for follow-up of chest pain and atrial fibrillation.  I last saw him a month ago, at which time he was concerned that he may have had an episode of atrial fibrillation 2 nights before her visit.  He also noted frequent indigestion/heartburn.  We agreed to perform a coronary CTA to ensure that this was not angina.  CTA showed moderate coronary artery calcification with nonobstructive disease.  Today, [name] reports that he feels fairly well.  He has not had any further palpitations.  He also denies chest pain and lightheadedness.  His breathing continues to fluctuate and is good some days and not so good on others.  Overall, this has been stable for years.  He has taken his as needed furosemide 3 times since we last saw each other.  --------------------------------------------------------------------------------------------------  Cardiovascular History & Procedures: Cardiovascular Problems: Atrial fibrillation/flutter SVT PAD (followed by Dr. Lucky Cowboy)   Risk Factors: PAD, hyperlipidemia, male gender, age greater than 70, and prior tobacco use   Cath/PCI: None   CV Surgery: Left SFA and proximal popliteal artery stent (06/16/2012). Left SFA PTA and stent placement (10/20/2011).   EP Procedures and Devices: None   Non-Invasive Evaluation(s): Coronary CTA (03/06/2022): Mild-moderate, nonobstructive coronary artery disease with 25% distal LMCA stenosis and 25-49% proximal LAD lesion.  No significant disease involving dominant LCx and nondominant RCA.  Coronary calcium score 251 (for 42nd percentile for age and sex  matched control. Pharmacologic MPI (07/09/2021): Low risk study without ischemia or scar.  LVEF greater than 65%.  Coronary artery calcification and aortic atherosclerosis noted. TTE (03/11/2021): Normal LV size and wall thickness.  LVEF 55% with normal diastolic function.  Normal RV size and function.  Normal biatrial size.  No significant valvular abnormality.  Normal CVP. TTE (09/09/2019, [facility]): Technically difficult study.  Normal LV size and wall thickness.  LVEF 60-65% with normal diastolic function.  Normal RV size and function.  Normal biatrial size.  Mild mitral regurgitation.  Recent CV Pertinent Labs: Lab Results  Component Value Date   CHOL 127 02/19/2022   CHOL 175 12/17/2020   HDL 55 02/19/2022   HDL 54 12/17/2020   LDLCALC 57 02/19/2022   LDLCALC 103 (H) 12/17/2020   TRIG 73 02/19/2022   CHOLHDL 2.3 02/19/2022   INR 1.3 (H) 12/13/2021   BNP 61.9 12/16/2021   K 4.2 02/19/2022   K 4.2 10/01/2013   MG 2.3 07/03/2021   BUN 12 02/19/2022   BUN 9 12/14/2019   BUN 11 10/01/2013   CREATININE 1.03 02/19/2022   CREATININE 0.93 10/01/2013    Past medical and surgical history were reviewed and updated in EPIC.  Current Meds  Medication Sig   albuterol (PROVENTIL) (2.5 MG/3ML) 0.083% nebulizer solution Take 3 mLs (2.5 mg total) by nebulization every 6 (six) hours as needed for wheezing or shortness of breath.   albuterol (VENTOLIN HFA) 108 (90 Base) MCG/ACT inhaler Inhale 2 puffs into the lungs every 6 (six) hours as needed for wheezing or shortness of breath.   azelastine (ASTELIN) 0.1 % nasal spray Place 1 spray into both nostrils 2 (two) times daily. Use in each nostril as directed   dupilumab (DUPIXENT) 300  MG/2ML prefilled syringe Inject 300 mg into the skin every 14 (fourteen) days.   famotidine (PEPCID) 20 MG tablet Take 1 tablet (20 mg total) by mouth 2 (two) times daily as needed for heartburn or indigestion.   flecainide (TAMBOCOR) 50 MG tablet Take 1.5 tablets (75 mg  total) by mouth 2 (two) times daily.   furosemide (LASIX) 20 MG tablet Take 1 tablet (20 mg total) by mouth daily as needed (for swelling or weight gain of 3 lbs overnight or 5 lbs in a week).   hydrocortisone 2.5 % cream Apply 1 application. topically as needed.   ipratropium (ATROVENT) 0.02 % nebulizer solution Take 2.5 mLs (0.5 mg total) by nebulization 4 (four) times daily.   mesalamine (LIALDA) 1.2 g EC tablet TAKE 4 TABLETS BY MOUTH DAILY WITH BREAKFAST   metoprolol succinate (TOPROL-XL) 25 MG 24 hr tablet Take 1 tablet (25 mg total) by mouth daily.   ondansetron (ZOFRAN-ODT) 4 MG disintegrating tablet Take 1 tablet (4 mg total) by mouth every 8 (eight) hours as needed for nausea or vomiting.   rivaroxaban (XARELTO) 20 MG TABS tablet Take 1 tablet (20 mg total) by mouth daily with supper.   rosuvastatin (CRESTOR) 10 MG tablet Take 1 tablet (10 mg total) by mouth daily.   SYMBICORT 160-4.5 MCG/ACT inhaler Inhale 2 puffs into the lungs 2 (two) times daily.   tamsulosin (FLOMAX) 0.4 MG CAPS capsule Take 1 capsule (0.4 mg total) by mouth daily.    Allergies: Penicillins  Social History   Tobacco Use   Smoking status: Former    Packs/day: 0.50    Years: 35.00    Total pack years: 17.50    Types: Cigarettes, Cigars    Quit date: 06/11/2011    Years since quitting: 10.7   Smokeless tobacco: Never   Tobacco comments:    Former smoker 12/19/2020  Vaping Use   Vaping Use: Former  Substance Use Topics   Alcohol use: Yes    Alcohol/week: 6.0 standard drinks of alcohol    Types: 6 Shots of liquor per week    Comment: wine, liquor, or beer   Drug use: No    Family History  Problem Relation Age of Onset   Cancer Mother        leukemia   Colon polyps Mother    Heart disease Father    Heart attack Father 25   Healthy Daughter    Healthy Son    Colon cancer Neg Hx    Esophageal cancer Neg Hx    Rectal cancer Neg Hx    Stomach cancer Neg Hx     Review of Systems: A 12-system  review of systems was performed and was negative except as noted in the HPI.  --------------------------------------------------------------------------------------------------  Physical Exam: BP 124/62 (BP Location: Left Arm, Patient Position: Sitting, Cuff Size: Normal)   Pulse (!) 53   Ht 5' 11"$  (1.803 m)   Wt 145 lb (65.8 kg)   SpO2 96%   BMI 20.22 kg/m   General:  NAD. Neck: No JVD or HJR. Lungs: Mildly diminished breath sounds throughout without wheezes or crackles. Heart: Bradycardic but regular without murmurs. Abdomen: Soft, nontender, nondistended. Extremities: 1+ ankle edema bilaterally.  EKG: Sinus bradycardia with PACs and low voltage.  Heart rate has decreased since 02/19/2022.  Lab Results  Component Value Date   WBC 20.3 (H) 12/20/2021   HGB 13.8 12/20/2021   HCT 41.6 12/20/2021   MCV 92.7 12/20/2021   PLT 239 12/20/2021  Lab Results  Component Value Date   NA 138 02/19/2022   K 4.2 02/19/2022   CL 103 02/19/2022   CO2 28 02/19/2022   BUN 12 02/19/2022   CREATININE 1.03 02/19/2022   GLUCOSE 108 (H) 02/19/2022   ALT 29 02/19/2022    Lab Results  Component Value Date   CHOL 127 02/19/2022   HDL 55 02/19/2022   LDLCALC 57 02/19/2022   TRIG 73 02/19/2022   CHOLHDL 2.3 02/19/2022    --------------------------------------------------------------------------------------------------  ASSESSMENT AND PLAN: Coronary artery disease: No angina reported.  Chronic intermittent dyspnea is unchanged from baseline.  Recent coronary CTA was reassuring with mild-moderate, nonobstructive CAD.  We will plan to continue rosuvastatin to help prevent progression of disease.  LDL excellent on last check a month ago.  Defer aspirin in the setting of long-term anticoagulation with rivaroxaban.  Paroxysmal atrial fibrillation: Mr. Chadwick is maintaining sinus rhythm today, albeit with more bradycardia.  We will decrease metoprolol succinate to 12.5 mg daily.  Continue  current doses of flecainide and rivaroxaban.  I encouraged Mr. Sare to keep his follow-up appointment with Dr. Quentin Ore next month.  PAD: No claudication reported.  Continue statin therapy and rivaroxaban and lieu of aspirin.  Ongoing management per vascular surgery.  COPD: Likely contributing to chronic intermittent dyspnea.  Symptoms are stable.  Follow-up: Return to clinic in 6 months.  Nelva Bush, MD 03/21/2022 9:30 AM

## 2022-03-21 NOTE — Patient Instructions (Addendum)
Medication Instructions:  Your physician recommends the following medication changes.  DECREASE: Metoprolol to 12.5 mg (1/2 tablet) my mouth daily  *If you need a refill on your cardiac medications before your next appointment, please call your pharmacy*   Lab Work: None ordered today   Testing/Procedures: None ordered today   Follow-Up: At [facility], you and your health needs are our priority.  As part of our continuing mission to provide you with exceptional heart care, we have created designated Provider Care Teams.  These Care Teams include your primary Cardiologist (physician) and Advanced Practice Providers (APPs -  Physician Assistants and Nurse Practitioners) who all work together to provide you with the care you need, when you need it.  We recommend signing up for the patient portal called "MyChart".  Sign up information is provided on this After Visit Summary.  MyChart is used to connect with patients for Virtual Visits (Telemedicine).  Patients are able to view lab/test results, encounter notes, upcoming appointments, etc.  Non-urgent messages can be sent to your provider as well.   To learn more about what you can do with MyChart, go to [email or web address].    Your next appointment:   6 month(s)  Provider:   You may see Nelva Bush, MD or one of the following Advanced Practice Providers on your designated Care Team:   Murray Hodgkins, NP Christell Faith, PA-C Cadence Kathlen Mody, PA-C Gerrie Nordmann, NP    Please call Pulmonary Rehab at [phone]

## 2022-03-23 ENCOUNTER — Encounter (INDEPENDENT_AMBULATORY_CARE_PROVIDER_SITE_OTHER): Payer: Self-pay | Admitting: Nurse Practitioner

## 2022-03-23 NOTE — Progress Notes (Signed)
Subjective:    Patient ID: Antonio [name], male    DOB: 1945-09-30, 77 y.o.   MRN: [id number] Chief Complaint  Patient presents with   Follow-up    1 year follow up with carotid    Patient returns today in follow up of multiple vascular issues.  His claudication symptoms are doing reasonably well.  He is more limited by his breathing issues than anything.  Claudication symptoms on the left lower extremity are fairly mild.  He has no new open wounds, infection or symptoms worrisome for rest pain.  We also follow his carotid disease.  He currently has no focal neurological symptoms.  Today the patient has 1 to 39% stenosis of the bilateral internal carotid arteries.  Normal flow hemodynamics in the bilateral subclavian arteries.  Antegrade flow in the bilateral vertebral arteries.  Today the patient has ABI of 1.14 on the right and 0.62 on the left.  The ABI remains unchanged on the right and slightly improved on the left.  There are biphasic tibial artery waveforms on the right with monophasic on the left.  Slightly dampened toe waveforms on the left.     Review of Systems  All other systems reviewed and are negative.      Objective:   Physical Exam Vitals reviewed.  Cardiovascular:     Rate and Rhythm: Normal rate.     Pulses:          Dorsalis pedis pulses are detected w/ Doppler on the right side and detected w/ Doppler on the left side.       Posterior tibial pulses are detected w/ Doppler on the right side and detected w/ Doppler on the left side.  Pulmonary:     Effort: Pulmonary effort is normal.  Skin:    General: Skin is warm and dry.  Neurological:     Mental Status: He is alert and oriented to person, place, and time.  Psychiatric:        Mood and Affect: Mood normal.        Behavior: Behavior normal.        Thought Content: Thought content normal.        Judgment: Judgment normal.     BP (!) 112/55   Pulse (!) 59   Ht 5' 10"$  (1.778 m)   Wt 143 lb  (64.9 kg)   BMI 20.52 kg/m   Past Medical History:  Diagnosis Date   Allergy    Calf pain    COPD (chronic obstructive pulmonary disease) (HCC)    no Oxygen per pt   COVID-19    Dyspnea    Hypokalemia    Internal hemorrhoids    Internal hemorrhoids    Muscle cramp    Muscle pain    PAD (peripheral artery disease) (HCC)    Paroxysmal atrial fibrillation ([place]) 08/2019   Pneumonia    PONV (postoperative nausea and vomiting)    Tubular adenoma of colon    Ulcerative colitis ([place])     Social History   Socioeconomic History   Marital status: Married    Spouse name: Not on file   Number of children: 2   Years of education: Not on file   Highest education level: Bachelor's degree (e.g., BA, AB, BS)  Occupational History   Occupation: retired  Tobacco Use   Smoking status: Former    Packs/day: 0.50    Years: 35.00    Total pack years: 17.50    Types: Cigarettes, Cigars  Quit date: 06/11/2011    Years since quitting: 10.7   Smokeless tobacco: Never   Tobacco comments:    Former smoker 12/19/2020  Vaping Use   Vaping Use: Former  Substance and Sexual Activity   Alcohol use: Yes    Alcohol/week: 6.0 standard drinks of alcohol    Types: 6 Shots of liquor per week    Comment: wine, liquor, or beer   Drug use: No   Sexual activity: Yes  Other Topics Concern   Not on file  Social History Narrative   Not on file   Social Determinants of Health   Financial Resource Strain: Low Risk  (04/16/2017)   Overall Financial Resource Strain (CARDIA)    Difficulty of Paying Living Expenses: Not hard at all  Food Insecurity: No Food Insecurity (04/16/2017)   Hunger Vital Sign    Worried About Running Out of Food in the Last Year: Never true    Ran Out of Food in the Last Year: Never true  Transportation Needs: No Transportation Needs (04/16/2017)   PRAPARE - Hydrologist (Medical): No    Lack of Transportation (Non-Medical): No  Physical Activity:  Inactive (04/16/2017)   Exercise Vital Sign    Days of Exercise per Week: 0 days    Minutes of Exercise per Session: 0 min  Stress: No Stress Concern Present (04/16/2017)   [place]    Feeling of Stress : Not at all  Social Connections: Unknown (04/16/2017)   Social Connection and Isolation Panel [NHANES]    Frequency of Communication with Friends and Family: Patient refused    Frequency of Social Gatherings with Friends and Family: Patient refused    Attends Religious Services: Patient refused    Active Member of Clubs or Organizations: Patient refused    Attends Archivist Meetings: Patient refused    Marital Status: Married  Human resources officer Violence: Not At Risk (04/16/2017)   Humiliation, Afraid, Rape, and Kick questionnaire    Fear of Current or Ex-Partner: No    Emotionally Abused: No    Physically Abused: No    Sexually Abused: No    Past Surgical History:  Procedure Laterality Date   ANTERIOR CERVICAL DECOMP/DISCECTOMY FUSION N/A 05/05/2016   Procedure: ANTERIOR CERVICAL DECOMPRESSION FUSION CERVICAL THREE-FOUR.;  Surgeon: Earnie Larsson, MD;  Location: [facility];  Service: Neurosurgery;  Laterality: N/A;  right side approach   CATARACT EXTRACTION Bilateral    CERVICAL DISCECTOMY  04/2016   C3-C4   COLONOSCOPY     COLONOSCOPY  08/09/2015   HEMORRHOID SURGERY  1988   leg stent Left approx 5-6 yrs ago   2 to left leg   MENISCUS REPAIR Left 12/2013   MOHS SURGERY     POLYPECTOMY     ROTATOR CUFF REPAIR Left 2007   TONSILLECTOMY AND ADENOIDECTOMY  1957    Family History  Problem Relation Age of Onset   Cancer Mother        leukemia   Colon polyps Mother    Heart disease Father    Heart attack Father 48   Healthy Daughter    Healthy Son    Colon cancer Neg Hx    Esophageal cancer Neg Hx    Rectal cancer Neg Hx    Stomach cancer Neg Hx     Allergies  Allergen Reactions   Penicillins Rash     Has patient had a PCN reaction causing immediate  rash, facial/tongue/throat swelling, SOB or lightheadedness with hypotension:Yes Has patient had a PCN reaction causing severe rash involving mucus membranes or skin necrosis:Yes Has patient had a PCN reaction that required hospitalization:No Has patient had a PCN reaction occurring within the last 10 years:No If all of the above answers are "NO", then may proceed with Cephalosporin use.        Latest Ref Rng & Units 12/20/2021    5:54 PM 12/13/2021    4:31 AM 07/03/2021   11:41 AM  CBC  WBC 4.0 - 10.5 K/uL 20.3  16.3  11.6   Hemoglobin 13.0 - 17.0 g/dL 13.8  14.9  15.6   Hematocrit 39.0 - 52.0 % 41.6  46.1  47.1   Platelets 150 - 400 K/uL 239  236  247       CMP     Component Value Date/Time   NA 138 02/19/2022 1122   NA 143 12/14/2019 0931   NA 138 10/01/2013 2131   K 4.2 02/19/2022 1122   K 4.2 10/01/2013 2131   CL 103 02/19/2022 1122   CL 107 10/01/2013 2131   CO2 28 02/19/2022 1122   CO2 23 10/01/2013 2131   GLUCOSE 108 (H) 02/19/2022 1122   GLUCOSE 118 (H) 10/01/2013 2131   BUN 12 02/19/2022 1122   BUN 9 12/14/2019 0931   BUN 11 10/01/2013 2131   CREATININE 1.03 02/19/2022 1122   CREATININE 0.93 10/01/2013 2131   CALCIUM 9.2 02/19/2022 1122   CALCIUM 8.7 10/01/2013 2131   PROT 7.5 12/13/2021 0431   PROT 7.6 12/14/2019 0931   ALBUMIN 3.6 12/13/2021 0431   ALBUMIN 4.8 (H) 12/14/2019 0931   AST 22 12/13/2021 0431   ALT 29 02/19/2022 1122   ALKPHOS 69 12/13/2021 0431   BILITOT 0.7 12/13/2021 0431   BILITOT 0.8 12/14/2019 0931   GFRNONAA >60 02/19/2022 1122   GFRNONAA >60 10/01/2013 2131   GFRAA 91 12/14/2019 0931   GFRAA >60 10/01/2013 2131     VAS [place] ABI WITH/WO TBI  Result Date: 03/10/2022  LOWER EXTREMITY DOPPLER STUDY Patient Name:  Antonio [name]  Date of Exam:   03/05/2022 Medical Rec #: [id number]              Accession #:    [id number] Date of Birth: 12/09/1945              Patient Gender: M  Patient Age:   49 years Exam Location:  [facility] Procedure:      VAS [place] ABI WITH/WO TBI Referring Phys: Leotis Pain --------------------------------------------------------------------------------  Indications: Peripheral artery disease.  Performing Technologist: Almira Coaster RVS  Examination Guidelines: A complete evaluation includes at minimum, Doppler waveform signals and systolic blood pressure reading at the level of bilateral brachial, anterior tibial, and posterior tibial arteries, when vessel segments are accessible. Bilateral testing is considered an integral part of a complete examination. Photoelectric Plethysmograph (PPG) waveforms and toe systolic pressure readings are included as required and additional duplex testing as needed. Limited examinations for reoccurring indications may be performed as noted.  ABI Findings: +---------+------------------+-----+--------+--------+ Right    Rt Pressure (mmHg)IndexWaveformComment  +---------+------------------+-----+--------+--------+ Brachial 128                                     +---------+------------------+-----+--------+--------+ ATA      146  1.14 biphasic         +---------+------------------+-----+--------+--------+ PTA      118               0.92 biphasic         +---------+------------------+-----+--------+--------+ Great Toe88                0.69 Normal           +---------+------------------+-----+--------+--------+ +---------+------------------+-----+----------+-------+ Left     Lt Pressure (mmHg)IndexWaveform  Comment +---------+------------------+-----+----------+-------+ Brachial 124                                      +---------+------------------+-----+----------+-------+ ATA      72                0.56 monophasic        +---------+------------------+-----+----------+-------+ PERO     79                0.62 monophasic         +---------+------------------+-----+----------+-------+ Great Toe61                0.48 Abnormal          +---------+------------------+-----+----------+-------+ +-------+-----------+-----------+------------+------------+ ABI/TBIToday's ABIToday's TBIPrevious ABIPrevious TBI +-------+-----------+-----------+------------+------------+ Right  1.14       .69        1.14        .73          +-------+-----------+-----------+------------+------------+ Left   .62        .48        .55         .40          +-------+-----------+-----------+------------+------------+ Left ABIs and TBIs appear increased compared to prior study on 03/05/2021. Right ABIs and TBIs appear essentially unchanged compared to prior study on 03/05/2021.  Summary: Right: Resting right ankle-brachial index is within normal range. The right toe-brachial index is normal. Left: Resting left ankle-brachial index indicates moderate left lower extremity arterial disease. The left toe-brachial index is abnormal. *See table(s) above for measurements and observations.   Electronically signed by Leotis Pain MD on 03/10/2022 at 8:22:45 AM.    Final        Assessment & Plan:   1. Atherosclerosis of native artery of left lower extremity with intermittent claudication (HCC) His ABIs today are stable on the right at 1.14 but have dropped on the left down to 0.55.  With minimal symptoms, we will still not plan any intervention at this time.  We will check him in 1 year.  He will contact our office if symptoms worsen.  Continue current medical regimen.   2. Bilateral carotid artery stenosis Recommend:  Given the patient's asymptomatic subcritical stenosis no further invasive testing or surgery at this time.  Duplex ultrasound shows <40% stenosis bilaterally.  Continue antiplatelet therapy as prescribed Continue management of CAD, HTN and Hyperlipidemia Healthy heart diet,  encouraged exercise at least 4 times per week Follow up in  12 months with duplex ultrasound and physical exam   3. Pulmonary emphysema, unspecified emphysema type ([place]) Continue pulmonary medications and aerosols as already ordered, these medications have been reviewed and there are no changes at this time.    Current Outpatient Medications on File Prior to Visit  Medication Sig Dispense Refill   albuterol (PROVENTIL) (2.5 MG/3ML) 0.083% nebulizer solution Take 3 mLs (2.5 mg total) by nebulization every 6 (six) hours as  needed for wheezing or shortness of breath. 225 mL 12   albuterol (VENTOLIN HFA) 108 (90 Base) MCG/ACT inhaler Inhale 2 puffs into the lungs every 6 (six) hours as needed for wheezing or shortness of breath. 8 g 2   azelastine (ASTELIN) 0.1 % nasal spray Place 1 spray into both nostrils 2 (two) times daily. Use in each nostril as directed 30 mL 12   dupilumab (DUPIXENT) 300 MG/2ML prefilled syringe Inject 300 mg into the skin every 14 (fourteen) days. 12 mL 1   famotidine (PEPCID) 20 MG tablet Take 1 tablet (20 mg total) by mouth 2 (two) times daily as needed for heartburn or indigestion.     flecainide (TAMBOCOR) 50 MG tablet Take 1.5 tablets (75 mg total) by mouth 2 (two) times daily. 270 tablet 3   furosemide (LASIX) 20 MG tablet Take 1 tablet (20 mg total) by mouth daily as needed (for swelling or weight gain of 3 lbs overnight or 5 lbs in a week). 30 tablet 3   hydrocortisone 2.5 % cream Apply 1 application. topically as needed.     ipratropium (ATROVENT) 0.02 % nebulizer solution Take 2.5 mLs (0.5 mg total) by nebulization 4 (four) times daily. 75 mL 12   mesalamine (LIALDA) 1.2 g EC tablet TAKE 4 TABLETS BY MOUTH DAILY WITH BREAKFAST 120 tablet 0   ondansetron (ZOFRAN-ODT) 4 MG disintegrating tablet Take 1 tablet (4 mg total) by mouth every 8 (eight) hours as needed for nausea or vomiting. 12 tablet 0   rivaroxaban (XARELTO) 20 MG TABS tablet Take 1 tablet (20 mg total) by mouth daily with supper. 90 tablet 3   rosuvastatin  (CRESTOR) 10 MG tablet Take 1 tablet (10 mg total) by mouth daily. 90 tablet 3   SYMBICORT 160-4.5 MCG/ACT inhaler Inhale 2 puffs into the lungs 2 (two) times daily. 10.2 g 12   tamsulosin (FLOMAX) 0.4 MG CAPS capsule Take 1 capsule (0.4 mg total) by mouth daily. 90 capsule 3   Current Facility-Administered Medications on File Prior to Visit  Medication Dose Route Frequency Provider Last Rate Last Admin   albuterol (PROVENTIL) (2.5 MG/3ML) 0.083% nebulizer solution 2.5 mg  2.5 mg Nebulization Once Tyler Pita, MD        There are no Patient Instructions on file for this visit. No follow-ups on file.   Kris Hartmann, NP

## 2022-03-25 DIAGNOSIS — H02831 Dermatochalasis of right upper eyelid: Secondary | ICD-10-CM | POA: Diagnosis not present

## 2022-03-25 DIAGNOSIS — H04123 Dry eye syndrome of bilateral lacrimal glands: Secondary | ICD-10-CM | POA: Diagnosis not present

## 2022-03-25 DIAGNOSIS — H35363 Drusen (degenerative) of macula, bilateral: Secondary | ICD-10-CM | POA: Diagnosis not present

## 2022-03-25 DIAGNOSIS — H02834 Dermatochalasis of left upper eyelid: Secondary | ICD-10-CM | POA: Diagnosis not present

## 2022-04-01 ENCOUNTER — Telehealth: Payer: Self-pay | Admitting: *Deleted

## 2022-04-01 NOTE — Patient Outreach (Signed)
  Care Coordination   04/01/2022 Name: Antonio [name] MRN: [id number] DOB: [birth year]   Care Coordination Outreach Attempts:  An unsuccessful telephone outreach was attempted today to offer the patient information about available care coordination services as a benefit of their health plan.   Follow Up Plan:  Additional outreach attempts will be made to offer the patient care coordination information and services.   Encounter Outcome:  No Answer   Care Coordination Interventions:  No, not indicated    Valente David, RN, MSN, [facility] Care Management Care Management Coordinator [phone]

## 2022-04-03 ENCOUNTER — Telehealth: Payer: Self-pay | Admitting: *Deleted

## 2022-04-03 NOTE — Patient Outreach (Signed)
  Care Coordination   04/03/2022 Name: Shorty Schoene MRN: [id number] DOB: [birth year]   Care Coordination Outreach Attempts:  A second unsuccessful outreach was attempted today to offer the patient with information about available care coordination services as a benefit of their health plan.     Follow Up Plan:  Additional outreach attempts will be made to offer the patient care coordination information and services.   Encounter Outcome:  No Answer   Care Coordination Interventions:  No, not indicated    Valente David, RN, MSN, [facility] Care Management Care Management Coordinator [phone]

## 2022-04-05 ENCOUNTER — Other Ambulatory Visit: Payer: Self-pay | Admitting: Internal Medicine

## 2022-04-07 ENCOUNTER — Encounter: Payer: Self-pay | Admitting: *Deleted

## 2022-04-07 ENCOUNTER — Telehealth: Payer: Self-pay | Admitting: *Deleted

## 2022-04-07 NOTE — Patient Instructions (Signed)
Visit Information  Thank you for taking time to visit with me today. Please don't hesitate to contact me if I can be of assistance to you.  Following are the goals we discussed today:  Call PCP office to ask when next follow up is needed. Continue checking oxygen level, blood pressure, and heart rate multiple times a week.  Please call the Suicide and Crisis Lifeline: 988 call the [place] National Suicide Prevention Lifeline: 803-295-2346 or TTY: (680)235-7004 TTY (865)375-7188) to talk to a trained counselor call 1-800-273-TALK (toll free, 24 hour hotline) call 911 if you are experiencing a Mental Health or [place] or need someone to talk to.  Patient verbalizes understanding of instructions and care plan provided today and agrees to view in [place]. Active MyChart status and patient understanding of how to access instructions and care plan via MyChart confirmed with patient.     The patient has been provided with contact information for the care management team and has been advised to call with any health related questions or concerns.   [place] Management Care Management Coordinator [phone]

## 2022-04-07 NOTE — Patient Outreach (Signed)
  Care Coordination   Initial Visit Note   04/07/2022 Name: Jaiyon Marchewka MRN: [id number] DOB: [birth year]  Uber Barwick is a 77 y.o. year old male who sees [name], MD for primary care. I spoke with  Sherolyn Buba by phone today.  What matters to the patients health and wellness today?  Patient and wife both state he is doing well, does not feel follow up is needed but will call with questions.     Goals Addressed             This Visit's Progress    COMPLETED: Care Coordination Activities - No follow up needed       Care Coordination Interventions: Evaluation of current treatment plan related to chronic medical conditions (COPD, CAD, A-fib) and patient's adherence to plan as established by provider Advised patient to continue monitoring oxygen level, blood pressure, and heart rate several days in the week Reviewed medications with patient and discussed affordability and adherence Reviewed scheduled/upcoming provider appointments including cardiology next month, advised to call to inquire about PCP follow up Discussed plans with patient for ongoing care management follow up and provided patient with direct contact information for care management team Screening for signs and symptoms of depression related to chronic disease state  Assessed social determinant of health barriers         SDOH assessments and interventions completed:  Yes  SDOH Interventions Today    Flowsheet Row Most Recent Value  SDOH Interventions   Food Insecurity Interventions Intervention Not Indicated  Housing Interventions Intervention Not Indicated  Transportation Interventions Intervention Not Indicated        Care Coordination Interventions:  Yes, provided   Follow up plan: No further intervention required.   Encounter Outcome:  Pt. Visit Completed   Valente David, RN, MSN, [place] Care Management Care Management Coordinator [phone]

## 2022-04-17 ENCOUNTER — Encounter: Payer: Self-pay | Admitting: Pulmonary Disease

## 2022-04-27 NOTE — Progress Notes (Unsigned)
  Electrophysiology Office Follow up Visit Note:    Date:  04/30/2022   ID:  Antonio [name], [place] October 08, 1945, MRN [id number]  PCP:  [name], MD  [facility] Cardiologist:  Nelva Bush, MD  [facility] Electrophysiologist:  Vickie Epley, MD    Interval History:    Antonio [name] is a 77 y.o. male who presents for a follow up visit.   Last seen 10/30/2021. Hx of AF on flecainide. He is not a candidate for invasive procedures due to severe lung disease. He is on xarelto for stroke ppx.   He is with his wife today in clinic.  He has been doing well.  No new complaints.    Past medical, surgical, social and family history were reviewed.  ROS:   Please see the history of present illness.    All other systems reviewed and are negative.  EKGs/Labs/Other Studies Reviewed:    The following studies were reviewed today:    EKG:  The ekg ordered today demonstrates sinus rhythm, sinus arrhythmia, PR interval 204 ms, QRS duration 78 ms   Physical Exam:    VS:  BP 122/64   Pulse 61   Ht 5\' 11"  (1.803 m)   Wt 144 lb (65.3 kg)   BMI 20.08 kg/m     Wt Readings from Last 3 Encounters:  04/30/22 144 lb (65.3 kg)  03/21/22 145 lb (65.8 kg)  03/05/22 143 lb (64.9 kg)     GEN:  Well nourished, well developed in no acute distress CARDIAC: RRR, no murmurs, rubs, gallops RESPIRATORY:  Clear to auscultation without rales, wheezing or rhonchi       ASSESSMENT:    1. Paroxysmal atrial fibrillation (HCC)   2. Encounter for long-term (current) use of high-risk medication   3. Chronic obstructive pulmonary disease, unspecified COPD type ([place])    PLAN:    In order of problems listed above:   #pAF #High risk med - flecainide On flecainide 75mg  PO BID. On xarelto for stroke ppx. ECG today with stable intervals for continued flecainide use.  #COPD Breathing stable. Lung disease prevents invasive EP procedures.    Follow up 6 months with  APP.     Signed, Lars Mage, MD, [facility], [facility] 04/30/2022 1:33 PM    Electrophysiology [facility]

## 2022-04-30 ENCOUNTER — Encounter: Payer: Self-pay | Admitting: Cardiology

## 2022-04-30 ENCOUNTER — Ambulatory Visit: Payer: PPO | Attending: Cardiology | Admitting: Cardiology

## 2022-04-30 VITALS — BP 122/64 | HR 61 | Ht 71.0 in | Wt 144.0 lb

## 2022-04-30 DIAGNOSIS — I480 Paroxysmal atrial fibrillation: Secondary | ICD-10-CM

## 2022-04-30 DIAGNOSIS — Z79899 Other long term (current) drug therapy: Secondary | ICD-10-CM | POA: Diagnosis not present

## 2022-04-30 DIAGNOSIS — J449 Chronic obstructive pulmonary disease, unspecified: Secondary | ICD-10-CM | POA: Diagnosis not present

## 2022-04-30 NOTE — Patient Instructions (Signed)
Medication Instructions:  Your physician recommends that you continue on your current medications as directed. Please refer to the Current Medication list given to you today.  *If you need a refill on your cardiac medications before your next appointment, please call your pharmacy*  Follow-Up: At [facility], you and your health needs are our priority.  As part of our continuing mission to provide you with exceptional heart care, we have created designated Provider Care Teams.  These Care Teams include your primary Cardiologist (physician) and Advanced Practice Providers (APPs -  Physician Assistants and Nurse Practitioners) who all work together to provide you with the care you need, when you need it.  Your next appointment:   October 2024  Provider:   Mamie Levers, NP

## 2022-05-12 ENCOUNTER — Other Ambulatory Visit: Payer: Self-pay | Admitting: Pulmonary Disease

## 2022-05-12 DIAGNOSIS — J300 Vasomotor rhinitis: Secondary | ICD-10-CM

## 2022-05-14 ENCOUNTER — Other Ambulatory Visit: Payer: Self-pay | Admitting: Pulmonary Disease

## 2022-05-14 DIAGNOSIS — J300 Vasomotor rhinitis: Secondary | ICD-10-CM

## 2022-05-22 ENCOUNTER — Telehealth: Payer: Self-pay | Admitting: Pulmonary Disease

## 2022-05-22 ENCOUNTER — Emergency Department: Payer: PPO

## 2022-05-22 ENCOUNTER — Encounter: Payer: Self-pay | Admitting: Internal Medicine

## 2022-05-22 ENCOUNTER — Inpatient Hospital Stay
Admission: EM | Admit: 2022-05-22 | Discharge: 2022-05-28 | DRG: 871 | Disposition: A | Discharge status: Home Health | Payer: PPO | Attending: Internal Medicine | Admitting: Internal Medicine

## 2022-05-22 ENCOUNTER — Other Ambulatory Visit: Payer: Self-pay

## 2022-05-22 DIAGNOSIS — I2510 Atherosclerotic heart disease of native coronary artery without angina pectoris: Secondary | ICD-10-CM | POA: Diagnosis present

## 2022-05-22 DIAGNOSIS — Z7951 Long term (current) use of inhaled steroids: Secondary | ICD-10-CM

## 2022-05-22 DIAGNOSIS — Z9981 Dependence on supplemental oxygen: Secondary | ICD-10-CM

## 2022-05-22 DIAGNOSIS — Z8249 Family history of ischemic heart disease and other diseases of the circulatory system: Secondary | ICD-10-CM

## 2022-05-22 DIAGNOSIS — Z960 Presence of urogenital implants: Secondary | ICD-10-CM | POA: Diagnosis present

## 2022-05-22 DIAGNOSIS — E782 Mixed hyperlipidemia: Secondary | ICD-10-CM | POA: Diagnosis present

## 2022-05-22 DIAGNOSIS — N401 Enlarged prostate with lower urinary tract symptoms: Secondary | ICD-10-CM | POA: Diagnosis present

## 2022-05-22 DIAGNOSIS — I4892 Unspecified atrial flutter: Secondary | ICD-10-CM | POA: Diagnosis not present

## 2022-05-22 DIAGNOSIS — N400 Enlarged prostate without lower urinary tract symptoms: Secondary | ICD-10-CM | POA: Insufficient documentation

## 2022-05-22 DIAGNOSIS — J449 Chronic obstructive pulmonary disease, unspecified: Secondary | ICD-10-CM | POA: Diagnosis not present

## 2022-05-22 DIAGNOSIS — J9621 Acute and chronic respiratory failure with hypoxia: Secondary | ICD-10-CM | POA: Diagnosis not present

## 2022-05-22 DIAGNOSIS — Z95820 Peripheral vascular angioplasty status with implants and grafts: Secondary | ICD-10-CM

## 2022-05-22 DIAGNOSIS — Z87898 Personal history of other specified conditions: Secondary | ICD-10-CM

## 2022-05-22 DIAGNOSIS — I471 Supraventricular tachycardia, unspecified: Secondary | ICD-10-CM | POA: Diagnosis present

## 2022-05-22 DIAGNOSIS — Z8616 Personal history of COVID-19: Secondary | ICD-10-CM

## 2022-05-22 DIAGNOSIS — E876 Hypokalemia: Secondary | ICD-10-CM | POA: Diagnosis not present

## 2022-05-22 DIAGNOSIS — I110 Hypertensive heart disease with heart failure: Secondary | ICD-10-CM | POA: Diagnosis not present

## 2022-05-22 DIAGNOSIS — I480 Paroxysmal atrial fibrillation: Secondary | ICD-10-CM | POA: Diagnosis not present

## 2022-05-22 DIAGNOSIS — I493 Ventricular premature depolarization: Secondary | ICD-10-CM | POA: Diagnosis present

## 2022-05-22 DIAGNOSIS — Z7901 Long term (current) use of anticoagulants: Secondary | ICD-10-CM | POA: Diagnosis not present

## 2022-05-22 DIAGNOSIS — J440 Chronic obstructive pulmonary disease with acute lower respiratory infection: Secondary | ICD-10-CM | POA: Diagnosis not present

## 2022-05-22 DIAGNOSIS — Z79620 Long term (current) use of immunosuppressive biologic: Secondary | ICD-10-CM

## 2022-05-22 DIAGNOSIS — R0602 Shortness of breath: Secondary | ICD-10-CM | POA: Diagnosis not present

## 2022-05-22 DIAGNOSIS — K5190 Ulcerative colitis, unspecified, without complications: Secondary | ICD-10-CM | POA: Diagnosis present

## 2022-05-22 DIAGNOSIS — Z83719 Family history of colon polyps, unspecified: Secondary | ICD-10-CM

## 2022-05-22 DIAGNOSIS — I10 Essential (primary) hypertension: Secondary | ICD-10-CM | POA: Diagnosis not present

## 2022-05-22 DIAGNOSIS — R6520 Severe sepsis without septic shock: Secondary | ICD-10-CM | POA: Diagnosis not present

## 2022-05-22 DIAGNOSIS — Z806 Family history of leukemia: Secondary | ICD-10-CM

## 2022-05-22 DIAGNOSIS — Z87891 Personal history of nicotine dependence: Secondary | ICD-10-CM

## 2022-05-22 DIAGNOSIS — I4891 Unspecified atrial fibrillation: Secondary | ICD-10-CM | POA: Diagnosis present

## 2022-05-22 DIAGNOSIS — Z86010 Personal history of colonic polyps: Secondary | ICD-10-CM

## 2022-05-22 DIAGNOSIS — I2489 Other forms of acute ischemic heart disease: Secondary | ICD-10-CM

## 2022-05-22 DIAGNOSIS — I4940 Unspecified premature depolarization: Secondary | ICD-10-CM | POA: Diagnosis present

## 2022-05-22 DIAGNOSIS — J189 Pneumonia, unspecified organism: Secondary | ICD-10-CM | POA: Diagnosis not present

## 2022-05-22 DIAGNOSIS — Z1152 Encounter for screening for COVID-19: Secondary | ICD-10-CM

## 2022-05-22 DIAGNOSIS — A419 Sepsis, unspecified organism: Secondary | ICD-10-CM | POA: Diagnosis not present

## 2022-05-22 DIAGNOSIS — F419 Anxiety disorder, unspecified: Secondary | ICD-10-CM | POA: Diagnosis present

## 2022-05-22 DIAGNOSIS — J188 Other pneumonia, unspecified organism: Secondary | ICD-10-CM | POA: Diagnosis not present

## 2022-05-22 DIAGNOSIS — I739 Peripheral vascular disease, unspecified: Secondary | ICD-10-CM | POA: Diagnosis present

## 2022-05-22 DIAGNOSIS — I5032 Chronic diastolic (congestive) heart failure: Secondary | ICD-10-CM | POA: Diagnosis present

## 2022-05-22 DIAGNOSIS — G4734 Idiopathic sleep related nonobstructive alveolar hypoventilation: Secondary | ICD-10-CM

## 2022-05-22 DIAGNOSIS — E7800 Pure hypercholesterolemia, unspecified: Secondary | ICD-10-CM

## 2022-05-22 DIAGNOSIS — Z981 Arthrodesis status: Secondary | ICD-10-CM

## 2022-05-22 DIAGNOSIS — E785 Hyperlipidemia, unspecified: Secondary | ICD-10-CM | POA: Diagnosis not present

## 2022-05-22 DIAGNOSIS — Z880 Allergy status to penicillin: Secondary | ICD-10-CM

## 2022-05-22 DIAGNOSIS — R338 Other retention of urine: Secondary | ICD-10-CM | POA: Diagnosis present

## 2022-05-22 DIAGNOSIS — Z79899 Other long term (current) drug therapy: Secondary | ICD-10-CM

## 2022-05-22 HISTORY — DX: Essential (primary) hypertension: I10

## 2022-05-22 HISTORY — DX: Atherosclerotic heart disease of native coronary artery without angina pectoris: I25.10

## 2022-05-22 HISTORY — DX: Mixed hyperlipidemia: E78.2

## 2022-05-22 HISTORY — DX: Personal history of other medical treatment: Z92.89

## 2022-05-22 LAB — CBC
HCT: 44.5 % (ref 39.0–52.0)
Hemoglobin: 15.1 g/dL (ref 13.0–17.0)
MCH: 30.8 pg (ref 26.0–34.0)
MCHC: 33.9 g/dL (ref 30.0–36.0)
MCV: 90.6 fL (ref 80.0–100.0)
Platelets: 188 10*3/uL (ref 150–400)
RBC: 4.91 MIL/uL (ref 4.22–5.81)
RDW: 11.6 % (ref 11.5–15.5)
WBC: 13.2 10*3/uL — ABNORMAL HIGH (ref 4.0–10.5)
nRBC: 0 % (ref 0.0–0.2)

## 2022-05-22 LAB — BASIC METABOLIC PANEL
Anion gap: 13 (ref 5–15)
BUN: 13 mg/dL (ref 8–23)
CO2: 19 mmol/L — ABNORMAL LOW (ref 22–32)
Calcium: 8.8 mg/dL — ABNORMAL LOW (ref 8.9–10.3)
Chloride: 97 mmol/L — ABNORMAL LOW (ref 98–111)
Creatinine, Ser: 0.88 mg/dL (ref 0.61–1.24)
GFR, Estimated: >60 mL/min (ref >60)
Glucose, Bld: 116 mg/dL — ABNORMAL HIGH (ref 70–99)
Potassium: 3.7 mmol/L (ref 3.5–5.1)
Sodium: 129 mmol/L — ABNORMAL LOW (ref 135–145)

## 2022-05-22 LAB — PROTIME-INR
INR: 1.5 — ABNORMAL HIGH (ref 0.8–1.2)
Prothrombin Time: 17.5 seconds — ABNORMAL HIGH (ref 11.4–15.2)

## 2022-05-22 LAB — RESP PANEL BY RT-PCR (RSV, FLU A&B, COVID)  RVPGX2
Influenza A by PCR: NEGATIVE
Influenza B by PCR: NEGATIVE
Resp Syncytial Virus by PCR: NEGATIVE
SARS Coronavirus 2 by RT PCR: NEGATIVE

## 2022-05-22 LAB — TROPONIN I (HIGH SENSITIVITY)
Troponin I (High Sensitivity): 15 ng/L (ref <18)
Troponin I (High Sensitivity): 34 ng/L — ABNORMAL HIGH (ref <18)

## 2022-05-22 LAB — LACTIC ACID, PLASMA
Lactic Acid, Venous: 2.7 mmol/L (ref 0.5–1.9)
Lactic Acid, Venous: 1.2 mmol/L (ref 0.5–1.9)

## 2022-05-22 LAB — APTT: aPTT: 41 seconds — ABNORMAL HIGH (ref 24–36)

## 2022-05-22 MED ORDER — ONDANSETRON HCL 4 MG PO TABS
4.0000 mg | ORAL_TABLET | Freq: Four times a day (QID) | ORAL | Status: DC | PRN
  Administered 2022-05-24: 4 mg via ORAL
  Filled 2022-05-22: qty 1

## 2022-05-22 MED ORDER — SODIUM CHLORIDE 0.9 % IV SOLN
500.0000 mg | INTRAVENOUS | Status: DC
  Administered 2022-05-22 – 2022-05-23 (×2): 500 mg via INTRAVENOUS
  Filled 2022-05-22 (×2): qty 5
  Filled 2022-05-22: qty 500

## 2022-05-22 MED ORDER — RIVAROXABAN 20 MG PO TABS
20.0000 mg | ORAL_TABLET | Freq: Every day | ORAL | Status: DC
  Administered 2022-05-23 – 2022-05-27 (×5): 20 mg via ORAL
  Filled 2022-05-22 (×5): qty 1

## 2022-05-22 MED ORDER — ACETAMINOPHEN 325 MG PO TABS
650.0000 mg | ORAL_TABLET | Freq: Four times a day (QID) | ORAL | Status: DC | PRN
  Administered 2022-05-24: 650 mg via ORAL
  Filled 2022-05-22: qty 2

## 2022-05-22 MED ORDER — DILTIAZEM HCL-DEXTROSE 125-5 MG/125ML-% IV SOLN (PREMIX)
INTRAVENOUS | Status: AC
  Administered 2022-05-22: 5 mg/h via INTRAVENOUS
  Filled 2022-05-22: qty 125

## 2022-05-22 MED ORDER — MESALAMINE 1.2 G PO TBEC
4.8000 g | DELAYED_RELEASE_TABLET | Freq: Every day | ORAL | Status: DC
  Administered 2022-05-23 – 2022-05-28 (×6): 4.8 g via ORAL
  Filled 2022-05-22 (×7): qty 4

## 2022-05-22 MED ORDER — HYDROCODONE-ACETAMINOPHEN 5-325 MG PO TABS
1.0000 | ORAL_TABLET | ORAL | Status: DC | PRN
  Administered 2022-05-23 – 2022-05-27 (×2): 1 via ORAL
  Administered 2022-05-27: 2 via ORAL
  Filled 2022-05-22 (×2): qty 1
  Filled 2022-05-22: qty 2

## 2022-05-22 MED ORDER — SODIUM CHLORIDE 0.9 % IV BOLUS
500.0000 mL | Freq: Once | INTRAVENOUS | Status: AC
  Administered 2022-05-22: 500 mL via INTRAVENOUS

## 2022-05-22 MED ORDER — MOMETASONE FURO-FORMOTEROL FUM 200-5 MCG/ACT IN AERO
2.0000 | INHALATION_SPRAY | Freq: Two times a day (BID) | RESPIRATORY_TRACT | Status: DC
  Administered 2022-05-23 – 2022-05-28 (×10): 2 via RESPIRATORY_TRACT
  Filled 2022-05-22: qty 8.8

## 2022-05-22 MED ORDER — LACTATED RINGERS IV SOLN: 150.0000 mL/h | INTRAVENOUS | Status: DC

## 2022-05-22 MED ORDER — ALBUTEROL SULFATE (2.5 MG/3ML) 0.083% IN NEBU: 2.5000 mg | INHALATION_SOLUTION | Freq: Four times a day (QID) | RESPIRATORY_TRACT | Status: DC | PRN

## 2022-05-22 MED ORDER — ONDANSETRON HCL 4 MG/2ML IJ SOLN
4.0000 mg | Freq: Four times a day (QID) | INTRAMUSCULAR | Status: DC | PRN
  Administered 2022-05-23: 4 mg via INTRAVENOUS
  Filled 2022-05-22: qty 2

## 2022-05-22 MED ORDER — FLECAINIDE ACETATE 50 MG PO TABS
75.0000 mg | ORAL_TABLET | Freq: Two times a day (BID) | ORAL | Status: DC
  Administered 2022-05-23 – 2022-05-28 (×11): 75 mg via ORAL
  Filled 2022-05-22 (×11): qty 2

## 2022-05-22 MED ORDER — LACTATED RINGERS IV SOLN: INTRAVENOUS | Status: AC

## 2022-05-22 MED ORDER — ACETAMINOPHEN 650 MG RE SUPP: 650.0000 mg | Freq: Four times a day (QID) | RECTAL | Status: DC | PRN

## 2022-05-22 MED ORDER — LACTATED RINGERS IV SOLN: INTRAVENOUS | Status: DC

## 2022-05-22 MED ORDER — METRONIDAZOLE 500 MG/100ML IV SOLN
500.0000 mg | Freq: Once | INTRAVENOUS | Status: AC
  Administered 2022-05-22: 500 mg via INTRAVENOUS
  Filled 2022-05-22: qty 100

## 2022-05-22 MED ORDER — PHENYLEPHRINE HCL-NACL 20-0.9 MG/250ML-% IV SOLN: 0.0000 ug/min | INTRAVENOUS | Status: DC

## 2022-05-22 MED ORDER — METOPROLOL SUCCINATE ER 25 MG PO TB24
12.5000 mg | ORAL_TABLET | Freq: Every day | ORAL | Status: DC
  Administered 2022-05-23 – 2022-05-27 (×5): 12.5 mg via ORAL
  Filled 2022-05-22 (×5): qty 1

## 2022-05-22 MED ORDER — SODIUM CHLORIDE 0.9 % IV SOLN
2.0000 g | Freq: Three times a day (TID) | INTRAVENOUS | Status: DC
  Administered 2022-05-23 (×2): 2 g via INTRAVENOUS
  Filled 2022-05-22 (×2): qty 2

## 2022-05-22 MED ORDER — FLECAINIDE ACETATE 50 MG PO TABS
75.0000 mg | ORAL_TABLET | Freq: Once | ORAL | Status: AC
  Administered 2022-05-22: 75 mg via ORAL
  Filled 2022-05-22: qty 2

## 2022-05-22 MED ORDER — VANCOMYCIN HCL IN DEXTROSE 1-5 GM/200ML-% IV SOLN
1000.0000 mg | Freq: Once | INTRAVENOUS | Status: AC
  Administered 2022-05-22: 1000 mg via INTRAVENOUS
  Filled 2022-05-22: qty 200

## 2022-05-22 MED ORDER — SODIUM CHLORIDE 0.9 % IV BOLUS
1000.0000 mL | Freq: Once | INTRAVENOUS | Status: AC
  Administered 2022-05-22: 1000 mL via INTRAVENOUS

## 2022-05-22 MED ORDER — DILTIAZEM HCL-DEXTROSE 125-5 MG/125ML-% IV SOLN (PREMIX)
5.0000 mg/h | INTRAVENOUS | Status: DC
  Administered 2022-05-24: 2.5 mg/h via INTRAVENOUS
  Filled 2022-05-22: qty 125

## 2022-05-22 MED ORDER — SODIUM CHLORIDE 0.9 % IV SOLN
2.0000 g | Freq: Once | INTRAVENOUS | Status: AC
  Administered 2022-05-22: 2 g via INTRAVENOUS
  Filled 2022-05-22: qty 12.5

## 2022-05-22 MED ORDER — DILTIAZEM LOAD VIA INFUSION
10.0000 mg | Freq: Once | INTRAVENOUS | Status: AC
  Administered 2022-05-22: 10 mg via INTRAVENOUS
  Filled 2022-05-22: qty 10

## 2022-05-22 MED ORDER — FLECAINIDE ACETATE 50 MG PO TABS
75.0000 mg | ORAL_TABLET | Freq: Two times a day (BID) | ORAL | Status: DC
  Filled 2022-05-22: qty 2

## 2022-05-22 MED ORDER — ROSUVASTATIN CALCIUM 10 MG PO TABS
10.0000 mg | ORAL_TABLET | Freq: Every day | ORAL | Status: DC
  Administered 2022-05-22 – 2022-05-28 (×7): 10 mg via ORAL
  Filled 2022-05-22 (×7): qty 1

## 2022-05-22 MED ORDER — ACETAMINOPHEN 500 MG PO TABS
1000.0000 mg | ORAL_TABLET | Freq: Once | ORAL | Status: AC
  Administered 2022-05-22: 1000 mg via ORAL
  Filled 2022-05-22: qty 2

## 2022-05-22 MED ORDER — TAMSULOSIN HCL 0.4 MG PO CAPS
0.4000 mg | ORAL_CAPSULE | Freq: Every day | ORAL | Status: DC
  Administered 2022-05-23 – 2022-05-28 (×6): 0.4 mg via ORAL
  Filled 2022-05-22 (×6): qty 1

## 2022-05-22 MED ORDER — RIVAROXABAN 20 MG PO TABS
20.0000 mg | ORAL_TABLET | Freq: Once | ORAL | Status: AC
  Administered 2022-05-22: 20 mg via ORAL
  Filled 2022-05-22: qty 1

## 2022-05-22 NOTE — Telephone Encounter (Signed)
Dry heaves tight chest weak struggling with breathing has barely eaten since Monday morning

## 2022-05-22 NOTE — Assessment & Plan Note (Signed)
History of urinary retention November 2023 Continue Flomax

## 2022-05-22 NOTE — ED Triage Notes (Signed)
Pt to ED for shob, COPD, and chest pain since Tuesday. Has been wearing 2 L [place] intermittent since Tuesday.  Pt with labored breathing noted

## 2022-05-22 NOTE — Assessment & Plan Note (Signed)
Nocturnal hypoxemia Patient is followed by pulmonology Continue home inhalers pending med rec DuoNebs as needed Continue supplemental oxygen

## 2022-05-22 NOTE — Consult Note (Signed)
Pharmacy Antibiotic Note  Antonio [name] is a 77 y.o. male admitted on 05/22/2022 with cough and shortness of breath.  Pharmacy has been consulted for cefepime dosing for PNA   Plan: Cefepme 2 gram Q8H  Weight: 66.7 kg (147 lb)  Temp (24hrs), Avg:101.2 F (38.4 C), Min:99.9 F (37.7 C), Max:102.5 F (39.2 C)  Recent Labs  Lab 05/22/22 1630 05/22/22 1643 05/22/22 1909  WBC 13.2*  --   --   CREATININE 0.88  --   --   LATICACIDVEN  --  2.7* 1.2    Estimated Creatinine Clearance: 64.2 mL/min (by C-G formula based on SCr of 0.88 mg/dL).    Allergies  Allergen Reactions   Penicillins Rash    Has patient had a PCN reaction causing immediate rash, facial/tongue/throat swelling, SOB or lightheadedness with hypotension:Yes Has patient had a PCN reaction causing severe rash involving mucus membranes or skin necrosis:Yes Has patient had a PCN reaction that required hospitalization:No Has patient had a PCN reaction occurring within the last 10 years:No If all of the above answers are "NO", then may proceed with Cephalosporin use.     Antimicrobials this admission: 4/11 metronidazole x 1 4/11 vancomycin x 1  4/11 cefepime  >>  4/11 azithromycin >>  Dose adjustments this admission:   Microbiology results: 4/11 BCx: sent   Thank you for allowing pharmacy to be a part of this patient's care.  Sharen Hones, PharmD, BCPS Clinical Pharmacist   05/22/2022 9:28 PM

## 2022-05-22 NOTE — Assessment & Plan Note (Addendum)
No acute issues Continue mesalamine

## 2022-05-22 NOTE — Assessment & Plan Note (Addendum)
Sepsis Sepsis criteria include fever, tachycardia and tachypnea Continue sepsis fluids Continue cefepime and azithromycin Antitussives, flutter valve, incentive spirometer Supplemental oxygen as needed

## 2022-05-22 NOTE — ED Notes (Signed)
Dr Erma Heritage in with pt and family.

## 2022-05-22 NOTE — Telephone Encounter (Signed)
Called and spoke to patient's spouse, Cindy(DPR). Arline Asp stated that patient has been experiencing increased SOB with exertion and occ at rest, chest tightness, increased fatigued and prod cough with white sputum x3d. He feels that he has a fever but he temp was 98 <92min ago.  Dry heaves yesterday and loose bowels today. He has not been able to eat in 3 days. He was able to eat half of a peanut butter sandwich today.  He wears 2L cont. Spo2 maintaining around 96% He is using Symbicort BID, mucinex daily and PRN albuterol.   Spoke to Dr. Jayme Cloud verbally, recommend ED for further evaluation. Patient is aware and voiced his understanding.  Nothing further needed.

## 2022-05-22 NOTE — Assessment & Plan Note (Signed)
Likely secondary to pneumonia with sepsis Continue diltiazem infusion and transition to oral per protocol Continue flecainide Continue Xarelto

## 2022-05-22 NOTE — H&P (Signed)
History and Physical    Patient: Antonio [name] [id number] DOB: May 21, 1945 DOA: 05/22/2022 DOS: the patient was seen and examined on 05/22/2022 PCP: Antonio [name]  Patient coming from: Home  Chief Complaint:  Chief Complaint  Patient presents with   Shortness of Breath   Chest Pain    HPI: Antonio [name] is a 77 y.o. male with medical history significant for Class 3 severe COPD with nocturnal hypoxemia at 2L/min, paroxysmal atrial fibrillation/flutter on xarelto and flecainide, not a candidate for invasive procedures due to severe lung disease SVT, PAD s/p left SFA stent, HTN, ulcerative colitis, BPH with history of acute urinary retention requiring Foley who presents to the ED with a 1 week history of cough and shortness of breath.  As the days progressed his shortness of breath became progressively worse and he developed mild chest pressure associated with coughing and fatigue, but poor appetite, generalized malaise. ED course and data review:On arrival BP 146/110 with heart rate in the 180s and respirations up to 34.  Tmax 102.5 EKG, independently viewed and interpreted showed rapid A-fib at 185 with no acute ST-T wave changes Labs significant for WBC of 13,000 with lactic acid 2.7>1.2.  Troponin 15>34.  Respiratory viral panel negative.  Mild hyponatremia of 129, bicarb 19 Chest x-ray reveals patchy airspace disease in the medial right base concerning for pneumonia Patient was given his home flecainide dose and started on a diltiazem infusion with prove [place] rate to the 1 teens. Patient was started on sepsis fluids as well as Vanco, metronidazole and cefepime Hospitalist consulted for admission.   Review of Systems: As mentioned in the history of present illness. All other systems reviewed and are negative.  Past Medical History:  Diagnosis Date   Allergy    Calf pain    COPD (chronic obstructive pulmonary disease) (HCC)    no Oxygen per pt   COVID-19     Dyspnea    Hypokalemia    Internal hemorrhoids    Internal hemorrhoids    Muscle cramp    Muscle pain    PAD (peripheral artery disease) (HCC)    Paroxysmal atrial fibrillation (HCC) 08/2019   Pneumonia    PONV (postoperative nausea and vomiting)    Tubular adenoma of colon    Ulcerative colitis (HCC)    Past Surgical History:  Procedure Laterality Date   ANTERIOR CERVICAL DECOMP/DISCECTOMY FUSION N/A 05/05/2016   Procedure: ANTERIOR CERVICAL DECOMPRESSION FUSION CERVICAL THREE-FOUR.;  Surgeon: Antonio Sicks, [name];  Location: [facility] OR;  Service: Neurosurgery;  Laterality: N/A;  right side approach   CATARACT EXTRACTION Bilateral    CERVICAL DISCECTOMY  04/2016   C3-C4   COLONOSCOPY     COLONOSCOPY  08/09/2015   HEMORRHOID SURGERY  1988   leg stent Left approx 5-6 yrs ago   2 to left leg   MENISCUS REPAIR Left 12/2013   MOHS SURGERY     POLYPECTOMY     ROTATOR CUFF REPAIR Left 2007   TONSILLECTOMY AND ADENOIDECTOMY  1957   Social History:  reports that he quit smoking about 10 years ago. His smoking use included cigarettes and cigars. He has a 17.50 pack-year smoking history. He has never used smokeless tobacco. He reports current alcohol use of about 6.0 standard drinks of alcohol per week. He reports that he does not use drugs.  Allergies  Allergen Reactions   Penicillins Rash    Has patient had a PCN reaction causing immediate rash, facial/tongue/throat swelling, SOB  or lightheadedness with hypotension:Yes Has patient had a PCN reaction causing severe rash involving mucus membranes or skin necrosis:Yes Has patient had a PCN reaction that required hospitalization:No Has patient had a PCN reaction occurring within the last 10 years:No If all of the above answers are "NO", then may proceed with Cephalosporin use.     Family History  Problem Relation Age of Onset   Cancer Mother        leukemia   Colon polyps Mother    Heart disease Father    Heart attack Father [id number]   Healthy  Daughter    Healthy Son    Colon cancer Neg Hx    Esophageal cancer Neg Hx    Rectal cancer Neg Hx    Stomach cancer Neg Hx     Prior to Admission medications   Medication Sig Start Date [name] Date Taking? Authorizing Provider  albuterol (PROVENTIL) (2.5 MG/3ML) 0.083% nebulizer solution Take 3 mLs (2.5 mg total) by nebulization every 6 (six) hours as needed for wheezing or shortness of breath. 04/22/21  Yes Antonio [name]  albuterol (VENTOLIN HFA) 108 (90 Base) MCG/ACT inhaler Inhale 2 puffs into the lungs every 6 (six) hours as needed for wheezing or shortness of breath. 01/23/22  Yes Antonio [name]  Azelastine HCl 137 MCG/SPRAY SOLN PLACE 1 SPRAY INTO BOTH NOSTRILS TWICE DAILY Patient taking differently: Place 1 spray into both nostrils 2 (two) times daily. 05/12/22  Yes Antonio [name]  famotidine (PEPCID) 20 MG tablet Take 1 tablet (20 mg total) by mouth 2 (two) times daily as needed for heartburn or indigestion. 12/19/21  Yes Antonio [name]  flecainide (TAMBOCOR) 50 MG tablet Take 1.5 tablets (75 mg total) by mouth 2 (two) times daily. 09/04/21  Yes Antonio [name]  furosemide (LASIX) 20 MG tablet TAKE 1 TABLET BY MOUTH DAILY AS NEEDED (FOR SWELLING OR WEIGHT GAIN OF 3 LBS OVERNIGHT OR 5 LBS IN A WEEK). Patient taking differently: Take 20 mg by mouth daily as needed for fluid or edema. 04/07/22  Yes Antonio [name]  ipratropium (ATROVENT) 0.02 % nebulizer solution Take 2.5 mLs (0.5 mg total) by nebulization 4 (four) times daily. 06/05/20  Yes Antonio [name]  mesalamine (LIALDA) 1.2 g EC tablet TAKE 4 TABLETS BY MOUTH DAILY WITH BREAKFAST Patient taking differently: Take 4.8 g by mouth daily with breakfast. TAKE 4 TABLETS BY MOUTH DAILY WITH BREAKFAST 10/03/20  Yes Antonio [name]  metoprolol succinate (TOPROL-XL) 25 MG 24 hr tablet Take 0.5 tablets (12.5 mg total) by mouth daily. 03/21/22  Yes Antonio [name]  rivaroxaban (XARELTO) 20 MG TABS  tablet Take 1 tablet (20 mg total) by mouth daily with supper. 09/13/21  Yes Antonio [name]  rosuvastatin (CRESTOR) 10 MG tablet Take 1 tablet (10 mg total) by mouth daily. 09/04/21  Yes Antonio [name]  SYMBICORT 160-4.5 MCG/ACT inhaler Inhale 2 puffs into the lungs 2 (two) times daily. 12/30/21  Yes Antonio [name]  tamsulosin (FLOMAX) 0.4 MG CAPS capsule Take 1 capsule (0.4 mg total) by mouth daily. 01/23/22  Yes Vaillancourt, Samantha, PA-C  dupilumab (DUPIXENT) 300 MG/2ML prefilled syringe Inject 300 mg into the skin every 14 (fourteen) days. 11/27/21   Antonio [name]  hydrocortisone 2.5 % cream Apply 1 application. topically as needed. Patient not taking: Reported on 05/22/2022 07/05/19   Provider, Historical, [name]  ondansetron (ZOFRAN-ODT) 4 MG disintegrating tablet Take 1 tablet (  4 mg total) by mouth every 8 (eight) hours as needed for nausea or vomiting. Patient not taking: Reported on 05/22/2022 12/13/21   Chesley Noon, [name]    Physical Exam: Vitals:   05/22/22 1845 05/22/22 1915 05/22/22 1930 05/22/22 2000  BP: 98/64 (!) 101/57 102/81 116/65  Pulse: (!) 101 (!) 112 (!) 111 (!) 110  Resp: 19 (!) 22 (!) 22 20  Temp:      TempSrc:      SpO2: 96% 95% 93% 95%  Weight:       Physical Exam Vitals and nursing note reviewed.  Constitutional:      General: He is not in acute distress. HENT:     Head: Normocephalic and atraumatic.  Cardiovascular:     Rate and Rhythm: Regular rhythm. Tachycardia present.     Heart sounds: Normal heart sounds.  Pulmonary:     Effort: Tachypnea present.     Breath sounds: Wheezing present.  Abdominal:     Palpations: Abdomen is soft.     Tenderness: There is no abdominal tenderness.  Neurological:     Mental Status: Mental status is at baseline.     Labs on Admission: I have personally reviewed following labs and imaging studies  CBC: Recent Labs  Lab 05/22/22 1630  WBC 13.2*  HGB 15.1  HCT 44.5  MCV 90.6  PLT 188    Basic Metabolic Panel: Recent Labs  Lab 05/22/22 1630  NA 129*  K 3.7  CL 97*  CO2 19*  GLUCOSE 116*  BUN 13  CREATININE 0.88  CALCIUM 8.8*   GFR: Estimated Creatinine Clearance: 64.2 mL/min (by C-G formula based on SCr of 0.88 mg/dL). Liver Function Tests: No results for input(s): "AST", "ALT", "ALKPHOS", "BILITOT", "PROT", "ALBUMIN" in the last 168 hours. No results for input(s): "LIPASE", "AMYLASE" in the last 168 hours. No results for input(s): "AMMONIA" in the last 168 hours. Coagulation Profile: Recent Labs  Lab 05/22/22 1643  INR 1.5*   Cardiac Enzymes: No results for input(s): "CKTOTAL", "CKMB", "CKMBINDEX", "TROPONINI" in the last 168 hours. BNP (last 3 results) No results for input(s): "PROBNP" in the last 8760 hours. HbA1C: No results for input(s): "HGBA1C" in the last 72 hours. CBG: No results for input(s): "GLUCAP" in the last 168 hours. Lipid Profile: No results for input(s): "CHOL", "HDL", "LDLCALC", "TRIG", "CHOLHDL", "LDLDIRECT" in the last 72 hours. Thyroid Function Tests: No results for input(s): "TSH", "T4TOTAL", "FREET4", "T3FREE", "THYROIDAB" in the last 72 hours. Anemia Panel: No results for input(s): "VITAMINB12", "FOLATE", "FERRITIN", "TIBC", "IRON", "RETICCTPCT" in the last 72 hours. Urine analysis:    Component Value Date/Time   COLORURINE YELLOW (A) 12/20/2021 1910   APPEARANCEUR CLEAR (A) 12/20/2021 1910   LABSPEC 1.029 12/20/2021 1910   PHURINE 5.0 12/20/2021 1910   GLUCOSEU NEGATIVE 12/20/2021 1910   HGBUR NEGATIVE 12/20/2021 1910   BILIRUBINUR NEGATIVE 12/20/2021 1910   BILIRUBINUR negative 12/14/2019 0949   KETONESUR NEGATIVE 12/20/2021 1910   PROTEINUR NEGATIVE 12/20/2021 1910   UROBILINOGEN 0.2 12/14/2019 0949   NITRITE NEGATIVE 12/20/2021 1910   LEUKOCYTESUR NEGATIVE 12/20/2021 1910    Radiological Exams on Admission: DG Chest Port 1 View  Result Date: 05/22/2022 CLINICAL DATA:  Shortness of breath. EXAM: PORTABLE  CHEST 1 VIEW COMPARISON:  12/16/2021 FINDINGS: No evidence for pulmonary edema or pneumothorax. Extreme left costophrenic sulcus has not been included on the film. Streaky opacity in the lung bases suggest atelectasis with superimposed patchy airspace disease in the medial right base concerning for  pneumonia. The cardiopericardial silhouette is within normal limits for size. Telemetry leads overlie the chest. Defibrillator pads overlie the chest. IMPRESSION: Patchy airspace disease in the medial right base concerning for pneumonia. Electronically Signed   By: Kennith Center M.D.   On: 05/22/2022 16:58     Data Reviewed: Relevant notes from primary care and specialist visits, past discharge summaries as available in EHR, including Care Everywhere. Prior diagnostic testing as pertinent to current admission diagnoses Updated medications and problem lists for reconciliation ED course, including vitals, labs, imaging, treatment and response to treatment Triage notes, nursing and pharmacy notes and ED provider's notes Notable results as noted in HPI   Assessment and Plan: * Atrial fibrillation with rapid ventricular response Likely secondary to pneumonia with sepsis Continue diltiazem infusion and transition to oral per protocol Continue flecainide Continue Xarelto   CAP (community acquired pneumonia) Sepsis Sepsis criteria include fever, tachycardia and tachypnea Continue sepsis fluids Continue cefepime and azithromycin Antitussives, flutter valve, incentive spirometer Supplemental oxygen as needed  Stage 3 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 Nocturnal hypoxemia Patient is followed by pulmonology Continue home inhalers pending med rec DuoNebs as needed Continue supplemental oxygen  BPH (benign prostatic hyperplasia) History of urinary retention November 2023 Continue Flomax  PAD ss/p SFA stent(peripheral artery disease) Continue Xarelto and rosuvastatin  Ulcerative colitis No  acute issues Continue mesalamine     DVT prophylaxis: Xarelto  Consults: none  Advance Care Planning:   Code Status: Prior   Family Communication: none  Disposition Plan: Back to previous home environment  Severity of Illness: The appropriate patient status for this patient is INPATIENT. Inpatient status is judged to be reasonable and necessary in order to provide the required intensity of service to ensure the patient's safety. The patient's presenting symptoms, physical exam findings, and initial radiographic and laboratory data in the context of their chronic comorbidities is felt to place them at high risk for further clinical deterioration. Furthermore, it is not anticipated that the patient will be medically stable for discharge from the hospital within 2 midnights of admission.   * I certify that at the point of admission it is my clinical judgment that the patient will require inpatient hospital care spanning beyond 2 midnights from the point of admission due to high intensity of service, high risk for further deterioration and high frequency of surveillance required.*  Author: Andris Baumann, [name] 05/22/2022 8:37 PM  For on call review [email or web address].

## 2022-05-22 NOTE — Progress Notes (Signed)
PHARMACY -  BRIEF ANTIBIOTIC NOTE   Pharmacy has received consults for vancomycin and cefepime from an ED provider.  The patient's profile has been reviewed for ht/wt/allergies/indication/available labs.    One time orders placed for vancomycin 1,000 mg x 1 and cefepime 2 grams x 1  Further antibiotics/pharmacy consults should be ordered by admitting physician if indicated.                       Thank you,  Elliot Gurney, PharmD, BCPS Clinical Pharmacist  05/22/2022 4:44 PM

## 2022-05-22 NOTE — ED Notes (Signed)
Pt to be transported with monitor to room. Stable at time of departure. Diltiazem drip going at 2.39mL/hr per VO of MD.

## 2022-05-22 NOTE — Hospital Course (Addendum)
15  34  16    

## 2022-05-22 NOTE — Assessment & Plan Note (Signed)
Continue Xarelto and rosuvastatin

## 2022-05-22 NOTE — ED Provider Notes (Signed)
[facility] Provider Note    Event Date/Time   First MD Initiated Contact with Patient 05/22/22 1640     (approximate)   History   Shortness of Breath and Chest Pain   HPI  Antonio [name] is a 77 y.o. male with PMhx COPD, HTN, ulcerative colitis, pAFib, here with generalized weakness, SOB. Pt reports that for the past week or so he has had progressive SOB, mild chest pressure with coughing, and fatigue. He has had poor appetite. He became more SOB today and had difficulty even getting around the house, so he came to the ED. Reports he's felt his heart racing but no persistent CP today. No recent med changes. No known sick contacts.      Physical Exam   Triage Vital Signs: ED Triage Vitals  Enc Vitals Group     BP 05/22/22 1622 (!) 146/110     Pulse Rate 05/22/22 1625 (!) 185     Resp 05/22/22 1622 (!) 24     Temp 05/22/22 1633 (!) 102.5 F (39.2 C)     Temp Source 05/22/22 1633 Oral     SpO2 05/22/22 1625 91 %     Weight 05/22/22 1622 147 lb (66.7 kg)     Height --      Head Circumference --      Peak Flow --      Pain Score 05/22/22 1621 4     Pain Loc --      Pain Edu? --      Excl. in GC? --     Most recent vital signs: Vitals:   05/22/22 1930 05/22/22 2000  BP: 102/81 116/65  Pulse: (!) 111 (!) 110  Resp: (!) 22 20  Temp:    SpO2: 93% 95%     General: Awake, no distress.  CV:  Good peripheral perfusion. Tachycardic, irregularly-irregular. Resp:  Moderate tachypnea with bilateral ronchi. Abd:  No distention. No tenderness. Other:  No LE edema. Mildly dry MM.   ED Results / Procedures / Treatments   Labs (all labs ordered are listed, but only abnormal results are displayed) Labs Reviewed  BASIC METABOLIC PANEL - Abnormal; Notable for the following components:      Result Value   Sodium 129 (*)    Chloride 97 (*)    CO2 19 (*)    Glucose, Bld 116 (*)    Calcium 8.8 (*)    All other components within normal  limits  CBC - Abnormal; Notable for the following components:   WBC 13.2 (*)    All other components within normal limits  LACTIC ACID, PLASMA - Abnormal; Notable for the following components:   Lactic Acid, Venous 2.7 (*)    All other components within normal limits  PROTIME-INR - Abnormal; Notable for the following components:   Prothrombin Time 17.5 (*)    INR 1.5 (*)    All other components within normal limits  APTT - Abnormal; Notable for the following components:   aPTT 41 (*)    All other components within normal limits  TROPONIN I (HIGH SENSITIVITY) - Abnormal; Notable for the following components:   Troponin I (High Sensitivity) 34 (*)    All other components within normal limits  RESP PANEL BY RT-PCR (RSV, FLU A&B, COVID)  [id number]  CULTURE, BLOOD (ROUTINE X 2)  CULTURE, BLOOD (ROUTINE X 2)  LACTIC ACID, PLASMA  URINALYSIS, COMPLETE (UACMP) WITH MICROSCOPIC  TROPONIN I (HIGH SENSITIVITY)  EKG AFib with RVR, VR 173. Non specific ST depression in lateral precordial leads, likely demand related. No ST elevations.   RADIOLOGY CXR: Patchy airspace disease in medial R base   I also independently reviewed and agree with radiologist interpretations.   PROCEDURES:  Critical Care performed: Yes, see critical care procedure note(s)  .Critical Care  Performed by: Shaune PollackIsaacs, Rendell Thivierge, MD Authorized by: Shaune PollackIsaacs, Adison Reifsteck, MD   Critical care provider statement:    Critical care time (minutes):  30   Critical care time was exclusive of:  Separately billable procedures and treating other patients   Critical care was necessary to treat or prevent imminent or life-threatening deterioration of the following conditions:  Cardiac failure, respiratory failure, sepsis and circulatory failure   Critical care was time spent personally by me on the following activities:  Development of treatment plan with patient or surrogate, discussions with consultants, evaluation of patient's response  to treatment, examination of patient, ordering and review of laboratory studies, ordering and review of radiographic studies, ordering and performing treatments and interventions, pulse oximetry, re-evaluation of patient's condition and review of old charts     MEDICATIONS ORDERED IN ED: Medications  diltiazem (CARDIZEM) 1 mg/mL load via infusion 10 mg (10 mg Intravenous Bolus from Bag 05/22/22 1646)    And  diltiazem (CARDIZEM) 125 mg in dextrose 5% 125 mL (1 mg/mL) infusion (2.5 mg/hr Intravenous Rate/Dose Change 05/22/22 1827)  lactated ringers infusion (has no administration in time range)  lactated ringers infusion (has no administration in time range)  acetaminophen (TYLENOL) tablet 1,000 mg (1,000 mg Oral Given 05/22/22 1647)  sodium chloride 0.9 % bolus 1,000 mL (0 mLs Intravenous Stopped 05/22/22 1743)  ceFEPIme (MAXIPIME) 2 g in sodium chloride 0.9 % 100 mL IVPB (0 g Intravenous Stopped 05/22/22 1736)  metroNIDAZOLE (FLAGYL) IVPB 500 mg (500 mg Intravenous New Bag/Given 05/22/22 1911)  vancomycin (VANCOCIN) IVPB 1000 mg/200 mL premix (0 mg Intravenous Stopped 05/22/22 1856)  sodium chloride 0.9 % bolus 1,000 mL (0 mLs Intravenous Stopped 05/22/22 1827)  flecainide (TAMBOCOR) tablet 75 mg (75 mg Oral Given 05/22/22 1828)  rivaroxaban (XARELTO) tablet 20 mg (20 mg Oral Given 05/22/22 1828)  sodium chloride 0.9 % bolus 500 mL (0 mLs Intravenous Stopped 05/22/22 1856)     IMPRESSION / MDM / ASSESSMENT AND PLAN / ED COURSE  I reviewed the triage vital signs and the nursing notes.                              Differential diagnosis includes, but is not limited to, PNA, sepsis, AFib RVR, ACS, CHF, anemia  Patient's presentation is most consistent with acute presentation with potential threat to life or bodily function.  The patient is on the cardiac monitor to evaluate for evidence of arrhythmia and/or significant heart rate changes   77 yo M with h/o AFib, HTN, PAD, UC, here with cough,  fever, fatigue. Pt arrived in AFib RVR with significant tachycardia to 170s. Febrile as well. For fever, pt given IV ABX, tylenol, and 30 cc/kg fluid resuscitation. For his AFib RVR, started on dilt drip and given his PO flecainide. Pt had some transient borderline hypotension but has responded well to fluids and better rate control.   Labs show leukocytosis, moderate lactic acidosis. Labs are otherwise reassuring. Will admit to medicine.   FINAL CLINICAL IMPRESSION(S) / ED DIAGNOSES   Final diagnoses:  Sepsis due to pneumonia  Atrial fibrillation with  rapid ventricular response     Rx / DC Orders   ED Discharge Orders     None        Note:  This document was prepared using Dragon voice recognition software and may include unintentional dictation errors.   Shaune Pollack, MD 05/22/22 2016

## 2022-05-22 NOTE — Progress Notes (Signed)
CODE SEPSIS - PHARMACY COMMUNICATION  **Broad Spectrum Antibiotics should be administered within 1 hour of Sepsis diagnosis**  Time Code Sepsis Called/Page Received: 1647  Antibiotics Ordered: vancomycin 1,000mg  x 1 and cefepime 2 grams x 1  Time of 1st antibiotic administration: 1652  Additional action taken by pharmacy: none  If necessary, Name of Provider/Nurse Contacted: n/a    Elliot Gurney, PharmD, BCPS Clinical Pharmacist  05/22/2022 4:45 PM

## 2022-05-22 NOTE — Sepsis Progress Note (Signed)
Elink following code sepsis °

## 2022-05-23 DIAGNOSIS — I4891 Unspecified atrial fibrillation: Secondary | ICD-10-CM | POA: Diagnosis not present

## 2022-05-23 LAB — URINALYSIS, COMPLETE (UACMP) WITH MICROSCOPIC
Bacteria, UA: NONE SEEN
Bilirubin Urine: NEGATIVE
Glucose, UA: NEGATIVE mg/dL
Ketones, ur: 5 mg/dL — AB
Leukocytes,Ua: NEGATIVE
Nitrite: NEGATIVE
Protein, ur: 30 mg/dL — AB
Specific Gravity, Urine: 1.014 (ref 1.005–1.030)
Squamous Epithelial / HPF: NONE SEEN /HPF (ref 0–5)
pH: 5 (ref 5.0–8.0)

## 2022-05-23 LAB — BASIC METABOLIC PANEL
Anion gap: 5 (ref 5–15)
BUN: 11 mg/dL (ref 8–23)
CO2: 23 mmol/L (ref 22–32)
Calcium: 7.8 mg/dL — ABNORMAL LOW (ref 8.9–10.3)
Chloride: 106 mmol/L (ref 98–111)
Creatinine, Ser: 0.72 mg/dL (ref 0.61–1.24)
GFR, Estimated: >60 mL/min (ref >60)
Glucose, Bld: 104 mg/dL — ABNORMAL HIGH (ref 70–99)
Potassium: 3.6 mmol/L (ref 3.5–5.1)
Sodium: 134 mmol/L — ABNORMAL LOW (ref 135–145)

## 2022-05-23 LAB — PROTIME-INR
INR: 2.3 — ABNORMAL HIGH (ref 0.8–1.2)
Prothrombin Time: 24.7 seconds — ABNORMAL HIGH (ref 11.4–15.2)

## 2022-05-23 LAB — CBC
HCT: 34.1 % — ABNORMAL LOW (ref 39.0–52.0)
Hemoglobin: 11.5 g/dL — ABNORMAL LOW (ref 13.0–17.0)
MCH: 31 pg (ref 26.0–34.0)
MCHC: 33.7 g/dL (ref 30.0–36.0)
MCV: 91.9 fL (ref 80.0–100.0)
Platelets: 155 10*3/uL (ref 150–400)
RBC: 3.71 MIL/uL — ABNORMAL LOW (ref 4.22–5.81)
RDW: 11.7 % (ref 11.5–15.5)
WBC: 8.5 10*3/uL (ref 4.0–10.5)
nRBC: 0 % (ref 0.0–0.2)

## 2022-05-23 LAB — CORTISOL-AM, BLOOD: Cortisol - AM: 22.9 ug/dL — ABNORMAL HIGH (ref 6.7–22.6)

## 2022-05-23 LAB — PROCALCITONIN: Procalcitonin: 0.65 ng/mL

## 2022-05-23 MED ORDER — ARFORMOTEROL TARTRATE 15 MCG/2ML IN NEBU: 15.0000 ug | INHALATION_SOLUTION | Freq: Two times a day (BID) | RESPIRATORY_TRACT | Status: DC

## 2022-05-23 MED ORDER — REVEFENACIN 175 MCG/3ML IN SOLN
175.0000 ug | Freq: Every day | RESPIRATORY_TRACT | Status: DC
  Administered 2022-05-24 – 2022-05-28 (×5): 175 ug via RESPIRATORY_TRACT
  Filled 2022-05-23 (×6): qty 3

## 2022-05-23 MED ORDER — SACCHAROMYCES BOULARDII 250 MG PO CAPS
250.0000 mg | ORAL_CAPSULE | Freq: Two times a day (BID) | ORAL | Status: DC
  Administered 2022-05-24 – 2022-05-28 (×8): 250 mg via ORAL
  Filled 2022-05-23 (×10): qty 1

## 2022-05-23 MED ORDER — SODIUM CHLORIDE 0.9 % IV SOLN
2.0000 g | INTRAVENOUS | Status: AC
  Administered 2022-05-23 – 2022-05-26 (×4): 2 g via INTRAVENOUS
  Filled 2022-05-23: qty 20
  Filled 2022-05-23: qty 2
  Filled 2022-05-23 (×2): qty 20

## 2022-05-23 MED ORDER — TRAZODONE HCL 50 MG PO TABS
25.0000 mg | ORAL_TABLET | Freq: Every day | ORAL | Status: AC
  Administered 2022-05-23: 25 mg via ORAL
  Filled 2022-05-23: qty 1

## 2022-05-23 MED ORDER — BUDESONIDE 0.25 MG/2ML IN SUSP: 0.2500 mg | Freq: Two times a day (BID) | RESPIRATORY_TRACT | Status: DC

## 2022-05-23 NOTE — Care Plan (Signed)
Courtesy visit requested by patient and hospitalist team.  Patient called the office yesterday with significant respiratory distress and anorexia for 3 days.  Instructed to come to the ED.  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  Patient was noted to be in acute on chronic respiratory failure, atrial fibrillation with rapid ventricular response and had evidence of pneumonia.  Patient is currently being treated for these issues.  He appears to be more comfortable today according to his wife who was present when I visited.  Of note he was having difficulty using his Dulera inhaler due to inability to breath-hold well.  I would recommend doing nebulization treatments while in the hospital: Brovana 15 mcg twice a day and Pulmicort 0.5 mg twice daily.  Yupelri once daily all via nebulizer.  Use albuterol as needed.    He can then resume his regular regimen once he returns home.  Thank you for letting me know of the patient's admission.  Gailen Shelter, MD Advanced Bronchoscopy PCCM [facility]

## 2022-05-23 NOTE — TOC Progression Note (Signed)
Transition of Care [facility]) - Progression Note    Patient Details  Name: Antonio [name] MRN: [id number] Date of Birth: [birth year]  [facility]) CM/SW Contact  Truddie Hidden, RN Phone Number: 05/23/2022, 4:10 PM  Clinical Narrative:    Spoke with patient and his wife at bedside.  He uses oxygen at night. He has a concentrator that was given to him. No other needs identified at this time         Expected Discharge Plan and Services                                               Social Determinants of Health (SDOH) Interventions SDOH Screenings   Food Insecurity: No Food Insecurity (05/22/2022)  Housing: Low Risk  (05/22/2022)  Transportation Needs: No Transportation Needs (05/22/2022)  Utilities: Not At Risk (05/22/2022)  Depression (PHQ2-9): Low Risk  (12/30/2021)  Financial Resource Strain: Low Risk  (04/16/2017)  Physical Activity: Inactive (04/16/2017)  Social Connections: Unknown (04/16/2017)  Stress: No Stress Concern Present (04/16/2017)  Tobacco Use: Medium Risk (05/22/2022)    Readmission Risk Interventions     No data to display

## 2022-05-23 NOTE — Progress Notes (Signed)
PROGRESS NOTE    Antonio [name]  EAV:[id number] DOB: [birth year] DOA: 05/22/2022 PCP: Duanne Limerick, MD   Brief Narrative:  This 77 yrs old Male with PMH significant for severe COPD with nocturnal hypoxemia using 2 L of supplemental oxygen, paroxysmal A-fib on Xarelto and flecainide, not a candidate for invasive procedures due to severe lung disease, SVT, PAD s/p left SFA stent, hypertension, ulcerative colitis, BPH with a history of acute urinary retention requiring Foley presented in the ED with complaints of shortness of breath and cough for 1 week.  Patient reports cough is progressively getting worse and has developed chest pressure and fatigue.  On arrival in the ED he was hypertensive, tachycardic, tachypneic and febrile.  EKG shows A-fib with RVR with heart rate 185.  WBC 13 K, lactic acid 2.7, troponin 15> 34 RVP negative.  Chest x-ray reveals patchy airspace disease concerning for pneumonia.  Patient was given a dose of flecainide and started on Cardizem infusion and heart rate  improved .  Patient is admitted for sepsis secondary to pneumonia and started on empiric antibiotics ( Vancomycin,  Flagyl and cefepime ).  Assessment & Plan:   Principal Problem:   Atrial fibrillation with rapid ventricular response Active Problems:   CAP (community acquired pneumonia)   Sepsis   Stage 3 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   Nocturnal hypoxemia   Ulcerative colitis   PAD ss/p SFA stent(peripheral artery disease)   BPH (benign prostatic hyperplasia)   History of urinary retention  Atrial fibrillation with RVR: Likely secondary to pneumonia with sepsis. Heart rate improved after patient was started on Cardizem infusion. Continue flecainide 75 mg every 12 hours Continue diltiazem infusion and transition to oral per protocol. Continue Xarelto 20 mg po daily. Continue metoprolol 12.5 mg daily  Sepsis : Community-acquired pneumonia: Patient presented with fever, tachycardia,  tachypnea, lactic acid 2.7. Chest x-ray showed patchy airspace disease concerning for pneumonia. Continue IV fluid resuscitation. Lactic acid improved with IV hydration Initiated on empiric antibiotics , Continue ceftriaxone and Zithromax Continue antitussives,  add flutter valve and incentive spirometry Continue supplemental oxygen and wean as tolerated.  COPD stage III by Gold classification: Nocturnal hypoxemia: Patient regularly follows up with pulmonologist. Continue supplemental oxygen Continue DuoNeb as needed.   BPH: Continue Flomax 0.4 mg daily.   PAD s/p SFA stent(peripheral artery disease): Continue Xarelto and rosuvastatin.   Ulcerative colitis: No acute issues. Continue mesalamine   DVT prophylaxis: Xarelto Code Status: Full code Family Communication:No family at bed side Disposition Plan:   Status is: Inpatient Remains inpatient appropriate because: Admitted for sepsis secondary to community-acquired pneumonia and A-fib with RVR.   Consultants:  None  Procedures: None  Antimicrobials:  Anti-infectives (From admission, onward)    Start     Dose/Rate Route Frequency Ordered Stop   05/23/22 1500  cefTRIAXone (ROCEPHIN) 2 g in sodium chloride 0.9 % 100 mL IVPB        2 g 200 mL/hr over 30 Minutes Intravenous Every 24 hours 05/23/22 0959 05/27/22 1459   05/23/22 0100  ceFEPIme (MAXIPIME) 2 g in sodium chloride 0.9 % 100 mL IVPB  Status:  Discontinued        2 g 200 mL/hr over 30 Minutes Intravenous Every 8 hours 05/22/22 2127 05/23/22 0959   05/22/22 2300  azithromycin (ZITHROMAX) 500 mg in sodium chloride 0.9 % 250 mL IVPB        500 mg 250 mL/hr over 60 Minutes Intravenous Every 24 hours 05/22/22  2042 05/27/22 2259   05/22/22 1645  ceFEPIme (MAXIPIME) 2 g in sodium chloride 0.9 % 100 mL IVPB        2 g 200 mL/hr over 30 Minutes Intravenous  Once 05/22/22 1643 05/22/22 1736   05/22/22 1645  metroNIDAZOLE (FLAGYL) IVPB 500 mg        500 mg 100 mL/hr  over 60 Minutes Intravenous  Once 05/22/22 1643 05/22/22 2031   05/22/22 1645  vancomycin (VANCOCIN) IVPB 1000 mg/200 mL premix        1,000 mg 200 mL/hr over 60 Minutes Intravenous  Once 05/22/22 1643 05/22/22 1856      Subjective: Patient was seen and examined at bedside.  Overnight events noted.   Patient reports doing much better.  He remains on 2 L of supplemental oxygen. Patient denies any chest pain but reports having right-sided upper back pain.  Objective: Vitals:   05/23/22 0400 05/23/22 0700 05/23/22 1000 05/23/22 1151  BP: (!) 118/51 124/60 123/60 123/60  Pulse: 75 72 75 72  Resp: 20 (!) 23 (!) 24 19  Temp: 98.1 F (36.7 C) 98.2 F (36.8 C)  98.6 F (37 C)  TempSrc:  Oral  Oral  SpO2: 97% 98% 98% 99%  Weight:      Height:        Intake/Output Summary (Last 24 hours) at 05/23/2022 1310 Last data filed at 05/23/2022 1000 Gross per 24 hour  Intake 2975.31 ml  Output 550 ml  Net 2425.31 ml   Filed Weights   05/22/22 1622 05/22/22 2133  Weight: 66.7 kg 66.5 kg    Examination:  General exam: Appears comfortable, not in any acute distress, deconditioned. Respiratory system: CTA bilaterally, respiratory for normal, RR 13. Cardiovascular system: S1 & S2 heard, irregular rhythm, no murmur. Gastrointestinal system: Abdomen is soft, Non tender, Non distended, BS+ Central nervous system: Alert and oriented X 3. No focal neurological deficits. Extremities: No edema, no cyanosis, no clubbing Skin: No rashes, lesions or ulcers Psychiatry: Judgement and insight appear normal. Mood & affect appropriate.     Data Reviewed: I have personally reviewed following labs and imaging studies  CBC: Recent Labs  Lab 05/22/22 1630 05/23/22 0541  WBC 13.2* 8.5  HGB 15.1 11.5*  HCT 44.5 34.1*  MCV 90.6 91.9  PLT 188 155   Basic Metabolic Panel: Recent Labs  Lab 05/22/22 1630 05/23/22 0541  NA 129* 134*  K 3.7 3.6  CL 97* 106  CO2 19* 23  GLUCOSE 116* 104*  BUN 13  11  CREATININE 0.88 0.72  CALCIUM 8.8* 7.8*   GFR: Estimated Creatinine Clearance: 70.4 mL/min (by C-G formula based on SCr of 0.72 mg/dL). Liver Function Tests: No results for input(s): "AST", "ALT", "ALKPHOS", "BILITOT", "PROT", "ALBUMIN" in the last 168 hours. No results for input(s): "LIPASE", "AMYLASE" in the last 168 hours. No results for input(s): "AMMONIA" in the last 168 hours. Coagulation Profile: Recent Labs  Lab 05/22/22 1643 05/23/22 0541  INR 1.5* 2.3*   Cardiac Enzymes: No results for input(s): "CKTOTAL", "CKMB", "CKMBINDEX", "TROPONINI" in the last 168 hours. BNP (last 3 results) No results for input(s): "PROBNP" in the last 8760 hours. HbA1C: No results for input(s): "HGBA1C" in the last 72 hours. CBG: No results for input(s): "GLUCAP" in the last 168 hours. Lipid Profile: No results for input(s): "CHOL", "HDL", "LDLCALC", "TRIG", "CHOLHDL", "LDLDIRECT" in the last 72 hours. Thyroid Function Tests: No results for input(s): "TSH", "T4TOTAL", "FREET4", "T3FREE", "THYROIDAB" in the last 72 hours.  Anemia Panel: No results for input(s): "VITAMINB12", "FOLATE", "FERRITIN", "TIBC", "IRON", "RETICCTPCT" in the last 72 hours. Sepsis Labs: Recent Labs  Lab 05/22/22 1643 05/22/22 1909 05/23/22 0541  PROCALCITON  --   --  0.65  LATICACIDVEN 2.7* 1.2  --     Recent Results (from the past 240 hour(s))  Resp panel by RT-PCR (RSV, Flu A&B, Covid) Anterior Nasal Swab     Status: None   Collection Time: 05/22/22  4:43 PM   Specimen: Anterior Nasal Swab  Result Value Ref Range Status   SARS Coronavirus 2 by RT PCR NEGATIVE NEGATIVE Final    Comment: (NOTE) SARS-CoV-2 target nucleic acids are NOT DETECTED.  The SARS-CoV-2 RNA is generally detectable in upper respiratory specimens during the acute phase of infection. The lowest concentration of SARS-CoV-2 viral copies this assay can detect is 138 copies/mL. A negative result does not preclude SARS-Cov-2 infection and  should not be used as the sole basis for treatment or other patient management decisions. A negative result may occur with  improper specimen collection/handling, submission of specimen other than nasopharyngeal swab, presence of viral mutation(s) within the areas targeted by this assay, and inadequate number of viral copies(<138 copies/mL). A negative result must be combined with clinical observations, patient history, and epidemiological information. The expected result is Negative.  Fact Sheet for Patients:  BloggerCourse.com  Fact Sheet for Healthcare Providers:  SeriousBroker.it  This test is no t yet approved or cleared by the [place] FDA and  has been authorized for detection and/or diagnosis of SARS-CoV-2 by FDA under an Emergency Use Authorization (EUA). This EUA will remain  in effect (meaning this test can be used) for the duration of the COVID-19 declaration under Section 564(b)(1) of the Act, 21 U.S.C.section 360bbb-3(b)(1), unless the authorization is terminated  or revoked sooner.       Influenza A by PCR NEGATIVE NEGATIVE Final   Influenza B by PCR NEGATIVE NEGATIVE Final    Comment: (NOTE) The Xpert Xpress SARS-CoV-2/FLU/RSV plus assay is intended as an aid in the diagnosis of influenza from Nasopharyngeal swab specimens and should not be used as a sole basis for treatment. Nasal washings and aspirates are unacceptable for Xpert Xpress SARS-CoV-2/FLU/RSV testing.  Fact Sheet for Patients: BloggerCourse.com  Fact Sheet for Healthcare Providers: SeriousBroker.it  This test is not yet approved or cleared by the [place] FDA and has been authorized for detection and/or diagnosis of SARS-CoV-2 by FDA under an Emergency Use Authorization (EUA). This EUA will remain in effect (meaning this test can be used) for the duration of the COVID-19 declaration  under Section 564(b)(1) of the Act, 21 U.S.C. section 360bbb-3(b)(1), unless the authorization is terminated or revoked.     Resp Syncytial Virus by PCR NEGATIVE NEGATIVE Final    Comment: (NOTE) Fact Sheet for Patients: BloggerCourse.com  Fact Sheet for Healthcare Providers: SeriousBroker.it  This test is not yet approved or cleared by the [place] FDA and has been authorized for detection and/or diagnosis of SARS-CoV-2 by FDA under an Emergency Use Authorization (EUA). This EUA will remain in effect (meaning this test can be used) for the duration of the COVID-19 declaration under Section 564(b)(1) of the Act, 21 U.S.C. section 360bbb-3(b)(1), unless the authorization is terminated or revoked.  Performed at [facility], [address] [place], [place] [address]   Blood Culture (routine x 2)     Status: None (Preliminary result)   Collection Time: 05/22/22  4:43 PM   Specimen:  BLOOD  Result Value Ref Range Status   Specimen Description BLOOD RIGHT ANTECUBITAL  Final   Special Requests   Final    BOTTLES DRAWN AEROBIC AND ANAEROBIC Blood Culture results may not be optimal due to an inadequate volume of blood received in culture bottles   Culture   Final    NO GROWTH < 24 HOURS Performed at [facility], [address], [place], [place] [address]    Report Status PENDING  Incomplete  Blood Culture (routine x 2)     Status: None (Preliminary result)   Collection Time: 05/22/22  4:48 PM   Specimen: BLOOD  Result Value Ref Range Status   Specimen Description BLOOD BLOOD LEFT FOREARM  Final   Special Requests   Final    BOTTLES DRAWN AEROBIC AND ANAEROBIC Blood Culture adequate volume   Culture   Final    NO GROWTH < 24 HOURS Performed at [facility], [address] [place], [place] [address]    Report Status PENDING  Incomplete    Radiology Studies: DG Chest Port 1 View  Result  Date: 05/22/2022 CLINICAL DATA:  Shortness of breath. EXAM: PORTABLE CHEST 1 VIEW COMPARISON:  12/16/2021 FINDINGS: No evidence for pulmonary edema or pneumothorax. Extreme left costophrenic sulcus has not been included on the film. Streaky opacity in the lung bases suggest atelectasis with superimposed patchy airspace disease in the medial right base concerning for pneumonia. The cardiopericardial silhouette is within normal limits for size. Telemetry leads overlie the chest. Defibrillator pads overlie the chest. IMPRESSION: Patchy airspace disease in the medial right base concerning for pneumonia. Electronically Signed   By: Kennith Center M.D.   On: 05/22/2022 16:58    Scheduled Meds:  flecainide  75 mg Oral BID   mesalamine  4.8 g Oral Q breakfast   metoprolol succinate  12.5 mg Oral Daily   mometasone-formoterol  2 puff Inhalation BID   rivaroxaban  20 mg Oral Q supper   rosuvastatin  10 mg Oral Daily   tamsulosin  0.4 mg Oral Daily   Continuous Infusions:  azithromycin 500 mg (05/22/22 2254)   cefTRIAXone (ROCEPHIN)  IV     diltiazem (CARDIZEM) infusion 2.5 mg/hr (05/22/22 1827)     LOS: 1 day    Time spent: 50 MINS    Willeen Niece, MD [facility]   If 7PM-7AM, please contact night-coverage

## 2022-05-23 NOTE — TOC Initial Note (Signed)
Transition of Care [facility]) - Initial/Assessment Note    Patient Details  Name: Antonio [name] MRN: [id number] Date of Birth: January 24, 1946  Transition of Care [facility]) CM/SW Contact:    Truddie Hidden, RN Phone Number: 05/23/2022, 12:32 PM  Clinical Narrative:                  Transition of Care [facility]) Screening Note   Patient Details  Name: Antonio [name] Date of Birth: 07/10/1945   Transition of Care [facility]) CM/SW Contact:    Truddie Hidden, RN Phone Number: 05/23/2022, 12:33 PM    Transition of Care Department [facility]) has reviewed patient and no TOC needs have been identified at this time. We will continue to monitor patient advancement through interdisciplinary progression rounds. If new patient transition needs arise, please place a TOC consult.          Patient Goals and CMS Choice            Expected Discharge Plan and Services                                              Prior Living Arrangements/Services                       Activities of Daily Living Home Assistive Devices/Equipment: None ADL Screening (condition at time of admission) Patient's cognitive ability adequate to safely complete daily activities?: Yes Is the patient deaf or have difficulty hearing?: Yes Does the patient have difficulty seeing, even when wearing glasses/contacts?: No Does the patient have difficulty concentrating, remembering, or making decisions?: No Patient able to express need for assistance with ADLs?: Yes Does the patient have difficulty dressing or bathing?: Yes Independently performs ADLs?: No Does the patient have difficulty walking or climbing stairs?: No Weakness of Legs: None Weakness of Arms/Hands: None  Permission Sought/Granted                  Emotional Assessment              Admission diagnosis:  Atrial fibrillation with rapid ventricular response [I48.91] Sepsis due to pneumonia [J18.9, A41.9] Patient Active  Problem List   Diagnosis Date Noted   Atrial fibrillation with rapid ventricular response 05/22/2022   Sepsis 05/22/2022   CAP (community acquired pneumonia) 05/22/2022   Nocturnal hypoxemia 05/22/2022   BPH (benign prostatic hyperplasia) 05/22/2022   History of urinary retention 05/22/2022   Coronary artery disease involving native coronary artery of native heart without angina pectoris 03/21/2022   Precordial pain 02/19/2022   Atypical chest pain 12/21/2021   DOE (dyspnea on exertion) 01/24/2021   Secondary hypercoagulable state 12/19/2020   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   PAD ss/p SFA stent(peripheral artery disease) 11/08/2019   Paroxysmal atrial fibrillation 09/02/2019   Stiffness of finger joint of left hand 04/06/2019   History of COVID-19 01/20/2019   Severe persistent asthma without complication 01/20/2019   Medication management 09/20/2018   Stage 3 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 09/20/2018   Increased eosinophils in the blood 06/21/2018   Diffusion capacity of lung (dl), decreased 40/98/1191   COPD with acute exacerbation 06/22/2017   Atrial flutter, paroxysmal 10/17/2016   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/2016   SVT (supraventricular tachycardia) 10/17/2016   Ulcerative colitis 05/28/2016   Cervical stenosis of spinal canal 05/05/2016   Carotid stenosis 05/02/2016  Atherosclerosis of native arteries of extremity with intermittent claudication 05/02/2016   Cramping of hands 01/10/2016   Bilateral carpal tunnel syndrome 12/06/2015   Centrilobular Emphysema 04/25/2014   Former smoker 04/25/2014   Tobacco abuse 04/25/2014   PCP:  Duanne Limerick, MD Pharmacy:   [facility] [phone] [facility] [place] - [address] [place] [address] Phone: [phone] Fax: [phone]     Social Determinants of Health (SDOH) Social History: SDOH Screenings   Food Insecurity: No Food Insecurity (05/22/2022)  Housing: Low Risk  (05/22/2022)  Transportation Needs: No  Transportation Needs (05/22/2022)  Utilities: Not At Risk (05/22/2022)  Depression (PHQ2-9): Low Risk  (12/30/2021)  Financial Resource Strain: Low Risk  (04/16/2017)  Physical Activity: Inactive (04/16/2017)  Social Connections: Unknown (04/16/2017)  Stress: No Stress Concern Present (04/16/2017)  Tobacco Use: Medium Risk (05/22/2022)   SDOH Interventions:     Readmission Risk Interventions     No data to display

## 2022-05-24 ENCOUNTER — Encounter: Payer: Self-pay | Admitting: Internal Medicine

## 2022-05-24 DIAGNOSIS — I2510 Atherosclerotic heart disease of native coronary artery without angina pectoris: Secondary | ICD-10-CM

## 2022-05-24 DIAGNOSIS — A419 Sepsis, unspecified organism: Secondary | ICD-10-CM | POA: Diagnosis not present

## 2022-05-24 DIAGNOSIS — J188 Other pneumonia, unspecified organism: Secondary | ICD-10-CM

## 2022-05-24 DIAGNOSIS — E7800 Pure hypercholesterolemia, unspecified: Secondary | ICD-10-CM

## 2022-05-24 DIAGNOSIS — E785 Hyperlipidemia, unspecified: Secondary | ICD-10-CM

## 2022-05-24 DIAGNOSIS — I10 Essential (primary) hypertension: Secondary | ICD-10-CM

## 2022-05-24 DIAGNOSIS — I2489 Other forms of acute ischemic heart disease: Secondary | ICD-10-CM

## 2022-05-24 DIAGNOSIS — I4891 Unspecified atrial fibrillation: Secondary | ICD-10-CM | POA: Diagnosis not present

## 2022-05-24 LAB — TROPONIN I (HIGH SENSITIVITY): Troponin I (High Sensitivity): 16 ng/L (ref <18)

## 2022-05-24 MED ORDER — ALUM & MAG HYDROXIDE-SIMETH 200-200-20 MG/5ML PO SUSP
30.0000 mL | Freq: Four times a day (QID) | ORAL | Status: DC | PRN
  Administered 2022-05-24: 30 mL via ORAL
  Filled 2022-05-24: qty 30

## 2022-05-24 MED ORDER — AZITHROMYCIN 250 MG PO TABS
500.0000 mg | ORAL_TABLET | ORAL | Status: AC
  Administered 2022-05-24 – 2022-05-26 (×3): 500 mg via ORAL
  Filled 2022-05-24 (×3): qty 2

## 2022-05-24 MED ORDER — GUAIFENESIN ER 600 MG PO TB12
600.0000 mg | ORAL_TABLET | Freq: Two times a day (BID) | ORAL | Status: DC | PRN
  Administered 2022-05-24: 600 mg via ORAL
  Filled 2022-05-24: qty 1

## 2022-05-24 NOTE — Progress Notes (Signed)
PROGRESS NOTE    Antonio [name]  RCB:[id number] DOB: 1945/08/29 DOA: 05/22/2022 PCP: Duanne Limerick, MD   Brief Narrative:  This 78 yrs old Male with PMH significant for severe COPD with nocturnal hypoxemia using 2 L of supplemental oxygen, paroxysmal A-fib on Xarelto and flecainide, not a candidate for invasive procedures due to severe lung disease, SVT, PAD s/p left SFA stent, hypertension, ulcerative colitis, BPH with a history of acute urinary retention requiring Foley presented in the ED with complaints of shortness of breath and cough for 1 week.  Patient reports cough is progressively getting worse and has developed chest pressure and fatigue.  On arrival in the ED he was hypertensive, tachycardic, tachypneic and febrile.  EKG shows A-fib with RVR with heart rate 185.  WBC 13 K, lactic acid 2.7, troponin 15> 34 RVP negative.  Chest x-ray reveals patchy airspace disease concerning for pneumonia.  Patient was given a dose of flecainide and started on Cardizem infusion and heart rate  improved .  Patient is admitted for sepsis secondary to pneumonia and started on empiric antibiotics ( Vancomycin,  Flagyl and cefepime ).  Assessment & Plan:   Principal Problem:   Atrial fibrillation with rapid ventricular response Active Problems:   CAP (community acquired pneumonia)   Sepsis   Stage 3 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   Nocturnal hypoxemia   Ulcerative colitis   PAD ss/p SFA stent(peripheral artery disease)   BPH (benign prostatic hyperplasia)   History of urinary retention  Atrial fibrillation with RVR: Likely secondary to pneumonia with sepsis. Heart rate improved after patient was started on Cardizem infusion. Continue flecainide 75 mg every 12 hours Continue diltiazem infusion and transition to oral per protocol. Continue Xarelto 20 mg po daily. Continue metoprolol 12.5 mg daily. Heart Rate remained controlled.  Cardiology is consulted  Sepsis : Community-acquired  pneumonia: Patient presented with fever, tachycardia, tachypnea, lactic acid 2.7. Chest x-ray showed patchy airspace disease concerning for pneumonia. Continue IV fluid resuscitation. Lactic acid improved with IV hydration Continue ceftriaxone and Zithromax Continue antitussives,  add flutter valve and incentive spirometry Continue supplemental oxygen and wean as tolerated.  COPD stage III by Gold classification: Nocturnal hypoxemia: Patient regularly follows up with pulmonologist. Continue supplemental oxygen.  Continue DuoNeb as needed. Dr. Jayme Cloud consult appreciated.  Patient is started on inhalers.   BPH: Continue Flomax 0.4 mg daily.   PAD s/p SFA stent(peripheral artery disease): Continue Xarelto and rosuvastatin.   Ulcerative colitis: No acute issues. Continue mesalamine   DVT prophylaxis: Xarelto Code Status: Full code Family Communication:No family at bed side Disposition Plan:   Status is: Inpatient Remains inpatient appropriate because: Admitted for sepsis secondary to community-acquired pneumonia and A-fib with RVR.  Cardiology and pulmonology consulted   Consultants:  Cardiology Pulmonology  Procedures: None  Antimicrobials:  Anti-infectives (From admission, onward)    Start     Dose/Rate Route Frequency Ordered Stop   05/24/22 2200  azithromycin (ZITHROMAX) tablet 500 mg        500 mg Oral Every 24 hours 05/24/22 0908 05/27/22 2159   05/23/22 1500  cefTRIAXone (ROCEPHIN) 2 g in sodium chloride 0.9 % 100 mL IVPB        2 g 200 mL/hr over 30 Minutes Intravenous Every 24 hours 05/23/22 0959 05/27/22 1459   05/23/22 0100  ceFEPIme (MAXIPIME) 2 g in sodium chloride 0.9 % 100 mL IVPB  Status:  Discontinued        2 g 200 mL/hr  over 30 Minutes Intravenous Every 8 hours 05/22/22 2127 05/23/22 0959   05/22/22 2300  azithromycin (ZITHROMAX) 500 mg in sodium chloride 0.9 % 250 mL IVPB  Status:  Discontinued        500 mg 250 mL/hr over 60 Minutes  Intravenous Every 24 hours 05/22/22 2042 05/24/22 0908   05/22/22 1645  ceFEPIme (MAXIPIME) 2 g in sodium chloride 0.9 % 100 mL IVPB        2 g 200 mL/hr over 30 Minutes Intravenous  Once 05/22/22 1643 05/22/22 1736   05/22/22 1645  metroNIDAZOLE (FLAGYL) IVPB 500 mg        500 mg 100 mL/hr over 60 Minutes Intravenous  Once 05/22/22 1643 05/22/22 2031   05/22/22 1645  vancomycin (VANCOCIN) IVPB 1000 mg/200 mL premix        1,000 mg 200 mL/hr over 60 Minutes Intravenous  Once 05/22/22 1643 05/22/22 1856      Subjective: Patient was seen and examined at bedside. Overnight events noted.   Patient reports doing much better.  He was getting breathing treatment. He reports doing much better,  back to 2 L of supplemental oxygen. He reports feeling very weak, wants to have physical therapy.  Objective: Vitals:   05/24/22 0015 05/24/22 0400 05/24/22 0754 05/24/22 0800  BP:  134/60  138/66  Pulse:  83  81  Resp: 18 18  (!) 22  Temp:  98.7 F (37.1 C)  99.1 F (37.3 C)  TempSrc:  Oral  Oral  SpO2:  97% 99% 100%  Weight:      Height:        Intake/Output Summary (Last 24 hours) at 05/24/2022 1014 Last data filed at 05/24/2022 0926 Gross per 24 hour  Intake 658.17 ml  Output 2225 ml  Net -1566.83 ml   Filed Weights   05/22/22 1622 05/22/22 2133  Weight: 66.7 kg 66.5 kg    Examination:  General exam: Appears comfortable, NAD, deconditioned. Respiratory system: CTA bilaterally, respiratory for normal, RR 13. Cardiovascular system: S1 & S2 heard, irregular rhythm, no murmur. Gastrointestinal system: Abdomen is soft, non distended, non tender, BS+ Central nervous system: Alert and oriented X 3. No focal neurological deficits. Extremities: No edema, no cyanosis, no clubbing Skin: No rashes, lesions or ulcers Psychiatry: Judgement and insight appear normal. Mood & affect appropriate.     Data Reviewed: I have personally reviewed following labs and imaging studies  CBC: Recent  Labs  Lab 05/22/22 1630 05/23/22 0541  WBC 13.2* 8.5  HGB 15.1 11.5*  HCT 44.5 34.1*  MCV 90.6 91.9  PLT 188 155   Basic Metabolic Panel: Recent Labs  Lab 05/22/22 1630 05/23/22 0541  NA 129* 134*  K 3.7 3.6  CL 97* 106  CO2 19* 23  GLUCOSE 116* 104*  BUN 13 11  CREATININE 0.88 0.72  CALCIUM 8.8* 7.8*   GFR: Estimated Creatinine Clearance: 70.4 mL/min (by C-G formula based on SCr of 0.72 mg/dL). Liver Function Tests: No results for input(s): "AST", "ALT", "ALKPHOS", "BILITOT", "PROT", "ALBUMIN" in the last 168 hours. No results for input(s): "LIPASE", "AMYLASE" in the last 168 hours. No results for input(s): "AMMONIA" in the last 168 hours. Coagulation Profile: Recent Labs  Lab 05/22/22 1643 05/23/22 0541  INR 1.5* 2.3*   Cardiac Enzymes: No results for input(s): "CKTOTAL", "CKMB", "CKMBINDEX", "TROPONINI" in the last 168 hours. BNP (last 3 results) No results for input(s): "PROBNP" in the last 8760 hours. HbA1C: No results for input(s): "HGBA1C" in  the last 72 hours. CBG: No results for input(s): "GLUCAP" in the last 168 hours. Lipid Profile: No results for input(s): "CHOL", "HDL", "LDLCALC", "TRIG", "CHOLHDL", "LDLDIRECT" in the last 72 hours. Thyroid Function Tests: No results for input(s): "TSH", "T4TOTAL", "FREET4", "T3FREE", "THYROIDAB" in the last 72 hours. Anemia Panel: No results for input(s): "VITAMINB12", "FOLATE", "FERRITIN", "TIBC", "IRON", "RETICCTPCT" in the last 72 hours. Sepsis Labs: Recent Labs  Lab 05/22/22 1643 05/22/22 1909 05/23/22 0541  PROCALCITON  --   --  0.65  LATICACIDVEN 2.7* 1.2  --     Recent Results (from the past 240 hour(s))  Resp panel by RT-PCR (RSV, Flu A&B, Covid) Anterior Nasal Swab     Status: None   Collection Time: 05/22/22  4:43 PM   Specimen: Anterior Nasal Swab  Result Value Ref Range Status   SARS Coronavirus 2 by RT PCR NEGATIVE NEGATIVE Final    Comment: (NOTE) SARS-CoV-2 target nucleic acids are NOT  DETECTED.  The SARS-CoV-2 RNA is generally detectable in upper respiratory specimens during the acute phase of infection. The lowest concentration of SARS-CoV-2 viral copies this assay can detect is 138 copies/mL. A negative result does not preclude SARS-Cov-2 infection and should not be used as the sole basis for treatment or other patient management decisions. A negative result may occur with  improper specimen collection/handling, submission of specimen other than nasopharyngeal swab, presence of viral mutation(s) within the areas targeted by this assay, and inadequate number of viral copies(<138 copies/mL). A negative result must be combined with clinical observations, patient history, and epidemiological information. The expected result is Negative.  Fact Sheet for Patients:  BloggerCourse.com  Fact Sheet for Healthcare Providers:  SeriousBroker.it  This test is no t yet approved or cleared by the [place] FDA and  has been authorized for detection and/or diagnosis of SARS-CoV-2 by FDA under an Emergency Use Authorization (EUA). This EUA will remain  in effect (meaning this test can be used) for the duration of the COVID-19 declaration under Section 564(b)(1) of the Act, 21 U.S.C.section 360bbb-3(b)(1), unless the authorization is terminated  or revoked sooner.       Influenza A by PCR NEGATIVE NEGATIVE Final   Influenza B by PCR NEGATIVE NEGATIVE Final    Comment: (NOTE) The Xpert Xpress SARS-CoV-2/FLU/RSV plus assay is intended as an aid in the diagnosis of influenza from Nasopharyngeal swab specimens and should not be used as a sole basis for treatment. Nasal washings and aspirates are unacceptable for Xpert Xpress SARS-CoV-2/FLU/RSV testing.  Fact Sheet for Patients: BloggerCourse.com  Fact Sheet for Healthcare Providers: SeriousBroker.it  This test is not yet  approved or cleared by the [place] FDA and has been authorized for detection and/or diagnosis of SARS-CoV-2 by FDA under an Emergency Use Authorization (EUA). This EUA will remain in effect (meaning this test can be used) for the duration of the COVID-19 declaration under Section 564(b)(1) of the Act, 21 U.S.C. section 360bbb-3(b)(1), unless the authorization is terminated or revoked.     Resp Syncytial Virus by PCR NEGATIVE NEGATIVE Final    Comment: (NOTE) Fact Sheet for Patients: BloggerCourse.com  Fact Sheet for Healthcare Providers: SeriousBroker.it  This test is not yet approved or cleared by the [place] FDA and has been authorized for detection and/or diagnosis of SARS-CoV-2 by FDA under an Emergency Use Authorization (EUA). This EUA will remain in effect (meaning this test can be used) for the duration of the COVID-19 declaration under Section 564(b)(1) of the Act, 21  U.S.C. section 360bbb-3(b)(1), unless the authorization is terminated or revoked.  Performed at [facility], [address] [place], [place] [address]   Blood Culture (routine x 2)     Status: None (Preliminary result)   Collection Time: 05/22/22  4:43 PM   Specimen: BLOOD  Result Value Ref Range Status   Specimen Description BLOOD RIGHT ANTECUBITAL  Final   Special Requests   Final    BOTTLES DRAWN AEROBIC AND ANAEROBIC Blood Culture results may not be optimal due to an inadequate volume of blood received in culture bottles   Culture   Final    NO GROWTH 2 DAYS Performed at [facility], [address], [place], [place] [address]    Report Status PENDING  Incomplete  Blood Culture (routine x 2)     Status: None (Preliminary result)   Collection Time: 05/22/22  4:48 PM   Specimen: BLOOD  Result Value Ref Range Status   Specimen Description BLOOD BLOOD LEFT FOREARM  Final   Special Requests   Final    BOTTLES DRAWN  AEROBIC AND ANAEROBIC Blood Culture adequate volume   Culture   Final    NO GROWTH 2 DAYS Performed at [facility], [address], [place], [place] [address]    Report Status PENDING  Incomplete    Radiology Studies: DG Chest Port 1 View  Result Date: 05/22/2022 CLINICAL DATA:  Shortness of breath. EXAM: PORTABLE CHEST 1 VIEW COMPARISON:  12/16/2021 FINDINGS: No evidence for pulmonary edema or pneumothorax. Extreme left costophrenic sulcus has not been included on the film. Streaky opacity in the lung bases suggest atelectasis with superimposed patchy airspace disease in the medial right base concerning for pneumonia. The cardiopericardial silhouette is within normal limits for size. Telemetry leads overlie the chest. Defibrillator pads overlie the chest. IMPRESSION: Patchy airspace disease in the medial right base concerning for pneumonia. Electronically Signed   By: Kennith Center M.D.   On: 05/22/2022 16:58    Scheduled Meds:  azithromycin  500 mg Oral Q24H   flecainide  75 mg Oral BID   mesalamine  4.8 g Oral Q breakfast   metoprolol succinate  12.5 mg Oral Daily   mometasone-formoterol  2 puff Inhalation BID   revefenacin  175 mcg Nebulization Daily   rivaroxaban  20 mg Oral Q supper   rosuvastatin  10 mg Oral Daily   saccharomyces boulardii  250 mg Oral BID   tamsulosin  0.4 mg Oral Daily   Continuous Infusions:  cefTRIAXone (ROCEPHIN)  IV 2 g (05/23/22 1433)   diltiazem (CARDIZEM) infusion 2.5 mg/hr (05/24/22 0408)     LOS: 2 days    Time spent: 35 MINS    Willeen Niece, MD [facility]   If 7PM-7AM, please contact night-coverage

## 2022-05-24 NOTE — Consult Note (Signed)
Cardiology Consult    Patient ID: Antonio [name] MRN: [id number], DOB/AGE: Apr 07, 1945   Admit date: 05/22/2022 Date of Consult: 05/24/2022  Primary Physician: Duanne Limerick, MD Primary Cardiologist: Yvonne Kendall, MD Requesting Provider: Lindi Adie, MD  Patient Profile    Antonio [name] is a 77 y.o. 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 Idelle Leech.  Past Medical History   Past Medical History:  Diagnosis Date   Allergy    Calf pain    COPD (chronic obstructive pulmonary disease)    no Oxygen per pt   COVID-19    Dyspnea    Essential hypertension    History of echocardiogram    a. 02/2021 Echo: EF 55%, no rwma, nl RV fxn.   Hypokalemia    Internal hemorrhoids    Mixed hyperlipidemia    Muscle cramp    Muscle pain    Nonobstructive CAD (coronary artery disease)    a. 06/2021 MV: EF 65%, no isch/infarct,Cor Ca2+; b. 02/2022 Cor CTA: LM 25, LAD 25-49, LCX nl, OM1/2 nl, RCA nondom, nl. Ca2+ = 252 (42nd%'ile).   PAD (peripheral artery disease)    a. s/p L SFA stenting.   Paroxysmal atrial fibrillation 08/2019   a. CHA2DS2VASc = 4-->xarelto/flecainide.   Pneumonia    PONV (postoperative nausea and vomiting)    Tubular adenoma of colon    Ulcerative colitis     Past Surgical History:  Procedure Laterality Date   ANTERIOR CERVICAL DECOMP/DISCECTOMY FUSION N/A 05/05/2016   Procedure: ANTERIOR CERVICAL DECOMPRESSION FUSION CERVICAL THREE-FOUR.;  Surgeon: Julio Sicks, MD;  Location: [facility] OR;  Service: Neurosurgery;  Laterality: N/A;  right side approach   CATARACT EXTRACTION Bilateral    CERVICAL DISCECTOMY  04/2016   C3-C4   COLONOSCOPY     COLONOSCOPY  08/09/2015   HEMORRHOID SURGERY  1988   leg stent Left approx 5-6 yrs ago   2 to left leg    MENISCUS REPAIR Left 12/2013   MOHS SURGERY     POLYPECTOMY     ROTATOR CUFF REPAIR Left 2007   TONSILLECTOMY AND ADENOIDECTOMY  1957     Allergies  Allergies  Allergen Reactions   Penicillins Rash    Has patient had a PCN reaction causing immediate rash, facial/tongue/throat swelling, SOB or lightheadedness with hypotension:Yes Has patient had a PCN reaction causing severe rash involving mucus membranes or skin necrosis:Yes Has patient had a PCN reaction that required hospitalization:No Has patient had a PCN reaction occurring within the last 10 years:No If all of the above answers are "NO", then may proceed with Cephalosporin use.     History of Present Illness    77 y.o. 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  Prior echocardiogram in January 2023 showed normal LV function.  He underwent stress testing in May 2023 in the setting of chest tightness, which showed normal LV function without ischemia or infarct.  Coronary calcium was noted.  January 2024, in the setting of recurrent chest discomfort, decision was made to pursue coronary CT angiogram, which showed mild, nonobstructive CAD.  Regarding paroxysmal atrial fibrillation, he is now followed by Dr. Lalla Brothers.  He  is not felt to be a candidate for advanced procedure secondary to severe lung disease.  He has been managed with flecainide therapy and is currently on 75 mg twice daily at home.  Earlier this year, his metoprolol dose was reduced secondary to baseline bradycardia, he is currently on 12.5 mg daily.  He was doing well when he was last seen by Dr. Okey Dupre in cardiology clinic in March.  Beginning on April 8, patient started experiencing mild dyspnea, malaise, and pleuritic back pain.  This progressed throughout the week and became associated with chest tightness, and worsening dyspnea.  He presented to the emergency  department on April 11, where he is found to be in atrial fibrillation with rapid ventricular response.  Rates were in the 170s to 180s.  His temperature was 102.5.  White count was 13,000 and lactate was elevated at 2.7.  Troponin was mildly elevated to a peak of 34.  Chest x-ray showed patchy airspace disease in the medial right base concerning for pneumonia.  He was placed on IV diltiazem as well as antibiotics for management of pneumonia and sepsis and admitted for further evaluation.  He subsequently converted to sinus rhythm and has been maintained on IV diltiazem at 2.5 mg/h.  Home doses of flecainide and metoprolol have been continued.  Today, patient notes that he continues to feel much better, but feels deconditioned.  He typically only uses oxygen at night but has continued to require at least 4 L/min throughout the day.  We have been asked to comment on his atrial fibrillation.  Inpatient Medications     azithromycin  500 mg Oral Q24H   flecainide  75 mg Oral BID   mesalamine  4.8 g Oral Q breakfast   metoprolol succinate  12.5 mg Oral Daily   mometasone-formoterol  2 puff Inhalation BID   revefenacin  175 mcg Nebulization Daily   rivaroxaban  20 mg Oral Q supper   rosuvastatin  10 mg Oral Daily   saccharomyces boulardii  250 mg Oral BID   tamsulosin  0.4 mg Oral Daily    Family History    Family History  Problem Relation Age of Onset   Cancer Mother        leukemia   Colon polyps Mother    Heart disease Father    Heart attack Father 84   Healthy Daughter    Healthy Son    Colon cancer Neg Hx    Esophageal cancer Neg Hx    Rectal cancer Neg Hx    Stomach cancer Neg Hx    He indicated that his mother is deceased. He indicated that his father is deceased. He indicated that his daughter is alive. He indicated that his son is alive. He indicated that the status of his neg hx is unknown.   Social History    Social History   Socioeconomic History   Marital status:  Married    Spouse name: Not on file   Number of children: 2   Years of education: Not on file   Highest education level: Bachelor's degree (e.g., BA, AB, BS)  Occupational History   Occupation: retired  Tobacco Use   Smoking status: Former    Packs/day: 0.50    Years: 35.00    Additional pack years: 0.00    Total pack years: 17.50    Types: Cigarettes, Cigars    Quit date: 06/11/2011    Years since quitting: 10.9   Smokeless tobacco: Never   Tobacco  comments:    Former smoker 12/19/2020  Vaping Use   Vaping Use: Former  Substance and Sexual Activity   Alcohol use: Yes    Alcohol/week: 6.0 standard drinks of alcohol    Types: 6 Shots of liquor per week    Comment: wine, liquor, or beer   Drug use: No   Sexual activity: Yes  Other Topics Concern   Not on file  Social History Narrative   Not on file   Social Determinants of Health   Financial Resource Strain: Low Risk  (04/16/2017)   Overall Financial Resource Strain (CARDIA)    Difficulty of Paying Living Expenses: Not hard at all  Food Insecurity: No Food Insecurity (05/22/2022)   Hunger Vital Sign    Worried About Running Out of Food in the Last Year: Never true    Ran Out of Food in the Last Year: Never true  Transportation Needs: No Transportation Needs (05/22/2022)   PRAPARE - Administrator, Civil Service (Medical): No    Lack of Transportation (Non-Medical): No  Physical Activity: Inactive (04/16/2017)   Exercise Vital Sign    Days of Exercise per Week: 0 days    Minutes of Exercise per Session: 0 min  Stress: No Stress Concern Present (04/16/2017)   [employer] of Occupational Health - Occupational Stress Questionnaire    Feeling of Stress : Not at all  Social Connections: Unknown (04/16/2017)   Social Connection and Isolation Panel [NHANES]    Frequency of Communication with Friends and Family: Patient declined    Frequency of Social Gatherings with Friends and Family: Patient declined    Attends  Religious Services: Patient declined    Database administrator or Organizations: Patient declined    Attends Banker Meetings: Patient declined    Marital Status: Married  Catering manager Violence: Not At Risk (05/22/2022)   Humiliation, Afraid, Rape, and Kick questionnaire    Fear of Current or Ex-Partner: No    Emotionally Abused: No    Physically Abused: No    Sexually Abused: No     Review of Systems    General:  +++ malaise/generalized wkns.  No chills, fever, night sweats or weight changes.  Cardiovascular:  +++ chest tightness, +++ dyspnea on exertion, no edema, orthopnea, palpitations, paroxysmal nocturnal dyspnea. Dermatological: No rash, lesions/masses Respiratory: +++ cough, +++ dyspnea Urologic: No hematuria, dysuria Abdominal:   No nausea, vomiting, diarrhea, bright red blood per rectum, melena, or hematemesis Neurologic:  No visual changes, wkns, changes in mental status. All other systems reviewed and are otherwise negative except as noted above.  Physical Exam    Blood pressure 138/66, pulse 81, temperature 99.1 F (37.3 C), temperature source Oral, resp. rate (!) 22, height 5\' 10"  (1.778 m), weight 66.5 kg, SpO2 100 %.  General: Pleasant, NAD Psych: Normal affect. Neuro: Alert and oriented X 3. Moves all extremities spontaneously. HEENT: Normal  Neck: Supple without bruits or JVD. Lungs:  Resp regular and unlabored, markedly diminished breath sounds bilat. Heart: RRR no s3, s4, or murmurs. Abdomen: Soft, non-tender, non-distended, BS + x 4.  Extremities: No clubbing, cyanosis or edema. DP/PT2+, Radials 2+ and equal bilaterally.  Labs    Cardiac Enzymes Recent Labs  Lab 05/22/22 1630 05/22/22 1909 05/24/22 0724  TROPONINIHS 15 34* 16     BNP    Component Value Date/Time   BNP 61.9 12/16/2021 1334    Lab Results  Component Value Date   WBC 8.5 05/23/2022  HGB 11.5 (L) 05/23/2022   HCT 34.1 (L) 05/23/2022   MCV 91.9 05/23/2022    PLT 155 05/23/2022    Recent Labs  Lab 05/23/22 0541  NA 134*  K 3.6  CL 106  CO2 23  BUN 11  CREATININE 0.72  CALCIUM 7.8*  GLUCOSE 104*   Lab Results  Component Value Date   CHOL 127 02/19/2022   HDL 55 02/19/2022   LDLCALC 57 02/19/2022   TRIG 73 02/19/2022     Radiology Studies    DG Chest Port 1 View  Result Date: 05/22/2022 CLINICAL DATA:  Shortness of breath. EXAM: PORTABLE CHEST 1 VIEW COMPARISON:  12/16/2021 FINDINGS: No evidence for pulmonary edema or pneumothorax. Extreme left costophrenic sulcus has not been included on the film. Streaky opacity in the lung bases suggest atelectasis with superimposed patchy airspace disease in the medial right base concerning for pneumonia. The cardiopericardial silhouette is within normal limits for size. Telemetry leads overlie the chest. Defibrillator pads overlie the chest. IMPRESSION: Patchy airspace disease in the medial right base concerning for pneumonia. Electronically Signed   By: Kennith Center M.D.   On: 05/22/2022 16:58    ECG & Cardiac Imaging    Afib, 173, rightward axis, mild inflat upsloping ST depression in setting of baseline artifact - personally reviewed.  Tele: RSR, PAC's, 70's to 80's  Assessment & Plan    1.  Paroxysmal atrial fibrillation: Patient with a history of paroxysmal atrial fibrillation which has been controlled in the outpatient setting with flecainide 75 mg twice daily and low-dose metoprolol (dose limited by baseline bradycardia).  Patient presented April 11 with a several day history of progressive malaise, dyspnea, and finding of fever, pneumonia, and sepsis.  He was also in atrial fibrillation with rapid ventricular response on arrival.  He subsequently converted to sinus rhythm and has been maintaining sinus for greater than 24 hours.  He is currently on diltiazem at 2.5 mg/h.  I will DC diltiazem and continue home dose of flecainide and metoprolol.  Suspect PAF largely driven by hyperadrenergic  state of sepsis/pneumonia.  Continue anticoagulation with Xarelto.  Outpatient follow-up with Dr. Lalla Brothers.  2.  Sepsis/right basilar pneumonia: Slowly improving.  Antibiotic management per medicine team.  3.  Demand ischemia/nonobstructive CAD: Mild troponin elevation to a peak of 34 in the setting of rapid atrial fibrillation, sepsis/pneumonia.  Recent coronary CT angiogram in January 2024 showing mild, nonobstructive CAD.  In that setting, no plans for additional ischemic evaluation.  Continue low-dose beta-blocker and statin.  No aspirin in the setting of Xarelto therapy.  4.  Severe COPD: Markedly diminished breath sounds on examination.  Followed by Dr. Jayme Cloud in the outpatient setting.  Uses oxygen at night at home and currently requiring during the day.  Per medicine team.  5.  Essential hypertension: Stable on beta-blocker therapy.  6.  Hyperlipidemia: Continue statin therapy.  LDL of 57 in January.  Risk Assessment/Risk Scores:          CHA2DS2-VASc Score = 4   This indicates a 4.8% annual risk of stroke. The patient's score is based upon: CHF History: 0 HTN History: 1 Diabetes History: 0 Stroke History: 0 Vascular Disease History: 1 Age Score: 2 Gender Score: 0     Signed, Nicolasa Ducking, NP 05/24/2022, 10:39 AM  For questions or updates, please contact   Please consult [email or web address] for contact info under Cardiology/STEMI.

## 2022-05-25 DIAGNOSIS — I2489 Other forms of acute ischemic heart disease: Secondary | ICD-10-CM

## 2022-05-25 DIAGNOSIS — I2510 Atherosclerotic heart disease of native coronary artery without angina pectoris: Secondary | ICD-10-CM | POA: Diagnosis not present

## 2022-05-25 DIAGNOSIS — A419 Sepsis, unspecified organism: Secondary | ICD-10-CM | POA: Diagnosis not present

## 2022-05-25 DIAGNOSIS — I4891 Unspecified atrial fibrillation: Secondary | ICD-10-CM | POA: Diagnosis not present

## 2022-05-25 DIAGNOSIS — J188 Other pneumonia, unspecified organism: Secondary | ICD-10-CM | POA: Diagnosis not present

## 2022-05-25 DIAGNOSIS — I10 Essential (primary) hypertension: Secondary | ICD-10-CM

## 2022-05-25 LAB — CULTURE, BLOOD (ROUTINE X 2)

## 2022-05-25 MED ORDER — SALINE SPRAY 0.65 % NA SOLN
1.0000 | NASAL | Status: DC | PRN
  Administered 2022-05-25: 1 via NASAL
  Filled 2022-05-25: qty 44

## 2022-05-25 MED ORDER — LOPERAMIDE HCL 2 MG PO CAPS
2.0000 mg | ORAL_CAPSULE | Freq: Three times a day (TID) | ORAL | Status: AC
  Administered 2022-05-25 (×3): 2 mg via ORAL
  Filled 2022-05-25 (×3): qty 1

## 2022-05-25 MED ORDER — FUROSEMIDE 20 MG PO TABS
20.0000 mg | ORAL_TABLET | Freq: Once | ORAL | Status: AC
  Administered 2022-05-25: 20 mg via ORAL
  Filled 2022-05-25: qty 1

## 2022-05-25 NOTE — Progress Notes (Signed)
PROGRESS NOTE    Antonio [name]  WUJ:[id number] DOB: 1945/08/31 DOA: 05/22/2022 PCP: Duanne Limerick, MD   Brief Narrative:  This 77 yrs old Male with PMH significant for severe COPD with nocturnal hypoxemia using 2 L of supplemental oxygen, paroxysmal A-fib on Xarelto and flecainide, not a candidate for invasive procedures due to severe lung disease, SVT, PAD s/p left SFA stent, hypertension, ulcerative colitis, BPH with a history of acute urinary retention requiring Foley presented in the ED with complaints of shortness of breath and cough for 1 week.  Patient reports cough is progressively getting worse and has developed chest pressure and fatigue.  On arrival in the ED he was hypertensive, tachycardic, tachypneic and febrile.  EKG shows A-fib with RVR with heart rate 185.  WBC 13 K, lactic acid 2.7, troponin 15> 34 RVP negative.  Chest x-ray reveals patchy airspace disease concerning for pneumonia.  Patient was given a dose of flecainide and started on Cardizem infusion and heart rate  improved .  Patient is admitted for sepsis secondary to pneumonia and started on empiric antibiotics ( Vancomycin,  Flagyl and cefepime ).  Assessment & Plan:   Principal Problem:   Atrial fibrillation with rapid ventricular response Active Problems:   CAP (community acquired pneumonia)   Sepsis due to pneumonia   Stage 3 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   Nocturnal hypoxemia   Ulcerative colitis   PAD ss/p SFA stent(peripheral artery disease)   BPH (benign prostatic hyperplasia)   History of urinary retention   Demand ischemia   Pure hypercholesterolemia   Primary hypertension  Atrial fibrillation with RVR: Likely secondary to pneumonia with sepsis. Heart rate improved after patient was started on Cardizem infusion. Continue flecainide 75 mg every 12 hours Continue diltiazem infusion and transition to oral per protocol. Continue Xarelto 20 mg po daily. Continue metoprolol 12.5 mg  daily. Cardiology is consulted.  Not to escalate the dose of metoprolol due to bradycardia at bedside. Heart Rate remained controlled.  Cardizem infusion discontinued. Continue flecainide, metoprolol and Xarelto.  Sepsis : Community-acquired pneumonia: Patient presented with fever, tachycardia, tachypnea, lactic acid 2.7. Chest x-ray showed patchy airspace disease concerning for pneumonia. Continue IV fluid resuscitation. Lactic acid improved with IV hydration Continue ceftriaxone and Zithromax x 5 days. Continue antitussives,  add flutter valve and incentive spirometry Continue supplemental oxygen and wean as tolerated.  COPD stage III by Gold classification: Nocturnal hypoxemia: Patient regularly follows up with pulmonologist. Continue supplemental oxygen.  Continue DuoNeb as needed. Dr. Jayme Cloud consult appreciated.  Patient is started on inhalers.   BPH: Continue Flomax 0.4 mg daily.   PAD s/p SFA stent(peripheral artery disease): Continue Xarelto and rosuvastatin.   Ulcerative colitis: No acute issues. Continue mesalamine   DVT prophylaxis: Xarelto Code Status: Full code Family Communication:No family at bed side Disposition Plan:   Status is: Inpatient Remains inpatient appropriate because: Admitted for sepsis secondary to community-acquired pneumonia and A-fib with RVR.  Cardiology and pulmonology consulted.  PT and OT pending.   Consultants:  Cardiology Pulmonology  Procedures: None  Antimicrobials:  Anti-infectives (From admission, onward)    Start     Dose/Rate Route Frequency Ordered Stop   05/24/22 2200  azithromycin (ZITHROMAX) tablet 500 mg        500 mg Oral Every 24 hours 05/24/22 0908 05/27/22 2159   05/23/22 1500  cefTRIAXone (ROCEPHIN) 2 g in sodium chloride 0.9 % 100 mL IVPB        2 g 200  mL/hr over 30 Minutes Intravenous Every 24 hours 05/23/22 0959 05/27/22 1459   05/23/22 0100  ceFEPIme (MAXIPIME) 2 g in sodium chloride 0.9 % 100 mL IVPB   Status:  Discontinued        2 g 200 mL/hr over 30 Minutes Intravenous Every 8 hours 05/22/22 2127 05/23/22 0959   05/22/22 2300  azithromycin (ZITHROMAX) 500 mg in sodium chloride 0.9 % 250 mL IVPB  Status:  Discontinued        500 mg 250 mL/hr over 60 Minutes Intravenous Every 24 hours 05/22/22 2042 05/24/22 0908   05/22/22 1645  ceFEPIme (MAXIPIME) 2 g in sodium chloride 0.9 % 100 mL IVPB        2 g 200 mL/hr over 30 Minutes Intravenous  Once 05/22/22 1643 05/22/22 1736   05/22/22 1645  metroNIDAZOLE (FLAGYL) IVPB 500 mg        500 mg 100 mL/hr over 60 Minutes Intravenous  Once 05/22/22 1643 05/22/22 2031   05/22/22 1645  vancomycin (VANCOCIN) IVPB 1000 mg/200 mL premix        1,000 mg 200 mL/hr over 60 Minutes Intravenous  Once 05/22/22 1643 05/22/22 1856      Subjective: Patient was seen and examined at bedside. Overnight events noted.   Patient reports doing better, he refuses to go to nursing home,  wants to go home. He remains on 3 L of supplemental oxygen. He reports feeling very weak, wants to have physical therapy.  Objective: Vitals:   05/24/22 2341 05/25/22 0400 05/25/22 0741 05/25/22 0752  BP: (!) 140/64 135/64 135/70   Pulse: 73 68 68   Resp: 19 20 18    Temp: 98.4 F (36.9 C) 98.1 F (36.7 C) 97.8 F (36.6 C)   TempSrc: Axillary Oral    SpO2: 99% 99% 99% 98%  Weight:  68.5 kg    Height:        Intake/Output Summary (Last 24 hours) at 05/25/2022 1035 Last data filed at 05/25/2022 0925 Gross per 24 hour  Intake 120 ml  Output 851 ml  Net -731 ml   Filed Weights   05/22/22 1622 05/22/22 2133 05/25/22 0400  Weight: 66.7 kg 66.5 kg 68.5 kg    Examination:  General exam: Appears comfortable, deconditioned, not in any distress. Respiratory system: CTA bilaterally, respiratory for normal, RR 15. Cardiovascular system: S1 & S2 heard, irregular rhythm, no murmur. Gastrointestinal system: Abdomen is soft, non distended, non tender, BS+ Central nervous  system: Alert and oriented x 3.  No focal neurological deficits. Extremities: No edema, no cyanosis, no clubbing Skin: No rashes, lesions or ulcers Psychiatry: Judgement and insight appear normal. Mood & affect appropriate.     Data Reviewed: I have personally reviewed following labs and imaging studies  CBC: Recent Labs  Lab 05/22/22 1630 05/23/22 0541  WBC 13.2* 8.5  HGB 15.1 11.5*  HCT 44.5 34.1*  MCV 90.6 91.9  PLT 188 155   Basic Metabolic Panel: Recent Labs  Lab 05/22/22 1630 05/23/22 0541  NA 129* 134*  K 3.7 3.6  CL 97* 106  CO2 19* 23  GLUCOSE 116* 104*  BUN 13 11  CREATININE 0.88 0.72  CALCIUM 8.8* 7.8*   GFR: Estimated Creatinine Clearance: 72.5 mL/min (by C-G formula based on SCr of 0.72 mg/dL). Liver Function Tests: No results for input(s): "AST", "ALT", "ALKPHOS", "BILITOT", "PROT", "ALBUMIN" in the last 168 hours. No results for input(s): "LIPASE", "AMYLASE" in the last 168 hours. No results for input(s): "AMMONIA" in  the last 168 hours. Coagulation Profile: Recent Labs  Lab 05/22/22 1643 05/23/22 0541  INR 1.5* 2.3*   Cardiac Enzymes: No results for input(s): "CKTOTAL", "CKMB", "CKMBINDEX", "TROPONINI" in the last 168 hours. BNP (last 3 results) No results for input(s): "PROBNP" in the last 8760 hours. HbA1C: No results for input(s): "HGBA1C" in the last 72 hours. CBG: No results for input(s): "GLUCAP" in the last 168 hours. Lipid Profile: No results for input(s): "CHOL", "HDL", "LDLCALC", "TRIG", "CHOLHDL", "LDLDIRECT" in the last 72 hours. Thyroid Function Tests: No results for input(s): "TSH", "T4TOTAL", "FREET4", "T3FREE", "THYROIDAB" in the last 72 hours. Anemia Panel: No results for input(s): "VITAMINB12", "FOLATE", "FERRITIN", "TIBC", "IRON", "RETICCTPCT" in the last 72 hours. Sepsis Labs: Recent Labs  Lab 05/22/22 1643 05/22/22 1909 05/23/22 0541  PROCALCITON  --   --  0.65  LATICACIDVEN 2.7* 1.2  --     Recent Results  (from the past 240 hour(s))  Resp panel by RT-PCR (RSV, Flu A&B, Covid) Anterior Nasal Swab     Status: None   Collection Time: 05/22/22  4:43 PM   Specimen: Anterior Nasal Swab  Result Value Ref Range Status   SARS Coronavirus 2 by RT PCR NEGATIVE NEGATIVE Final    Comment: (NOTE) SARS-CoV-2 target nucleic acids are NOT DETECTED.  The SARS-CoV-2 RNA is generally detectable in upper respiratory specimens during the acute phase of infection. The lowest concentration of SARS-CoV-2 viral copies this assay can detect is 138 copies/mL. A negative result does not preclude SARS-Cov-2 infection and should not be used as the sole basis for treatment or other patient management decisions. A negative result may occur with  improper specimen collection/handling, submission of specimen other than nasopharyngeal swab, presence of viral mutation(s) within the areas targeted by this assay, and inadequate number of viral copies(<138 copies/mL). A negative result must be combined with clinical observations, patient history, and epidemiological information. The expected result is Negative.  Fact Sheet for Patients:  BloggerCourse.com  Fact Sheet for Healthcare Providers:  SeriousBroker.it  This test is no t yet approved or cleared by the [place] FDA and  has been authorized for detection and/or diagnosis of SARS-CoV-2 by FDA under an Emergency Use Authorization (EUA). This EUA will remain  in effect (meaning this test can be used) for the duration of the COVID-19 declaration under Section 564(b)(1) of the Act, 21 U.S.C.section 360bbb-3(b)(1), unless the authorization is terminated  or revoked sooner.       Influenza A by PCR NEGATIVE NEGATIVE Final   Influenza B by PCR NEGATIVE NEGATIVE Final    Comment: (NOTE) The Xpert Xpress SARS-CoV-2/FLU/RSV plus assay is intended as an aid in the diagnosis of influenza from Nasopharyngeal swab  specimens and should not be used as a sole basis for treatment. Nasal washings and aspirates are unacceptable for Xpert Xpress SARS-CoV-2/FLU/RSV testing.  Fact Sheet for Patients: BloggerCourse.com  Fact Sheet for Healthcare Providers: SeriousBroker.it  This test is not yet approved or cleared by the [place] FDA and has been authorized for detection and/or diagnosis of SARS-CoV-2 by FDA under an Emergency Use Authorization (EUA). This EUA will remain in effect (meaning this test can be used) for the duration of the COVID-19 declaration under Section 564(b)(1) of the Act, 21 U.S.C. section 360bbb-3(b)(1), unless the authorization is terminated or revoked.     Resp Syncytial Virus by PCR NEGATIVE NEGATIVE Final    Comment: (NOTE) Fact Sheet for Patients: BloggerCourse.com  Fact Sheet for Healthcare Providers: SeriousBroker.it  This  test is not yet approved or cleared by the [place] and has been authorized for detection and/or diagnosis of SARS-CoV-2 by FDA under an Emergency Use Authorization (EUA). This EUA will remain in effect (meaning this test can be used) for the duration of the COVID-19 declaration under Section 564(b)(1) of the Act, 21 U.S.C. section 360bbb-3(b)(1), unless the authorization is terminated or revoked.  Performed at [facility], [address] [place], [place] [address]   Blood Culture (routine x 2)     Status: None (Preliminary result)   Collection Time: 05/22/22  4:43 PM   Specimen: BLOOD  Result Value Ref Range Status   Specimen Description BLOOD RIGHT ANTECUBITAL  Final   Special Requests   Final    BOTTLES DRAWN AEROBIC AND ANAEROBIC Blood Culture results may not be optimal due to an inadequate volume of blood received in culture bottles   Culture   Final    NO GROWTH 3 DAYS Performed at [facility], [address] [place], [place] [address]    Report Status PENDING  Incomplete  Blood Culture (routine x 2)     Status: None (Preliminary result)   Collection Time: 05/22/22  4:48 PM   Specimen: BLOOD  Result Value Ref Range Status   Specimen Description BLOOD BLOOD LEFT FOREARM  Final   Special Requests   Final    BOTTLES DRAWN AEROBIC AND ANAEROBIC Blood Culture adequate volume   Culture   Final    NO GROWTH 3 DAYS Performed at [facility], [address] [place], [place] [address]    Report Status PENDING  Incomplete    Radiology Studies: No results found.  Scheduled Meds:  azithromycin  500 mg Oral Q24H   flecainide  75 mg Oral BID   loperamide  2 mg Oral TID   mesalamine  4.8 g Oral Q breakfast   metoprolol succinate  12.5 mg Oral Daily   mometasone-formoterol  2 puff Inhalation BID   revefenacin  175 mcg Nebulization Daily   rivaroxaban  20 mg Oral Q supper   rosuvastatin  10 mg Oral Daily   saccharomyces boulardii  250 mg Oral BID   tamsulosin  0.4 mg Oral Daily   Continuous Infusions:  cefTRIAXone (ROCEPHIN)  IV Stopped (05/24/22 1515)     LOS: 3 days    Time spent: 35 MINS    Willeen Niece, MD [facility]   If 7PM-7AM, please contact night-coverage

## 2022-05-25 NOTE — Evaluation (Signed)
Physical Therapy Evaluation Patient Details Name: Antonio [name] MRN: [id number] DOB: Oct 06, 1945 Today's Date: 05/25/2022  History of Present Illness  Pt is a 77 y/o M admitted on 05/22/22 after presenting to the ED with c/o SOB & cough x 1 week. On arrival pt found to be in a-fib with RVR. Pt also being treated for sepsis 2/2 CAP. PMH: severe COPD with nocturnal hypoxemia using 2L supplemental O2, paroxysmal a-fib, SVT, PAD s/p L SFA stent, HTN, ulcerative colitis, BPH  Clinical Impression  Pt seen for PT evaluation with pt agreeable, wife present. Pt reports a week ago/prior to admission he was independent without AD, driving, mowing the yard. On this date, overall session is limited by bowel incontinence (pt believed to be having ulcerative colitis flare). Pt is able to transfer STS & step pivot with min assist with RW & tolerates standing 1-2 minutes with BUE support on RW to allow PT to perform peri hygiene total assist. Pt sat EOB ~5 minutes to engage in discussion re: d/c planning with pt demonstrating worsening R lateral lean as time progresses. Pt does note SOB after standing/transfer but SPO2 >90% on 3.5L/min. Pt would benefit from intensive rehab services to address endurance, activity tolerance, balance, gait & stair negotiation to facilitate return to independent PLOF.     Recommendations for follow up therapy are one component of a multi-disciplinary discharge planning process, led by the attending physician.  Recommendations may be updated based on patient status, additional functional criteria and insurance authorization.  Follow Up Recommendations       Assistance Recommended at Discharge Intermittent Supervision/Assistance  Patient can return home with the following  A little help with walking and/or transfers;A little help with bathing/dressing/bathroom;Assistance with cooking/housework;Assist for transportation;Help with stairs or ramp for entrance    Equipment  Recommendations Rolling walker (2 wheels);BSC/3in1  Recommendations for Other Services  OT consult    Functional Status Assessment Patient has had a recent decline in their functional status and demonstrates the ability to make significant improvements in function in a reasonable and predictable amount of time.     Precautions / Restrictions Precautions Precautions: Fall Restrictions Weight Bearing Restrictions: No      Mobility  Bed Mobility               General bed mobility comments: not tested    Transfers Overall transfer level: Needs assistance Equipment used: Rolling walker (2 wheels) Transfers: Sit to/from Stand, Bed to chair/wheelchair/BSC Sit to Stand: Min assist   Step pivot transfers: Min assist       General transfer comment: STS from recliner with min assist, step pivot bed>recliner with RW & min assist    Ambulation/Gait                  Stairs            Wheelchair Mobility    Modified Rankin (Stroke Patients Only)       Balance Overall balance assessment: Needs assistance Sitting-balance support: Feet supported Sitting balance-Leahy Scale: Fair Sitting balance - Comments: Pt with increasing R lateral lean as pt sits for prolonged time Postural control: Right lateral lean Standing balance support: During functional activity, Reliant on assistive device for balance, Bilateral upper extremity supported Standing balance-Leahy Scale: Fair                               Pertinent Vitals/Pain Pain Assessment Pain Assessment: No/denies pain  Home Living Family/patient expects to be discharged to:: Private residence Living Arrangements: Spouse/significant other Available Help at Discharge: Family Type of Home: House Home Access: Stairs to enter Entrance Stairs-Rails: Right Entrance Stairs-Number of Steps: 4 Alternate Level Stairs-Number of Steps: 13 Home Layout: Two level;Bed/bath upstairs Home Equipment:  None      Prior Function Prior Level of Function : Independent/Modified Independent;Working/employed;Driving             Mobility Comments: Mowing yard 1 week ago       Higher education careers adviser        Extremity/Trunk Assessment   Upper Extremity Assessment Upper Extremity Assessment: Generalized weakness    Lower Extremity Assessment Lower Extremity Assessment: Generalized weakness    Cervical / Trunk Assessment Cervical / Trunk Assessment: Kyphotic  Communication   Communication: No difficulties  Cognition Arousal/Alertness: Awake/alert Behavior During Therapy: WFL for tasks assessed/performed Overall Cognitive Status: Within Functional Limits for tasks assessed                                 General Comments: Pleasant & joking throughout session.        General Comments      Exercises     Assessment/Plan    PT Assessment Patient needs continued PT services  PT Problem List Decreased strength;Cardiopulmonary status limiting activity;Decreased range of motion;Decreased activity tolerance;Decreased balance;Decreased mobility;Decreased safety awareness;Decreased knowledge of use of DME       PT Treatment Interventions Therapeutic exercise;Gait training;DME instruction;Balance training;Functional mobility training;Therapeutic activities;Patient/family education;Stair training;Neuromuscular re-education    PT Goals (Current goals can be found in the Care Plan section)  Acute Rehab PT Goals Patient Stated Goal: get better PT Goal Formulation: With patient Time For Goal Achievement: 06/08/22 Potential to Achieve Goals: Good    Frequency Min 4X/week     Co-evaluation               AM-PAC PT "6 Clicks" Mobility  Outcome Measure Help needed turning from your back to your side while in a flat bed without using bedrails?: None Help needed moving from lying on your back to sitting on the side of a flat bed without using bedrails?: A Little Help  needed moving to and from a bed to a chair (including a wheelchair)?: A Little Help needed standing up from a chair using your arms (e.g., wheelchair or bedside chair)?: A Little Help needed to walk in hospital room?: A Little Help needed climbing 3-5 steps with a railing? : A Lot 6 Click Score: 18    End of Session Equipment Utilized During Treatment: Oxygen Activity Tolerance:  (limited 2/2 bowel incontinence) Patient left: in bed;with nursing/sitter in room;with family/visitor present   PT Visit Diagnosis: Unsteadiness on feet (R26.81);Difficulty in walking, not elsewhere classified (R26.2);Muscle weakness (generalized) (M62.81);Other abnormalities of gait and mobility (R26.89)    Time: 1610-9604 PT Time Calculation (min) (ACUTE ONLY): 24 min   Charges:   PT Evaluation $PT Eval Low Complexity: 1 Low          Aleda Grana, PT, DPT 05/25/22, 2:02 PM   Sandi Mariscal 05/25/2022, 2:00 PM

## 2022-05-25 NOTE — Progress Notes (Signed)
Cardiology Progress Note   Patient Name: Antonio [name] Date of Encounter: 05/25/2022  Primary Cardiologist: Yvonne Kendall, MD  Subjective   Breathing improving.  No chest pain.  Has been having diarrhea.  Feels like strength improving but concerned that he may have to go to short term rehab.  Inpatient Medications    Scheduled Meds:  azithromycin  500 mg Oral Q24H   flecainide  75 mg Oral BID   loperamide  2 mg Oral TID   mesalamine  4.8 g Oral Q breakfast   metoprolol succinate  12.5 mg Oral Daily   mometasone-formoterol  2 puff Inhalation BID   revefenacin  175 mcg Nebulization Daily   rivaroxaban  20 mg Oral Q supper   rosuvastatin  10 mg Oral Daily   saccharomyces boulardii  250 mg Oral BID   tamsulosin  0.4 mg Oral Daily   Continuous Infusions:  cefTRIAXone (ROCEPHIN)  IV Stopped (05/24/22 1515)   PRN Meds: acetaminophen **OR** acetaminophen, albuterol, alum & mag hydroxide-simeth, guaiFENesin, HYDROcodone-acetaminophen, ondansetron **OR** ondansetron (ZOFRAN) IV   Vital Signs    Vitals:   05/24/22 2341 05/25/22 0400 05/25/22 0741 05/25/22 0752  BP: (!) 140/64 135/64 135/70   Pulse: 73 68 68   Resp: Temp: 98.4 F (36.9 C) 98.1 F (36.7 C) 97.8 F (36.6 C)   TempSrc: Axillary Oral    SpO2: 99% 99% 99% 98%  Weight:  68.5 kg    Height:        Intake/Output Summary (Last 24 hours) at 05/25/2022 1034 Last data filed at 05/25/2022 0925 Gross per 24 hour  Intake 120 ml  Output 851 ml  Net -731 ml   Filed Weights   05/22/22 1622 05/22/22 2133 05/25/22 0400  Weight: 66.7 kg 66.5 kg 68.5 kg    Physical Exam   GEN: Well nourished, well developed, in no acute distress.  HEENT: Grossly normal.  Neck: Supple, no JVD, carotid bruits, or masses. Cardiac: RRR, no murmurs, rubs, or gallops. No clubbing, cyanosis, edema.  Radials 2+, DP/PT 2+ and equal bilaterally.  Respiratory:  Respirations regular and unlabored, markedly diminished breath  sounds @ bilat bases. GI: Soft, nontender, nondistended, BS + x 4. MS: no deformity or atrophy. Skin: warm and dry, no rash. Neuro:  Strength and sensation are intact. Psych: AAOx3.  Normal affect.  Labs    Chemistry Recent Labs  Lab 05/22/22 1630 05/23/22 0541  NA 129* 134*  K 3.7 3.6  CL 97* 106  CO2 19* 23  GLUCOSE 116* 104*  BUN 13 11  CREATININE 0.88 0.72  CALCIUM 8.8* 7.8*  GFRNONAA >60 >60  ANIONGAP 13 5     Hematology Recent Labs  Lab 05/22/22 1630 05/23/22 0541  WBC 13.2* 8.5  RBC 4.91 3.71*  HGB 15.1 11.5*  HCT 44.5 34.1*  MCV 90.6 91.9  MCH 30.8 31.0  MCHC 33.9 33.7  RDW 11.6 11.7  PLT 188 155    Cardiac Enzymes  Recent Labs  Lab 05/22/22 1630 05/22/22 1909 05/24/22 0724  TROPONINIHS 15 34* 16      BNP    Component Value Date/Time   BNP 61.9 12/16/2021 1334   Lipids  Lab Results  Component Value Date   CHOL 127 02/19/2022   HDL 55 02/19/2022   LDLCALC 57 02/19/2022   TRIG 73 02/19/2022   CHOLHDL 2.3 02/19/2022    Radiology    DG Chest Port 1 View  Result Date: 05/22/2022  CLINICAL DATA:  Shortness of breath. EXAM: PORTABLE CHEST 1 VIEW COMPARISON:  12/16/2021 FINDINGS: No evidence for pulmonary edema or pneumothorax. Extreme left costophrenic sulcus has not been included on the film. Streaky opacity in the lung bases suggest atelectasis with superimposed patchy airspace disease in the medial right base concerning for pneumonia. The cardiopericardial silhouette is within normal limits for size. Telemetry leads overlie the chest. Defibrillator pads overlie the chest. IMPRESSION: Patchy airspace disease in the medial right base concerning for pneumonia. Electronically Signed   By: Kennith Center M.D.   On: 05/22/2022 16:58    Telemetry    RSR, freq PACs, occas PVCs - Personally Reviewed  Cardiac Studies   06/2021 MV: EF 65%, no isch/infarct,Cor Ca2+  02/2022 Cor CTA: LM 25, LAD 25-49, LCX nl, OM1/2 nl, RCA nondom, nl. Ca2+ = 252  (42nd%'ile).   02/2021 Echo: EF 55%, no rwma, nl RV fxn.   Patient Profile     77 y.o. 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  Assessment & Plan    1.  PAF:  Patient with a history of paroxysmal atrial fibrillation which has been controlled in the outpatient setting with flecainide 75 mg twice daily and low-dose metoprolol (dose limited by baseline bradycardia). Patient presented April 11 with a several day history of progressive malaise, dyspnea, and finding of fever, pneumonia, and sepsis. He was also in atrial fibrillation with rapid ventricular response on arrival. He subsequently converted to sinus rhythm on IV dilt, which was d/c'd 4/13.  Maintaining sinus rhythm w/ freq PACs on prior home dose of flecainide and ? blocker.  OAC w/ Xarelto.  F/u Dr. Lalla Brothers as outpt.  2. Sepsis/R basilar PNA:  breathing improving. Abx per IM.  3. Demand Ischemia/Nonobstrucive CAD:  Mild troponin elevation to a peak of 34 in the setting of rapid atrial fibrillation, sepsis/pneumonia. Recent coronary CT angiogram in January 2024 showing mild, nonobstructive CAD. In that setting, no plans for additional ischemic evaluation. Continue low-dose beta-blocker and statin. No aspirin in the setting of Xarelto therapy.   4.  Severe COPD:  Markedly diminished breath sounds on examination. Followed by Dr. Jayme Cloud in the outpatient setting. Uses oxygen at night at home and currently requiring during the day. Per medicine team.   5. Essential HTN:  stable on ? blocker.  6.  HL:  Cont statin Rx.  LDL of 57 in Jan 2024.  Signed, Nicolasa Ducking, NP  05/25/2022, 10:34 AM    For questions or updates, please contact   Please consult [email or web address] for contact info under Cardiology/STEMI.

## 2022-05-25 NOTE — Evaluation (Signed)
Occupational Therapy Evaluation Patient Details Name: Antonio [name] MRN: [id number] DOB: 16-Jan-1946 Today's Date: 05/25/2022   History of Present Illness Pt is a 77 y/o M admitted on 05/22/22 after presenting to the ED with c/o SOB & cough x 1 week. On arrival pt found to be in a-fib with RVR. Pt also being treated for sepsis 2/2 CAP. PMH: severe COPD with nocturnal hypoxemia using 2L supplemental O2, paroxysmal a-fib, SVT, PAD s/p L SFA stent, HTN, ulcerative colitis, BPH   Clinical Impression   Patient agreeable to OT evaluation. Spouse present. Pt presenting with decreased independence in self care, balance, functional mobility/transfers, and endurance. PTA pt was independent for ADLs, IADLs, and functional mobility without an AD. Pt currently functioning at supervision for bed mobility, Min A for simulated toilet transfer using a RW, CGA-Min A for use of urinal in standing, and Min guard for sinkside grooming tasks. Pt c/o SOB with activity this date. SpO2 maintained >90% on 3.5L O2 via [place]. Seated rest break provided once pt sitting EOB and R lateral lean noted with time while OT asking PLOF/history questions. Pt will benefit from skilled acute OT services to address deficits noted below. OT recommends ongoing therapy upon discharge to maximize safety and independence with ADLs, decrease fall risk, decrease caregiver burden, and promote return to PLOF.    Recommendations for follow up therapy are one component of a multi-disciplinary discharge planning process, led by the attending physician.  Recommendations may be updated based on patient status, additional functional criteria and insurance authorization.   Assistance Recommended at Discharge Intermittent Supervision/Assistance  Patient can return home with the following A little help with walking and/or transfers;A little help with bathing/dressing/bathroom;Assistance with cooking/housework;Assist for transportation;Help with stairs or  ramp for entrance    Functional Status Assessment  Patient has had a recent decline in their functional status and demonstrates the ability to make significant improvements in function in a reasonable and predictable amount of time.   Equipment Recommendations  BSC/3in1    Recommendations for Other Services       Precautions / Restrictions Precautions Precautions: Fall Restrictions Weight Bearing Restrictions: No      Mobility Bed Mobility Overal bed mobility: Needs Assistance Bed Mobility: Supine to Sit, Sit to Supine     Supine to sit: HOB elevated, Supervision Sit to supine: Supervision   General bed mobility comments: Pt was able to scoot himself up toward [facility] using bed features/hand rails    Transfers Overall transfer level: Needs assistance Equipment used: Rolling walker (2 wheels) Transfers: Sit to/from Stand Sit to Stand: Min assist                  Balance Overall balance assessment: Needs assistance Sitting-balance support: Feet supported Sitting balance-Leahy Scale: Fair Sitting balance - Comments: Pt with increasing R lateral lean as pt sits for prolonged time Postural control: Right lateral lean Standing balance support: During functional activity, Bilateral upper extremity supported, Single extremity supported Standing balance-Leahy Scale: Fair Standing balance comment: BUE and single UE support during sinkside grooming task (RW and edge of sink)           ADL either performed or assessed with clinical judgement   ADL Overall ADL's : Needs assistance/impaired     Grooming: Min guard;Standing;Brushing hair Grooming Details (indicate cue type and reason): pt utilizing edge of sink for UE support, Min guard for safety 2/2 unsteadiness and SOB         Toilet Transfer: Minimal assistance;Rolling  walker (2 wheels) Toilet Transfer Details (indicate cue type and reason): simulated with STS from EOB  Toileting- Clothing Manipulation and  Hygiene: Minimal assistance;Min guard;Sit to/from stand Toileting - Clothing Manipulation Details (indicate cue type and reason): pt requesting to use urinal in standing while at sink       General ADL Comments: Pt functionally limited by decreased endurance and SOB this date.     Vision Patient Visual Report: No change from baseline       Perception     Praxis      Pertinent Vitals/Pain Pain Assessment Pain Assessment: Faces Faces Pain Scale: Hurts a little bit Pain Location: low back Pain Descriptors / Indicators: Aching, Discomfort Pain Intervention(s): Monitored during session, Repositioned, Limited activity within patient's tolerance     Hand Dominance     Extremity/Trunk Assessment Upper Extremity Assessment Upper Extremity Assessment: Generalized weakness   Lower Extremity Assessment Lower Extremity Assessment: Generalized weakness   Cervical / Trunk Assessment Cervical / Trunk Assessment: Kyphotic   Communication Communication Communication: No difficulties   Cognition Arousal/Alertness: Awake/alert Behavior During Therapy: WFL for tasks assessed/performed Overall Cognitive Status: Within Functional Limits for tasks assessed           General Comments  Pt received on 3.5L O2 via [place]. Pt endorsed SOB with activity, however, SpO2 maintained >90%. VC for PLB, seated rest break provided.    Exercises Other Exercises Other Exercises: OT provided education re: role of OT, OT POC, post acute recs, sitting up for all meals, EOB/OOB mobility with assistance, home/fall safety, energy conservation techniques (PLB, rest breaks)   Shoulder Instructions      Home Living Family/patient expects to be discharged to:: Private residence Living Arrangements: Spouse/significant other Available Help at Discharge: Family Type of Home: House Home Access: Stairs to enter Secretary/administrator of Steps: 4 Entrance Stairs-Rails: Right Home Layout: Two level;Bed/bath  upstairs Alternate Level Stairs-Number of Steps: 13   Bathroom Shower/Tub: Producer, television/film/video: Handicapped height     Home Equipment: None          Prior Functioning/Environment Prior Level of Function : Independent/Modified Independent;Working/employed;Driving             Mobility Comments: Mowing yard 1 week ago ADLs Comments: Independent, still driving        OT Problem List: Decreased strength;Cardiopulmonary status limiting activity;Pain;Decreased activity tolerance;Impaired balance (sitting and/or standing);Decreased safety awareness;Decreased knowledge of use of DME or AE      OT Treatment/Interventions: Self-care/ADL training;Therapeutic exercise;Neuromuscular education;Energy conservation;DME and/or AE instruction;Manual therapy;Modalities;Balance training;Patient/family education;Visual/perceptual remediation/compensation;Cognitive remediation/compensation;Therapeutic activities;Splinting    OT Goals(Current goals can be found in the care plan section) Acute Rehab OT Goals Patient Stated Goal: return to PLOF OT Goal Formulation: With patient/family Time For Goal Achievement: 06/08/22 Potential to Achieve Goals: Good   OT Frequency: Min 3X/week    Co-evaluation              AM-PAC OT "6 Clicks" Daily Activity     Outcome Measure Help from another person eating meals?: None Help from another person taking care of personal grooming?: A Little Help from another person toileting, which includes using toliet, bedpan, or urinal?: A Little Help from another person bathing (including washing, rinsing, drying)?: A Lot Help from another person to put on and taking off regular upper body clothing?: A Little Help from another person to put on and taking off regular lower body clothing?: A Little 6 Click Score: 18   End of Session  Equipment Utilized During Treatment: Rolling walker (2 wheels);Oxygen Nurse Communication: Mobility status  Activity  Tolerance: Patient limited by fatigue;Patient tolerated treatment well Patient left: in bed;with call bell/phone within reach;with bed alarm set;with family/visitor present  OT Visit Diagnosis: Unsteadiness on feet (R26.81);Muscle weakness (generalized) (M62.81);Pain Pain - part of body:  (back)                Time: 1610-9604 OT Time Calculation (min): 15 min Charges:  OT General Charges $OT Visit: 1 Visit OT Evaluation $OT Eval Low Complexity: 1 Low  [facility] MS, OTR/L ascom [phone]  05/25/22, 6:30 PM

## 2022-05-25 NOTE — Progress Notes (Signed)
Inpatient Rehab Admissions Coordinator:  ? ?Per therapy recommendations,  patient was screened for CIR candidacy by Talayia Hjort, MS, CCC-SLP. At this time, Pt. Appears to be a a potential candidate for CIR. I will place   order for rehab consult per protocol for full assessment. Please contact me any with questions. ? ?Geraldo Haris, MS, CCC-SLP ?Rehab Admissions Coordinator  ?[phone] (celll) ?[phone] (office) ? ?

## 2022-05-26 DIAGNOSIS — I4891 Unspecified atrial fibrillation: Secondary | ICD-10-CM | POA: Diagnosis not present

## 2022-05-26 LAB — CBC
HCT: 35.1 % — ABNORMAL LOW (ref 39.0–52.0)
Hemoglobin: 11.4 g/dL — ABNORMAL LOW (ref 13.0–17.0)
MCH: 30.5 pg (ref 26.0–34.0)
MCHC: 32.5 g/dL (ref 30.0–36.0)
MCV: 93.9 fL (ref 80.0–100.0)
Platelets: 195 10*3/uL (ref 150–400)
RBC: 3.74 MIL/uL — ABNORMAL LOW (ref 4.22–5.81)
RDW: 11.5 % (ref 11.5–15.5)
WBC: 8.6 10*3/uL (ref 4.0–10.5)
nRBC: 0 % (ref 0.0–0.2)

## 2022-05-26 LAB — COMPREHENSIVE METABOLIC PANEL
ALT: 15 U/L (ref 0–44)
AST: 24 U/L (ref 15–41)
Albumin: 2.7 g/dL — ABNORMAL LOW (ref 3.5–5.0)
Alkaline Phosphatase: 91 U/L (ref 38–126)
Anion gap: 7 (ref 5–15)
BUN: 6 mg/dL — ABNORMAL LOW (ref 8–23)
CO2: 29 mmol/L (ref 22–32)
Calcium: 8.3 mg/dL — ABNORMAL LOW (ref 8.9–10.3)
Chloride: 101 mmol/L (ref 98–111)
Creatinine, Ser: 0.72 mg/dL (ref 0.61–1.24)
GFR, Estimated: >60 mL/min (ref >60)
Glucose, Bld: 111 mg/dL — ABNORMAL HIGH (ref 70–99)
Potassium: 3.1 mmol/L — ABNORMAL LOW (ref 3.5–5.1)
Sodium: 137 mmol/L (ref 135–145)
Total Bilirubin: 0.7 mg/dL (ref 0.3–1.2)
Total Protein: 6.2 g/dL — ABNORMAL LOW (ref 6.5–8.1)

## 2022-05-26 LAB — PHOSPHORUS: Phosphorus: 2.4 mg/dL — ABNORMAL LOW (ref 2.5–4.6)

## 2022-05-26 LAB — CULTURE, BLOOD (ROUTINE X 2): Culture: NO GROWTH

## 2022-05-26 LAB — MAGNESIUM: Magnesium: 2 mg/dL (ref 1.7–2.4)

## 2022-05-26 MED ORDER — POTASSIUM CHLORIDE 20 MEQ PO PACK
40.0000 meq | PACK | Freq: Once | ORAL | Status: AC
  Administered 2022-05-26: 40 meq via ORAL
  Filled 2022-05-26: qty 2

## 2022-05-26 MED ORDER — LOPERAMIDE HCL 2 MG PO CAPS
2.0000 mg | ORAL_CAPSULE | Freq: Once | ORAL | Status: AC
  Administered 2022-05-26: 2 mg via ORAL
  Filled 2022-05-26: qty 1

## 2022-05-26 MED ORDER — POTASSIUM PHOSPHATES 15 MMOLE/5ML IV SOLN
30.0000 mmol | Freq: Once | INTRAVENOUS | Status: AC
  Administered 2022-05-26: 30 mmol via INTRAVENOUS
  Filled 2022-05-26: qty 10

## 2022-05-26 MED ORDER — POLYVINYL ALCOHOL 1.4 % OP SOLN
2.0000 [drp] | OPHTHALMIC | Status: DC | PRN
  Administered 2022-05-26: 2 [drp] via OPHTHALMIC
  Filled 2022-05-26: qty 15

## 2022-05-26 NOTE — Care Management Important Message (Signed)
Important Message  Patient Details  Name: Antonio [name] MRN: [id number] Date of Birth: 07-19-1945   Medicare Important Message Given:  Yes     Johnell Comings 05/26/2022, 12:04 PM

## 2022-05-26 NOTE — Progress Notes (Signed)
Rounding Note    Patient Name: Antonio [name] Date of Encounter: 05/26/2022  [facility] Cardiologist: Yvonne Kendall, MD   Subjective   Patient seen on AM rounds. Denies any chest pain and breathing has improved. Currently on 3L O2 via .   Inpatient Medications    Scheduled Meds:  azithromycin  500 mg Oral Q24H   flecainide  75 mg Oral BID   mesalamine  4.8 g Oral Q breakfast   metoprolol succinate  12.5 mg Oral Daily   mometasone-formoterol  2 puff Inhalation BID   potassium chloride  40 mEq Oral Once   revefenacin  175 mcg Nebulization Daily   rivaroxaban  20 mg Oral Q supper   rosuvastatin  10 mg Oral Daily   saccharomyces boulardii  250 mg Oral BID   tamsulosin  0.4 mg Oral Daily   Continuous Infusions:  cefTRIAXone (ROCEPHIN)  IV Stopped (05/25/22 1823)   potassium PHOSPHATE IVPB (in mmol)     PRN Meds: acetaminophen **OR** acetaminophen, albuterol, alum & mag hydroxide-simeth, guaiFENesin, HYDROcodone-acetaminophen, ondansetron **OR** ondansetron (ZOFRAN) IV, sodium chloride   Vital Signs    Vitals:   05/26/22 0010 05/26/22 0309 05/26/22 0747 05/26/22 0807  BP: (!) 141/68 125/60 138/66   Pulse: 78 74 69   Resp: 16 16 16    Temp: 99 F (37.2 C) 98.7 F (37.1 C) 98.1 F (36.7 C)   TempSrc: Oral Oral Oral   SpO2: 96% 96% 98% 97%  Weight:      Height:        Intake/Output Summary (Last 24 hours) at 05/26/2022 0930 Last data filed at 05/26/2022 0550 Gross per 24 hour  Intake 720 ml  Output 450 ml  Net 270 ml      05/25/2022    4:00 AM 05/22/2022    9:33 PM 05/22/2022    4:22 PM  Last 3 Weights  Weight (lbs) 151 lb 146 lb 9.7 oz 147 lb  Weight (kg) 68.493 kg 66.5 kg 66.679 kg      Telemetry    Sinus rhythm rate 60-70 with unifocal PVCs occasional trigeminy- Personally Reviewed  ECG    No new tracings- Personally Reviewed  Physical Exam   GEN: No acute distress.   Neck: No JVD Cardiac: RRR, no murmurs, rubs, or gallops.   Respiratory: Clear upper lobes with significantly diminished bases to auscultation bilaterally.  Rations are unlabored on 3 L of O2 via nasal cannula at rest GI: Soft, nontender, non-distended  MS: No edema; No deformity. Neuro:  Nonfocal  Psych: Normal affect   Labs    High Sensitivity Troponin:   Recent Labs  Lab 05/22/22 1630 05/22/22 1909 05/24/22 0724  TROPONINIHS 15 34* 16     Chemistry Recent Labs  Lab 05/22/22 1630 05/23/22 0541 05/26/22 0331  NA 129* 134* 137  K 3.7 3.6 3.1*  CL 97* 106 101  CO2 19* 23 29  GLUCOSE 116* 104* 111*  BUN 13 11 6*  CREATININE 0.88 0.72 0.72  CALCIUM 8.8* 7.8* 8.3*  MG  --   --  2.0  PROT  --   --  6.2*  ALBUMIN  --   --  2.7*  AST  --   --  24  ALT  --   --  15  ALKPHOS  --   --  91  BILITOT  --   --  0.7  GFRNONAA >60 >60 >60  ANIONGAP 13 5 7     Lipids No  results for input(s): "CHOL", "TRIG", "HDL", "LABVLDL", "LDLCALC", "CHOLHDL" in the last 168 hours.  Hematology Recent Labs  Lab 05/22/22 1630 05/23/22 0541 05/26/22 0331  WBC 13.2* 8.5 8.6  RBC 4.91 3.71* 3.74*  HGB 15.1 11.5* 11.4*  HCT 44.5 34.1* 35.1*  MCV 90.6 91.9 93.9  MCH 30.8 31.0 30.5  MCHC 33.9 33.7 32.5  RDW 11.6 11.7 11.5  PLT 188 155 195   Thyroid No results for input(s): "TSH", "FREET4" in the last 168 hours.  BNPNo results for input(s): "BNP", "PROBNP" in the last 168 hours.  DDimer No results for input(s): "DDIMER" in the last 168 hours.   Radiology    No results found.  Cardiac Studies  Coronary CTA 03/06/22 IMPRESSION: 1. Coronary calcium score of 251. This was 42nd percentile for age and sex matched control.   2. Normal coronary origin with left dominance.   3. Mild distal LM and proximal LAD stenosis (25-49%)   4. CAD-RADS 2. Mild non-obstructive CAD (25-49%). Consider non-atherosclerotic causes of chest pain. Consider preventive therapy and risk factor modification.    Patient Profile     77 y.o. male with past medical  history of PAF/flutter on flecainide and rivaroxaban, nonobstructive coronary artery disease, severe COPD, PSVT, PAD status post left SFA stenting, hypertension, hyperlipidemia, ulcerative colitis, BPH, who was admitted on 411 with acute on chronic respiratory failure, pneumonia, and atrial fibrillation RVR that has since converted to sinus.  Assessment & Plan    Paroxysmal atrial fibrillation atrial flutter -Longstanding history of paroxysmal atrial fibrillation we have been controlled in the outpatient setting with flecainide and low-dose metoprolol -After 7-day history of progressive malaise, dyspnea and fever with pneumonia and sepsis he was in atrial fibrillation with RVR on arrival to the emergency department -Converted to sinus rhythm on IV diltiazem which was discontinued on 4/13 -Continued on flecainide and beta-blocker therapy -Continued on rivaroxaban for stroke prophylaxis -Continue with cardiac monitoring -Continue outpatient follow-up with Dr. Lalla Brothers from EP  Demand ischemia/nonobstructive coronary artery disease -Mild troponin elevation with a peak of 34 -In the setting of rapid atrial fibrillation, sepsis, pneumonia -Recent coronary CT angiogram in January 2024 showed mild, nonobstructive CAD -Continued on low-dose beta-blocker and statin therapy -Rivaroxaban and lieu of aspirin therapy  Hypokalemia -Serum potassium 3.1 -Potassium supplementation ordered -Patient stated he had large quantity of diarrhea overnight which likely contributed to hypokalemia -Recommended to keep potassium>4 and <5 -Daily BMP -Monitor/trend/replete electrolytes as needed  Sepsis/right basilar pneumonia -Breathing improving -Antibiotics per IM -Titrate FiO2 as tolerated to keep oxygen saturations greater than equal to 92% -Incentive spirometer  Severe COPD -Diminished breath sounds on exam -Follows with pulmonary Dr. Jayme Cloud as an outpatient -Currently on 3 L of O2 via nasal  cannula -Supportive care -Management per IM  Essential hypertension -Blood pressure 119/63 -Blood pressure remained stable -Vital signs per unit protocol  Hyperlipidemia -LDL 50 18 July 2022 -Continue on statin therapy     For questions or updates, please contact [facility] Please consult [email or web address] for contact info under        Signed, Fernande Treiber, NP  05/26/2022, 9:30 AM

## 2022-05-26 NOTE — Progress Notes (Signed)
Physical Therapy Treatment Patient Details Name: Antonio [name] MRN: [id number] DOB: 11-Sep-1945 Today's Date: 05/26/2022   History of Present Illness Pt is a 77 y/o M admitted on 05/22/22 after presenting to the ED with c/o SOB & cough x 1 week. On arrival pt found to be in a-fib with RVR. Pt also being treated for sepsis 2/2 CAP. PMH: severe COPD with nocturnal hypoxemia using 2L supplemental O2, paroxysmal a-fib, SVT, PAD s/p L SFA stent, HTN, ulcerative colitis, BPH    PT Comments    Pt seen for PT tx with pt agreeable. Pt is making steady progress with functional mobility. On this date, pt is able to ambulate 1 lap around nurses station with CGA but requires RW which is different from his baseline of independent without AD. Pt notes 6/10 fatigue after task but with rest, is able to engage in endurance/strengthening activity. Pt remains great candidate for intensive rehab program to facilitate quicker return to independent PLOF. Pt is pleasant, motivated to get better, and has good support system (wife).    Recommendations for follow up therapy are one component of a multi-disciplinary discharge planning process, led by the attending physician.  Recommendations may be updated based on patient status, additional functional criteria and insurance authorization.  Follow Up Recommendations       Assistance Recommended at Discharge Intermittent Supervision/Assistance  Patient can return home with the following A little help with walking and/or transfers;A little help with bathing/dressing/bathroom;Assistance with cooking/housework;Assist for transportation;Help with stairs or ramp for entrance   Equipment Recommendations  Rolling walker (2 wheels);BSC/3in1    Recommendations for Other Services       Precautions / Restrictions Precautions Precautions: Fall Restrictions Weight Bearing Restrictions: No     Mobility  Bed Mobility Overal bed mobility: Modified Independent Bed  Mobility: Supine to Sit     Supine to sit: Modified independent (Device/Increase time), HOB elevated Sit to supine: Modified independent (Device/Increase time), HOB elevated        Transfers   Equipment used: Rolling walker (2 wheels) Transfers: Sit to/from Stand Sit to Stand: Min guard (extra effort to power to standing from EOB with cuing for hand placement during STS with RW)                Ambulation/Gait Ambulation/Gait assistance: Min guard Gait Distance (Feet): 150 Feet Assistive device: Rolling walker (2 wheels) Gait Pattern/deviations: Decreased stride length Gait velocity: education to pace himself to prevent SOB/fatigue     General Gait Details: Pt with trunk flexed, pushing RW slightly out in front of him.   Stairs             Wheelchair Mobility    Modified Rankin (Stroke Patients Only)       Balance Overall balance assessment: Needs assistance Sitting-balance support: Feet supported Sitting balance-Leahy Scale: Fair     Standing balance support: Bilateral upper extremity supported, During functional activity, Reliant on assistive device for balance Standing balance-Leahy Scale: Fair                              Cognition Arousal/Alertness: Awake/alert Behavior During Therapy: WFL for tasks assessed/performed Overall Cognitive Status: Within Functional Limits for tasks assessed                                 General Comments: Pleasant & joking throughout session.  Exercises Other Exercises Other Exercises: Pt performed 5x STS from EOB with progressively lowering EOB height without BUE support & CGA<>min assist with activity focusing on endurance training & generalized strengthening. Pt requires brief rest breaks throughout activity. Other Exercises: PT provided pt with incentive spirometer & educated pt on it's use. Pt with good return demo, achieving max 750 mL.    General Comments General  comments (skin integrity, edema, etc.): Pt on 3L/min via nasal cannula with SpO2 >90% throughout session, PT educates pt on pursed lip breathing.      Pertinent Vitals/Pain Pain Assessment Pain Assessment: No/denies pain    Home Living                          Prior Function            PT Goals (current goals can now be found in the care plan section) Acute Rehab PT Goals Patient Stated Goal: get better PT Goal Formulation: With patient Time For Goal Achievement: 06/08/22 Potential to Achieve Goals: Good Progress towards PT goals: Progressing toward goals    Frequency    Min 4X/week      PT Plan Current plan remains appropriate    Co-evaluation              AM-PAC PT "6 Clicks" Mobility   Outcome Measure  Help needed turning from your back to your side while in a flat bed without using bedrails?: None Help needed moving from lying on your back to sitting on the side of a flat bed without using bedrails?: A Little Help needed moving to and from a bed to a chair (including a wheelchair)?: A Little Help needed standing up from a chair using your arms (e.g., wheelchair or bedside chair)?: A Little Help needed to walk in hospital room?: A Little Help needed climbing 3-5 steps with a railing? : A Lot 6 Click Score: 18    End of Session Equipment Utilized During Treatment: Oxygen Activity Tolerance: Patient limited by fatigue Patient left: in bed;with call bell/phone within reach;with bed alarm set;with nursing/sitter in room Nurse Communication:  (PT gave pt incentive spirometer) PT Visit Diagnosis: Unsteadiness on feet (R26.81);Difficulty in walking, not elsewhere classified (R26.2);Muscle weakness (generalized) (M62.81);Other abnormalities of gait and mobility (R26.89)     Time: 1610-9604 PT Time Calculation (min) (ACUTE ONLY): 25 min  Charges:  $Therapeutic Activity: 23-37 mins                     Aleda Grana, PT, DPT 05/26/22, 9:56  AM   Sandi Mariscal 05/26/2022, 9:54 AM

## 2022-05-26 NOTE — Progress Notes (Signed)
Inpatient Rehab Coordinator Note:  I spoke with spouse via phone to discuss CIR recommendations and goals/expectations of CIR stay.  We reviewed 3 hrs/day of therapy, physician follow up, and average length of stay 2 weeks (dependent upon progress) with goals of mod I.  Wife is in agreement to pursue CIR admission. Will send information to [facility] for prior approval. Continue to follow.   Rehab Admissons Coordinator [place], [place], [place] [phone]

## 2022-05-26 NOTE — Plan of Care (Signed)

## 2022-05-26 NOTE — Progress Notes (Signed)
Occupational Therapy Treatment Patient Details Name: Antonio [name] MRN: [id number] DOB: November 05, 1945 Today's Date: 05/26/2022   History of present illness Pt is a 77 y/o M admitted on 05/22/22 after presenting to the ED with c/o SOB & cough x 1 week. On arrival pt found to be in a-fib with RVR. Pt also being treated for sepsis 2/2 CAP. PMH: severe COPD with nocturnal hypoxemia using 2L supplemental O2, paroxysmal a-fib, SVT, PAD s/p L SFA stent, HTN, ulcerative colitis, BPH   OT comments  Patient received sitting up in bed and agreeable to OT. Tx session targeted increasing activity tolerance for improved ADL completion. Pt completed bed mobility with Mod I this date. He then completed functional mobility to the bathroom with Min guard using a RW. Pt with continent void on toilet and then engaged in sinkside grooming tasks with Min guard for safety. Pt endorsed SOB with activity. SpO2 maintained >90% with activity on 3L O2 via [place]. Education provided re: pursed lip breathing, activity pacing, and taking rest breaks. Pt left as received with all needs in reach. Pt is making progress toward goal completion. D/C recommendation remains appropriate. OT will continue to follow acutely.    Recommendations for follow up therapy are one component of a multi-disciplinary discharge planning process, led by the attending physician.  Recommendations may be updated based on patient status, additional functional criteria and insurance authorization.    Assistance Recommended at Discharge Intermittent Supervision/Assistance  Patient can return home with the following  A little help with walking and/or transfers;A little help with bathing/dressing/bathroom;Assistance with cooking/housework;Assist for transportation;Help with stairs or ramp for entrance   Equipment Recommendations  BSC/3in1    Recommendations for Other Services      Precautions / Restrictions Precautions Precautions:  Fall Restrictions Weight Bearing Restrictions: No       Mobility Bed Mobility Overal bed mobility: Modified Independent Bed Mobility: Supine to Sit, Sit to Supine     Supine to sit: Modified independent (Device/Increase time), HOB elevated Sit to supine: Modified independent (Device/Increase time), HOB elevated        Transfers Overall transfer level: Needs assistance Equipment used: Rolling walker (2 wheels) Transfers: Sit to/from Stand Sit to Stand: Min guard                 Balance Overall balance assessment: Needs assistance Sitting-balance support: Feet supported Sitting balance-Leahy Scale: Fair     Standing balance support: Bilateral upper extremity supported, During functional activity, Reliant on assistive device for balance Standing balance-Leahy Scale: Fair         ADL either performed or assessed with clinical judgement   ADL Overall ADL's : Needs assistance/impaired     Grooming: Min guard;Standing;Wash/dry face;Oral care                   Toilet Transfer: Ambulation;Min guard;Regular Toilet;Rolling walker (2 wheels);Grab bars   Toileting- Clothing Manipulation and Hygiene: Min guard;Sit to/from stand       Functional mobility during ADLs: Min guard;Rolling walker (2 wheels) (~58ft to the bathroom)      Extremity/Trunk Assessment Upper Extremity Assessment Upper Extremity Assessment: Generalized weakness   Lower Extremity Assessment Lower Extremity Assessment: Generalized weakness   Cervical / Trunk Assessment Cervical / Trunk Assessment: Kyphotic    Vision Patient Visual Report: No change from baseline     Perception     Praxis      Cognition Arousal/Alertness: Awake/alert Behavior During Therapy: WFL for tasks assessed/performed Overall Cognitive Status:  Within Functional Limits for tasks assessed            Exercises      Shoulder Instructions       General Comments Pt on 3L O2 via [place] t/o session, SpO2  maintained >90% with activity. VC for PLB.    Pertinent Vitals/ Pain       Pain Assessment Pain Assessment: No/denies pain  Home Living        Prior Functioning/Environment              Frequency  Min 3X/week        Progress Toward Goals  OT Goals(current goals can now be found in the care plan section)  Progress towards OT goals: Progressing toward goals  Acute Rehab OT Goals Patient Stated Goal: return to PLOF OT Goal Formulation: With patient/family Time For Goal Achievement: 06/08/22 Potential to Achieve Goals: Good  Plan Discharge plan remains appropriate;Other (comment)    Co-evaluation                 AM-PAC OT "6 Clicks" Daily Activity     Outcome Measure   Help from another person eating meals?: None Help from another person taking care of personal grooming?: A Little Help from another person toileting, which includes using toliet, bedpan, or urinal?: A Little Help from another person bathing (including washing, rinsing, drying)?: A Lot Help from another person to put on and taking off regular upper body clothing?: A Little Help from another person to put on and taking off regular lower body clothing?: A Little 6 Click Score: 18    End of Session Equipment Utilized During Treatment: Rolling walker (2 wheels);Oxygen  OT Visit Diagnosis: Unsteadiness on feet (R26.81);Muscle weakness (generalized) (M62.81);Pain   Activity Tolerance Patient limited by fatigue;Patient tolerated treatment well   Patient Left in bed;with call bell/phone within reach;with bed alarm set   Nurse Communication Mobility status        Time: 1100-1117 OT Time Calculation (min): 17 min  Charges: OT General Charges $OT Visit: 1 Visit OT Treatments $Self Care/Home Management : 8-22 mins  [facility] MS, OTR/L ascom [phone]  05/26/22, 12:55 PM

## 2022-05-26 NOTE — Progress Notes (Signed)
PROGRESS NOTE    Antonio [name]  XTG:[id number] DOB: [birth year] DOA: 05/22/2022 PCP: Duanne Limerick, MD   Brief Narrative:  This 77 yrs old Male with PMH significant for severe COPD with nocturnal hypoxemia using 2 L of supplemental oxygen, paroxysmal A-fib on Xarelto and flecainide, not a candidate for invasive procedures due to severe lung disease, SVT, PAD s/p left SFA stent, hypertension, ulcerative colitis, BPH with a history of acute urinary retention requiring Foley presented in the ED with complaints of shortness of breath and cough for 1 week.  Patient reports cough is progressively getting worse and has developed chest pressure and fatigue.  On arrival in the ED he was hypertensive, tachycardic, tachypneic and febrile.EKG shows A-fib with RVR with heart rate 185.  WBC 13 K, lactic acid 2.7, troponin 15> 34 RVP negative.Chest x-ray reveals patchy airspace disease concerning for pneumonia.  Patient was given a dose of flecainide and started on Cardizem infusion and heart rate  improved. Patient is admitted for sepsis secondary to pneumonia and started on empiric antibiotics ( Vancomycin,  Flagyl and cefepime ).  Assessment & Plan:   Principal Problem:   Atrial fibrillation with rapid ventricular response Active Problems:   CAP (community acquired pneumonia)   Sepsis due to pneumonia   Stage 3 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   Nocturnal hypoxemia   Ulcerative colitis   PAD ss/p SFA stent(peripheral artery disease)   BPH (benign prostatic hyperplasia)   History of urinary retention   Demand ischemia   Pure hypercholesterolemia   Primary hypertension  Atrial fibrillation with RVR: Likely secondary to pneumonia with sepsis. Heart rate improved after patient was started on Cardizem infusion. Continue flecainide 75 mg every 12 hours Continue Xarelto 20 mg po daily. Continue metoprolol 12.5 mg daily. Cardiology is consulted.  Not to escalate the dose of metoprolol due to  bradycardia at bedside. Heart Rate remained controlled.  Cardizem infusion discontinued. Continue flecainide, metoprolol and Xarelto.  Sepsis : Community-acquired pneumonia: Patient presented with fever, tachycardia, tachypnea, lactic acid 2.7. Chest x-ray showed patchy airspace disease concerning for pneumonia. Continue IV fluid resuscitation. Lactic acid improved with IV hydration Continue ceftriaxone and Zithromax x 5 days. Continue antitussives,  add flutter valve and incentive spirometry Continue supplemental oxygen and wean as tolerated.  COPD stage III by Gold classification: Nocturnal hypoxemia: Patient regularly follows up with pulmonologist. Continue supplemental oxygen and wean as tolerated. Continue DuoNeb as needed. Dr. Jayme Cloud consult appreciated.  Patient is started on inhalers.   BPH: Continue Flomax 0.4 mg daily.   PAD s/p SFA stent(peripheral artery disease): Continue Xarelto and rosuvastatin.   Ulcerative colitis: No acute issues. Continue mesalamine.  Diarrhea: Patient reports loose watery stools since yesterday.   Differential could include stress, antibiotic induced, ulcerative colitis flare.   Patient denies any bleeding or smell in the stool.   Patient denies fever, no leukocytosis no need to check C. difficile. Continue Imodium as needed.   DVT prophylaxis: Xarelto Code Status: Full code Family Communication:No family at bed side Disposition Plan:   Status is: Inpatient Remains inpatient appropriate because: Admitted for sepsis secondary to community-acquired pneumonia and A-fib with RVR.  Cardiology and pulmonology consulted.  PT and OT pending.   Consultants:  Cardiology Pulmonology  Procedures: None  Antimicrobials:  Anti-infectives (From admission, onward)    Start     Dose/Rate Route Frequency Ordered Stop   05/24/22 2200  azithromycin (ZITHROMAX) tablet 500 mg        500  mg Oral Every 24 hours 05/24/22 0908 05/27/22 2159    05/23/22 1500  cefTRIAXone (ROCEPHIN) 2 g in sodium chloride 0.9 % 100 mL IVPB        2 g 200 mL/hr over 30 Minutes Intravenous Every 24 hours 05/23/22 0959 05/27/22 1459   05/23/22 0100  ceFEPIme (MAXIPIME) 2 g in sodium chloride 0.9 % 100 mL IVPB  Status:  Discontinued        2 g 200 mL/hr over 30 Minutes Intravenous Every 8 hours 05/22/22 2127 05/23/22 0959   05/22/22 2300  azithromycin (ZITHROMAX) 500 mg in sodium chloride 0.9 % 250 mL IVPB  Status:  Discontinued        500 mg 250 mL/hr over 60 Minutes Intravenous Every 24 hours 05/22/22 2042 05/24/22 0908   05/22/22 1645  ceFEPIme (MAXIPIME) 2 g in sodium chloride 0.9 % 100 mL IVPB        2 g 200 mL/hr over 30 Minutes Intravenous  Once 05/22/22 1643 05/22/22 1736   05/22/22 1645  metroNIDAZOLE (FLAGYL) IVPB 500 mg        500 mg 100 mL/hr over 60 Minutes Intravenous  Once 05/22/22 1643 05/22/22 2031   05/22/22 1645  vancomycin (VANCOCIN) IVPB 1000 mg/200 mL premix        1,000 mg 200 mL/hr over 60 Minutes Intravenous  Once 05/22/22 1643 05/22/22 1856      Subjective: Patient was seen and examined at bedside. Overnight events noted.   Patient reports doing better, feels weak after he developed loose watery stools. He denies any blood in the stools or foul smell. He remains on 3 L of supplemental oxygen.  Objective: Vitals:   05/26/22 0010 05/26/22 0309 05/26/22 0747 05/26/22 0807  BP: (!) 141/68 125/60 138/66   Pulse: 78 74 69   Resp: Temp: 99 F (37.2 C) 98.7 F (37.1 C) 98.1 F (36.7 C)   TempSrc: Oral Oral Oral   SpO2: 96% 96% 98% 97%  Weight:      Height:        Intake/Output Summary (Last 24 hours) at 05/26/2022 1107 Last data filed at 05/26/2022 0550 Gross per 24 hour  Intake 480 ml  Output 450 ml  Net 30 ml   Filed Weights   05/22/22 1622 05/22/22 2133 05/25/22 0400  Weight: 66.7 kg 66.5 kg 68.5 kg    Examination:  General exam: Appears comfortable, deconditioned, not in any  distress. Respiratory system: CTA bilaterally, respiratory for normal, RR 15. Cardiovascular system: S1 & S2 heard, irregular rhythm, no murmur. Gastrointestinal system: Abdomen is soft, non distended, non tender, BS+ Central nervous system: Alert and oriented x 3, no focal neurological deficits. Extremities: No edema, no cyanosis, no clubbing Skin: No rashes, lesions or ulcers Psychiatry: Judgement and insight appear normal. Mood & affect appropriate.     Data Reviewed: I have personally reviewed following labs and imaging studies  CBC: Recent Labs  Lab 05/22/22 1630 05/23/22 0541 05/26/22 0331  WBC 13.2* 8.5 8.6  HGB 15.1 11.5* 11.4*  HCT 44.5 34.1* 35.1*  MCV 90.6 91.9 93.9  PLT 188 155 195   Basic Metabolic Panel: Recent Labs  Lab 05/22/22 1630 05/23/22 0541 05/26/22 0331  NA 129* 134* 137  K 3.7 3.6 3.1*  CL 97* 106 101  CO2 19* 23 29  GLUCOSE 116* 104* 111*  BUN 13 11 6*  CREATININE 0.88 0.72 0.72  CALCIUM 8.8* 7.8* 8.3*  MG  --   --  2.0  PHOS  --   --  2.4*   GFR: Estimated Creatinine Clearance: 72.5 mL/min (by C-G formula based on SCr of 0.72 mg/dL). Liver Function Tests: Recent Labs  Lab 05/26/22 0331  AST 24  ALT 15  ALKPHOS 91  BILITOT 0.7  PROT 6.2*  ALBUMIN 2.7*   No results for input(s): "LIPASE", "AMYLASE" in the last 168 hours. No results for input(s): "AMMONIA" in the last 168 hours. Coagulation Profile: Recent Labs  Lab 05/22/22 1643 05/23/22 0541  INR 1.5* 2.3*   Cardiac Enzymes: No results for input(s): "CKTOTAL", "CKMB", "CKMBINDEX", "TROPONINI" in the last 168 hours. BNP (last 3 results) No results for input(s): "PROBNP" in the last 8760 hours. HbA1C: No results for input(s): "HGBA1C" in the last 72 hours. CBG: No results for input(s): "GLUCAP" in the last 168 hours. Lipid Profile: No results for input(s): "CHOL", "HDL", "LDLCALC", "TRIG", "CHOLHDL", "LDLDIRECT" in the last 72 hours. Thyroid Function Tests: No results  for input(s): "TSH", "T4TOTAL", "FREET4", "T3FREE", "THYROIDAB" in the last 72 hours. Anemia Panel: No results for input(s): "VITAMINB12", "FOLATE", "FERRITIN", "TIBC", "IRON", "RETICCTPCT" in the last 72 hours. Sepsis Labs: Recent Labs  Lab 05/22/22 1643 05/22/22 1909 05/23/22 0541  PROCALCITON  --   --  0.65  LATICACIDVEN 2.7* 1.2  --     Recent Results (from the past 240 hour(s))  Resp panel by RT-PCR (RSV, Flu A&B, Covid) Anterior Nasal Swab     Status: None   Collection Time: 05/22/22  4:43 PM   Specimen: Anterior Nasal Swab  Result Value Ref Range Status   SARS Coronavirus 2 by RT PCR NEGATIVE NEGATIVE Final    Comment: (NOTE) SARS-CoV-2 target nucleic acids are NOT DETECTED.  The SARS-CoV-2 RNA is generally detectable in upper respiratory specimens during the acute phase of infection. The lowest concentration of SARS-CoV-2 viral copies this assay can detect is 138 copies/mL. A negative result does not preclude SARS-Cov-2 infection and should not be used as the sole basis for treatment or other patient management decisions. A negative result may occur with  improper specimen collection/handling, submission of specimen other than nasopharyngeal swab, presence of viral mutation(s) within the areas targeted by this assay, and inadequate number of viral copies(<138 copies/mL). A negative result must be combined with clinical observations, patient history, and epidemiological information. The expected result is Negative.  Fact Sheet for Patients:  BloggerCourse.com  Fact Sheet for Healthcare Providers:  SeriousBroker.it  This test is no t yet approved or cleared by the [place] FDA and  has been authorized for detection and/or diagnosis of SARS-CoV-2 by FDA under an Emergency Use Authorization (EUA). This EUA will remain  in effect (meaning this test can be used) for the duration of the COVID-19 declaration under  Section 564(b)(1) of the Act, 21 U.S.C.section 360bbb-3(b)(1), unless the authorization is terminated  or revoked sooner.       Influenza A by PCR NEGATIVE NEGATIVE Final   Influenza B by PCR NEGATIVE NEGATIVE Final    Comment: (NOTE) The Xpert Xpress SARS-CoV-2/FLU/RSV plus assay is intended as an aid in the diagnosis of influenza from Nasopharyngeal swab specimens and should not be used as a sole basis for treatment. Nasal washings and aspirates are unacceptable for Xpert Xpress SARS-CoV-2/FLU/RSV testing.  Fact Sheet for Patients: BloggerCourse.com  Fact Sheet for Healthcare Providers: SeriousBroker.it  This test is not yet approved or cleared by the [place] FDA and has been authorized for detection and/or diagnosis of SARS-CoV-2 by FDA  under an Emergency Use Authorization (EUA). This EUA will remain in effect (meaning this test can be used) for the duration of the COVID-19 declaration under Section 564(b)(1) of the Act, 21 U.S.C. section 360bbb-3(b)(1), unless the authorization is terminated or revoked.     Resp Syncytial Virus by PCR NEGATIVE NEGATIVE Final    Comment: (NOTE) Fact Sheet for Patients: BloggerCourse.com  Fact Sheet for Healthcare Providers: SeriousBroker.it  This test is not yet approved or cleared by the [place] FDA and has been authorized for detection and/or diagnosis of SARS-CoV-2 by FDA under an Emergency Use Authorization (EUA). This EUA will remain in effect (meaning this test can be used) for the duration of the COVID-19 declaration under Section 564(b)(1) of the Act, 21 U.S.C. section 360bbb-3(b)(1), unless the authorization is terminated or revoked.  Performed at [facility], [address] [place], [place] [address]   Blood Culture (routine x 2)     Status: None (Preliminary result)   Collection Time: 05/22/22  4:43  PM   Specimen: BLOOD  Result Value Ref Range Status   Specimen Description BLOOD RIGHT ANTECUBITAL  Final   Special Requests   Final    BOTTLES DRAWN AEROBIC AND ANAEROBIC Blood Culture results may not be optimal due to an inadequate volume of blood received in culture bottles   Culture   Final    NO GROWTH 4 DAYS Performed at [facility], [address], [place], [place] [address]    Report Status PENDING  Incomplete  Blood Culture (routine x 2)     Status: None (Preliminary result)   Collection Time: 05/22/22  4:48 PM   Specimen: BLOOD  Result Value Ref Range Status   Specimen Description BLOOD BLOOD LEFT FOREARM  Final   Special Requests   Final    BOTTLES DRAWN AEROBIC AND ANAEROBIC Blood Culture adequate volume   Culture   Final    NO GROWTH 4 DAYS Performed at [facility], [address], [place], [place] [address]    Report Status PENDING  Incomplete    Radiology Studies: No results found.  Scheduled Meds:  azithromycin  500 mg Oral Q24H   flecainide  75 mg Oral BID   mesalamine  4.8 g Oral Q breakfast   metoprolol succinate  12.5 mg Oral Daily   mometasone-formoterol  2 puff Inhalation BID   revefenacin  175 mcg Nebulization Daily   rivaroxaban  20 mg Oral Q supper   rosuvastatin  10 mg Oral Daily   saccharomyces boulardii  250 mg Oral BID   tamsulosin  0.4 mg Oral Daily   Continuous Infusions:  cefTRIAXone (ROCEPHIN)  IV Stopped (05/25/22 1823)   potassium PHOSPHATE IVPB (in mmol) 30 mmol (05/26/22 1100)     LOS: 4 days    Time spent: 35 MINS  Willeen Niece, MD [facility]   If 7PM-7AM, please contact night-coverage

## 2022-05-27 DIAGNOSIS — I4891 Unspecified atrial fibrillation: Secondary | ICD-10-CM | POA: Diagnosis not present

## 2022-05-27 LAB — BASIC METABOLIC PANEL
Anion gap: 7 (ref 5–15)
BUN: 8 mg/dL (ref 8–23)
CO2: 30 mmol/L (ref 22–32)
Calcium: 8.3 mg/dL — ABNORMAL LOW (ref 8.9–10.3)
Chloride: 102 mmol/L (ref 98–111)
Creatinine, Ser: 0.72 mg/dL (ref 0.61–1.24)
GFR, Estimated: >60 mL/min (ref >60)
Glucose, Bld: 108 mg/dL — ABNORMAL HIGH (ref 70–99)
Potassium: 3.7 mmol/L (ref 3.5–5.1)
Sodium: 139 mmol/L (ref 135–145)

## 2022-05-27 LAB — CULTURE, BLOOD (ROUTINE X 2)
Culture: NO GROWTH
Special Requests: ADEQUATE

## 2022-05-27 LAB — CBC
HCT: 35.6 % — ABNORMAL LOW (ref 39.0–52.0)
Hemoglobin: 11.7 g/dL — ABNORMAL LOW (ref 13.0–17.0)
MCH: 30.5 pg (ref 26.0–34.0)
MCHC: 32.9 g/dL (ref 30.0–36.0)
MCV: 93 fL (ref 80.0–100.0)
Platelets: 223 10*3/uL (ref 150–400)
RBC: 3.83 MIL/uL — ABNORMAL LOW (ref 4.22–5.81)
RDW: 11.5 % (ref 11.5–15.5)
WBC: 10.1 10*3/uL (ref 4.0–10.5)
nRBC: 0 % (ref 0.0–0.2)

## 2022-05-27 LAB — PHOSPHORUS: Phosphorus: 3.1 mg/dL (ref 2.5–4.6)

## 2022-05-27 LAB — MAGNESIUM: Magnesium: 2.1 mg/dL (ref 1.7–2.4)

## 2022-05-27 MED ORDER — DILTIAZEM HCL ER COATED BEADS 120 MG PO CP24
120.0000 mg | ORAL_CAPSULE | Freq: Every day | ORAL | Status: DC
  Administered 2022-05-28: 120 mg via ORAL
  Filled 2022-05-27: qty 1

## 2022-05-27 NOTE — TOC Progression Note (Signed)
Transition of Care [facility]) - Progression Note    Patient Details  Name: Antonio [name] MRN: [id number] Date of Birth: Oct 07, 1945  Transition of [facility]) CM/SW Contact  Truddie Hidden, RN Phone Number: 05/27/2022, 2:50 PM  Clinical Narrative:    Case reviewed for DME and changes in disposition.         Expected Discharge Plan and Services                                               Social Determinants of Health (SDOH) Interventions SDOH Screenings   Food Insecurity: No Food Insecurity (05/22/2022)  Housing: Low Risk  (05/22/2022)  Transportation Needs: No Transportation Needs (05/22/2022)  Utilities: Not At Risk (05/22/2022)  Depression (PHQ2-9): Low Risk  (12/30/2021)  Financial Resource Strain: Low Risk  (04/16/2017)  Physical Activity: Inactive (04/16/2017)  Social Connections: Unknown (04/16/2017)  Stress: No Stress Concern Present (04/16/2017)  Tobacco Use: Medium Risk (05/24/2022)    Readmission Risk Interventions     No data to display

## 2022-05-27 NOTE — Progress Notes (Signed)
Inpatient Rehabilitation Admissions Coordinator   I spoke with patient's wife by phone to review estimated cost of care if [employer] approved Cir. She would like to discuss with TOC back up options in case CIR is not approved. I await insurance determination for a possible CIR admit.  Ottie Glazier, RN, MSN Rehab Admissions Coordinator [phone] 05/27/2022 10:40 AM

## 2022-05-27 NOTE — Progress Notes (Addendum)
Rounding Note    Patient Name: Antonio [name] Date of Encounter: 05/27/2022  [facility] Cardiologist: Yvonne Kendall, MD   Subjective   Denies any chest pain and breathing has improved. Currently on 3L O2 via [place]. Still has some diarrhea.   Inpatient Medications    Scheduled Meds:  flecainide  75 mg Oral BID   mesalamine  4.8 g Oral Q breakfast   metoprolol succinate  12.5 mg Oral Daily   mometasone-formoterol  2 puff Inhalation BID   revefenacin  175 mcg Nebulization Daily   rivaroxaban  20 mg Oral Q supper   rosuvastatin  10 mg Oral Daily   saccharomyces boulardii  250 mg Oral BID   tamsulosin  0.4 mg Oral Daily   Continuous Infusions:   PRN Meds: acetaminophen **OR** acetaminophen, albuterol, alum & mag hydroxide-simeth, guaiFENesin, HYDROcodone-acetaminophen, ondansetron **OR** ondansetron (ZOFRAN) IV, polyvinyl alcohol, sodium chloride   Vital Signs    Vitals:   05/26/22 2324 05/27/22 0419 05/27/22 0729 05/27/22 0823  BP: (!) 112/54 (!) 124/56 131/67   Pulse: 68 63 65   Resp: Temp: 99.2 F (37.3 C) 98.4 F (36.9 C) 98 F (36.7 C)   TempSrc: Oral Oral Oral   SpO2: 97% 97% 97% 97%  Weight:      Height:        Intake/Output Summary (Last 24 hours) at 05/27/2022 0849 Last data filed at 05/26/2022 1500 Gross per 24 hour  Intake 661 ml  Output 200 ml  Net 461 ml       05/25/2022    4:00 AM 05/22/2022    9:33 PM 05/22/2022    4:22 PM  Last 3 Weights  Weight (lbs) 151 lb 146 lb 9.7 oz 147 lb  Weight (kg) 68.493 kg 66.5 kg 66.679 kg      Telemetry    SR with PVCs in 60-70s - Personally Reviewed  ECG    No new tracings- Personally Reviewed  Physical Exam   GEN: No acute distress.   Neck: No JVD Cardiac: RRR, no murmurs, rubs, or gallops.  Respiratory: Clear upper lobes with significantly diminished bases to auscultation bilaterally.  Rations are unlabored on 3 L of O2 via nasal cannula at rest GI: Soft, nontender,  non-distended  MS: No edema; No deformity. Neuro:  Nonfocal  Psych: Normal affect   Labs    High Sensitivity Troponin:   Recent Labs  Lab 05/22/22 1630 05/22/22 1909 05/24/22 0724  TROPONINIHS 15 34* 16      Chemistry Recent Labs  Lab 05/23/22 0541 05/26/22 0331 05/27/22 0412  NA 134* 137 139  K 3.6 3.1* 3.7  CL 106 101 102  CO2 GLUCOSE 104* 111* 108*  BUN 11 6* 8  CREATININE 0.72 0.72 0.72  CALCIUM 7.8* 8.3* 8.3*  MG  --  2.0 2.1  PROT  --  6.2*  --   ALBUMIN  --  2.7*  --   AST  --  24  --   ALT  --  15  --   ALKPHOS  --  91  --   BILITOT  --  0.7  --   GFRNONAA >60 >60 >60  ANIONGAP Lipids No results for input(s): "CHOL", "TRIG", "HDL", "LABVLDL", "LDLCALC", "CHOLHDL" in the last 168 hours.  Hematology Recent Labs  Lab 05/23/22 0541 05/26/22 0331 05/27/22 0412  WBC 8.5 8.6 10.1  RBC 3.71*  3.74* 3.83*  HGB 11.5* 11.4* 11.7*  HCT 34.1* 35.1* 35.6*  MCV 91.9 93.9 93.0  MCH 31.0 30.5 30.5  MCHC 33.7 32.5 32.9  RDW 11.7 11.5 11.5  PLT 155 195 223    Thyroid No results for input(s): "TSH", "FREET4" in the last 168 hours.  BNPNo results for input(s): "BNP", "PROBNP" in the last 168 hours.  DDimer No results for input(s): "DDIMER" in the last 168 hours.   Radiology    No results found.  Cardiac Studies  Coronary CTA 03/06/22 IMPRESSION: 1. Coronary calcium score of 251. This was 42nd percentile for age and sex matched control.   2. Normal coronary origin with left dominance.   3. Mild distal LM and proximal LAD stenosis (25-49%)   4. CAD-RADS 2. Mild non-obstructive CAD (25-49%). Consider non-atherosclerotic causes of chest pain. Consider preventive therapy and risk factor modification.    Patient Profile     77 y.o. male with past medical history of PAF/flutter on flecainide and rivaroxaban, nonobstructive coronary artery disease, severe COPD, PSVT, PAD status post left SFA stenting, hypertension, hyperlipidemia,  ulcerative colitis, BPH, who was admitted on 411 with acute on chronic respiratory failure, pneumonia, and atrial fibrillation RVR that has since converted to sinus.  Assessment & Plan    Paroxysmal atrial fibrillation atrial flutter -Longstanding history of paroxysmal atrial fibrillation we have been controlled in the outpatient setting with flecainide and low-dose metoprolol -After 7-day history of progressive malaise, dyspnea and fever with pneumonia and sepsis he was in atrial fibrillation with RVR on arrival to the emergency department -Converted to sinus rhythm on IV diltiazem which was discontinued on 4/13 -Continued on flecainide and beta-blocker therapy -Continued on rivaroxaban for stroke prophylaxis -Continue with cardiac monitoring -Continue outpatient follow-up with Dr. Lalla Brothers from EP  Demand ischemia/nonobstructive coronary artery disease -Mild troponin elevation with a peak of 34 -In the setting of rapid atrial fibrillation, sepsis, pneumonia -Recent coronary CT angiogram in January 2024 showed mild, nonobstructive CAD -Continued on low-dose beta-blocker and statin therapy -Rivaroxaban in lieu of aspirin therapy  Hypokalemia -Serum potassium 3.1>3.7 this AM -Hold off on Potassium supplementation and continue to monitor  -Patient continues to have diarrhea which is likely contributing to hypokalemia -Recommended to keep potassium>4 and <5 -Daily BMP -Monitor/trend/replete electrolytes as needed  Sepsis/right basilar pneumonia -Breathing improving -Antibiotics per IM -Titrate FiO2 as tolerated to keep oxygen saturations greater than equal to 92% -Incentive spirometer  Severe COPD -Diminished breath sounds on exam -Follows with pulmonary Dr. Jayme Cloud as an outpatient -Currently on 3 L of O2 via nasal cannula -Supportive care -Management per IM  Essential hypertension -Blood pressure 131/67 -Blood pressure remained stable -Vital signs per unit  protocol  Hyperlipidemia -LDL 50 18 July 2022 -Continue on statin therapy     For questions or updates, please contact [facility] Please consult [email or web address] for contact info under [facility].       SignedNathanial Millman, Student-PA  05/27/2022, 8:49 AM    I have independently seen and examined the patient and agree with the findings and plan, as documented in the PA/NP's note, with the following additions/changes.  Patient continues to have some dyspnea though it is improving.  No chest pain, palpitations, or lightheadedness.  He had some anxiety this morning, which resolved after he received a Vicodin physical exam is notable for regular rate and rhythm without murmurs.  Breath sounds are mildly diminished throughout without wheezes or crackles.  Abdomen is soft and nontender.  No lower extremity edema or JVD.  Telemetry shows sinus rhythm with PVCs.  From a heart standpoint, Mr. Hagood is improved without recurrent atrial fibrillation following spontaneous conversion to sinus rhythm on diltiazem.  Given his severe COPD, we have discussed switching him off metoprolol and we will transition him from metoprolol succinate to diltiazem 120 mg daily beginning tomorrow.  Continue current regimen of flecainide and rivaroxaban.  Ongoing management of COPD exacerbation and pneumonia per primary team.  No plans for ischemia evaluation; minimal troponin elevation consistent with supply-demand mismatch.  Replete electrolytes to maintain potassium and magnesium levels greater than 4.0 and 2.0, respectively.  Yvonne Kendall, MD [facility] 05/27/22 4:28 PM

## 2022-05-27 NOTE — Progress Notes (Signed)
Physical Therapy Treatment Patient Details Name: Antonio [name] MRN: [id number] DOB: April 28, 1945 Today's Date: 05/27/2022   History of Present Illness Pt is a 77 y/o M admitted on 05/22/22 after presenting to the ED with c/o SOB & cough x 1 week. On arrival pt found to be in a-fib with RVR. Pt also being treated for sepsis 2/2 CAP. PMH: severe COPD with nocturnal hypoxemia using 2L supplemental O2, paroxysmal a-fib, SVT, PAD s/p L SFA stent, HTN, ulcerative colitis, BPH    PT Comments    Patient was agreeable to PT session. He is cooperative throughout. The patient required physical assistance with standing for anterior weight shifting as patient with posterior bias. He reports feeling better overall but is still fatigued with standing activity. Several brief standing rest breaks required with hallway ambulation using rolling walker. Sp02 91% on 3 L02 after walking with initial cues for breathing techniques. Although patient is improving, he is not at his baseline level of functional mobility. The spouse is still requesting inpatient rehab at discharge if possible. PT will continue to follow to maximize independence. Anticipate the need for intermittent supervision/assistance with continued PT recommended after this hospital discharge.    Recommendations for follow up therapy are one component of a multi-disciplinary discharge planning process, led by the attending physician.  Recommendations may be updated based on patient status, additional functional criteria and insurance authorization.  Follow Up Recommendations       Assistance Recommended at Discharge Intermittent Supervision/Assistance  Patient can return home with the following A little help with walking and/or transfers;A little help with bathing/dressing/bathroom;Assistance with cooking/housework;Assist for transportation;Help with stairs or ramp for entrance   Equipment Recommendations  Rolling walker (2 wheels);BSC/3in1     Recommendations for Other Services       Precautions / Restrictions Precautions Precautions: Fall Restrictions Weight Bearing Restrictions: No     Mobility  Bed Mobility Overal bed mobility: Modified Independent Bed Mobility: Supine to Sit, Sit to Supine     Supine to sit: HOB elevated, Min guard Sit to supine: Supervision, HOB elevated   General bed mobility comments: verbal cues for task initiation    Transfers Overall transfer level: Needs assistance Equipment used: Rolling walker (2 wheels) Transfers: Sit to/from Stand Sit to Stand: Min assist           General transfer comment: patient has posterior bias with standing that required Min A for anterior weight shifting during the transfer.    Ambulation/Gait Ambulation/Gait assistance: Min guard Gait Distance (Feet):  (4 bouts of 90 ft) Assistive device: Rolling walker (2 wheels) Gait Pattern/deviations: Step-through pattern Gait velocity: decreased     General Gait Details: brief standing breaks taken during walking bout. cues for breathing techniques with mild dyspnea with exertion. Sp02 91% on 3 L02 with heart rate in the 70's after walking   Stairs             Wheelchair Mobility    Modified Rankin (Stroke Patients Only)       Balance Overall balance assessment: Needs assistance Sitting-balance support: Feet supported Sitting balance-Leahy Scale: Fair   Postural control: Posterior lean Standing balance support: No upper extremity supported, During functional activity Standing balance-Leahy Scale:  (poor to fair) Standing balance comment: initial support required to achieve midline standing balance with anterior weight shifting support provided                            Cognition  Arousal/Alertness: Awake/alert Behavior During Therapy: WFL for tasks assessed/performed Overall Cognitive Status: Within Functional Limits for tasks assessed                                  General Comments: patient is cooperative and able to follow all directions without difficulty. the spouse reports patient has dementia that is worse at night        Exercises      General Comments General comments (skin integrity, edema, etc.): spouse is concerned about patient coming home from the hospital and would prefer acute inpatient rehab if possible for intense therapy following this hospital stay      Pertinent Vitals/Pain Pain Assessment Pain Assessment: No/denies pain    Home Living                          Prior Function            PT Goals (current goals can now be found in the care plan section) Acute Rehab PT Goals Patient Stated Goal: to return home PT Goal Formulation: With patient Time For Goal Achievement: 06/08/22 Potential to Achieve Goals: Good Progress towards PT goals: Progressing toward goals    Frequency    Min 4X/week      PT Plan Current plan remains appropriate    Co-evaluation              AM-PAC PT "6 Clicks" Mobility   Outcome Measure  Help needed turning from your back to your side while in a flat bed without using bedrails?: None Help needed moving from lying on your back to sitting on the side of a flat bed without using bedrails?: A Little Help needed moving to and from a bed to a chair (including a wheelchair)?: A Little Help needed standing up from a chair using your arms (e.g., wheelchair or bedside chair)?: A Little Help needed to walk in hospital room?: A Little Help needed climbing 3-5 steps with a railing? : A Lot 6 Click Score: 18    End of Session Equipment Utilized During Treatment: Oxygen Activity Tolerance: Patient limited by fatigue Patient left: in bed;with call bell/phone within reach;with bed alarm set;with family/visitor present Nurse Communication: Mobility status PT Visit Diagnosis: Unsteadiness on feet (R26.81);Difficulty in walking, not elsewhere classified (R26.2);Muscle  weakness (generalized) (M62.81);Other abnormalities of gait and mobility (R26.89)     Time: 1240-1313 PT Time Calculation (min) (ACUTE ONLY): 33 min  Charges:  $Therapeutic Activity: 23-37 mins                     Donna Bernard, PT, MPT    Ina Homes 05/27/2022, 2:23 PM

## 2022-05-27 NOTE — Progress Notes (Signed)
PROGRESS NOTE    Antonio [name]  ION:[id number] DOB: February 18, 1945 DOA: 05/22/2022 PCP: Duanne Limerick, MD   Brief Narrative:  This 77 yrs old Male with PMH significant for severe COPD with nocturnal hypoxemia using 2 L of supplemental oxygen at night, paroxysmal A-fib on Xarelto and flecainide, not a candidate for invasive procedures due to severe lung disease, SVT, PAD s/p left SFA stent, hypertension, ulcerative colitis, BPH with a history of acute urinary retention requiring Foley presented in the ED with complaints of shortness of breath and cough for 1 week.  Patient reports cough is progressively getting worse and has developed chest pressure and fatigue.  On arrival in the ED he was hypertensive, tachycardic, tachypneic and febrile. EKG shows A-fib with RVR with heart rate 185.  WBC 13 K, lactic acid 2.7, troponin 15> 34 RVP negative.Chest x-ray reveals patchy airspace disease concerning for pneumonia.  Patient was given a dose of flecainide and started on Cardizem infusion and heart rate improved. Patient is admitted for sepsis secondary to pneumonia and started on empiric antibiotics ( Vancomycin,  Flagyl and cefepime ).  Assessment & Plan:   Principal Problem:   Atrial fibrillation with rapid ventricular response Active Problems:   CAP (community acquired pneumonia)   Sepsis due to pneumonia   Stage 3 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   Nocturnal hypoxemia   Ulcerative colitis   PAD ss/p SFA stent(peripheral artery disease)   BPH (benign prostatic hyperplasia)   History of urinary retention   Demand ischemia   Pure hypercholesterolemia   Primary hypertension  Atrial fibrillation with RVR: Likely secondary to pneumonia with sepsis. Continue flecainide 75 mg every 12 hours. Continue Xarelto 20 mg po daily. Continue metoprolol 12.5 mg daily. Cardiology is consulted.  Not to escalate the dose of metoprolol due to bradycardia at bedside. HR controlled, Continue flecainide,  metoprolol and Xarelto.  Sepsis : Community-acquired pneumonia: Patient presented with fever, tachycardia, tachypnea, lactic acid 2.7. Chest x-ray showed patchy airspace disease concerning for pneumonia. Continue IV fluid resuscitation. Lactic acid improved with IV hydration Continue ceftriaxone and Zithromax x 5 days. Continue antitussives,  add flutter valve and incentive spirometry Continue supplemental oxygen and wean as tolerated.  COPD stage III by Gold classification: Nocturnal hypoxemia: Patient regularly follows up with pulmonologist. Continue supplemental oxygen and wean as tolerated. Continue DuoNeb as needed. Dr. Jayme Cloud consult appreciated.  Patient is started on inhalers.   BPH: Continue Flomax 0.4 mg daily.   PAD s/p SFA stent(peripheral artery disease): Continue Xarelto and rosuvastatin.   Ulcerative colitis: No acute issues. Continue mesalamine.  Diarrhea: Patient reports loose watery stools.  Differential could include stress, antibiotic induced, ulcerative colitis flare.   Patient denies any bleeding or smell in the stool.   Patient denies fever, no leukocytosis,  no need to check C. difficile. Continue Imodium as needed.   DVT prophylaxis: Xarelto Code Status: Full code Family Communication:Wife at bed side. Disposition Plan:   Status is: Inpatient Remains inpatient appropriate because: Admitted for sepsis secondary to community-acquired pneumonia and A-fib with RVR.  Cardiology and pulmonology consulted.  PT and OT recommended acute inpatient rehab.  Awaiting insurance authorization.   Consultants:  Cardiology Pulmonology  Procedures: None  Antimicrobials:  Anti-infectives (From admission, onward)    Start     Dose/Rate Route Frequency Ordered Stop   05/24/22 2200  azithromycin (ZITHROMAX) tablet 500 mg        500 mg Oral Every 24 hours 05/24/22 0908 05/26/22 2102  05/23/22 1500  cefTRIAXone (ROCEPHIN) 2 g in sodium chloride 0.9 % 100 mL  IVPB        2 g 200 mL/hr over 30 Minutes Intravenous Every 24 hours 05/23/22 0959 05/26/22 1742   05/23/22 0100  ceFEPIme (MAXIPIME) 2 g in sodium chloride 0.9 % 100 mL IVPB  Status:  Discontinued        2 g 200 mL/hr over 30 Minutes Intravenous Every 8 hours 05/22/22 2127 05/23/22 0959   05/22/22 2300  azithromycin (ZITHROMAX) 500 mg in sodium chloride 0.9 % 250 mL IVPB  Status:  Discontinued        500 mg 250 mL/hr over 60 Minutes Intravenous Every 24 hours 05/22/22 2042 05/24/22 0908   05/22/22 1645  ceFEPIme (MAXIPIME) 2 g in sodium chloride 0.9 % 100 mL IVPB        2 g 200 mL/hr over 30 Minutes Intravenous  Once 05/22/22 1643 05/22/22 1736   05/22/22 1645  metroNIDAZOLE (FLAGYL) IVPB 500 mg        500 mg 100 mL/hr over 60 Minutes Intravenous  Once 05/22/22 1643 05/22/22 2031   05/22/22 1645  vancomycin (VANCOCIN) IVPB 1000 mg/200 mL premix        1,000 mg 200 mL/hr over 60 Minutes Intravenous  Once 05/22/22 1643 05/22/22 1856      Subjective: Patient was seen and examined at bedside. Overnight events noted.   Patient reports doing better, feels weakness after he has loose stools. He denies any blood in the stools or foul smell. He remains on 3 L of supplemental oxygen.  Objective: Vitals:   05/26/22 2324 05/27/22 0419 05/27/22 0729 05/27/22 0823  BP: (!) 112/54 (!) 124/56 131/67   Pulse: 68 63 65   Resp: 16 17 13    Temp: 99.2 F (37.3 C) 98.4 F (36.9 C) 98 F (36.7 C)   TempSrc: Oral Oral Oral   SpO2: 97% 97% 97% 97%  Weight:      Height:        Intake/Output Summary (Last 24 hours) at 05/27/2022 1336 Last data filed at 05/27/2022 1048 Gross per 24 hour  Intake 661 ml  Output 350 ml  Net 311 ml   Filed Weights   05/22/22 1622 05/22/22 2133 05/25/22 0400  Weight: 66.7 kg 66.5 kg 68.5 kg    Examination:  General exam: Appears comfortable, deconditioned, not in any distress. Respiratory system: CTA bilaterally, respiratory effort normal, RR  13 Cardiovascular system: S1 & S2 heard, irregular rhythm, no murmur. Gastrointestinal system: Abdomen is soft, non distended, non tender, BS+ Central nervous system: Alert and oriented x 3, No focal neurological deficits. Extremities: No edema, no cyanosis, no clubbing Skin: No rashes, lesions or ulcers Psychiatry: Judgement and insight appear normal. Mood & affect appropriate.     Data Reviewed: I have personally reviewed following labs and imaging studies  CBC: Recent Labs  Lab 05/22/22 1630 05/23/22 0541 05/26/22 0331 05/27/22 0412  WBC 13.2* 8.5 8.6 10.1  HGB 15.1 11.5* 11.4* 11.7*  HCT 44.5 34.1* 35.1* 35.6*  MCV 90.6 91.9 93.9 93.0  PLT 188 155 195 223   Basic Metabolic Panel: Recent Labs  Lab 05/22/22 1630 05/23/22 0541 05/26/22 0331 05/27/22 0412  NA 129* 134* 137 139  K 3.7 3.6 3.1* 3.7  CL 97* 106 101 102  CO2 19* 23 29 30   GLUCOSE 116* 104* 111* 108*  BUN 13 11 6* 8  CREATININE 0.88 0.72 0.72 0.72  CALCIUM 8.8* 7.8* 8.3*  8.3*  MG  --   --  2.0 2.1  PHOS  --   --  2.4* 3.1   GFR: Estimated Creatinine Clearance: 72.5 mL/min (by C-G formula based on SCr of 0.72 mg/dL). Liver Function Tests: Recent Labs  Lab 05/26/22 0331  AST 24  ALT 15  ALKPHOS 91  BILITOT 0.7  PROT 6.2*  ALBUMIN 2.7*   No results for input(s): "LIPASE", "AMYLASE" in the last 168 hours. No results for input(s): "AMMONIA" in the last 168 hours. Coagulation Profile: Recent Labs  Lab 05/22/22 1643 05/23/22 0541  INR 1.5* 2.3*   Cardiac Enzymes: No results for input(s): "CKTOTAL", "CKMB", "CKMBINDEX", "TROPONINI" in the last 168 hours. BNP (last 3 results) No results for input(s): "PROBNP" in the last 8760 hours. HbA1C: No results for input(s): "HGBA1C" in the last 72 hours. CBG: No results for input(s): "GLUCAP" in the last 168 hours. Lipid Profile: No results for input(s): "CHOL", "HDL", "LDLCALC", "TRIG", "CHOLHDL", "LDLDIRECT" in the last 72 hours. Thyroid Function  Tests: No results for input(s): "TSH", "T4TOTAL", "FREET4", "T3FREE", "THYROIDAB" in the last 72 hours. Anemia Panel: No results for input(s): "VITAMINB12", "FOLATE", "FERRITIN", "TIBC", "IRON", "RETICCTPCT" in the last 72 hours. Sepsis Labs: Recent Labs  Lab 05/22/22 1643 05/22/22 1909 05/23/22 0541  PROCALCITON  --   --  0.65  LATICACIDVEN 2.7* 1.2  --     Recent Results (from the past 240 hour(s))  Resp panel by RT-PCR (RSV, Flu A&B, Covid) Anterior Nasal Swab     Status: None   Collection Time: 05/22/22  4:43 PM   Specimen: Anterior Nasal Swab  Result Value Ref Range Status   SARS Coronavirus 2 by RT PCR NEGATIVE NEGATIVE Final    Comment: (NOTE) SARS-CoV-2 target nucleic acids are NOT DETECTED.  The SARS-CoV-2 RNA is generally detectable in upper respiratory specimens during the acute phase of infection. The lowest concentration of SARS-CoV-2 viral copies this assay can detect is 138 copies/mL. A negative result does not preclude SARS-Cov-2 infection and should not be used as the sole basis for treatment or other patient management decisions. A negative result may occur with  improper specimen collection/handling, submission of specimen other than nasopharyngeal swab, presence of viral mutation(s) within the areas targeted by this assay, and inadequate number of viral copies(<138 copies/mL). A negative result must be combined with clinical observations, patient history, and epidemiological information. The expected result is Negative.  Fact Sheet for Patients:  BloggerCourse.com  Fact Sheet for Healthcare Providers:  SeriousBroker.it  This test is no t yet approved or cleared by the [place] FDA and  has been authorized for detection and/or diagnosis of SARS-CoV-2 by FDA under an Emergency Use Authorization (EUA). This EUA will remain  in effect (meaning this test can be used) for the duration of the COVID-19  declaration under Section 564(b)(1) of the Act, 21 U.S.C.section 360bbb-3(b)(1), unless the authorization is terminated  or revoked sooner.       Influenza A by PCR NEGATIVE NEGATIVE Final   Influenza B by PCR NEGATIVE NEGATIVE Final    Comment: (NOTE) The Xpert Xpress SARS-CoV-2/FLU/RSV plus assay is intended as an aid in the diagnosis of influenza from Nasopharyngeal swab specimens and should not be used as a sole basis for treatment. Nasal washings and aspirates are unacceptable for Xpert Xpress SARS-CoV-2/FLU/RSV testing.  Fact Sheet for Patients: BloggerCourse.com  Fact Sheet for Healthcare Providers: SeriousBroker.it  This test is not yet approved or cleared by the [place] and  has been authorized for detection and/or diagnosis of SARS-CoV-2 by FDA under an Emergency Use Authorization (EUA). This EUA will remain in effect (meaning this test can be used) for the duration of the COVID-19 declaration under Section 564(b)(1) of the Act, 21 U.S.C. section 360bbb-3(b)(1), unless the authorization is terminated or revoked.     Resp Syncytial Virus by PCR NEGATIVE NEGATIVE Final    Comment: (NOTE) Fact Sheet for Patients: BloggerCourse.com  Fact Sheet for Healthcare Providers: SeriousBroker.it  This test is not yet approved or cleared by the [place] FDA and has been authorized for detection and/or diagnosis of SARS-CoV-2 by FDA under an Emergency Use Authorization (EUA). This EUA will remain in effect (meaning this test can be used) for the duration of the COVID-19 declaration under Section 564(b)(1) of the Act, 21 U.S.C. section 360bbb-3(b)(1), unless the authorization is terminated or revoked.  Performed at [facility], [address] [place], [place] [address]   Blood Culture (routine x 2)     Status: None   Collection Time: 05/22/22  4:43 PM    Specimen: BLOOD  Result Value Ref Range Status   Specimen Description BLOOD RIGHT ANTECUBITAL  Final   Special Requests   Final    BOTTLES DRAWN AEROBIC AND ANAEROBIC Blood Culture results may not be optimal due to an inadequate volume of blood received in culture bottles   Culture   Final    NO GROWTH 5 DAYS Performed at [facility], [address] [place], [place] [address]    Report Status 05/27/2022 FINAL  Final  Blood Culture (routine x 2)     Status: None   Collection Time: 05/22/22  4:48 PM   Specimen: BLOOD  Result Value Ref Range Status   Specimen Description BLOOD BLOOD LEFT FOREARM  Final   Special Requests   Final    BOTTLES DRAWN AEROBIC AND ANAEROBIC Blood Culture adequate volume   Culture   Final    NO GROWTH 5 DAYS Performed at [facility], [address], [place], [place] [address]    Report Status 05/27/2022 FINAL  Final    Radiology Studies: No results found.  Scheduled Meds:  flecainide  75 mg Oral BID   mesalamine  4.8 g Oral Q breakfast   metoprolol succinate  12.5 mg Oral Daily   mometasone-formoterol  2 puff Inhalation BID   revefenacin  175 mcg Nebulization Daily   rivaroxaban  20 mg Oral Q supper   rosuvastatin  10 mg Oral Daily   saccharomyces boulardii  250 mg Oral BID   tamsulosin  0.4 mg Oral Daily   Continuous Infusions:   LOS: 5 days    Time spent: 35 MINS  Willeen Niece, MD [facility]   If 7PM-7AM, please contact night-coverage

## 2022-05-27 NOTE — Progress Notes (Addendum)
Occupational Therapy Treatment Patient Details Name: Antonio [name] MRN: [id number] DOB: [birth year] Today's Date: 05/27/2022   History of present illness Pt is a 77 y/o M admitted on 05/22/22 after presenting to the ED with c/o SOB & cough x 1 week. On arrival pt found to be in a-fib with RVR. Pt also being treated for sepsis 2/2 CAP. PMH: severe COPD with nocturnal hypoxemia using 2L supplemental O2, paroxysmal a-fib, SVT, PAD s/p L SFA stent, HTN, ulcerative colitis, BPH   OT comments  Antonio [name] was seen for OT treatment on this date. Upon arrival to room pt recline in bed, agreeable to tx. Pt requires CGA + RW toilet and shower t/f, 1 lateral LOB requiring MIN A to correct. MOD I pericare in sitting, SUP for hand washing standing. Educated on ECS and DME recs. Pt making good progress toward goals, will continue to follow POC. Discharge recommendation updated to reflect progress.     Recommendations for follow up therapy are one component of a multi-disciplinary discharge planning process, led by the attending physician.  Recommendations may be updated based on patient status, additional functional criteria and insurance authorization.    Assistance Recommended at Discharge Intermittent Supervision/Assistance  Patient can return home with the following  A little help with walking and/or transfers;A little help with bathing/dressing/bathroom;Assistance with cooking/housework;Assist for transportation;Help with stairs or ramp for entrance   Equipment Recommendations  BSC/3in1    Recommendations for Other Services      Precautions / Restrictions Precautions Precautions: Fall Restrictions Weight Bearing Restrictions: No       Mobility Bed Mobility Overal bed mobility: Modified Independent                  Transfers Overall transfer level: Needs assistance Equipment used: Rolling walker (2 wheels) Transfers: Sit to/from Stand Sit to Stand: Min guard                  Balance Overall balance assessment: Needs assistance Sitting-balance support: Feet supported Sitting balance-Leahy Scale: Fair     Standing balance support: No upper extremity supported, During functional activity Standing balance-Leahy Scale: Fair (poor to fair) Standing balance comment: 1 minor LOB during turning                           ADL either performed or assessed with clinical judgement   ADL Overall ADL's : Needs assistance/impaired                                       General ADL Comments: CGA + RW toilet and shower t/f, 1 lateral LOB requiring MIN A to correct. MOD I pericare in sitting.      Cognition Arousal/Alertness: Awake/alert Behavior During Therapy: WFL for tasks assessed/performed Overall Cognitive Status: Within Functional Limits for tasks assessed                                 General Comments: patient is cooperative and able to follow all directions without difficulty. the spouse reports patient has dementia that is worse at night              General Comments SpO2 90% on 3L [place] during activity    Pertinent Vitals/ Pain       Pain Assessment Pain Assessment: No/denies  pain         Frequency  Min 3X/week        Progress Toward Goals  OT Goals(current goals can now be found in the care plan section)  Progress towards OT goals: Progressing toward goals  Acute Rehab OT Goals Patient Stated Goal: to go home OT Goal Formulation: With patient/family Time For Goal Achievement: 06/08/22 Potential to Achieve Goals: Good ADL Goals Pt Will Perform Grooming: with modified independence;standing Pt Will Perform Lower Body Dressing: with modified independence;sit to/from stand Pt Will Transfer to Toilet: with modified independence;ambulating Pt Will Perform Toileting - Clothing Manipulation and hygiene: with modified independence;sit to/from stand Additional ADL Goal #1: Pt will identify 2-3  Energy conservation techniques with Min VC for improved activity tolerance in ADL/IADL tasks  Plan Discharge plan needs to be updated;Frequency remains appropriate    Co-evaluation                 AM-PAC OT "6 Clicks" Daily Activity     Outcome Measure   Help from another person eating meals?: None Help from another person taking care of personal grooming?: A Little Help from another person toileting, which includes using toliet, bedpan, or urinal?: A Little Help from another person bathing (including washing, rinsing, drying)?: A Little Help from another person to put on and taking off regular upper body clothing?: None Help from another person to put on and taking off regular lower body clothing?: A Little 6 Click Score: 20    End of Session    OT Visit Diagnosis: Unsteadiness on feet (R26.81);Muscle weakness (generalized) (M62.81);Pain   Activity Tolerance Patient tolerated treatment well   Patient Left in bed;with call bell/phone within reach;with family/visitor present   Nurse Communication          Time: 1415-1440 OT Time Calculation (min): 25 min  Charges: OT General Charges $OT Visit: 1 Visit OT Treatments $Self Care/Home Management : 23-37 mins  Kathie Dike, M.S. OTR/L  05/27/22, 4:20 PM  ascom [phone]

## 2022-05-27 NOTE — Plan of Care (Signed)

## 2022-05-28 DIAGNOSIS — I4891 Unspecified atrial fibrillation: Secondary | ICD-10-CM | POA: Diagnosis not present

## 2022-05-28 LAB — BASIC METABOLIC PANEL
Anion gap: 9 (ref 5–15)
BUN: 9 mg/dL (ref 8–23)
CO2: 27 mmol/L (ref 22–32)
Calcium: 8.7 mg/dL — ABNORMAL LOW (ref 8.9–10.3)
Chloride: 102 mmol/L (ref 98–111)
Creatinine, Ser: 0.73 mg/dL (ref 0.61–1.24)
GFR, Estimated: >60 mL/min (ref >60)
Glucose, Bld: 107 mg/dL — ABNORMAL HIGH (ref 70–99)
Potassium: 4 mmol/L (ref 3.5–5.1)
Sodium: 138 mmol/L (ref 135–145)

## 2022-05-28 LAB — MAGNESIUM: Magnesium: 2.2 mg/dL (ref 1.7–2.4)

## 2022-05-28 LAB — PHOSPHORUS: Phosphorus: 2.7 mg/dL (ref 2.5–4.6)

## 2022-05-28 MED ORDER — DILTIAZEM HCL ER COATED BEADS 120 MG PO CP24: 120.0000 mg | ORAL_CAPSULE | Freq: Every day | ORAL | 1 refills | Status: DC

## 2022-05-28 MED ORDER — GUAIFENESIN ER 600 MG PO TB12: 600.0000 mg | ORAL_TABLET | Freq: Two times a day (BID) | ORAL | 0 refills | Status: DC | PRN

## 2022-05-28 NOTE — Discharge Summary (Signed)
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   Nocturnal hypoxemia   Ulcerative colitis   PAD ss/p SFA stent(peripheral artery disease)   BPH (benign prostatic hyperplasia)   History of urinary retention   Demand ischemia   Pure hypercholesterolemia   Primary hypertension   77 yrs old Male with PMH significant for severe COPD with nocturnal hypoxemia using 2 L of supplemental oxygen at night, paroxysmal A-fib on Xarelto and flecainide, not a candidate for invasive procedures due to severe lung disease, SVT, PAD s/p left SFA stent, hypertension, ulcerative colitis, BPH with a history of acute urinary retention requiring Foley presented in the ED with complaints of shortness of breath and cough for 1 week.   Chest x-ray reveals patchy airspace disease concerning for pneumonia.   Atrial fibrillation with RVR: Likely secondary to pneumonia with sepsis. Continue flecainide 75 mg every 12 hours. Continue Xarelto 20 mg po daily. Continue diltiazem qd per [facility] cards. D/c BB   Sepsis : Community-acquired pneumonia: Patient presented with fever, tachycardia, tachypnea, lactic acid 2.7. Chest x-ray showed patchy airspace disease concerning for pneumonia. Lactic acid improved with IV hydration Completed ceftriaxone and Zithromax x 5 days. On antitussives,  add flutter valve and incentive spirometry Continue supplemental oxygen and wean as tolerated--pt on chronic home oxygen --sepsis resolved    COPD stage III by Gold classification: Nocturnal hypoxemia: Patient regularly follows up with pulmonologist. Continue DuoNeb as needed. Dr. Jayme Cloud consult appreciated.  Patient is started on inhalers.f/u dr Marcos Eke next week   BPH: Continue Flomax 0.4 mg daily.   PAD s/p SFA stent(peripheral artery disease): Continue Xarelto and rosuvastatin.   Ulcerative colitis: No acute issues. Continue mesalamine.   Diarrhea: Patient reports loose watery stools.  Prn imodium. Slowed down  Pt did very well with PT/OT--ambulated around nurses station and climbed stairs as well. Ok to d/c home with HHPT . Pt and his wife agreeable   DVT prophylaxis: Xarelto Code Status: Full code Family Communication:Wife at bed side. Disposition Plan: Home with HHPT      Diet recommendation:  Discharge Diet Orders (From admission, onward)     Start     Ordered   05/28/22 0000  Diet - low sodium heart healthy        05/28/22 1325            DISCHARGE MEDICATION: Allergies as of 05/28/2022       Reactions   Penicillins Rash   Has patient had a PCN reaction causing immediate rash, facial/tongue/throat swelling, SOB or lightheadedness with hypotension:Yes Has patient had a PCN reaction causing severe rash involving mucus membranes or skin necrosis:Yes Has patient had a PCN reaction that required hospitalization:No Has patient had a PCN reaction occurring within the last 10 years:No If all of the above answers are "NO", then may proceed with Cephalosporin use.        Medication List     STOP taking these medications    hydrocortisone 2.5 %  cream   metoprolol succinate 25 MG 24 hr tablet Commonly known as: TOPROL-XL   ondansetron 4 MG disintegrating tablet Commonly known as: ZOFRAN-ODT       TAKE these medications    albuterol (2.5 MG/3ML) 0.083% nebulizer solution Commonly known as: PROVENTIL Take 3 mLs (2.5 mg total) by nebulization every 6 (six) hours as needed for  wheezing or shortness of breath.   albuterol 108 (90 Base) MCG/ACT inhaler Commonly known as: VENTOLIN HFA Inhale 2 puffs into the lungs every 6 (six) hours as needed for wheezing or shortness of breath.   Azelastine HCl 137 MCG/SPRAY Soln PLACE 1 SPRAY INTO BOTH NOSTRILS TWICE DAILY What changed: See the new instructions.   diltiazem 120 MG 24 hr capsule Commonly known as: CARDIZEM CD Take 1 capsule (120 mg total) by mouth daily. Start taking on: May 29, 2022   Dupixent 300 MG/2ML prefilled syringe Generic drug: dupilumab Inject 300 mg into the skin every 14 (fourteen) days.   famotidine 20 MG tablet Commonly known as: PEPCID Take 1 tablet (20 mg total) by mouth 2 (two) times daily as needed for heartburn or indigestion.   flecainide 50 MG tablet Commonly known as: TAMBOCOR Take 1.5 tablets (75 mg total) by mouth 2 (two) times daily.   furosemide 20 MG tablet Commonly known as: LASIX TAKE 1 TABLET BY MOUTH DAILY AS NEEDED (FOR SWELLING OR WEIGHT GAIN OF 3 LBS OVERNIGHT OR 5 LBS IN A WEEK). What changed: See the new instructions.   guaiFENesin 600 MG 12 hr tablet Commonly known as: MUCINEX Take 1 tablet (600 mg total) by mouth 2 (two) times daily as needed for to loosen phlegm.   ipratropium 0.02 % nebulizer solution Commonly known as: ATROVENT Take 2.5 mLs (0.5 mg total) by nebulization 4 (four) times daily.   mesalamine 1.2 g EC tablet Commonly known as: LIALDA TAKE 4 TABLETS BY MOUTH DAILY WITH BREAKFAST What changed:  how much to take how to take this when to take this   rivaroxaban 20 MG Tabs tablet Commonly known as: XARELTO Take 1 tablet (20 mg total) by mouth daily with supper.   rosuvastatin 10 MG tablet Commonly known as: CRESTOR Take 1 tablet (10 mg total) by mouth daily.   Symbicort 160-4.5 MCG/ACT inhaler Generic drug: budesonide-formoterol Inhale 2 puffs into the lungs 2 (two) times daily.   tamsulosin 0.4 MG Caps capsule Commonly known as:  FLOMAX Take 1 capsule (0.4 mg total) by mouth daily.               Durable Medical Equipment  (From admission, onward)           Start     Ordered   05/28/22 1325  DME Walker  Once       Question Answer Comment  Walker: With 5 Inch Wheels   Patient needs a walker to treat with the following condition COPD (chronic obstructive pulmonary disease) with emphysema      05/28/22 1325            Follow-up Information     Duanne Limerick, MD. Schedule an appointment as soon as possible for a visit in 1 week(s).   Specialty: Family Medicine Contact information: [address] [place] [address] [phone]         Yvonne Kendall, MD Follow up in 1 week(s).   Specialty: Cardiology Why: f/u afib Contact information: [address] [place] [address] [phone]  Salena Saner, MD. Schedule an appointment as soon as possible for a visit in 1 week(s).   Specialty: Pulmonary Disease Why: copd Contact information: [address] [place] [address] [phone]                 Filed Weights   05/22/22 1622 05/22/22 2133 05/25/22 0400  Weight: 66.7 kg 66.5 kg 68.5 kg     Condition at discharge: fair  The results of significant diagnostics from this hospitalization (including imaging, microbiology, ancillary and laboratory) are listed below for reference.   Imaging Studies: DG Chest Port 1 View  Result Date: 05/22/2022 CLINICAL DATA:  Shortness of breath. EXAM: PORTABLE CHEST 1 VIEW COMPARISON:  12/16/2021 FINDINGS: No evidence for pulmonary edema or pneumothorax. Extreme left costophrenic sulcus has not been included on the film. Streaky opacity in the lung bases suggest atelectasis with superimposed patchy airspace disease in the medial right base concerning for pneumonia. The cardiopericardial silhouette is within normal limits for size. Telemetry leads overlie the chest. Defibrillator pads  overlie the chest. IMPRESSION: Patchy airspace disease in the medial right base concerning for pneumonia. Electronically Signed   By: Kennith Center M.D.   On: 05/22/2022 16:58    Microbiology: Results for orders placed or performed during the hospital encounter of 05/22/22  Resp panel by RT-PCR (RSV, Flu A&B, Covid) Anterior Nasal Swab     Status: None   Collection Time: 05/22/22  4:43 PM   Specimen: Anterior Nasal Swab  Result Value Ref Range Status   SARS Coronavirus 2 by RT PCR NEGATIVE NEGATIVE Final    Comment: (NOTE) SARS-CoV-2 target nucleic acids are NOT DETECTED.  The SARS-CoV-2 RNA is generally detectable in upper respiratory specimens during the acute phase of infection. The lowest concentration of SARS-CoV-2 viral copies this assay can detect is 138 copies/mL. A negative result does not preclude SARS-Cov-2 infection and should not be used as the sole basis for treatment or other patient management decisions. A negative result may occur with  improper specimen collection/handling, submission of specimen other than nasopharyngeal swab, presence of viral mutation(s) within the areas targeted by this assay, and inadequate number of viral copies(<138 copies/mL). A negative result must be combined with clinical observations, patient history, and epidemiological information. The expected result is Negative.  Fact Sheet for Patients:  BloggerCourse.com  Fact Sheet for Healthcare Providers:  SeriousBroker.it  This test is no t yet approved or cleared by the [place] FDA and  has been authorized for detection and/or diagnosis of SARS-CoV-2 by FDA under an Emergency Use Authorization (EUA). This EUA will remain  in effect (meaning this test can be used) for the duration of the COVID-19 declaration under Section 564(b)(1) of the Act, 21 U.S.C.section 360bbb-3(b)(1), unless the authorization is terminated  or revoked sooner.        Influenza A by PCR NEGATIVE NEGATIVE Final   Influenza B by PCR NEGATIVE NEGATIVE Final    Comment: (NOTE) The Xpert Xpress SARS-CoV-2/FLU/RSV plus assay is intended as an aid in the diagnosis of influenza from Nasopharyngeal swab specimens and should not be used as a sole basis for treatment. Nasal washings and aspirates are unacceptable for Xpert Xpress SARS-CoV-2/FLU/RSV testing.  Fact Sheet for Patients: BloggerCourse.com  Fact Sheet for Healthcare Providers: SeriousBroker.it  This test is not yet approved or cleared by the [place] FDA and has been authorized for detection and/or diagnosis of SARS-CoV-2 by FDA under an Emergency Use Authorization (EUA). This  EUA will remain in effect (meaning this test can be used) for the duration of the COVID-19 declaration under Section 564(b)(1) of the Act, 21 U.S.C. section 360bbb-3(b)(1), unless the authorization is terminated or revoked.     Resp Syncytial Virus by PCR NEGATIVE NEGATIVE Final    Comment: (NOTE) Fact Sheet for Patients: BloggerCourse.com  Fact Sheet for Healthcare Providers: SeriousBroker.it  This test is not yet approved or cleared by the [place] FDA and has been authorized for detection and/or diagnosis of SARS-CoV-2 by FDA under an Emergency Use Authorization (EUA). This EUA will remain in effect (meaning this test can be used) for the duration of the COVID-19 declaration under Section 564(b)(1) of the Act, 21 U.S.C. section 360bbb-3(b)(1), unless the authorization is terminated or revoked.  Performed at [facility], [address] [place], [place] [address]   Blood Culture (routine x 2)     Status: None   Collection Time: 05/22/22  4:43 PM   Specimen: BLOOD  Result Value Ref Range Status   Specimen Description BLOOD RIGHT ANTECUBITAL  Final   Special Requests   Final     BOTTLES DRAWN AEROBIC AND ANAEROBIC Blood Culture results may not be optimal due to an inadequate volume of blood received in culture bottles   Culture   Final    NO GROWTH 5 DAYS Performed at [facility], [address] [place], [place] [address]    Report Status 05/27/2022 FINAL  Final  Blood Culture (routine x 2)     Status: None   Collection Time: 05/22/22  4:48 PM   Specimen: BLOOD  Result Value Ref Range Status   Specimen Description BLOOD BLOOD LEFT FOREARM  Final   Special Requests   Final    BOTTLES DRAWN AEROBIC AND ANAEROBIC Blood Culture adequate volume   Culture   Final    NO GROWTH 5 DAYS Performed at [facility], [address], [place], [place] [address]    Report Status 05/27/2022 FINAL  Final    Labs: CBC: Recent Labs  Lab 05/22/22 1630 05/23/22 0541 05/26/22 0331 05/27/22 0412  WBC 13.2* 8.5 8.6 10.1  HGB 15.1 11.5* 11.4* 11.7*  HCT 44.5 34.1* 35.1* 35.6*  MCV 90.6 91.9 93.9 93.0  PLT 188 155 195 223   Basic Metabolic Panel: Recent Labs  Lab 05/22/22 1630 05/23/22 0541 05/26/22 0331 05/27/22 0412 05/28/22 1117  NA 129* 134* 137 139 138  K 3.7 3.6 3.1* 3.7 4.0  CL 97* 106 101 102 102  CO2 19* 23 29 30 27   GLUCOSE 116* 104* 111* 108* 107*  BUN 13 11 6* 8 9  CREATININE 0.88 0.72 0.72 0.72 0.73  CALCIUM 8.8* 7.8* 8.3* 8.3* 8.7*  MG  --   --  2.0 2.1 2.2  PHOS  --   --  2.4* 3.1 2.7   Liver Function Tests: Recent Labs  Lab 05/26/22 0331  AST 24  ALT 15  ALKPHOS 91  BILITOT 0.7  PROT 6.2*  ALBUMIN 2.7*    Discharge time spent: greater than 30 minutes.  Signed: Enedina Finner, MD [facility] 05/28/2022

## 2022-05-28 NOTE — Progress Notes (Signed)
Inpatient Rehabilitation Admissions Coordinator   I have received a denial for CIR admit  I contacted patient's wife and she is aware. Noted plans for d/c home.  Ottie Glazier, RN, MSN Rehab Admissions Coordinator [phone] 05/28/2022 1:56 PM

## 2022-05-28 NOTE — Progress Notes (Signed)
Physical Therapy Treatment Patient Details Name: Townsend Cudworth MRN: [id number] DOB: 1945/04/19 Today's Date: 05/28/2022   History of Present Illness Pt is a 77 y/o M admitted on 05/22/22 after presenting to the ED with c/o SOB & cough x 1 week. On arrival pt found to be in a-fib with RVR. Pt also being treated for sepsis 2/2 CAP. PMH: severe COPD with nocturnal hypoxemia using 2L supplemental O2, paroxysmal a-fib, SVT, PAD s/p L SFA stent, HTN, ulcerative colitis, BPH    PT Comments    Patient received in bed, waiting for lunch, agrees to walk. Wife present in room. Long discussion about discharge plans. Patient wants to go home, wife in agreement. Patient is mod I with bed mobility and transfers. Ambulated 250 feet with RW and supervision. Ambulated up/down 7 steps with B rails. He is slightly fatigued but no need for standing rest breaks. O2 sats into mid 80%s with activity, returns to >90 within a minute or two. Patient educated on energy conservation techniques. He will continue to benefit from skilled PT to improve endurance and strength.       Recommendations for follow up therapy are one component of a multi-disciplinary discharge planning process, led by the attending physician.  Recommendations may be updated based on patient status, additional functional criteria and insurance authorization.  Follow Up Recommendations       Assistance Recommended at Discharge Intermittent Supervision/Assistance  Patient can return home with the following A little help with walking and/or transfers;A little help with bathing/dressing/bathroom;Assistance with cooking/housework;Assist for transportation;Help with stairs or ramp for entrance   Equipment Recommendations  Rolling walker (2 wheels)    Recommendations for Other Services       Precautions / Restrictions Precautions Precaution Comments: mod fall Restrictions Weight Bearing Restrictions: No     Mobility  Bed Mobility Overal  bed mobility: Modified Independent Bed Mobility: Supine to Sit, Sit to Supine     Supine to sit: Modified independent (Device/Increase time) Sit to supine: Modified independent (Device/Increase time)        Transfers Overall transfer level: Modified independent Equipment used: Rolling walker (2 wheels) Transfers: Sit to/from Stand Sit to Stand: Modified independent (Device/Increase time)                Ambulation/Gait Ambulation/Gait assistance: Min guard, Supervision Gait Distance (Feet): 250 Feet Assistive device: Rolling walker (2 wheels) Gait Pattern/deviations: Step-through pattern       General Gait Details: quick pace, cues needed to slow down and take his time to assist with SOB.  O2 sats in mid 80%s with activity, which Dr. Allena Katz reports is okay with his COPD.   Stairs Stairs: Yes Stairs assistance: Supervision Stair Management: Alternating pattern, Forwards, Two rails Number of Stairs: 7 General stair comments: safe on stairs, slightly winded   Wheelchair Mobility    Modified Rankin (Stroke Patients Only)       Balance Overall balance assessment: Modified Independent Sitting-balance support: Feet supported Sitting balance-Leahy Scale: Normal     Standing balance support: Bilateral upper extremity supported, During functional activity, Reliant on assistive device for balance Standing balance-Leahy Scale: Good Standing balance comment: can ambulate short distance, with single UE support                            Cognition Arousal/Alertness: Awake/alert Behavior During Therapy: WFL for tasks assessed/performed Overall Cognitive Status: Within Functional Limits for tasks assessed  Exercises      General Comments        Pertinent Vitals/Pain Pain Assessment Pain Assessment: No/denies pain    Home Living                          Prior Function             PT Goals (current goals can now be found in the care plan section) Acute Rehab PT Goals Patient Stated Goal: to return home PT Goal Formulation: With patient Time For Goal Achievement: 06/08/22 Potential to Achieve Goals: Good Progress towards PT goals: Progressing toward goals    Frequency           PT Plan Discharge plan needs to be updated;Frequency needs to be updated    Co-evaluation              AM-PAC PT "6 Clicks" Mobility   Outcome Measure  Help needed turning from your back to your side while in a flat bed without using bedrails?: None Help needed moving from lying on your back to sitting on the side of a flat bed without using bedrails?: None Help needed moving to and from a bed to a chair (including a wheelchair)?: A Little Help needed standing up from a chair using your arms (e.g., wheelchair or bedside chair)?: None Help needed to walk in hospital room?: A Little Help needed climbing 3-5 steps with a railing? : A Little 6 Click Score: 21    End of Session Equipment Utilized During Treatment: Gait belt Activity Tolerance: Patient tolerated treatment well Patient left: in bed;with call bell/phone within reach;with family/visitor present;Other (comment) (MD in room) Nurse Communication: Mobility status PT Visit Diagnosis: Muscle weakness (generalized) (M62.81)     Time: 4098-1191 PT Time Calculation (min) (ACUTE ONLY): 23 min  Charges:  $Gait Training: 23-37 mins                     Amena Dockham, PT, GCS 05/28/22,1:35 PM

## 2022-05-28 NOTE — TOC Progression Note (Addendum)
Transition of Care [facility]) - Progression Note    Patient Details  Name: Antonio [name] MRN: [id number] Date of Birth: 08/01/1945  Transition of [facility]) CM/SW Contact  Truddie Hidden, RN Phone Number: 05/28/2022, 3:02 PM  Clinical Narrative:    Spoke with patient and his spouse at the bedside. Mrs. Matera will transport the patient home.  Advised of therapy recommendation.  He is agreeable to services and would prefer [facility] Referral sent and accepted by Amy from [facility].  Rolling walker requested and enroute to room.  TOC signing off.          Expected Discharge Plan and Services         Expected Discharge Date: 05/28/22                                     Social Determinants of Health (SDOH) Interventions SDOH Screenings   Food Insecurity: No Food Insecurity (05/22/2022)  Housing: Low Risk  (05/22/2022)  Transportation Needs: No Transportation Needs (05/22/2022)  Utilities: Not At Risk (05/22/2022)  Depression (PHQ2-9): Low Risk  (12/30/2021)  Financial Resource Strain: Low Risk  (04/16/2017)  Physical Activity: Inactive (04/16/2017)  Social Connections: Unknown (04/16/2017)  Stress: No Stress Concern Present (04/16/2017)  Tobacco Use: Medium Risk (05/24/2022)    Readmission Risk Interventions     No data to display

## 2022-05-28 NOTE — Progress Notes (Signed)
Occupational Therapy Treatment Patient Details Name: Antonio [name] MRN: [id number] DOB: [birth year] Today's Date: 05/28/2022   History of present illness Pt is a 77 y/o M admitted on 05/22/22 after presenting to the ED with c/o SOB & cough x 1 week. On arrival pt found to be in a-fib with RVR. Pt also being treated for sepsis 2/2 CAP. PMH: severe COPD with nocturnal hypoxemia using 2L supplemental O2, paroxysmal a-fib, SVT, PAD s/p L SFA stent, HTN, ulcerative colitis, BPH.   OT comments  Upon entering the room, pt supine in bed with wife present in room and agreeable to OT intervention. Pt is placed on RA during session and he performs bed mobility without assistance. Pt stands and ambulates without use of AD and immediately reports urgency for toileting. Pt ambulates with min guard to bathroom for toileting needs. Pt able to perform clothing management and hygiene with min guard for balance and returns to sink to brush teeth in standing. Pt returning to sit on EOB and O2 saturation remains at or above 90% during session and pt left on RA with RN notified. Recommendation changed to reflect pt's progress.    Recommendations for follow up therapy are one component of a multi-disciplinary discharge planning process, led by the attending physician.  Recommendations may be updated based on patient status, additional functional criteria and insurance authorization.    Assistance Recommended at Discharge Intermittent Supervision/Assistance  Patient can return home with the following  A little help with walking and/or transfers;A little help with bathing/dressing/bathroom;Assistance with cooking/housework;Assist for transportation;Help with stairs or ramp for entrance   Equipment Recommendations  BSC/3in1       Precautions / Restrictions Precautions Precautions: Fall Precaution Comments: mod fall Restrictions Weight Bearing Restrictions: No       Mobility Bed Mobility Overal bed  mobility: Modified Independent                  Transfers Overall transfer level: Needs assistance Equipment used: 1 person hand held assist Transfers: Sit to/from Stand Sit to Stand: Supervision                 Balance Overall balance assessment: Modified Independent Sitting-balance support: Feet supported Sitting balance-Leahy Scale: Normal     Standing balance support: Bilateral upper extremity supported, During functional activity Standing balance-Leahy Scale: Good                             ADL either performed or assessed with clinical judgement   ADL Overall ADL's : Needs assistance/impaired     Grooming: Wash/dry hands;Oral care;Wash/dry face;Supervision/safety;Standing                   Toilet Transfer: Solicitor;Ambulation   Toileting- Clothing Manipulation and Hygiene: Min guard;Sit to/from stand              Extremity/Trunk Assessment Upper Extremity Assessment Upper Extremity Assessment: Generalized weakness   Lower Extremity Assessment Lower Extremity Assessment: Generalized weakness        Vision Patient Visual Report: No change from baseline            Cognition Arousal/Alertness: Awake/alert Behavior During Therapy: WFL for tasks assessed/performed Overall Cognitive Status: Within Functional Limits for tasks assessed  Pertinent Vitals/ Pain       Pain Assessment Pain Assessment: No/denies pain         Frequency  Min 1X/week        Progress Toward Goals  OT Goals(current goals can now be found in the care plan section)  Progress towards OT goals: Progressing toward goals     Plan Discharge plan remains appropriate;Frequency needs to be updated       AM-PAC OT "6 Clicks" Daily Activity     Outcome Measure   Help from another person eating meals?: None Help from another person taking care of personal  grooming?: None Help from another person toileting, which includes using toliet, bedpan, or urinal?: A Little Help from another person bathing (including washing, rinsing, drying)?: A Little Help from another person to put on and taking off regular upper body clothing?: None Help from another person to put on and taking off regular lower body clothing?: A Little 6 Click Score: 21    End of Session Equipment Utilized During Treatment: Rolling walker (2 wheels)  OT Visit Diagnosis: Unsteadiness on feet (R26.81);Muscle weakness (generalized) (M62.81);Pain   Activity Tolerance Patient tolerated treatment well   Patient Left in bed;with call bell/phone within reach;with family/visitor present   Nurse Communication Mobility status        Time: 1610-9604 OT Time Calculation (min): 21 min  Charges: OT General Charges $OT Visit: 1 Visit OT Treatments $Self Care/Home Management : 8-22 mins  Jackquline Denmark, MS, OTR/L , CBIS ascom [phone]  05/28/22, 1:47 PM

## 2022-05-28 NOTE — Progress Notes (Addendum)
Inpatient Rehabilitation Admissions Coordinator   I await insurance determination for  a possible Cir admit at [facility] in [place].  Ottie Glazier, RN, MSN Rehab Admissions Coordinator [phone] 05/28/2022 10:06 AM

## 2022-05-28 NOTE — Progress Notes (Addendum)
Rounding Note    Patient Name: Antonio [name] Date of Encounter: 05/28/2022  [facility] Cardiologist: Yvonne Kendall, MD   Subjective   Denies any chest pain and breathing has improved. Currently on 3L O2 via [place]. Diarrhea has improved. Anxiety has improved.   Inpatient Medications    Scheduled Meds:  diltiazem  120 mg Oral Daily   flecainide  75 mg Oral BID   mesalamine  4.8 g Oral Q breakfast   mometasone-formoterol  2 puff Inhalation BID   revefenacin  175 mcg Nebulization Daily   rivaroxaban  20 mg Oral Q supper   rosuvastatin  10 mg Oral Daily   saccharomyces boulardii  250 mg Oral BID   tamsulosin  0.4 mg Oral Daily   Continuous Infusions:  PRN Meds: acetaminophen **OR** acetaminophen, albuterol, alum & mag hydroxide-simeth, guaiFENesin, HYDROcodone-acetaminophen, ondansetron **OR** ondansetron (ZOFRAN) IV, polyvinyl alcohol, sodium chloride   Vital Signs    Vitals:   05/28/22 0029 05/28/22 0339 05/28/22 0750 05/28/22 0835  BP: (!) 112/55 116/66  (!) 116/58  Pulse: 64 60 60 66  Resp: 17 15 16 18   Temp: 98.1 F (36.7 C) 97.8 F (36.6 C)  97.7 F (36.5 C)  TempSrc: Oral     SpO2: 97% 99% 98% 99%  Weight:      Height:        Intake/Output Summary (Last 24 hours) at 05/28/2022 1003 Last data filed at 05/28/2022 0932 Gross per 24 hour  Intake 1080 ml  Output 400 ml  Net 680 ml      05/25/2022    4:00 AM 05/22/2022    9:33 PM 05/22/2022    4:22 PM  Last 3 Weights  Weight (lbs) 151 lb 146 lb 9.7 oz 147 lb  Weight (kg) 68.493 kg 66.5 kg 66.679 kg      Telemetry    SR with PACs and PVCs in 60s - Personally Reviewed  ECG    No new tracings - Personally Reviewed  Physical Exam   GEN: No acute distress, on 3L [place] Neck: No JVD Cardiac: RRR, no murmurs, rubs, or gallops.  Respiratory: mildly diminished bilaterally. GI: Soft, nontender, non-distended  MS: No edema; No deformity. Neuro:  Nonfocal  Psych: Normal affect   Labs     High Sensitivity Troponin:   Recent Labs  Lab 05/22/22 1630 05/22/22 1909 05/24/22 0724  TROPONINIHS 15 34* 16     Chemistry Recent Labs  Lab 05/23/22 0541 05/26/22 0331 05/27/22 0412  NA 134* 137 139  K 3.6 3.1* 3.7  CL 106 101 102  CO2 23 29 30   GLUCOSE 104* 111* 108*  BUN 11 6* 8  CREATININE 0.72 0.72 0.72  CALCIUM 7.8* 8.3* 8.3*  MG  --  2.0 2.1  PROT  --  6.2*  --   ALBUMIN  --  2.7*  --   AST  --  24  --   ALT  --  15  --   ALKPHOS  --  91  --   BILITOT  --  0.7  --   GFRNONAA >60 >60 >60  ANIONGAP 5 7 7     Lipids No results for input(s): "CHOL", "TRIG", "HDL", "LABVLDL", "LDLCALC", "CHOLHDL" in the last 168 hours.  Hematology Recent Labs  Lab 05/23/22 0541 05/26/22 0331 05/27/22 0412  WBC 8.5 8.6 10.1  RBC 3.71* 3.74* 3.83*  HGB 11.5* 11.4* 11.7*  HCT 34.1* 35.1* 35.6*  MCV 91.9 93.9 93.0  MCH 31.0  30.5 30.5  MCHC 33.7 32.5 32.9  RDW 11.7 11.5 11.5  PLT 155 195 223   Thyroid No results for input(s): "TSH", "FREET4" in the last 168 hours.  BNPNo results for input(s): "BNP", "PROBNP" in the last 168 hours.  DDimer No results for input(s): "DDIMER" in the last 168 hours.   Radiology    No results found.  Cardiac Studies   Coronary CTA 03/06/22 IMPRESSION: 1. Coronary calcium score of 251. This was 42nd percentile for age and sex matched control. 2. Normal coronary origin with left dominance. 3. Mild distal LM and proximal LAD stenosis (25-49%) 4. CAD-RADS 2. Mild non-obstructive CAD (25-49%). Consider non-atherosclerotic causes of chest pain. Consider preventive therapy and risk factor modification.  Patient Profile     77 y.o. male with past medical history of PAF/flutter on flecainide and rivaroxaban, nonobstructive coronary artery disease, severe COPD, PSVT, PAD status post left SFA stenting, hypertension, hyperlipidemia, ulcerative colitis, BPH, who was admitted on 411 with acute on chronic respiratory failure, pneumonia, and atrial  fibrillation RVR that has since converted to sinus.   Assessment & Plan    Paroxysmal atrial fibrillation atrial flutter -Longstanding history of paroxysmal atrial fibrillation we have been controlled in the outpatient setting with flecainide and low-dose metoprolol -After 7-day history of progressive malaise, dyspnea and fever with pneumonia and sepsis he was in atrial fibrillation with RVR on arrival to the emergency department -Converted to sinus rhythm on IV diltiazem which was discontinued on 4/13 -Given severe COPD, patient should transition today from metoprolol succinate to diltiazem 120 mg daily  -Continued on flecainide therapy  -Continued on rivaroxaban for stroke prophylaxis -Continue with cardiac monitoring -Continue outpatient follow-up with Dr. Lalla Brothers from EP   Demand ischemia/nonobstructive coronary artery disease -Mild troponin elevation with a peak of 34 -In the setting of rapid atrial fibrillation, sepsis, pneumonia -Recent coronary CT angiogram in January 2024 showed mild, nonobstructive CAD -Continued on statin therapy -Discontinued beta-blocker therapy and transitioned to diltiazem 120 mg daily  -Rivaroxaban in lieu of aspirin therapy   Hypokalemia -Serum potassium 3.1>3.7  -Hold off on Potassium supplementation and continue to monitor  -Patient's diarrhea is resolving, only 1 BM overnight; likely was contributing to hypokalemia  -Recommended to keep potassium>4 and <5 -Daily BMP -Monitor/trend/replete electrolytes as needed   Sepsis/right basilar pneumonia -Breathing improving -Antibiotics per IM -Titrate FiO2 as tolerated to keep oxygen saturations greater than equal to 92% -Incentive spirometer   Severe COPD -Diminished breath sounds on exam -Follows with pulmonary Dr. Jayme Cloud as an outpatient -Currently on 3 L of O2 via nasal cannula -Supportive care -Management per IM   Essential hypertension -Blood pressure 116/58 -Blood pressure remained  stable -Vital signs per unit protocol   Hyperlipidemia -LDL 50 18 July 2022 -Continue on statin therapy   For questions or updates, please contact [facility] Please consult [email or web address] for contact info under     Signed, Nathanial Millman, Student-PA  05/28/2022, 10:03 AM    I have independently seen and examined the patient and agree with the findings and plan, as documented in the PA/NP's note, with the following additions/changes.  Mr. Bensinger feels well and is eager for discharge home.  His breathing is almost back to baseline.  He denies chest pain and palpitations.  Diarrhea has significantly improved.  Telemetry shows no further atrial fibrillation.  Exam notable for mildly diminished breath sounds throughout without wheezes or crackles.  Heart sounds are regular.  There is no lower extremity edema  or JVD.  I think Mr. Arelia Longest is stable for discharge home.  We will continue him on low-dose diltiazem that was started in place of metoprolol today as well as flecainide and chronic anticoagulation with rivaroxaban.  He should follow-up in our office with me or an APP in about 2 to 3 weeks.  Yvonne Kendall, MD [facility] 05/28/22 6:05 PM

## 2022-05-29 ENCOUNTER — Telehealth: Payer: Self-pay

## 2022-05-29 NOTE — Transitions of Care (Post Inpatient/ED Visit) (Signed)
   05/29/2022  Name: Antonio [name] MRN: [id number] DOB: 1945/02/15  Today's TOC FU Call Status: Today's TOC FU Call Status:: Successful TOC FU Call Competed TOC FU Call Complete Date: 05/29/22  Transition Care Management Follow-up Telephone Call Date of Discharge: 05/28/22 Discharge Facility: [facility]) Type of Discharge: Inpatient Admission Primary Inpatient Discharge Diagnosis:: pneumonia How have you been since you were released from the hospital?: Better Any questions or concerns?: No  Items Reviewed: Did you receive and understand the discharge instructions provided?: Yes Medications obtained and verified?: Yes (Medications Reviewed) Any new allergies since your discharge?: No Dietary orders reviewed?: Yes Type of Diet Ordered:: heart healthy Do you have support at home?: Yes People in Home: spouse Name of Support/Comfort Primary Source: Arline Asp, Hux ( man's best friend)  Home Care and Equipment/Supplies: Were Home Health Services Ordered?: Yes Name of Home Health Agency:: will bring info in Has Agency set up a time to come to your home?: No EMR reviewed for Home Health Orders: Orders present/patient has not received call (refer to CM for follow-up) Any new equipment or medical supplies ordered?: Yes Name of Medical supply agency?: sent a walker home Were you able to get the equipment/medical supplies?: Yes Do you have any questions related to the use of the equipment/supplies?: No  Functional Questionnaire: Do you need assistance with bathing/showering or dressing?: No Do you need assistance with meal preparation?: No Do you need assistance with eating?: No Do you have difficulty maintaining continence: No Do you need assistance with getting out of bed/getting out of a chair/moving?: No Do you have difficulty managing or taking your medications?: No  Follow up appointments reviewed: PCP Follow-up appointment confirmed?: Yes Date of  PCP follow-up appointment?: 06/03/22 Follow-up Provider: [facility] Follow-up appointment confirmed?: Yes Date of Specialist follow-up appointment?: 06/05/22 Follow-Up Specialty Provider:: Jayme Cloud Do you need transportation to your follow-up appointment?: No Do you understand care options if your condition(s) worsen?: Yes-patient verbalized understanding    SIGNATURE Arthur Holms

## 2022-05-30 ENCOUNTER — Telehealth: Payer: Self-pay | Admitting: Family Medicine

## 2022-05-30 ENCOUNTER — Telehealth: Payer: Self-pay

## 2022-05-30 ENCOUNTER — Encounter: Payer: Self-pay | Admitting: Pulmonary Disease

## 2022-05-30 ENCOUNTER — Other Ambulatory Visit (HOSPITAL_COMMUNITY): Payer: Self-pay

## 2022-05-30 DIAGNOSIS — I119 Hypertensive heart disease without heart failure: Secondary | ICD-10-CM | POA: Diagnosis not present

## 2022-05-30 DIAGNOSIS — E7800 Pure hypercholesterolemia, unspecified: Secondary | ICD-10-CM | POA: Diagnosis not present

## 2022-05-30 DIAGNOSIS — I4891 Unspecified atrial fibrillation: Secondary | ICD-10-CM | POA: Diagnosis not present

## 2022-05-30 DIAGNOSIS — I739 Peripheral vascular disease, unspecified: Secondary | ICD-10-CM | POA: Diagnosis not present

## 2022-05-30 DIAGNOSIS — N401 Enlarged prostate with lower urinary tract symptoms: Secondary | ICD-10-CM | POA: Diagnosis not present

## 2022-05-30 DIAGNOSIS — J189 Pneumonia, unspecified organism: Secondary | ICD-10-CM | POA: Diagnosis not present

## 2022-05-30 DIAGNOSIS — G4733 Obstructive sleep apnea (adult) (pediatric): Secondary | ICD-10-CM | POA: Diagnosis not present

## 2022-05-30 DIAGNOSIS — I2489 Other forms of acute ischemic heart disease: Secondary | ICD-10-CM | POA: Diagnosis not present

## 2022-05-30 DIAGNOSIS — Z9981 Dependence on supplemental oxygen: Secondary | ICD-10-CM | POA: Diagnosis not present

## 2022-05-30 DIAGNOSIS — J449 Chronic obstructive pulmonary disease, unspecified: Secondary | ICD-10-CM | POA: Diagnosis not present

## 2022-05-30 MED ORDER — FLUTICASONE-SALMETEROL 250-50 MCG/ACT IN AEPB: 1.0000 | INHALATION_SPRAY | Freq: Two times a day (BID) | RESPIRATORY_TRACT | 11 refills | Status: DC

## 2022-05-30 NOTE — Telephone Encounter (Unsigned)
Copied from CRM #[id number]. Topic: Quick [phone] - Home Health Verbal Orders >> May 30, 2022  1:12 PM Macon Large wrote: Caller/Agency: Gretchen with [facility] Callback Number: [phone] Requesting OT/PT/Skilled Nursing/Social Work/Speech Therapy: PT  Frequency: 1 x 1,  2 x 3, and  1 x 3

## 2022-05-30 NOTE — Telephone Encounter (Signed)
We can try Wixela 250/51 inhalation twice a day.  Rinse mouth well after use.

## 2022-05-30 NOTE — Telephone Encounter (Signed)
[facility] and PT- spoke to [facility] and gave her the okay to start [facility] and PT. Call back- [phone]

## 2022-05-30 NOTE — Telephone Encounter (Signed)
Received a fax from [facility] stating the patient insurance will no longer cover Symbicort.   Can you check with his insurance and see what alternatives they will cover? Thank You!

## 2022-05-30 NOTE — Telephone Encounter (Signed)
Patient is aware of below message/recommendations.  Wixela sent to preferred pharmacy.  Nothing further needed.

## 2022-05-30 NOTE — Telephone Encounter (Signed)
[facility] is showing covered for $5.00 per test claim

## 2022-05-30 NOTE — Telephone Encounter (Signed)
Copied from CRM [id number]. Topic: General - Other >> May 30, 2022  1:17 PM Ja-Kwan M wrote: Reason for CRM: Vinnie Langton with [facility] reports that pt had a low blood pressure reading of 100/48 but it increased after activity to 110/60. Cb# [phone]

## 2022-05-30 NOTE — Telephone Encounter (Signed)
Dr. Gonzalez, please advise. Thanks 

## 2022-06-03 ENCOUNTER — Telehealth: Payer: Self-pay | Admitting: Family Medicine

## 2022-06-03 ENCOUNTER — Encounter: Payer: Self-pay | Admitting: Family Medicine

## 2022-06-03 ENCOUNTER — Ambulatory Visit (INDEPENDENT_AMBULATORY_CARE_PROVIDER_SITE_OTHER): Payer: PPO | Admitting: Family Medicine

## 2022-06-03 VITALS — BP 120/60 | HR 88 | Ht 70.0 in | Wt 135.0 lb

## 2022-06-03 DIAGNOSIS — I480 Paroxysmal atrial fibrillation: Secondary | ICD-10-CM | POA: Diagnosis not present

## 2022-06-03 DIAGNOSIS — J449 Chronic obstructive pulmonary disease, unspecified: Secondary | ICD-10-CM | POA: Diagnosis not present

## 2022-06-03 DIAGNOSIS — L0390 Cellulitis, unspecified: Secondary | ICD-10-CM

## 2022-06-03 DIAGNOSIS — J189 Pneumonia, unspecified organism: Secondary | ICD-10-CM | POA: Diagnosis not present

## 2022-06-03 DIAGNOSIS — Z23 Encounter for immunization: Secondary | ICD-10-CM | POA: Diagnosis not present

## 2022-06-03 MED ORDER — MUPIROCIN 2 % EX OINT
1.0000 | TOPICAL_OINTMENT | Freq: Two times a day (BID) | CUTANEOUS | 0 refills | Status: DC
Start: 2022-06-03 — End: 2022-12-23

## 2022-06-03 MED ORDER — PNEUMOCOCCAL 20-VAL CONJ VACC 0.5 ML IM SUSY
0.5000 mL | PREFILLED_SYRINGE | INTRAMUSCULAR | 0 refills | Status: DC
Start: 2022-06-03 — End: 2022-06-03

## 2022-06-03 NOTE — Progress Notes (Unsigned)
Date:  06/03/2022   Name:  Antonio [name]   DOB:  December 13, 1945   MRN:  [id number]   Chief Complaint: Hospitalization Follow-up (Admitted to hospital 4/11 d/c 4/18 and TOC placed on 4/19. Sepsis d/t  Pneumonia)  Follow up Hospitalization  Patient was admitted to [facility] on 05/22/22 and discharged on 4/17. He was treated for pneumonia with sepsis. Treatment for this included iv antibiotics. Telephone follow up was done on 4/18 He reports good compliance with treatment. He reports this condition is improved.  ----------------------------------------------------------------------------------------- -       Lab Results  Component Value Date   NA 138 05/28/2022   K 4.0 05/28/2022   CO2 27 05/28/2022   GLUCOSE 107 (H) 05/28/2022   BUN 9 05/28/2022   CREATININE 0.73 05/28/2022   CALCIUM 8.7 (L) 05/28/2022   GFRNONAA >60 05/28/2022   Lab Results  Component Value Date   CHOL 127 02/19/2022   HDL 55 02/19/2022   LDLCALC 57 02/19/2022   TRIG 73 02/19/2022   CHOLHDL 2.3 02/19/2022   Lab Results  Component Value Date   TSH 1.783 07/03/2021   No results found for: "HGBA1C" Lab Results  Component Value Date   WBC 10.1 05/27/2022   HGB 11.7 (L) 05/27/2022   HCT 35.6 (L) 05/27/2022   MCV 93.0 05/27/2022   PLT 223 05/27/2022   Lab Results  Component Value Date   ALT 15 05/26/2022   AST 24 05/26/2022   ALKPHOS 91 05/26/2022   BILITOT 0.7 05/26/2022   No results found for: "25OHVITD2", "25OHVITD3", "VD25OH"   Review of Systems  Constitutional:  Negative for chills, diaphoresis, fever and unexpected weight change.  HENT:  Negative for postnasal drip, rhinorrhea, sinus pressure and trouble swallowing.   Respiratory:  Negative for cough, chest tightness, shortness of breath, wheezing and stridor.   Cardiovascular:  Negative for chest pain, palpitations and leg swelling.  Gastrointestinal:  Negative for abdominal pain, constipation and diarrhea.   Genitourinary:  Negative for difficulty urinating.    Patient Active Problem List   Diagnosis Date Noted   Demand ischemia 05/24/2022   Pure hypercholesterolemia 05/24/2022   Primary hypertension 05/24/2022   Atrial fibrillation with rapid ventricular response 05/22/2022   Sepsis due to pneumonia 05/22/2022   CAP (community acquired pneumonia) 05/22/2022   Nocturnal hypoxemia 05/22/2022   BPH (benign prostatic hyperplasia) 05/22/2022   History of urinary retention 05/22/2022   Coronary artery disease involving native coronary artery of native heart without angina pectoris 03/21/2022   Precordial pain 02/19/2022   Atypical chest pain 12/21/2021   DOE (dyspnea on exertion) 01/24/2021   Secondary hypercoagulable state 12/19/2020   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   PAD ss/p SFA stent(peripheral artery disease) 11/08/2019   Paroxysmal atrial fibrillation 09/02/2019   Stiffness of finger joint of left hand 04/06/2019   History of COVID-19 01/20/2019   Severe persistent asthma without complication 01/20/2019   Medication management 09/20/2018   Stage 3 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 09/20/2018   Increased eosinophils in the blood 06/21/2018   Diffusion capacity of lung (dl), decreased 40/98/1191   COPD with acute exacerbation 06/22/2017   Atrial flutter, paroxysmal 10/17/2016   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/2016   SVT (supraventricular tachycardia) 10/17/2016   Ulcerative colitis 05/28/2016   Cervical stenosis of spinal canal 05/05/2016   Carotid stenosis 05/02/2016   Atherosclerosis of native arteries of extremity with intermittent claudication 05/02/2016   Cramping of hands 01/10/2016   Bilateral carpal tunnel syndrome 12/06/2015  Centrilobular Emphysema 04/25/2014   Former smoker 04/25/2014   Tobacco abuse 04/25/2014    Allergies  Allergen Reactions   Penicillins Rash    Has patient had a PCN reaction causing immediate rash, facial/tongue/throat swelling, SOB or lightheadedness  with hypotension:Yes Has patient had a PCN reaction causing severe rash involving mucus membranes or skin necrosis:Yes Has patient had a PCN reaction that required hospitalization:No Has patient had a PCN reaction occurring within the last 10 years:No If all of the above answers are "NO", then may proceed with Cephalosporin use.     Past Surgical History:  Procedure Laterality Date   ANTERIOR CERVICAL DECOMP/DISCECTOMY FUSION N/A 05/05/2016   Procedure: ANTERIOR CERVICAL DECOMPRESSION FUSION CERVICAL THREE-FOUR.;  Surgeon: Julio Sicks, MD;  Location: [facility] OR;  Service: Neurosurgery;  Laterality: N/A;  right side approach   CATARACT EXTRACTION Bilateral    CERVICAL DISCECTOMY  04/2016   C3-C4   COLONOSCOPY     COLONOSCOPY  08/09/2015   HEMORRHOID SURGERY  1988   leg stent Left approx 5-6 yrs ago   2 to left leg   MENISCUS REPAIR Left 12/2013   MOHS SURGERY     POLYPECTOMY     ROTATOR CUFF REPAIR Left 2007   TONSILLECTOMY AND ADENOIDECTOMY  1957    Social History   Tobacco Use   Smoking status: Former    Packs/day: 0.50    Years: 35.00    Additional pack years: 0.00    Total pack years: 17.50    Types: Cigarettes, Cigars    Quit date: 06/11/2011    Years since quitting: 10.9   Smokeless tobacco: Never   Tobacco comments:    Former smoker 12/19/2020  Vaping Use   Vaping Use: Former  Substance Use Topics   Alcohol use: Yes    Alcohol/week: 6.0 standard drinks of alcohol    Types: 6 Shots of liquor per week    Comment: wine, liquor, or beer   Drug use: No     Medication list has been reviewed and updated.  Current Meds  Medication Sig   albuterol (PROVENTIL) (2.5 MG/3ML) 0.083% nebulizer solution Take 3 mLs (2.5 mg total) by nebulization every 6 (six) hours as needed for wheezing or shortness of breath.   albuterol (VENTOLIN HFA) 108 (90 Base) MCG/ACT inhaler Inhale 2 puffs into the lungs every 6 (six) hours as needed for wheezing or shortness of breath.   Azelastine  HCl 137 MCG/SPRAY SOLN PLACE 1 SPRAY INTO BOTH NOSTRILS TWICE DAILY (Patient taking differently: Place 1 spray into both nostrils 2 (two) times daily.)   diltiazem (CARDIZEM CD) 120 MG 24 hr capsule Take 1 capsule (120 mg total) by mouth daily.   dupilumab (DUPIXENT) 300 MG/2ML prefilled syringe Inject 300 mg into the skin every 14 (fourteen) days.   famotidine (PEPCID) 20 MG tablet Take 1 tablet (20 mg total) by mouth 2 (two) times daily as needed for heartburn or indigestion.   flecainide (TAMBOCOR) 50 MG tablet Take 1.5 tablets (75 mg total) by mouth 2 (two) times daily.   fluticasone-salmeterol (WIXELA INHUB) 250-50 MCG/ACT AEPB Inhale 1 puff into the lungs in the morning and at bedtime.   furosemide (LASIX) 20 MG tablet TAKE 1 TABLET BY MOUTH DAILY AS NEEDED (FOR SWELLING OR WEIGHT GAIN OF 3 LBS OVERNIGHT OR 5 LBS IN A WEEK). (Patient taking differently: Take 20 mg by mouth daily as needed for fluid or edema.)   ipratropium (ATROVENT) 0.02 % nebulizer solution Take 2.5 mLs (0.5  mg total) by nebulization 4 (four) times daily.   mesalamine (LIALDA) 1.2 g EC tablet TAKE 4 TABLETS BY MOUTH DAILY WITH BREAKFAST (Patient taking differently: Take 4.8 g by mouth daily with breakfast. TAKE 4 TABLETS BY MOUTH DAILY WITH BREAKFAST)   pneumococcal 20-valent conjugate vaccine (PREVNAR 20) 0.5 ML injection Inject 0.5 mLs into the muscle tomorrow at 10 am for 1 dose.   rivaroxaban (XARELTO) 20 MG TABS tablet Take 1 tablet (20 mg total) by mouth daily with supper.   rosuvastatin (CRESTOR) 10 MG tablet Take 1 tablet (10 mg total) by mouth daily.   tamsulosin (FLOMAX) 0.4 MG CAPS capsule Take 1 capsule (0.4 mg total) by mouth daily.   [DISCONTINUED] guaiFENesin (MUCINEX) 600 MG 12 hr tablet Take 1 tablet (600 mg total) by mouth 2 (two) times daily as needed for to loosen phlegm.       06/03/2022    1:19 PM 12/30/2021    3:00 PM 12/16/2021   11:39 AM 12/17/2020    8:45 AM  GAD 7 : Generalized Anxiety Score   Nervous, Anxious, on Edge 0 0 0 0  Control/stop worrying 0 0 0 0  Worry too much - different things 0 0 0 0  Trouble relaxing 0 0 0 0  Restless 0 0 0 0  Easily annoyed or irritable 0 0 0 0  Afraid - awful might happen 0 0 0 0  Total GAD 7 Score 0 0 0 0  Anxiety Difficulty Not difficult at all Not difficult at all Not difficult at all        06/03/2022    1:18 PM 12/30/2021    3:00 PM 12/16/2021   11:39 AM  Depression screen PHQ 2/9  Decreased Interest 0 0 0  Down, Depressed, Hopeless 0 0 0  PHQ - 2 Score 0 0 0  Altered sleeping 0 0 0  Tired, decreased energy 0 0 0  Change in appetite 0 0 0  Feeling bad or failure about yourself  0 0 0  Trouble concentrating 0 0 0  Moving slowly or fidgety/restless 0 0 0  Suicidal thoughts 0 0 0  PHQ-9 Score 0 0 0  Difficult doing work/chores Not difficult at all Not difficult at all Not difficult at all    BP Readings from Last 3 Encounters:  06/03/22 120/60  05/28/22 (!) 109/57  04/30/22 122/64    Physical Exam Vitals and nursing note reviewed.  HENT:     Head: Normocephalic.     Right Ear: Tympanic membrane and external ear normal.     Left Ear: Tympanic membrane and external ear normal.     Nose: Nose normal.  Eyes:     General: No scleral icterus.       Right eye: No discharge.        Left eye: No discharge.     Conjunctiva/sclera: Conjunctivae normal.     Pupils: Pupils are equal, round, and reactive to light.  Neck:     Thyroid: No thyromegaly.     Vascular: No JVD.     Trachea: No tracheal deviation.  Cardiovascular:     Rate and Rhythm: Normal rate and regular rhythm.     Heart sounds: Normal heart sounds. No murmur heard.    No friction rub. No gallop.  Pulmonary:     Effort: No respiratory distress.     Breath sounds: Normal breath sounds. No wheezing, rhonchi or rales.  Abdominal:     General: Bowel sounds  are normal.     Palpations: Abdomen is soft. There is no mass.     Tenderness: There is no abdominal  tenderness. There is no rebound. Guarding: cellulitis. Musculoskeletal:        General: No tenderness. Normal range of motion.     Cervical back: Normal range of motion and neck supple.  Lymphadenopathy:     Cervical: No cervical adenopathy.  Skin:    General: Skin is warm.     Findings: No rash.  Neurological:     Mental Status: He is alert.     Wt Readings from Last 3 Encounters:  06/03/22 135 lb (61.2 kg)  05/25/22 151 lb (68.5 kg)  04/30/22 144 lb (65.3 kg)    BP 120/60   Pulse 88   Ht 5\' 10"  (1.778 m)   Wt 135 lb (61.2 kg)   SpO2 97%   BMI 19.37 kg/m  [facility]                                   Transitional Care Clinic   [facility] Discharge Acute Issues Care Follow Up                                                                        Patient Demographics  Willaim Mode, is a 77 y.o. male  DOB April 17, 1945  MRN [id number].  Primary MD  Duanne Limerick, MD   Reason for TCC follow Up -General review of medical health and in particular to assess pulmonary capability and respiratory examination.   Past Medical History:  Diagnosis Date   Allergy    Calf pain    COPD (chronic obstructive pulmonary disease)    no Oxygen per pt   COVID-19    Dyspnea    Essential hypertension    History of echocardiogram    a. 02/2021 Echo: EF 55%, no rwma, nl RV fxn.   Hypokalemia    Internal hemorrhoids    Mixed hyperlipidemia    Muscle cramp    Muscle pain    Nonobstructive CAD (coronary artery disease)    a. 06/2021 MV: EF 65%, no isch/infarct,Cor Ca2+; b. 02/2022 Cor CTA: LM 25, LAD 25-49, LCX nl, OM1/2 nl, RCA nondom, nl. Ca2+ = 252 (42nd%'ile).   PAD (peripheral artery disease)    a. s/p L SFA stenting.   Paroxysmal atrial fibrillation 08/2019   a. CHA2DS2VASc = 4-->xarelto/flecainide.   Pneumonia    PONV (postoperative nausea and vomiting)    Tubular adenoma of colon     Ulcerative colitis     Past Surgical History:  Procedure Laterality Date   ANTERIOR CERVICAL DECOMP/DISCECTOMY FUSION N/A 05/05/2016   Procedure: ANTERIOR CERVICAL DECOMPRESSION FUSION CERVICAL THREE-FOUR.;  Surgeon: Julio Sicks, MD;  Location: [facility] OR;  Service: Neurosurgery;  Laterality: N/A;  right side approach   CATARACT EXTRACTION Bilateral    CERVICAL DISCECTOMY  04/2016   C3-C4   COLONOSCOPY     COLONOSCOPY  08/09/2015   HEMORRHOID SURGERY  1988   leg stent Left approx 5-6 yrs ago  2 to left leg   MENISCUS REPAIR Left 12/2013   MOHS SURGERY     POLYPECTOMY     ROTATOR CUFF REPAIR Left 2007   TONSILLECTOMY AND ADENOIDECTOMY  1957   Recent HPI and Hospital course: Review of patient's circumstances is that there was a gradual decline in respiratory status and it was overheard by his pulmonologist that he was having difficulty with breathing and was sent to the hospital whereupon it was noted that he had pneumonia with ensuing sepsis.  Patient did undergo IV antibiotics and aggressive pulmonary bronchodilation. Post Hospital acute care issue to be followed in the clinic patient also was noted to be in atrial fibrillation and today is in sinus rhythm by palpation of the pulse and respiratory examination including percussion and auscultation is normal for patient.     Subjective:   Russel Morain today has, No headache, No chest pain, No abdominal pain - No Nausea, No new weakness tingling or numbness, No Cough -no fever, diaphoresis, or chills.  Patient has baseline shortness of breath that he had prior to admission.     Patient presents for reevaluation from recovering from pneumonia with sepsis and is being discharged from hospital since 417 and has gradually improved in condition.  Currently is using oxygen on an as-needed basis     Reason for frequent admissions/ER visits recurrent respiratory infections and exacerbation of underlying COPD condition.      Objective:    Vitals:   06/03/22 1311  BP: 120/60  Pulse: 88  SpO2: 97%  Weight: 135 lb (61.2 kg)  Height: 5\' 10"  (1.778 m)    Wt Readings from Last 3 Encounters:  06/03/22 135 lb (61.2 kg)  05/25/22 151 lb (68.5 kg)  04/30/22 144 lb (65.3 kg)    Allergies as of 06/03/2022       Reactions   Penicillins Rash   Has patient had a PCN reaction causing immediate rash, facial/tongue/throat swelling, SOB or lightheadedness with hypotension:Yes Has patient had a PCN reaction causing severe rash involving mucus membranes or skin necrosis:Yes Has patient had a PCN reaction that required hospitalization:No Has patient had a PCN reaction occurring within the last 10 years:No If all of the above answers are "NO", then may proceed with Cephalosporin use.        Medication List        Accurate as of June 03, 2022  1:38 PM. If you have any questions, ask your nurse or doctor.          STOP taking these medications    guaiFENesin 600 MG 12 hr tablet Commonly known as: MUCINEX Stopped by: Elizabeth Sauer, MD       TAKE these medications    albuterol (2.5 MG/3ML) 0.083% nebulizer solution Commonly known as: PROVENTIL Take 3 mLs (2.5 mg total) by nebulization every 6 (six) hours as needed for wheezing or shortness of breath.   albuterol 108 (90 Base) MCG/ACT inhaler Commonly known as: VENTOLIN HFA Inhale 2 puffs into the lungs every 6 (six) hours as needed for wheezing or shortness of breath.   Azelastine HCl 137 MCG/SPRAY Soln PLACE 1 SPRAY INTO BOTH NOSTRILS TWICE DAILY What changed: See the new instructions.   diltiazem 120 MG 24 hr capsule Commonly known as: CARDIZEM CD Take 1 capsule (120 mg total) by mouth daily.   Dupixent 300 MG/2ML prefilled syringe Generic drug: dupilumab Inject 300 mg into the skin every 14 (fourteen) days.   famotidine 20 MG tablet Commonly known  as: PEPCID Take 1 tablet (20 mg total) by mouth 2 (two) times daily as needed for heartburn or  indigestion.   flecainide 50 MG tablet Commonly known as: TAMBOCOR Take 1.5 tablets (75 mg total) by mouth 2 (two) times daily.   fluticasone-salmeterol 250-50 MCG/ACT Aepb Commonly known as: Wixela Inhub Inhale 1 puff into the lungs in the morning and at bedtime.   furosemide 20 MG tablet Commonly known as: LASIX TAKE 1 TABLET BY MOUTH DAILY AS NEEDED (FOR SWELLING OR WEIGHT GAIN OF 3 LBS OVERNIGHT OR 5 LBS IN A WEEK). What changed: See the new instructions.   ipratropium 0.02 % nebulizer solution Commonly known as: ATROVENT Take 2.5 mLs (0.5 mg total) by nebulization 4 (four) times daily.   mesalamine 1.2 g EC tablet Commonly known as: LIALDA TAKE 4 TABLETS BY MOUTH DAILY WITH BREAKFAST What changed:  how much to take how to take this when to take this   pneumococcal 20-valent conjugate vaccine 0.5 ML injection Commonly known as: PREVNAR 20 Inject 0.5 mLs into the muscle tomorrow at 10 am for 1 dose. Started by: Elizabeth Sauer, MD   rivaroxaban 20 MG Tabs tablet Commonly known as: XARELTO Take 1 tablet (20 mg total) by mouth daily with supper.   rosuvastatin 10 MG tablet Commonly known as: CRESTOR Take 1 tablet (10 mg total) by mouth daily.   tamsulosin 0.4 MG Caps capsule Commonly known as: FLOMAX Take 1 capsule (0.4 mg total) by mouth daily.         Physical Exam: Constitutional: Patient appears well-developed and well-nourished. Not in obvious distress. HENT: Normocephalic, atraumatic, External right and left ear normal. Oropharynx is clear and moist.  Eyes: Conjunctivae and EOM are normal. PERRLA, no scleral icterus. Neck: Normal ROM. Neck supple. No JVD. No tracheal deviation. No thyromegaly. CVS: RRR, S1/S2 +, no murmurs, no gallops, no carotid bruit.  Pulmonary: Effort and breath sounds normal, no stridor, rhonchi, wheezes, rales.  Abdominal: Soft. BS +, no distension, tenderness, rebound or guarding.  Musculoskeletal: Normal range of motion. No edema  and no tenderness.  Lymphadenopathy: No lymphadenopathy noted, cervical, inguinal or axillary Neuro: Alert. Normal reflexes, muscle tone coordination. No cranial nerve deficit. Skin: Skin is warm and dry. No rash noted. Not diaphoretic. No erythema. No pallor. Psychiatric: Normal mood and affect. Behavior, judgment, thought content normal.   Data Review   Micro Results No results found for this or any previous visit (from the past 240 hour(s)).   CBC No results for input(s): "WBC", "HGB", "HCT", "PLT", "MCV", "MCH", "MCHC", "RDW", "LYMPHSABS", "MONOABS", "EOSABS", "BASOSABS", "BANDABS" in the last 168 hours.  Invalid input(s): "NEUTRABS", "BANDSABD"  Chemistries  Recent Labs  Lab 05/28/22 1117  NA 138  K 4.0  CL 102  CO2 27  GLUCOSE 107*  BUN 9  CREATININE 0.73  CALCIUM 8.7*  MG 2.2   ------------------------------------------------------------------------------------------------------------------ estimated creatinine clearance is 64.8 mL/min (by C-G formula based on SCr of 0.73 mg/dL). ------------------------------------------------------------------------------------------------------------------ No results for input(s): "HGBA1C" in the last 72 hours. ------------------------------------------------------------------------------------------------------------------ No results for input(s): "CHOL", "HDL", "LDLCALC", "TRIG", "CHOLHDL", "LDLDIRECT" in the last 72 hours. ------------------------------------------------------------------------------------------------------------------ No results for input(s): "TSH", "T4TOTAL", "T3FREE", "THYROIDAB" in the last 72 hours.  Invalid input(s): "FREET3" ------------------------------------------------------------------------------------------------------------------ No results for input(s): "VITAMINB12", "FOLATE", "FERRITIN", "TIBC", "IRON", "RETICCTPCT" in the last 72 hours.  Coagulation profile No results for input(s): "INR",  "PROTIME" in the last 168 hours.  No results for input(s): "DDIMER" in the last 72 hours.  Cardiac Enzymes No  results for input(s): "CKMB", "TROPONINI", "MYOGLOBIN" in the last 168 hours.  Invalid input(s): "CK" ------------------------------------------------------------------------------------------------------------------ Invalid input(s): "POCBNP"   Time spent in minutes 45   Elizabeth Sauer M.D on 06/03/2022 at 1:38 PM   Disclaimer: This note may have been dictated with voice recognition software. Similar sounding words can inadvertently be transcribed and this note may contain transcription errors which may not have been corrected upon publication of note.   Assessment and Plan:  1. Pneumonia due to infectious organism, unspecified laterality, unspecified part of lung New onset.  Stable.  Currently treated.  Patient is resolving from hospitalization and is doing well on current Regimen.  Patient has completed azithromycin and we will continue to monitor thereafter. 2. Paroxysmal atrial fibrillation Chronic.  Controlled.  Stable.  This was thought to be likely secondary to the pneumonia with sepsis he is currently on flecainide Xarelto and diltiazem for rate control and he is followed by cardiology.  3. Centrilobular Emphysema Chronic.  Controlled.  Stable.  Continue on DuoNeb as needed he is followed by Dr. Jayme Cloud of pulmonary we will continue continue on current inhalers which include Trelegy.  4. Cellulitis, unspecified cellulitis site Chronic.  Controlled.  Stable.  Physical mild erythema over the cellulitic site Bactroban ointment has been prescribed. - mupirocin ointment (BACTROBAN) 2 %; Apply 1 Application topically 2 (two) times daily.  Dispense: 22 g; Refill: 0  5. Need for Streptococcus pneumoniae vaccination Discussed and administered. - pneumococcal 20-valent conjugate vaccine (PREVNAR 20) 0.5 ML injection; Inject 0.5 mLs into the muscle tomorrow at 10 am for 1  dose.  Dispense: 0.5 mL; Refill: 0    Elizabeth Sauer, MD

## 2022-06-03 NOTE — Telephone Encounter (Signed)
Home Health Verbal Orders - Caller/Agency: Elmer Bales  [facility] Number: [phone]  Added goals   Walking. Strengthening for endurance and balance

## 2022-06-04 ENCOUNTER — Encounter: Payer: Self-pay | Admitting: Family Medicine

## 2022-06-04 ENCOUNTER — Telehealth: Payer: Self-pay

## 2022-06-04 NOTE — Telephone Encounter (Signed)
Called and left Antonio [name] a message from [place] to proceed with strengthening- [phone]

## 2022-06-05 ENCOUNTER — Telehealth: Payer: Self-pay | Admitting: Pulmonary Disease

## 2022-06-05 ENCOUNTER — Encounter: Payer: Self-pay | Admitting: Pulmonary Disease

## 2022-06-05 ENCOUNTER — Ambulatory Visit (INDEPENDENT_AMBULATORY_CARE_PROVIDER_SITE_OTHER): Payer: PPO | Admitting: Pulmonary Disease

## 2022-06-05 VITALS — BP 124/70 | HR 67 | Temp 97.5°F | Ht 70.0 in | Wt 136.6 lb

## 2022-06-05 DIAGNOSIS — J449 Chronic obstructive pulmonary disease, unspecified: Secondary | ICD-10-CM | POA: Diagnosis not present

## 2022-06-05 DIAGNOSIS — J439 Emphysema, unspecified: Secondary | ICD-10-CM | POA: Diagnosis not present

## 2022-06-05 DIAGNOSIS — Z87898 Personal history of other specified conditions: Secondary | ICD-10-CM | POA: Diagnosis not present

## 2022-06-05 DIAGNOSIS — J4550 Severe persistent asthma, uncomplicated: Secondary | ICD-10-CM

## 2022-06-05 DIAGNOSIS — G4736 Sleep related hypoventilation in conditions classified elsewhere: Secondary | ICD-10-CM | POA: Diagnosis not present

## 2022-06-05 DIAGNOSIS — I480 Paroxysmal atrial fibrillation: Secondary | ICD-10-CM | POA: Diagnosis not present

## 2022-06-05 DIAGNOSIS — J189 Pneumonia, unspecified organism: Secondary | ICD-10-CM | POA: Diagnosis not present

## 2022-06-05 NOTE — Progress Notes (Signed)
Subjective:    Patient ID: Antonio [name], male    DOB: 1945-07-30, 77 y.o.   MRN: [id number] Patient Care Team: Duanne Limerick, MD as PCP - General (Family Medicine) Lanier Prude, MD as PCP - Electrophysiology (Cardiology) End, Cristal Deer, MD as PCP - Cardiology (Cardiology) Pyrtle, Carie Caddy, MD as Consulting Physician (Gastroenterology) Vanna Scotland, MD as Consulting Physician (Urology) Salena Saner, MD as Consulting Physician (Pulmonary Disease) Kemper Durie, RN as [facility] Management  Chief Complaint  Patient presents with   Follow-up    SOB with exertion. No wheezing. Cough with some sputum.   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  He was noted to have asthma with allergic phenotype and elevated IgE and peripheral eosinophilia. He was started on Dupixent on September 2020.  He has history significant for coronary artery disease and paroxysmal atrial fibrillation he is on Eliquis for anticoagulation.  He was last seen on 30 December 2021, this is a scheduled visit.  Since his last visit he was admitted to [facility] on 22 May 2022 through 28 May 2022 due to pneumonia.  He also was noted to have A-fib with RVR and had to have a switch from beta-blocker to diltiazem.  He is on Xarelto and flecainide as well.  He was treated with antibiotics for community-acquired pneumonia (ceftriaxone and azithromycin) and was subsequently discharged home.  He is not requiring oxygen.  He is still somewhat debilitated from that admission.  He has been using a walker and is participating in home physical therapy which has helped him.  He states that he gets stronger every day.  He is currently maintained on Symbicort 2 puffs twice a day and as needed albuterol. He also has nocturnal hypoxemia due to emphysema and is on nocturnal oxygen at 2 L/min and notes that  this has had a very positive impact on his health and wellbeing.  At his last visit it was noted that he was having some difficulties with urinary retention so Breztri was switched to Symbicort.  Since he had the LAMA component taken out of his respiratory medications he notes that he has not had any further issues with urinary retention.  He is on Dupixent for his asthma component.  Wife administers it to him.  DATA 06/09/2014 pulmonary function test> ratio 51%, FEV1 1.47 L (40% predicted), total lung capacity 5.43 L (71% predicted). DLCO 16.26 (44% predicted) December 2017 lung function testing showed ratio 50%, FEV1 1.47 L 43% predicted, FVC 2.96 L 67% predicted March 2020 pulmonary function testing ratio 38%, FEV1 0.97 L 31% predicted, total lung capacity 8.44 L 119% predicted, residual volume 198% predicted, DLCO 14.0 55% predicted March 2020 high-resolution CT scan of the chest images : Centrilobular emphysema, no interstitial lung disease, aortic atherosclerosis 04/20/2018-eosinophils relative 7.3, eosinophils absolute 0.6 04/20/2018-IgE-67 01/20/2019-SARS-CoV-2-detected 03/06/2021 alpha 1 antitrypsin: Phenotype MM, level 165 03/11/2021 echocardiogram: LVEF 55%, right ventricular function normal.  No wall motion abnormalities. 04/03/2021 PFT: FEV1 1.09 L or 36% predicted, FVC 2.60 L or 61% predicted, FEV1/FVC 42%, no bronchodilator response. Lung volumes showed moderate air trapping.  Diffusion capacity severely decreased at 49%. 05/22/2022 chest x-ray 1 view: Patchy airspace disease in the medial right base concerning for pneumonia.  Review of Systems A 10 point review of systems was performed and it is as noted above otherwise negative.  Patient Active Problem List  Diagnosis Date Noted   Demand ischemia 05/24/2022   Pure hypercholesterolemia 05/24/2022   Primary hypertension 05/24/2022   Atrial fibrillation with rapid ventricular response 05/22/2022   Sepsis due to pneumonia  05/22/2022   CAP (community acquired pneumonia) 05/22/2022   Nocturnal hypoxemia 05/22/2022   BPH (benign prostatic hyperplasia) 05/22/2022   History of urinary retention 05/22/2022   Coronary artery disease involving native coronary artery of native heart without angina pectoris 03/21/2022   Precordial pain 02/19/2022   Atypical chest pain 12/21/2021   DOE (dyspnea on exertion) 01/24/2021   Secondary hypercoagulable state 12/19/2020   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   PAD ss/p SFA stent(peripheral artery disease) 11/08/2019   Paroxysmal atrial fibrillation 09/02/2019   Stiffness of finger joint of left hand 04/06/2019   History of COVID-19 01/20/2019   Severe persistent asthma without complication 01/20/2019   Medication management 09/20/2018   Stage 3 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 09/20/2018   Increased eosinophils in the blood 06/21/2018   Diffusion capacity of lung (dl), decreased 16/11/9602   COPD with acute exacerbation 06/22/2017   Atrial flutter, paroxysmal 10/17/2016   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/2016   SVT (supraventricular tachycardia) 10/17/2016   Ulcerative colitis 05/28/2016   Cervical stenosis of spinal canal 05/05/2016   Carotid stenosis 05/02/2016   Atherosclerosis of native arteries of extremity with intermittent claudication 05/02/2016   Cramping of hands 01/10/2016   Bilateral carpal tunnel syndrome 12/06/2015   Centrilobular Emphysema 04/25/2014   Former smoker 04/25/2014   Tobacco abuse 04/25/2014   Social History   Tobacco Use   Smoking status: Former    Packs/day: 0.50    Years: 35.00    Additional pack years: 0.00    Total pack years: 17.50    Types: Cigarettes, Cigars    Quit date: 06/11/2011    Years since quitting: 10.9   Smokeless tobacco: Never   Tobacco comments:    Former smoker 12/19/2020  Substance Use Topics   Alcohol use: Yes    Alcohol/week: 6.0 standard drinks of alcohol    Types: 6 Shots of liquor per week    Comment: wine, liquor,  or beer   Allergies  Allergen Reactions   Penicillins Rash    Has patient had a PCN reaction causing immediate rash, facial/tongue/throat swelling, SOB or lightheadedness with hypotension:Yes Has patient had a PCN reaction causing severe rash involving mucus membranes or skin necrosis:Yes Has patient had a PCN reaction that required hospitalization:No Has patient had a PCN reaction occurring within the last 10 years:No If all of the above answers are "NO", then may proceed with Cephalosporin use.    Current Meds  Medication Sig   albuterol (PROVENTIL) (2.5 MG/3ML) 0.083% nebulizer solution Take 3 mLs (2.5 mg total) by nebulization every 6 (six) hours as needed for wheezing or shortness of breath.   albuterol (VENTOLIN HFA) 108 (90 Base) MCG/ACT inhaler Inhale 2 puffs into the lungs every 6 (six) hours as needed for wheezing or shortness of breath.   Azelastine HCl 137 MCG/SPRAY SOLN PLACE 1 SPRAY INTO BOTH NOSTRILS TWICE DAILY (Patient taking differently: Place 1 spray into both nostrils 2 (two) times daily.)   diltiazem (CARDIZEM CD) 120 MG 24 hr capsule Take 1 capsule (120 mg total) by mouth daily.   dupilumab (DUPIXENT) 300 MG/2ML prefilled syringe Inject 300 mg into the skin every 14 (fourteen) days.   famotidine (PEPCID) 20 MG tablet Take 1 tablet (20 mg total) by mouth 2 (two) times daily as needed for heartburn or  indigestion.   flecainide (TAMBOCOR) 50 MG tablet Take 1.5 tablets (75 mg total) by mouth 2 (two) times daily.   fluticasone-salmeterol (WIXELA INHUB) 250-50 MCG/ACT AEPB Inhale 1 puff into the lungs in the morning and at bedtime.   furosemide (LASIX) 20 MG tablet TAKE 1 TABLET BY MOUTH DAILY AS NEEDED (FOR SWELLING OR WEIGHT GAIN OF 3 LBS OVERNIGHT OR 5 LBS IN A WEEK). (Patient taking differently: Take 20 mg by mouth daily as needed for fluid or edema.)   ipratropium (ATROVENT) 0.02 % nebulizer solution Take 2.5 mLs (0.5 mg total) by nebulization 4 (four) times daily.    mesalamine (LIALDA) 1.2 g EC tablet TAKE 4 TABLETS BY MOUTH DAILY WITH BREAKFAST (Patient taking differently: Take 4.8 g by mouth daily with breakfast. TAKE 4 TABLETS BY MOUTH DAILY WITH BREAKFAST)   mupirocin ointment (BACTROBAN) 2 % Apply 1 Application topically 2 (two) times daily.   rivaroxaban (XARELTO) 20 MG TABS tablet Take 1 tablet (20 mg total) by mouth daily with supper.   rosuvastatin (CRESTOR) 10 MG tablet Take 1 tablet (10 mg total) by mouth daily.   tamsulosin (FLOMAX) 0.4 MG CAPS capsule Take 1 capsule (0.4 mg total) by mouth daily.   Immunization History  Administered Date(s) Administered   Fluad Quad(high Dose 65+) 11/01/2020, 10/29/2021   Influenza Inj Mdck Quad Pf 10/28/2017   Influenza Split 11/24/2013   Influenza, High Dose Seasonal PF 11/05/2016, 10/29/2017, 10/06/2018, 11/03/2019   Influenza,inj,Quad PF,6+ Mos 11/08/2014, 11/26/2015   Influenza-Unspecified 10/11/2013, 10/11/2013   PFIZER(Purple Top)SARS-COV-2 Vaccination 05/06/2019, 05/31/2019, 01/27/2020   PNEUMOCOCCAL CONJUGATE-20 06/03/2022   Pneumococcal Conjugate-13 04/18/2014   Pneumococcal Polysaccharide-23 11/26/2015   Tdap 02/13/2011   Zoster Recombinat (Shingrix) 12/01/2016, 03/02/2017   Zoster, Live 11/07/2010       Objective:   Physical Exam BP 124/70 (BP Location: Left Arm, Cuff Size: Normal)   Pulse 67   Temp (!) 97.5 F (36.4 C)   Ht 5\' 10"  (1.778 m)   Wt 136 lb 9.6 oz (62 kg)   SpO2 95%   BMI 19.60 kg/m   SpO2: 95 % O2 Device: None (Room air)  GENERAL: Thin well-developed gentleman, no acute distress.  Well-groomed, presents ambulating with assistance of a walker.  No conversational dyspnea. HEAD: Normocephalic, atraumatic.  EYES: Pupils equal, round, reactive to light.  No scleral icterus.  MOUTH: Dentition intact.  Oral mucosa moist.  No thrush. NECK: Supple. No thyromegaly. Trachea midline. No JVD.  No adenopathy. PULMONARY: Good air entry bilaterally.  No adventitious  sounds. CARDIOVASCULAR: S1 and S2.  Regular rate and rhythm.  No rubs, murmurs or gallops heard. ABDOMEN: Benign. MUSCULOSKELETAL: No joint deformity, no clubbing, no edema.  Decreased muscle mass particularly at lower extremities proximally. NEUROLOGIC: No overt focal deficit, no gait disturbance, speech is fluent. SKIN: Intact,warm,dry. PSYCH: Mood and behavior normal.      Assessment & Plan:     ICD-10-CM   1. Stage 3 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  J44.9    Continue LABA/ICS Avoiding LAMA due to urinary retention Continue as needed albuterol    2. Severe persistent asthma without complication  J45.50    Continue LABA/ICS Continue Dupixent Patient aware of asthma action plan    3. Nocturnal hypoxemia due to emphysema  J43.9    G47.36    Patient compliant with nocturnal oxygen Notes benefit of therapy Continue oxygen at 2 L/min nocturnally    4. Paroxysmal atrial fibrillation  I48.0    Currently in sinus rhythm  Continue Xarelto, calcium channel blocker and flecainide Continue follow-up with cardiology    5. Community acquired pneumonia of right lower lobe of lung  J18.9    Clinically resolved    6. History of urinary retention  Z87.898    Avoiding LAMA Continue Flomax     Patient appears to have resolved his community-acquired pneumonia issue.  We will see him in follow-up in 3 to 4 weeks time with either me or the nurse practitioner at that time to ensure he is still doing well.  His insurance company is no longer covering Symbicort so he was provided [employer] during his hospital admission and he will start Wixela 250/51 inhalation twice a day.  He will let us know if he is not tolerating this medication.  Gailen Shelter, MD Advanced Bronchoscopy PCCM  [facility]    *This note was dictated using voice recognition software/Dragon.  Despite best efforts to proofread, errors can occur which can change the meaning. Any transcriptional errors that  result from this process are unintentional and may not be fully corrected at the time of dictation.

## 2022-06-05 NOTE — Telephone Encounter (Signed)
Per Dr. Jayme Cloud verbally- patient should continue Albuterol sulfate PRN and start Wixela after he feels symbicort. He should d/c pulmicort.  Patient is aware and voiced his understanding.  Nothing further needed.

## 2022-06-05 NOTE — Patient Instructions (Signed)
After you finish the Symbicort you can start on the [facility] which is 1 puff twice a day.  Make sure you rinse your mouth well after you use it.  Please let us know how you do with that inhaler.  We will see you in follow-up in 3 to 4 weeks time with either me or the nurse practitioner at that time.  Continue your physical therapy as you are doing.

## 2022-06-05 NOTE — Telephone Encounter (Signed)
Pt called the office stating after his appt today with Dr. Reece Agar, when he got home  he had a message on his machine stating for him to return a call to [place]. Routing to [employer] triage.

## 2022-06-09 ENCOUNTER — Ambulatory Visit: Payer: PPO | Admitting: Cardiology

## 2022-06-10 ENCOUNTER — Ambulatory Visit: Payer: PPO | Attending: Cardiology | Admitting: Cardiology

## 2022-06-10 ENCOUNTER — Encounter: Payer: Self-pay | Admitting: Cardiology

## 2022-06-10 VITALS — BP 122/60 | HR 62 | Ht 70.0 in | Wt 139.2 lb

## 2022-06-10 DIAGNOSIS — J449 Chronic obstructive pulmonary disease, unspecified: Secondary | ICD-10-CM | POA: Diagnosis not present

## 2022-06-10 DIAGNOSIS — E782 Mixed hyperlipidemia: Secondary | ICD-10-CM

## 2022-06-10 DIAGNOSIS — I2510 Atherosclerotic heart disease of native coronary artery without angina pectoris: Secondary | ICD-10-CM

## 2022-06-10 DIAGNOSIS — I480 Paroxysmal atrial fibrillation: Secondary | ICD-10-CM

## 2022-06-10 DIAGNOSIS — J189 Pneumonia, unspecified organism: Secondary | ICD-10-CM

## 2022-06-10 MED ORDER — NITROGLYCERIN 0.4 MG SL SUBL: 0.4000 mg | SUBLINGUAL_TABLET | SUBLINGUAL | 3 refills | Status: DC | PRN

## 2022-06-10 NOTE — Progress Notes (Signed)
Cardiology Office Note:   Date:  06/10/2022  ID:  Tel, Hevia December 06, 1945, MRN [id number]  History of Present Illness:   Antonio [name] is a 77 y.o. male with a past medical history of paroxysmal atrial flutter/fibrillation on flecainide and Xarelto, nonobstructive coronary artery disease, severe COPD, PSVT, PAD status post left SFA stenting, hypertension, hyperlipidemia, ulcerative colitis, BPH, who returns to clinic today after being seen in the hospital for evaluation of rapid atrial fibrillation in the setting of pneumonia and sepsis.  Echocardiogram in January 2023 showed normal LV function, he underwent stress testing in May 2023 in the setting of chest tightness which showed normal LV function without ischemia or infarct.  Coronary calcium was noted.  January 2024 in the setting of recurrent chest discomfort decision was made to pursue coronary CT angiogram which showed mild, nonobstructive CAD.  First paroxysmal atrial fibrillation atrial flutter he continues to be followed by EP Dr. Lalla Brothers.  It was felt he was not a candidate for advanced procedure secondary to severe lung disease.  He is continued to be managed with flecainide and.  Earlier in the year his metoprolol dose was reduced secondary to baseline bradycardia was currently on the limited dosing.  Beginning on April 8 he started experience mild dyspnea malaise and pleuritic back pain this progressed throughout the week and became associated with chest tightness and worsening dyspnea.  He did not presented to the [facility] emergency department on April 11 where he was found to be in atrial fibrillation with RVR.  Rates in the 170s and 180s.  He did have a temperature of 102.5.  White count was 13,000 and lactic acid was elevated 2.7.  High-sensitivity troponin was mildly elevated with peak of 34.  Chest x-ray showed patchy airspace disease in the medial right base concerning for pneumonia.  He was placed on IV diltiazem as  well as antibiotics for management of pneumonia and sepsis and admitted for continued evaluation.  He subsequently returned to sinus rhythm and was maintained on IV diltiazem.  Home doses of flecainide and metoprolol were continued.  He was continued on rate control with diltiazem and flecainide with metoprolol being discontinued, continued on antibiotic therapy, and was subsequently able to be discharged on 05/28/2022.  He returns to clinic today accompanied by his wife stating overall he has been doing well.  He states that his shortness of breath is improved, no further episodes of chest discomfort or palpitations.  He has followed up with pulmonary and has had his inhaler changed as well.  He has been working with physical therapy and he is able to ambulate with a walker.  Was changed from metoprolol to diltiazem during recent hospitalization denies peripheral edema.  ROS: 10 point review of systems is considered negative with exception of what is listed in the HPI  Studies Reviewed:    EKG: Sinus rhythm with sinus arrhythmia PACs with a rate of 62, no acute changes noted  Coronary CTA 03/06/22 IMPRESSION: 1. Coronary calcium score of 251. This was 42nd percentile for age and sex matched control.   2. Normal coronary origin with left dominance.   3. Mild distal LM and proximal LAD stenosis (25-49%)   4. CAD-RADS 2. Mild non-obstructive CAD (25-49%). Consider non-atherosclerotic causes of chest pain. Consider preventive therapy and risk factor modification.  Risk Assessment/Calculations:    CHA2DS2-VASc Score = 4   This indicates a 4.8% annual risk of stroke. The patient's score is based upon: CHF History: 0  HTN History: 1 Diabetes History: 0 Stroke History: 0 Vascular Disease History: 1 Age Score: 2 Gender Score: 0             Physical Exam:   VS:  BP 122/60 (BP Location: Left Arm, Patient Position: Sitting, Cuff Size: Normal)   Pulse 62   Ht 5\' 10"  (1.778 m)   Wt 139 lb  3.2 oz (63.1 kg)   BMI 19.97 kg/m    Wt Readings from Last 3 Encounters:  06/10/22 139 lb 3.2 oz (63.1 kg)  06/05/22 136 lb 9.6 oz (62 kg)  06/03/22 135 lb (61.2 kg)     GEN: Well nourished, well developed in no acute distress NECK: No JVD; No carotid bruits CARDIAC: RRR, no murmurs, rubs, gallops RESPIRATORY:  Clear to auscultation without rales, wheezing or rhonchi  ABDOMEN: Soft, non-tender, non-distended EXTREMITIES:  No edema; No deformity   ASSESSMENT AND PLAN:   Paroxysmal atrial fibrillation atrial flutter that is sinus rhythm on EKG today.  He has been continued on oral anticoagulant with rivaroxaban 20 mg daily for CHA2DS2-VASc score of at least 4.  He was previously hospitalized with atrial fibrillation RVR with rates of 1 60-1 70s in the setting of sepsis and community-acquired pneumonia.  During hospitalization metoprolol was discontinued and he was started on diltiazem 120 mg daily.  Previously he was on metoprolol and flecainide.  He is continued on flecainide 75 mg twice daily as well.  He is also been encouraged to continue all follow-ups with EP.  Nonobstructive coronary artery disease with previous mild troponin elevation with peak of 34 during recent hospitalization and is likely demand ischemia.  He remains chest pain-free today.  Coronary CT angiogram in January 2024 showed mild, nonobstructive coronary artery disease.  He is continued on rivaroxaban and lieu of aspirin and rosuvastatin 10 mg daily.  Also requesting a refill of his as needed Nitrostat be sent to the pharmacy of choice today.  Essential hypertension with blood pressure today 122/60.  Blood pressure remained stable.  He is continued to, flecainide, furosemide as needed.  Encouraged to continue to monitor blood pressures at home as well.  Hyperlipidemia with last LDL 50 in June 2024.  He is continued on rosuvastatin 10 mg daily.  His LDL goal is less than 70.  Community-acquired pneumonia which he was  found to be septic and required hospitalization with hypoxia during his hospitalization he was on oxygen via nasal cannula continue 3 L.  He is on room air today.  He has recently followed up with pulmonary and was changing his inhalers as well.  He was sent home with antibiotic therapy and continues to do well from resolving pneumonia.  Severe COPD with continues to follow with Dr. Jayme Cloud as an outpatient.  He is continued on nocturnal oxygen 2 L/min daily due to nocturnal hypoxia due to his emphysema.  Disposition patient return to clinic to see MD/APP in 3 months or sooner if needed.        Signed, Kalika Smay, NP

## 2022-06-10 NOTE — Patient Instructions (Signed)
Medication Instructions:  - Your physician recommends that you continue on your current medications as directed. Please refer to the Current Medication list given to you today.   *If you need a refill on your cardiac medications before your next appointment, please call your pharmacy*   Lab Work: - none ordered  If you have labs (blood work) drawn today and your tests are completely normal, you will receive your results only by: MyChart Message (if you have MyChart) OR A paper copy in the mail If you have any lab test that is abnormal or we need to change your treatment, we will call you to review the results.   Testing/Procedures: - none ordered   Follow-Up: At [facility], you and your health needs are our priority.  As part of our continuing mission to provide you with exceptional heart care, we have created designated Provider Care Teams.  These Care Teams include your primary Cardiologist (physician) and Advanced Practice Providers (APPs -  Physician Assistants and Nurse Practitioners) who all work together to provide you with the care you need, when you need it.  We recommend signing up for the patient portal called "MyChart".  Sign up information is provided on this After Visit Summary.  MyChart is used to connect with patients for Virtual Visits (Telemedicine).  Patients are able to view lab/test results, encounter notes, upcoming appointments, etc.  Non-urgent messages can be sent to your provider as well.   To learn more about what you can do with MyChart, go to [email or web address].    Your next appointment:   3 month(s)  Provider:   You may see Yvonne Kendall, MD or one of the following Advanced Practice Providers on your designated Care Team:    Charlsie Quest, NP    Other Instructions N/a

## 2022-06-11 DIAGNOSIS — E7800 Pure hypercholesterolemia, unspecified: Secondary | ICD-10-CM | POA: Diagnosis not present

## 2022-06-11 DIAGNOSIS — I4891 Unspecified atrial fibrillation: Secondary | ICD-10-CM | POA: Diagnosis not present

## 2022-06-11 DIAGNOSIS — N401 Enlarged prostate with lower urinary tract symptoms: Secondary | ICD-10-CM | POA: Diagnosis not present

## 2022-06-11 DIAGNOSIS — G4733 Obstructive sleep apnea (adult) (pediatric): Secondary | ICD-10-CM | POA: Diagnosis not present

## 2022-06-11 DIAGNOSIS — I119 Hypertensive heart disease without heart failure: Secondary | ICD-10-CM | POA: Diagnosis not present

## 2022-06-11 DIAGNOSIS — I739 Peripheral vascular disease, unspecified: Secondary | ICD-10-CM | POA: Diagnosis not present

## 2022-06-11 DIAGNOSIS — J189 Pneumonia, unspecified organism: Secondary | ICD-10-CM | POA: Diagnosis not present

## 2022-06-11 DIAGNOSIS — I2489 Other forms of acute ischemic heart disease: Secondary | ICD-10-CM | POA: Diagnosis not present

## 2022-06-11 DIAGNOSIS — J449 Chronic obstructive pulmonary disease, unspecified: Secondary | ICD-10-CM | POA: Diagnosis not present

## 2022-06-11 DIAGNOSIS — Z9981 Dependence on supplemental oxygen: Secondary | ICD-10-CM | POA: Diagnosis not present

## 2022-06-18 ENCOUNTER — Telehealth: Payer: Self-pay | Admitting: Internal Medicine

## 2022-06-18 MED ORDER — METOPROLOL TARTRATE 25 MG PO TABS: 25.0000 mg | ORAL_TABLET | Freq: Two times a day (BID) | ORAL | 3 refills | Status: DC | PRN

## 2022-06-18 NOTE — Telephone Encounter (Signed)
Mary from [facility] PT called to make Dr. Okey Dupre aware pt experienced an afib episode on Monday. She stated pt reported symptoms lasted roughly 2 hours with a HR ranging from 125-130. She stated pt reported he took Metoprolol 25 mg even though it was discontinued. Then an hour later took a second dose. She stated pt reported he hasn't had an issue since.  Corrie Dandy also stated pt's wife reported pt drank a shot of scotch the day of prior to symptoms.   Will forward to MD to make aware

## 2022-06-18 NOTE — Telephone Encounter (Signed)
Pt made aware of Dr. Serita Kyle recommendations and verbalized understanding.

## 2022-06-18 NOTE — Telephone Encounter (Signed)
Patient had an afib episode on 5/6. Patient took metoprolol but its currently disconnected. Said that Dr. Okey Dupre told him he can take it if he started having afib.

## 2022-06-18 NOTE — Telephone Encounter (Signed)
It is fine to send in a prescription for metoprolol tartrate 25 mg BID prn palpitations/tachycardia (he should not use it on a standing basis, prn only).  He should try to avoid drinking alcohol, as it can precipitate a-fib.  Yvonne Kendall, MD [facility]

## 2022-07-04 ENCOUNTER — Ambulatory Visit
Admission: RE | Admit: 2022-07-04 | Discharge: 2022-07-04 | Disposition: A | Discharge status: Home / Self Care | Payer: PPO | Source: Ambulatory Visit | Attending: Nurse Practitioner | Admitting: Nurse Practitioner

## 2022-07-04 ENCOUNTER — Encounter: Payer: Self-pay | Admitting: Nurse Practitioner

## 2022-07-04 ENCOUNTER — Ambulatory Visit (INDEPENDENT_AMBULATORY_CARE_PROVIDER_SITE_OTHER): Payer: PPO | Admitting: Nurse Practitioner

## 2022-07-04 VITALS — BP 112/62 | HR 62 | Temp 97.6°F | Ht 70.0 in | Wt 139.4 lb

## 2022-07-04 DIAGNOSIS — G4734 Idiopathic sleep related nonobstructive alveolar hypoventilation: Secondary | ICD-10-CM

## 2022-07-04 DIAGNOSIS — J189 Pneumonia, unspecified organism: Secondary | ICD-10-CM

## 2022-07-04 DIAGNOSIS — I480 Paroxysmal atrial fibrillation: Secondary | ICD-10-CM

## 2022-07-04 DIAGNOSIS — J449 Chronic obstructive pulmonary disease, unspecified: Secondary | ICD-10-CM | POA: Diagnosis not present

## 2022-07-04 DIAGNOSIS — J439 Emphysema, unspecified: Secondary | ICD-10-CM | POA: Diagnosis not present

## 2022-07-04 DIAGNOSIS — J4550 Severe persistent asthma, uncomplicated: Secondary | ICD-10-CM | POA: Diagnosis not present

## 2022-07-04 MED ORDER — TRELEGY ELLIPTA 100-62.5-25 MCG/ACT IN AEPB: 1.0000 | INHALATION_SPRAY | Freq: Every day | RESPIRATORY_TRACT | 0 refills | Status: DC

## 2022-07-04 NOTE — Patient Instructions (Addendum)
-  Continue Albuterol inhaler 2 puffs or 3 mL neb every 6 hours as needed for shortness of breath or wheezing. Notify if symptoms persist despite rescue inhaler/neb use.  -Continue Trelegy 1 puff daily. Brush tongue and rinse mouth afterwards. Monitor for any urinary symptoms on this. If you don't have any difficulties, let us know so we can send a new prescription in -Do not use your Wixela if you are using the Trelegy -Stop the ipratropium nebulizer solution and only use the albuterol  -Continue azelastine 1 puff Twice daily for nasal congestion  -Continue dupixent injection every 2 weeks  -Continue guaifenesin (mucinex or other similar brand) over the counter as needed for cough/chest congestion -continue supplemental oxygen 2 lpm at night   Chest x ray today   Follow up in 8 weeks with Dr. Jayme Cloud or Katie Santosha Jividen,NP. If symptoms do not improve or worsen, please contact office for sooner follow up or seek emergency care.

## 2022-07-04 NOTE — Assessment & Plan Note (Signed)
Repeat CXR today to ensure resolution. Clinically improved.

## 2022-07-04 NOTE — Progress Notes (Signed)
Agree with the details of the visit as noted by Katherine Cobb, NP. ° °C. Laura Lamira Borin, MD °Advanced Bronchoscopy °PCCM [facility] ° °

## 2022-07-04 NOTE — Assessment & Plan Note (Signed)
Continue supplemental oxygen at night. Continue to monitor during the day for goal>88-90%

## 2022-07-04 NOTE — Progress Notes (Signed)
@Patient  ID: Antonio [name], male    DOB: August 31, 1945, 77 y.o.   MRN: [id number]  Chief Complaint  Patient presents with   Follow-up    SOB with exertion and prod cough(unsure of color),     Referring provider: Duanne Limerick, MD  HPI: 76 year old male, former smoker followed for severe COPD with asthma overlap and nocturnal hypoxia.  He is a patient of Dr. Jayme Cloud and last seen in office 12/05/2022.  Past medical history significant for carotid artery stenosis, atrial flutter/PAF on Xarelto, PAD, CAD, hypertension, UC, BPH.  TEST/EVENTS:  March 2020 HRCT chest: Centrilobular emphysema, no ILD.  Atherosclerosis. 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 04/03/2021 PFT: FVC 61%, FEV1 36%, ratio 42%.  No BD.  Moderate air trapping.  Diffusion capacity severely decreased at 49%. 05/22/2022 CXR: Patchy airspace disease in the medial right base concerning for pneumonia.  06/05/2022: OV with Dr. Jayme Cloud.  Since he was last seen, he was admitted to [facility] on 05/22/2022 through 05/28/2022 for pneumonia.  He also had A-fib with RVR.  He was treated with empiric ceftriaxone and azithromycin.  He was changed from beta-blocker to diltiazem.  Not requiring oxygen upon discharge.  Somewhat debilitated from admission.  Using a walker and participating in home physical therapy which has helped him.  States that he is getting stronger every day.  Currently maintained on Symbicort 2 puffs twice a day and as needed albuterol.  He had had some difficulties with urinary retention on Breztri, which was why LAMA component was discontinued.  On Dupixent for his asthma.  Unfortunately, insurance is no longer covering Symbicort.  Will change Wixela.  07/04/2022: Today-follow-up Patient resents today for follow-up with his wife.  He has been doing better since he was here last.  Feels like he has  regained quite a bit of stamina back.  He was able to walk in here from the parking lot without too much trouble.  Did have some shortness of breath but felt similar to his baseline.  He does have some occasional chest congestion and cough.  He is not sure what color his phlegm is as he tends to just swallow it.  Denies any wheezing, fevers, chills, hemoptysis, leg swelling, orthopnea.  Since he was here last, he switched to a Trelegy sample that he had at home from his [place].  He actually feels like this is working much better for him than anything else has.  He has not required any rescue inhaler or neb while on this.  Not having any urinary difficulties thus far.  He is taking his Astelin nasal spray.  Has Dupixent injections every 2 weeks.  Is using an over-the-counter cough and congestion medicine from time to time.  Sleeps with his oxygen at night.  Allergies  Allergen Reactions   Penicillins Rash    Has patient had a PCN reaction causing immediate rash, facial/tongue/throat swelling, SOB or lightheadedness with hypotension:Yes Has patient had a PCN reaction causing severe rash involving mucus membranes or skin necrosis:Yes Has patient had a PCN reaction that required hospitalization:No Has patient had a PCN reaction occurring within the last 10 years:No If all of the above answers are "NO", then may proceed with Cephalosporin use.     Immunization History  Administered Date(s) Administered   Fluad Quad(high Dose 65+) 11/01/2020, 10/29/2021   Influenza Inj Mdck Quad Pf 10/28/2017   Influenza Split  11/24/2013   Influenza, High Dose Seasonal PF 11/05/2016, 10/29/2017, 10/06/2018, 11/03/2019   Influenza,inj,Quad PF,6+ Mos 11/08/2014, 11/26/2015   Influenza-Unspecified 10/11/2013, 10/11/2013   PFIZER(Purple Top)SARS-COV-2 Vaccination 05/06/2019, 05/31/2019, 01/27/2020   PNEUMOCOCCAL CONJUGATE-20 06/03/2022   Pneumococcal Conjugate-13 04/18/2014   Pneumococcal Polysaccharide-23 11/26/2015    Tdap 02/13/2011   Zoster Recombinat (Shingrix) 12/01/2016, 03/02/2017   Zoster, Live 11/07/2010    Past Medical History:  Diagnosis Date   Allergy    Calf pain    COPD (chronic obstructive pulmonary disease) (HCC)    no Oxygen per pt   COVID-19    Dyspnea    Essential hypertension    History of echocardiogram    a. 02/2021 Echo: EF 55%, no rwma, nl RV fxn.   Hypokalemia    Internal hemorrhoids    Mixed hyperlipidemia    Muscle cramp    Muscle pain    Nonobstructive CAD (coronary artery disease)    a. 06/2021 MV: EF 65%, no isch/infarct,Cor Ca2+; b. 02/2022 Cor CTA: LM 25, LAD 25-49, LCX nl, OM1/2 nl, RCA nondom, nl. Ca2+ = 252 (42nd%'ile).   PAD (peripheral artery disease) (HCC)    a. s/p L SFA stenting.   Paroxysmal atrial fibrillation (HCC) 08/2019   a. CHA2DS2VASc = 4-->xarelto/flecainide.   Pneumonia    PONV (postoperative nausea and vomiting)    Tubular adenoma of colon    Ulcerative colitis (HCC)     Tobacco History: Social History   Tobacco Use  Smoking Status Former   Packs/day: 0.50   Years: 35.00   Additional pack years: 0.00   Total pack years: 17.50   Types: Cigarettes, Cigars   Quit date: 06/11/2011   Years since quitting: 11.0  Smokeless Tobacco Never  Tobacco Comments   Former smoker 12/19/2020   Counseling given: Not Answered Tobacco comments: Former smoker 12/19/2020   Outpatient Medications Prior to Visit  Medication Sig Dispense Refill   albuterol (PROVENTIL) (2.5 MG/3ML) 0.083% nebulizer solution Take 3 mLs (2.5 mg total) by nebulization every 6 (six) hours as needed for wheezing or shortness of breath. 225 mL 12   albuterol (VENTOLIN HFA) 108 (90 Base) MCG/ACT inhaler Inhale 2 puffs into the lungs every 6 (six) hours as needed for wheezing or shortness of breath. 8 g 2   Azelastine HCl 137 MCG/SPRAY SOLN PLACE 1 SPRAY INTO BOTH NOSTRILS TWICE DAILY (Patient taking differently: Place 1 spray into both nostrils 2 (two) times daily.) 30 mL 10    diltiazem (CARDIZEM CD) 120 MG 24 hr capsule Take 1 capsule (120 mg total) by mouth daily. 30 capsule 1   dupilumab (DUPIXENT) 300 MG/2ML prefilled syringe Inject 300 mg into the skin every 14 (fourteen) days. 12 mL 1   famotidine (PEPCID) 20 MG tablet Take 1 tablet (20 mg total) by mouth 2 (two) times daily as needed for heartburn or indigestion.     flecainide (TAMBOCOR) 50 MG tablet Take 1.5 tablets (75 mg total) by mouth 2 (two) times daily. 270 tablet 3   furosemide (LASIX) 20 MG tablet TAKE 1 TABLET BY MOUTH DAILY AS NEEDED (FOR SWELLING OR WEIGHT GAIN OF 3 LBS OVERNIGHT OR 5 LBS IN A WEEK). (Patient taking differently: Take 20 mg by mouth daily as needed for fluid or edema.) 90 tablet 1   ipratropium (ATROVENT) 0.02 % nebulizer solution Take 2.5 mLs (0.5 mg total) by nebulization 4 (four) times daily. 75 mL 12   mesalamine (LIALDA) 1.2 g EC tablet TAKE 4 TABLETS BY MOUTH DAILY  WITH BREAKFAST (Patient taking differently: Take 4.8 g by mouth daily with breakfast. TAKE 4 TABLETS BY MOUTH DAILY WITH BREAKFAST) 120 tablet 0   metoprolol tartrate (LOPRESSOR) 25 MG tablet Take 1 tablet (25 mg total) by mouth 2 (two) times daily as needed. 180 tablet 3   mupirocin ointment (BACTROBAN) 2 % Apply 1 Application topically 2 (two) times daily. 22 g 0   nitroGLYCERIN (NITROSTAT) 0.4 MG SL tablet Place 1 tablet (0.4 mg total) under the tongue every 5 (five) minutes as needed for chest pain. 25 tablet 3   rivaroxaban (XARELTO) 20 MG TABS tablet Take 1 tablet (20 mg total) by mouth daily with supper. 90 tablet 3   rosuvastatin (CRESTOR) 10 MG tablet Take 1 tablet (10 mg total) by mouth daily. 90 tablet 3   tamsulosin (FLOMAX) 0.4 MG CAPS capsule Take 1 capsule (0.4 mg total) by mouth daily. 90 capsule 3   fluticasone-salmeterol (WIXELA INHUB) 250-50 MCG/ACT AEPB Inhale 1 puff into the lungs in the morning and at bedtime. 60 each 11   Facility-Administered Medications Prior to Visit  Medication Dose Route  Frequency Provider Last Rate Last Admin   albuterol (PROVENTIL) (2.5 MG/3ML) 0.083% nebulizer solution 2.5 mg  2.5 mg Nebulization Once Salena Saner, MD         Review of Systems:   Constitutional: No weight loss or gain, night sweats, fevers, chills, fatigue. + lassitude (improved). HEENT: No headaches, difficulty swallowing, tooth/dental problems, or sore throat. No sneezing, itching, ear ache, nasal congestion, or post nasal drip CV:  No chest pain, orthopnea, PND, swelling in lower extremities, anasarca, dizziness, palpitations, syncope Resp: +shortness of breath with exertion; occasional chest congestion; daily productive cough. No hemoptysis. No wheezing.  No chest wall deformity GI:  No heartburn, indigestion, abdominal pain, nausea, vomiting, diarrhea, change in bowel habits, loss of appetite, bloody stools.  GU: No dysuria, change in color of urine, urgency or frequency.   Skin: No rash, lesions, ulcerations MSK:  No joint pain or swelling.   Neuro: No dizziness or lightheadedness.  Psych: No depression or anxiety. Mood stable.     Physical Exam:  BP 112/62 (BP Location: Left Arm, Cuff Size: Normal)   Pulse 62   Temp 97.6 F (36.4 C) (Temporal)   Ht 5\' 10"  (1.778 m)   Wt 139 lb 6.4 oz (63.2 kg)   SpO2 93%   BMI 20.00 kg/m   GEN: Pleasant, interactive, well-kempt; in no acute distress. HEENT:  Normocephalic and atraumatic. PERRLA. Sclera white. Nasal turbinates pink, moist and patent bilaterally. No rhinorrhea present. Oropharynx pink and moist, without exudate or edema. No lesions, ulcerations, or postnasal drip.  NECK:  Supple w/ fair ROM. No JVD present. Normal carotid impulses w/o bruits. Thyroid symmetrical with no goiter or nodules palpated. No lymphadenopathy.   CV: RRR, no m/r/g, no peripheral edema. Pulses intact, +2 bilaterally. No cyanosis, pallor or clubbing. PULMONARY:  Unlabored, regular breathing. Diminished bases bilaterally otherwise clear A&P w/o  wheezes/rales/rhonchi. No accessory muscle use.  GI: BS present and normoactive. Soft, non-tender to palpation. No organomegaly or masses detected. MSK: No erythema, warmth or tenderness. Cap refil <2 sec all extrem. No deformities or joint swelling noted. Muscle wasting Neuro: A/Ox3. No focal deficits noted.   Skin: Warm, no lesions or rashe Psych: Normal affect and behavior. Judgement and thought content appropriate.     Lab Results:  CBC    Component Value Date/Time   WBC 10.1 05/27/2022 0412   RBC  3.83 (L) 05/27/2022 0412   HGB 11.7 (L) 05/27/2022 0412   HGB 15.2 11/26/2015 0933   HCT 35.6 (L) 05/27/2022 0412   HCT 43.9 11/26/2015 0933   PLT 223 05/27/2022 0412   PLT 247 11/26/2015 0933   MCV 93.0 05/27/2022 0412   MCV 90 11/26/2015 0933   MCV 95 10/01/2013 2131   MCH 30.5 05/27/2022 0412   MCHC 32.9 05/27/2022 0412   RDW 11.5 05/27/2022 0412   RDW 13.6 11/26/2015 0933   RDW 12.4 10/01/2013 2131   LYMPHSABS 1.6 03/06/2021 1229   MONOABS 1.2 (H) 03/06/2021 1229   EOSABS 0.6 (H) 03/06/2021 1229   BASOSABS 0.1 03/06/2021 1229    BMET    Component Value Date/Time   NA 138 05/28/2022 1117   NA 143 12/14/2019 0931   NA 138 10/01/2013 2131   K 4.0 05/28/2022 1117   K 4.2 10/01/2013 2131   CL 102 05/28/2022 1117   CL 107 10/01/2013 2131   CO2 27 05/28/2022 1117   CO2 23 10/01/2013 2131   GLUCOSE 107 (H) 05/28/2022 1117   GLUCOSE 118 (H) 10/01/2013 2131   BUN 9 05/28/2022 1117   BUN 9 12/14/2019 0931   BUN 11 10/01/2013 2131   CREATININE 0.73 05/28/2022 1117   CREATININE 0.93 10/01/2013 2131   CALCIUM 8.7 (L) 05/28/2022 1117   CALCIUM 8.7 10/01/2013 2131   GFRNONAA >60 05/28/2022 1117   GFRNONAA >60 10/01/2013 2131   GFRAA 91 12/14/2019 0931   GFRAA >60 10/01/2013 2131    BNP    Component Value Date/Time   BNP 61.9 12/16/2021 1334     Imaging:  No results found.       Latest Ref Rng & Units 04/20/2018   11:36 AM 06/07/2014    2:58 PM  PFT  Results  FVC-Pre L 2.56  P 2.80   FVC-Predicted Pre % 60  P 57   FVC-Post L 2.60  P 2.90   FVC-Predicted Post % 61  P 59   Pre FEV1/FVC % % 38  P 52   Post FEV1/FCV % % 39  P 51   FEV1-Pre L 0.97  P 1.46   FEV1-Predicted Pre % 31  P 40   FEV1-Post L 1.02  P 1.47   DLCO uncorrected ml/min/mmHg 14.01  P 16.26   DLCO UNC% % 55  P 44   DLVA Predicted % 68  P 55   TLC L 8.44  P   TLC % Predicted % 119  P   RV % Predicted % 198  P     P Preliminary result    No results found for: "NITRICOXIDE"      Assessment & Plan:   Stage 3 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 (HCC) Severe COPD with asthma overlap. He is back on triple therapy regimen with Trelegy; seems to be tolerating better than Breztri from a urinary standpoint. We will provide him with an additional sample and he will monitor over the next few weeks. As long as he continues to tolerate this, we can send new rx. Medication education/side effect profile reviewed. He does have some daily chest congestion - encouraged mucociliary clearance regimen. We will also obtain CXR to ensure previous pna has resolved. Does not appear to be in acute exacerbation. Encouraged to remain active. Action plan in place.   Patient Instructions  -Continue Albuterol inhaler 2 puffs or 3 mL neb every 6 hours as needed for shortness of breath or wheezing. Notify  if symptoms persist despite rescue inhaler/neb use.  -Continue Trelegy 1 puff daily. Brush tongue and rinse mouth afterwards. Monitor for any urinary symptoms on this. If you don't have any difficulties, let us know so we can send a new prescription in -Do not use your Wixela if you are using the Trelegy -Stop the ipratropium nebulizer solution and only use the albuterol  -Continue azelastine 1 puff Twice daily for nasal congestion  -Continue dupixent injection every 2 weeks  -Continue guaifenesin (mucinex or other similar brand) over the counter as needed for cough/chest congestion -continue  supplemental oxygen 2 lpm at night   Chest x ray today   Follow up in 8 weeks with Dr. Jayme Cloud or Katie Dashawna Delbridge,NP. If symptoms do not improve or worsen, please contact office for sooner follow up or seek emergency care.     Severe persistent asthma without complication See above. Maintained on triple therapy inhaler and biologic therapy.   CAP (community acquired pneumonia) Repeat CXR today to ensure resolution. Clinically improved.   Nocturnal hypoxemia Continue supplemental oxygen at night. Continue to monitor during the day for goal>88-90%  Paroxysmal atrial fibrillation (HCC) Regular rhythm today during his OV. Follow up with cardiology as scheduled.    I spent 35 minutes of dedicated to the care of this patient on the date of this encounter to include pre-visit review of records, face-to-face time with the patient discussing conditions above, post visit ordering of testing, clinical documentation with the electronic health record, making appropriate referrals as documented, and communicating necessary findings to members of the patients care team.  Noemi Chapel, NP 07/04/2022  Pt aware and understands NP's role.

## 2022-07-04 NOTE — Assessment & Plan Note (Signed)
Regular rhythm today during his OV. Follow up with cardiology as scheduled.

## 2022-07-04 NOTE — Assessment & Plan Note (Signed)
Severe COPD with asthma overlap. He is back on triple therapy regimen with Trelegy; seems to be tolerating better than Breztri from a urinary standpoint. We will provide him with an additional sample and he will monitor over the next few weeks. As long as he continues to tolerate this, we can send new rx. Medication education/side effect profile reviewed. He does have some daily chest congestion - encouraged mucociliary clearance regimen. We will also obtain CXR to ensure previous pna has resolved. Does not appear to be in acute exacerbation. Encouraged to remain active. Action plan in place.   Patient Instructions  -Continue Albuterol inhaler 2 puffs or 3 mL neb every 6 hours as needed for shortness of breath or wheezing. Notify if symptoms persist despite rescue inhaler/neb use.  -Continue Trelegy 1 puff daily. Brush tongue and rinse mouth afterwards. Monitor for any urinary symptoms on this. If you don't have any difficulties, let us know so we can send a new prescription in -Do not use your Wixela if you are using the Trelegy -Stop the ipratropium nebulizer solution and only use the albuterol  -Continue azelastine 1 puff Twice daily for nasal congestion  -Continue dupixent injection every 2 weeks  -Continue guaifenesin (mucinex or other similar brand) over the counter as needed for cough/chest congestion -continue supplemental oxygen 2 lpm at night   Chest x ray today   Follow up in 8 weeks with Dr. Jayme Cloud or Katie Nasiah Lehenbauer,NP. If symptoms do not improve or worsen, please contact office for sooner follow up or seek emergency care.

## 2022-07-04 NOTE — Assessment & Plan Note (Signed)
See above. Maintained on triple therapy inhaler and biologic therapy.

## 2022-07-08 ENCOUNTER — Other Ambulatory Visit: Payer: Self-pay | Admitting: Pulmonary Disease

## 2022-07-08 ENCOUNTER — Telehealth: Payer: Self-pay

## 2022-07-08 ENCOUNTER — Other Ambulatory Visit: Payer: Self-pay

## 2022-07-08 DIAGNOSIS — J449 Chronic obstructive pulmonary disease, unspecified: Secondary | ICD-10-CM

## 2022-07-08 MED ORDER — TRELEGY ELLIPTA 200-62.5-25 MCG/ACT IN AEPB: 1.0000 | INHALATION_SPRAY | Freq: Every day | RESPIRATORY_TRACT | 5 refills | Status: DC

## 2022-07-08 NOTE — Telephone Encounter (Signed)
Patient seen 07/04/2022 and provided with sample of Telergy 100. He was unsure of dosage that he uses. Katie stated to provide patient with a sample of 100 and if he in fact has at home, Rx could be sent in. Patient seen message stating hat his Rx is for . Rx sent.  Nothing further needed.

## 2022-07-11 ENCOUNTER — Other Ambulatory Visit: Payer: Self-pay | Admitting: Pulmonary Disease

## 2022-07-11 DIAGNOSIS — J449 Chronic obstructive pulmonary disease, unspecified: Secondary | ICD-10-CM

## 2022-07-17 ENCOUNTER — Telehealth: Payer: Self-pay | Admitting: Internal Medicine

## 2022-07-17 NOTE — Telephone Encounter (Signed)
Patient called stating his hemorrhoids are currently bleeding. Requesting a call back to be advised on what he can do to help. Please advise, thank you.

## 2022-07-17 NOTE — Telephone Encounter (Signed)
Pyrtle pt on xarelto. States he has hemorrhoids that are bleeding and wants a prescription for something to help. He has used Prep h supp in the past but requesting a prescription. Please advise as DOD.

## 2022-07-18 ENCOUNTER — Other Ambulatory Visit: Payer: Self-pay

## 2022-07-18 MED ORDER — HYDROCORTISONE ACETATE 25 MG RE SUPP: 25.0000 mg | Freq: Two times a day (BID) | RECTAL | 0 refills | Status: DC

## 2022-07-18 NOTE — Telephone Encounter (Signed)
Spoke with pt and he is aware of recommendations. Prescription sent to the pharmacy.

## 2022-07-23 NOTE — Progress Notes (Signed)
Agree with the details of the visit as noted by Katherine Cobb, NP. ° °C. Laura Haydin Dunn, MD °Advanced Bronchoscopy °[facility] ° °

## 2022-07-24 ENCOUNTER — Telehealth: Payer: Self-pay | Admitting: Internal Medicine

## 2022-07-24 MED ORDER — DILTIAZEM HCL ER COATED BEADS 120 MG PO CP24: 120.0000 mg | ORAL_CAPSULE | Freq: Every day | ORAL | 0 refills | Status: DC

## 2022-07-24 NOTE — Telephone Encounter (Signed)
Requested Prescriptions   Signed Prescriptions Disp Refills   diltiazem (CARDIZEM CD) 120 MG 24 hr capsule 90 capsule 0    Sig: Take 1 capsule (120 mg total) by mouth daily.    Authorizing Provider: HAMMOCK, SHERI    Ordering User: Thayer Headings, Stanislaw Acton L

## 2022-07-24 NOTE — Telephone Encounter (Signed)
*  STAT* If patient is at the pharmacy, call can be transferred to refill team.   1. Which medications need to be refilled? (please list name of each medication and dose if known)  diltiazem (CARDIZEM CD) 120 MG 24 hr capsule   2. Which pharmacy/location (including street and city if local pharmacy) is medication to be sent to? [id number] - [place], [place] - [address]   3. Do they need a 30 day or 90 day supply? 90

## 2022-07-30 ENCOUNTER — Telehealth: Payer: Self-pay

## 2022-07-30 NOTE — Telephone Encounter (Signed)
Called and asked pt to do AWV- he refused

## 2022-09-02 ENCOUNTER — Other Ambulatory Visit: Payer: Self-pay | Admitting: Gastroenterology

## 2022-09-02 ENCOUNTER — Encounter: Payer: Self-pay | Admitting: Pulmonary Disease

## 2022-09-02 ENCOUNTER — Ambulatory Visit: Payer: PPO | Admitting: Pulmonary Disease

## 2022-09-02 VITALS — BP 118/70 | HR 68 | Temp 97.5°F | Ht 70.0 in | Wt 141.4 lb

## 2022-09-02 DIAGNOSIS — G4734 Idiopathic sleep related nonobstructive alveolar hypoventilation: Secondary | ICD-10-CM

## 2022-09-02 DIAGNOSIS — Z87898 Personal history of other specified conditions: Secondary | ICD-10-CM | POA: Diagnosis not present

## 2022-09-02 DIAGNOSIS — R0602 Shortness of breath: Secondary | ICD-10-CM | POA: Diagnosis not present

## 2022-09-02 DIAGNOSIS — J4550 Severe persistent asthma, uncomplicated: Secondary | ICD-10-CM

## 2022-09-02 DIAGNOSIS — J449 Chronic obstructive pulmonary disease, unspecified: Secondary | ICD-10-CM | POA: Diagnosis not present

## 2022-09-02 DIAGNOSIS — R0609 Other forms of dyspnea: Secondary | ICD-10-CM | POA: Diagnosis not present

## 2022-09-02 LAB — NITRIC OXIDE: Nitric Oxide: 41

## 2022-09-02 MED ORDER — ALBUTEROL SULFATE (2.5 MG/3ML) 0.083% IN NEBU
2.5000 mg | INHALATION_SOLUTION | Freq: Four times a day (QID) | RESPIRATORY_TRACT | 11 refills | Status: DC | PRN
Start: 2022-09-02 — End: 2023-03-03

## 2022-09-02 MED ORDER — DULERA 200-5 MCG/ACT IN AERO: 2.0000 | INHALATION_SPRAY | Freq: Two times a day (BID) | RESPIRATORY_TRACT | 11 refills | Status: DC

## 2022-09-02 NOTE — Progress Notes (Unsigned)
Subjective:    Patient ID: Antonio [name], male    DOB: 02-04-1946, 77 y.o.   MRN: [id number]  Patient Care Team: Duanne Limerick, MD as PCP - General (Family Medicine) Lanier Prude, MD as PCP - Electrophysiology (Cardiology) End, Cristal Deer, MD as PCP - Cardiology (Cardiology) Pyrtle, Carie Caddy, MD as Consulting Physician (Gastroenterology) Vanna Scotland, MD as Consulting Physician (Urology) Salena Saner, MD as Consulting Physician (Pulmonary Disease) Kemper Durie, RN as [facility] Management  Chief Complaint  Patient presents with   Follow-up    DOE. No wheezing or cough.     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  He was noted to have asthma with allergic phenotype and elevated IgE and peripheral eosinophilia. He was started on Dupixent on September 2020.  He has history significant for coronary artery disease and paroxysmal atrial fibrillation he is on Xarelto and flecainide.  He was last seen by me on 05 June 2022 after an admission to [facility] for pneumonia, he then was seen by our nurse practitioner Rhunette Croft on 04 Jul 2022.  He continues to note improvement on issues with wheezing and cough.  He is however short of breath on exertion.  He is on nocturnal oxygen at 2 L/min and notes benefit from the therapy.  Recall that he had issues with urinary retention with glycopyrrolate, ingredient in [employer].  He was then switched to Trelegy Ellipta and was able to tolerate this without issues with urinary retention however he does not like the medication due to the powdered preparation.  He discontinued its use.  Currently he is only using as needed albuterol with no maintenance medication.  I advised him that this is not recommended given the severity of his COPD and asthma.  He has not had any fevers, chills or sweats.  No sputum production or  hemoptysis.  He does not endorse any other symptomatology.    DATA 06/09/2014 pulmonary function test> ratio 51%, FEV1 1.47 L (40% predicted), total lung capacity 5.43 L (71% predicted). DLCO 16.26 (44% predicted) December 2017 lung function testing showed ratio 50%, FEV1 1.47 L 43% predicted, FVC 2.96 L 67% predicted March 2020 pulmonary function testing ratio 38%, FEV1 0.97 L 31% predicted, total lung capacity 8.44 L 119% predicted, residual volume 198% predicted, DLCO 14.0 55% predicted March 2020 high-resolution CT scan of the chest images : Centrilobular emphysema, no interstitial lung disease, aortic atherosclerosis 04/20/2018-eosinophils relative 7.3, eosinophils absolute 0.6 04/20/2018-IgE-67 01/20/2019-SARS-CoV-2-detected 03/06/2021 alpha 1 antitrypsin: Phenotype MM, level 165 03/11/2021 echocardiogram: LVEF 55%, right ventricular function normal.  No wall motion abnormalities. 04/03/2021 PFT: FEV1 1.09 L or 36% predicted, FVC 2.60 L or 61% predicted, FEV1/FVC 42%, no bronchodilator response. Lung volumes showed moderate air trapping.  Diffusion capacity severely decreased at 49%. 05/22/2022 chest x-ray 1 view: Patchy airspace disease in the medial right base concerning for pneumonia.    Review of Systems A 10 point review of systems was performed and it is as noted above otherwise negative.   Patient Active Problem List   Diagnosis Date Noted   Demand ischemia 05/24/2022   Pure hypercholesterolemia 05/24/2022   Primary hypertension 05/24/2022   Atrial fibrillation with rapid ventricular response (HCC) 05/22/2022   Sepsis due to pneumonia (HCC) 05/22/2022   CAP (community acquired pneumonia) 05/22/2022   Nocturnal hypoxemia 05/22/2022   BPH (benign prostatic hyperplasia) 05/22/2022  History of urinary retention 05/22/2022   Coronary artery disease involving native coronary artery of native heart without angina pectoris 03/21/2022   Precordial pain 02/19/2022   Atypical chest  pain 12/21/2021   DOE (dyspnea on exertion) 01/24/2021   Secondary hypercoagulable state (HCC) 12/19/2020   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   PAD ss/p SFA stent(peripheral artery disease) 11/08/2019   Paroxysmal atrial fibrillation (HCC) 09/02/2019   Stiffness of finger joint of left hand 04/06/2019   History of COVID-19 01/20/2019   Severe persistent asthma without complication 01/20/2019   Medication management 09/20/2018   Stage 3 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 (HCC) 09/20/2018   Increased eosinophils in the blood 06/21/2018   Diffusion capacity of lung (dl), decreased 16/11/9602   COPD with acute exacerbation (HCC) 06/22/2017   Atrial flutter, paroxysmal (HCC) 10/17/2016   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/2016   SVT (supraventricular tachycardia) 10/17/2016   Ulcerative colitis (HCC) 05/28/2016   Cervical stenosis of spinal canal 05/05/2016   Carotid stenosis 05/02/2016   Atherosclerosis of native arteries of extremity with intermittent claudication (HCC) 05/02/2016   Cramping of hands 01/10/2016   Bilateral carpal tunnel syndrome 12/06/2015   Centrilobular Emphysema 04/25/2014   Former smoker 04/25/2014   Tobacco abuse 04/25/2014    Social History   Tobacco Use   Smoking status: Former    Current packs/day: 0.00    Average packs/day: 0.5 packs/day for 35.0 years (17.5 ttl pk-yrs)    Types: Cigarettes, Cigars    Start date: 06/10/1976    Quit date: 06/11/2011    Years since quitting: 11.2   Smokeless tobacco: Never   Tobacco comments:    Former smoker 12/19/2020  Substance Use Topics   Alcohol use: Yes    Alcohol/week: 6.0 standard drinks of alcohol    Types: 6 Shots of liquor per week    Comment: wine, liquor, or beer    Allergies  Allergen Reactions   Penicillins Rash    Has patient had a PCN reaction causing immediate rash, facial/tongue/throat swelling, SOB or lightheadedness with hypotension:Yes Has patient had a PCN reaction causing severe rash involving mucus membranes  or skin necrosis:Yes Has patient had a PCN reaction that required hospitalization:No Has patient had a PCN reaction occurring within the last 10 years:No If all of the above answers are "NO", then may proceed with Cephalosporin use.     Current Meds  Medication Sig   albuterol (PROVENTIL) (2.5 MG/3ML) 0.083% nebulizer solution USE 1 VIAL VIA NEBULIZER EVERY 6 HOURS AS NEEDED FOR WHEEZING OR SHORTNESS OF BREATH   albuterol (VENTOLIN HFA) 108 (90 Base) MCG/ACT inhaler Inhale 2 puffs into the lungs every 6 (six) hours as needed for wheezing or shortness of breath.   Azelastine HCl 137 MCG/SPRAY SOLN PLACE 1 SPRAY INTO BOTH NOSTRILS TWICE DAILY (Patient taking differently: Place 1 spray into both nostrils 2 (two) times daily.)   diltiazem (CARDIZEM CD) 120 MG 24 hr capsule Take 1 capsule (120 mg total) by mouth daily.   dupilumab (DUPIXENT) 300 MG/2ML prefilled syringe Inject 300 mg into the skin every 14 (fourteen) days.   flecainide (TAMBOCOR) 50 MG tablet Take 1.5 tablets (75 mg total) by mouth 2 (two) times daily.   furosemide (LASIX) 20 MG tablet TAKE 1 TABLET BY MOUTH DAILY AS NEEDED (FOR SWELLING OR WEIGHT GAIN OF 3 LBS OVERNIGHT OR 5 LBS IN A WEEK). (Patient taking differently: Take 20 mg by mouth daily as needed for fluid or edema.)   ipratropium (ATROVENT) 0.02 % nebulizer solution  Take 2.5 mLs (0.5 mg total) by nebulization 4 (four) times daily.   mesalamine (LIALDA) 1.2 g EC tablet TAKE 4 TABLETS BY MOUTH DAILY WITH BREAKFAST (Patient taking differently: Take 4.8 g by mouth daily with breakfast. TAKE 4 TABLETS BY MOUTH DAILY WITH BREAKFAST)   metoprolol tartrate (LOPRESSOR) 25 MG tablet Take 1 tablet (25 mg total) by mouth 2 (two) times daily as needed.   mupirocin ointment (BACTROBAN) 2 % Apply 1 Application topically 2 (two) times daily.   nitroGLYCERIN (NITROSTAT) 0.4 MG SL tablet Place 1 tablet (0.4 mg total) under the tongue every 5 (five) minutes as needed for chest pain.    rivaroxaban (XARELTO) 20 MG TABS tablet Take 1 tablet (20 mg total) by mouth daily with supper.   tamsulosin (FLOMAX) 0.4 MG CAPS capsule Take 1 capsule (0.4 mg total) by mouth daily.    Immunization History  Administered Date(s) Administered   Fluad Quad(high Dose 65+) 11/01/2020, 10/29/2021   Influenza Inj Mdck Quad Pf 10/28/2017   Influenza Split 11/24/2013   Influenza, High Dose Seasonal PF 11/05/2016, 10/29/2017, 10/06/2018, 11/03/2019   Influenza,inj,Quad PF,6+ Mos 11/08/2014, 11/26/2015   Influenza-Unspecified 10/11/2013, 10/11/2013   PFIZER(Purple Top)SARS-COV-2 Vaccination 05/06/2019, 05/31/2019, 01/27/2020   PNEUMOCOCCAL CONJUGATE-20 06/03/2022   Pneumococcal Conjugate-13 04/18/2014   Pneumococcal Polysaccharide-23 11/26/2015   Tdap 02/13/2011   Zoster Recombinant(Shingrix) 12/01/2016, 03/02/2017   Zoster, Live 11/07/2010        Objective:     BP 118/70 (BP Location: Left Arm, Cuff Size: Normal)   Pulse 68   SpO2 99%   SpO2: 99 % O2 Device: None (Room air)  GENERAL: Thin well-developed gentleman, no acute distress.  Well-groomed, fully ambulatory without assistance.  No conversational dyspnea. HEAD: Normocephalic, atraumatic.  EYES: Pupils equal, round, reactive to light.  No scleral icterus.  MOUTH: Dentition intact.  Oral mucosa moist.  No thrush. NECK: Supple. No thyromegaly. Trachea midline. No JVD.  No adenopathy. PULMONARY: Distant breath sounds bilaterally.  No adventitious sounds. CARDIOVASCULAR: S1 and S2.  Regular rate and rhythm.  No rubs, murmurs or gallops heard. ABDOMEN: Benign. MUSCULOSKELETAL: No joint deformity, no clubbing, no edema.  Muscle tone improved.   NEUROLOGIC: No overt focal deficit, no gait disturbance, speech is fluent. SKIN: Intact,warm,dry. PSYCH: Mood and behavior normal.  Lab Results  Component Value Date   NITRICOXIDE 41 09/02/2022    Assessment & Plan:     ICD-10-CM   1. Severe persistent asthma without complication   J45.50    Type II inflammation present Needs controller medication Dulera 200/4.5, 2 inhalations twice a day    2. Stage 3 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 (HCC)  J44.9 albuterol (PROVENTIL) (2.5 MG/3ML) 0.083% nebulizer solution   Needs maintenance inhaler Did not tolerate Breztri due to urinary retention Disliked Trelegy due to powder Trial of Dulera as below Symbicort not covered    3. Shortness of breath  R06.02 Nitric oxide   Nitric oxide elevated    4. Nocturnal hypoxemia  G47.34    Compliant with oxygen supplementation Notes benefit of therapy    5. History of urinary retention  Z87.898    Avoid LAMA/SAMA     Orders Placed This Encounter  Procedures   Nitric oxide   Meds ordered this encounter  Medications   albuterol (PROVENTIL) (2.5 MG/3ML) 0.083% nebulizer solution    Sig: Take 3 mLs (2.5 mg total) by nebulization every 6 (six) hours as needed for wheezing or shortness of breath.    Dispense:  180  mL    Refill:  11   mometasone-formoterol (DULERA) 200-5 MCG/ACT AERO    Sig: Inhale 2 puffs into the lungs 2 (two) times daily. Rinse mouth well after use.    Dispense:  13 g    Refill:  11   Patient has not had adequate controller medications for his COPD/asthma overlap. He is compliant with Dupixent for his asthma component.  He is intolerant of glycopyrrolate due to urinary retention.  Did seem to tolerate umeclidinium side effects however, did not tolerate the powdered preparation.  Also did not feel that the medication controlled his symptoms well (Trelegy).  Symbicort previously worked well however his insurance has stopped covering it.  We will try Dulera 200/5, 2 inhalations twice a day.  We will see the patient in follow-up in 3 to 4 months time he is to contact us prior to that time should any new difficulties arise.   Gailen Shelter, MD Advanced Bronchoscopy PCCM [facility]    *This note was dictated using voice recognition  software/Dragon.  Despite best efforts to proofread, errors can occur which can change the meaning. Any transcriptional errors that result from this process are unintentional and may not be fully corrected at the time of dictation.

## 2022-09-02 NOTE — Patient Instructions (Signed)
The level of inflammation in your airway was elevated today.  Will need to stay on inhaler that reduces inflammation.  We have prescribed Dulera 200/5, 2 inhalations twice a day.  Make sure you rinse your mouth well after you use it.  We will see you in follow-up in 3 to 4 months time call sooner should any new problems arise.

## 2022-09-03 ENCOUNTER — Telehealth: Payer: Self-pay

## 2022-09-03 MED ORDER — DULERA 200-5 MCG/ACT IN AERO: 2.0000 | INHALATION_SPRAY | Freq: Two times a day (BID) | RESPIRATORY_TRACT | 3 refills | Status: DC

## 2022-09-03 NOTE — Telephone Encounter (Signed)
PriorAuth Team- can you see if there is a cheaper alternative to the [facility] that his insurance will pay for? Thank you!

## 2022-09-03 NOTE — Telephone Encounter (Signed)
-----   Message from [facility] sent at 09/03/2022  8:14 AM EDT ----- Regarding: Medication  From:Davey Dominga Ferry      Sent:09/02/2022  9:30 PM EDT      Seems like y Insurance will not cover the [facility] inhaler, BUT I can get it through [facility]for $300.00 cash or credit card or 3 inhalers for $525.00, I must send a written prescription to them for purchase.  I have dealt with these folks before for Lialda.  Please advise or let me come by and pick-up a written prescription. Thank you Oluwadarasimi

## 2022-09-04 ENCOUNTER — Encounter: Payer: Self-pay | Admitting: Pulmonary Disease

## 2022-09-05 ENCOUNTER — Other Ambulatory Visit (HOSPITAL_COMMUNITY): Payer: Self-pay

## 2022-09-05 ENCOUNTER — Telehealth: Payer: Self-pay | Admitting: Pulmonary Disease

## 2022-09-05 NOTE — Telephone Encounter (Signed)
Per test claims generic Advair Diskus/Wixela is preferred and covered for a 90 day supply at $10.00

## 2022-09-05 NOTE — Telephone Encounter (Signed)
Just an Lorain Childes- his insurance will cover the [place].   Nothing further needed.

## 2022-09-05 NOTE — Telephone Encounter (Signed)
Prior Auth team- any word on this patient's medication? Thank you!

## 2022-09-05 NOTE — Telephone Encounter (Signed)
Form from insurance has been filled out and faxed back to his insurance. Dr. Jayme Cloud to you want to try and get the [facility] approved or switch him to the Advair?

## 2022-09-05 NOTE — Telephone Encounter (Signed)
Disregard.  Thank you.

## 2022-09-05 NOTE — Telephone Encounter (Signed)
He has difficulties with powdered medications due to his asthma.  He does not tolerate the powdered medications at all.  I would like to try to procure the [facility] since the Symbicort that he was doing well on is no longer covered.

## 2022-09-06 NOTE — Telephone Encounter (Signed)
Glad it was resolved.

## 2022-09-09 ENCOUNTER — Ambulatory Visit: Payer: PPO | Attending: Cardiology | Admitting: Cardiology

## 2022-09-09 ENCOUNTER — Other Ambulatory Visit: Payer: Self-pay | Admitting: Gastroenterology

## 2022-09-09 ENCOUNTER — Encounter: Payer: Self-pay | Admitting: Cardiology

## 2022-09-09 VITALS — BP 118/60 | HR 52 | Ht 70.0 in | Wt 141.0 lb

## 2022-09-09 DIAGNOSIS — I739 Peripheral vascular disease, unspecified: Secondary | ICD-10-CM

## 2022-09-09 DIAGNOSIS — I480 Paroxysmal atrial fibrillation: Secondary | ICD-10-CM | POA: Diagnosis not present

## 2022-09-09 DIAGNOSIS — I10 Essential (primary) hypertension: Secondary | ICD-10-CM | POA: Diagnosis not present

## 2022-09-09 DIAGNOSIS — I2510 Atherosclerotic heart disease of native coronary artery without angina pectoris: Secondary | ICD-10-CM

## 2022-09-09 DIAGNOSIS — J449 Chronic obstructive pulmonary disease, unspecified: Secondary | ICD-10-CM | POA: Diagnosis not present

## 2022-09-09 DIAGNOSIS — E782 Mixed hyperlipidemia: Secondary | ICD-10-CM | POA: Diagnosis not present

## 2022-09-09 NOTE — Patient Instructions (Signed)
Medication Instructions:  Your physician recommends that you continue on your current medications as directed. Please refer to the Current Medication list given to you today.  *If you need a refill on your cardiac medications before your next appointment, please call your pharmacy*   Lab Work: none If you have labs (blood work) drawn today and your tests are completely normal, you will receive your results only by: MyChart Message (if you have MyChart) OR A paper copy in the mail If you have any lab test that is abnormal or we need to change your treatment, we will call you to review the results.   Testing/Procedures: none   Follow-Up: At [facility], you and your health needs are our priority.  As part of our continuing mission to provide you with exceptional heart care, we have created designated Provider Care Teams.  These Care Teams include your primary Cardiologist (physician) and Advanced Practice Providers (APPs -  Physician Assistants and Nurse Practitioners) who all work together to provide you with the care you need, when you need it.  We recommend signing up for the patient portal called "MyChart".  Sign up information is provided on this After Visit Summary.  MyChart is used to connect with patients for Virtual Visits (Telemedicine).  Patients are able to view lab/test results, encounter notes, upcoming appointments, etc.  Non-urgent messages can be sent to your provider as well.   To learn more about what you can do with MyChart, go to [email or web address].    Your next appointment:   3 month(s)  Provider:   You may see Yvonne Kendall, MD or one of the following Advanced Practice Providers on your designated Care Team:   Charlsie Quest, NP

## 2022-09-09 NOTE — Progress Notes (Signed)
Cardiology Office Note:  .   Date:  09/09/2022  ID:  Antonio [name], Antonio [name] 1946-02-01, MRN [id number] PCP: Duanne Limerick, MD  [facility] Providers Cardiologist:  Yvonne Kendall, MD Electrophysiologist:  Lanier Prude, MD    History of Present Illness: .   Antonio [name] is a 77 y.o. male with past medical history of paroxysmal atrial flutter/fibrillation on Xarelto, nonobstructive coronary artery disease, severe COPD, PSVT, PAD status post left SFA stenting, hypertension, hyperlipidemia, ulcerative colitis, BPH, who returns today for follow-up.  Echocardiogram in January 2023 showed normal LV function.  Stress testing in May 2023 in the setting of chest tightness revealed normal LV function without ischemia or infarct.  Coronary calcium was noted on scans.  In January 2024 in the setting of recurrent chest discomfort the decision was made to pursue coronary CTA which showed mild nonobstructive CAD.  Paroxysmal atrial fibrillation/atrial flutter is continue to be followed by EP.  It was felt he was not a candidate for advanced procedure secondary to severe lung disease.  He was continued to be managed with flecainide and Xarelto.  Earlier in the year metoprolol dose was reduced secondary to baseline bradycardia which limited his current dosing.  On 05/19/22 he had symptoms of mild dyspnea, malaise, and pleuritic back pain which progressed throughout the week became associated with chest tightness and worsening dyspnea.  On 05/22/2022 he presented to the [facility] emergency department where he was found to be in atrial fibrillation with RVR with rates in the 170s 180s.  He had a fever of 102.5, white count of 13,000, lactic acid elevated 2.7.  High-sensitivity troponin was mildly elevated with peak of 34.  He was placed on IV diltiazem as well as antibiotics for management of pneumonia and sepsis and was admitted for continued evaluation.  He converted to sinus rhythm and was  maintained on IV diltiazem.  Home doses of flecainide and metoprolol were continued.  He was continued on rate control with diltiazem and flecainide and metoprolol was discontinued.  He was able to be discharged on 05/28/2022.  He was last seen in clinic 06/10/2022 at that time he stated overall he has been doing well.  He still continued to have some occasional shortness of breath that was slowly improving but he had had no further episodes of chest discomfort or palpitations.  He does have follow-up with pulmonary and had his inhalers changed as well.  He returns to clinic today accompanied by his wife. Overall he has been doing well. He has questions today related to his medications and if there are any he can stop taking.  He continues to have chronic shortness of breath that is unchanged from his longstanding COPD.  Denies any chest pain, palpitations, lightheadedness or dizziness.  Does occasionally endorse peripheral edema to his bilateral lower extremities predominantly around his ankles.  Has had 1 episode of elevated heart rates.  Took a as needed dose of metoprolol.  Since that time he has not required any further as needed doses.  He also continues to only take his furosemide on an as-needed basis.  He has had no recent hospitalizations or visits to the emergency department.  ROS: 10 point review of system has been reviewed and considered negative with exception of what is been listed in the HPI  Studies Reviewed: Marland Kitchen   EKG Interpretation Date/Time:  Tuesday September 09 2022 10:06:24 EDT Ventricular Rate:  52 PR Interval:  200 QRS Duration:  82 QT  Interval:  426 QTC Calculation: 396 R Axis:   69  Text Interpretation: Sinus bradycardia When compared with ECG of 22-May-2022 16:27, Sinus rhythm has replaced Atrial fibrillation Vent. rate has decreased BY 121 BPM ST no longer depressed in Inferior leads ST no longer depressed in Anterolateral leads Confirmed by Charlsie Quest ([id number]) on 09/09/2022  10:07:39 AM    Coronary CTA 03/06/22 IMPRESSION: 1. Coronary calcium score of 251. This was 42nd percentile for age and sex matched control.   2. Normal coronary origin with left dominance.   3. Mild distal LM and proximal LAD stenosis (25-49%)   4. CAD-RADS 2. Mild non-obstructive CAD (25-49%). Consider non-atherosclerotic causes of chest pain. Consider preventive therapy and risk factor modification.  TTE 03/11/21 1. Left ventricular ejection fraction, by estimation, is 55%. Left  ventricular ejection fraction by 2D MOD biplane is 54.2 %. The left  ventricle has normal function. The left ventricle has no regional wall  motion abnormalities. Left ventricular  diastolic parameters were normal.   2. Right ventricular systolic function is normal. The right ventricular  size is normal.   3. The mitral valve is normal in structure. No evidence of mitral valve  regurgitation.   4. The aortic valve is tricuspid. Aortic valve regurgitation is not  visualized.   5. The inferior vena cava is normal in size with greater than 50%  respiratory variability, suggesting right atrial pressure of 3 mmHg.   Risk Assessment/Calculations:    CHA2DS2-VASc Score = 4   This indicates a 4.8% annual risk of stroke. The patient's score is based upon: CHF History: 0 HTN History: 1 Diabetes History: 0 Stroke History: 0 Vascular Disease History: 1 Age Score: 2 Gender Score: 0            Physical Exam:   VS:  BP 118/60 (BP Location: Left Arm, Patient Position: Sitting, Cuff Size: Normal)   Pulse (!) 52   Ht 5\' 10"  (1.778 m)   Wt 141 lb (64 kg)   SpO2 99%   BMI 20.23 kg/m    Wt Readings from Last 3 Encounters:  09/09/22 141 lb (64 kg)  09/02/22 141 lb 6.4 oz (64.1 kg)  07/04/22 139 lb 6.4 oz (63.2 kg)    GEN: Well nourished, well developed in no acute distress NECK: No JVD; No carotid bruits CARDIAC: RRR, bradycardic no murmurs, rubs, gallops RESPIRATORY:  Clear upper lobes with  diminished bases to auscultation without rales, wheezing or rhonchi  ABDOMEN: Soft, non-tender, non-distended EXTREMITIES: Trace pretibial edema; No deformity   ASSESSMENT AND PLAN: .   Paroxysmal atrial fibrillation/atrial flutter that is sinus with sinus bradycardia on EKG today.  He is continued on rivaroxaban 20 mg daily for CHA2DS2-VASc score of at least 4 for stroke prophylaxis.  Had previously been hospitalized with atrial fibrillation RVR rates of 160-170 in the setting of sepsis and CAP.  He is continued on diltiazem 120 mg daily, flecainide 75 mg twice daily, metoprolol tartrate 25 mg as needed.  He also continues to follow with EP.  Nonobstructive coronary artery disease without any chest discomfort or anginal equivalent.  EKG reveals sinus bradycardia without ischemia noted today.  Coronary CTA in January 2024 showed mild nonobstructive coronary artery disease.  He is continued on rivaroxaban and lieu of aspirin and rosuvastatin 10 mg daily.  He has Nitrostat as needed but has not required the use of any of this medication.  Primary hypertension with blood pressure today 118/60.  Blood pressures remained stable.  He is continued on his current medication regimen.  Encouraged to monitor blood pressures at home as well 1 to 2 hours post medication.  Hyperlipidemia with his last LDL 50 in June 2024.  Continue rosuvastatin 10 mg daily.  LDL remains at goal.  Peripheral arterial disease with claudication be reported.  He is continued on statin therapy rivaroxaban and lieu of aspirin.  He has continued ongoing management per vascular surgery.  Severe COPD where he continues to follow with Dr. Jayme Cloud as outpatient.  He is continued on oxygen 2 L/min due to nocturnal hypoxia and emphysema.       Dispo: Patient to return to clinic to see MD/APP in 3 months or sooner if needed  Signed, Littie Chiem, NP

## 2022-09-25 ENCOUNTER — Other Ambulatory Visit: Payer: Self-pay | Admitting: Gastroenterology

## 2022-09-25 MED ORDER — HYDROCORTISONE ACETATE 25 MG RE SUPP: 25.0000 mg | Freq: Two times a day (BID) | RECTAL | 0 refills | Status: DC

## 2022-09-25 NOTE — Telephone Encounter (Signed)
Received automated refill request from pharmacy for suppositories. The patient requests another refill of suppositories and to be scheduled for hemorrhoid banding. Please advise if I can refill and if you wish to get patient scheduled for a banding appt?

## 2022-10-02 ENCOUNTER — Telehealth: Payer: Self-pay | Admitting: Internal Medicine

## 2022-10-02 NOTE — Telephone Encounter (Signed)
Pt aware of recommendations per Dr. Rhea Belton. Pt has OV scheduled and will keep this appt.

## 2022-10-02 NOTE — Telephone Encounter (Signed)
Pt has had banding done previously.Pt states his hemorrhoids are bleeding again. Reports it started 2 days ago, brb when having a BM. States it colors the toilet bowl. He states that he does have some supp. That Dr. Rhea Belton has given him in the past but wants to know if there is something else Dr. Rhea Belton would recommend, He reports the blood does not come out unless he has a bm but he is wearing depends. Please advise.  Alternate numbers for pt:[phone], [phone]

## 2022-10-02 NOTE — Telephone Encounter (Signed)
Anusol supp 25 mg at bedtime x 5 nights Stool softeners if needed to avoid hard stools Can offer repeat banding if this continues No other great options for this

## 2022-10-02 NOTE — Telephone Encounter (Signed)
Inbound call from patient stating that he called yesterday and never heard anything from anyone. I do not see a message that was sent.   Patient stated he is having issues with bleeding hemorrhoids. Patient is requesting a call  back to discuss. Please advise.

## 2022-10-09 ENCOUNTER — Telehealth: Payer: Self-pay | Admitting: Family Medicine

## 2022-10-09 NOTE — Telephone Encounter (Signed)
Copied from CRM [id number]. Topic: General - Other >> Oct 09, 2022  9:59 AM Turkey B wrote: Reason for CRM: pt called in wants to know if he has had the RSV test. Please call back

## 2022-10-16 ENCOUNTER — Encounter: Payer: Self-pay | Admitting: Family Medicine

## 2022-10-16 ENCOUNTER — Ambulatory Visit (INDEPENDENT_AMBULATORY_CARE_PROVIDER_SITE_OTHER): Payer: PPO | Admitting: Family Medicine

## 2022-10-16 ENCOUNTER — Other Ambulatory Visit: Payer: Self-pay | Admitting: Internal Medicine

## 2022-10-16 VITALS — BP 110/60 | HR 57 | Ht 68.0 in | Wt 137.0 lb

## 2022-10-16 DIAGNOSIS — R7989 Other specified abnormal findings of blood chemistry: Secondary | ICD-10-CM | POA: Diagnosis not present

## 2022-10-16 DIAGNOSIS — D508 Other iron deficiency anemias: Secondary | ICD-10-CM

## 2022-10-16 DIAGNOSIS — Z23 Encounter for immunization: Secondary | ICD-10-CM

## 2022-10-16 NOTE — Progress Notes (Signed)
Date:  10/16/2022   Name:  Antonio [name]   DOB:  March 09, 1945   MRN:  [id number]   Chief Complaint: Anemia and Flu Vaccine  Anemia Presents for follow-up visit. There has been no abdominal pain, anorexia, bruising/bleeding easily, fever, light-headedness, palpitations or weight loss. Signs of blood loss that are not present include hematemesis, hematochezia and melena. There are no compliance problems.     Lab Results  Component Value Date   NA 138 05/28/2022   K 4.0 05/28/2022   CO2 27 05/28/2022   GLUCOSE 107 (H) 05/28/2022   BUN 9 05/28/2022   CREATININE 0.73 05/28/2022   CALCIUM 8.7 (L) 05/28/2022   GFRNONAA >60 05/28/2022   Lab Results  Component Value Date   CHOL 127 02/19/2022   HDL 55 02/19/2022   LDLCALC 57 02/19/2022   TRIG 73 02/19/2022   CHOLHDL 2.3 02/19/2022   Lab Results  Component Value Date   TSH 1.783 07/03/2021   No results found for: "HGBA1C" Lab Results  Component Value Date   WBC 10.1 05/27/2022   HGB 11.7 (L) 05/27/2022   HCT 35.6 (L) 05/27/2022   MCV 93.0 05/27/2022   PLT 223 05/27/2022   Lab Results  Component Value Date   ALT 15 05/26/2022   AST 24 05/26/2022   ALKPHOS 91 05/26/2022   BILITOT 0.7 05/26/2022   No results found for: "25OHVITD2", "25OHVITD3", "VD25OH"   Review of Systems  Constitutional:  Negative for fever and weight loss.  Eyes:  Negative for photophobia.  Respiratory:  Negative for chest tightness and shortness of breath.   Cardiovascular:  Negative for chest pain, palpitations and leg swelling.  Gastrointestinal:  Negative for abdominal distention, abdominal pain, anorexia, hematemesis, hematochezia and melena.  Endocrine: Negative for polydipsia and polyuria.  Neurological:  Negative for light-headedness.  Hematological:  Does not bruise/bleed easily.    Patient Active Problem List   Diagnosis Date Noted   Demand ischemia 05/24/2022   Pure hypercholesterolemia 05/24/2022   Primary hypertension  05/24/2022   Atrial fibrillation with rapid ventricular response (HCC) 05/22/2022   Sepsis due to pneumonia (HCC) 05/22/2022   CAP (community acquired pneumonia) 05/22/2022   Nocturnal hypoxemia 05/22/2022   BPH (benign prostatic hyperplasia) 05/22/2022   History of urinary retention 05/22/2022   Coronary artery disease involving native coronary artery of native heart without angina pectoris 03/21/2022   Precordial pain 02/19/2022   Atypical chest pain 12/21/2021   DOE (dyspnea on exertion) 01/24/2021   Secondary hypercoagulable state (HCC) 12/19/2020   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   PAD ss/p SFA stent(peripheral artery disease) 11/08/2019   Paroxysmal atrial fibrillation (HCC) 09/02/2019   Stiffness of finger joint of left hand 04/06/2019   History of COVID-19 01/20/2019   Severe persistent asthma without complication 01/20/2019   Medication management 09/20/2018   Stage 3 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 (HCC) 09/20/2018   Increased eosinophils in the blood 06/21/2018   Diffusion capacity of lung (dl), decreased 78/29/5621   COPD with acute exacerbation (HCC) 06/22/2017   Atrial flutter, paroxysmal (HCC) 10/17/2016   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/2016   SVT (supraventricular tachycardia) 10/17/2016   Ulcerative colitis (HCC) 05/28/2016   Cervical stenosis of spinal canal 05/05/2016   Carotid stenosis 05/02/2016   Atherosclerosis of native arteries of extremity with intermittent claudication (HCC) 05/02/2016   Cramping of hands 01/10/2016   Bilateral carpal tunnel syndrome 12/06/2015   Centrilobular Emphysema 04/25/2014   Former smoker 04/25/2014   Tobacco abuse 04/25/2014  Allergies  Allergen Reactions   Penicillins Rash    Has patient had a PCN reaction causing immediate rash, facial/tongue/throat swelling, SOB or lightheadedness with hypotension:Yes Has patient had a PCN reaction causing severe rash involving mucus membranes or skin necrosis:Yes Has patient had a PCN reaction  that required hospitalization:No Has patient had a PCN reaction occurring within the last 10 years:No If all of the above answers are "NO", then may proceed with Cephalosporin use.     Past Surgical History:  Procedure Laterality Date   ANTERIOR CERVICAL DECOMP/DISCECTOMY FUSION N/A 05/05/2016   Procedure: ANTERIOR CERVICAL DECOMPRESSION FUSION CERVICAL THREE-FOUR.;  Surgeon: Julio Sicks, MD;  Location: [facility];  Service: Neurosurgery;  Laterality: N/A;  right side approach   CATARACT EXTRACTION Bilateral    CERVICAL DISCECTOMY  04/2016   C3-C4   COLONOSCOPY     COLONOSCOPY  08/09/2015   HEMORRHOID SURGERY  1988   leg stent Left approx 5-6 yrs ago   2 to left leg   MENISCUS REPAIR Left 12/2013   MOHS SURGERY     POLYPECTOMY     ROTATOR CUFF REPAIR Left 2007   TONSILLECTOMY AND ADENOIDECTOMY  1957    Social History   Tobacco Use   Smoking status: Former    Current packs/day: 0.00    Average packs/day: 0.5 packs/day for 35.0 years (17.5 ttl pk-yrs)    Types: Cigarettes, Cigars    Start date: 06/10/1976    Quit date: 06/11/2011    Years since quitting: 11.3   Smokeless tobacco: Never   Tobacco comments:    Former smoker 12/19/2020  Vaping Use   Vaping status: Former  Substance Use Topics   Alcohol use: Yes    Alcohol/week: 6.0 standard drinks of alcohol    Types: 6 Shots of liquor per week    Comment: wine, liquor, or beer   Drug use: No     Medication list has been reviewed and updated.  Current Meds  Medication Sig   albuterol (PROVENTIL) (2.5 MG/3ML) 0.083% nebulizer solution Take 3 mLs (2.5 mg total) by nebulization every 6 (six) hours as needed for wheezing or shortness of breath.   Azelastine HCl 137 MCG/SPRAY SOLN PLACE 1 SPRAY INTO BOTH NOSTRILS TWICE DAILY (Patient taking differently: Place 1 spray into both nostrils 2 (two) times daily.)   diltiazem (CARDIZEM CD) 120 MG 24 hr capsule Take 1 capsule (120 mg total) by mouth daily.   dupilumab (DUPIXENT) 300  MG/2ML prefilled syringe Inject 300 mg into the skin every 14 (fourteen) days.   famotidine (PEPCID) 20 MG tablet Take 1 tablet (20 mg total) by mouth 2 (two) times daily as needed for heartburn or indigestion.   flecainide (TAMBOCOR) 50 MG tablet Take 1.5 tablets (75 mg total) by mouth 2 (two) times daily.   furosemide (LASIX) 20 MG tablet TAKE 1 TABLET BY MOUTH DAILY AS NEEDED (FOR SWELLING OR WEIGHT GAIN OF 3 LBS OVERNIGHT OR 5 LBS IN A WEEK). (Patient taking differently: Take 20 mg by mouth daily as needed for fluid or edema.)   hydrocortisone (ANUSOL-HC) 25 MG suppository Place 1 suppository (25 mg total) rectally 2 (two) times daily.   ipratropium (ATROVENT) 0.02 % nebulizer solution Take 2.5 mLs (0.5 mg total) by nebulization 4 (four) times daily.   mesalamine (LIALDA) 1.2 g EC tablet TAKE 4 TABLETS BY MOUTH DAILY WITH BREAKFAST (Patient taking differently: Take 4.8 g by mouth daily with breakfast. TAKE 4 TABLETS BY MOUTH DAILY WITH BREAKFAST)  metoprolol tartrate (LOPRESSOR) 25 MG tablet Take 1 tablet (25 mg total) by mouth 2 (two) times daily as needed.   mometasone-formoterol (DULERA) 200-5 MCG/ACT AERO Inhale 2 puffs into the lungs 2 (two) times daily. Rinse mouth well after use.   rivaroxaban (XARELTO) 20 MG TABS tablet Take 1 tablet (20 mg total) by mouth daily with supper.   rosuvastatin (CRESTOR) 10 MG tablet TAKE 1 TABLET BY MOUTH DAILY   tamsulosin (FLOMAX) 0.4 MG CAPS capsule Take 1 capsule (0.4 mg total) by mouth daily.       10/16/2022    2:07 PM 06/03/2022    1:19 PM 12/30/2021    3:00 PM 12/16/2021   11:39 AM  GAD 7 : Generalized Anxiety Score  Nervous, Anxious, on Edge 0 0 0 0  Control/stop worrying 0 0 0 0  Worry too much - different things 0 0 0 0  Trouble relaxing 0 0 0 0  Restless 0 0 0 0  Easily annoyed or irritable 0 0 0 0  Afraid - awful might happen 0 0 0 0  Total GAD 7 Score 0 0 0 0  Anxiety Difficulty Not difficult at all Not difficult at all Not difficult  at all Not difficult at all       10/16/2022    2:07 PM 06/03/2022    1:18 PM 12/30/2021    3:00 PM  Depression screen PHQ 2/9  Decreased Interest 0 0 0  Down, Depressed, Hopeless 0 0 0  PHQ - 2 Score 0 0 0  Altered sleeping 0 0 0  Tired, decreased energy 0 0 0  Change in appetite 0 0 0  Feeling bad or failure about yourself  0 0 0  Trouble concentrating 0 0 0  Moving slowly or fidgety/restless 0 0 0  Suicidal thoughts 0 0 0  PHQ-9 Score 0 0 0  Difficult doing work/chores Not difficult at all Not difficult at all Not difficult at all    BP Readings from Last 3 Encounters:  10/16/22 110/60  09/09/22 118/60  09/02/22 118/70    Physical Exam Vitals and nursing note reviewed.  HENT:     Head: Normocephalic.     Right Ear: Tympanic membrane, ear canal and external ear normal.     Left Ear: Tympanic membrane, ear canal and external ear normal.     Nose: Nose normal. No congestion or rhinorrhea.     Mouth/Throat:     Mouth: Mucous membranes are moist.  Eyes:     General: No scleral icterus.       Right eye: No discharge.        Left eye: No discharge.     Conjunctiva/sclera: Conjunctivae normal.     Pupils: Pupils are equal, round, and reactive to light.  Neck:     Thyroid: No thyromegaly.     Vascular: No JVD.     Trachea: No tracheal deviation.  Cardiovascular:     Rate and Rhythm: Normal rate and regular rhythm.     Heart sounds: Normal heart sounds. No murmur heard.    No friction rub. No gallop.  Pulmonary:     Effort: No respiratory distress.     Breath sounds: Normal breath sounds. No wheezing, rhonchi or rales.  Abdominal:     General: Bowel sounds are normal.     Palpations: Abdomen is soft. There is no mass.     Tenderness: There is no abdominal tenderness. There is no guarding or rebound.  Musculoskeletal:     Cervical back: Normal range of motion and neck supple.  Lymphadenopathy:     Cervical: No cervical adenopathy.  Skin:    General: Skin is warm.   Neurological:     Mental Status: He is alert.     Deep Tendon Reflexes: Reflexes are normal and symmetric.     Wt Readings from Last 3 Encounters:  10/16/22 137 lb (62.1 kg)  09/09/22 141 lb (64 kg)  09/02/22 141 lb 6.4 oz (64.1 kg)    BP 110/60   Pulse (!) 57   Ht 5\' 8"  (1.727 m)   Wt 137 lb (62.1 kg)   BMI 20.83 kg/m   Assessment and Plan: 1. Other iron deficiency anemia Chronic.  Presumably controlled.  Stable.  Patient with history of anemia and we will recheck CBC to see what current status of anemia is and whether further evaluation needs to be done.  Will start with CBC and differential platelet counts.  Patient does have a history of hemorrhoids that have been bleeding in the past and is undergoing further therapy with GI concerning this circumstance. - CBC w/Diff/Platelet  2. Low serum calcium Patient history with low calcium and we will check a renal function panel to see current status of albumin and calcium. - Renal Function Panel  3. Need for immunization against influenza Discussed and administered. - Flu Vaccine Trivalent High Dose (Fluad)     Elizabeth Sauer, MD

## 2022-10-17 ENCOUNTER — Encounter: Payer: Self-pay | Admitting: Family Medicine

## 2022-10-17 LAB — CBC WITH DIFFERENTIAL/PLATELET
Basophils Absolute: 0.1 10*3/uL (ref 0.0–0.2)
Basos: 1 %
EOS (ABSOLUTE): 0.3 10*3/uL (ref 0.0–0.4)
Eos: 4 %
Hematocrit: 40.7 % (ref 37.5–51.0)
Hemoglobin: 13.9 g/dL (ref 13.0–17.7)
Immature Grans (Abs): 0.1 10*3/uL (ref 0.0–0.1)
Immature Granulocytes: 1 %
Lymphocytes Absolute: 1.4 10*3/uL (ref 0.7–3.1)
Lymphs: 16 %
MCH: 31.2 pg (ref 26.6–33.0)
MCHC: 34.2 g/dL (ref 31.5–35.7)
MCV: 91 fL (ref 79–97)
Monocytes Absolute: 0.9 10*3/uL (ref 0.1–0.9)
Monocytes: 11 %
Neutrophils Absolute: 5.9 10*3/uL (ref 1.4–7.0)
Neutrophils: 67 %
Platelets: 161 10*3/uL (ref 150–450)
RBC: 4.46 x10E6/uL (ref 4.14–5.80)
RDW: 11.4 % — ABNORMAL LOW (ref 11.6–15.4)
WBC: 8.7 10*3/uL (ref 3.4–10.8)

## 2022-10-17 LAB — RENAL FUNCTION PANEL
Albumin: 4.4 g/dL (ref 3.8–4.8)
BUN/Creatinine Ratio: 10 (ref 10–24)
BUN: 9 mg/dL (ref 8–27)
CO2: 22 mmol/L (ref 20–29)
Calcium: 9.3 mg/dL (ref 8.6–10.2)
Chloride: 102 mmol/L (ref 96–106)
Creatinine, Ser: 0.89 mg/dL (ref 0.76–1.27)
Glucose: 87 mg/dL (ref 70–99)
Phosphorus: 2.6 mg/dL — ABNORMAL LOW (ref 2.8–4.1)
Potassium: 4.4 mmol/L (ref 3.5–5.2)
Sodium: 140 mmol/L (ref 134–144)
eGFR: 87 mL/min/{1.73_m2} (ref >59)

## 2022-10-20 ENCOUNTER — Other Ambulatory Visit: Payer: Self-pay | Admitting: Cardiology

## 2022-10-31 ENCOUNTER — Telehealth: Payer: Self-pay | Admitting: Pulmonary Disease

## 2022-10-31 MED ORDER — AZITHROMYCIN 250 MG PO TABS: ORAL_TABLET | ORAL | 0 refills | Status: AC

## 2022-10-31 MED ORDER — METHYLPREDNISOLONE 4 MG PO TBPK: ORAL_TABLET | ORAL | 0 refills | Status: DC

## 2022-10-31 NOTE — Telephone Encounter (Signed)
Arline Asp wife states patient having symptoms of cough, runny nose and shortness of breath. Pharmacy is [facility] [place], [place] phone number is [phone].

## 2022-10-31 NOTE — Telephone Encounter (Signed)
Lets send a Medrol Dosepak and an Azithromycin Dosepak.  Continue to monitor oxygen levels.  If he drops below 89% he will need to be seen in the emergency room.

## 2022-10-31 NOTE — Telephone Encounter (Signed)
Spoke to patient. He stated that he developed cold like sx 3 days ago.  C/O prod cough with clear sputum, head congestion,  SOB is baseline. Denied f/c/s, wheezing or additional sx.  Neg covid test yesterday.  He is using Dulera BID and albuterol TID.  He wears 2L at bedtime. Spo2 maintaining between 92-96%.   Dr. Jayme Cloud, please advise. Thanks

## 2022-10-31 NOTE — Telephone Encounter (Signed)
Patient's spouse, Cindy(DPR) is aware of recommendations and voiced her understanding. Zpak and prednisone has been sent to preferred pharmacy. Nothing further needed.

## 2022-11-08 ENCOUNTER — Encounter: Payer: Self-pay | Admitting: Pulmonary Disease

## 2022-11-08 ENCOUNTER — Other Ambulatory Visit: Payer: Self-pay | Admitting: Cardiology

## 2022-11-10 ENCOUNTER — Telehealth: Payer: Self-pay | Admitting: Pulmonary Disease

## 2022-11-10 NOTE — Telephone Encounter (Signed)
Pt coughing extremely, requesting something be sent in    [facility] [place]

## 2022-11-11 NOTE — Telephone Encounter (Signed)
Pt coughing extremely, requesting something be sent in    [facility] [place]

## 2022-11-12 ENCOUNTER — Ambulatory Visit
Admission: RE | Admit: 2022-11-12 | Discharge: 2022-11-12 | Disposition: A | Discharge status: Home / Self Care | Payer: PPO | Source: Ambulatory Visit | Attending: Pulmonary Disease | Admitting: Pulmonary Disease

## 2022-11-12 ENCOUNTER — Encounter: Payer: Self-pay | Admitting: Pulmonary Disease

## 2022-11-12 ENCOUNTER — Ambulatory Visit (INDEPENDENT_AMBULATORY_CARE_PROVIDER_SITE_OTHER): Payer: PPO | Admitting: Pulmonary Disease

## 2022-11-12 VITALS — BP 118/70 | HR 85 | Temp 98.4°F | Ht 68.0 in | Wt 133.8 lb

## 2022-11-12 DIAGNOSIS — J441 Chronic obstructive pulmonary disease with (acute) exacerbation: Secondary | ICD-10-CM | POA: Insufficient documentation

## 2022-11-12 DIAGNOSIS — J4489 Other specified chronic obstructive pulmonary disease: Secondary | ICD-10-CM

## 2022-11-12 DIAGNOSIS — G4736 Sleep related hypoventilation in conditions classified elsewhere: Secondary | ICD-10-CM | POA: Diagnosis not present

## 2022-11-12 DIAGNOSIS — J440 Chronic obstructive pulmonary disease with acute lower respiratory infection: Secondary | ICD-10-CM | POA: Diagnosis not present

## 2022-11-12 DIAGNOSIS — I480 Paroxysmal atrial fibrillation: Secondary | ICD-10-CM

## 2022-11-12 DIAGNOSIS — R0602 Shortness of breath: Secondary | ICD-10-CM | POA: Diagnosis not present

## 2022-11-12 DIAGNOSIS — J439 Emphysema, unspecified: Secondary | ICD-10-CM

## 2022-11-12 DIAGNOSIS — R918 Other nonspecific abnormal finding of lung field: Secondary | ICD-10-CM | POA: Diagnosis not present

## 2022-11-12 MED ORDER — GUAIFENESIN-CODEINE 100-10 MG/5ML PO SYRP: 5.0000 mL | ORAL_SOLUTION | Freq: Three times a day (TID) | ORAL | 0 refills | Status: DC | PRN

## 2022-11-12 MED ORDER — CEFDINIR 300 MG PO CAPS: 300.0000 mg | ORAL_CAPSULE | Freq: Two times a day (BID) | ORAL | 0 refills | Status: AC

## 2022-11-12 MED ORDER — METHYLPREDNISOLONE 4 MG PO TBPK: ORAL_TABLET | ORAL | 0 refills | Status: DC

## 2022-11-12 NOTE — Patient Instructions (Signed)
We have sent in prescriptions to your pharmacy you have a prescription for a prednisone like medication, an antibiotic and a cough syrup.  Please stop by the x-ray department to get a chest x-ray done today.  We will call you with the results.  We will see you in follow-up at your scheduled visit in 2 weeks on 17 October.  Call sooner should any new problems arise or if your symptoms worsen.

## 2022-11-12 NOTE — Telephone Encounter (Signed)
Spoke to patient and scheduled appt for 11/12/2022 at 2:30. Nothing further needed.

## 2022-11-12 NOTE — Telephone Encounter (Signed)
Due for follow up with EP, please schedule.  Thanks!

## 2022-11-12 NOTE — Telephone Encounter (Signed)
See if we can bring him in today at 2:30.

## 2022-11-12 NOTE — Progress Notes (Signed)
Subjective:    Patient ID: Antonio [name], male    DOB: 11-28-1945, 77 y.o.   MRN: [id number]  Patient Care Team: Duanne Limerick, MD as PCP - General (Family Medicine) Lanier Prude, MD as PCP - Electrophysiology (Cardiology) End, Cristal Deer, MD as PCP - Cardiology (Cardiology) Pyrtle, Carie Caddy, MD as Consulting Physician (Gastroenterology) Vanna Scotland, MD as Consulting Physician (Urology) Salena Saner, MD as Consulting Physician (Pulmonary Disease) Kemper Durie, RN as [facility] Management  Chief Complaint  Patient presents with   Acute Visit    Dry cough. Drainage is yellow. No wheezing. DOE.    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  Today is an ACUTE visit.  Two weeks ago he developed issues with cough productive of clear to yellow sputum, head congestion and increased shortness of breath.  He called the office on 20 September and was treated with Medrol Dosepak and azithromycin and instructed to call if no better.  Today he called because of persistent dry cough, nasal drainage and cough productive of yellow to green sputum.  No wheezing.  Shortness of breath is at baseline.  He is compliant with oxygen nocturnally.  Oxygen saturations on room air have been ranging between 92 to 96%.  Cough keeps him awake at night. He has not had any fevers, chills or sweats.  No GI symptoms.  No chest pain, no orthopnea or paroxysmal nocturnal dyspnea.  No lower extremity edema.  Has not noticed any wheezing.  No hemoptysis.  COVID test at home has been negative.   DATA 06/09/2014 pulmonary function test> ratio 51%, FEV1 1.47 L (40% predicted), total lung capacity 5.43 L (71% predicted). DLCO 16.26 (44% predicted) December 2017 lung function testing showed ratio 50%, FEV1 1.47 L 43% predicted, FVC 2.96 L 67% predicted March 2020 pulmonary function testing ratio 38%,  FEV1 0.97 L 31% predicted, total lung capacity 8.44 L 119% predicted, residual volume 198% predicted, DLCO 14.0 55% predicted March 2020 high-resolution CT scan of the chest images : Centrilobular emphysema, no interstitial lung disease, aortic atherosclerosis 04/20/2018-eosinophils relative 7.3, eosinophils absolute 0.6 04/20/2018-IgE-67 01/20/2019-SARS-CoV-2-detected 03/06/2021 alpha 1 antitrypsin: Phenotype MM, level 165 03/11/2021 echocardiogram: LVEF 55%, right ventricular function normal.  No wall motion abnormalities. 04/03/2021 PFT: FEV1 1.09 L or 36% predicted, FVC 2.60 L or 61% predicted, FEV1/FVC 42%, no bronchodilator response. Lung volumes showed moderate air trapping.  Diffusion capacity severely decreased at 49%. 05/22/2022 chest x-ray 1 view: Patchy airspace disease in the medial right base concerning for pneumonia.  Review of Systems A 10 point review of systems was performed and it is as noted above otherwise negative.   Patient Active Problem List   Diagnosis Date Noted   Demand ischemia (HCC) 05/24/2022   Pure hypercholesterolemia 05/24/2022   Primary hypertension 05/24/2022   Atrial fibrillation with rapid ventricular response (HCC) 05/22/2022   Sepsis due to pneumonia (HCC) 05/22/2022   CAP (community acquired pneumonia) 05/22/2022   Nocturnal hypoxemia 05/22/2022   BPH (benign prostatic hyperplasia) 05/22/2022   History of urinary retention 05/22/2022   Coronary artery disease involving native coronary artery of native heart without angina pectoris 03/21/2022   Precordial pain 02/19/2022   Atypical chest pain 12/21/2021   DOE (dyspnea on exertion) 01/24/2021   Secondary hypercoagulable state (HCC) 12/19/2020   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   PAD ss/p SFA stent(peripheral artery disease) 11/08/2019   Paroxysmal atrial  fibrillation (HCC) 09/02/2019   Stiffness of finger joint of left hand 04/06/2019   History of COVID-19 01/20/2019   Severe persistent asthma  without complication 01/20/2019   Medication management 09/20/2018   Stage 3 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 (HCC) 09/20/2018   Increased eosinophils in the blood 06/21/2018   Diffusion capacity of lung (dl), decreased 16/11/9602   COPD with acute exacerbation (HCC) 06/22/2017   Atrial flutter, paroxysmal (HCC) 10/17/2016   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/2016   SVT (supraventricular tachycardia) (HCC) 10/17/2016   Ulcerative colitis (HCC) 05/28/2016   Cervical stenosis of spinal canal 05/05/2016   Carotid stenosis 05/02/2016   Atherosclerosis of native arteries of extremity with intermittent claudication (HCC) 05/02/2016   Cramping of hands 01/10/2016   Bilateral carpal tunnel syndrome 12/06/2015   Centrilobular Emphysema 04/25/2014   Former smoker 04/25/2014   Tobacco abuse 04/25/2014    Social History   Tobacco Use   Smoking status: Former    Current packs/day: 0.00    Average packs/day: 0.5 packs/day for 35.0 years (17.5 ttl pk-yrs)    Types: Cigarettes, Cigars    Start date: 06/10/1976    Quit date: 06/11/2011    Years since quitting: 11.4   Smokeless tobacco: Never   Tobacco comments:    Former smoker 12/19/2020  Substance Use Topics   Alcohol use: Yes    Alcohol/week: 6.0 standard drinks of alcohol    Types: 6 Shots of liquor per week    Comment: wine, liquor, or beer    Allergies  Allergen Reactions   Penicillins Rash    Has patient had a PCN reaction causing immediate rash, facial/tongue/throat swelling, SOB or lightheadedness with hypotension:Yes Has patient had a PCN reaction causing severe rash involving mucus membranes or skin necrosis:Yes Has patient had a PCN reaction that required hospitalization:No Has patient had a PCN reaction occurring within the last 10 years:No If all of the above answers are "NO", then may proceed with Cephalosporin use.     Current Meds  Medication Sig   albuterol (PROVENTIL) (2.5 MG/3ML) 0.083% nebulizer solution Take 3 mLs (2.5 mg  total) by nebulization every 6 (six) hours as needed for wheezing or shortness of breath.   albuterol (VENTOLIN HFA) 108 (90 Base) MCG/ACT inhaler Inhale 2 puffs into the lungs every 6 (six) hours as needed for wheezing or shortness of breath.   Azelastine HCl 137 MCG/SPRAY SOLN PLACE 1 SPRAY INTO BOTH NOSTRILS TWICE DAILY (Patient taking differently: Place 1 spray into both nostrils 2 (two) times daily.)   diltiazem (CARDIZEM CD) 120 MG 24 hr capsule TAKE 1 CAPSULE BY MOUTH EVERY DAY   dupilumab (DUPIXENT) 300 MG/2ML prefilled syringe Inject 300 mg into the skin every 14 (fourteen) days.   famotidine (PEPCID) 20 MG tablet Take 1 tablet (20 mg total) by mouth 2 (two) times daily as needed for heartburn or indigestion.   flecainide (TAMBOCOR) 50 MG tablet Take 1.5 tablets (75 mg total) by mouth 2 (two) times daily. PLEASE CALL OFFICE TO SCHEDULE APPOINTMENT PRIOR TO NEXT REFILL   furosemide (LASIX) 20 MG tablet TAKE 1 TABLET BY MOUTH DAILY AS NEEDED (FOR SWELLING OR WEIGHT GAIN OF 3 LBS OVERNIGHT OR 5 LBS IN A WEEK). (Patient taking differently: Take 20 mg by mouth daily as needed for fluid or edema.)   hydrocortisone (ANUSOL-HC) 25 MG suppository Place 1 suppository (25 mg total) rectally 2 (two) times daily.   ipratropium (ATROVENT) 0.02 % nebulizer solution Take 2.5 mLs (0.5 mg total) by nebulization  4 (four) times daily.   mesalamine (LIALDA) 1.2 g EC tablet TAKE 4 TABLETS BY MOUTH DAILY WITH BREAKFAST (Patient taking differently: Take 4.8 g by mouth daily with breakfast. TAKE 4 TABLETS BY MOUTH DAILY WITH BREAKFAST)   methylPREDNISolone (MEDROL DOSEPAK) 4 MG TBPK tablet As directed   mometasone-formoterol (DULERA) 200-5 MCG/ACT AERO Inhale 2 puffs into the lungs 2 (two) times daily. Rinse mouth well after use.   mupirocin ointment (BACTROBAN) 2 % Apply 1 Application topically 2 (two) times daily.   rivaroxaban (XARELTO) 20 MG TABS tablet Take 1 tablet (20 mg total) by mouth daily with supper.    rosuvastatin (CRESTOR) 10 MG tablet TAKE 1 TABLET BY MOUTH DAILY   tamsulosin (FLOMAX) 0.4 MG CAPS capsule Take 1 capsule (0.4 mg total) by mouth daily.    Immunization History  Administered Date(s) Administered   Fluad Quad(high Dose 65+) 11/01/2020, 10/29/2021   Fluad Trivalent(High Dose 65+) 10/16/2022   Influenza Inj Mdck Quad Pf 10/28/2017   Influenza Split 11/24/2013   Influenza, High Dose Seasonal PF 11/05/2016, 10/29/2017, 10/06/2018, 11/03/2019   Influenza,inj,Quad PF,6+ Mos 11/08/2014, 11/26/2015   Influenza-Unspecified 10/11/2013, 10/11/2013   PFIZER(Purple Top)SARS-COV-2 Vaccination 05/06/2019, 05/31/2019, 01/27/2020   PNEUMOCOCCAL CONJUGATE-20 06/03/2022   Pneumococcal Conjugate-13 04/18/2014   Pneumococcal Polysaccharide-23 11/26/2015   Respiratory Syncytial Virus Vaccine,Recomb Aduvanted(Arexvy) 10/14/2022   Tdap 02/13/2011   Zoster Recombinant(Shingrix) 12/01/2016, 03/02/2017   Zoster, Live 11/07/2010        Objective:     BP 118/70 (BP Location: Right Arm, Cuff Size: Normal)   Pulse 85   Temp 98.4 F (36.9 C)   Ht 5\' 8"  (1.727 m)   Wt 133 lb 12.8 oz (60.7 kg)   SpO2 97%   BMI 20.34 kg/m   SpO2: 97 % O2 Device: None (Room air)  GENERAL: Thin well-developed gentleman, no acute distress.  Well-groomed, fully ambulatory without assistance.  No conversational dyspnea. HEAD: Normocephalic, atraumatic.  EYES: Pupils equal, round, reactive to light.  No scleral icterus.  MOUTH: Dentition intact.  Oral mucosa moist.  No thrush. NECK: Supple. No thyromegaly. Trachea midline. No JVD.  No adenopathy. PULMONARY: Distant breath sounds bilaterally.  Coarse with rare end expiratory wheezes.Marland Kitchen CARDIOVASCULAR: S1 and S2.  Regular rate and rhythm.  No rubs, murmurs or gallops heard. ABDOMEN: Benign. MUSCULOSKELETAL: No joint deformity, no clubbing, no edema.  Muscle tone improved.   NEUROLOGIC: No overt focal deficit, no gait disturbance, speech is fluent. SKIN:  Intact,warm,dry. PSYCH: Mood and behavior normal.     Assessment & Plan:     ICD-10-CM   1. COPD with acute exacerbation (HCC)  J44.1 DG Chest 2 View   Will obtain chest x-ray today Patient is prone to pneumonia Will treat with Medrol Dosepak and cefdinir    2. Asthma-COPD overlap syndrome (HCC)  J44.89    Continue Dulera 200, 2 puffs twice a day Continue as needed albuterol    3. COPD with acute lower respiratory infection (HCC)  J44.0 DG Chest 2 View   Cefnidir 300 mg twice daily x 7 days    4. Nocturnal hypoxemia due to emphysema [facility])  J43.9    G47.36    Patient compliant with therapy Continue oxygen supplementation of 2 L/min    5. Paroxysmal atrial fibrillation (HCC)  I48.0    This issue adds complexity to his management Follows with cardiology On Eliquis      Orders Placed This Encounter  Procedures   DG Chest 2 View    Standing  Status:   Future    Number of Occurrences:   1    Standing Expiration Date:   11/12/2023    Order Specific Question:   Reason for Exam (SYMPTOM  OR DIAGNOSIS REQUIRED)    Answer:   rule out pneumonia    Order Specific Question:   Preferred imaging location?    Answer:   [facility]   Meds ordered this encounter  Medications   cefdinir (OMNICEF) 300 MG capsule    Sig: Take 1 capsule (300 mg total) by mouth 2 (two) times daily for 7 days.    Dispense:  14 capsule    Refill:  0   methylPREDNISolone (MEDROL DOSEPAK) 4 MG TBPK tablet    Sig: As directed    Dispense:  21 each    Refill:  0   guaiFENesin-codeine (ROBITUSSIN AC) 100-10 MG/5ML syrup    Sig: Take 5 mLs by mouth 3 (three) times daily as needed for cough.    Dispense:  120 mL    Refill:  0   Will see the patient in follow-up in 2 weeks time he is to contact us prior to that time should any new difficulties arise.  He did receive a prescription for Robitussin AC due to his persistent cough.   Gailen Shelter, MD Advanced Bronchoscopy PCCM [place]  [facility]    *This note was dictated using voice recognition software/Dragon.  Despite best efforts to proofread, errors can occur which can change the meaning. Any transcriptional errors that result from this process are unintentional and may not be fully corrected at the time of dictation.

## 2022-11-12 NOTE — Telephone Encounter (Addendum)
Please refer to 10/31/2022 mychart- zpak and medrol was prescribed.   Spoke to patient's spouse, Cindy(DPR). She stated that patient completed course of abx and prednisone with only mild improvement in sx.  C/o chest/head congestion, nasal drainage and prod cough with white sputum.  SOB is baseline. Denies f/c/s or additional sx.  He is using Dulera BID and albuterol solution 5-6x daily. Neg covid test 2 weeks ago. He wears 2L at bedtime and PRN. Spo2 maintaining between 92-98% on roomair/  Dr. Jayme Cloud, please advise. Thanks

## 2022-11-19 ENCOUNTER — Other Ambulatory Visit: Payer: Self-pay | Admitting: Cardiology

## 2022-11-19 DIAGNOSIS — I480 Paroxysmal atrial fibrillation: Secondary | ICD-10-CM

## 2022-11-20 ENCOUNTER — Telehealth: Payer: Self-pay | Admitting: Pharmacy Technician

## 2022-11-20 ENCOUNTER — Other Ambulatory Visit (HOSPITAL_COMMUNITY): Payer: Self-pay

## 2022-11-20 ENCOUNTER — Telehealth: Payer: Self-pay | Admitting: Internal Medicine

## 2022-11-20 NOTE — Telephone Encounter (Signed)
Pharmacy Patient Advocate Encounter   Received notification from Pt Calls Messages that prior authorization for xarelto is required/requested.   Insurance verification completed.   The patient is insured through [facility] ADVANTAGE/RX ADVANCE .   Per test claim: Refill too soon. PA is not needed at this time. Medication was filled 11/20/22. Next eligible fill date is 12/13/22.

## 2022-11-20 NOTE — Telephone Encounter (Signed)
Patient has been made that the prescription should be good to pick up now.

## 2022-11-20 NOTE — Telephone Encounter (Signed)
The patient stated his pharmacy advised that he needed a PA for his Xarelto. Message has been sent to that team. He does have enough for the remainder of this week.

## 2022-11-20 NOTE — Telephone Encounter (Signed)
Prescription refill request for Xarelto received.  Indication: Afib Last office visit: 09/09/22 ([place])  Weight: 60.7kg Age:77  Scr: 0.89 (10/16/22) CrCl: 57.22ml/min  Appropriate dose. Refill sent.

## 2022-11-20 NOTE — Telephone Encounter (Signed)
Pt c/o medication issue:  1. Name of Medication:  XARELTO 20 MG TABS tablet  2. How are you currently taking this medication (dosage and times per day)?   3. Are you having a reaction (difficulty breathing--STAT)?   4. What is your medication issue?   FYI--Patient states his local [facility] will be contacting the office office for an authorization for Xarelto.

## 2022-11-20 NOTE — Telephone Encounter (Signed)
Patient has been made aware.

## 2022-11-25 ENCOUNTER — Encounter: Payer: Self-pay | Admitting: Internal Medicine

## 2022-11-25 ENCOUNTER — Ambulatory Visit: Payer: PPO | Admitting: Internal Medicine

## 2022-11-25 VITALS — BP 128/64 | HR 54 | Wt 135.0 lb

## 2022-11-25 DIAGNOSIS — K648 Other hemorrhoids: Secondary | ICD-10-CM | POA: Diagnosis not present

## 2022-11-25 DIAGNOSIS — K5120 Ulcerative (chronic) proctitis without complications: Secondary | ICD-10-CM

## 2022-11-25 MED ORDER — HYDROCORTISONE ACETATE 25 MG RE SUPP: 25.0000 mg | Freq: Two times a day (BID) | RECTAL | 1 refills | Status: DC

## 2022-11-25 NOTE — Progress Notes (Signed)
Subjective:    Patient ID: Antonio [name], male    DOB: [birth year], 77 y.o.   MRN: [id number]  HPI [name] is a 77 year old male with a history of ulcerative proctitis diagnosed in 2015, symptomatic bleeding internal hemorrhoids with prior hemorrhoidal banding x 4, history of colon polyps, GERD, dyspepsia, COPD on nocturnal oxygen, atrial fibrillation on Eliquis who is here for follow-up.  He is here today with his wife was last seen just over 2 years ago.  His lung disease has been relatively stable though he is certainly limited by his COPD.  He is wearing oxygen some with exertion but also at night.  He was admitted with pneumonia and sepsis in April 2024 and required about a 1 week hospitalization.  From a proctitis perspective he is continue 2.4 g of Lialda daily as well as Colace 100 mg daily.  With this he denies constipation.  He denies diarrhea and tenesmus.  No mucus in stool.  He does see painless red blood with wiping with nearly every bowel movement but on occasion will have more significant bleeding with blood dripping into the toilet.  He is using periodic Anusol suppository.  He does not have any pain associated with his hemorrhoidal bleeding.  He does have some prolapse which he can reduce manually.   Review of Systems As per HPI, otherwise negative  Current Medications, Allergies, Past Medical History, Past Surgical History, Family History and Social History were reviewed in [employer] record.    Objective:   Physical Exam BP 128/64   Pulse (!) 54   Wt 135 lb (61.2 kg)   BMI 20.53 kg/m  Gen: awake, alert, NAD HEENT: anicteric  Neuro: nonfocal     Latest Ref Rng & Units 10/16/2022    2:47 PM 05/27/2022    4:12 AM 05/26/2022    3:31 AM  CBC  WBC 3.4 - 10.8 x10E3/uL 8.7  10.1  8.6   Hemoglobin 13.0 - 17.7 g/dL 78.2  95.6  21.3   Hematocrit 37.5 - 51.0 % 40.7  35.6  35.1   Platelets 150 - 450 x10E3/uL 161  223  195    CMP      Component Value Date/Time   NA 140 10/16/2022 1447   NA 138 10/01/2013 2131   K 4.4 10/16/2022 1447   K 4.2 10/01/2013 2131   CL 102 10/16/2022 1447   CL 107 10/01/2013 2131   CO2 22 10/16/2022 1447   CO2 23 10/01/2013 2131   GLUCOSE 87 10/16/2022 1447   GLUCOSE 107 (H) 05/28/2022 1117   GLUCOSE 118 (H) 10/01/2013 2131   BUN 9 10/16/2022 1447   BUN 11 10/01/2013 2131   CREATININE 0.89 10/16/2022 1447   CREATININE 0.93 10/01/2013 2131   CALCIUM 9.3 10/16/2022 1447   CALCIUM 8.7 10/01/2013 2131   PROT 6.2 (L) 05/26/2022 0331   PROT 7.6 12/14/2019 0931   ALBUMIN 4.4 10/16/2022 1447   AST 24 05/26/2022 0331   ALT 15 05/26/2022 0331   ALKPHOS 91 05/26/2022 0331   BILITOT 0.7 05/26/2022 0331   BILITOT 0.8 12/14/2019 0931   GFR 82.54 05/28/2016 1450   EGFR 87 10/16/2022 1447   GFRNONAA >60 05/28/2022 1117   GFRNONAA >60 10/01/2013 2131        Assessment & Plan:  77 year old male with a history of ulcerative proctitis diagnosed in 2015, symptomatic bleeding internal hemorrhoids with prior hemorrhoidal banding x 4, history of colon polyps, GERD, dyspepsia, COPD on  nocturnal oxygen, atrial fibrillation on Eliquis who is here for follow-up.   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  We discussed hemorrhoidal banding today but he is a little reluctant given the slightly elevated risk of bleeding on Xarelto.  He is also not bleeding on a regular basis.  He would prefer to continue to watch this and monitor expectantly and consider banding if the bleeding becomes more frequent.  He is no longer anemic which argues against significant ongoing blood loss. -- He can use hydrocortisone suppository 25 mg nightly for 3-5 nights but I cautioned him on using this continuously.  I am certainly okay if he uses Preparation H suppositories in the meantime. -- Repeat banding if needed.  Appointment for December, sooner if rectal bleeding becomes more  frequent  2.  Ulcerative proctitis/left-sided colitis --no evidence of active inflammation.  Continue Lialda 2.4 g daily.  In remission as of last endoscopy.  Bleeding felt hemorrhoidal as opposed to active proctitis.  3.  Prior mild constipation --not an issue on Colace 100 mg daily.  This will exacerbate #1 if constipation becomes again problematic  30 minutes total spent today including patient facing time, coordination of care, reviewing medical history/procedures/pertinent radiology studies, and documentation of the encounter.

## 2022-11-25 NOTE — Patient Instructions (Addendum)
_______________________________________________________  If your blood pressure at your visit was 140/90 or greater, please contact your primary care physician to follow up on this. _______________________________________________________  If you are age 79 or older, your body mass index should be between 23-30. Your Body mass index is 20.53 kg/m. If this is out of the aforementioned range listed, please consider follow up with your Primary Care Provider. ________________________________________________________  The [place] [facility] providers would like to encourage you to use [facility] to communicate with providers for non-urgent requests or questions.  Due to long hold times on the telephone, sending your provider a message by [facility] may be a faster and more efficient way to get a response.  Please allow 48 business hours for a response.  Please remember that this is for non-urgent requests.  _______________________________________________________ Antonio [name] can alternate using Anusol suppositories and over-the-counter preparation H suppositories.  You are scheduled to follow up 01-15-23 at 9:10am.  Thank you for entrusting me with your care and choosing [facility].  Dr Rhea Belton

## 2022-11-27 ENCOUNTER — Ambulatory Visit: Payer: PPO | Admitting: Pulmonary Disease

## 2022-11-27 ENCOUNTER — Encounter: Payer: Self-pay | Admitting: Pulmonary Disease

## 2022-11-27 VITALS — BP 118/70 | HR 75 | Temp 97.8°F | Ht 68.0 in | Wt 134.8 lb

## 2022-11-27 DIAGNOSIS — G4736 Sleep related hypoventilation in conditions classified elsewhere: Secondary | ICD-10-CM

## 2022-11-27 DIAGNOSIS — J439 Emphysema, unspecified: Secondary | ICD-10-CM

## 2022-11-27 DIAGNOSIS — J4489 Other specified chronic obstructive pulmonary disease: Secondary | ICD-10-CM

## 2022-11-27 DIAGNOSIS — I480 Paroxysmal atrial fibrillation: Secondary | ICD-10-CM | POA: Diagnosis not present

## 2022-11-27 DIAGNOSIS — J441 Chronic obstructive pulmonary disease with (acute) exacerbation: Secondary | ICD-10-CM

## 2022-11-27 LAB — NITRIC OXIDE: Nitric Oxide: 12

## 2022-11-27 NOTE — Patient Instructions (Signed)
VISIT SUMMARY:  During your visit, we discussed your ongoing cold symptoms and your history of asthma and COPD. We noted that your asthma is under control, but your COPD may be causing your current respiratory issues. We also discussed your current medications.  YOUR PLAN:  -COPD EXACERBATION: COPD, or Chronic Obstructive Pulmonary Disease, is a chronic lung disease that makes it hard to breathe. Your COPD seems to be causing your current symptoms. To help manage this, we will start you on Mucinex  Extra Strength to help loosen mucus, and order an Acapella device to assist with mucus clearance. We will also start you on Ohtuvayre via a nebulizer twice daily. If the cost is prohibitive, please request a patient assistance program.  -ASTHMA: Your asthma, a condition that causes your airways to become inflamed and narrow, is currently under control. Please continue with your current management plan.  -GENERAL HEALTH MAINTENANCE: For your overall health, it's important to ensure you're staying adequately hydrated.  INSTRUCTIONS:  Please start taking Mucinex and use the Acapella device once it arrives. Begin using Ohtuvayre  via a nebulizer twice daily. If you have any issues with the cost of these treatments, please request a patient assistance program. Continue with your current asthma management plan, and remember to stay hydrated. We will see you in follow up in 4-6 weeks time.

## 2022-11-27 NOTE — Progress Notes (Signed)
Subjective:    Patient ID: Antonio [name], male    DOB: 1945/06/15, 77 y.o.   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  He was last seen here 12 November 2022 for an ACUTE visit.  He had COPD with acute exacerbation at that time treated with Medrol Dosepak and cefdinir.  Chest x-ray performed on 2 October did not show infiltrate.  Patient has severe COPD changes.  HPI Discussed the use of AI scribe software for clinical note transcription with the patient, who gave verbal consent to proceed.  History of Present Illness   The patient presents with persistent symptoms of difficult to bring up secretions, including audible rhonchorous breathing. He reports not taking Mucinex, a medication suggested to alleviate his symptoms. He also mentions not having an Acapella device, for mucociliary clearance.    The patient has a history of severe persistent asthma and severe COPD overlap. He reports using oxygen at night, provided by an oxygen concentrator he received from a family member.  He has been prescribed 2 L/min nocturnally previously, he is compliant with the oxygen and notes benefit of the therapy.  He had declined DME company due to cost.  Despite these chronic conditions, the patient's recent chest x-ray showed no signs of pneumonia.  He is currently on a patient assistance program  for Dupixent and Lialda, medications likely related to his chronic conditions.  The patient's symptoms and history suggest a slow to improve exacerbation of his COPD, which may be contributing to his current respiratory symptoms. The patient's asthma appears to be under control, as he reports no wheezing. However, the patient's COPD seems to be causing the most trouble, with symptoms taking longer to clear.  The patient's treatment regimen does not include a class of medications known as LAMAs due to a previous issue with urine retention.  He is currently on LABA/ICS combination and as needed albuterol.  We are recommending addition of Ohtuvayre administered via a nebulizer should help with his COPD component and not cause urinary retention.     DATA 06/09/2014 pulmonary function test> ratio 51%, FEV1 1.47 L (40% predicted), total lung capacity 5.43 L (71% predicted). DLCO 16.26 (44% predicted) December 2017 lung function testing showed ratio 50%, FEV1 1.47 L 43% predicted, FVC 2.96 L 67% predicted [name] 2020 pulmonary function testing ratio 38%, FEV1 0.97 L 31% predicted, total lung capacity 8.44 L 119% predicted, residual volume 198% predicted, DLCO 14.0 55% predicted [name] 2020 high-resolution CT scan of the chest images : Centrilobular emphysema, no interstitial lung disease, aortic atherosclerosis 04/20/2018-eosinophils relative 7.3, eosinophils absolute 0.6 04/20/2018-IgE-67 01/20/2019-SARS-CoV-2-detected 03/06/2021 alpha 1 antitrypsin: Phenotype MM, level 165 03/11/2021 echocardiogram: LVEF 55%, right ventricular function normal.  No wall motion abnormalities. 04/03/2021 PFT: FEV1 1.09 L or 36% predicted, FVC 2.60 L or 61% predicted, FEV1/FVC 42%, no bronchodilator response. Lung volumes showed moderate air trapping.  Diffusion capacity severely decreased at 49%. 05/22/2022 chest x-ray 1 view: Patchy airspace disease  in the medial right base concerning for pneumonia. 11/12/2022 chest x-ray PA  and lateral: No overt infiltrate noted, changes of severe COPD noted.   Review of Systems A 10 point review of systems was performed and it is as noted above otherwise negative.   Patient Active Problem List   Diagnosis Date Noted   Demand ischemia (HCC) 05/24/2022   Pure hypercholesterolemia 05/24/2022   Primary hypertension 05/24/2022   Atrial fibrillation with rapid ventricular response (HCC) 05/22/2022   Sepsis due to pneumonia (HCC) 05/22/2022   CAP (community acquired pneumonia) 05/22/2022   Nocturnal hypoxemia 05/22/2022   BPH (benign prostatic hyperplasia) 05/22/2022   History of urinary retention 05/22/2022   Coronary artery disease involving native coronary artery of native heart without angina pectoris 03/21/2022   Precordial pain 02/19/2022   Atypical chest pain 12/21/2021   DOE (dyspnea on exertion) 01/24/2021   Secondary hypercoagulable state (HCC) 12/19/2020   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   PAD ss/p SFA stent(peripheral artery disease) 11/08/2019   Paroxysmal atrial fibrillation (HCC) 09/02/2019   Stiffness of finger joint of left hand 04/06/2019   History of COVID-19 01/20/2019   Severe persistent asthma without complication 01/20/2019   Medication management 09/20/2018   Stage 3 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 (HCC) 09/20/2018   Increased eosinophils in the blood 06/21/2018   Diffusion capacity of lung (dl), decreased 72/53/6644   COPD with acute exacerbation (HCC) 06/22/2017   Atrial flutter, paroxysmal (HCC) 10/17/2016   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/2016   SVT (supraventricular tachycardia) (HCC) 10/17/2016   Ulcerative colitis (HCC) 05/28/2016   Cervical stenosis of spinal canal 05/05/2016   Carotid stenosis 05/02/2016   Atherosclerosis of native arteries of extremity with intermittent claudication (HCC) 05/02/2016   Cramping of hands 01/10/2016   Bilateral carpal tunnel syndrome 12/06/2015   Centrilobular Emphysema 04/25/2014   Former smoker 04/25/2014    Tobacco abuse 04/25/2014    Social History   Tobacco Use   Smoking status: Former    Current packs/day: 0.00    Average packs/day: 0.5 packs/day for 35.0 years (17.5 ttl pk-yrs)    Types: Cigarettes, Cigars    Start date: 06/10/1976    Quit date: 06/11/2011    Years since quitting: 11.4   Smokeless tobacco: Never   Tobacco comments:    Former smoker 12/19/2020  Substance Use Topics   Alcohol use: Yes    Alcohol/week: 6.0 standard drinks of alcohol    Types: 6 Shots of liquor per week    Comment: wine, liquor, or beer    Allergies  Allergen Reactions   Penicillins Rash    Has patient had a PCN reaction causing immediate rash, facial/tongue/throat swelling, SOB or lightheadedness with hypotension:Yes Has patient had a PCN reaction causing severe rash involving mucus membranes or skin necrosis:Yes Has patient had a PCN reaction that required hospitalization:No Has patient had a PCN reaction occurring within the last 10 years:No If all of the above answers are "NO", then may proceed with Cephalosporin use.     No outpatient medications have been marked as taking for the 11/27/22 encounter (Office Visit) with Salena Saner, MD.    Immunization History  Administered Date(s) Administered   Fluad Quad(high Dose 65+) 11/01/2020, 10/29/2021   Fluad Trivalent(High Dose 65+) 10/16/2022   Influenza Inj Mdck Quad Pf 10/28/2017   Influenza Split 11/24/2013   Influenza, High Dose Seasonal PF 11/05/2016, 10/29/2017, 10/06/2018, 11/03/2019   Influenza,inj,Quad PF,6+ Mos 11/08/2014, 11/26/2015   Influenza-Unspecified 10/11/2013, 10/11/2013   PFIZER(Purple Top)SARS-COV-2 Vaccination  05/06/2019, 05/31/2019, 01/27/2020   PNEUMOCOCCAL CONJUGATE-20 06/03/2022   Pneumococcal Conjugate-13 04/18/2014   Pneumococcal Polysaccharide-23 11/26/2015   Respiratory Syncytial Virus Vaccine,Recomb Aduvanted(Arexvy) 10/14/2022   Tdap 02/13/2011   Zoster Recombinant(Shingrix) 12/01/2016, 03/02/2017    Zoster, Live 11/07/2010      Objective:     BP 118/70 (BP Location: Right Arm, Cuff Size: Normal)   Pulse 75   Temp 97.8 F (36.6 C)   Ht 5\' 8"  (1.727 m)   Wt 134 lb 12.8 oz (61.1 kg)   SpO2 97%   BMI 20.50 kg/m   SpO2: 97 % O2 Device: None (Room air)  GENERAL: Thin well-developed gentleman, no acute distress.  Well-groomed, fully ambulatory without assistance.  No conversational dyspnea. HEAD: Normocephalic, atraumatic.  EYES: Pupils equal, round, reactive to light.  No scleral icterus.  MOUTH: Dentition intact.  Oral mucosa moist.  No thrush. NECK: Supple. No thyromegaly. Trachea midline. No JVD.  No adenopathy. PULMONARY: Distant breath sounds bilaterally.  Coarse with chyme noted particularly in the upper lung zones. CARDIOVASCULAR: S1 and S2.  Regular rate and rhythm.  No rubs, murmurs or gallops heard. ABDOMEN: Benign. MUSCULOSKELETAL: No joint deformity, no clubbing, no edema.  Muscle tone improved.   NEUROLOGIC: No overt focal deficit, no gait disturbance, speech is fluent. SKIN: Intact,warm,dry. PSYCH: Mood and behavior normal.    Lab Results  Component Value Date   NITRICOXIDE 12 11/27/2022    Assessment & Plan:     ICD-10-CM   1. COPD with acute exacerbation (HCC)  J44.1 AMB REFERRAL FOR DME    2. Asthma-COPD overlap syndrome (HCC)  J44.89 AMB REFERRAL FOR DME    Nitric oxide    3. Nocturnal hypoxemia due to emphysema (HCC)  J43.9    G47.36    Compliant with oxygen at 2 L/min    4. Paroxysmal atrial fibrillation (HCC)  I48.0      Orders Placed This Encounter  Procedures   AMB REFERRAL FOR DME    Referral Priority:   Routine    Referral Type:   Durable Medical Equipment Purchase    Number of Visits Requested:   1   Nitric oxide   Assessment and Plan    COPD exacerbation, slow to resolve patient with known stage IV COPD Persistent cough and chest congestion. No current use of Mucinex or Acapella device. Recent chest X-ray showed no  pneumonia. -Start Mucinex extra strength to help loosen mucus. -Order Acapella device to assist with mucus clearance. -Start Ohtuvayre via nebulizer twice daily. Patient to request patient assistance program if cost is prohibitive. -Nitric oxide today did not indicate type II inflammation in the airway.  Asthma Controlled with no current wheezing. -Continue current management. -Nitric oxide 12 ppb, low  General Health Maintenance -Ensure adequate hydration.  Follow up in 4-6 weeks time.  Call sooner should any new difficulties arise.     Antonio Shelter, MD Advanced Bronchoscopy PCCM [facility]    *This note was dictated using voice recognition software/Dragon.  Despite best efforts to proofread, errors can occur which can change the meaning. Any transcriptional errors that result from this process are unintentional and may not be fully corrected at the time of dictation.

## 2022-12-01 ENCOUNTER — Telehealth: Payer: Self-pay | Admitting: Pharmacist

## 2022-12-01 NOTE — Telephone Encounter (Signed)
Received new start paperwork for [facility]. Faxed to [employer] with clinicals and insurance card copy  Fax: [phone] Phone: [phone]  Chesley Mires, PharmD, MPH, BCPS, CPP Clinical Pharmacist (Rheumatology and Pulmonology)

## 2022-12-05 ENCOUNTER — Encounter: Payer: Self-pay | Admitting: Pulmonary Disease

## 2022-12-05 NOTE — Telephone Encounter (Signed)
Received fax from [employer] that patient has been successfully enrolled into pathway program.  Rx will be triaged to [facility]. The specialty pharmacy will contact payer for benefits.  Patient ID: [id number]  Chesley Mires, PharmD, MPH, BCPS, CPP Clinical Pharmacist (Rheumatology and Pulmonology)

## 2022-12-11 ENCOUNTER — Ambulatory Visit: Payer: PPO | Admitting: Physician Assistant

## 2022-12-16 NOTE — Telephone Encounter (Signed)
Submitted a Prior Authorization request to [facility] ADVANTAGE/RX ADVANCE for [facility] via CoverMyMeds. Will update once we receive a response.  Key: [id number]

## 2022-12-16 NOTE — Telephone Encounter (Signed)
Reached out to [employer] [phone]) and the rep informed me that Sharyn Blitz has not yet been processed as it requires a PA. They have submitted the request in Cover My Meds (Key: [id number]). Will continue to follow.  Sofie Rower, PharmD [facility] PGY-1

## 2022-12-17 NOTE — Telephone Encounter (Signed)
Received fax from [employer] requesting clarification on patient's site of residence. Completed that patient lives at home and faxed back to [employer]  Phone: [phone] Request ID: [id number] Fax: [phone]  Chesley Mires, PharmD, MPH, BCPS, CPP Clinical Pharmacist (Rheumatology and Pulmonology)

## 2022-12-17 NOTE — Telephone Encounter (Signed)
Received notification from [facility] ADVANTAGE/RX ADVANCE regarding a prior authorization for [facility]. Authorization has been APPROVED from 12/17/2022 to 02/10/2024.  Authorization # [id number]  Chesley Mires, PharmD, MPH, BCPS, CPP Clinical Pharmacist (Rheumatology and Pulmonology)

## 2022-12-23 ENCOUNTER — Encounter: Payer: Self-pay | Admitting: Cardiology

## 2022-12-23 ENCOUNTER — Ambulatory Visit: Payer: PPO | Attending: Cardiology | Admitting: Cardiology

## 2022-12-23 VITALS — BP 119/63 | HR 61 | Ht 71.0 in | Wt 132.0 lb

## 2022-12-23 DIAGNOSIS — J449 Chronic obstructive pulmonary disease, unspecified: Secondary | ICD-10-CM | POA: Diagnosis not present

## 2022-12-23 DIAGNOSIS — I2510 Atherosclerotic heart disease of native coronary artery without angina pectoris: Secondary | ICD-10-CM | POA: Diagnosis not present

## 2022-12-23 DIAGNOSIS — I480 Paroxysmal atrial fibrillation: Secondary | ICD-10-CM | POA: Diagnosis not present

## 2022-12-23 DIAGNOSIS — I10 Essential (primary) hypertension: Secondary | ICD-10-CM | POA: Diagnosis not present

## 2022-12-23 DIAGNOSIS — I739 Peripheral vascular disease, unspecified: Secondary | ICD-10-CM

## 2022-12-23 DIAGNOSIS — E782 Mixed hyperlipidemia: Secondary | ICD-10-CM

## 2022-12-23 NOTE — Progress Notes (Addendum)
Cardiology Office Note:  .   Date:  12/23/2022  ID:  Antonio [name], DOB [birth year], MRN [id number] PCP: Antonio Limerick, MD  [facility] Providers Cardiologist:  Antonio Kendall, MD Electrophysiologist:  Antonio Prude, MD    History of Present Illness: .   Antonio [name] is a 77 y.o. male with past medical history of paroxysmal atrial fibrillation/flutter on rivaroxaban, nonobstructive coronary artery disease, severe COPD, PSVT, PAD status post left SFA stenting, hypertension, hyperlipidemia, ulcerative colitis, BPH, who returns today for follow-up.   Echocardiogram completed January 2020 showed normal LV function.  Stress test in May 2023 in the setting of chest tightness revealed normal LV function without ischemia or infarct.  Coronary calcium was noted on scans.  In January 2021 for this any recurrent chest discomfort decision was made to pursue an coronary CTA which showed mild nonobstructive CAD.  Paroxysmal atrial fibrillation/atrial flutter was continued to be followed by EP.  It was felt he was not a candidate for advanced procedures secondary to severe lung disease.  He was continued to be managed on flecainide and rivaroxaban.  Earlier in the year his metoprolol dosing was reduced secondary to baseline bradycardia with limited dosing adjustments.  On 05/19/22 with symptoms of mild dyspnea, malaise, pleuritic back pain which progressed throughout the week became associated with chest tightness and worsening dyspnea.  On 05/22/2022 presenting [facility], [facility] emergency department send to be in atrial fibrillation with RVR with rates of 170s to 180s.  He had a fever 102.5, white count of 13,000, lactic acid elevated 2.7.  High-sensitivity troponin was mildly elevated with a peak of 34.  He was placed on IV diltiazem as well as antibiotics for management of pneumonia and sepsis and was admitted for continued evaluation.  He converted to sinus rhythm and was maintained on IV diltiazem.   Home doses of flecainide and metoprolol were continued.  He was continued on rate control with diltiazem and flecainide and metoprolol was discontinued.  He was considered stable and subsequently discharged on 05/28/2022.   He was last seen in clinic 09/09/2022.  Overall from a cardiac perspective he was doing fairly well.  There were no changes made to his current medication regimen and no further testing that was ordered at that time.  He returns to clinic today accompanied by his wife.  Overall he has been doing fairly well.  He continues to follow with pulmonary for his COPD and recently had medication changes that were made.  He states that he did have 1 episode of swelling to his lower extremities and some associated shortness of breath which prompted to take an as needed fluid pill.  Since that time he states that he has been improved.  He denies any chest pain, palpitations.  States that he has been compliant with his current medications.  Denies any bleeding or blood noted in his urine or stool.  States that he has not no missed doses of rivaroxaban.  Denies any hospitalizations or visits to the emergency department.  ROS: 10 point review of systems has been reviewed and considered negative with exception of what is been listed in the HPI  Studies Reviewed: Marland Kitchen   EKG Interpretation Date/Time:  Tuesday December 23 2022 10:46:47 EST Ventricular Rate:  81 PR Interval:  180 QRS Duration:  76 QT Interval:  354 QTC Calculation: 411 R Axis:   106  Text Interpretation: Sinus rhythm with Premature atrial complexes Rightward axis Pulmonary disease pattern When compared with  ECG of 09-Sep-2022 10:06, Confirmed by Antonio [name] ([id number]) on 12/23/2022 11:09:05 AM    Coronary CTA 03/06/22 IMPRESSION: 1. Coronary calcium score of 251. This was 42nd percentile for age and sex matched control.   2. Normal coronary origin with left dominance.   3. Mild distal LM and proximal LAD stenosis (25-49%)   4.  CAD-RADS 2. Mild non-obstructive CAD (25-49%). Consider non-atherosclerotic causes of chest pain. Consider preventive therapy and risk factor modification.   TTE 03/11/21 1. Left ventricular ejection fraction, by estimation, is 55%. Left  ventricular ejection fraction by 2D MOD biplane is 54.2 %. The left  ventricle has normal function. The left ventricle has no regional wall  motion abnormalities. Left ventricular  diastolic parameters were normal.   2. Right ventricular systolic function is normal. The right ventricular  size is normal.   3. The mitral valve is normal in structure. No evidence of mitral valve  regurgitation.   4. The aortic valve is tricuspid. Aortic valve regurgitation is not  visualized.   5. The inferior vena cava is normal in size with greater than 50%  respiratory variability, suggesting right atrial pressure of 3 mmHg.  Risk Assessment/Calculations:    CHA2DS2-VASc Score = 4   This indicates a 4.8% annual risk of stroke. The patient's score is based upon: CHF History: 0 HTN History: 1 Diabetes History: 0 Stroke History: 0 Vascular Disease History: 1 Age Score: 2 Gender Score: 0            Physical Exam:   VS:  BP 119/63 (BP Location: Left Arm, Patient Position: Sitting, Cuff Size: Normal)   Pulse 61   Ht 5\' 11"  (1.803 m)   Wt 132 lb (59.9 kg)   SpO2 94%   BMI 18.41 kg/m    Wt Readings from Last 3 Encounters:  12/23/22 132 lb (59.9 kg)  11/27/22 134 lb 12.8 oz (61.1 kg)  11/25/22 135 lb (61.2 kg)    GEN: Well nourished, well developed in no acute distress NECK: No JVD; No carotid bruits CARDIAC: RRR, no murmurs, rubs, gallops RESPIRATORY:  Clear to auscultation without rales, wheezing or rhonchi  ABDOMEN: Soft, non-tender, non-distended EXTREMITIES: Trace pretibial edema; No deformity   ASSESSMENT AND PLAN: .   Paroxysmal atrial fibrillation/atrial flutter that is sinus today on EKG.  He is continued on rivaroxaban 20 mg daily for  CHA2DS2-VASc of at least 4 for stroke prophylaxis.  He is continued on diltiazem 120 mg daily, flecainide 50 mg twice daily, metoprolol titrate 25 mg as needed.  He also continues to follow with EP.  Nonobstructive coronary artery disease without any chest discomfort or anginal equivalent.  EKG reveals sinus with PACs.  No ischemic changes noted.  Coronary CTA in January 2021 showed mild nonobstructive CAD.  He is continued on rivaroxaban and lieu of aspirin and rosuvastatin 10 mg daily.  He also has Nitrostat as needed but has not required the use of any of this medication.  Primary hypertension with a blood pressure today 119/63.  Blood pressures have remained stable.  He is continued on diltiazem and as needed metoprolol.  He is encouraged to continue to monitor his pressures 1 to 2 hours postmedication administration at home as well.  Mixed hyperlipidemia with an LDL of 57.  Continues on rosuvastatin 10 mg daily.  Peripheral arterial disease with occasional claudication.  He is continued on statin therapy and rivaroxaban and lieu of aspirin.  This is an ongoing management per vascular surgery.  Severe COPD.  Continues to follow with Dr. Jayme Cloud as an outpatient.  His inhalers have not been changed and he is continued on oxygen at 2 L for nocturnal hypoxia and emphysema.       Dispo: Patient return to clinic to see MD/APP in 3 months or sooner if needed for reevaluation of symptoms.  Patient also requested patient assistance form assistance thank you due to cost of rivaroxaban.  Patient will follow-up with nursing staff with the appropriate information needed to have this form submitted in a timely fashion.  Signed, Jaquin Coy, NP

## 2022-12-23 NOTE — Patient Instructions (Signed)
Medication Instructions:  Your physician recommends that you continue on your current medications as directed. Please refer to the Current Medication list given to you today.  *If you need a refill on your cardiac medications before your next appointment, please call your pharmacy*  Lab Work: - None ordered  Testing/Procedures: - None ordered  Follow-Up: At [facility], you and your health needs are our priority.  As part of our continuing mission to provide you with exceptional heart care, we have created designated Provider Care Teams.  These Care Teams include your primary Cardiologist (physician) and Advanced Practice Providers (APPs -  Physician Assistants and Nurse Practitioners) who all work together to provide you with the care you need, when you need it.  Your next appointment:   3 month(s)  Provider:   Yvonne Kendall, MD or Charlsie Quest, NP    Other Instructions - Patient assistance for Xarelto

## 2022-12-29 ENCOUNTER — Telehealth: Payer: Self-pay | Admitting: Internal Medicine

## 2022-12-29 NOTE — Telephone Encounter (Signed)
Pt spouse dropped off documentation for pt assistance forms place on Bernette Mayers RN desk

## 2022-12-29 NOTE — Telephone Encounter (Signed)
Spoke with patient and reviewed that we also need page 2 of the 1040. She verbalized understanding and will be back over this way later today. Advised that I will also give her a sheet that can offer local number to call for assistance. She verbalized understanding with no further questions at this time.

## 2022-12-30 NOTE — Telephone Encounter (Signed)
Application was updated completed and faxed to company

## 2023-01-06 ENCOUNTER — Ambulatory Visit: Payer: PPO | Admitting: Pulmonary Disease

## 2023-01-06 ENCOUNTER — Encounter: Payer: Self-pay | Admitting: Pulmonary Disease

## 2023-01-06 ENCOUNTER — Telehealth: Payer: Self-pay

## 2023-01-06 VITALS — BP 116/68 | HR 83 | Temp 97.8°F | Ht 70.0 in | Wt 134.8 lb

## 2023-01-06 DIAGNOSIS — J300 Vasomotor rhinitis: Secondary | ICD-10-CM

## 2023-01-06 DIAGNOSIS — J439 Emphysema, unspecified: Secondary | ICD-10-CM

## 2023-01-06 DIAGNOSIS — R0600 Dyspnea, unspecified: Secondary | ICD-10-CM

## 2023-01-06 DIAGNOSIS — J449 Chronic obstructive pulmonary disease, unspecified: Secondary | ICD-10-CM | POA: Diagnosis not present

## 2023-01-06 DIAGNOSIS — J4550 Severe persistent asthma, uncomplicated: Secondary | ICD-10-CM

## 2023-01-06 DIAGNOSIS — G4736 Sleep related hypoventilation in conditions classified elsewhere: Secondary | ICD-10-CM

## 2023-01-06 MED ORDER — AZELASTINE HCL 137 MCG/SPRAY NA SOLN: 1.0000 | Freq: Two times a day (BID) | NASAL | 6 refills | Status: DC

## 2023-01-06 MED ORDER — DUPIXENT 300 MG/2ML ~~LOC~~ SOSY
300.0000 mg | PREFILLED_SYRINGE | SUBCUTANEOUS | 1 refills | Status: DC
Start: 2023-01-06 — End: 2023-04-27

## 2023-01-06 NOTE — Progress Notes (Signed)
Subjective:    Patient ID: Antonio [name], male    DOB: 1946-01-03, 77 y.o.   MRN: [id number]  Patient Care Team: Duanne Limerick, MD as PCP - General (Family Medicine) Lanier Prude, MD as PCP - Electrophysiology (Cardiology) End, Cristal Deer, MD as PCP - Cardiology (Cardiology) Pyrtle, Carie Caddy, MD as Consulting Physician (Gastroenterology) Vanna Scotland, MD as Consulting Physician (Urology) Salena Saner, MD as Consulting Physician (Pulmonary Disease) Rodney Langton, RN as [facility] Management  Chief Complaint  Patient presents with   Follow-up    DOE. No wheezing or cough.     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  He was last seen here 27 November 2022.  Started on [place] during that visit.  HPI Discussed the use of AI scribe software for clinical note transcription with the patient, who gave verbal consent to proceed.  History of Present Illness   The patient, with a history of COPD, presents with a question about the necessity of daily Ohtuvayre nebulizer treatments.  He is planning to travel for his daughter-in-law's graduation and is concerned about the size and portability of his nebulizer.  He also reports a runny nose when climbing stairs, which suspect is related to vasomotor rhinitis.  He is currently managing his COPD with Dulera, Proventil as needed, Ohtuvayre.  He has a history of urinary retention, which led to the discontinuation of Breztri and Spiriva (LAMA).  He expresses concern about recent back pain, which he associates with a previous pneumonia diagnosis having had similar symptoms.   Overall he feels that the Sharyn Blitz is helping him.  As noted we are avoiding LAMA/SAMA's due to the urinary retention issues.  He is compliant with oxygen nocturnally for nocturnal hypoxemia.  He notes benefit of the therapy.      DATA 06/09/2014 pulmonary function test> ratio 51%, FEV1 1.47 L (40% predicted), total lung capacity 5.43 L (71% predicted). DLCO 16.26 (44% predicted) December 2017 lung function testing showed ratio 50%, FEV1 1.47 L 43% predicted, FVC 2.96 L 67% predicted [name] 2020 pulmonary function testing ratio 38%, FEV1 0.97 L 31% predicted, total lung capacity 8.44 L 119% predicted, residual volume 198% predicted, DLCO 14.0 55% predicted [name] 2020 high-resolution CT scan of the chest images : Centrilobular emphysema, no interstitial lung disease, aortic atherosclerosis 04/20/2018-eosinophils relative 7.3, eosinophils absolute 0.6 04/20/2018-IgE-67 01/20/2019-SARS-CoV-2-detected 03/06/2021 alpha 1 antitrypsin: Phenotype MM, level 165 03/11/2021 echocardiogram: LVEF 55%, right ventricular function normal.  No wall motion abnormalities. 04/03/2021 PFT: FEV1 1.09 L or 36% predicted, FVC 2.60 L or 61% predicted, FEV1/FVC 42%, no bronchodilator response. Lung volumes showed moderate air trapping.  Diffusion capacity severely decreased at 49%. 05/22/2022 chest x-ray 1 view: Patchy airspace disease in the medial right base concerning for pneumonia. 11/12/2022 chest x-ray PA and lateral: No overt infiltrate noted, changes of severe COPD noted.  Review of Systems A 10 point review of systems was performed and it is as noted above otherwise negative.   Patient Active Problem List   Diagnosis Date Noted   Demand ischemia (HCC) 05/24/2022   Pure hypercholesterolemia 05/24/2022   Primary hypertension 05/24/2022   Atrial fibrillation with rapid ventricular response (HCC) 05/22/2022   Sepsis due to pneumonia (HCC) 05/22/2022   CAP (community acquired pneumonia) 05/22/2022   Nocturnal hypoxemia 05/22/2022   BPH (benign prostatic hyperplasia) 05/22/2022   History of urinary retention 05/22/2022   Coronary artery  disease involving native coronary artery of native heart without angina pectoris 03/21/2022    Precordial pain 02/19/2022   Atypical chest pain 12/21/2021   DOE (dyspnea on exertion) 01/24/2021   Secondary hypercoagulable state (HCC) 12/19/2020   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   PAD ss/p SFA stent(peripheral artery disease) 11/08/2019   Paroxysmal atrial fibrillation (HCC) 09/02/2019   Stiffness of finger joint of left hand 04/06/2019   History of COVID-19 01/20/2019   Severe persistent asthma without complication 01/20/2019   Medication management 09/20/2018   Stage 3 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 (HCC) 09/20/2018   Increased eosinophils in the blood 06/21/2018   Diffusion capacity of lung (dl), decreased 96/05/5407   COPD with acute exacerbation (HCC) 06/22/2017   Atrial flutter, paroxysmal (HCC) 10/17/2016   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/2016   SVT (supraventricular tachycardia) (HCC) 10/17/2016   Ulcerative colitis (HCC) 05/28/2016   Cervical stenosis of spinal canal 05/05/2016   Carotid stenosis 05/02/2016   Atherosclerosis of native arteries of extremity with intermittent claudication (HCC) 05/02/2016   Cramping of hands 01/10/2016   Bilateral carpal tunnel syndrome 12/06/2015   Centrilobular Emphysema 04/25/2014   Former smoker 04/25/2014   Tobacco abuse 04/25/2014    Social History   Tobacco Use   Smoking status: Former    Current packs/day: 0.00    Average packs/day: 0.5 packs/day for 35.0 years (17.5 ttl pk-yrs)    Types: Cigarettes, Cigars    Start date: 06/10/1976    Quit date: 06/11/2011    Years since quitting: 11.5   Smokeless tobacco: Never   Tobacco comments:    Former smoker 12/19/2020  Substance Use Topics   Alcohol use: Yes    Alcohol/week: 6.0 standard drinks of alcohol    Types: 6 Shots of liquor per week    Comment: wine, liquor, or beer    Allergies  Allergen Reactions   Penicillins Rash    Has patient had a PCN reaction causing immediate rash, facial/tongue/throat swelling, SOB or lightheadedness with hypotension:Yes Has patient had a PCN  reaction causing severe rash involving mucus membranes or skin necrosis:Yes Has patient had a PCN reaction that required hospitalization:No Has patient had a PCN reaction occurring within the last 10 years:No If all of the above answers are "NO", then may proceed with Cephalosporin use.     Current Meds  Medication Sig   albuterol (PROVENTIL) (2.5 MG/3ML) 0.083% nebulizer solution Take 3 mLs (2.5 mg total) by nebulization every 6 (six) hours as needed for wheezing or shortness of breath.   albuterol (VENTOLIN HFA) 108 (90 Base) MCG/ACT inhaler Inhale 2 puffs into the lungs every 6 (six) hours as needed for wheezing or shortness of breath.   diltiazem (CARDIZEM CD) 120 MG 24 hr capsule TAKE 1 CAPSULE BY MOUTH EVERY DAY   flecainide (TAMBOCOR) 50 MG tablet Take 1.5 tablets (75 mg total) by mouth 2 (two) times daily. PLEASE CALL OFFICE TO SCHEDULE APPOINTMENT PRIOR TO NEXT REFILL (Patient taking differently: Take 50 mg by mouth 2 (two) times daily. PLEASE CALL OFFICE TO SCHEDULE APPOINTMENT PRIOR TO NEXT REFILL)   furosemide (LASIX) 20 MG tablet TAKE 1 TABLET BY MOUTH DAILY AS NEEDED (FOR SWELLING OR WEIGHT GAIN OF 3 LBS OVERNIGHT OR 5 LBS IN A WEEK). (Patient taking differently: Take 20 mg by mouth daily as needed for fluid or edema.)   hydrocortisone (ANUSOL-HC) 25 MG suppository Place 1 suppository (25 mg total) rectally 2 (two) times daily.   mesalamine (LIALDA) 1.2 g EC tablet TAKE 4  TABLETS BY MOUTH DAILY WITH BREAKFAST (Patient taking differently: Take 4.8 g by mouth daily with breakfast. TAKE 4 TABLETS BY MOUTH DAILY WITH BREAKFAST)   mometasone-formoterol (DULERA) 200-5 MCG/ACT AERO Inhale 2 puffs into the lungs 2 (two) times daily. Rinse mouth well after use.   rosuvastatin (CRESTOR) 10 MG tablet TAKE 1 TABLET BY MOUTH DAILY   XARELTO 20 MG TABS tablet TAKE 1 TABLET BY MOUTH EVERY DAY WITH SUPPER   [DISCONTINUED] Azelastine HCl 137 MCG/SPRAY SOLN PLACE 1 SPRAY INTO BOTH NOSTRILS TWICE DAILY  (Patient taking differently: Place 1 spray into both nostrils 2 (two) times daily.)   [DISCONTINUED] dupilumab (DUPIXENT) 300 MG/2ML prefilled syringe Inject 300 mg into the skin every 14 (fourteen) days.   [DISCONTINUED] ipratropium (ATROVENT) 0.02 % nebulizer solution Take 2.5 mLs (0.5 mg total) by nebulization 4 (four) times daily.    Immunization History  Administered Date(s) Administered   Fluad Quad(high Dose 65+) 11/01/2020, 10/29/2021   Fluad Trivalent(High Dose 65+) 10/16/2022   Influenza Inj Mdck Quad Pf 10/28/2017   Influenza Split 11/24/2013   Influenza, High Dose Seasonal PF 11/05/2016, 10/29/2017, 10/06/2018, 11/03/2019   Influenza,inj,Quad PF,6+ Mos 11/08/2014, 11/26/2015   Influenza-Unspecified 10/11/2013, 10/11/2013   PFIZER(Purple Top)SARS-COV-2 Vaccination 05/06/2019, 05/31/2019, 01/27/2020   PNEUMOCOCCAL CONJUGATE-20 06/03/2022   Pneumococcal Conjugate-13 04/18/2014   Pneumococcal Polysaccharide-23 11/26/2015   Respiratory Syncytial Virus Vaccine,Recomb Aduvanted(Arexvy) 10/14/2022   Tdap 02/13/2011   Zoster Recombinant(Shingrix) 12/01/2016, 03/02/2017   Zoster, Live 11/07/2010        Objective:     BP 116/68 (BP Location: Right Arm, Cuff Size: Normal)   Pulse 83   Temp 97.8 F (36.6 C)   Ht 5\' 10"  (1.778 m)   Wt 134 lb 12.8 oz (61.1 kg)   SpO2 94%   BMI 19.34 kg/m   SpO2: 94 % O2 Device: None (Room air)  GENERAL: Thin well-developed gentleman, no acute distress.  Well-groomed, fully ambulatory without assistance.  No conversational dyspnea. HEAD: Normocephalic, atraumatic.  EYES: Pupils equal, round, reactive to light.  No scleral icterus.  MOUTH: Dentition intact.  Oral mucosa moist.  No thrush. NECK: Supple. No thyromegaly. Trachea midline. No JVD.  No adenopathy. PULMONARY: Distant breath sounds bilaterally.  Coarse breath sounds, no wheezes or rhonchi noted. CARDIOVASCULAR: S1 and S2.  Regular rate and rhythm.  No rubs, murmurs or gallops  heard. ABDOMEN: Benign. MUSCULOSKELETAL: No joint deformity, no clubbing, no edema.  Muscle tone improved.   NEUROLOGIC: No overt focal deficit, no gait disturbance, speech is fluent. SKIN: Intact,warm,dry. PSYCH: Mood and behavior normal.      Assessment & Plan:     ICD-10-CM   1. Stage 3 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 (HCC)  J44.9 DG Chest 2 View    2. Severe persistent asthma without complication  J45.50     3. Nocturnal hypoxemia due to emphysema [facility])  J43.9    G47.36    Patient compliant with therapy Continue same Notes benefit of therapy    4. 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 Dyspnea, unspecified type  R06.00 DG Chest 2 View      Orders Placed This Encounter  Procedures   DG Chest 2 View    Standing Status:   Future    Standing Expiration Date:   03/08/2023    Order Specific Question:   Reason for Exam (SYMPTOM  OR DIAGNOSIS REQUIRED)    Answer:   Dyspnea, cough  Order Specific Question:   Preferred imaging location?    Answer:   [facility]    Meds ordered this encounter  Medications   Azelastine HCl 137 MCG/SPRAY SOLN    Sig: Place 1 spray into both nostrils 2 (two) times daily.    Dispense:  30 mL    Refill:  6   Discussion:     Chronic Obstructive Pulmonary Disease (COPD): Patient reports benefit from Ultiva nebulizer medication. Discussed the importance of daily use, except for the day of the patient's daughter-in-law's graduation. -Continue Ohtuvayre daily, except for the day of the graduation.  Vasomotor Rhinitis: Patient reports increased nasal drainage with exertion, such as climbing stairs. -Continue current management.  Asthma/COPD: Patient uses Dulera and Proventil (Albuterol) as needed. No current issues with these medications. -Continue Dulera and Proventil as needed.   Azelatine for Nasal Congestion: Patient requests a refill of Azelastine for nasal congestion. -Refill  Azelastine prescription.  Dupixent: Patient requests a refill of Dupixent. -Refill Dupixent prescription.  Possible Pneumonia: Patient reports back pain and has a history of pneumonia. Lung sounds distant due to COPD. -Order chest x-ray to rule out pneumonia.  Follow-up in 8 weeks.      Gailen Shelter, MD Advanced Bronchoscopy PCCM [facility]    *This note was generated using voice recognition software/Dragon and/or AI transcription program.  Despite best efforts to proofread, errors can occur which can change the meaning. Any transcriptional errors that result from this process are unintentional and may not be fully corrected at the time of dictation.

## 2023-01-06 NOTE — Telephone Encounter (Signed)
Patient was seen in the office today. He needs a refill on his Dupixent.  Pharmacy team please advise. Thank you!

## 2023-01-06 NOTE — Patient Instructions (Signed)
We have ordered a chest x-ray you can have this done tomorrow at your convenience.  We sent in for your Dupixent renewal to our pharmacy team.  Your prescription for azelastine was sent to the pharmacy.  Continue taking your Primitivo Gauze and Dupixent.  Continue your oxygen at nighttime.  We will see you in follow-up in 2 months time call sooner should any new problems arise.

## 2023-01-06 NOTE — Telephone Encounter (Signed)
Refill for Dupixent sent to [facility]  Chesley Mires, PharmD, MPH, BCPS, CPP Clinical Pharmacist (Rheumatology and Pulmonology)

## 2023-01-07 ENCOUNTER — Ambulatory Visit
Admission: RE | Admit: 2023-01-07 | Discharge: 2023-01-07 | Disposition: A | Discharge status: Home / Self Care | Payer: PPO | Source: Ambulatory Visit | Attending: Pulmonary Disease | Admitting: Pulmonary Disease

## 2023-01-07 ENCOUNTER — Telehealth: Payer: Self-pay | Admitting: Pulmonary Disease

## 2023-01-07 ENCOUNTER — Ambulatory Visit
Admission: RE | Admit: 2023-01-07 | Discharge: 2023-01-07 | Disposition: A | Discharge status: Home / Self Care | Payer: PPO | Attending: Pulmonary Disease | Admitting: Pulmonary Disease

## 2023-01-07 DIAGNOSIS — R0600 Dyspnea, unspecified: Secondary | ICD-10-CM

## 2023-01-07 DIAGNOSIS — J449 Chronic obstructive pulmonary disease, unspecified: Secondary | ICD-10-CM

## 2023-01-07 DIAGNOSIS — R059 Cough, unspecified: Secondary | ICD-10-CM | POA: Diagnosis not present

## 2023-01-07 DIAGNOSIS — J439 Emphysema, unspecified: Secondary | ICD-10-CM | POA: Diagnosis not present

## 2023-01-07 NOTE — Telephone Encounter (Signed)
His chest x-ray does not show any evidence of pneumonia.

## 2023-01-07 NOTE — Telephone Encounter (Signed)
Pt called in to inform Dr. Jayme Cloud that he has completed his CXR

## 2023-01-07 NOTE — Telephone Encounter (Signed)
I have notified the patient. Nothing further needed.

## 2023-01-15 ENCOUNTER — Ambulatory Visit (INDEPENDENT_AMBULATORY_CARE_PROVIDER_SITE_OTHER): Payer: PPO | Admitting: Internal Medicine

## 2023-01-15 ENCOUNTER — Encounter: Payer: Self-pay | Admitting: Internal Medicine

## 2023-01-15 ENCOUNTER — Ambulatory Visit: Payer: PPO | Admitting: Internal Medicine

## 2023-01-15 VITALS — BP 100/62 | HR 78 | Ht 70.0 in | Wt 137.0 lb

## 2023-01-15 DIAGNOSIS — K5120 Ulcerative (chronic) proctitis without complications: Secondary | ICD-10-CM | POA: Diagnosis not present

## 2023-01-15 DIAGNOSIS — K642 Third degree hemorrhoids: Secondary | ICD-10-CM | POA: Diagnosis not present

## 2023-01-15 DIAGNOSIS — K648 Other hemorrhoids: Secondary | ICD-10-CM

## 2023-01-15 DIAGNOSIS — R110 Nausea: Secondary | ICD-10-CM

## 2023-01-15 MED ORDER — ONDANSETRON 4 MG PO TBDP: 4.0000 mg | ORAL_TABLET | Freq: Three times a day (TID) | ORAL | 2 refills | Status: DC | PRN

## 2023-01-15 NOTE — Progress Notes (Signed)
Antonio [name] is a 77 year old male well-known to me with ulcerative proctitis and symptomatic bleeding internal hemorrhoids with prior band ligation, history of colon polyps, GERD and dyspepsia, COPD on nocturnal oxygen and atrial fibrillation on Eliquis who is here for hemorrhoidal banding  He is here today with his wife and was last seen on 11/25/2022  Despite Preparation H suppositories and intermittent hydrocortisone 25 mg suppositories he is still having daily hemorrhoidal prolapse.  Occasional fecal smearing and bleeding.  He is only had 2 episodes of bleeding of any significant since his last visit.  He does have to wear a pad.  He is having intermittent nausea and request Zofran refill  He has continued Lialda 2.4 g daily and is not having diarrhea.   PROCEDURE NOTE:  The patient presents with symptomatic grade 3 internal hemorrhoids, requesting rubber band ligation of his hemorrhoidal disease.  All risks, benefits and alternative forms of therapy were described and informed consent was obtained.   The anorectum was pre-medicated with 0.125% nitroglycerin ointment The decision was made to band the RP internal hemorrhoid, and the [facility] System was used to perform band ligation without complication.   Digital anorectal examination was then performed to assure proper positioning of the band, and to adjust the banded tissue as required.  The patient was discharged home without pain or other issues.  Dietary and behavioral recommendations were given and along with follow-up instructions.     The following adjunctive treatments were recommended: -- Zofran 4 mg every 6-8 hours ODT as needed nausea -- Preparation H suppositories as needed -- Continue Lialda 2.4 g daily -- Follow-up with me in 4 to 8 weeks and consider banding of the right anterior hemorrhoidal vein   The patient will return as scheduled for follow-up and possible additional banding as required. No complications  were encountered and the patient tolerated the procedure well.

## 2023-01-15 NOTE — Patient Instructions (Signed)
HEMORRHOID BANDING PROCEDURE    FOLLOW-UP CARE   The procedure you have had should have been relatively painless since the banding of the area involved does not have nerve endings and there is no pain sensation.  The rubber band cuts off the blood supply to the hemorrhoid and the band may fall off as soon as 48 hours after the banding (the band may occasionally be seen in the toilet bowl following a bowel movement). You may notice a temporary feeling of fullness in the rectum which should respond adequately to plain Tylenol or Motrin.  Following the banding, avoid strenuous exercise that evening and resume full activity the next day.  A sitz bath (soaking in a warm tub) or bidet is soothing, and can be useful for cleansing the area after bowel movements.     To avoid constipation, take two tablespoons of natural wheat bran, natural oat bran, flax, Benefiber or any over the counter fiber supplement and increase your water intake to 7-8 glasses daily.    Unless you have been prescribed anorectal medication, do not put anything inside your rectum for two weeks: No suppositories, enemas, fingers, etc.  Occasionally, you may have more bleeding than usual after the banding procedure.  This is often from the untreated hemorrhoids rather than the treated one.  Don't be concerned if there is a tablespoon or so of blood.  If there is more blood than this, lie flat with your bottom higher than your head and apply an ice pack to the area. If the bleeding does not stop within a half an hour or if you feel faint, call our office at [phone] or go to the emergency room.  Problems are not common; however, if there is a substantial amount of bleeding, severe pain, chills, fever or difficulty passing urine (very rare) or other problems, you should call us at [phone] or report to the nearest emergency room.  Do not stay seated continuously for more than 2-3 hours for a day or two after the procedure.   Tighten your buttock muscles 10-15 times every two hours and take 10-15 deep breaths every 1-2 hours.  Do not spend more than a few minutes on the toilet if you cannot empty your bowel; instead re-visit the toilet at a later time.   We have sent the following medications to your pharmacy for you to pick up at your convenience: Zofran ODT 4 mg   _______________________________________________________  If your blood pressure at your visit was 140/90 or greater, please contact your primary care physician to follow up on this.  _______________________________________________________  If you are age 79 or older, your body mass index should be between 23-30. Your Body mass index is 19.66 kg/m. If this is out of the aforementioned range listed, please consider follow up with your Primary Care Provider.  If you are age 79 or younger, your body mass index should be between 19-25. Your Body mass index is 19.66 kg/m. If this is out of the aformentioned range listed, please consider follow up with your Primary Care Provider.   ________________________________________________________  The [facility] providers would like to encourage you to use [facility] to communicate with providers for non-urgent requests or questions.  Due to long hold times on the telephone, sending your provider a message by [facility] may be a faster and more efficient way to get a response.  Please allow 48 business hours for a response.  Please remember that this is for non-urgent requests.  _______________________________________________________   

## 2023-01-23 ENCOUNTER — Ambulatory Visit: Payer: PPO | Admitting: Physician Assistant

## 2023-01-26 ENCOUNTER — Ambulatory Visit: Payer: PPO | Admitting: Physician Assistant

## 2023-01-26 VITALS — BP 128/70 | HR 72 | Ht 70.0 in | Wt 136.0 lb

## 2023-01-26 DIAGNOSIS — N400 Enlarged prostate without lower urinary tract symptoms: Secondary | ICD-10-CM

## 2023-01-26 LAB — MICROSCOPIC EXAMINATION

## 2023-01-26 LAB — URINALYSIS, COMPLETE
Bilirubin, UA: NEGATIVE
Glucose, UA: NEGATIVE
Ketones, UA: NEGATIVE
Leukocytes,UA: NEGATIVE
Nitrite, UA: NEGATIVE
Protein,UA: NEGATIVE
RBC, UA: NEGATIVE
Specific Gravity, UA: >1.03 — ABNORMAL HIGH (ref 1.005–1.030)
Urobilinogen, Ur: 0.2 mg/dL (ref 0.2–1.0)
pH, UA: 5.5 (ref 5.0–7.5)

## 2023-01-26 LAB — BLADDER SCAN AMB NON-IMAGING: Scan Result: 0

## 2023-01-26 NOTE — Progress Notes (Signed)
01/26/2023 2:15 PM   Antonio [name] 1945/05/09 [id number]  CC: Chief Complaint  Patient presents with   Benign Prostatic Hypertrophy   HPI: Antonio [name] is a 77 y.o. male with PMH BPH with urinary retention in the setting of constipation and narcotic cough syrup on Flomax who presents today for annual follow-up.   Today he reports no acute concerns.  He was hospitalized for 4 days with pneumonia earlier this year but has recovered well.  He is about to run out of Flomax and wonders if he needs to continue it.  IPSS 0/delighted as below.  PVR 0 mL.  In office UA and microscopy pan negative.   IPSS     Row Name 01/26/23 1300         International Prostate Symptom Score   How often have you had the sensation of not emptying your bladder? Not at All     How often have you had to urinate less than every two hours? Not at All     How often have you found you stopped and started again several times when you urinated? Not at All     How often have you found it difficult to postpone urination? Not at All     How often have you had a weak urinary stream? Not at All     How often have you had to strain to start urination? Not at All     How many times did you typically get up at night to urinate? None     Total IPSS Score 0       Quality of Life due to urinary symptoms   If you were to spend the rest of your life with your urinary condition just the way it is now how would you feel about that? Delighted               PMH: Past Medical History:  Diagnosis Date   Allergy    Calf pain    COPD (chronic obstructive pulmonary disease) (HCC)    no Oxygen per pt   COVID-19    Dyspnea    Essential hypertension    History of echocardiogram    a. 02/2021 Echo: EF 55%, no rwma, nl RV fxn.   Hypokalemia    Internal hemorrhoids    Mixed hyperlipidemia    Muscle cramp    Muscle pain    Nonobstructive CAD (coronary artery disease)    a. 06/2021 MV: EF 65%, no  isch/infarct,Cor Ca2+; b. 02/2022 Cor CTA: LM 25, LAD 25-49, LCX nl, OM1/2 nl, RCA nondom, nl. Ca2+ = 252 (42nd%'ile).   PAD (peripheral artery disease) (HCC)    a. s/p L SFA stenting.   Paroxysmal atrial fibrillation (HCC) 08/2019   a. CHA2DS2VASc = 4-->xarelto/flecainide.   Pneumonia    PONV (postoperative nausea and vomiting)    Tubular adenoma of colon    Ulcerative colitis (HCC)     Surgical History: Past Surgical History:  Procedure Laterality Date   ANTERIOR CERVICAL DECOMP/DISCECTOMY FUSION N/A 05/05/2016   Procedure: ANTERIOR CERVICAL DECOMPRESSION FUSION CERVICAL THREE-FOUR.;  Surgeon: Julio Sicks, MD;  Location: [facility] OR;  Service: Neurosurgery;  Laterality: N/A;  right side approach   CATARACT EXTRACTION Bilateral    CERVICAL DISCECTOMY  04/2016   C3-C4   COLONOSCOPY     COLONOSCOPY  08/09/2015   HEMORRHOID SURGERY  1988   leg stent Left approx 5-6 yrs ago   2 to left leg  MENISCUS REPAIR Left 12/2013   MOHS SURGERY     POLYPECTOMY     ROTATOR CUFF REPAIR Left 2007   TONSILLECTOMY AND ADENOIDECTOMY  1957    Home Medications:  Allergies as of 01/26/2023       Reactions   Penicillins Rash   Has patient had a PCN reaction causing immediate rash, facial/tongue/throat swelling, SOB or lightheadedness with hypotension:Yes Has patient had a PCN reaction causing severe rash involving mucus membranes or skin necrosis:Yes Has patient had a PCN reaction that required hospitalization:No Has patient had a PCN reaction occurring within the last 10 years:No If all of the above answers are "NO", then may proceed with Cephalosporin use.        Medication List        Accurate as of January 26, 2023  2:15 PM. If you have any questions, ask your nurse or doctor.          albuterol 108 (90 Base) MCG/ACT inhaler Commonly known as: VENTOLIN HFA Inhale 2 puffs into the lungs every 6 (six) hours as needed for wheezing or shortness of breath.   albuterol (2.5 MG/3ML) 0.083%  nebulizer solution Commonly known as: PROVENTIL Take 3 mLs (2.5 mg total) by nebulization every 6 (six) hours as needed for wheezing or shortness of breath.   Azelastine HCl 137 MCG/SPRAY Soln Place 1 spray into both nostrils 2 (two) times daily.   diltiazem 120 MG 24 hr capsule Commonly known as: CARDIZEM CD TAKE 1 CAPSULE BY MOUTH EVERY DAY   Dulera 200-5 MCG/ACT Aero Generic drug: mometasone-formoterol Inhale 2 puffs into the lungs 2 (two) times daily. Rinse mouth well after use.   Dupixent 300 MG/2ML prefilled syringe Generic drug: dupilumab Inject 300 mg into the skin every 14 (fourteen) days.   flecainide 50 MG tablet Commonly known as: TAMBOCOR Take 1.5 tablets (75 mg total) by mouth 2 (two) times daily. PLEASE CALL OFFICE TO SCHEDULE APPOINTMENT PRIOR TO NEXT REFILL What changed: how much to take   furosemide 20 MG tablet Commonly known as: LASIX TAKE 1 TABLET BY MOUTH DAILY AS NEEDED (FOR SWELLING OR WEIGHT GAIN OF 3 LBS OVERNIGHT OR 5 LBS IN A WEEK). What changed: See the new instructions.   hydrocortisone 25 MG suppository Commonly known as: ANUSOL-HC Place 1 suppository (25 mg total) rectally 2 (two) times daily.   mesalamine 1.2 g EC tablet Commonly known as: LIALDA TAKE 4 TABLETS BY MOUTH DAILY WITH BREAKFAST What changed:  how much to take how to take this when to take this   nitroGLYCERIN 0.4 MG SL tablet Commonly known as: NITROSTAT Place 1 tablet (0.4 mg total) under the tongue every 5 (five) minutes as needed for chest pain.   Ohtuvayre 3 MG/2.5ML Susp Generic drug: Ensifentrine Inhale 3 mg into the lungs in the morning and at bedtime.   ondansetron 4 MG disintegrating tablet Commonly known as: ZOFRAN-ODT Take 1 tablet (4 mg total) by mouth every 8 (eight) hours as needed for nausea or vomiting.   rosuvastatin 10 MG tablet Commonly known as: CRESTOR TAKE 1 TABLET BY MOUTH DAILY   Xarelto 20 MG Tabs tablet Generic drug: rivaroxaban TAKE 1  TABLET BY MOUTH EVERY DAY WITH SUPPER        Allergies:  Allergies  Allergen Reactions   Penicillins Rash    Has patient had a PCN reaction causing immediate rash, facial/tongue/throat swelling, SOB or lightheadedness with hypotension:Yes Has patient had a PCN reaction causing severe rash involving mucus membranes  or skin necrosis:Yes Has patient had a PCN reaction that required hospitalization:No Has patient had a PCN reaction occurring within the last 10 years:No If all of the above answers are "NO", then may proceed with Cephalosporin use.     Family History: Family History  Problem Relation Age of Onset   Cancer Mother        leukemia   Colon polyps Mother    Heart disease Father    Heart attack Father 75   Healthy Daughter    Healthy Son    Colon cancer Neg Hx    Esophageal cancer Neg Hx    Rectal cancer Neg Hx    Stomach cancer Neg Hx     Social History:   reports that he quit smoking about 11 years ago. His smoking use included cigarettes and cigars. He started smoking about 46 years ago. He has a 17.5 pack-year smoking history. He has never used smokeless tobacco. He reports current alcohol use of about 6.0 standard drinks of alcohol per week. He reports that he does not use drugs.  Physical Exam: BP 128/70   Pulse 72   Ht 5\' 10"  (1.778 m)   Wt 136 lb (61.7 kg)   BMI 19.51 kg/m   Constitutional:  Alert and oriented, no acute distress, nontoxic appearing HEENT: [place], AT Cardiovascular: No clubbing, cyanosis, or edema Respiratory: Normal respiratory effort, no increased work of breathing Skin: No rashes, bruises or suspicious lesions Neurologic: Grossly intact, no focal deficits, moving all 4 extremities Psychiatric: Normal mood and affect  Laboratory Data: Results for orders placed or performed in visit on 01/26/23  Microscopic Examination   Collection Time: 01/26/23  1:33 PM   Urine  Result Value Ref Range   WBC, UA 0-5 0 - 5 /hpf   RBC, Urine 0-2 0 -  2 /hpf   Epithelial Cells (non renal) 0-10 0 - 10 /hpf   Bacteria, UA Few None seen/Few  Urinalysis, Complete   Collection Time: 01/26/23  1:33 PM  Result Value Ref Range   Specific Gravity, UA >1.030 (H) 1.005 - 1.030   pH, UA 5.5 5.0 - 7.5   Color, UA Yellow Yellow   Appearance Ur Clear Clear   Leukocytes,UA Negative Negative   Protein,UA Negative Negative/Trace   Glucose, UA Negative Negative   Ketones, UA Negative Negative   RBC, UA Negative Negative   Bilirubin, UA Negative Negative   Urobilinogen, Ur 0.2 0.2 - 1.0 mg/dL   Nitrite, UA Negative Negative   Microscopic Examination See below:   Bladder Scan (Post Void Residual) in office   Collection Time: 01/26/23  1:35 PM  Result Value Ref Range   Scan Result 0    Assessment & Plan:   1. Benign prostatic hyperplasia without lower urinary tract symptoms (Primary) No LUTS on Flomax, emptying Foley, UA bland.  Okay to proceed with Flomax drug holiday.  We discussed that if he has no worsening in his urinary symptoms off Flomax, he can remain off it and follow-up with [place] as needed.  Otherwise, we will refill for another year and see him next year for annual follow-up. - Urinalysis, Complete - Bladder Scan (Post Void Residual) in office  Return if symptoms worsen or fail to improve.  Carman Ching, PA-C  [facility] [place] [address] [place], [place] [address] [phone]

## 2023-02-05 ENCOUNTER — Telehealth: Payer: Self-pay | Admitting: Pulmonary Disease

## 2023-02-05 ENCOUNTER — Encounter: Payer: Self-pay | Admitting: Pulmonary Disease

## 2023-02-05 NOTE — Telephone Encounter (Signed)
Pharmacy team, please advise. Thanks 

## 2023-02-05 NOTE — Telephone Encounter (Signed)
Patient needs dupixent re-enrollment form. He thinks he dropped it off at his last visit but is unsure. He also needs a refill of the Dupixent, he is down to one shot. Please call and advise [phone]

## 2023-02-06 NOTE — Telephone Encounter (Signed)
Patient called Antonio [name] and was able to get one more shipment from [place]. MyChart message sent to patient about Antonio [name] renewal form. Pharmacy team at clinic can get this sent out if needed. Awaiting respones from pt.  Chesley Mires, PharmD, MPH, BCPS, CPP Clinical Pharmacist (Rheumatology and Pulmonology)

## 2023-02-07 ENCOUNTER — Other Ambulatory Visit: Payer: Self-pay | Admitting: Cardiology

## 2023-02-07 ENCOUNTER — Other Ambulatory Visit: Payer: Self-pay | Admitting: Physician Assistant

## 2023-02-07 DIAGNOSIS — N400 Enlarged prostate without lower urinary tract symptoms: Secondary | ICD-10-CM

## 2023-02-09 NOTE — Telephone Encounter (Signed)
last office visit: 04/30/22 with plan to f/u in 11/2022  next office visit: 02/18/23

## 2023-02-12 ENCOUNTER — Telehealth: Payer: Self-pay | Admitting: Pharmacist

## 2023-02-12 NOTE — Telephone Encounter (Signed)
 [employer] PAP renewal form for Part D patients mailed to patient's home

## 2023-02-12 NOTE — Telephone Encounter (Signed)
 [employer] PAP renewal form for Part D patients mailed to patient's home per his request.  Chesley Mires, PharmD, MPH, BCPS, CPP Clinical Pharmacist (Rheumatology and Pulmonology)

## 2023-02-17 NOTE — Progress Notes (Signed)
 Electrophysiology Clinic Note    Date:  02/18/2023  Patient ID:  Antonio [name], Antonio [name] [birth year], MRN [id number] PCP:  Joshua Cathryne BROCKS, MD  Cardiologist:  Lonni Hanson, MD Electrophysiologist: OLE ONEIDA HOLTS, MD    Discussed the use of AI scribe software for clinical note transcription with the patient, who gave verbal consent to proceed.   Patient Profile    Chief Complaint: parox AFib  History of Present Illness: Antonio [name] is a 78 y.o. male with PMH notable for parox AFib, non-obs CAD, PAD, HTN, HLD, COPD, severe asthma; seen today for OLE ONEIDA HOLTS, MD for routine electrophysiology followup.  He last saw Dr. Holts 04/2022. He is not a candidate for invasive EP procedures d/t severe lung disease. On flecainide  for rhythm mgmt.  On follow-up today, he is doing very well from an afib stand point. He has not had any recent palpitation episodes, has not needed PRN metoprolol  at all.  He recently ran out of diltiazem . Continues to take 50mg  flecainide  BID. Was previously rx'd 75mg  BID, but is unable to cut the pills in half d/t small size of pills.   Continues to take xarelto . He has had several bleeding hemorrhoids lately, being treated with banding. Is not currently having bloody bowel movements, but has in the past. He uses oxygen  at night. Ever since covid diagnosis 4 years ago, has change in senses of taste and smell. Food does not taste as good, has reduced appetite. He belives his weight is stable. He denies dizziness, LH, chest pain, chest pressure, edema.     AAD History: Flecainide      ROS:  Please see the history of present illness. All other systems are reviewed and otherwise negative.    Physical Exam    VS:  BP (!) 130/50 (BP Location: Left Arm, Patient Position: Sitting, Cuff Size: Normal)   Pulse 62   Ht 5' 10 (1.778 m)   Wt 134 lb (60.8 kg)   SpO2 97%   BMI 19.23 kg/m  BMI: Body mass index is 19.23 kg/m.  Wt Readings from  Last 3 Encounters:  02/18/23 134 lb (60.8 kg)  01/26/23 136 lb (61.7 kg)  01/15/23 137 lb (62.1 kg)     GEN- The patient is frail-appearing, thin. Alert and oriented x 3 today.   Lungs- Clear to ausculation bilaterally, normal work of breathing.  Heart- Regular rate and rhythm, no murmurs, rubs or gallops Extremities- No peripheral edema, warm, dry    Studies Reviewed   Previous EP, cardiology notes.    EKG is ordered. Personal review of EKG from today shows:    EKG Interpretation Date/Time:  Wednesday February 18 2023 09:37:47 EST Ventricular Rate:  62 PR Interval:  205 QRS Duration:  84 QT Interval:  402 QTC Calculation: 408 R Axis:   87  Text Interpretation: Normal sinus rhythm with 1st degree A-V block Normal ECG Confirmed by Haskel Dewalt [phone]) on 02/18/2023 9:41:06 AM    12/23/2022 EKG SR w bigeminal PACs, rate 81bpm PR QRS 76ms QT 357  09/09/2022 EKG -  SB at 52 PR QRS 82 QT 426  Coronary CT, 03/06/2022 1. Coronary calcium  score of 251. This was 42nd percentile for age and sex matched control.  2. Normal coronary origin with left dominance.  3. Mild distal LM and proximal LAD stenosis (25-49%)  4. CAD-RADS 2. Mild non-obstructive CAD (25-49%). Consider non-atherosclerotic causes of chest pain. Consider preventive therapy and risk factor modification.  TTE, 03/11/2021  1. Left ventricular ejection fraction, by estimation, is 55%. Left ventricular ejection fraction by 2D MOD biplane is 54.2 %. The left ventricle has normal function. The left ventricle has no regional wall motion abnormalities. Left ventricular diastolic parameters were normal.   2. Right ventricular systolic function is normal. The right ventricular size is normal.   3. The mitral valve is normal in structure. No evidence of mitral valve regurgitation.   4. The aortic valve is tricuspid. Aortic valve regurgitation is not visualized.   5. The inferior vena cava is normal in size with  greater than 50% respiratory variability, suggesting right atrial pressure of 3 mmHg.      Assessment and Plan     #) parox AFib #) flecainide  monitoring No recent palpitation episodes EKG with slightly increased PR interval, compared to most recent. Though on review of older EKGs, PR interval is stable Continue flecainide  50mg  BID Refill cardizem  120mg  - to be taken at night Requested patient monitor for s/s of hypotension including dizziness, increased fatigue Due for updated BMP, Mag for flecainide  monitoring.   #) Hypercoag d/t parox afib CHA2DS2-VASc Score = at least 4 [CHF History: 0, HTN History: 1, Diabetes History: 0, Stroke History: 0, Vascular Disease History: 1, Age Score: 2, Gender Score: 0].  Therefore, the patient's annual risk of stroke is 4.8 %.    Stroke ppx - xarelto  Reduce dose of xarelto  to 15mg  d/t CrCL < 74ml/min Recommend updated CBC iso of xarelto  with hemorrhoids. Patient is scheduled to see PCP soon and prefers labwork to be drawn at that visit.    #) COPD #) weight loss Follows regularly with Dr. Tamea Severe lung disease prevents invasive EP procedures Encouraged small, frequent meals throughout the day, including high-fat, high protein. Consider nutritional supplements       Current medicines are reviewed at length with the patient today.   The patient does not have concerns regarding his medicines.  The following changes were made today:   REDUCE flecainide  to 50mg  BID RESTART diltiazem  120mg  nightly  Labs/ tests ordered today include:  Orders Placed This Encounter  Procedures   EKG 12-Lead     Disposition: Follow up with Dr. Cindie or EP APP in 3 months   Signed, Chantal Needle, NP  02/18/23  12:15 PM  Electrophysiology [facility]

## 2023-02-18 ENCOUNTER — Encounter: Payer: Self-pay | Admitting: Cardiology

## 2023-02-18 ENCOUNTER — Ambulatory Visit: Payer: PPO | Admitting: Cardiology

## 2023-02-18 ENCOUNTER — Ambulatory Visit: Payer: PPO | Attending: Cardiology | Admitting: Cardiology

## 2023-02-18 VITALS — BP 130/50 | HR 62 | Ht 70.0 in | Wt 134.0 lb

## 2023-02-18 DIAGNOSIS — Z5181 Encounter for therapeutic drug level monitoring: Secondary | ICD-10-CM

## 2023-02-18 DIAGNOSIS — J449 Chronic obstructive pulmonary disease, unspecified: Secondary | ICD-10-CM

## 2023-02-18 DIAGNOSIS — Z79899 Other long term (current) drug therapy: Secondary | ICD-10-CM | POA: Diagnosis not present

## 2023-02-18 DIAGNOSIS — D6869 Other thrombophilia: Secondary | ICD-10-CM | POA: Diagnosis not present

## 2023-02-18 DIAGNOSIS — I480 Paroxysmal atrial fibrillation: Secondary | ICD-10-CM

## 2023-02-18 DIAGNOSIS — K649 Unspecified hemorrhoids: Secondary | ICD-10-CM

## 2023-02-18 MED ORDER — RIVAROXABAN 15 MG PO TABS: 15.0000 mg | ORAL_TABLET | Freq: Every day | ORAL | 0 refills | Status: DC

## 2023-02-18 MED ORDER — DILTIAZEM HCL ER COATED BEADS 120 MG PO CP24: 120.0000 mg | ORAL_CAPSULE | Freq: Every day | ORAL | 0 refills | Status: DC

## 2023-02-18 MED ORDER — FLECAINIDE ACETATE 50 MG PO TABS: 50.0000 mg | ORAL_TABLET | Freq: Two times a day (BID) | ORAL | 0 refills | Status: DC

## 2023-02-18 NOTE — Patient Instructions (Signed)
 Medication Instructions:   Cardizem  - Take one tablet (120mg ) by mouth daily -- Refilled.  Flecainide  - Take one tablet ( 50mg ) by mouth twice a day -- Refilled. Xarelto  - Take one tablet ( 15mg ) by mouth daily -- refilled   *If you need a refill on your cardiac medications before your next appointment, please call your pharmacy*   Lab Work:  None Ordered  If you have labs (blood work) drawn today and your tests are completely normal, you will receive your results only by: MyChart Message (if you have MyChart) OR A paper copy in the mail If you have any lab test that is abnormal or we need to change your treatment, we will call you to review the results.   Testing/Procedures:  None Ordered    Follow-Up: At [facility], you and your health needs are our priority.  As part of our continuing mission to provide you with exceptional heart care, we have created designated Provider Care Teams.  These Care Teams include your primary Cardiologist (physician) and Advanced Practice Providers (APPs -  Physician Assistants and Nurse Practitioners) who all work together to provide you with the care you need, when you need it.  We recommend signing up for the patient portal called MyChart.  Sign up information is provided on this After Visit Summary.  MyChart is used to connect with patients for Virtual Visits (Telemedicine).  Patients are able to view lab/test results, encounter notes, upcoming appointments, etc.  Non-urgent messages can be sent to your provider as well.   To learn more about what you can do with MyChart, go to [email or web address].    Your next appointment:   3 month(s)  Provider:   Suzann Riddle, NP

## 2023-02-23 ENCOUNTER — Encounter: Payer: Self-pay | Admitting: Family Medicine

## 2023-03-02 ENCOUNTER — Encounter: Payer: Self-pay | Admitting: Family Medicine

## 2023-03-02 ENCOUNTER — Other Ambulatory Visit (INDEPENDENT_AMBULATORY_CARE_PROVIDER_SITE_OTHER): Payer: Self-pay | Admitting: Vascular Surgery

## 2023-03-02 ENCOUNTER — Ambulatory Visit (INDEPENDENT_AMBULATORY_CARE_PROVIDER_SITE_OTHER): Payer: PPO | Admitting: Family Medicine

## 2023-03-02 VITALS — BP 130/64 | HR 56 | Resp 18 | Ht 70.0 in | Wt 136.4 lb

## 2023-03-02 DIAGNOSIS — I70212 Atherosclerosis of native arteries of extremities with intermittent claudication, left leg: Secondary | ICD-10-CM

## 2023-03-02 DIAGNOSIS — J300 Vasomotor rhinitis: Secondary | ICD-10-CM

## 2023-03-02 DIAGNOSIS — I6523 Occlusion and stenosis of bilateral carotid arteries: Secondary | ICD-10-CM

## 2023-03-02 DIAGNOSIS — I10 Essential (primary) hypertension: Secondary | ICD-10-CM | POA: Diagnosis not present

## 2023-03-02 DIAGNOSIS — G479 Sleep disorder, unspecified: Secondary | ICD-10-CM | POA: Diagnosis not present

## 2023-03-02 DIAGNOSIS — E785 Hyperlipidemia, unspecified: Secondary | ICD-10-CM | POA: Diagnosis not present

## 2023-03-02 DIAGNOSIS — E559 Vitamin D deficiency, unspecified: Secondary | ICD-10-CM | POA: Insufficient documentation

## 2023-03-02 DIAGNOSIS — J449 Chronic obstructive pulmonary disease, unspecified: Secondary | ICD-10-CM

## 2023-03-02 DIAGNOSIS — I480 Paroxysmal atrial fibrillation: Secondary | ICD-10-CM

## 2023-03-02 DIAGNOSIS — R634 Abnormal weight loss: Secondary | ICD-10-CM

## 2023-03-02 DIAGNOSIS — D6869 Other thrombophilia: Secondary | ICD-10-CM | POA: Diagnosis not present

## 2023-03-02 DIAGNOSIS — K649 Unspecified hemorrhoids: Secondary | ICD-10-CM

## 2023-03-02 DIAGNOSIS — E538 Deficiency of other specified B group vitamins: Secondary | ICD-10-CM | POA: Diagnosis not present

## 2023-03-02 NOTE — Assessment & Plan Note (Signed)
Currently on Xarelto for AFib

## 2023-03-02 NOTE — Assessment & Plan Note (Signed)
Reports irregular sleep patterns and use of oxygen at night. No current use of CPAP or diagnosis of sleep apnea. -Discuss sleep study with pulmonologist.

## 2023-03-02 NOTE — Assessment & Plan Note (Signed)
Using albuterol inhaler and nebulizers as needed. Recently started on a new inhalation solution. -Continue current regimen. -Follows with Pulmonology

## 2023-03-02 NOTE — Assessment & Plan Note (Signed)
Lost weight during hospitalization for pneumonia in April of the previous year. Weight has been slowly increasing since discharge. -Monitor weight.

## 2023-03-02 NOTE — Assessment & Plan Note (Signed)
History of bleeding hemorrhoids, currently managed with Anusil ointment. No current bleeding reported.  -Banding procedure planned for tomorrow with GI. -Follows with GI

## 2023-03-02 NOTE — Progress Notes (Signed)
SUBJECTIVE:   Chief Complaint  Patient presents with   Establish Care   HPI Presents to establish care  Discussed the use of AI scribe software for clinical note transcription with the patient, who gave verbal consent to proceed.  History of Present Illness The patient, previously under the care of a retired physician, presents for establishing care. He has a complex medical history, including cardiac, pulmonary, and gastrointestinal conditions, managed by multiple specialists. The patient reports a history of atrial fibrillation, managed with Xarelto, which was recently reduced from 15mg  to 10mg  due to weight loss. He also has a history of COPD, managed with albuterol inhaler, nebulizers, and a new inhalation solution. The patient has discontinued Dulera since starting the new inhalation solution.  The patient also reports a history of ulcerative colitis, managed with mesalamine. He has experienced significant weight loss, which he attributes to a hospitalization for pneumonia in April of the previous year. The patient also reports a loss of smell and taste, which he attributes to a previous COVID-19 infection.  The patient has a history of vascular issues, managed by a vascular specialist, and has had stents placed in his leg. He also reports a history of urinary issues, which led to a brief period of catheterization and use of a urinary bag. He was previously on Flomax, which was discontinued due to urinary retention.  The patient also reports a history of hemorrhoids, which have been managed with Anusil ointment and banding procedures. He reports occasional bleeding from the hemorrhoids, which has been less severe recently. He is also on Xarelto, which may contribute to the bleeding.  The patient reports shortness of breath upon exertion, which he describes as usual for him. He also reports occasional sleep disturbances, waking up in the middle of the night and having difficulty returning  to sleep. He has not been tested for sleep apnea but uses oxygen at night, most of the time.  The patient also reports a history of high cholesterol, managed with rosuvastatin. He has a history of anxiety, but it is unclear if this is currently being managed with medication. The patient has expressed interest in discussing potential treatment options for anxiety at a future appointment.    PERTINENT PMH / PSH: As above  OBJECTIVE:  BP 130/64   Pulse (!) 56   Resp 18   Ht 5\' 10"  (1.778 m)   Wt 136 lb 6 oz (61.9 kg)   SpO2 97%   BMI 19.57 kg/m    Physical Exam Vitals reviewed.  HENT:     Head: Normocephalic.     Right Ear: Tympanic membrane, ear canal and external ear normal.     Left Ear: Tympanic membrane, ear canal and external ear normal.     Nose: Nose normal.     Mouth/Throat:     Mouth: Mucous membranes are moist.  Eyes:     Conjunctiva/sclera: Conjunctivae normal.     Pupils: Pupils are equal, round, and reactive to light.  Neck:     Thyroid: No thyromegaly or thyroid tenderness.     Vascular: No carotid bruit.  Cardiovascular:     Rate and Rhythm: Normal rate and regular rhythm.     Pulses: Normal pulses.     Heart sounds: Normal heart sounds.  Pulmonary:     Effort: Pulmonary effort is normal.     Breath sounds: Normal breath sounds.  Abdominal:     General: Abdomen is flat. Bowel sounds are normal.  Palpations: Abdomen is soft.  Musculoskeletal:        General: Normal range of motion.     Cervical back: Normal range of motion and neck supple.     Right lower leg: No edema.     Left lower leg: No edema.  Lymphadenopathy:     Cervical: No cervical adenopathy.  Neurological:     Mental Status: He is alert.  Psychiatric:        Mood and Affect: Mood normal.        Behavior: Behavior normal.        Thought Content: Thought content normal.        Judgment: Judgment normal.           10/16/2022    2:07 PM 06/03/2022    1:18 PM 12/30/2021    3:00 PM  12/16/2021   11:39 AM 12/17/2020    8:45 AM  Depression screen PHQ 2/9  Decreased Interest 0 0 0 0 0  Down, Depressed, Hopeless 0 0 0 0 0  PHQ - 2 Score 0 0 0 0 0  Altered sleeping 0 0 0 0 0  Tired, decreased energy 0 0 0 0 0  Change in appetite 0 0 0 0 0  Feeling bad or failure about yourself  0 0 0 0 0  Trouble concentrating 0 0 0 0 0  Moving slowly or fidgety/restless 0 0 0 0 0  Suicidal thoughts 0 0 0 0 0  PHQ-9 Score 0 0 0 0 0  Difficult doing work/chores Not difficult at all Not difficult at all Not difficult at all Not difficult at all       10/16/2022    2:07 PM 06/03/2022    1:19 PM 12/30/2021    3:00 PM 12/16/2021   11:39 AM  GAD 7 : Generalized Anxiety Score  Nervous, Anxious, on Edge 0 0 0 0  Control/stop worrying 0 0 0 0  Worry too much - different things 0 0 0 0  Trouble relaxing 0 0 0 0  Restless 0 0 0 0  Easily annoyed or irritable 0 0 0 0  Afraid - awful might happen 0 0 0 0  Total GAD 7 Score 0 0 0 0  Anxiety Difficulty Not difficult at all Not difficult at all Not difficult at all Not difficult at all    ASSESSMENT/PLAN:  Primary hypertension Assessment & Plan: Well controlled -Follows with Cardiology  Orders: -     Comprehensive metabolic panel; Future  Paroxysmal atrial fibrillation (HCC) Assessment & Plan: On Xarelto 15 mg daily for stroke prevention. No recent episodes of Afib reported. -Continue Xarelto 15 mg daily. -Managed by Cardiology  Orders: -     CBC with Differential/Platelet; Future -     Magnesium; Future  Stage 3 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 (HCC) Assessment & Plan: Intermittent use of oxygen at night Follows with Pulmonology    Centrilobular Emphysema Assessment & Plan: Using albuterol inhaler and nebulizers as needed. Recently started on a new inhalation solution. -Continue current regimen. -Follows with Pulmonology   Hyperlipidemia, unspecified hyperlipidemia type -     Lipid panel; Future  Vasomotor  rhinitis  Vitamin D deficiency -     VITAMIN D 25 Hydroxy (Vit-D Deficiency, Fractures); Future  Vitamin B 12 deficiency -     Vitamin B12; Future  Hemorrhoids, unspecified hemorrhoid type Assessment & Plan: History of bleeding hemorrhoids, currently managed with Anusil ointment. No current bleeding reported.  -Banding procedure planned  for tomorrow with GI. -Follows with GI   Sleep disturbances Assessment & Plan: Reports irregular sleep patterns and use of oxygen at night. No current use of CPAP or diagnosis of sleep apnea. -Discuss sleep study with pulmonologist.   Weight loss Assessment & Plan: Lost weight during hospitalization for pneumonia in April of the previous year. Weight has been slowly increasing since discharge. -Monitor weight.   Secondary hypercoagulable state [facility]) Assessment & Plan: Currently on Xarelto for AFib     PDMP reviewed  Return if symptoms worsen or fail to improve, for PCP.  Dana Allan, MD

## 2023-03-02 NOTE — Patient Instructions (Addendum)
It was a pleasure meeting you today. Thank you for allowing me to take part in your health care.  Our goals for today as we discussed include:  Schedule lab appointment.  Fast for 10 hours  Use Astelin nasal spray as directed.  There are 6 refills at the pharmacy.  Call to get refill.     This is a list of the screening recommended for you and due dates:  Health Maintenance  Topic Date Due   Medicare Annual Wellness Visit  04/17/2018   COVID-19 Vaccine (4 - 2024-25 season) 10/12/2022   Pneumonia Vaccine  Completed   Flu Shot  Completed   Zoster (Shingles) Vaccine  Completed   HPV Vaccine  Aged Out   DTaP/Tdap/Td vaccine  Discontinued   Colon Cancer Screening  Discontinued   Hepatitis C Screening  Discontinued     If you have any questions or concerns, please do not hesitate to call the office at [phone].  I look forward to our next visit and until then take care and stay safe.  Regards,   Dana Allan, MD   [facility]

## 2023-03-02 NOTE — Assessment & Plan Note (Signed)
Intermittent use of oxygen at night Follows with Pulmonology

## 2023-03-02 NOTE — Assessment & Plan Note (Signed)
Well controlled -Follows with Cardiology

## 2023-03-02 NOTE — Assessment & Plan Note (Signed)
On Xarelto 15 mg daily for stroke prevention. No recent episodes of Afib reported. -Continue Xarelto 15 mg daily. -Managed by Cardiology

## 2023-03-03 ENCOUNTER — Encounter: Payer: PPO | Admitting: Internal Medicine

## 2023-03-03 ENCOUNTER — Ambulatory Visit: Payer: PPO | Admitting: Internal Medicine

## 2023-03-03 ENCOUNTER — Encounter: Payer: Self-pay | Admitting: Internal Medicine

## 2023-03-03 VITALS — BP 124/62 | HR 70 | Ht 70.0 in | Wt 135.0 lb

## 2023-03-03 DIAGNOSIS — K642 Third degree hemorrhoids: Secondary | ICD-10-CM

## 2023-03-03 DIAGNOSIS — K648 Other hemorrhoids: Secondary | ICD-10-CM

## 2023-03-03 NOTE — Progress Notes (Signed)
Antonio [name] is an 78 year old with a history of ulcerative proctitis, symptomatic bleeding and prolapsing internal hemorrhoids, history of colon polyps, GERD and dyspepsia, COPD and A-fib on Eliquis who is here for additional hemorrhoidal banding  Hemorrhoidal banding had been done in the past but we did add additional hemorrhoidal band on 01/15/2023 to the right posterior internal hemorrhoid for prolapse predominantly  Symptoms better but still some prolapse he notices this predominantly on the right.  Rare bleeding with wiping.  No significant bleeding.  Better fecal smearing.  He is happy with response but wants to try additional banding for the right sided hemorrhoidal prolapse  We did discuss the slightly elevated risk of post banding bleeding on Eliquis.  He has tolerated this before and we will monitor for any bleeding that could occur as the band falls off.  PROCEDURE NOTE:  The patient presents with symptomatic grade 3 internal hemorrhoids, requesting rubber band ligation of his hemorrhoidal disease.  All risks, benefits and alternative forms of therapy were described and informed consent was obtained.   The anorectum was pre-medicated with 0.125% nitroglycerin ointment The decision was made to band the RA (RP banded at last visit) internal hemorrhoid, and the [facility] System was used to perform band ligation without complication.   Digital anorectal examination was then performed to assure proper positioning of the band, and to adjust the banded tissue as required.  The patient was discharged home without pain or other issues.  Dietary and behavioral recommendations were given and along with follow-up instructions.     The patient will return as needed for follow-up and possible additional banding as required. No complications were encountered and the patient tolerated the procedure well.

## 2023-03-03 NOTE — Patient Instructions (Signed)
HEMORRHOID BANDING PROCEDURE    FOLLOW-UP CARE   The procedure you have had should have been relatively painless since the banding of the area involved does not have nerve endings and there is no pain sensation.  The rubber band cuts off the blood supply to the hemorrhoid and the band may fall off as soon as 48 hours after the banding (the band may occasionally be seen in the toilet bowl following a bowel movement). You may notice a temporary feeling of fullness in the rectum which should respond adequately to plain Tylenol or Motrin.  Following the banding, avoid strenuous exercise that evening and resume full activity the next day.  A sitz bath (soaking in a warm tub) or bidet is soothing, and can be useful for cleansing the area after bowel movements.     To avoid constipation, take two tablespoons of natural wheat bran, natural oat bran, flax, Benefiber or any over the counter fiber supplement and increase your water intake to 7-8 glasses daily.    Unless you have been prescribed anorectal medication, do not put anything inside your rectum for two weeks: No suppositories, enemas, fingers, etc.  Occasionally, you may have more bleeding than usual after the banding procedure.  This is often from the untreated hemorrhoids rather than the treated one.  Don't be concerned if there is a tablespoon or so of blood.  If there is more blood than this, lie flat with your bottom higher than your head and apply an ice pack to the area. If the bleeding does not stop within a half an hour or if you feel faint, call our office at [phone] or go to the emergency room.  Problems are not common; however, if there is a substantial amount of bleeding, severe pain, chills, fever or difficulty passing urine (very rare) or other problems, you should call us at [phone] or report to the nearest emergency room.  Do not stay seated continuously for more than 2-3 hours for a day or two after the procedure.   Tighten your buttock muscles 10-15 times every two hours and take 10-15 deep breaths every 1-2 hours.  Do not spend more than a few minutes on the toilet if you cannot empty your bowel; instead re-visit the toilet at a later time.    

## 2023-03-06 ENCOUNTER — Ambulatory Visit (INDEPENDENT_AMBULATORY_CARE_PROVIDER_SITE_OTHER): Payer: PPO | Admitting: Nurse Practitioner

## 2023-03-06 ENCOUNTER — Encounter (INDEPENDENT_AMBULATORY_CARE_PROVIDER_SITE_OTHER): Payer: Self-pay

## 2023-03-06 ENCOUNTER — Encounter (INDEPENDENT_AMBULATORY_CARE_PROVIDER_SITE_OTHER): Payer: PPO

## 2023-03-10 ENCOUNTER — Ambulatory Visit: Payer: PPO | Admitting: Pulmonary Disease

## 2023-03-10 ENCOUNTER — Other Ambulatory Visit (HOSPITAL_COMMUNITY): Payer: Self-pay

## 2023-03-10 ENCOUNTER — Telehealth: Payer: Self-pay | Admitting: Pharmacy Technician

## 2023-03-12 ENCOUNTER — Other Ambulatory Visit (INDEPENDENT_AMBULATORY_CARE_PROVIDER_SITE_OTHER): Payer: PPO

## 2023-03-12 DIAGNOSIS — E538 Deficiency of other specified B group vitamins: Secondary | ICD-10-CM | POA: Diagnosis not present

## 2023-03-12 DIAGNOSIS — E785 Hyperlipidemia, unspecified: Secondary | ICD-10-CM | POA: Diagnosis not present

## 2023-03-12 DIAGNOSIS — I480 Paroxysmal atrial fibrillation: Secondary | ICD-10-CM

## 2023-03-12 DIAGNOSIS — I10 Essential (primary) hypertension: Secondary | ICD-10-CM | POA: Diagnosis not present

## 2023-03-12 DIAGNOSIS — E559 Vitamin D deficiency, unspecified: Secondary | ICD-10-CM | POA: Diagnosis not present

## 2023-03-12 LAB — CBC WITH DIFFERENTIAL/PLATELET
Basophils Absolute: 0.1 10*3/uL (ref 0.0–0.1)
Basophils Relative: 1.1 % (ref 0.0–3.0)
Eosinophils Absolute: 0.5 10*3/uL (ref 0.0–0.7)
Eosinophils Relative: 6.2 % — ABNORMAL HIGH (ref 0.0–5.0)
HCT: 40.2 % (ref 39.0–52.0)
Hemoglobin: 13.2 g/dL (ref 13.0–17.0)
Lymphocytes Relative: 22.3 % (ref 12.0–46.0)
Lymphs Abs: 1.8 10*3/uL (ref 0.7–4.0)
MCHC: 32.8 g/dL (ref 30.0–36.0)
MCV: 91.5 fL (ref 78.0–100.0)
Monocytes Absolute: 0.9 10*3/uL (ref 0.1–1.0)
Monocytes Relative: 10.4 % (ref 3.0–12.0)
Neutro Abs: 4.9 10*3/uL (ref 1.4–7.7)
Neutrophils Relative %: 60 % (ref 43.0–77.0)
Platelets: 207 10*3/uL (ref 150.0–400.0)
RBC: 4.39 Mil/uL (ref 4.22–5.81)
RDW: 12.6 % (ref 11.5–15.5)
WBC: 8.2 10*3/uL (ref 4.0–10.5)

## 2023-03-12 LAB — MAGNESIUM: Magnesium: 2 mg/dL (ref 1.5–2.5)

## 2023-03-12 LAB — COMPREHENSIVE METABOLIC PANEL
ALT: 14 U/L (ref 0–53)
AST: 19 U/L (ref 0–37)
Albumin: 4.1 g/dL (ref 3.5–5.2)
Alkaline Phosphatase: 66 U/L (ref 39–117)
BUN: 9 mg/dL (ref 6–23)
CO2: 28 meq/L (ref 19–32)
Calcium: 8.9 mg/dL (ref 8.4–10.5)
Chloride: 105 meq/L (ref 96–112)
Creatinine, Ser: 0.88 mg/dL (ref 0.40–1.50)
GFR: 81.47 mL/min (ref >60.00)
Glucose, Bld: 90 mg/dL (ref 70–99)
Potassium: 4.3 meq/L (ref 3.5–5.1)
Sodium: 142 meq/L (ref 135–145)
Total Bilirubin: 0.5 mg/dL (ref 0.2–1.2)
Total Protein: 6.8 g/dL (ref 6.0–8.3)

## 2023-03-12 LAB — LIPID PANEL
Cholesterol: 102 mg/dL (ref 0–200)
HDL: 49.2 mg/dL (ref >39.00)
LDL Cholesterol: 41 mg/dL (ref 0–99)
NonHDL: 52.4
Total CHOL/HDL Ratio: 2
Triglycerides: 56 mg/dL (ref 0.0–149.0)
VLDL: 11.2 mg/dL (ref 0.0–40.0)

## 2023-03-12 LAB — VITAMIN B12: Vitamin B-12: 731 pg/mL (ref 211–911)

## 2023-03-12 LAB — VITAMIN D 25 HYDROXY (VIT D DEFICIENCY, FRACTURES): VITD: 27.41 ng/mL — ABNORMAL LOW (ref 30.00–100.00)

## 2023-03-12 NOTE — Telephone Encounter (Signed)
I faxed the provider section for [facility] patient assistance to [phone].

## 2023-03-15 ENCOUNTER — Other Ambulatory Visit: Payer: Self-pay | Admitting: Family Medicine

## 2023-03-15 DIAGNOSIS — E559 Vitamin D deficiency, unspecified: Secondary | ICD-10-CM

## 2023-03-15 MED ORDER — VITAMIN D (ERGOCALCIFEROL) 1.25 MG (50000 UNIT) PO CAPS: 50000.0000 [IU] | ORAL_CAPSULE | ORAL | 1 refills | Status: DC

## 2023-03-19 ENCOUNTER — Encounter: Payer: Self-pay | Admitting: Pulmonary Disease

## 2023-03-19 ENCOUNTER — Ambulatory Visit: Payer: PPO | Admitting: Pulmonary Disease

## 2023-03-19 VITALS — BP 132/66 | HR 59 | Temp 97.6°F | Ht 70.0 in | Wt 132.6 lb

## 2023-03-19 DIAGNOSIS — J4550 Severe persistent asthma, uncomplicated: Secondary | ICD-10-CM | POA: Diagnosis not present

## 2023-03-19 DIAGNOSIS — J439 Emphysema, unspecified: Secondary | ICD-10-CM | POA: Diagnosis not present

## 2023-03-19 DIAGNOSIS — J449 Chronic obstructive pulmonary disease, unspecified: Secondary | ICD-10-CM

## 2023-03-19 DIAGNOSIS — G4736 Sleep related hypoventilation in conditions classified elsewhere: Secondary | ICD-10-CM

## 2023-03-19 NOTE — Patient Instructions (Signed)
 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 He reported variable breathing difficulties and a recent respiratory flare-up with symptoms similar to a cold. He is currently using Ohtuvayre , Dulera , and Dupixent , along with a rescue inhaler. He also uses azelastine  nasal spray for allergic rhinitis.  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 You should continue using Ohtuvayre  and Dulera  twice a day. Continue using your rescue inhaler (albuterol ) as needed. Please schedule a follow-up in six months.  -RECENT UPPER RESPIRATORY INFECTION: A recent upper respiratory infection is a viral infection that affects the nose, throat, and airways. You managed your symptoms with Mucinex , which you should continue using as needed.  INSTRUCTIONS:  Please schedule a follow-up appointment in six months. If any issues arise before the next appointment, do not hesitate to call.

## 2023-03-19 NOTE — Progress Notes (Signed)
 Subjective:    Patient ID: Antonio [name], male    DOB: [birth year], 78 y.o.   MRN: [id number]  Patient Care Team: Hope Merle, MD as PCP - General (Family Medicine) Cindie Ole DASEN, MD as PCP - Electrophysiology (Cardiology) End, Lonni, MD as PCP - Cardiology (Cardiology) Pyrtle, Gordy HERO, MD as Consulting Physician (Gastroenterology) Penne Knee, MD as Consulting Physician (Urology) Tamea Dedra CROME, MD as Consulting Physician (Pulmonary Disease)  Chief Complaint  Patient presents with   Follow-up    Cough, shortness of breath.     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  He was last seen here 06 January 2023.  Continues taking Ohtuvayre , notes benefit of the medication.  HPI Discussed the use of AI scribe software for clinical note transcription with the patient, who gave verbal consent to proceed.  History of Present Illness   Antonio [name] is a very complex 78 year old male with asthma and COPD overlap who presents for a follow-up visit.  He experiences variable breathing difficulties, with some days being better than others. He is currently using Ohtuvayre , which he finds effective, and also uses Dulera  in the morning and at night. He has a history of using Spiriva  but discontinued it due to urinary retention issues. He now experiences some urinary incontinence, mentioning bedwetting.  He is being followed by urology for these issues.  He carries a rescue inhaler, which he uses as needed, and has been prescribed Dupixent  injections, which he continues to use.  Last week, he experienced a respiratory flare-up characterized by symptoms similar to a cold, including a persistent cough and congestion. He compared the severity to a previous COVID-19 infection and managed symptoms with Mucinex  and ginger tea.  The illness was self-limiting  and did not aggravate his respiratory issues.  He uses azelastine  nasal spray for allergic rhinitis.    DATA 06/09/2014 pulmonary function test> ratio 51%, FEV1 1.47 L (40% predicted), total lung capacity 5.43 L (71% predicted). DLCO 16.26 (44% predicted) December 2017 lung function testing showed ratio 50%, FEV1 1.47 L 43% predicted, FVC 2.96 L 67% predicted March 2020 pulmonary function testing ratio 38%, FEV1 0.97 L 31% predicted, total lung capacity 8.44 L 119% predicted, residual volume 198% predicted, DLCO 14.0 55% predicted March 2020 high-resolution CT scan of the chest images : Centrilobular emphysema, no interstitial lung disease, aortic atherosclerosis 04/20/2018-eosinophils relative 7.3, eosinophils absolute 0.6 04/20/2018-IgE-67 01/20/2019-SARS-CoV-2-detected 03/06/2021 alpha 1 antitrypsin: Phenotype MM, level 165 03/11/2021 echocardiogram: LVEF 55%, right ventricular function normal.  No wall motion abnormalities. 04/03/2021 PFT: FEV1 1.09 L or 36% predicted, FVC 2.60 L or 61% predicted, FEV1/FVC 42%, no bronchodilator response. Lung volumes showed moderate air trapping.  Diffusion capacity severely decreased at 49%. 05/22/2022 chest x-ray 1 view: Patchy airspace disease in the medial right base concerning for pneumonia. 11/12/2022 chest x-ray PA and lateral: No overt infiltrate noted, changes of severe COPD noted. 01/07/2023 chest x-ray PA and lateral: Emphysema without evidence of acute cardiopulmonary disease.  Review of Systems A 10 point review of systems was performed and it is as noted above otherwise negative.   Patient Active Problem List   Diagnosis Date Noted   Hyperlipidemia 03/02/2023   Vitamin D  deficiency 03/02/2023   Vitamin B 12 deficiency 03/02/2023   Hemorrhoids 03/02/2023   Sleep disturbances 03/02/2023   Weight loss 03/02/2023   Demand ischemia (HCC) 05/24/2022  Pure hypercholesterolemia 05/24/2022   Primary hypertension 05/24/2022   Atrial  fibrillation with rapid ventricular response (HCC) 05/22/2022   Sepsis due to pneumonia (HCC) 05/22/2022   CAP (community acquired pneumonia) 05/22/2022   Nocturnal hypoxemia 05/22/2022   BPH (benign prostatic hyperplasia) 05/22/2022   History of urinary retention 05/22/2022   Coronary artery disease involving native coronary artery of native heart without angina pectoris 03/21/2022   Precordial pain 02/19/2022   Atypical chest pain 12/21/2021   DOE (dyspnea on exertion) 01/24/2021   Secondary hypercoagulable state (HCC) 12/19/2020   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   PAD ss/p SFA stent(peripheral artery disease) 11/08/2019   Paroxysmal atrial fibrillation (HCC) 09/02/2019   Stiffness of finger joint of left hand 04/06/2019   History of COVID-19 01/20/2019   Severe persistent asthma without complication 01/20/2019   Medication management 09/20/2018   Stage 3 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 (HCC) 09/20/2018   Increased eosinophils in the blood 06/21/2018   Diffusion capacity of lung (dl), decreased 98/69/7979   COPD with acute exacerbation (HCC) 06/22/2017   Atrial flutter, paroxysmal (HCC) 10/17/2016   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/2016   SVT (supraventricular tachycardia) (HCC) 10/17/2016   Ulcerative colitis (HCC) 05/28/2016   Cervical stenosis of spinal canal 05/05/2016   Carotid stenosis 05/02/2016   Atherosclerosis of native arteries of extremity with intermittent claudication (HCC) 05/02/2016   Cramping of hands 01/10/2016   Bilateral carpal tunnel syndrome 12/06/2015   Centrilobular Emphysema 04/25/2014   Former smoker 04/25/2014   Tobacco abuse 04/25/2014    Social History   Tobacco Use   Smoking status: Former    Current packs/day: 0.00    Average packs/day: 0.5 packs/day for 35.0 years (17.5 ttl pk-yrs)    Types: Cigarettes, Cigars    Start date: 06/10/1976    Quit date: 06/11/2011    Years since quitting: 11.7   Smokeless tobacco: Never   Tobacco comments:    Former  smoker 12/19/2020  Substance Use Topics   Alcohol  use: Yes    Alcohol /week: 6.0 standard drinks of alcohol     Types: 6 Shots of liquor per week    Comment: wine, liquor, or beer    Allergies  Allergen Reactions   Penicillins Rash    Has patient had a PCN reaction causing immediate rash, facial/tongue/throat swelling, SOB or lightheadedness with hypotension:Yes Has patient had a PCN reaction causing severe rash involving mucus membranes or skin necrosis:Yes Has patient had a PCN reaction that required hospitalization:No Has patient had a PCN reaction occurring within the last 10 years:No If all of the above answers are NO, then may proceed with Cephalosporin use.     Current Meds  Medication Sig   Azelastine  HCl 137 MCG/SPRAY SOLN Place 1 spray into both nostrils 2 (two) times daily.   diltiazem  (CARDIZEM  CD) 120 MG 24 hr capsule Take 1 capsule (120 mg total) by mouth daily.   dupilumab  (DUPIXENT ) 300 MG/2ML prefilled syringe Inject 300 mg into the skin every 14 (fourteen) days.   flecainide  (TAMBOCOR ) 50 MG tablet Take 1 tablet (50 mg total) by mouth 2 (two) times daily.   furosemide  (LASIX ) 20 MG tablet TAKE 1 TABLET BY MOUTH DAILY AS NEEDED (FOR SWELLING OR WEIGHT GAIN OF 3 LBS OVERNIGHT OR 5 LBS IN A WEEK). (Patient taking differently: Take 20 mg by mouth daily as needed for fluid or edema.)   hydrocortisone  (ANUSOL -HC) 25 MG suppository Place 1 suppository (25 mg total) rectally 2 (two) times daily.   mesalamine  (LIALDA ) 1.2 g  EC tablet TAKE 4 TABLETS BY MOUTH DAILY WITH BREAKFAST (Patient taking differently: Take 2.4 g by mouth daily with breakfast.)   mometasone -formoterol  (DULERA ) 200-5 MCG/ACT AERO Inhale 2 puffs into the lungs 2 (two) times daily. Rinse mouth well after use.   nitroGLYCERIN  (NITROSTAT ) 0.4 MG SL tablet Place 1 tablet (0.4 mg total) under the tongue every 5 (five) minutes as needed for chest pain.   OHTUVAYRE  3 MG/2.5ML SUSP Inhale 3 mg into the lungs in the  morning and at bedtime.   ondansetron  (ZOFRAN -ODT) 4 MG disintegrating tablet Take 1 tablet (4 mg total) by mouth every 8 (eight) hours as needed for nausea or vomiting.   Rivaroxaban  (XARELTO ) 15 MG TABS tablet Take 1 tablet (15 mg total) by mouth daily at 6 (six) AM.   rosuvastatin  (CRESTOR ) 10 MG tablet TAKE 1 TABLET BY MOUTH DAILY   Vitamin D , Ergocalciferol , (DRISDOL ) 1.25 MG (50000 UNIT) CAPS capsule Take 1 capsule (50,000 Units total) by mouth every 7 (seven) days.    Immunization History  Administered Date(s) Administered   Fluad Quad(high Dose 65+) 11/01/2020, 10/29/2021   Fluad Trivalent(High Dose 65+) 10/16/2022   Influenza Inj Mdck Quad Pf 10/28/2017   Influenza Split 11/24/2013   Influenza, High Dose Seasonal PF 11/05/2016, 10/29/2017, 10/06/2018, 11/03/2019   Influenza,inj,Quad PF,6+ Mos 11/08/2014, 11/26/2015   Influenza-Unspecified 10/11/2013, 10/11/2013   PFIZER(Purple Top)SARS-COV-2 Vaccination 05/06/2019, 05/31/2019, 01/27/2020   PNEUMOCOCCAL CONJUGATE-20 06/03/2022   Pneumococcal Conjugate-13 04/18/2014   Pneumococcal Polysaccharide-23 11/26/2015   Respiratory Syncytial Virus Vaccine,Recomb Aduvanted(Arexvy) 10/14/2022   Tdap 02/13/2011   Zoster Recombinant(Shingrix) 12/01/2016, 03/02/2017   Zoster, Live 11/07/2010        Objective:   BP 132/66 (BP Location: Left Arm, Patient Position: Sitting, Cuff Size: Normal)   Pulse (!) 59   Temp 97.6 F (36.4 C) (Temporal)   Ht 5' 10 (1.778 m)   Wt 132 lb 9.6 oz (60.1 kg)   SpO2 95%   BMI 19.03 kg/m   SpO2: 95 %  GENERAL: Thin well-developed gentleman, no acute distress.  Well-groomed, fully ambulatory without assistance.  No conversational dyspnea. HEAD: Normocephalic, atraumatic.  EYES: Pupils equal, round, reactive to light.  No scleral icterus.  MOUTH: Dentition intact.  Oral mucosa moist.  No thrush. NECK: Supple. No thyromegaly. Trachea midline. No JVD.  No adenopathy. PULMONARY: Distant breath sounds  bilaterally.  Coarse breath sounds, no wheezes or rhonchi noted. CARDIOVASCULAR: S1 and S2.  Regular rate and rhythm.  No rubs, murmurs or gallops heard. ABDOMEN: Benign. MUSCULOSKELETAL: No joint deformity, no clubbing, no edema.  Muscle tone improved.   NEUROLOGIC: No overt focal deficit, no gait disturbance, speech is fluent. SKIN: Intact,warm,dry. PSYCH: Mood and behavior normal.       Assessment & Plan:     ICD-10-CM   1. Stage 3 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 (HCC)  J44.9     2. Severe persistent asthma without complication  J45.50     3. Nocturnal hypoxemia due to emphysema [facility])  J43.9    G47.36      Discussion:    Asthma-COPD Overlap Syndrome (ACOS) Shown Dissinger presents for a follow-up visit for severe ACOS. He reports variable breathing difficulties, with some days being better than others. He is currently on Ohtuvayre , which he finds effective and continues on Dupixent  which controls his asthma component well. He uses Dulera  in the morning and at night, though he has experienced urinary retention with Spiriva  and other LAMA's. He has a rescue inhaler (albuterol ) available.  Recently, he had a cold with persistent cough and congestion, managed with Mucinex  and ginger tea.  - Continue Ohtuvayre  - Continue Dulera  twice a day - Use rescue inhaler (albuterol ) as needed - Schedule follow-up in six months  Nocturnal Hypoxemia due to Emphysema Compliant with nocturnal oxygen  supplementation.  Notes benefit of therapy - Continue oxygen  at 2 L/min nocturnally  Recent Upper Respiratory Infection Curlee Glisson experienced a recent upper respiratory infection with symptoms of cough and congestion, managed with Mucinex  and ginger tea. - Continue using Mucinex  as needed - Provide samples of ginger tea for symptom relief  Follow-up - Schedule follow-up appointment in six months - Advise to call if any issues arise before the next appointment.     Advised if symptoms do  not improve or worsen, to please contact office for sooner follow up or seek emergency care.    I spent 40 minutes of dedicated to the care of this patient on the date of this encounter to include pre-visit review of records, face-to-face time with the patient discussing conditions above, post visit ordering of testing, clinical documentation with the electronic health record, making appropriate referrals as documented, and communicating necessary findings to members of the patients care team.   C. Leita Sanders, MD Advanced Bronchoscopy PCCM [facility]    *This note was generated using voice recognition software/Dragon and/or AI transcription program.  Despite best efforts to proofread, errors can occur which can change the meaning. Any transcriptional errors that result from this process are unintentional and may not be fully corrected at the time of dictation.

## 2023-03-30 DIAGNOSIS — H524 Presbyopia: Secondary | ICD-10-CM | POA: Diagnosis not present

## 2023-03-30 DIAGNOSIS — H04123 Dry eye syndrome of bilateral lacrimal glands: Secondary | ICD-10-CM | POA: Diagnosis not present

## 2023-03-30 DIAGNOSIS — H5319 Other subjective visual disturbances: Secondary | ICD-10-CM | POA: Diagnosis not present

## 2023-03-30 DIAGNOSIS — H35363 Drusen (degenerative) of macula, bilateral: Secondary | ICD-10-CM | POA: Diagnosis not present

## 2023-03-30 DIAGNOSIS — H0100B Unspecified blepharitis left eye, upper and lower eyelids: Secondary | ICD-10-CM | POA: Diagnosis not present

## 2023-03-30 DIAGNOSIS — H0100A Unspecified blepharitis right eye, upper and lower eyelids: Secondary | ICD-10-CM | POA: Diagnosis not present

## 2023-03-31 ENCOUNTER — Encounter: Payer: Self-pay | Admitting: Cardiology

## 2023-03-31 ENCOUNTER — Ambulatory Visit: Payer: PPO | Attending: Cardiology | Admitting: Cardiology

## 2023-03-31 VITALS — BP 138/60 | HR 57 | Ht 70.0 in | Wt 136.0 lb

## 2023-03-31 DIAGNOSIS — I2510 Atherosclerotic heart disease of native coronary artery without angina pectoris: Secondary | ICD-10-CM

## 2023-03-31 DIAGNOSIS — I480 Paroxysmal atrial fibrillation: Secondary | ICD-10-CM

## 2023-03-31 DIAGNOSIS — J449 Chronic obstructive pulmonary disease, unspecified: Secondary | ICD-10-CM | POA: Diagnosis not present

## 2023-03-31 DIAGNOSIS — I739 Peripheral vascular disease, unspecified: Secondary | ICD-10-CM | POA: Diagnosis not present

## 2023-03-31 DIAGNOSIS — E782 Mixed hyperlipidemia: Secondary | ICD-10-CM

## 2023-03-31 DIAGNOSIS — I10 Essential (primary) hypertension: Secondary | ICD-10-CM | POA: Diagnosis not present

## 2023-03-31 NOTE — Patient Instructions (Signed)
Medication Instructions:  Your Physician recommend you continue on your current medication as directed.    *If you need a refill on your cardiac medications before your next appointment, please call your pharmacy*   Lab Work: None ordered at this time    Follow-Up: At [facility], you and your health needs are our priority.  As part of our continuing mission to provide you with exceptional heart care, we have created designated Provider Care Teams.  These Care Teams include your primary Cardiologist (physician) and Advanced Practice Providers (APPs -  Physician Assistants and Nurse Practitioners) who all work together to provide you with the care you need, when you need it.   Your next appointment:   In June   Provider:   You may see Yvonne Kendall, MD or one of the following Advanced Practice Providers on your designated Care Team:   Nicolasa Ducking, NP Eula Listen, PA-C Cadence Fransico Michael, PA-C Charlsie Quest, NP Carlos Levering, NP

## 2023-03-31 NOTE — Progress Notes (Signed)
Cardiology Office Note:  .   Date:  03/31/2023  ID:  Miro, Balderson 12-27-1945, MRN [id number] PCP: Dana Allan, MD  [facility] Providers Cardiologist:  Yvonne Kendall, MD Electrophysiologist:  Lanier Prude, MD    History of Present Illness: .   Antonio [name] is a 78 y.o. male with a past medical history of paroxysmal atrial fibrillation/flutter on rivaroxaban, nonobstructive coronary disease, severe COPD, PSVT, PAD status post left SFA stenting, hypertension, hyperlipidemia, ulcerative colitis, BPH, who returns today for follow-up of his nonobstructive coronary artery disease.   Echocardiogram completed January 2020 showed normal LV function.  Stress test in May 2023 in the setting of chest tightness revealed normal LV function without ischemia or infarct.  Coronary calcium was noted on scans.  In January 2021 for this any recurrent chest discomfort decision was made to pursue an coronary CTA which showed mild nonobstructive CAD.  Paroxysmal atrial fibrillation/atrial flutter was continued to be followed by EP.  It was felt he was not a candidate for advanced procedures secondary to severe lung disease.  He was continued to be managed on flecainide and rivaroxaban.  Earlier in the year his metoprolol dosing was reduced secondary to baseline bradycardia with limited dosing adjustments.  On 05/19/22 with symptoms of mild dyspnea, malaise, pleuritic back pain which progressed throughout the week became associated with chest tightness and worsening dyspnea.  On 05/22/2022 presenting [facility] emergency department send to be in atrial fibrillation with RVR with rates of 170s to 180s.  He had a fever 102.5, white count of 13,000, lactic acid elevated 2.7.  High-sensitivity troponin was mildly elevated with a peak of 34.  He was placed on IV diltiazem as well as antibiotics for management of pneumonia and sepsis and was admitted for continued evaluation.  He converted to sinus rhythm  and was maintained on IV diltiazem.  Home doses of flecainide and metoprolol were continued.  He was continued on rate control with diltiazem and flecainide and metoprolol was discontinued.  He was considered stable and subsequently discharged on 05/28/2022.   He was last seen in clinic 02/18/2023 by S. Riddle, NP from EP.  At that time he was doing well from the A-fib standpoint.  Did not had any recent palpitations episode, and not needed as needed metoprolol at all.  Recently ran out of his diltiazem but continue to take 50 mg of flecainide twice daily.  Previously prescribed 75 mg twice daily but is unable to cut the pills in half and due to small size of the pills.  He was continued on his rivaroxaban and had had several bleeding hemorrhoids lately that was being treated with banding.  His Cardizem of 120 mg daily was refilled and he was advised to take that at night.  His flecainide was decreased to 50 mg twice daily as previous prescription was 75 mg twice daily which he was unable to cut the pills.   He returns to clinic today accompanied by his wife. He has been doing well from the cardiac perspective. Denies any chest pain, palpitations, or peripheral edema, Continues to suffer from chronic dyspnea. Recently was seen by EP and Pulmonary.  Continuing to be compliant with his current medication regimen.  Denies any bleeding with increased bleeding noted in his bladder or stool.  He does have hemorrhoids that were banded which has reduced bleeding.  Recently had breathing medication changed by pulmonary.  Denies any recent hospitalization or visits to the emergency department.  ROS: 10 point review of systems has been reviewed and considered negative with exception was been listed in the HPI  Studies Reviewed: Marland Kitchen   EKG Interpretation Date/Time:  Tuesday March 31 2023 11:18:50 EST Ventricular Rate:  57 PR Interval:  178 QRS Duration:  84 QT Interval:  404 QTC Calculation: 393 R  Axis:   105  Text Interpretation: Sinus bradycardia with marked sinus arrhythmia Rightward axis Pulmonary disease pattern When compared with ECG of 18-Feb-2023 09:37, No significant change was found Confirmed by Charlsie Quest ([id number]) on 03/31/2023 11:36:01 AM    Coronary CTA 03/06/22 IMPRESSION: 1. Coronary calcium score of 251. This was 42nd percentile for age and sex matched control.   2. Normal coronary origin with left dominance.   3. Mild distal LM and proximal LAD stenosis (25-49%)   4. CAD-RADS 2. Mild non-obstructive CAD (25-49%). Consider non-atherosclerotic causes of chest pain. Consider preventive therapy and risk factor modification.   TTE 03/11/21 1. Left ventricular ejection fraction, by estimation, is 55%. Left  ventricular ejection fraction by 2D MOD biplane is 54.2 %. The left  ventricle has normal function. The left ventricle has no regional wall  motion abnormalities. Left ventricular  diastolic parameters were normal.   2. Right ventricular systolic function is normal. The right ventricular  size is normal.   3. The mitral valve is normal in structure. No evidence of mitral valve  regurgitation.   4. The aortic valve is tricuspid. Aortic valve regurgitation is not  visualized.   5. The inferior vena cava is normal in size with greater than 50%  respiratory variability, suggesting right atrial pressure of 3 mmHg.  Risk Assessment/Calculations:    CHA2DS2-VASc Score = 4   This indicates a 4.8% annual risk of stroke. The patient's score is based upon: CHF History: 0 HTN History: 1 Diabetes History: 0 Stroke History: 0 Vascular Disease History: 1 Age Score: 2 Gender Score: 0            Physical Exam:   VS:  BP 138/60   Pulse (!) 57   Ht 5\' 10"  (1.778 m)   Wt 136 lb (61.7 kg)   SpO2 95%   BMI 19.51 kg/m    Wt Readings from Last 3 Encounters:  03/31/23 136 lb (61.7 kg)  03/19/23 132 lb 9.6 oz (60.1 kg)  03/03/23 135 lb (61.2 kg)    GEN: Well  nourished, well developed in no acute distress NECK: No JVD; No carotid bruits CARDIAC: RRR, no murmurs, rubs, gallops RESPIRATORY:  Clear with slightly diminished bases to auscultation without rales, wheezing or rhonchi respirations are unlabored at rest on room air ABDOMEN: Soft, non-tender, non-distended EXTREMITIES: Trace pretibial edema; No deformity   ASSESSMENT AND PLAN: .   Paroxysmal atrial fibrillation/atrial flutter that is sinus bradycardia with sinus arrhythmia today with a rate of 57, with no acute changes.  He is continued on rivaroxaban 15 mg daily for CHA2DS2-VASc score of at least 4 for stroke prophylaxis.  He is also continued on diltiazem 20 mg daily flecainide 50 mg twice daily and as needed metoprolol to tartrate.  He continues to follow-up with EP but has been maintaining sinus rhythm.  Nonobstructive coronary artery disease of native coronary arteries without angina.  EKG reveals sinus rhythm with sinus arrhythmia with no ischemic changes.  Coronary CTA in January 2021 showed nonobstructive CAD.  He is continued on rivaroxaban and lieu of aspirin and rosuvastatin 10 mg daily.  Primary hypertension with blood pressure today  138/60.  Blood pressures remain stable.  He is continued on diltiazem and as needed metoprolol.  He has been encouraged to continue to monitor his blood pressures 1 to 2 hours postmedication administration as well.    Mixed hyperlipidemia with last EF of 41.  He continues to remain at goal.  He is continued on rosuvastatin 10 mg daily.  Peripheral arterial disease with occasional claudication.  He is continued on rosuvastatin 10 mg daily rivaroxaban and lieu of aspirin.  His ongoing management per vascular.  Severe COPD where he continues to follow with Dr. Jayme Cloud.  Inhalers and nebulizers have been changed and he has oxygen at 2 L for nocturnal hypoxia and emphysema.       Dispo: Patient return to clinic to see MD/APP in 4 months or sooner if  needed  Signed, Asherah Lavoy, NP

## 2023-03-31 NOTE — Telephone Encounter (Signed)
Spoke with patient and he stated he turned in the renewal PAP to [place]. He states he received one shipment already. We did not receive conformation, however patient states he is confident that he has been approved until 02/10/2024.

## 2023-04-12 ENCOUNTER — Other Ambulatory Visit: Payer: Self-pay | Admitting: Internal Medicine

## 2023-04-15 ENCOUNTER — Ambulatory Visit (INDEPENDENT_AMBULATORY_CARE_PROVIDER_SITE_OTHER): Payer: PPO

## 2023-04-15 ENCOUNTER — Encounter (INDEPENDENT_AMBULATORY_CARE_PROVIDER_SITE_OTHER): Payer: Self-pay | Admitting: Nurse Practitioner

## 2023-04-15 ENCOUNTER — Ambulatory Visit (INDEPENDENT_AMBULATORY_CARE_PROVIDER_SITE_OTHER): Payer: PPO | Admitting: Nurse Practitioner

## 2023-04-15 VITALS — BP 151/83 | HR 60 | Resp 18 | Ht 70.0 in | Wt 135.4 lb

## 2023-04-15 DIAGNOSIS — I6523 Occlusion and stenosis of bilateral carotid arteries: Secondary | ICD-10-CM

## 2023-04-15 DIAGNOSIS — I70212 Atherosclerosis of native arteries of extremities with intermittent claudication, left leg: Secondary | ICD-10-CM

## 2023-04-15 DIAGNOSIS — I10 Essential (primary) hypertension: Secondary | ICD-10-CM

## 2023-04-17 LAB — VAS US ABI WITH/WO TBI
Left ABI: 0.73
Right ABI: 1.19

## 2023-04-17 NOTE — Progress Notes (Signed)
 Subjective:    Patient ID: Antonio [name], male    DOB: [birth year], 78 y.o.   MRN: [id number] Chief Complaint  Patient presents with   Follow-up    1 year carotid + ABI    Patient returns today in follow up of multiple vascular issues.  His claudication symptoms are doing reasonably well.  He is more limited by his breathing issues than anything.  Claudication symptoms on the left lower extremity are fairly mild.  He has no new open wounds, infection or symptoms worrisome for rest pain.  We also follow his carotid disease.  He currently has no focal neurological symptoms.  Today the patient has 1 to 39% stenosis of the bilateral internal carotid arteries.  Normal flow hemodynamics in the bilateral subclavian arteries.  Antegrade flow in the bilateral vertebral arteries.  Today the patient has ABI of 1.19 on the right and 0.73 on the left.  The ABI remains unchanged on the right and slightly improved on the left.  There are biphasic tibial artery waveforms on the right with monophasic on the left.  Slightly dampened toe waveforms on the left.     Review of Systems  All other systems reviewed and are negative.      Objective:   Physical Exam Vitals reviewed.  Cardiovascular:     Rate and Rhythm: Normal rate.     Pulses:          Dorsalis pedis pulses are detected w/ Doppler on the right side and detected w/ Doppler on the left side.       Posterior tibial pulses are detected w/ Doppler on the right side and detected w/ Doppler on the left side.  Pulmonary:     Effort: Pulmonary effort is normal.  Skin:    General: Skin is warm and dry.  Neurological:     Mental Status: He is alert and oriented to person, place, and time.  Psychiatric:        Mood and Affect: Mood normal.        Behavior: Behavior normal.        Thought Content: Thought content normal.        Judgment: Judgment normal.     BP (!) 151/83   Pulse 60   Resp 18   Ht 5\' 10"  (1.778 m)   Wt 135 lb 6.4 oz  (61.4 kg)   BMI 19.43 kg/m   Past Medical History:  Diagnosis Date   Allergy    Calf pain    COPD (chronic obstructive pulmonary disease) (HCC)    no Oxygen per pt   COVID-19    Dyspnea    Essential hypertension    History of echocardiogram    a. 02/2021 Echo: EF 55%, no rwma, nl RV fxn.   Hypokalemia    Internal hemorrhoids    Mixed hyperlipidemia    Muscle cramp    Muscle pain    Nonobstructive CAD (coronary artery disease)    a. 06/2021 MV: EF 65%, no isch/infarct,Cor Ca2+; b. 02/2022 Cor CTA: LM 25, LAD 25-49, LCX nl, OM1/2 nl, RCA nondom, nl. Ca2+ = 252 (42nd%'ile).   PAD (peripheral artery disease) (HCC)    a. s/p L SFA stenting.   Paroxysmal atrial fibrillation (HCC) 08/2019   a. CHA2DS2VASc = 4-->xarelto/flecainide.   Pneumonia    PONV (postoperative nausea and vomiting)    Tubular adenoma of colon    Ulcerative colitis [facility])     Social History   Socioeconomic  History   Marital status: Married    Spouse name: Not on file   Number of children: 2   Years of education: Not on file   Highest education level: Bachelor's degree (e.g., BA, AB, BS)  Occupational History   Occupation: retired  Tobacco Use   Smoking status: Former    Current packs/day: 0.00    Average packs/day: 0.5 packs/day for 35.0 years (17.5 ttl pk-yrs)    Types: Cigarettes, Cigars    Start date: 06/10/1976    Quit date: 06/11/2011    Years since quitting: 11.8   Smokeless tobacco: Never   Tobacco comments:    Former smoker 12/19/2020  Vaping Use   Vaping status: Never Used  Substance and Sexual Activity   Alcohol use: Yes    Alcohol/week: 6.0 standard drinks of alcohol    Types: 6 Shots of liquor per week    Comment: wine, liquor, or beer   Drug use: No   Sexual activity: Yes  Other Topics Concern   Not on file  Social History Narrative   Not on file   Social Drivers of Health   Financial Resource Strain: Patient Declined (02/27/2023)   Overall Financial Resource Strain (CARDIA)     Difficulty of Paying Living Expenses: Patient declined  Food Insecurity: No Food Insecurity (02/27/2023)   Hunger Vital Sign    Worried About Running Out of Food in the Last Year: Never true    Ran Out of Food in the Last Year: Never true  Transportation Needs: No Transportation Needs (02/27/2023)   PRAPARE - Administrator, Civil Service (Medical): No    Lack of Transportation (Non-Medical): No  Physical Activity: Sufficiently Active (02/27/2023)   Exercise Vital Sign    Days of Exercise per Week: 5 days    Minutes of Exercise per Session: 30 min  Stress: Stress Concern Present (02/27/2023)   [employer] of Occupational Health - Occupational Stress Questionnaire    Feeling of Stress : Very much  Social Connections: Socially Integrated (02/27/2023)   Social Connection and Isolation Panel [NHANES]    Frequency of Communication with Friends and Family: More than three times a week    Frequency of Social Gatherings with Friends and Family: More than three times a week    Attends Religious Services: More than 4 times per year    Active Member of Clubs or Organizations: Yes    Attends Banker Meetings: More than 4 times per year    Marital Status: Married  Catering manager Violence: Not At Risk (05/22/2022)   Humiliation, Afraid, Rape, and Kick questionnaire    Fear of Current or Ex-Partner: No    Emotionally Abused: No    Physically Abused: No    Sexually Abused: No    Past Surgical History:  Procedure Laterality Date   ANTERIOR CERVICAL DECOMP/DISCECTOMY FUSION N/A 05/05/2016   Procedure: ANTERIOR CERVICAL DECOMPRESSION FUSION CERVICAL THREE-FOUR.;  Surgeon: Julio Sicks, MD;  Location: [facility] OR;  Service: Neurosurgery;  Laterality: N/A;  right side approach   CATARACT EXTRACTION Bilateral    CERVICAL DISCECTOMY  04/2016   C3-C4   COLONOSCOPY     COLONOSCOPY  08/09/2015   HEMORRHOID SURGERY  1988   leg stent Left approx 5-6 yrs ago   2 to left leg    MENISCUS REPAIR Left 12/2013   MOHS SURGERY     POLYPECTOMY     ROTATOR CUFF REPAIR Left 2007   TONSILLECTOMY AND ADENOIDECTOMY  1957  Family History  Problem Relation Age of Onset   Cancer Mother        leukemia   Colon polyps Mother    Heart disease Father    Heart attack Father 54   Healthy Daughter    Healthy Son    Colon cancer Neg Hx    Esophageal cancer Neg Hx    Rectal cancer Neg Hx    Stomach cancer Neg Hx     Allergies  Allergen Reactions   Penicillins Rash    Has patient had a PCN reaction causing immediate rash, facial/tongue/throat swelling, SOB or lightheadedness with hypotension:Yes Has patient had a PCN reaction causing severe rash involving mucus membranes or skin necrosis:Yes Has patient had a PCN reaction that required hospitalization:No Has patient had a PCN reaction occurring within the last 10 years:No If all of the above answers are "NO", then may proceed with Cephalosporin use.        Latest Ref Rng & Units 03/12/2023    9:01 AM 10/16/2022    2:47 PM 05/27/2022    4:12 AM  CBC  WBC 4.0 - 10.5 K/uL 8.2  8.7  10.1   Hemoglobin 13.0 - 17.0 g/dL 96.2  95.2  84.1   Hematocrit 39.0 - 52.0 % 40.2  40.7  35.6   Platelets 150.0 - 400.0 K/uL 207.0  161  223       CMP     Component Value Date/Time   NA 142 03/12/2023 0901   NA 140 10/16/2022 1447   NA 138 10/01/2013 2131   K 4.3 03/12/2023 0901   K 4.2 10/01/2013 2131   CL 105 03/12/2023 0901   CL 107 10/01/2013 2131   CO2 28 03/12/2023 0901   CO2 23 10/01/2013 2131   GLUCOSE 90 03/12/2023 0901   GLUCOSE 118 (H) 10/01/2013 2131   BUN 9 03/12/2023 0901   BUN 9 10/16/2022 1447   BUN 11 10/01/2013 2131   CREATININE 0.88 03/12/2023 0901   CREATININE 0.93 10/01/2013 2131   CALCIUM 8.9 03/12/2023 0901   CALCIUM 8.7 10/01/2013 2131   PROT 6.8 03/12/2023 0901   PROT 7.6 12/14/2019 0931   ALBUMIN 4.1 03/12/2023 0901   ALBUMIN 4.4 10/16/2022 1447   AST 19 03/12/2023 0901   ALT 14 03/12/2023  0901   ALKPHOS 66 03/12/2023 0901   BILITOT 0.5 03/12/2023 0901   BILITOT 0.8 12/14/2019 0931   GFRNONAA >60 05/28/2022 1117   GFRNONAA >60 10/01/2013 2131   GFRAA 91 12/14/2019 0931   GFRAA >60 10/01/2013 2131     VAS [place] ABI WITH/WO TBI  Result Date: 03/10/2022  LOWER EXTREMITY DOPPLER STUDY Patient Name:  Rollie Hynek  Date of Exam:   03/05/2022 Medical Rec #: [id number]              Accession #:    [id number] Date of Birth: 24-May-1945              Patient Gender: M Patient Age:   11 years Exam Location:  [facility] Procedure:      VAS [place] ABI WITH/WO TBI Referring Phys: Festus Barren --------------------------------------------------------------------------------  Indications: Peripheral artery disease.  Performing Technologist: Debbe Bales RVS  Examination Guidelines: A complete evaluation includes at minimum, Doppler waveform signals and systolic blood pressure reading at the level of bilateral brachial, anterior tibial, and posterior tibial arteries, when vessel segments are accessible. Bilateral testing is considered an integral part of a complete examination. Photoelectric Plethysmograph (PPG) waveforms  and toe systolic pressure readings are included as required and additional duplex testing as needed. Limited examinations for reoccurring indications may be performed as noted.  ABI Findings: +---------+------------------+-----+--------+--------+ Right    Rt Pressure (mmHg)IndexWaveformComment  +---------+------------------+-----+--------+--------+ Brachial 128                                     +---------+------------------+-----+--------+--------+ ATA      146               1.14 biphasic         +---------+------------------+-----+--------+--------+ PTA      118               0.92 biphasic         +---------+------------------+-----+--------+--------+ Great Toe88                0.69 Normal            +---------+------------------+-----+--------+--------+ +---------+------------------+-----+----------+-------+ Left     Lt Pressure (mmHg)IndexWaveform  Comment +---------+------------------+-----+----------+-------+ Brachial 124                                      +---------+------------------+-----+----------+-------+ ATA      72                0.56 monophasic        +---------+------------------+-----+----------+-------+ PERO     79                0.62 monophasic        +---------+------------------+-----+----------+-------+ Great Toe61                0.48 Abnormal          +---------+------------------+-----+----------+-------+ +-------+-----------+-----------+------------+------------+ ABI/TBIToday's ABIToday's TBIPrevious ABIPrevious TBI +-------+-----------+-----------+------------+------------+ Right  1.14       .69        1.14        .73          +-------+-----------+-----------+------------+------------+ Left   .62        .48        .55         .40          +-------+-----------+-----------+------------+------------+ Left ABIs and TBIs appear increased compared to prior study on 03/05/2021. Right ABIs and TBIs appear essentially unchanged compared to prior study on 03/05/2021.  Summary: Right: Resting right ankle-brachial index is within normal range. The right toe-brachial index is normal. Left: Resting left ankle-brachial index indicates moderate left lower extremity arterial disease. The left toe-brachial index is abnormal. *See table(s) above for measurements and observations.   Electronically signed by Festus Barren MD on 03/10/2022 at 8:22:45 AM.    Final        Assessment & Plan:   1. 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  With minimal symptoms, we will still not plan any intervention at this time.  We will  check him in 1 year.  He will contact our office if symptoms worsen.  Continue current medical regimen.   2. Bilateral carotid artery stenosis Recommend:  Given the patient's asymptomatic subcritical stenosis no further invasive testing or surgery at this time.  Duplex ultrasound shows <40% stenosis bilaterally.  Continue antiplatelet therapy as prescribed Continue management  of CAD, HTN and Hyperlipidemia Healthy heart diet,  encouraged exercise at least 4 times per week Follow up in 12 months with duplex ultrasound and physical exam   3. Pulmonary emphysema, unspecified emphysema type (HCC) Continue pulmonary medications and aerosols as already ordered, these medications have been reviewed and there are no changes at this time.    Current Outpatient Medications on File Prior to Visit  Medication Sig Dispense Refill   Azelastine HCl 137 MCG/SPRAY SOLN Place 1 spray into both nostrils 2 (two) times daily. 30 mL 6   diltiazem (CARDIZEM CD) 120 MG 24 hr capsule Take 1 capsule (120 mg total) by mouth daily. 90 capsule 0   dupilumab (DUPIXENT) 300 MG/2ML prefilled syringe Inject 300 mg into the skin every 14 (fourteen) days. 12 mL 1   flecainide (TAMBOCOR) 50 MG tablet Take 1 tablet (50 mg total) by mouth 2 (two) times daily. 180 tablet 0   furosemide (LASIX) 20 MG tablet TAKE 1 TABLET BY MOUTH DAILY AS NEEDED (FOR SWELLING OR WEIGHT GAIN OF 3 LBS OVERNIGHT OR 5 LBS IN A WEEK). (Patient taking differently: Take 20 mg by mouth daily as needed for fluid or edema.) 90 tablet 1   hydrocortisone (ANUSOL-HC) 25 MG suppository Place 1 suppository (25 mg total) rectally 2 (two) times daily. 28 suppository 1   mesalamine (LIALDA) 1.2 g EC tablet TAKE 4 TABLETS BY MOUTH DAILY WITH BREAKFAST (Patient taking differently: Take 2.4 g by mouth daily with breakfast.) 120 tablet 0   mometasone-formoterol (DULERA) 200-5 MCG/ACT AERO Inhale 2 puffs into the lungs 2 (two) times daily. Rinse mouth well after use.  39 g 3   nitroGLYCERIN (NITROSTAT) 0.4 MG SL tablet Place 1 tablet (0.4 mg total) under the tongue every 5 (five) minutes as needed for chest pain. 25 tablet 3   OHTUVAYRE 3 MG/2.5ML SUSP Inhale 3 mg into the lungs in the morning and at bedtime.     ondansetron (ZOFRAN-ODT) 4 MG disintegrating tablet Take 1 tablet (4 mg total) by mouth every 8 (eight) hours as needed for nausea or vomiting. 30 tablet 2   Rivaroxaban (XARELTO) 15 MG TABS tablet Take 1 tablet (15 mg total) by mouth daily at 6 (six) AM. 90 tablet 0   rosuvastatin (CRESTOR) 10 MG tablet TAKE 1 TABLET BY MOUTH DAILY 90 tablet 3   Vitamin D, Ergocalciferol, (DRISDOL) 1.25 MG (50000 UNIT) CAPS capsule Take 1 capsule (50,000 Units total) by mouth every 7 (seven) days. 12 capsule 1   Current Facility-Administered Medications on File Prior to Visit  Medication Dose Route Frequency Provider Last Rate Last Admin   albuterol (PROVENTIL) (2.5 MG/3ML) 0.083% nebulizer solution 2.5 mg  2.5 mg Nebulization Once Salena Saner, MD        There are no Patient Instructions on file for this visit. Return in about 1 year (around 04/14/2024) for Carotid stenosis, PAD.   Georgiana Spinner, NP

## 2023-04-23 ENCOUNTER — Telehealth: Payer: Self-pay | Admitting: Pulmonary Disease

## 2023-04-23 DIAGNOSIS — J4550 Severe persistent asthma, uncomplicated: Secondary | ICD-10-CM

## 2023-04-23 NOTE — Telephone Encounter (Signed)
 Patient states needs prior authorization for Dupixent. Patient phone number is [phone].

## 2023-04-24 ENCOUNTER — Encounter: Payer: Self-pay | Admitting: Pulmonary Disease

## 2023-04-24 NOTE — Telephone Encounter (Signed)
 Submitted a Prior Authorization request to [facility] ADVANTAGE/RX ADVANCE for DUPIXENT via CoverMyMeds. Will update once we receive a response.  Key: [id number]   Of note, patient sent MyChart stating that he is DENIED patient assistance (though we spoke with him on 03/31/2023 and he was confident he was re-approved)  Chesley Mires, PharmD, MPH, BCPS, CPP Clinical Pharmacist (Rheumatology and Pulmonology)

## 2023-04-24 NOTE — Telephone Encounter (Signed)
 Health team advantage is asking for a call to [phone] option 2 concerning the PA

## 2023-04-27 ENCOUNTER — Other Ambulatory Visit (HOSPITAL_COMMUNITY): Payer: Self-pay

## 2023-04-27 DIAGNOSIS — L700 Acne vulgaris: Secondary | ICD-10-CM | POA: Diagnosis not present

## 2023-04-27 MED ORDER — DUPIXENT 300 MG/2ML ~~LOC~~ SOSY
300.0000 mg | PREFILLED_SYRINGE | SUBCUTANEOUS | 1 refills | Status: DC
  Filled 2023-05-04: qty 12, 84d supply, fill #0
  Filled 2023-07-20 – 2023-07-22 (×2): qty 12, 84d supply, fill #1

## 2023-04-27 NOTE — Telephone Encounter (Signed)
 Spoke with pt - advised that pharmacy will reach out for shipment and [facility] will reach out regarding first shipment

## 2023-04-27 NOTE — Telephone Encounter (Signed)
 Received notification from [facility] ADVANTAGE/RX ADVANCE regarding a prior authorization for DUPIXENT. Authorization has been APPROVED from 04/24/2023 to 10/25/2023. Approval letter sent to scan center.  Per test claim, copay for 28 days supply is $0  Patient can fill through [facility]: [phone]   Authorization # [id number]  ATC patient regarding Dupixent. Rx sent to [facility]. Left VM requesting return call for when next dose is due  Chesley Mires, PharmD, MPH, BCPS, CPP Clinical Pharmacist (Rheumatology and Pulmonology)

## 2023-04-29 DIAGNOSIS — J4550 Severe persistent asthma, uncomplicated: Secondary | ICD-10-CM

## 2023-04-29 DIAGNOSIS — J449 Chronic obstructive pulmonary disease, unspecified: Secondary | ICD-10-CM

## 2023-04-29 MED ORDER — METHYLPREDNISOLONE 4 MG PO TBPK: ORAL_TABLET | ORAL | 0 refills | Status: DC

## 2023-04-29 NOTE — Telephone Encounter (Signed)
 I have sent a Medrol Dosepak for him.  He can also take Zyrtec over-the-counter to help with his nasal congestion.

## 2023-04-29 NOTE — Telephone Encounter (Signed)
 [place] to P Rx Rheum/Pulm (supporting Murrell Redden, RPH-CPP)     04/28/23  8:29 PM I AM TRYING TO GET OVER A COLD I HAD FOR ABOUT 2 WEEKS.  STILL HAVE CONGESTION IN MY THROAT.  BUT WE HAER whizzing when I breath, would you prescribe something for this and prednisone, my cough is not bad but just trying to cough the phlegm up. Thanks so much

## 2023-04-29 NOTE — Telephone Encounter (Signed)
 See Patient message from 04/29/2023. I have opened a separated encounter to address his sickness.  Closing this encounter

## 2023-05-04 ENCOUNTER — Other Ambulatory Visit: Payer: Self-pay

## 2023-05-04 ENCOUNTER — Other Ambulatory Visit: Payer: Self-pay | Admitting: Cardiology

## 2023-05-04 ENCOUNTER — Other Ambulatory Visit (HOSPITAL_COMMUNITY): Payer: Self-pay

## 2023-05-04 NOTE — Progress Notes (Signed)
 Specialty Pharmacy Initial Fill Coordination Note  Antonio [name] is a 78 y.o. male contacted today regarding initial fill of specialty medication(s) Dupilumab (Dupixent)   Patient requested Delivery   Delivery date: 05/06/23   Verified address: [address]   [place] [id number]   Medication will be filled on 3/25.   Patient is aware of $0 copayment.    **NOTE** Add message for courier to leave package at the back door.

## 2023-05-05 ENCOUNTER — Other Ambulatory Visit: Payer: Self-pay

## 2023-05-08 NOTE — Progress Notes (Signed)
 Patient no longer qualifies for Dupixent PAP. He will pay towards $2000 OOP max for rx benefits. He has been on Dupixent since 2020 and doing well. Continue Dupixent 300mg  PFS every 14 days with Sherald Barge, PharmD, MPH, BCPS, CPP Clinical Pharmacist (Rheumatology and Pulmonology)

## 2023-05-11 NOTE — Telephone Encounter (Signed)
 Stay well-hydrated.  We can send an order for an Acapella flutter valve to help him clear secretions.  He can also use his nebulizer when he is very congested this should help clear the phlegm.

## 2023-05-11 NOTE — Addendum Note (Signed)
 Addended by: Bonney Leitz on: 05/11/2023 09:22 AM   Modules accepted: Orders

## 2023-05-12 ENCOUNTER — Other Ambulatory Visit: Payer: Self-pay | Admitting: Cardiology

## 2023-05-14 DIAGNOSIS — J4550 Severe persistent asthma, uncomplicated: Secondary | ICD-10-CM | POA: Diagnosis not present

## 2023-05-15 ENCOUNTER — Telehealth: Payer: Self-pay | Admitting: Family Medicine

## 2023-05-15 NOTE — Telephone Encounter (Signed)
 Left message  Dr Clent Ridges is leaving the practice in June. If you wish to continue care at this office, please call and schedule a transfer of care to one of the following providers: Dr Charlann Lange, MD,  Darleen Crocker or Kara Dies, NP.  E2C2 please schedule

## 2023-05-16 ENCOUNTER — Other Ambulatory Visit: Payer: Self-pay | Admitting: Cardiology

## 2023-05-16 DIAGNOSIS — I480 Paroxysmal atrial fibrillation: Secondary | ICD-10-CM

## 2023-05-18 NOTE — Progress Notes (Unsigned)
 Electrophysiology Clinic Note    Date:  05/19/2023  Patient ID:  Antonio, [name] 1945-09-14, MRN [id number] PCP:  Skip Estimable, MD  Cardiologist:  Yvonne Kendall, MD Electrophysiologist: Lanier Prude, MD    Discussed the use of AI scribe software for clinical note transcription with the patient, who gave verbal consent to proceed.   Patient Profile    Chief Complaint: parox AFib follow-up  History of Present Illness: Antonio [name] is a 78 y.o. male with PMH notable for parox AFib, non-obs CAD, PAD, HTN, HLD, COPD, severe asthma; seen today for Lanier Prude, MD for routine electrophysiology followup.  He last saw Dr. Lalla Brothers 04/2022. He is not a candidate for invasive EP procedures d/t severe lung disease. On flecainide for rhythm mgmt.  I last saw him 02/2023 where he had run out of dilt and continued to take 50mg  flecainide BID. Dilt restarted. He saw NP Hammock 03/2023 where he was doing well without AF episodes.   On follow-up today, he has not had any episodes of Afib that he is aware of. He had a URI about a month ago and received steroids from Pulm (Dr. Jayme Cloud). He is still coughing, has sensation of phlegm in throat, and is concerned that he's wheezing.  He continues to take flecainide BID along with dilt 120mg .  He continues to take xarelto daily. No bleeding concerns from GI/GU , he does have bruising along forearms.    AAD History: Flecainide     ROS:  Please see the history of present illness. All other systems are reviewed and otherwise negative.    Physical Exam    VS:  BP (!) 140/68 (BP Location: Left Arm, Patient Position: Sitting, Cuff Size: Normal)   Pulse 60   Resp 16   Ht 5\' 10"  (1.778 m)   Wt 133 lb (60.3 kg)   SpO2 100%   BMI 19.08 kg/m  BMI: Body mass index is 19.08 kg/m.  Wt Readings from Last 3 Encounters:  05/19/23 133 lb (60.3 kg)  04/15/23 135 lb 6.4 oz (61.4 kg)  03/31/23 136 lb (61.7 kg)     GEN- The  patient is frail-appearing, thin. Alert and oriented x 3 today.   Lungs- Clear to ausculation bilaterally, normal work of breathing.  Heart- Regular rate and rhythm, no murmurs, rubs or gallops Extremities- No peripheral edema, warm, dry    Studies Reviewed   Previous EP, cardiology notes.    EKG is ordered. Personal review of EKG from today shows:    EKG Interpretation Date/Time:  Tuesday May 19 2023 10:46:17 EDT Ventricular Rate:  60 PR Interval:  196 QRS Duration:  82 QT Interval:  404 QTC Calculation: 404 R Axis:   83  Text Interpretation: Sinus rhythm with marked sinus arrhythmia When compared with ECG of 31-Mar-2023 11:18, No significant change was found Confirmed by Sherie Don [phone]) on 05/19/2023 10:48:51 AM     03/31/2023 EKG - SB with PACs, rate 57, RAD PR - 178, QRS 84, QT 404     Coronary CT, 03/06/2022 1. Coronary calcium score of 251. This was 42nd percentile for age and sex matched control.  2. Normal coronary origin with left dominance.  3. Mild distal LM and proximal LAD stenosis (25-49%)  4. CAD-RADS 2. Mild non-obstructive CAD (25-49%). Consider non-atherosclerotic causes of chest pain. Consider preventive therapy and risk factor modification.  TTE, 03/11/2021  1. Left ventricular ejection fraction, by estimation, is 55%. Left ventricular  ejection fraction by 2D MOD biplane is 54.2 %. The left ventricle has normal function. The left ventricle has no regional wall motion abnormalities. Left ventricular diastolic parameters were normal.   2. Right ventricular systolic function is normal. The right ventricular size is normal.   3. The mitral valve is normal in structure. No evidence of mitral valve regurgitation.   4. The aortic valve is tricuspid. Aortic valve regurgitation is not visualized.   5. The inferior vena cava is normal in size with greater than 50% respiratory variability, suggesting right atrial pressure of 3 mmHg.      Assessment and Plan      #) parox AFib #) flecainide monitoring No recent palpitation episodes EKG with stable intervals Continue flecainide BID + 120mg  cardizem nightly Recent labs stable  #) Hypercoag d/t parox afib CHA2DS2-VASc Score = at least 4 [CHF History: 0, HTN History: 1, Diabetes History: 0, Stroke History: 0, Vascular Disease History: 1, Age Score: 2, Gender Score: 0].  Therefore, the patient's annual risk of stroke is 4.8 %.    Stroke ppx - xarelto 15mg  daily. CrCl has increased to > 84ml/min.  Increase xarelto to 20mg  daily  #) COPD #) cough Severe lung disease prevents invasive EP procedures Recommended he follow-up with pulm for ongoing cough, phlegm. No wheezing appreciated on today's exam.       Current medicines are reviewed at length with the patient today.   The patient does not have concerns regarding his medicines.  The following changes were made today:   none  Labs/ tests ordered today include:  Orders Placed This Encounter  Procedures   EKG 12-Lead     Disposition: Follow up with Dr. Lalla Brothers or EP APP in 6 months   Signed, Sherie Don, NP  05/19/23  11:42 AM  Electrophysiology [facility]

## 2023-05-18 NOTE — Telephone Encounter (Signed)
 Prescription refill request for Xarelto received.  Indication:afib Last office visit:2/25 Weight:61.4  kg Age:78 Scr:0.88  1/25 CrCl:58.14  ml/min  Prescription refilled

## 2023-05-19 ENCOUNTER — Telehealth: Payer: Self-pay | Admitting: Internal Medicine

## 2023-05-19 ENCOUNTER — Encounter: Payer: Self-pay | Admitting: Cardiology

## 2023-05-19 ENCOUNTER — Ambulatory Visit: Payer: PPO | Attending: Cardiology | Admitting: Cardiology

## 2023-05-19 VITALS — BP 140/68 | HR 60 | Resp 16 | Ht 70.0 in | Wt 133.0 lb

## 2023-05-19 DIAGNOSIS — D6869 Other thrombophilia: Secondary | ICD-10-CM

## 2023-05-19 DIAGNOSIS — I480 Paroxysmal atrial fibrillation: Secondary | ICD-10-CM | POA: Diagnosis not present

## 2023-05-19 DIAGNOSIS — Z79899 Other long term (current) drug therapy: Secondary | ICD-10-CM

## 2023-05-19 DIAGNOSIS — Z5181 Encounter for therapeutic drug level monitoring: Secondary | ICD-10-CM | POA: Diagnosis not present

## 2023-05-19 MED ORDER — RIVAROXABAN 20 MG PO TABS: 20.0000 mg | ORAL_TABLET | Freq: Every day | ORAL | 3 refills | Status: DC

## 2023-05-19 NOTE — Patient Instructions (Signed)
 Medication Instructions:  Your physician recommends that you continue on your current medications as directed. Please refer to the Current Medication list given to you today.   *If you need a refill on your cardiac medications before your next appointment, please call your pharmacy*  Lab Work: No labs ordered today   Testing/Procedures: No test ordered today   Follow-Up: At [facility], you and your health needs are our priority.  As part of our continuing mission to provide you with exceptional heart care, our providers are all part of one team.  This team includes your primary Cardiologist (physician) and Advanced Practice Providers or APPs (Physician Assistants and Nurse Practitioners) who all work together to provide you with the care you need, when you need it.  Your next appointment:   4-6 month(s)  Provider:   Sherie Don, NP  We recommend signing up for the patient portal called "MyChart".  Sign up information is provided on this After Visit Summary.  MyChart is used to connect with patients for Virtual Visits (Telemedicine).  Patients are able to view lab/test results, encounter notes, upcoming appointments, etc.  Non-urgent messages can be sent to your provider as well.   To learn more about what you can do with MyChart, go to [email or web address].

## 2023-05-19 NOTE — Telephone Encounter (Signed)
 Patient stated he is returning NP S. Riddle's call.

## 2023-05-21 ENCOUNTER — Encounter: Payer: Self-pay | Admitting: Pulmonary Disease

## 2023-05-21 ENCOUNTER — Ambulatory Visit: Admitting: Pulmonary Disease

## 2023-05-21 VITALS — BP 110/66 | HR 54 | Temp 97.7°F | Ht 70.0 in | Wt 131.2 lb

## 2023-05-21 DIAGNOSIS — J4550 Severe persistent asthma, uncomplicated: Secondary | ICD-10-CM

## 2023-05-21 DIAGNOSIS — J449 Chronic obstructive pulmonary disease, unspecified: Secondary | ICD-10-CM

## 2023-05-21 DIAGNOSIS — G4736 Sleep related hypoventilation in conditions classified elsewhere: Secondary | ICD-10-CM | POA: Diagnosis not present

## 2023-05-21 MED ORDER — ALBUTEROL SULFATE (2.5 MG/3ML) 0.083% IN NEBU: 2.5000 mg | INHALATION_SOLUTION | Freq: Four times a day (QID) | RESPIRATORY_TRACT | 12 refills | Status: AC | PRN

## 2023-05-21 NOTE — Progress Notes (Signed)
 Subjective:    Patient ID: Antonio [name], male    DOB: 12-18-1945, 78 y.o.   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  He was last seen here 18 March 2024.  Continues taking Ohtuvayre , notes benefit of the medication.  He is also on Dulera  twice a day, and Dupixent  for his severe asthma.  HPI Discussed the use of AI scribe software for clinical note transcription with the patient, who gave verbal consent to proceed.  History of Present Illness   Antonio [name] is an 78 year old male with COPD and asthma overlap who presents with wheezing and respiratory symptoms.  He presents with his wife who always accompanies him to his visits.  He experiences wheezing when breathing and uses a flutter valve device to manage issues with tenacious secretions. The device has a dial to adjust intensity, and he uses it at the highest setting to feel the effects in his chest. He uses the flutter valve once or twice a day.  Approximately four to five weeks ago, he had a cold that settled in his chest. He took Mucinex  to help clear the congestion, and the mucus is now clear. He did not receive antibiotics after the cold.  He is sensitive to pollen and other allergens, which may exacerbate his respiratory symptoms.  His current medications include Ohtuvayre , Dupixent , Dulera , and flecainide  (Tambocor ) for atrial fibrillation. He was  previously on Xarelto  15 mg, which was increased to 20 mg. He believes he has albuterol  at home for use with a nebulizer.  He has not needed the nebulizer lately.  He does not endorse any fevers, chills or sweats.  He does not have any chest pain, cough has been well-controlled.  No purulent sputum production, no hemoptysis.  When he expectorates it is clear.  He is compliant with nocturnal oxygen  for nocturnal hypoxemia due to emphysema.  He notes benefit of the therapy.    DATA 06/09/2014 pulmonary function test> ratio 51%, FEV1 1.47 L (40% predicted), total lung capacity 5.43 L (71% predicted). DLCO 16.26 (44% predicted) December 2017 lung function testing showed ratio 50%, FEV1 1.47 L 43% predicted, FVC 2.96 L 67% predicted March 2020 pulmonary function testing ratio 38%, FEV1 0.97 L 31% predicted, total lung capacity 8.44 L 119% predicted, residual volume 198% predicted, DLCO 14.0 55% predicted March 2020 high-resolution CT scan of the chest images : Centrilobular emphysema, no interstitial lung disease, aortic atherosclerosis 04/20/2018-eosinophils relative 7.3, eosinophils absolute 0.6 04/20/2018-IgE-67 01/20/2019-SARS-CoV-2-detected 03/06/2021 alpha 1 antitrypsin: Phenotype MM, level 165 03/11/2021 echocardiogram: LVEF 55%, right ventricular function normal.  No wall motion abnormalities. 04/03/2021 PFT: FEV1 1.09 L or 36% predicted, FVC 2.60 L or 61% predicted, FEV1/FVC 42%, no bronchodilator response. Lung volumes showed moderate air trapping.  Diffusion capacity severely decreased at 49%. 05/22/2022 chest x-ray 1 view: Patchy airspace disease in the medial right base concerning for pneumonia. 11/12/2022 chest x-ray PA and lateral: No overt infiltrate noted, changes of severe COPD noted. 01/07/2023 chest x-ray  PA and lateral: Emphysema without evidence of acute cardiopulmonary disease.  Review of Systems A 10 point review of systems was performed and it is as noted above otherwise  negative.   Patient Active Problem List   Diagnosis Date Noted   Hyperlipidemia 03/02/2023   Vitamin D  deficiency 03/02/2023   Vitamin B 12 deficiency 03/02/2023   Hemorrhoids 03/02/2023   Sleep disturbances 03/02/2023   Weight loss 03/02/2023   Demand ischemia (HCC) 05/24/2022   Pure hypercholesterolemia 05/24/2022   Primary hypertension 05/24/2022   Atrial fibrillation with rapid ventricular response (HCC) 05/22/2022   Sepsis due to pneumonia (HCC) 05/22/2022   CAP (community acquired pneumonia) 05/22/2022   Nocturnal hypoxemia 05/22/2022   BPH (benign prostatic hyperplasia) 05/22/2022   History of urinary retention 05/22/2022   Coronary artery disease involving native coronary artery of native heart without angina pectoris 03/21/2022   Precordial pain 02/19/2022   Atypical chest pain 12/21/2021   DOE (dyspnea on exertion) 01/24/2021   Secondary hypercoagulable state (HCC) 12/19/2020   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   PAD ss/p SFA stent(peripheral artery disease) 11/08/2019   Paroxysmal atrial fibrillation (HCC) 09/02/2019   Stiffness of finger joint of left hand 04/06/2019   History of COVID-19 01/20/2019   Severe persistent asthma without complication 01/20/2019   Medication management 09/20/2018   Stage 3 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 (HCC) 09/20/2018   Increased eosinophils in the blood 06/21/2018   Diffusion capacity of lung (dl), decreased 16/11/9602   COPD with acute exacerbation (HCC) 06/22/2017   Atrial flutter, paroxysmal (HCC) 10/17/2016   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/2016   SVT (supraventricular tachycardia) (HCC) 10/17/2016   Ulcerative colitis (HCC) 05/28/2016   Cervical stenosis of spinal canal 05/05/2016   Carotid stenosis 05/02/2016   Atherosclerosis of native arteries of extremity with intermittent claudication (HCC) 05/02/2016   Cramping of hands 01/10/2016   Bilateral carpal tunnel syndrome 12/06/2015   Centrilobular Emphysema 04/25/2014   Former smoker  04/25/2014   Tobacco abuse 04/25/2014    Social History   Tobacco Use   Smoking status: Former    Current packs/day: 0.00    Average packs/day: 0.5 packs/day for 35.0 years (17.5 ttl pk-yrs)    Types: Cigarettes, Cigars    Start date: 06/10/1976    Quit date: 06/11/2011    Years since quitting: 11.9   Smokeless tobacco: Never   Tobacco comments:    Former smoker 12/19/2020  Substance Use Topics   Alcohol  use: Yes    Alcohol /week: 6.0 standard drinks of alcohol     Types: 6 Shots of liquor per week    Comment: wine, liquor, or beer    Allergies  Allergen Reactions   Penicillins Rash    Has patient had a PCN reaction causing immediate rash, facial/tongue/throat swelling, SOB or lightheadedness with hypotension:Yes Has patient had a PCN reaction causing severe rash involving mucus membranes or skin necrosis:Yes Has patient had a PCN reaction that required hospitalization:No Has patient had a PCN reaction occurring within the last 10 years:No If all of the above answers are "NO", then may proceed with Cephalosporin use.     Current Meds  Medication Sig   albuterol  (PROVENTIL ) (2.5 MG/3ML) 0.083% nebulizer solution Take 3 mLs (2.5 mg total) by nebulization every 6 (six) hours as needed for wheezing or shortness of breath.   Azelastine  HCl 137 MCG/SPRAY SOLN Place 1 spray into both nostrils 2 (two) times daily.   diltiazem  (CARDIZEM  CD) 120 MG 24 hr capsule TAKE 1 CAPSULE BY MOUTH EVERY  DAY   dupilumab  (DUPIXENT ) 300 MG/2ML prefilled syringe Inject 300 mg into the skin every 14 (fourteen) days.   flecainide  (TAMBOCOR ) 50 MG tablet TAKE 1 TABLET BY MOUTH TWICE A DAY   furosemide  (LASIX ) 20 MG tablet TAKE 1 TABLET BY MOUTH DAILY AS NEEDED (FOR SWELLING OR WEIGHT GAIN OF 3 LBS OVERNIGHT OR 5 LBS IN A WEEK). (Patient taking differently: Take 20 mg by mouth daily as needed for fluid or edema.)   hydrocortisone  (ANUSOL -HC) 25 MG suppository Place 1 suppository (25 mg total) rectally 2 (two)  times daily.   mesalamine  (LIALDA ) 1.2 g EC tablet TAKE 4 TABLETS BY MOUTH DAILY WITH BREAKFAST (Patient taking differently: Take 2.4 g by mouth daily with breakfast.)   mometasone -formoterol  (DULERA ) 200-5 MCG/ACT AERO Inhale 2 puffs into the lungs 2 (two) times daily. Rinse mouth well after use.   nitroGLYCERIN  (NITROSTAT ) 0.4 MG SL tablet Place 1 tablet (0.4 mg total) under the tongue every 5 (five) minutes as needed for chest pain.   OHTUVAYRE  3 MG/2.5ML SUSP Inhale 3 mg into the lungs in the morning and at bedtime.   ondansetron  (ZOFRAN -ODT) 4 MG disintegrating tablet Take 1 tablet (4 mg total) by mouth every 8 (eight) hours as needed for nausea or vomiting.   rivaroxaban  (XARELTO ) 20 MG TABS tablet Take 1 tablet (20 mg total) by mouth daily with supper.   rosuvastatin  (CRESTOR ) 10 MG tablet TAKE 1 TABLET BY MOUTH DAILY   Vitamin D , Ergocalciferol , (DRISDOL ) 1.25 MG (50000 UNIT) CAPS capsule Take 1 capsule (50,000 Units total) by mouth every 7 (seven) days.   XDEMVY 0.25 % SOLN   Pt. Also on Ohtuvayre   Immunization History  Administered Date(s) Administered   Fluad Quad(high Dose 65+) 11/01/2020, 10/29/2021   Fluad Trivalent(High Dose 65+) 10/16/2022   Influenza Inj Mdck Quad Pf 10/28/2017   Influenza Split 11/24/2013   Influenza, High Dose Seasonal PF 11/05/2016, 10/29/2017, 10/06/2018, 11/03/2019   Influenza,inj,Quad PF,6+ Mos 11/08/2014, 11/26/2015   Influenza-Unspecified 10/11/2013, 10/11/2013   PFIZER(Purple Top)SARS-COV-2 Vaccination 05/06/2019, 05/31/2019, 01/27/2020   PNEUMOCOCCAL CONJUGATE-20 06/03/2022   Pneumococcal Conjugate-13 04/18/2014   Pneumococcal Polysaccharide-23 11/26/2015   Respiratory Syncytial Virus Vaccine,Recomb Aduvanted(Arexvy) 10/14/2022   Tdap 02/13/2011   Zoster Recombinant(Shingrix) 12/01/2016, 03/02/2017   Zoster, Live 11/07/2010        Objective:     BP 110/66 (BP Location: Left Arm, Patient Position: Sitting, Cuff Size: Normal)   Pulse (!)  54   Temp 97.7 F (36.5 C) (Temporal)   Ht 5\' 10"  (1.778 m)   Wt 131 lb 3.2 oz (59.5 kg)   SpO2 95%   BMI 18.83 kg/m   SpO2: 95 %  GENERAL: Thin well-developed gentleman, no acute distress.  Well-groomed, fully ambulatory without assistance.  No conversational dyspnea. HEAD: Normocephalic, atraumatic.  EYES: Pupils equal, round, reactive to light.  No scleral icterus.  MOUTH: Dentition intact.  Oral mucosa moist.  No thrush. NECK: Supple. No thyromegaly. Trachea midline. No JVD.  No adenopathy. PULMONARY: Distant breath sounds bilaterally.  Coarse breath sounds, no wheezes or rhonchi noted. CARDIOVASCULAR: S1 and S2.  Regular rate and rhythm.  No rubs, murmurs or gallops heard. ABDOMEN: Benign. MUSCULOSKELETAL: No joint deformity, no clubbing, no edema.  Muscle tone improved.   NEUROLOGIC: No overt focal deficit, no gait disturbance, speech is fluent. SKIN: Intact,warm,dry. PSYCH: Mood and behavior normal.   Assessment & Plan:     ICD-10-CM   1. Stage 3 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 (HCC)  J44.9  2. Severe persistent asthma without complication  J45.50     3. Nocturnal hypoxemia due to emphysema (HCC)  J43.9    G47.36      Meds ordered this encounter  Medications   albuterol  (PROVENTIL ) (2.5 MG/3ML) 0.083% nebulizer solution    Sig: Take 3 mLs (2.5 mg total) by nebulization every 6 (six) hours as needed for wheezing or shortness of breath.    Dispense:  75 mL    Refill:  12    Discussion:    COPD with asthma overlap Francisco has COPD with asthma overlap, experiencing wheezing and mucus production. Recent cold exacerbated symptoms, but chest x-ray was normal. Current medications include Ohtuvayre , Dupixent , and Dulera . He uses a flutter valve and nebulizer for symptom management. No antibiotics were prescribed post-cold due to potential interactions with flecainide . Albuterol  nebulizer is recommended for acute wheezing, followed by flutter valve exercises to aid  mucus clearance. - Continue Ohtuvayre , Dupixent , and Dulera  as prescribed - Use albuterol  nebulizer for wheezing, followed by flutter valve exercises - Perform flutter valve exercises with a set of twelve repetitions - Stay well hydrated - Prescribe additional albuterol  for nebulizer use - Consider azithromycin  if symptoms worsen and drug interactions allow - Schedule follow-up in three months  Recent upper respiratory infection Francisco had a cold 4-5 weeks ago, which settled in the chest. Symptoms have mostly resolved with Mucinex , but occasional clear sputum persists. No antibiotics were given due to potential interactions with current medications. - Use albuterol  nebulizer as needed for symptom management  Atrial fibrillation Francisco is on Xarelto  and flecainide  (Tambocor ) for atrial fibrillation. Recent adjustment in Xarelto  dosage from 15 mg to 20 mg as per cardiologist's recommendation. Potential drug interactions with flecainide  limit antibiotic options for respiratory issues. - Continue Xarelto  and flecainide  as prescribed - Monitor for potential drug interactions  Medication management Francisco is on multiple medications including Ohtuvayre , Dupixent , Dulera , Xarelto , and flecainide . There is a need to manage potential drug interactions, especially with flecainide . - Review medication interactions regularly - Ensure adequate supply of all prescribed medications      Advised if symptoms do not improve or worsen, to please contact office for sooner follow up or seek emergency care.    I spent 33 minutes of dedicated to the care of this patient on the date of this encounter to include pre-visit review of records, face-to-face time with the patient discussing conditions above, post visit ordering of testing, clinical documentation with the electronic health record, making appropriate referrals as documented, and communicating necessary findings to members of the patients care  team.     C. Chloe Counter, MD Advanced Bronchoscopy PCCM [facility]    *This note was generated using voice recognition software/Dragon and/or AI transcription program.  Despite best efforts to proofread, errors can occur which can change the meaning. Any transcriptional errors that result from this process are unintentional and may not be fully corrected at the time of dictation.

## 2023-05-21 NOTE — Patient Instructions (Signed)
 VISIT SUMMARY:  Today, we discussed your ongoing respiratory symptoms, including wheezing and mucus production, which have been exacerbated by a recent cold. We also reviewed your current medications and their management, particularly in relation to your COPD with asthma overlap and atrial fibrillation.  YOUR PLAN:  -COPD WITH ASTHMA OVERLAP: COPD with asthma overlap means you have chronic obstructive pulmonary disease along with asthma, causing wheezing and mucus production. Continue using Ohtuvayre, Dupixent, and Dulera as prescribed. Use the albuterol nebulizer for wheezing, followed by flutter valve exercises with twelve repetitions to help clear mucus. Stay well hydrated. We will prescribe additional albuterol for your nebulizer. Follow up in three months.  -RECENT UPPER RESPIRATORY INFECTION: You had a cold 4-5 weeks ago that affected your chest, but symptoms have mostly resolved with Mucinex. Continue using the albuterol nebulizer as needed for any remaining symptoms.  -ATRIAL FIBRILLATION: Atrial fibrillation is an irregular and often rapid heart rate. You are on Xarelto and flecainide (Tambocor) for this condition. Continue taking these medications as prescribed and monitor for any potential drug interactions.  -MEDICATION MANAGEMENT: You are on multiple medications, including Ohtuvayre, Dupixent, Dulera, Xarelto, and flecainide. It is important to regularly review these medications for potential interactions and ensure you have an adequate supply of all prescribed medications.  INSTRUCTIONS:  Please schedule a follow-up appointment in three months. Continue using your medications as prescribed and monitor for any changes in your symptoms. If your respiratory symptoms worsen, contact us immediately.

## 2023-05-30 ENCOUNTER — Encounter: Payer: Self-pay | Admitting: Pulmonary Disease

## 2023-06-02 DIAGNOSIS — H0100B Unspecified blepharitis left eye, upper and lower eyelids: Secondary | ICD-10-CM | POA: Diagnosis not present

## 2023-06-02 DIAGNOSIS — H0100A Unspecified blepharitis right eye, upper and lower eyelids: Secondary | ICD-10-CM | POA: Diagnosis not present

## 2023-06-02 DIAGNOSIS — B880 Other acariasis: Secondary | ICD-10-CM | POA: Diagnosis not present

## 2023-06-30 ENCOUNTER — Encounter (INDEPENDENT_AMBULATORY_CARE_PROVIDER_SITE_OTHER): Payer: Self-pay

## 2023-07-21 ENCOUNTER — Other Ambulatory Visit: Payer: Self-pay

## 2023-07-22 ENCOUNTER — Other Ambulatory Visit: Payer: Self-pay

## 2023-07-22 NOTE — Progress Notes (Signed)
 Specialty Pharmacy Refill Coordination Note  Antonio [name] is a 78 y.o. male contacted today regarding refills of specialty medication(s) Dupilumab  (Dupixent )   Patient requested Delivery   Delivery date: 07/29/23   Verified address: [address]   [place] [address]   Medication will be filled on 06.17.25.

## 2023-07-23 ENCOUNTER — Other Ambulatory Visit: Payer: Self-pay

## 2023-07-28 ENCOUNTER — Other Ambulatory Visit: Payer: Self-pay

## 2023-07-28 ENCOUNTER — Other Ambulatory Visit (HOSPITAL_COMMUNITY): Payer: Self-pay

## 2023-07-28 NOTE — Progress Notes (Signed)
 Cardiology Office Note   Date:  07/29/2023  ID:  Lander, Eslick 09-01-1945, MRN [id number] PCP: Jacklin Mascot, MD  [facility] Providers Cardiologist:  Sammy Crisp, MD Electrophysiologist:  Boyce Byes, MD     History of Present Illness Antonio [name] is a 78 y.o. 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   Echocardiogram completed January 2020 showed normal LV function.  Stress test in May 2023 in the setting of chest tightness revealed normal LV function without ischemia or infarct.  Coronary calcium  was noted on scans.  In January 2021 for this any recurrent chest discomfort decision was made to pursue an coronary CTA which showed mild nonobstructive CAD.  Paroxysmal atrial fibrillation/atrial flutter was continued to be followed by EP.  It was felt he was not a candidate for advanced procedures secondary to severe lung disease.  He was continued to be managed on flecainide  and rivaroxaban .  Earlier in the year his metoprolol  dosing was reduced secondary to baseline bradycardia with limited dosing adjustments.  On 05/19/22 with symptoms of mild dyspnea, malaise, pleuritic back pain which progressed throughout the week became associated with chest tightness and worsening dyspnea.  On 05/22/2022 presenting [facility] emergency department send to be in atrial fibrillation with RVR with rates of 170s to 180s.  He had a fever 102.5, white count of 13,000, lactic acid elevated 2.7.  High-sensitivity troponin was mildly elevated with a peak of 34.  He was placed on IV diltiazem  as well as antibiotics for management of pneumonia and sepsis and was admitted for continued evaluation.  He converted to sinus rhythm  and was maintained on IV diltiazem .  Home doses of flecainide  and metoprolol  were continued.  He was continued on rate control with diltiazem  and flecainide  and metoprolol  was discontinued.  He was considered stable and subsequently discharged on 05/28/2022.  He was evaluated in clinic on 02/18/2023 by EP.  At that time he was doing well from the A-fib standpoint.  He had not needed any as needed metoprolol  and recently ran out of his diltiazem  but continued to take 50 mg of flecainide  twice daily.  He was previously prescribed 75 mg twice daily but was unable to cut his pills in half due to the small size of the pills.  He was continued on rivaroxaban .  Flecainide  was decreased to 50 mg twice daily and he was restarted on diltiazem  120 mg daily.  He was seen in clinic on 03/31/2023 accompanied by his wife with Seroquel for the cardiac perspective.  He continues to suffer from chronic dyspnea recently been followed by EP and pulmonary.  He was continued on his current medication regimen without any changes needed at that time.   He was last seen in clinic on 05/19/23 by EP.  He had not had any repeat episodes of atrial fibrillation that he was aware of.  He had suffered from a respiratory infection about a month prior and received shots from pulmonary.  Continue to take flecainide  twice daily along with diltiazem  120 mg daily as well as his rivaroxaban .  No issues with bleeding with blood noted in his urine or stool.  He was continued on his current medications with no changes made.  He was recommended to continue follow-up with pulmonary for ongoing upper  respiratory issues.  He returns to clinic today accompanied by his wife.  He denies any chest pain or worsening shortness of breath, peripheral edema, lightheadedness or dizziness.  Denies any reoccurrence of atrial fibrillation. He does have concerns today of weight loss and reduced appetite.  He is requesting something to stimulate his appetite today.  He has  recently had to change PCPs and will not be following up with his new primary care provider until October.  He continues to follow with pulmonary and vascular.  He states that he has been compliant with his current medication regimen.  Has not missed any doses of his rivaroxaban  and denies any bleeding with no blood noted in his urine or stool.  Denies any hospitalizations or visits to the emergency department.  ROS: 10 point review of system has been reviewed and considered negative except what is listed in the HPI  Studies Reviewed EKG Interpretation Date/Time:  Wednesday July 29 2023 10:34:23 EDT Ventricular Rate:  65 PR Interval:  194 QRS Duration:  86 QT Interval:  384 QTC Calculation: 399 R Axis:   97  Text Interpretation: Sinus rhythm with marked sinus arrhythmia Indeterminate axis When compared with ECG of 19-May-2023 10:46, No significant change was found Confirmed by Ronald Cockayne ([id number]) on 07/29/2023 10:39:11 AM    Coronary CTA 03/06/22 IMPRESSION: 1. Coronary calcium  score of 251. This was 42nd percentile for age and sex matched control.   2. Normal coronary origin with left dominance.   3. Mild distal LM and proximal LAD stenosis (25-49%)   4. CAD-RADS 2. Mild non-obstructive CAD (25-49%). Consider non-atherosclerotic causes of chest pain. Consider preventive therapy and risk factor modification.   TTE 03/11/21 1. Left ventricular ejection fraction, by estimation, is 55%. Left  ventricular ejection fraction by 2D MOD biplane is 54.2 %. The left  ventricle has normal function. The left ventricle has no regional wall  motion abnormalities. Left ventricular  diastolic parameters were normal.   2. Right ventricular systolic function is normal. The right ventricular  size is normal.   3. The mitral valve is normal in structure. No evidence of mitral valve  regurgitation.   4. The aortic valve is tricuspid. Aortic valve regurgitation is not  visualized.   5. The  inferior vena cava is normal in size with greater than 50%  respiratory variability, suggesting right atrial pressure of 3 mmHg.  Risk Assessment/Calculations  CHA2DS2-VASc Score = 4   This indicates a 4.8% annual risk of stroke. The patient's score is based upon: CHF History: 0 HTN History: 1 Diabetes History: 0 Stroke History: 0 Vascular Disease History: 1 Age Score: 2 Gender Score: 0            Physical Exam VS:  BP 126/60   Pulse 65   Ht 5' 10 (1.778 m)   Wt 128 lb 6.4 oz (58.2 kg)   BMI 18.42 kg/m    Wt Readings from Last 3 Encounters:  07/29/23 128 lb 6.4 oz (58.2 kg)  05/21/23 131 lb 3.2 oz (59.5 kg)  05/19/23 133 lb (60.3 kg)    GEN: Well nourished, well developed in no acute distress NECK: No JVD; No carotid bruits CARDIAC: RRR, no murmurs, rubs, gallops RESPIRATORY:  Clear with diminished bases to auscultation without rales, wheezing or rhonchi  ABDOMEN: Soft, non-tender, non-distended EXTREMITIES:  No edema; No deformity   ASSESSMENT AND PLAN Paroxysmal atrial fibrillation/atrial flutter that is sinus sinus arrhythmia with a rate of 65 with no acute changes  noted on EKG today.  He is continued on rivaroxaban  20 mg daily for CHA2DS2-VASc score of at least 4 for stroke prophylaxis.  He has also continued on diltiazem  120 mg daily and flecainide  50 mg twice daily.  He has been encouraged to continue to follow-up with EP as previously scheduled.  Nonobstructive coronary artery disease native coronary arteries without angina.  EKG today reveals sinus/sinus arrhythmia with no acute ischemic changes.  Coronary CTA January 2021 showed mild nonobstructive coronary artery disease.  He is continued on rivaroxaban  and lieu of aspirin and rosuvastatin  10 mg daily.  Primary hypertension with a blood pressure today 126/60.  Blood pressures remain stable.  He is continued on diltiazem .  He has been encouraged to continue to monitor his pressure 1 to 2 hours postmedication  administration as well.  Mixed hyperlipidemia with last LDL of 41.  Remains at goal.  He is continued on rosuvastatin  10 mg daily.  Peripheral arterial disease with occasional claudication.  He is continue on statin therapy and rivaroxaban  and lieu of aspirin.  He has ongoing management per vascular.  Severe COPD where he continues to follow with pulmonary Dr. Viva Grise.  Inhalers and nebulizers have been continued and he remains on 2 L of O2 via nasal cannula for nocturnal hypoxia and emphysema.  Weight loss with decreased appetite.  The patient is requesting something to stimulate his appetite today.  We have discussed various changes to his diet with increasing protein. His wife would like to try dietary changes prior to medication changes. He has been advised to avoid edibles infused with cannabis at this time due to the limited accuracy in information related to drug interactions with his multiple prescription medications.        Dispo: Patient to return to clinic to see MD/APP in 3 months or sooner if needed   Signed, Jeanene Mena, NP

## 2023-07-29 ENCOUNTER — Ambulatory Visit: Payer: PPO | Attending: Cardiology | Admitting: Cardiology

## 2023-07-29 ENCOUNTER — Encounter: Payer: Self-pay | Admitting: Cardiology

## 2023-07-29 VITALS — BP 126/60 | HR 65 | Ht 70.0 in | Wt 128.4 lb

## 2023-07-29 DIAGNOSIS — I739 Peripheral vascular disease, unspecified: Secondary | ICD-10-CM

## 2023-07-29 DIAGNOSIS — E782 Mixed hyperlipidemia: Secondary | ICD-10-CM

## 2023-07-29 DIAGNOSIS — I2510 Atherosclerotic heart disease of native coronary artery without angina pectoris: Secondary | ICD-10-CM

## 2023-07-29 DIAGNOSIS — I480 Paroxysmal atrial fibrillation: Secondary | ICD-10-CM

## 2023-07-29 DIAGNOSIS — J449 Chronic obstructive pulmonary disease, unspecified: Secondary | ICD-10-CM | POA: Diagnosis not present

## 2023-07-29 DIAGNOSIS — R634 Abnormal weight loss: Secondary | ICD-10-CM | POA: Diagnosis not present

## 2023-07-29 DIAGNOSIS — I10 Essential (primary) hypertension: Secondary | ICD-10-CM | POA: Diagnosis not present

## 2023-07-29 DIAGNOSIS — R630 Anorexia: Secondary | ICD-10-CM | POA: Diagnosis not present

## 2023-07-29 NOTE — Patient Instructions (Signed)
 Medication Instructions:  Your physician recommends that you continue on your current medications as directed. Please refer to the Current Medication list given to you today.   *If you need a refill on your cardiac medications before your next appointment, please call your pharmacy*  Lab Work: No labs ordered today  If you have labs (blood work) drawn today and your tests are completely normal, you will receive your results only by: MyChart Message (if you have MyChart) OR A paper copy in the mail If you have any lab test that is abnormal or we need to change your treatment, we will call you to review the results.  Testing/Procedures: No test ordered today   Follow-Up: At [facility], you and your health needs are our priority.  As part of our continuing mission to provide you with exceptional heart care, our providers are all part of one team.  This team includes your primary Cardiologist (physician) and Advanced Practice Providers or APPs (Physician Assistants and Nurse Practitioners) who all work together to provide you with the care you need, when you need it.  Your next appointment:   3 month(s)  Provider:   Sammy Crisp, MD or Ronald Cockayne, NP

## 2023-08-10 ENCOUNTER — Telehealth: Payer: Self-pay

## 2023-08-10 DIAGNOSIS — G4736 Sleep related hypoventilation in conditions classified elsewhere: Secondary | ICD-10-CM

## 2023-08-10 NOTE — Telephone Encounter (Signed)
 Copied from CRM [id number]. Topic: Clinical - Order For Equipment >> Aug 10, 2023 10:49 AM Celestine FALCON wrote: Reason for CRM: Pt stated Dr. Tamea had the pt on oxygen  at night previously, and a family member passed away a couple years back that had an oxygen  concentrator that was given to him. Pt stated now it is needing replaced. Pt's phone number is [phone] and phone number cell [phone] ok to leave a vm.

## 2023-08-10 NOTE — Telephone Encounter (Signed)
 Last ONO was in 2023.  Per Antonio [name]- he will need an updated ONO.  I notified the patient. He wants to know how sensitive the ONO test is because he has Raynaud disease and most times the oximeters will not measure his oxygen  correctly.

## 2023-08-11 NOTE — Telephone Encounter (Signed)
 Anderson order placed.

## 2023-08-11 NOTE — Telephone Encounter (Signed)
 Will need to go ahead and order a new ONO.

## 2023-08-13 NOTE — Telephone Encounter (Signed)
 Called pt's cell phone and left a detailed message on private voicemail box notifying him of his new ONO order has been placed and to redirect his question about the sensitivity of the test to the ONO supplier since they should know better than we would if his Raynard's would impact the quality of his test results.

## 2023-08-19 ENCOUNTER — Ambulatory Visit: Admitting: *Deleted

## 2023-08-19 VITALS — Ht 70.0 in | Wt 135.0 lb

## 2023-08-19 DIAGNOSIS — Z0000 Encounter for general adult medical examination without abnormal findings: Secondary | ICD-10-CM

## 2023-08-19 NOTE — Progress Notes (Signed)
 Subjective:   Antonio [name] is a 78 y.o. who presents for a Medicare Wellness preventive visit.  As a reminder, Annual Wellness Visits don't include a physical exam, and some assessments may be limited, especially if this visit is performed virtually. We may recommend an in-person follow-up visit with your provider if needed.  Visit Complete: Virtual I connected with  Antonio [name] on 08/19/23 by a audio enabled telemedicine application and verified that I am speaking with the correct person using two identifiers.  Patient Location: Home  Provider Location: Home Office  I discussed the limitations of evaluation and management by telemedicine. The patient expressed understanding and agreed to proceed.  Vital Signs: Because this visit was a virtual/telehealth visit, some criteria may be missing or patient reported. Any vitals not documented were not able to be obtained and vitals that have been documented are patient reported.  VideoDeclined- This patient declined Librarian, academic. Therefore the visit was completed with audio only.  Persons Participating in Visit: Patient.  AWV Questionnaire: Yes: Patient Medicare AWV questionnaire was completed by the patient on 08/15/23; I have confirmed that all information answered by patient is correct and no changes since this date.  Cardiac Risk Factors include: advanced age (>44men, >67 women);male gender;dyslipidemia;hypertension     Objective:    Today's Vitals   08/19/23 1424  Weight: 135 lb (61.2 kg)  Height: 5' 10 (1.778 m)   Body mass index is 19.37 kg/m.     08/19/2023    2:39 PM 05/22/2022   11:10 PM 05/22/2022   11:00 PM 12/20/2021    5:56 PM 12/13/2021    4:29 AM 12/06/2021    8:34 AM 11/23/2020   11:15 AM  Advanced Directives  Does Patient Have a Medical Advance Directive? Yes  Yes Yes Yes Yes No  Type of Estate agent of [place];Living will Living will   Healthcare Power of [place];Living will Living will;Healthcare Power of [employer] Power of [place];Living will   Does patient want to make changes to medical advance directive? No - Patient declined  No - Patient declined      Copy of Healthcare Power of Attorney in Chart? Yes - validated most recent copy scanned in chart (See row information)        Would patient like information on creating a medical advance directive?    No - Patient declined       Current Medications (verified) Outpatient Encounter Medications as of 08/19/2023  Medication Sig   albuterol  (PROVENTIL ) (2.5 MG/3ML) 0.083% nebulizer solution Take 3 mLs (2.5 mg total) by nebulization every 6 (six) hours as needed for wheezing or shortness of breath.   Azelastine  HCl 137 MCG/SPRAY SOLN Place 1 spray into both nostrils 2 (two) times daily.   diltiazem  (CARDIZEM  CD) 120 MG 24 hr capsule TAKE 1 CAPSULE BY MOUTH EVERY DAY   dupilumab  (DUPIXENT ) 300 MG/2ML prefilled syringe Inject 300 mg into the skin every 14 (fourteen) days.   flecainide  (TAMBOCOR ) 50 MG tablet TAKE 1 TABLET BY MOUTH TWICE A DAY   furosemide  (LASIX ) 20 MG tablet TAKE 1 TABLET BY MOUTH DAILY AS NEEDED (FOR SWELLING OR WEIGHT GAIN OF 3 LBS OVERNIGHT OR 5 LBS IN A WEEK). (Patient taking differently: daily as needed.)   hydrocortisone  (ANUSOL -HC) 25 MG suppository Place 1 suppository (25 mg total) rectally 2 (two) times daily.   mesalamine  (LIALDA ) 1.2 g EC tablet TAKE 4 TABLETS BY MOUTH DAILY WITH BREAKFAST  mometasone -formoterol  (DULERA ) 200-5 MCG/ACT AERO Inhale 2 puffs into the lungs 2 (two) times daily. Rinse mouth well after use.   nitroGLYCERIN  (NITROSTAT ) 0.4 MG SL tablet Place 1 tablet (0.4 mg total) under the tongue every 5 (five) minutes as needed for chest pain.   OHTUVAYRE  3 MG/2.5ML SUSP Inhale 3 mg into the lungs in the morning and at bedtime.   rivaroxaban  (XARELTO ) 20 MG TABS tablet Take 1 tablet (20 mg total) by mouth daily with supper.    rosuvastatin  (CRESTOR ) 10 MG tablet TAKE 1 TABLET BY MOUTH DAILY   Vitamin D , Ergocalciferol , (DRISDOL ) 1.25 MG (50000 UNIT) CAPS capsule Take 1 capsule (50,000 Units total) by mouth every 7 (seven) days.   XDEMVY 0.25 % SOLN    Facility-Administered Encounter Medications as of 08/19/2023  Medication   albuterol  (PROVENTIL ) (2.5 MG/3ML) 0.083% nebulizer solution 2.5 mg    Allergies (verified) Penicillins   History: Past Medical History:  Diagnosis Date   Allergy    Calf pain    COPD (chronic obstructive pulmonary disease) (HCC)    no Oxygen  per pt   COVID-19    Dyspnea    Essential hypertension    History of echocardiogram    a. 02/2021 Echo: EF 55%, no rwma, nl RV fxn.   Hypokalemia    Internal hemorrhoids    Mixed hyperlipidemia    Muscle cramp    Muscle pain    Nonobstructive CAD (coronary artery disease)    a. 06/2021 MV: EF 65%, no isch/infarct,Cor Ca2+; b. 02/2022 Cor CTA: LM 25, LAD 25-49, LCX nl, OM1/2 nl, RCA nondom, nl. Ca2+ = 252 (42nd%'ile).   PAD (peripheral artery disease) (HCC)    a. s/p L SFA stenting.   Paroxysmal atrial fibrillation (HCC) 08/2019   a. CHA2DS2VASc = 4-->xarelto /flecainide .   Pneumonia    PONV (postoperative nausea and vomiting)    Tubular adenoma of colon    Ulcerative colitis [facility])    Past Surgical History:  Procedure Laterality Date   ANTERIOR CERVICAL DECOMP/DISCECTOMY FUSION N/A 05/05/2016   Procedure: ANTERIOR CERVICAL DECOMPRESSION FUSION CERVICAL THREE-FOUR.;  Surgeon: Victory Gunnels, MD;  Location: [facility] OR;  Service: Neurosurgery;  Laterality: N/A;  right side approach   CATARACT EXTRACTION Bilateral    CERVICAL DISCECTOMY  04/2016   C3-C4   COLONOSCOPY     COLONOSCOPY  08/09/2015   HEMORRHOID SURGERY  1988   leg stent Left approx 5-6 yrs ago   2 to left leg   MENISCUS REPAIR Left 12/2013   MOHS SURGERY     POLYPECTOMY     ROTATOR CUFF REPAIR Left 2007   TONSILLECTOMY AND ADENOIDECTOMY  1957   Family History  Problem Relation  Age of Onset   Cancer Mother        leukemia   Colon polyps Mother    Heart disease Father    Heart attack Father 30   Healthy Daughter    Healthy Son    Colon cancer Neg Hx    Esophageal cancer Neg Hx    Rectal cancer Neg Hx    Stomach cancer Neg Hx    Social History   Socioeconomic History   Marital status: Married    Spouse name: Not on file   Number of children: 2   Years of education: Not on file   Highest education level: Bachelor's degree (e.g., BA, AB, BS)  Occupational History   Occupation: retired  Tobacco Use   Smoking status: Former    Current packs/day:  0.00    Average packs/day: 0.5 packs/day for 35.0 years (17.5 ttl pk-yrs)    Types: Cigarettes, Cigars    Start date: 06/10/1976    Quit date: 06/11/2011    Years since quitting: 12.1   Smokeless tobacco: Never   Tobacco comments:    Former smoker 12/19/2020  Vaping Use   Vaping status: Never Used  Substance and Sexual Activity   Alcohol  use: Yes    Alcohol /week: 6.0 standard drinks of alcohol     Types: 6 Shots of liquor per week    Comment: wine, liquor, or beer   Drug use: No   Sexual activity: Yes  Other Topics Concern   Not on file  Social History Narrative   married   Social Drivers of Corporate investment banker Strain: Low Risk  (08/15/2023)   Overall Financial Resource Strain (CARDIA)    Difficulty of Paying Living Expenses: Not hard at all  Food Insecurity: No Food Insecurity (08/19/2023)   Hunger Vital Sign    Worried About Running Out of Food in the Last Year: Never true    Ran Out of Food in the Last Year: Never true  Transportation Needs: No Transportation Needs (08/15/2023)   PRAPARE - Administrator, Civil Service (Medical): No    Lack of Transportation (Non-Medical): No  Physical Activity: Insufficiently Active (08/15/2023)   Exercise Vital Sign    Days of Exercise per Week: 3 days    Minutes of Exercise per Session: 20 min  Stress: Stress Concern Present (08/15/2023)    [employer] of Occupational Health - Occupational Stress Questionnaire    Feeling of Stress: To some extent  Social Connections: Socially Integrated (08/15/2023)   Social Connection and Isolation Panel    Frequency of Communication with Friends and Family: More than three times a week    Frequency of Social Gatherings with Friends and Family: Three times a week    Attends Religious Services: More than 4 times per year    Active Member of Clubs or Organizations: Yes    Attends Banker Meetings: 1 to 4 times per year    Marital Status: Married    Tobacco Counseling Counseling given: Not Answered Tobacco comments: Former smoker 12/19/2020    Clinical Intake:  Pre-visit preparation completed: Yes  Pain : No/denies pain     BMI - recorded: 19.37 Nutritional Status: BMI of 19-24  Normal Nutritional Risks: None Diabetes: No  No results found for: HGBA1C   How often do you need to have someone help you when you read instructions, pamphlets, or other written materials from your doctor or pharmacy?: 1 - Never  Interpreter Needed?: No  Information entered by :: R. Cleva Camero LPN   Activities of Daily Living     08/15/2023    8:55 AM  In your present state of health, do you have any difficulty performing the following activities:  Hearing? 1  Comment wears aids  Vision? 0  Difficulty concentrating or making decisions? 0  Walking or climbing stairs? 1  Dressing or bathing? 0  Doing errands, shopping? 0  Preparing Food and eating ? N  Using the Toilet? N  In the past six months, have you accidently leaked urine? N  Do you have problems with loss of bowel control? N  Managing your Medications? N  Managing your Finances? N  Housekeeping or managing your Housekeeping? N    Patient Care Team: Bair, Kalpana, MD as PCP - General (Family Medicine)  Cindie Ole DASEN, MD as PCP - Electrophysiology (Cardiology) End, Lonni, MD as PCP - Cardiology  (Cardiology) Pyrtle, Gordy HERO, MD as Consulting Physician (Gastroenterology) Penne Knee, MD as Consulting Physician (Urology) Tamea Dedra CROME, MD as Consulting Physician (Pulmonary Disease)  I have updated your Care Teams any recent Medical Services you may have received from other providers in the past year.     Assessment:   This is a routine wellness examination for [place].  Hearing/Vision screen Hearing Screening - Comments:: Wears aids Vision Screening - Comments:: No correction   Goals Addressed             This Visit's Progress    Patient Stated       Wants to be able to breathe better       Depression Screen     08/19/2023    2:32 PM 10/16/2022    2:07 PM 06/03/2022    1:18 PM 12/30/2021    3:00 PM 12/16/2021   11:39 AM 12/17/2020    8:45 AM 12/14/2019    9:21 AM  PHQ 2/9 Scores  PHQ - 2 Score 0 0 0 0 0 0 0  PHQ- 9 Score 3 0 0 0 0 0 0    Fall Risk     08/15/2023    8:55 AM 10/16/2022    2:07 PM 06/03/2022    1:18 PM 12/30/2021    2:59 PM 12/16/2021   11:39 AM  Fall Risk   Falls in the past year? 0 0 0 0 0  Number falls in past yr: 0 0 0 0 0  Injury with Fall? 0 0 0 0 0  Risk for fall due to : No Fall Risks No Fall Risks No Fall Risks No Fall Risks No Fall Risks  Follow up Falls evaluation completed;Falls prevention discussed Falls evaluation completed Falls evaluation completed Falls evaluation completed  Falls evaluation completed      Data saved with a previous flowsheet row definition    MEDICARE RISK AT HOME:  Medicare Risk at Home Any stairs in or around the home?: (Patient-Rptd) Yes If so, are there any without handrails?: (Patient-Rptd) No Home free of loose throw rugs in walkways, pet beds, electrical cords, etc?: (Patient-Rptd) Yes Adequate lighting in your home to reduce risk of falls?: (Patient-Rptd) Yes Life alert?: (Patient-Rptd) No Use of a cane, walker or w/c?: (Patient-Rptd) No Grab bars in the bathroom?: (Patient-Rptd) No Shower chair  or bench in shower?: (Patient-Rptd) No Elevated toilet seat or a handicapped toilet?: (Patient-Rptd) No  TIMED UP AND GO:  Was the test performed?  No  Cognitive Function: 6CIT completed        08/19/2023    2:39 PM 04/16/2017    1:59 PM  6CIT Screen  What Year? 0 points 0 points  What month? 0 points 0 points  What time? 0 points 0 points  Count back from 20 0 points 0 points  Months in reverse 0 points 0 points  Repeat phrase 0 points 0 points  Total Score 0 points 0 points    Immunizations Immunization History  Administered Date(s) Administered   Fluad Quad(high Dose 65+) 11/01/2020, 10/29/2021   Fluad Trivalent(High Dose 65+) 10/16/2022   Influenza Inj Mdck Quad Pf 10/28/2017   Influenza Split 11/24/2013   Influenza, High Dose Seasonal PF 11/05/2016, 10/29/2017, 10/06/2018, 11/03/2019   Influenza,inj,Quad PF,6+ Mos 11/08/2014, 11/26/2015   Influenza-Unspecified 10/11/2013, 10/11/2013   PFIZER(Purple Top)SARS-COV-2 Vaccination 05/06/2019, 05/31/2019, 01/27/2020   PNEUMOCOCCAL CONJUGATE-20 06/03/2022  Pneumococcal Conjugate-13 04/18/2014   Pneumococcal Polysaccharide-23 11/26/2015   Respiratory Syncytial Virus Vaccine,Recomb Aduvanted(Arexvy) 10/14/2022   Tdap 02/13/2011   Zoster Recombinant(Shingrix) 12/01/2016, 03/02/2017   Zoster, Live 11/07/2010    Screening Tests Health Maintenance  Topic Date Due   Medicare Annual Wellness (AWV)  04/17/2018   COVID-19 Vaccine (4 - 2024-25 season) 10/12/2022   INFLUENZA VACCINE  09/11/2023   Pneumococcal Vaccine: 50+ Years  Completed   Zoster Vaccines- Shingrix  Completed   Hepatitis B Vaccines  Aged Out   HPV VACCINES  Aged Out   Meningococcal B Vaccine  Aged Out   DTaP/Tdap/Td  Discontinued   Colonoscopy  Discontinued   Hepatitis C Screening  Discontinued    Health Maintenance  Health Maintenance Due  Topic Date Due   Medicare Annual Wellness (AWV)  04/17/2018   COVID-19 Vaccine (4 - 2024-25 season) 10/12/2022    Health Maintenance Items Addressed: Patient declines covid vaccines  Additional Screening:  Vision Screening: Recommended annual ophthalmology exams for early detection of glaucoma and other disorders of the eye.  Up to date [facility] Would you like a referral to an eye doctor? No    Dental Screening: Recommended annual dental exams for proper oral hygiene  Community Resource Referral / Chronic Care Management: CRR required this visit?  No   CCM required this visit?  No   Plan:    I have personally reviewed and noted the following in the patient's chart:   Medical and social history Use of alcohol , tobacco or illicit drugs  Current medications and supplements including opioid prescriptions. Patient is not currently taking opioid prescriptions. Functional ability and status Nutritional status Physical activity Advanced directives List of other physicians Hospitalizations, surgeries, and ER visits in previous 12 months Vitals Screenings to include cognitive, depression, and falls Referrals and appointments  In addition, I have reviewed and discussed with patient certain preventive protocols, quality metrics, and best practice recommendations. A written personalized care plan for preventive services as well as general preventive health recommendations were provided to patient.   Angeline Fredericks, LPN   2/[id number]/7974   After Visit Summary: (MyChart) Due to this being a telephonic visit, the after visit summary with patients personalized plan was offered to patient via MyChart   Notes: Nothing significant to report at this time.

## 2023-08-19 NOTE — Patient Instructions (Signed)
 [name] , Thank you for taking time out of your busy schedule to complete your Annual Wellness Visit with me. I enjoyed our conversation and look forward to speaking with you again next year. I, as well as your care team,  appreciate your ongoing commitment to your health goals. Please review the following plan we discussed and let me know if I can assist you in the future. Your Game plan/ To Do List    Referrals: If you haven't heard from the office you've been referred to, please reach out to them at the phone provided. Remember to update your flu vaccines annually Follow up Visits: Next Medicare AWV with our clinical staff: 08/23/24 @ 1:40   Have you seen your provider in the last 6 months (3 months if uncontrolled diabetes)? Yes Next Office Visit with your provider: 12/08/23 The office will try to move this up sooner  Clinician Recommendations:  Aim for 30 minutes of exercise or brisk walking, 6-8 glasses of water, and 5 servings of fruits and vegetables each day.       This is a list of the screening recommended for you and due dates:  Health Maintenance  Topic Date Due   COVID-19 Vaccine (4 - 2024-25 season) 10/12/2022   Flu Shot  09/11/2023   Medicare Annual Wellness Visit  08/18/2024   Pneumococcal Vaccine for age over 27  Completed   Zoster (Shingles) Vaccine  Completed   Hepatitis B Vaccine  Aged Out   HPV Vaccine  Aged Out   Meningitis B Vaccine  Aged Out   DTaP/Tdap/Td vaccine  Discontinued   Colon Cancer Screening  Discontinued   Hepatitis C Screening  Discontinued    Advanced directives: (In Chart) A copy of your advanced directives are scanned into your chart should your provider ever need it. Advance Care Planning is important because it:  [x]  Makes sure you receive the medical care that is consistent with your values, goals, and preferences  [x]  It provides guidance to your family and loved ones and reduces their decisional burden about whether or not they are  making the right decisions based on your wishes.

## 2023-08-20 ENCOUNTER — Encounter: Payer: Self-pay | Admitting: Pulmonary Disease

## 2023-08-20 ENCOUNTER — Ambulatory Visit: Admitting: Pulmonary Disease

## 2023-08-20 VITALS — BP 130/60 | HR 77 | Temp 98.6°F | Ht 70.0 in | Wt 126.8 lb

## 2023-08-20 DIAGNOSIS — J4550 Severe persistent asthma, uncomplicated: Secondary | ICD-10-CM

## 2023-08-20 DIAGNOSIS — Z87891 Personal history of nicotine dependence: Secondary | ICD-10-CM

## 2023-08-20 DIAGNOSIS — J449 Chronic obstructive pulmonary disease, unspecified: Secondary | ICD-10-CM

## 2023-08-20 DIAGNOSIS — R64 Cachexia: Secondary | ICD-10-CM | POA: Diagnosis not present

## 2023-08-20 DIAGNOSIS — G4736 Sleep related hypoventilation in conditions classified elsewhere: Secondary | ICD-10-CM

## 2023-08-20 DIAGNOSIS — R0600 Dyspnea, unspecified: Secondary | ICD-10-CM

## 2023-08-20 DIAGNOSIS — J439 Emphysema, unspecified: Secondary | ICD-10-CM

## 2023-08-20 MED ORDER — ALBUTEROL SULFATE HFA 108 (90 BASE) MCG/ACT IN AERS: 2.0000 | INHALATION_SPRAY | Freq: Four times a day (QID) | RESPIRATORY_TRACT | 4 refills | Status: AC | PRN

## 2023-08-20 NOTE — Patient Instructions (Addendum)
 VISIT SUMMARY:  Today, we discussed your ongoing issues with COPD and asthma, particularly how hot weather affects your breathing. We also talked about your weight loss and strategies to gain weight, as well as your difficulties with nocturnal hypoxemia and stress management.  YOUR PLAN:  -COPD WITH ASTHMA OVERLAP: This is a chronic lung condition that makes it hard to breathe, especially in hot weather. We will refill your albuterol  prescription and advise you to use albuterol  before Otovel. We will also check on your overnight oximetry order and consider switching your oxygen  supplier to [facility]. Additionally, we will explore in-home pulmonary rehab with [facility].  -NOCTURNAL HYPOXEMIA: This means you have low oxygen  levels at night, which affects your sleep quality. We will check on your overnight oximetry order and consider switching your oxygen  supplier to [facility].  -PULMONARY CACHEXIA: This is weight loss due to the increased energy your body uses to breathe. We recommend increasing your intake of healthy fats, proteins, and complex carbohydrates, and suggest using protein supplements.  -STRESS MANAGEMENT AND BREATHING TECHNIQUES: Managing stress is important to prevent worsening of your breathing difficulties. We advise you to practice pursed lip breathing techniques and use your harmonica or acapella device for breathing exercises.  INSTRUCTIONS:  Please follow up on the overnight oximetry order and consider switching your oxygen  supplier to [facility]. Continue with the recommended dietary changes and breathing exercises. If you have any issues or concerns, please contact our office.

## 2023-08-20 NOTE — Progress Notes (Signed)
 Subjective:    Patient ID: Antonio [name], male    DOB: 1945-08-27, 78 y.o.   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  He was last seen here 21 May 2023.  Continues taking Ohtuvayre , notes benefit of the medication.  He is also on Dulera  twice a day, and Dupixent  for his severe asthma.   HPI Discussed the use of AI scribe software for clinical note transcription with the patient, who gave verbal consent to proceed.  History of Present Illness   Antonio [name] is an 78 year old male with COPD and asthma overlap who presents for follow-up of nocturnal hypoxemia.  He experiences difficulty breathing, particularly in hot weather, describing it as 'awful' and 'kills me'. He manages better in cooler conditions, such as in the mornings, and can perform activities like mowing the yard without issue.  He has been experiencing weight loss and wants to gain weight, mentioning advice to consume healthy fats and proteins.  He is currently using several medications for his condition, including Ohtuvayre , Dulera , and Dupixent . He uses albuterol  infrequently, about once or twice a day, and has used up one container, now on his second. He is intolerant of LAMA's, previously had issues with urinary retention when using Breztri  or Spiriva .  He underwent pulmonary rehabilitation two to three years ago but is not currently engaged in  any exercise regimen. He is willing to follow exercise instructions if provided and mentions having an acapella device at home to aid his breathing.  He does not wish to go through outpatient pulmonary rehab again but would consider an in-home program such as [facility].  He experiences anxiety over not having his nocturnal oxygen . He mentions issues with his oxygen  concentrator and is awaiting a new one.  An overnight oximetry was ordered however the DME company has not provided this to him.  This is necessary to qualify him for a new concentrator.  He has been experiencing irritability due to his inability to perform activities he used to do and notes that stress exacerbates his breathing difficulties.  He mentions having an acapella device at home to assist with pursed lip breathing.  Needs his albuterol  refill today.     DATA 06/09/2014 pulmonary function test> ratio 51%, FEV1 1.47 L (40% predicted), total lung capacity 5.43 L (71% predicted). DLCO 16.26 (44% predicted) December 2017 lung function testing showed ratio 50%, FEV1 1.47 L 43% predicted, FVC 2.96 L 67% predicted March 2020 pulmonary function testing ratio 38%, FEV1 0.97 L 31% predicted, total lung capacity 8.44 L 119% predicted, residual volume 198% predicted, DLCO 14.0 55% predicted March 2020 high-resolution CT scan of the chest images : Centrilobular emphysema, no interstitial lung disease, aortic atherosclerosis 04/20/2018-eosinophils relative 7.3, eosinophils absolute 0.6 04/20/2018-IgE-67 01/20/2019-SARS-CoV-2-detected 03/06/2021 alpha 1 antitrypsin: Phenotype MM, level 165 03/11/2021 echocardiogram: LVEF 55%, right ventricular function normal.  No wall motion abnormalities. 04/03/2021 PFT: FEV1 1.09 L or 36% predicted, FVC 2.60 L or 61% predicted, FEV1/FVC 42%, no bronchodilator response. Lung  volumes showed moderate air trapping.  Diffusion capacity severely decreased at 49%. 05/22/2022 chest x-ray 1 view: Patchy airspace disease  in the medial right base concerning for pneumonia. 11/12/2022 chest x-ray PA and lateral: No overt infiltrate noted, changes of severe COPD noted. 01/07/2023 chest x-ray PA and lateral: Emphysema without evidence of acute cardiopulmonary disease.  Review of Systems A 10 point review of systems was performed and it is as noted above otherwise negative.   Patient Active Problem List   Diagnosis Date Noted   Hyperlipidemia 03/02/2023   Vitamin D  deficiency 03/02/2023   Vitamin B 12 deficiency 03/02/2023   Hemorrhoids 03/02/2023   Sleep disturbances 03/02/2023   Weight loss 03/02/2023   Demand ischemia (HCC) 05/24/2022   Pure hypercholesterolemia 05/24/2022   Primary hypertension 05/24/2022   Atrial fibrillation with rapid ventricular response (HCC) 05/22/2022   Sepsis due to pneumonia (HCC) 05/22/2022   CAP (community acquired pneumonia) 05/22/2022   Nocturnal hypoxemia 05/22/2022   BPH (benign prostatic hyperplasia) 05/22/2022   History of urinary retention 05/22/2022   Coronary artery disease involving native coronary artery of native heart without angina pectoris 03/21/2022   Precordial pain 02/19/2022   Atypical chest pain 12/21/2021   DOE (dyspnea on exertion) 01/24/2021   Secondary hypercoagulable state (HCC) 12/19/2020   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   PAD ss/p SFA stent(peripheral artery disease) 11/08/2019   Paroxysmal atrial fibrillation (HCC) 09/02/2019   Stiffness of finger joint of left hand 04/06/2019   History of COVID-19 01/20/2019   Severe persistent asthma without complication 01/20/2019   Medication management 09/20/2018   Stage 3 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 (HCC) 09/20/2018   Increased eosinophils in the blood 06/21/2018   Diffusion capacity of lung (dl), decreased 98/69/7979   COPD with acute exacerbation (HCC) 06/22/2017   Atrial flutter, paroxysmal (HCC) 10/17/2016   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/2016   SVT (supraventricular tachycardia) (HCC) 10/17/2016    Ulcerative colitis (HCC) 05/28/2016   Cervical stenosis of spinal canal 05/05/2016   Carotid stenosis 05/02/2016   Atherosclerosis of native arteries of extremity with intermittent claudication (HCC) 05/02/2016   Cramping of hands 01/10/2016   Bilateral carpal tunnel syndrome 12/06/2015   Centrilobular Emphysema 04/25/2014   Former smoker 04/25/2014   Tobacco abuse 04/25/2014    Social History   Tobacco Use   Smoking status: Former    Current packs/day: 0.00    Average packs/day: 0.5 packs/day for 35.0 years (17.5 ttl pk-yrs)    Types: Cigarettes, Cigars    Start date: 06/10/1976    Quit date: 06/11/2011    Years since quitting: 12.2   Smokeless tobacco: Never   Tobacco comments:    Former smoker 12/19/2020  Substance Use Topics   Alcohol  use: Yes    Alcohol /week: 6.0 standard drinks of alcohol     Types: 6 Shots of liquor per week    Comment: wine, liquor, or beer    Allergies  Allergen Reactions   Penicillins Rash    Has patient had a PCN reaction causing immediate rash, facial/tongue/throat swelling, SOB or lightheadedness with hypotension:Yes Has patient had a PCN reaction causing severe rash involving mucus membranes or skin necrosis:Yes Has patient had a PCN reaction that required hospitalization:No Has patient had a PCN reaction occurring within the last 10 years:No If all of the above answers are NO, then may proceed with Cephalosporin use.     Current Meds  Medication Sig   albuterol  (PROVENTIL ) (2.5 MG/3ML) 0.083% nebulizer solution Take 3 mLs (2.5 mg total) by  nebulization every 6 (six) hours as needed for wheezing or shortness of breath.   albuterol  (VENTOLIN  HFA) 108 (90 Base) MCG/ACT inhaler Inhale 2 puffs into the lungs every 6 (six) hours as needed.   Azelastine  HCl 137 MCG/SPRAY SOLN Place 1 spray into both nostrils 2 (two) times daily.   diltiazem  (CARDIZEM  CD) 120 MG 24 hr capsule TAKE 1 CAPSULE BY MOUTH EVERY DAY   dupilumab  (DUPIXENT ) 300 MG/2ML  prefilled syringe Inject 300 mg into the skin every 14 (fourteen) days.   flecainide  (TAMBOCOR ) 50 MG tablet TAKE 1 TABLET BY MOUTH TWICE A DAY   furosemide  (LASIX ) 20 MG tablet TAKE 1 TABLET BY MOUTH DAILY AS NEEDED (FOR SWELLING OR WEIGHT GAIN OF 3 LBS OVERNIGHT OR 5 LBS IN A WEEK). (Patient taking differently: daily as needed.)   hydrocortisone  (ANUSOL -HC) 25 MG suppository Place 1 suppository (25 mg total) rectally 2 (two) times daily.   mesalamine  (LIALDA ) 1.2 g EC tablet TAKE 4 TABLETS BY MOUTH DAILY WITH BREAKFAST   mometasone -formoterol  (DULERA ) 200-5 MCG/ACT AERO Inhale 2 puffs into the lungs 2 (two) times daily. Rinse mouth well after use.   nitroGLYCERIN  (NITROSTAT ) 0.4 MG SL tablet Place 1 tablet (0.4 mg total) under the tongue every 5 (five) minutes as needed for chest pain.   OHTUVAYRE  3 MG/2.5ML SUSP Inhale 3 mg into the lungs in the morning and at bedtime.   rivaroxaban  (XARELTO ) 20 MG TABS tablet Take 1 tablet (20 mg total) by mouth daily with supper.   rosuvastatin  (CRESTOR ) 10 MG tablet TAKE 1 TABLET BY MOUTH DAILY   Vitamin D , Ergocalciferol , (DRISDOL ) 1.25 MG (50000 UNIT) CAPS capsule Take 1 capsule (50,000 Units total) by mouth every 7 (seven) days.    Immunization History  Administered Date(s) Administered   Fluad Quad(high Dose 65+) 11/01/2020, 10/29/2021   Fluad Trivalent(High Dose 65+) 10/16/2022   Influenza Inj Mdck Quad Pf 10/28/2017   Influenza Split 11/24/2013   Influenza, High Dose Seasonal PF 11/05/2016, 10/29/2017, 10/06/2018, 11/03/2019   Influenza,inj,Quad PF,6+ Mos 11/08/2014, 11/26/2015   Influenza-Unspecified 10/11/2013, 10/11/2013   PFIZER(Purple Top)SARS-COV-2 Vaccination 05/06/2019, 05/31/2019, 01/27/2020   PNEUMOCOCCAL CONJUGATE-20 06/03/2022   Pneumococcal Conjugate-13 04/18/2014   Pneumococcal Polysaccharide-23 11/26/2015   Respiratory Syncytial Virus Vaccine,Recomb Aduvanted(Arexvy) 10/14/2022   Tdap 02/13/2011   Zoster Recombinant(Shingrix)  12/01/2016, 03/02/2017   Zoster, Live 11/07/2010        Objective:     BP 130/60 (BP Location: Left Arm, Patient Position: Sitting, Cuff Size: Normal)   Pulse 77   Temp 98.6 F (37 C) (Oral)   Ht 5' 10 (1.778 m)   Wt 126 lb 12.8 oz (57.5 kg)   SpO2 94%   BMI 18.19 kg/m   SpO2: 94 %  GENERAL: Thin well-developed gentleman, no acute distress.  Well-groomed, fully ambulatory without assistance.  No conversational dyspnea. HEAD: Normocephalic, atraumatic.  EYES: Pupils equal, round, reactive to light.  No scleral icterus.  MOUTH: Dentition intact.  Oral mucosa moist.  No thrush. NECK: Supple. No thyromegaly. Trachea midline. No JVD.  No adenopathy. PULMONARY: Distant breath sounds bilaterally.  Coarse breath sounds, no wheezes or rhonchi noted. CARDIOVASCULAR: S1 and S2.  Regular rate and rhythm.  No rubs, murmurs or gallops heard. ABDOMEN: Benign. MUSCULOSKELETAL: No joint deformity, no clubbing, no edema.  Muscle tone improved.   NEUROLOGIC: No overt focal deficit, no gait disturbance, speech is fluent. SKIN: Intact,warm,dry. PSYCH: Mood and behavior normal.  Ambulatory oxymetry was performed today:  At rest on room air oxygen   saturation was 99%, the patient ambulated at a normal pace, completed 3 laps, O2 nadir 95%, moderate shortness of breath.  Resting heart rate was 63 bpm at maximum for this exercise 89 bpm.  No significant O2 desaturation during exercise.       Assessment & Plan:     ICD-10-CM   1. Stage 3 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 (HCC)  J44.9 AMB referral to pulmonary rehabilitation    2. Severe persistent asthma without complication  J45.50     3. Nocturnal hypoxemia due to emphysema (HCC)  J43.9    G47.36     4. Dyspnea, unspecified type  R06.00     5. 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    Dispense:  8 g    Refill:  4   Discussion:    COPD with asthma overlap Chronic condition with symptoms exacerbated by hot weather. Current medications include Otovel, Dulera , and Dupixent . Albuterol  is used rarely, about once or twice a day. Previous issues with medications causing urinary retention, such as Breztri  and Spiriva , limit treatment options. - Refill albuterol  prescription - Advise using albuterol  before Ohtuvayre  - Check on overnight oximetry order - Consider switching oxygen  supplier to [facility] - Explore in-home pulmonary rehab with [facility]  Nocturnal hypoxemia Experiencing nocturnal hypoxemia, leading to poor sleep quality. Previous issues with oxygen  supplier [employer]. - Check on overnight oximetry order - Consider switching oxygen  supplier to [facility]  Pulmonary cachexia Weight loss likely due to increased energy expenditure from breathing effort. - Advise increasing intake of healthy fats, proteins, and complex carbohydrates - Recommend protein supplements  Stress management and breathing techniques Discussed the importance of managing stress to prevent exacerbation of breathing difficulties. - Advise on pursed lip breathing techniques - Recommend using a harmonica or acapella device for breathing exercises     Advised if symptoms do not improve or worsen, to please contact office for sooner follow up or seek emergency care.    I spent 40 minutes of dedicated to the care of this patient on the date of this encounter to include pre-visit review of records, face-to-face time with the patient discussing conditions above, post visit ordering of testing, clinical documentation with the electronic health record, making appropriate referrals as  documented, and communicating necessary findings to members of the patients care team.  C. Leita Sanders, MD Advanced Bronchoscopy P[facility]    *This note was generated using voice recognition software/Dragon and/or AI transcription program.  Despite best efforts to proofread, errors can occur which can change the meaning. Any transcriptional errors that result from this process are unintentional and may not be fully corrected at the time of dictation.

## 2023-08-21 ENCOUNTER — Telehealth: Payer: Self-pay

## 2023-08-21 NOTE — Telephone Encounter (Signed)
 Entered in error

## 2023-08-25 DIAGNOSIS — R0902 Hypoxemia: Secondary | ICD-10-CM | POA: Diagnosis not present

## 2023-08-25 DIAGNOSIS — G4730 Sleep apnea, unspecified: Secondary | ICD-10-CM | POA: Diagnosis not present

## 2023-08-26 ENCOUNTER — Telehealth: Payer: Self-pay

## 2023-08-26 DIAGNOSIS — G4736 Sleep related hypoventilation in conditions classified elsewhere: Secondary | ICD-10-CM

## 2023-08-26 NOTE — Telephone Encounter (Signed)
 Overnight pulse oximetry report  Test started 08/25/2023 Test stopped 08/26/2023  Dr. Tamea reviewed reports.  Patient qualifies for nocturnal oxygen  Dx: Hypoxemia due to emphysema.  Oxygen  2L at bedtime.   Patient advised.  DME order placed.

## 2023-08-30 DIAGNOSIS — J449 Chronic obstructive pulmonary disease, unspecified: Secondary | ICD-10-CM | POA: Diagnosis not present

## 2023-09-03 DIAGNOSIS — J449 Chronic obstructive pulmonary disease, unspecified: Secondary | ICD-10-CM | POA: Diagnosis not present

## 2023-09-07 ENCOUNTER — Encounter: Payer: Self-pay | Admitting: Pulmonary Disease

## 2023-09-10 ENCOUNTER — Telehealth: Payer: Self-pay | Admitting: Internal Medicine

## 2023-09-10 DIAGNOSIS — J449 Chronic obstructive pulmonary disease, unspecified: Secondary | ICD-10-CM | POA: Diagnosis not present

## 2023-09-10 NOTE — Telephone Encounter (Signed)
 Patient requesting f/u call to discuss hem banding. Please advise.

## 2023-09-10 NOTE — Telephone Encounter (Signed)
 Pt scheduled for hem banding 10/20/23@3 :20pm. Pt aware of appt.

## 2023-09-10 NOTE — Telephone Encounter (Signed)
 Patient is calling back from an earlier request below. Please advise.

## 2023-09-15 DIAGNOSIS — J449 Chronic obstructive pulmonary disease, unspecified: Secondary | ICD-10-CM | POA: Diagnosis not present

## 2023-09-16 ENCOUNTER — Ambulatory Visit (INDEPENDENT_AMBULATORY_CARE_PROVIDER_SITE_OTHER)

## 2023-09-16 VITALS — BP 120/70 | HR 71 | Temp 98.2°F | Ht 70.0 in | Wt 127.8 lb

## 2023-09-16 DIAGNOSIS — E785 Hyperlipidemia, unspecified: Secondary | ICD-10-CM

## 2023-09-16 DIAGNOSIS — I7300 Raynaud's syndrome without gangrene: Secondary | ICD-10-CM | POA: Insufficient documentation

## 2023-09-16 DIAGNOSIS — E559 Vitamin D deficiency, unspecified: Secondary | ICD-10-CM | POA: Diagnosis not present

## 2023-09-16 DIAGNOSIS — R634 Abnormal weight loss: Secondary | ICD-10-CM | POA: Diagnosis not present

## 2023-09-16 DIAGNOSIS — I480 Paroxysmal atrial fibrillation: Secondary | ICD-10-CM

## 2023-09-16 DIAGNOSIS — R209 Unspecified disturbances of skin sensation: Secondary | ICD-10-CM | POA: Insufficient documentation

## 2023-09-16 DIAGNOSIS — M545 Low back pain, unspecified: Secondary | ICD-10-CM | POA: Diagnosis not present

## 2023-09-16 DIAGNOSIS — I10 Essential (primary) hypertension: Secondary | ICD-10-CM

## 2023-09-16 MED ORDER — DICLOFENAC SODIUM 1 % EX GEL: 4.0000 g | Freq: Four times a day (QID) | CUTANEOUS | 1 refills | Status: DC | PRN

## 2023-09-16 NOTE — Progress Notes (Signed)
 Established Patient Office Visit TOC from Dr. Hope    Subjective  Patient ID: Antonio [name], male    DOB: March 31, 1945  Age: 78 y.o. MRN: [id number]  Chief Complaint  Patient presents with   Establish Care   Back Pain    He  has a past medical history of Allergy, Calf pain, COPD (chronic obstructive pulmonary disease) (HCC), COVID-19, Dyspnea, Essential hypertension, History of echocardiogram, Internal hemorrhoids, Mixed hyperlipidemia, Muscle cramp, Nonobstructive CAD (coronary artery disease), PAD (peripheral artery disease) (HCC), Paroxysmal atrial fibrillation (HCC) (08/2019), PONV (postoperative nausea and vomiting), Tobacco abuse (04/25/2014), Tubular adenoma of colon, and Ulcerative colitis (HCC).  HPI Discussed the use of AI scribe software for clinical note transcription with the patient, who gave verbal consent to proceed.  History of Present Illness Antonio [name] is an 78 year old male who presents with his wife to establish care.    He reports of intermittent lower back pain without radiating pain, or lower legs weakness/numbness/tinglings. Pain does not wake him up from sleep. Usually causes morning stiffness. He takes Acetaminophen  prn which helps improving pain.   He has a history of ulcerative colitis (on mesalamine ), hemorrhoids. He has undergone banding procedures, in the past. He is using prn hydrocortisone  cream and has a f/u appointment with Dr. Albertus (10/20/23).   He also reports of feeling full with eating large meal. He denies dysphagia, GERD symptoms. He consumes two to three meals daily, including breakfast with cereal, eggs, and sometimes oatmeal, sandwiches for lunch, and a larger dinner. He occasionally drinks alcohol , primarily scotch or gin.   His COPD and he requires nocturnal oxygen  at two liters. He participates in pulmonary physical therapy and follows up with pulmonologist Dr. Tamea regularly.   For atrial fibrillation, he is on  diltiazem , flecainide  and Xarelto . He also takes Crestor  10 mg for cholesterol. He has nitroglycerin  prescribed. No chest pain or need for nitroglycerin  use.  He has a history of low vitamin D , which was checked in February. Is taking 50000 units vitamin D  once weekly.    ROS As per HPI    Objective:     BP 120/70 (BP Location: Right Arm, Patient Position: Sitting, Cuff Size: Small)   Pulse 71   Temp 98.2 F (36.8 C) (Oral)   Ht 5' 10 (1.778 m)   Wt 127 lb 12.8 oz (58 kg)   SpO2 97%   BMI 18.34 kg/m      08/19/2023    2:32 PM 10/16/2022    2:07 PM 06/03/2022    1:18 PM  Depression screen PHQ 2/9  Decreased Interest 0 0 0  Down, Depressed, Hopeless 0 0 0  PHQ - 2 Score 0 0 0  Altered sleeping 2 0 0  Tired, decreased energy 1 0 0  Change in appetite 0 0 0  Feeling bad or failure about yourself  0 0 0  Trouble concentrating 0 0 0  Moving slowly or fidgety/restless 0 0 0  Suicidal thoughts 0 0 0  PHQ-9 Score 3 0 0  Difficult doing work/chores Not difficult at all Not difficult at all Not difficult at all      10/16/2022    2:07 PM 06/03/2022    1:19 PM 12/30/2021    3:00 PM 12/16/2021   11:39 AM  GAD 7 : Generalized Anxiety Score  Nervous, Anxious, on Edge 0 0 0 0  Control/stop worrying 0 0 0 0  Worry too much - different things 0 0 0 0  Trouble relaxing 0 0 0 0  Restless 0 0 0 0  Easily annoyed or irritable 0 0 0 0  Afraid - awful might happen 0 0 0 0  Total GAD 7 Score 0 0 0 0  Anxiety Difficulty Not difficult at all Not difficult at all Not difficult at all Not difficult at all      08/19/2023    2:32 PM 10/16/2022    2:07 PM 06/03/2022    1:18 PM  Depression screen PHQ 2/9  Decreased Interest 0 0 0  Down, Depressed, Hopeless 0 0 0  PHQ - 2 Score 0 0 0  Altered sleeping 2 0 0  Tired, decreased energy 1 0 0  Change in appetite 0 0 0  Feeling bad or failure about yourself  0 0 0  Trouble concentrating 0 0 0  Moving slowly or fidgety/restless 0 0 0  Suicidal  thoughts 0 0 0  PHQ-9 Score 3 0 0  Difficult doing work/chores Not difficult at all Not difficult at all Not difficult at all      10/16/2022    2:07 PM 06/03/2022    1:19 PM 12/30/2021    3:00 PM 12/16/2021   11:39 AM  GAD 7 : Generalized Anxiety Score  Nervous, Anxious, on Edge 0 0 0 0  Control/stop worrying 0 0 0 0  Worry too much - different things 0 0 0 0  Trouble relaxing 0 0 0 0  Restless 0 0 0 0  Easily annoyed or irritable 0 0 0 0  Afraid - awful might happen 0 0 0 0  Total GAD 7 Score 0 0 0 0  Anxiety Difficulty Not difficult at all Not difficult at all Not difficult at all Not difficult at all   SDOH Screenings   Food Insecurity: No Food Insecurity (08/19/2023)  Housing: Unknown (08/19/2023)  Transportation Needs: No Transportation Needs (08/15/2023)  Utilities: Not At Risk (08/19/2023)  Alcohol  Screen: Low Risk  (08/15/2023)  Depression (PHQ2-9): Low Risk  (08/19/2023)  Financial Resource Strain: Low Risk  (08/15/2023)  Physical Activity: Insufficiently Active (08/15/2023)  Social Connections: Socially Integrated (08/15/2023)  Stress: Stress Concern Present (08/15/2023)  Tobacco Use: Medium Risk (09/16/2023)  Health Literacy: Adequate Health Literacy (08/19/2023)     Physical Exam Constitutional:      General: He is not in acute distress.    Appearance: Normal appearance. He is not toxic-appearing.  HENT:     Head: Normocephalic and atraumatic.     Mouth/Throat:     Mouth: Mucous membranes are moist.     Pharynx: Oropharynx is clear.  Eyes:     Conjunctiva/sclera: Conjunctivae normal.  Neck:     Thyroid : No thyroid  mass or thyroid  tenderness.  Cardiovascular:     Rate and Rhythm: Normal rate and regular rhythm.  Pulmonary:     Effort: Pulmonary effort is normal.     Breath sounds: Normal breath sounds.  Abdominal:     General: Bowel sounds are normal.     Palpations: Abdomen is soft.     Tenderness: There is no guarding.  Musculoskeletal:     Cervical back: Neck supple.  No rigidity.     Lumbar back: Spasms (b/l lumbar paraspinal muscle tenderness on palpation) present. Normal range of motion. Negative right straight leg raise test and negative left straight leg raise test.     Right lower leg: No edema.     Left lower leg: No edema.  Skin:    General:  Skin is warm.  Neurological:     Mental Status: He is alert and oriented to person, place, and time.  Psychiatric:        Mood and Affect: Mood normal.        Behavior: Behavior normal.        No results found for any visits on 09/16/23.  The ASCVD Risk score (Arnett DK, et al., 2019) failed to calculate for the following reasons:   The 2019 ASCVD risk score is only valid for ages 27 to 50     Assessment & Plan:   Lumbar back pain Assessment & Plan: Likely MSK related, palpation of bilateral paraspinal lumbar muscle causing discomfort.  Recommend trying ice/heat, topical diclofenac  gel up to 4 times a day, acetaminophen  500 mg every 8 hourly prn, continue mobility exercise. If pain persists, worsens or wakes patient up at night further evaluation recommended. Consider PT/imaging in the future.   Orders: -     Diclofenac  Sodium; Apply 4 g topically 4 (four) times daily as needed.  Dispense: 100 g; Refill: 1  Vitamin D  deficiency Assessment & Plan: Evident on lab from 03/2023. Is done with prescription strength vitamin D  50000 units weekly. Repeat vitamin D  level today, transition to OTC 800 units vitamin D  daily if lab shows normalized vitamin D  level.   Orders: -     VITAMIN D  25 Hydroxy (Vit-D Deficiency, Fractures)  Weight loss Assessment & Plan: Patient was hospitalized for sepsis from pneumonia in 05/2022 and has noted having hard time gaining weight. Likely multifactorial COPD, multiple cardiovascular co-morbidities, ulcerative colitis, hyperthyroidism, idiopathic, inflammation. Check TSH today. Recommend balanced, nutrients dense meal three times a day with healthy snacks in between  meals. Also recommend discussing this during upcoming GI visit. I will continue to monitor weight, we also discussed potentially seeing dietitian if this continues to become a problem.  Wt Readings from Last 3 Encounters:  09/16/23 127 lb 12.8 oz (58 kg)  08/20/23 126 lb 12.8 oz (57.5 kg)  08/19/23 135 lb (61.2 kg)     Orders: -     TSH    Return in about 6 months (around 03/18/2024) for Chronic follow up .   Luke Shade, MD

## 2023-09-16 NOTE — Assessment & Plan Note (Addendum)
 Patient was hospitalized for sepsis from pneumonia in 05/2022 and has noted having hard time gaining weight. Likely multifactorial COPD, multiple cardiovascular co-morbidities, ulcerative colitis, hyperthyroidism, idiopathic, inflammation. Check TSH today. Recommend balanced, nutrients dense meal three times a day with healthy snacks in between meals. Also recommend discussing this during upcoming GI visit. I will continue to monitor weight, we also discussed potentially seeing dietitian if this continues to become a problem.  Wt Readings from Last 3 Encounters:  09/16/23 127 lb 12.8 oz (58 kg)  08/20/23 126 lb 12.8 oz (57.5 kg)  08/19/23 135 lb (61.2 kg)

## 2023-09-16 NOTE — Patient Instructions (Addendum)
-   I will update you on your lab results.  - You can use heating pack/ice pack on your lower back,

## 2023-09-16 NOTE — Assessment & Plan Note (Signed)
 Likely MSK related, palpation of bilateral paraspinal lumbar muscle causing discomfort.  Recommend trying ice/heat, topical diclofenac  gel up to 4 times a day, acetaminophen  500 mg every 8 hourly prn, continue mobility exercise. If pain persists, worsens or wakes patient up at night further evaluation recommended. Consider PT/imaging in the future.

## 2023-09-16 NOTE — Assessment & Plan Note (Signed)
 Evident on lab from 03/2023. Is done with prescription strength vitamin D  50000 units weekly. Repeat vitamin D  level today, transition to OTC 800 units vitamin D  daily if lab shows normalized vitamin D  level.

## 2023-09-17 ENCOUNTER — Telehealth: Payer: Self-pay

## 2023-09-17 ENCOUNTER — Ambulatory Visit: Payer: Self-pay

## 2023-09-17 DIAGNOSIS — J449 Chronic obstructive pulmonary disease, unspecified: Secondary | ICD-10-CM | POA: Diagnosis not present

## 2023-09-17 DIAGNOSIS — R7989 Other specified abnormal findings of blood chemistry: Secondary | ICD-10-CM

## 2023-09-17 LAB — TSH: TSH: 0.9 u[IU]/mL (ref 0.35–5.50)

## 2023-09-17 LAB — VITAMIN D 25 HYDROXY (VIT D DEFICIENCY, FRACTURES): VITD: 105.83 ng/mL (ref 30.00–100.00)

## 2023-09-17 NOTE — Progress Notes (Signed)
 Please let the patient know his vitamin D  is elevated. Recommend discontinuing all vitamin D  supplement at this time. Repeat vitamin D  level in 4 weeks. Recommend lab only appointment in 4 weeks. His TSH is within normal range.   Luke Shade, MD

## 2023-09-17 NOTE — Telephone Encounter (Signed)
 CRITICAL VALUE STICKER  CRITICAL VALUE: HIGH VITAMIN D  105.83  RECEIVER (on-site recipient of call): Stephanne Greeley K, RMA  DATE & TIME NOTIFIED: 3:09PM 09/17/23  MESSENGER (representative from lab): Saa  MD NOTIFIED: Dr. Abbey pcp  TIME OF NOTIFICATION: 3:15pm

## 2023-09-18 ENCOUNTER — Ambulatory Visit: Admitting: Cardiology

## 2023-09-22 DIAGNOSIS — J449 Chronic obstructive pulmonary disease, unspecified: Secondary | ICD-10-CM | POA: Diagnosis not present

## 2023-09-24 DIAGNOSIS — J449 Chronic obstructive pulmonary disease, unspecified: Secondary | ICD-10-CM | POA: Diagnosis not present

## 2023-09-25 ENCOUNTER — Telehealth: Payer: Self-pay | Admitting: Internal Medicine

## 2023-09-25 MED ORDER — HYDROCORTISONE ACETATE 25 MG RE SUPP: 25.0000 mg | Freq: Two times a day (BID) | RECTAL | 0 refills | Status: DC

## 2023-09-25 NOTE — Telephone Encounter (Signed)
 Hydrocortisone  suppositories sent to [facility] as per patient request.

## 2023-09-25 NOTE — Telephone Encounter (Signed)
 Inbound call from patient stating he needs a refill on Hydrocortisone  medication. Would like it sent to pharmacy on file.  Patient also stated he has an upcoming appointment for an hem banding on September 9th.  Please advise  Thank you

## 2023-10-01 ENCOUNTER — Other Ambulatory Visit: Payer: Self-pay

## 2023-10-01 ENCOUNTER — Telehealth: Payer: Self-pay | Admitting: Pulmonary Disease

## 2023-10-01 ENCOUNTER — Other Ambulatory Visit (HOSPITAL_COMMUNITY): Payer: Self-pay

## 2023-10-01 DIAGNOSIS — J449 Chronic obstructive pulmonary disease, unspecified: Secondary | ICD-10-CM | POA: Diagnosis not present

## 2023-10-01 DIAGNOSIS — J4550 Severe persistent asthma, uncomplicated: Secondary | ICD-10-CM

## 2023-10-01 MED ORDER — DUPIXENT 300 MG/2ML ~~LOC~~ SOSY
300.0000 mg | PREFILLED_SYRINGE | SUBCUTANEOUS | 1 refills | Status: AC
  Filled 2023-10-01 – 2023-10-13 (×2): qty 12, 84d supply, fill #0
  Filled 2024-01-19: qty 12, 84d supply, fill #1

## 2023-10-01 NOTE — Telephone Encounter (Signed)
 Pt needs refill rx for his dupixent .

## 2023-10-01 NOTE — Telephone Encounter (Signed)
 Refill for Dupixent  sent to [facility]  Dose: 300mg  subcut every 14 days  Last OV on 08/20/2023 Next OV on 10/28/2023  Sherry Pennant, PharmD, MPH, BCPS, CPP Clinical Pharmacist (Rheumatology and Pulmonology)

## 2023-10-04 NOTE — Progress Notes (Unsigned)
 Electrophysiology Clinic Note    Date:  10/05/2023  Patient ID:  Antonio [name], Antonio [name] Jan 05, 1946, MRN [id number] PCP:  Abbey Bruckner, MD  Cardiologist:  Lonni Hanson, MD   Cardiology APP:  Gerard Frederick, NP  Electrophysiologist:  OLE ONEIDA HOLTS, MD  Electrophysiology APP:  Chynna Buerkle, NP     Discussed the use of AI scribe software for clinical note transcription with the patient, who gave verbal consent to proceed.   Patient Profile    Chief Complaint: AFib, flecainide  follow-up  History of Present Illness: Antonio [name] is a 78 y.o. male with PMH notable for parox AFib, non-obs CAD, PAD s/p L SFA stenting, HTN, HLD, COPD, severe asthma; seen today for OLE ONEIDA HOLTS, MD for routine electrophysiology followup.   He is not a candidate for invasive EP procedures d/t severe lung disease.  I last saw him 05/2023 where he was doing well without AF. He last saw NP Hammock where he was also doing well.   On follow-up today, he has not had any AF episodes that he is aware of. He has had increased SOB two days ago and earlier this morning, believes it is d/t increased pollen. He has not used his PRN home oxygen . He continues to take xarelto  daily, no bleeding concerns.   He has lab appt with PCP next week.     Arrhythmia/Device History Flecainide     ROS:  Please see the history of present illness. All other systems are reviewed and otherwise negative.    Physical Exam    VS:  BP (!) 142/62 (BP Location: Left Arm, Patient Position: Sitting, Cuff Size: Normal)   Pulse 70   Ht 5' 11 (1.803 m)   Wt 129 lb 6.4 oz (58.7 kg)   SpO2 94%   BMI 18.05 kg/m  BMI: Body mass index is 18.05 kg/m.      Wt Readings from Last 3 Encounters:  10/05/23 129 lb 6.4 oz (58.7 kg)  09/16/23 127 lb 12.8 oz (58 kg)  08/20/23 126 lb 12.8 oz (57.5 kg)     GEN- The patient is well appearing, alert and oriented x 3 today.   Lungs- Clear to ausculation bilaterally,  increased work of breathing Heart- Regular rate and rhythm, no murmurs, rubs or gallops Extremities- No peripheral edema, warm, dry  Studies Reviewed   Previous EP, cardiology notes.    EKG is ordered. Personal review of EKG from today shows:    EKG Interpretation Date/Time:  Monday October 05 2023 10:31:08 EDT Ventricular Rate:  70 PR Interval:  192 QRS Duration:  76 QT Interval:  364 QTC Calculation: 393 R Axis:   57  Text Interpretation: Sinus rhythm with Premature atrial complexes Low voltage QRS Confirmed by Demitri Kucinski [phone]) on 10/05/2023 10:38:13 AM    Coronary CT, 03/06/2022 1. Coronary calcium  score of 251. This was 42nd percentile for age and sex matched control.  2. Normal coronary origin with left dominance.  3. Mild distal LM and proximal LAD stenosis (25-49%)  4. CAD-RADS 2. Mild non-obstructive CAD (25-49%). Consider non-atherosclerotic causes of chest pain. Consider preventive therapy and risk factor modification.   TTE, 03/11/2021  1. Left ventricular ejection fraction, by estimation, is 55%. Left ventricular ejection fraction by 2D MOD biplane is 54.2 %. The left ventricle has normal function. The left ventricle has no regional wall motion abnormalities. Left ventricular diastolic parameters were normal.   2. Right ventricular systolic function is normal. The right ventricular  size is normal.   3. The mitral valve is normal in structure. No evidence of mitral valve regurgitation.   4. The aortic valve is tricuspid. Aortic valve regurgitation is not visualized.   5. The inferior vena cava is normal in size with greater than 50% respiratory variability, suggesting right atrial pressure of 3 mmHg.       Assessment and Plan     #) Parox AFib #) flecainide  monitoring No AFib episodes EKG with stable intervals on flecainide  Continue 50mg  flecainide  BID + 120mg  diltiazem  daily Update mag next week with PCP labs   #) Hypercoag d/t afib CHA2DS2-VASc Score = at  least 4 [CHF History: 0, HTN History: 1, Diabetes History: 0, Stroke History: 0, Vascular Disease History: 1, Age Score: 2, Gender Score: 0].  Therefore, the patient's annual risk of stroke is 4.8 %.    Stroke ppx - 20mg  xarelto  daily, appropriately dosed for CrCl > 50 No bleeding concerns Update CBC next week with PCP labs         Current medicines are reviewed at length with the patient today.   The patient does not have concerns regarding his medicines.  The following changes were made today:  none  Labs/ tests ordered today include:  Orders Placed This Encounter  Procedures   CBC   Magnesium    EKG 12-Lead     Disposition: Follow up with Dr. Cindie or EP APP in 6 months   Signed, Chantal Needle, NP  10/05/23  12:27 PM  Electrophysiology [facility]

## 2023-10-05 ENCOUNTER — Ambulatory Visit: Attending: Cardiology | Admitting: Cardiology

## 2023-10-05 VITALS — BP 142/62 | HR 70 | Ht 71.0 in | Wt 129.4 lb

## 2023-10-05 DIAGNOSIS — Z5181 Encounter for therapeutic drug level monitoring: Secondary | ICD-10-CM | POA: Diagnosis not present

## 2023-10-05 DIAGNOSIS — D6869 Other thrombophilia: Secondary | ICD-10-CM | POA: Diagnosis not present

## 2023-10-05 DIAGNOSIS — Z79899 Other long term (current) drug therapy: Secondary | ICD-10-CM

## 2023-10-05 DIAGNOSIS — I480 Paroxysmal atrial fibrillation: Secondary | ICD-10-CM

## 2023-10-05 MED ORDER — NITROGLYCERIN 0.4 MG SL SUBL: 0.4000 mg | SUBLINGUAL_TABLET | SUBLINGUAL | 3 refills | Status: AC | PRN

## 2023-10-05 NOTE — Patient Instructions (Signed)
 Medication Instructions:  Your physician recommends that you continue on your current medications as directed. Please refer to the Current Medication list given to you today.    *If you need a refill on your cardiac medications before your next appointment, please call your pharmacy*  Lab Work: Your provider would like for you to have the following labs drawn at your primary care office: CBC, Mg.    Testing/Procedures: No test ordered today   Follow-Up: At [facility], you and your health needs are our priority.  As part of our continuing mission to provide you with exceptional heart care, our providers are all part of one team.  This team includes your primary Cardiologist (physician) and Advanced Practice Providers or APPs (Physician Assistants and Nurse Practitioners) who all work together to provide you with the care you need, when you need it.  Your next appointment:   6 month(s)  Provider:   Ole Holts, MD or Suzann Riddle, NP

## 2023-10-06 DIAGNOSIS — J449 Chronic obstructive pulmonary disease, unspecified: Secondary | ICD-10-CM | POA: Diagnosis not present

## 2023-10-08 DIAGNOSIS — J449 Chronic obstructive pulmonary disease, unspecified: Secondary | ICD-10-CM | POA: Diagnosis not present

## 2023-10-13 ENCOUNTER — Other Ambulatory Visit: Payer: Self-pay

## 2023-10-13 ENCOUNTER — Other Ambulatory Visit: Payer: Self-pay | Admitting: Pharmacy Technician

## 2023-10-13 NOTE — Progress Notes (Signed)
 Specialty Pharmacy Refill Coordination Note  Antonio [name] is a 78 y.o. male contacted today regarding refills of specialty medication(s) Dupilumab  (Dupixent )   Patient requested Delivery   Delivery date: 10/27/23   Verified address: [address]  [place] [address]   Medication will be filled on 10/26/23.  Injections: 9/7 & 9/21  Spoke to patient's spouse.

## 2023-10-15 ENCOUNTER — Ambulatory Visit: Payer: Self-pay | Admitting: Cardiology

## 2023-10-15 ENCOUNTER — Other Ambulatory Visit (INDEPENDENT_AMBULATORY_CARE_PROVIDER_SITE_OTHER)

## 2023-10-15 DIAGNOSIS — R7989 Other specified abnormal findings of blood chemistry: Secondary | ICD-10-CM | POA: Diagnosis not present

## 2023-10-15 DIAGNOSIS — I480 Paroxysmal atrial fibrillation: Secondary | ICD-10-CM

## 2023-10-15 DIAGNOSIS — J449 Chronic obstructive pulmonary disease, unspecified: Secondary | ICD-10-CM | POA: Diagnosis not present

## 2023-10-15 DIAGNOSIS — K649 Unspecified hemorrhoids: Secondary | ICD-10-CM

## 2023-10-15 DIAGNOSIS — Z5181 Encounter for therapeutic drug level monitoring: Secondary | ICD-10-CM

## 2023-10-15 LAB — CBC
HCT: 41.9 % (ref 39.0–52.0)
Hemoglobin: 13.8 g/dL (ref 13.0–17.0)
MCHC: 33 g/dL (ref 30.0–36.0)
MCV: 92.3 fl (ref 78.0–100.0)
Platelets: 194 K/uL (ref 150.0–400.0)
RBC: 4.54 Mil/uL (ref 4.22–5.81)
RDW: 12.8 % (ref 11.5–15.5)
WBC: 8.6 K/uL (ref 4.0–10.5)

## 2023-10-15 LAB — MAGNESIUM: Magnesium: 2.3 mg/dL (ref 1.5–2.5)

## 2023-10-15 LAB — VITAMIN D 25 HYDROXY (VIT D DEFICIENCY, FRACTURES): VITD: 76.28 ng/mL (ref 30.00–100.00)

## 2023-10-15 NOTE — Addendum Note (Signed)
 Addended by: Celes Dedic M on: 10/15/2023 11:04 AM   Modules accepted: Orders

## 2023-10-16 ENCOUNTER — Ambulatory Visit: Payer: Self-pay

## 2023-10-20 ENCOUNTER — Telehealth: Payer: Self-pay

## 2023-10-20 ENCOUNTER — Ambulatory Visit: Admitting: Internal Medicine

## 2023-10-20 ENCOUNTER — Encounter: Payer: Self-pay | Admitting: Internal Medicine

## 2023-10-20 VITALS — BP 130/64 | HR 76 | Ht 70.0 in | Wt 127.4 lb

## 2023-10-20 DIAGNOSIS — K5120 Ulcerative (chronic) proctitis without complications: Secondary | ICD-10-CM

## 2023-10-20 DIAGNOSIS — Z7901 Long term (current) use of anticoagulants: Secondary | ICD-10-CM

## 2023-10-20 DIAGNOSIS — K648 Other hemorrhoids: Secondary | ICD-10-CM

## 2023-10-20 DIAGNOSIS — Z860100 Personal history of colon polyps, unspecified: Secondary | ICD-10-CM

## 2023-10-20 MED ORDER — NA SULFATE-K SULFATE-MG SULF 17.5-3.13-1.6 GM/177ML PO SOLN: 1.0000 | Freq: Once | ORAL | 0 refills | Status: AC

## 2023-10-20 MED ORDER — HYDROCORTISONE ACETATE 25 MG RE SUPP: 25.0000 mg | Freq: Every evening | RECTAL | 0 refills | Status: DC

## 2023-10-20 NOTE — Telephone Encounter (Signed)
 Copied from CRM [id number]. Topic: Clinical - Order For Equipment >> Oct 20, 2023 11:17 AM Isabell A wrote: Reason for CRM: Patient received a letter from [employer] his insurance company stating they wont pay for his oxygen  concentrator but hes had one hes been using for the past 2 months - states there is no authorization or referral on file. Requesting a call back to discuss this.    Callback number: [phone]

## 2023-10-20 NOTE — Patient Instructions (Addendum)
 Continue Lialda  daily.   We have sent the following medications to your pharmacy for you to pick up at your convenience: Anusol  suppositories to use nightly for 3-5 days, then stop.  You have been scheduled for a colonoscopy. Please follow written instructions given to you at your visit today.   If you use inhalers (even only as needed), please bring them with you on the day of your procedure.  DO NOT TAKE 7 DAYS PRIOR TO TEST- Trulicity (dulaglutide) Ozempic, Wegovy (semaglutide) Mounjaro (tirzepatide) Bydureon Bcise (exanatide extended release)  DO NOT TAKE 1 DAY PRIOR TO YOUR TEST Rybelsus (semaglutide) Adlyxin (lixisenatide) Victoza (liraglutide) Byetta (exanatide) ___________________________________________________________________________  Rosine will be contacted by our office prior to your procedure for directions on holding your Xarelto .  If you do not hear from our office 1 week prior to your scheduled procedure, please call [phone] to discuss.   _______________________________________________________  If your blood pressure at your visit was 140/90 or greater, please contact your primary care physician to follow up on this.  _______________________________________________________  If you are age 80 or older, your body mass index should be between 23-30. Your Body mass index is 18.28 kg/m. If this is out of the aforementioned range listed, please consider follow up with your Primary Care Provider.  If you are age 69 or younger, your body mass index should be between 19-25. Your Body mass index is 18.28 kg/m. If this is out of the aformentioned range listed, please consider follow up with your Primary Care Provider.   ________________________________________________________  The [facility] providers would like to encourage you to use MYCHART to communicate with providers for non-urgent requests or questions.  Due to long hold times on the telephone, sending your  provider a message by [facility] may be a faster and more efficient way to get a response.  Please allow 48 business hours for a response.  Please remember that this is for non-urgent requests.  _______________________________________________________  [facility] is using a team-based approach to care.  Your team is made up of your doctor and two to three APPS. Our APPS (Nurse Practitioners and Physician Assistants) work with your physician to ensure care continuity for you. They are fully qualified to address your health concerns and develop a treatment plan. They communicate directly with your gastroenterologist to care for you. Seeing the Advanced Practice Practitioners on your physician's team can help you by facilitating care more promptly, often allowing for earlier appointments, access to diagnostic testing, procedures, and other specialty referrals.

## 2023-10-20 NOTE — Progress Notes (Signed)
 Subjective:    Patient ID: Antonio [name], male    DOB: [birth year], 78 y.o.   MRN: [id number]  HPI Antonio [name] is an 78 year old male with internal hemorrhoids and ulcerative proctitis who presents for follow-up.  He has a history of grade three internal hemorrhoids and proctitis. He underwent banding of the right posterior internal hemorrhoid on January 15, 2023, and ligation of the right anterior internal hemorrhoid with a rubber band on March 03, 2023. He experiences occasional bleeding, though not in significant amounts. He uses hydrocortisone  suppositories, typically at bedtime, and thinks they help. He takes Colace twice daily.  He has a history of adenomatous polyps, with the last colonoscopy performed in July 2020, during which six polyps were removed. The polyps were adenomatous and ulcerated, and rectal biopsies showed hyperemia without active or chronic colitis. He inquires about the need for another colonoscopy.  He is on flecainide  and Xarelto  for atrial fibrillation, with no further episodes reported. He also takes Lialda  (mesalamine ) 1.2 grams twice daily for colitis, which he feels has been effective in managing his symptoms. He has a supply that will last until March or April 2026.  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  He reports stable dyspnea with exertion only.  Not using oxygen  during the day.  No recent chest pain.  No orthopnea or PND.  He has continued Lialda  2.4 g daily. He is also using occasional Anusol  suppository at bedtime. He takes Colace twice daily.  Review of Systems As per HPI, otherwise negative  Current Medications, Allergies, Past Medical History, Past Surgical History, Family History and Social History were reviewed in [employer] record.     Objective:   Physical Exam BP 130/64 (BP Location: Left Arm, Patient Position: Sitting,  Cuff Size: Normal)   Pulse 76   Ht 5' 10 (1.778 m)   Wt 127 lb 6 oz (57.8 kg)   BMI 18.28 kg/m  Gen: awake, alert, NAD HEENT: anicteric  Abd: soft, NT/ND, +BS throughout Ext: no c/c/e Neuro: nonfocal  DIAGNOSTIC Colonoscopy: Six adenomatous and ulcerated polyps removed; mild erythematous mucosa in rectum; rectal biopsies showed hyperemia, no active or chronic colitis (08/2018)     Assessment & Plan:   Ulcerative proctitis/sigmoiditis, diagnosis 2015 Ulcerative proctitis is stable with mesalamine  (Lialda ) 1.2 grams twice daily, maintaining control and infrequent episodes of urgency. Supply sufficient until March or April 2026. - Continue mesalamine  (Lialda ) 1.2 grams, two tablets daily. - Refill Lialda  in March or April 2026.  Internal hemorrhoids, status post banding Intermittent bleeding from internal hemorrhoids, previously banded. Occasional bleeding reported, none recently. Hydrocortisone  suppositories used as needed. Hemorrhoids to be evaluated during colonoscopy, with banding considered if one column involved. - Refill hydrocortisone  suppositories for use at bedtime for 3-5 nights as needed. - Evaluate hemorrhoids during upcoming colonoscopy and consider banding if needed.  Patient requests to be banded during colonoscopy if possible.  History of adenomatous colon polyps Adenomatous colon polyps with last colonoscopy in July 2020, six polyps removed. Follow-up colonoscopy indicated due to precancerous polyps and ulcerative proctitis. Procedure at hospital due to supplemental oxygen  needs and anesthesia risks. Cardiology and pulmonology to be consulted for clearance. - Discuss colonoscopy with cardiology and pulmonology to ensure medical clearance. - Schedule colonoscopy for November 2025 at the hospital. - Send note to cardiology regarding holding Xarelto  for the procedure.  Chronic anticoagulation Will hold Xarelto   2 days prior to endoscopic procedures - will instruct when  and how to resume after procedure. Benefits and risks of procedure explained including risks of bleeding, perforation, infection, missed lesions, reactions to medications and possible need for hospitalization and surgery for complications. Additional rare but real risk of stroke or other vascular clotting events off Xarelto  also explained and need to seek urgent help if any signs of these problems occur. Will communicate by phone or EMR with patient's  prescribing provider to confirm that holding Xarelto  is reasonable in this case.  30 minutes total spent today including patient facing time, coordination of care, reviewing medical history/procedures/pertinent radiology studies, and documentation of the encounter.

## 2023-10-20 NOTE — Telephone Encounter (Signed)
 [facility] Pre-operative Risk Assessment     Request for surgical clearance:     Endoscopy Procedure  What type of surgery is being performed?     colonoscopy  When is this surgery scheduled?     12/24/23  What type of clearance is required ?   Pharmacy  Are there any medications that need to be held prior to surgery and how long? Xarelto  x 2 days  Practice name and name of physician performing surgery?      [facility]  What is your office phone and fax number?      Phone- [phone]  Fax- [phone]  Anesthesia type (None, local, MAC, general) ?       MAC   Please route your response to Alan Fusi, CMA

## 2023-10-21 NOTE — Telephone Encounter (Signed)
 I received a message from [place] with [employer] [place], Glade Erskin Cough, we are aware of the denial, my billing team is working with the insurance to get this corrected.  I called the patient and had to leave a message

## 2023-10-21 NOTE — Telephone Encounter (Signed)
 I have sent urgent message to [facility] about this issue

## 2023-10-22 DIAGNOSIS — J449 Chronic obstructive pulmonary disease, unspecified: Secondary | ICD-10-CM | POA: Diagnosis not present

## 2023-10-23 NOTE — Telephone Encounter (Signed)
 Noted. Nothing further needed.

## 2023-10-26 ENCOUNTER — Other Ambulatory Visit: Payer: Self-pay

## 2023-10-26 ENCOUNTER — Telehealth: Payer: Self-pay

## 2023-10-26 NOTE — Progress Notes (Signed)
 Will initiate PA in new encounter

## 2023-10-26 NOTE — Telephone Encounter (Signed)
 Received notification from [employer] regarding a prior authorization for DUPIXENT . Authorization has been APPROVED from 10/26/2023 to 10/25/2024. Approval letter sent to scan center.  Authorization # [id number]  Sherry Pennant, PharmD, MPH, BCPS, CPP Clinical Pharmacist [facility])

## 2023-10-26 NOTE — Progress Notes (Signed)
 PA approved. Please reprocess

## 2023-10-26 NOTE — Telephone Encounter (Addendum)
 Submitted an urgent Prior Authorization request to [facility] [employer] for DUPIXENT  via CoverMyMeds. Will update once we receive a response.  Key: [facility]

## 2023-10-27 ENCOUNTER — Ambulatory Visit: Admitting: Pulmonary Disease

## 2023-10-27 NOTE — Telephone Encounter (Signed)
 Called and spoke with patient and informed him per Cardiology to hold Xarelto  2 days prior to his procedure. Patient verbalized understanding.

## 2023-10-27 NOTE — Telephone Encounter (Signed)
   Patient Name: Antonio [name]  DOB: 1945-02-18 MRN: [id number]  Primary Cardiologist: Lonni Hanson, MD  Clinical pharmacists have reviewed the patient's past medical history, labs, and current medications as part of preoperative protocol coverage. The following recommendations have been made:  Patient with diagnosis of atrial fibrillation on Xarelto  for anticoagulation.     What type of surgery is being performed?     colonoscopy  When is this surgery scheduled?     12/24/23   CHA2DS2-VASc Score = 4   This indicates a 4.8% annual risk of stroke. The patient's score is based upon: CHF History: 0 HTN History: 1 Diabetes History: 0 Stroke History: 0 Vascular Disease History: 1 Age Score: 2 Gender Score: 0   CrCl 56 Platelet count 194   Patient has not had an Afib/aflutter ablation or Watchman within the last 3 months or DCCV within the last 30 days    Per office protocol, patient can hold Xarelto  for 2 days prior to procedure.   Patient will not need bridging with Lovenox (enoxaparin) around procedure. Please resume Xarelto  as soon as possible postprocedure, at the discretion of the surgeon.   I will route this recommendation to the requesting party via Epic fax function and remove from pre-op pool.  Please call with questions.  Telicia Hodgkiss E Tylar Merendino, NP 10/27/2023, 2:59 PM

## 2023-10-27 NOTE — Telephone Encounter (Signed)
 Patient with diagnosis of atrial fibrillation on Xarelto  for anticoagulation.    What type of surgery is being performed?     colonoscopy  When is this surgery scheduled?     12/24/23    CHA2DS2-VASc Score = 4   This indicates a 4.8% annual risk of stroke. The patient's score is based upon: CHF History: 0 HTN History: 1 Diabetes History: 0 Stroke History: 0 Vascular Disease History: 1 Age Score: 2 Gender Score: 0    CrCl 56 Platelet count 194  Patient has not had an Afib/aflutter ablation or Watchman within the last 3 months or DCCV within the last 30 days   Per office protocol, patient can hold Xarelto  for 2 days prior to procedure.   Patient will not need bridging with Lovenox (enoxaparin) around procedure.  **This guidance is not considered finalized until pre-operative APP has relayed final recommendations.**

## 2023-10-28 ENCOUNTER — Encounter: Payer: Self-pay | Admitting: Pulmonary Disease

## 2023-10-28 ENCOUNTER — Ambulatory Visit: Admitting: Pulmonary Disease

## 2023-10-28 VITALS — BP 126/86 | HR 78 | Temp 98.0°F | Ht 70.0 in | Wt 129.6 lb

## 2023-10-28 DIAGNOSIS — J300 Vasomotor rhinitis: Secondary | ICD-10-CM | POA: Diagnosis not present

## 2023-10-28 DIAGNOSIS — G4736 Sleep related hypoventilation in conditions classified elsewhere: Secondary | ICD-10-CM

## 2023-10-28 DIAGNOSIS — J449 Chronic obstructive pulmonary disease, unspecified: Secondary | ICD-10-CM | POA: Diagnosis not present

## 2023-10-28 DIAGNOSIS — I480 Paroxysmal atrial fibrillation: Secondary | ICD-10-CM

## 2023-10-28 DIAGNOSIS — J4550 Severe persistent asthma, uncomplicated: Secondary | ICD-10-CM | POA: Diagnosis not present

## 2023-10-28 DIAGNOSIS — Z23 Encounter for immunization: Secondary | ICD-10-CM

## 2023-10-28 DIAGNOSIS — J439 Emphysema, unspecified: Secondary | ICD-10-CM | POA: Diagnosis not present

## 2023-10-28 MED ORDER — IPRATROPIUM BROMIDE 0.03 % NA SOLN: 2.0000 | Freq: Three times a day (TID) | NASAL | 12 refills | Status: AC

## 2023-10-28 NOTE — Progress Notes (Addendum)
 Subjective:    Patient ID: Antonio [name], male    DOB: Jul 01, 1945, 78 y.o.   MRN: [id number]  Patient Care Team: Abbey Bruckner, MD as PCP - General (Family Medicine) End, Lonni, MD as PCP - Cardiology (Cardiology) Cindie Ole DASEN, MD as PCP - Electrophysiology (Clinical Cardiac Electrophysiology) Pyrtle, Gordy HERO, MD as Consulting Physician (Gastroenterology) Penne Knee, MD (Inactive) as Consulting Physician (Urology) Tamea Dedra CROME, MD as Consulting Physician (Pulmonary Disease) Riddle, Suzann, NP as Nurse Practitioner (Clinical Cardiac Electrophysiology) Gerard Frederick, NP as Nurse Practitioner (Cardiology)  Chief Complaint  Patient presents with   COPD    Cough. Shortness of breath on exertion.     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  He was last seen here 21 May 2023.  Continues taking Ohtuvayre , notes benefit of the medication.  He is also on Dulera  twice a day, and Dupixent  for his severe asthma.   HPI  Discussed the use of AI scribe software for clinical note transcription with the patient, who gave verbal consent to proceed.  History of Present Illness   Antonio [name] is an 78 year old male with COPD-asthma overlap who presents for follow-up.  He experiences variable shortness of breath, with some days being better than others. He uses a nebulizer with albuterol , takes Dulera , and a rescue inhaler. He notes that Ohtuvayre  is used to manage neutrophilic inflammation. He uses oxygen  at night for eight hours, which has improved his sleep quality.  He is participating in an in home pulmonary rehabilitation program [facility]), which has helped him manage his breathing better. He can now climb stairs without panting, although he experienced an episode where his heart rate increased to 130 bpm, prompting him to stop exercising and  take metoprolol .  He does have issues with paroxysmal atrial fib and takes metoprolol  when he notices it.  He experiences constant nasal drainage, this is likely due to vasomotor rhinitis. He uses Astelin  nasal spray, but it has not been effective in controlling the symptoms.  He is taking creatine supplements in his protein drinks to help with weight management, reporting a current weight of 129 pounds.  He has noted overall improvement in symptoms.  He is compliant with nocturnal oxygen  and notes benefit of the therapy.  He is also compliant with Dupixent  for his asthma and Ohtuvayre , Dulera  and albuterol  for his COPD.  He is sensitive to SAMA/LAMA due to side effect of urinary retention.    Patient to have colonoscopy coming up.  DATA 06/09/2014 pulmonary function test> ratio 51%, FEV1 1.47 L (40% predicted), total lung capacity 5.43 L (71% predicted). DLCO 16.26 (44% predicted). December 2017 lung function testing showed ratio 50%, FEV1 1.47 L 43% predicted, FVC 2.96 L 67% predicted. March 2020 pulmonary function testing ratio 38%, FEV1 0.97 L 31% predicted, total lung capacity 8.44 L 119% predicted, residual volume 198% predicted, DLCO 14.0 55% predicted. March 2020 high-resolution CT scan of the chest images : Centrilobular emphysema, no interstitial lung disease, aortic atherosclerosis. 04/20/2018-eosinophils relative 7.3, eosinophils absolute 0.6 04/20/2018-IgE-67 01/20/2019-SARS-CoV-2-detected. 03/06/2021 alpha 1 antitrypsin: Phenotype MM, level 165. 03/11/2021 echocardiogram: LVEF 55%, right ventricular function normal.  No wall motion abnormalities. 04/03/2021 PFT: FEV1 1.09 L or 36% predicted, FVC 2.60 L or 61% predicted, FEV1/FVC 42%, no bronchodilator response. Lung volumes showed moderate air trapping.  Diffusion capacity severely decreased at 49%. 05/22/2022 chest x-ray 1 view: Patchy  airspace disease in the medial right base concerning for pneumonia. 11/12/2022 chest x-ray PA and  lateral: No overt infiltrate noted, changes of severe COPD noted. 01/07/2023 chest x-ray PA and lateral: Emphysema without evidence of acute cardiopulmonary disease.    Review of Systems A 10 point review of systems was performed and it is as noted above otherwise negative.   Patient Active Problem List   Diagnosis Date Noted   Raynaud's phenomenon without gangrene 09/16/2023   Lumbar back pain 09/16/2023   Sensation of cold in finger 09/16/2023   Hyperlipidemia 03/02/2023   Vitamin D  deficiency 03/02/2023   Hemorrhoids 03/02/2023   Weight loss 03/02/2023   Pure hypercholesterolemia 05/24/2022   Primary hypertension 05/24/2022   Atrial fibrillation with rapid ventricular response (HCC) 05/22/2022   Nocturnal hypoxemia 05/22/2022   Coronary artery disease involving native coronary artery of native heart without angina pectoris 03/21/2022   DOE (dyspnea on exertion) 01/24/2021   Secondary hypercoagulable state (HCC) 12/19/2020   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   PAD ss/p SFA stent(peripheral artery disease) 11/08/2019   Paroxysmal atrial fibrillation (HCC) 09/02/2019   Stiffness of finger joint of left hand 04/06/2019   History of COVID-19 01/20/2019   Severe persistent asthma without complication 01/20/2019   Stage 3 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 (HCC) 09/20/2018   Increased eosinophils in the blood 06/21/2018   Diffusion capacity of lung (dl), decreased 98/69/7979   COPD with acute exacerbation (HCC) 06/22/2017   Atrial flutter, paroxysmal (HCC) 10/17/2016   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/2016   SVT (supraventricular tachycardia) (HCC) 10/17/2016   Ulcerative colitis (HCC) 05/28/2016   Carotid stenosis 05/02/2016   Atherosclerosis of native arteries of extremity with intermittent claudication (HCC) 05/02/2016   Bilateral carpal tunnel syndrome 12/06/2015   Centrilobular Emphysema 04/25/2014   Former smoker 04/25/2014    Social History   Tobacco Use   Smoking status: Former     Current packs/day: 0.00    Average packs/day: 0.5 packs/day for 35.0 years (17.5 ttl pk-yrs)    Types: Cigarettes, Cigars    Start date: 06/10/1976    Quit date: 06/11/2011    Years since quitting: 12.3   Smokeless tobacco: Never   Tobacco comments:    Former smoker 12/19/2020  Substance Use Topics   Alcohol  use: Yes    Alcohol /week: 7.0 standard drinks of alcohol     Types: 7 Shots of liquor per week    Comment: liquor (eg- martini, scotch every day)    Allergies  Allergen Reactions   Penicillins Rash    Has patient had a PCN reaction causing immediate rash, facial/tongue/throat swelling, SOB or lightheadedness with hypotension:Yes Has patient had a PCN reaction causing severe rash involving mucus membranes or skin necrosis:Yes Has patient had a PCN reaction that required hospitalization:No Has patient had a PCN reaction occurring within the last 10 years:No If all of the above answers are NO, then may proceed with Cephalosporin use.     Current Meds  Medication Sig   albuterol  (PROVENTIL ) (2.5 MG/3ML) 0.083% nebulizer solution Take 3 mLs (2.5 mg total) by nebulization every 6 (six) hours as needed for wheezing or shortness of breath.   albuterol  (VENTOLIN  HFA) 108 (90 Base) MCG/ACT inhaler Inhale 2 puffs into the lungs every 6 (six) hours as needed.   Creatine POWD by Does not apply route daily.   diltiazem  (CARDIZEM  CD) 120 MG 24 hr capsule TAKE 1 CAPSULE BY MOUTH EVERY DAY   dupilumab  (DUPIXENT ) 300 MG/2ML prefilled syringe Inject 300 mg into the  skin every 14 (fourteen) days.   flecainide  (TAMBOCOR ) 50 MG tablet TAKE 1 TABLET BY MOUTH TWICE A DAY   furosemide  (LASIX ) 20 MG tablet TAKE 1 TABLET BY MOUTH DAILY AS NEEDED (FOR SWELLING OR WEIGHT GAIN OF 3 LBS OVERNIGHT OR 5 LBS IN A WEEK).   hydrocortisone  (ANUSOL -HC) 25 MG suppository Place 1 suppository (25 mg total) rectally at bedtime.   ipratropium (ATROVENT ) 0.03 % nasal spray Place 2 sprays into both nostrils 3 (three) times  daily.   mesalamine  (LIALDA ) 1.2 g EC tablet TAKE 4 TABLETS BY MOUTH DAILY WITH BREAKFAST   mometasone -formoterol  (DULERA ) 200-5 MCG/ACT AERO Inhale 2 puffs into the lungs 2 (two) times daily. Rinse mouth well after use.   nitroGLYCERIN  (NITROSTAT ) 0.4 MG SL tablet Place 1 tablet (0.4 mg total) under the tongue every 5 (five) minutes as needed for chest pain.   OHTUVAYRE  3 MG/2.5ML SUSP Inhale 3 mg into the lungs in the morning and at bedtime.   rivaroxaban  (XARELTO ) 20 MG TABS tablet Take 1 tablet (20 mg total) by mouth daily with supper.   rosuvastatin  (CRESTOR ) 10 MG tablet TAKE 1 TABLET BY MOUTH DAILY   [DISCONTINUED] Azelastine  HCl 137 MCG/SPRAY SOLN Place 1 spray into both nostrils 2 (two) times daily.    Immunization History  Administered Date(s) Administered   Fluad Quad(high Dose 65+) 11/01/2020, 10/29/2021   Fluad Trivalent(High Dose 65+) 10/16/2022   INFLUENZA, HIGH DOSE SEASONAL PF 11/05/2016, 10/29/2017, 10/06/2018, 11/03/2019, 10/28/2023   Influenza Inj Mdck Quad Pf 10/28/2017   Influenza Split 11/24/2013   Influenza,inj,Quad PF,6+ Mos 11/08/2014, 11/26/2015   Influenza-Unspecified 10/11/2013, 10/11/2013   PFIZER(Purple Top)SARS-COV-2 Vaccination 05/06/2019, 05/31/2019, 01/27/2020   PNEUMOCOCCAL CONJUGATE-20 06/03/2022   Pneumococcal Conjugate-13 04/18/2014   Pneumococcal Polysaccharide-23 11/26/2015   Respiratory Syncytial Virus Vaccine,Recomb Aduvanted(Arexvy) 10/14/2022   Tdap 02/13/2011   Zoster Recombinant(Shingrix) 11/05/2016, 12/01/2016, 03/02/2017   Zoster, Live 11/07/2010        Objective:     BP 126/86   Pulse 78   Temp 98 F (36.7 C) (Temporal)   Ht 5' 10 (1.778 m)   Wt 129 lb 9.6 oz (58.8 kg)   SpO2 96%   BMI 18.60 kg/m   SpO2: 96 %  GENERAL: Thin well-developed gentleman, no acute distress.  Well-groomed, fully ambulatory without assistance.  No conversational dyspnea. HEAD: Normocephalic, atraumatic.  EYES: Pupils equal, round, reactive to  light.  No scleral icterus.  MOUTH: Dentition intact.  Oral mucosa moist.  No thrush. NECK: Supple. No thyromegaly. Trachea midline. No JVD.  No adenopathy. PULMONARY: Distant breath sounds bilaterally.  Coarse breath sounds, no wheezes or rhonchi noted. CARDIOVASCULAR: S1 and S2.  Regular rate and rhythm.  No rubs, murmurs or gallops heard. ABDOMEN: Benign. MUSCULOSKELETAL: No joint deformity, no clubbing, no edema.  Muscle tone improved.   NEUROLOGIC: No overt focal deficit, no gait disturbance, speech is fluent. SKIN: Intact,warm,dry. PSYCH: Mood and behavior normal.:   Assessment & Plan:     ICD-10-CM   1. Severe persistent asthma without complication  J45.50     2. Stage 3 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 (HCC)  J44.9     3. Nocturnal hypoxemia due to emphysema (HCC)  J43.9    G47.36     4. Paroxysmal atrial fibrillation (HCC)  I48.0     5. Vasomotor rhinitis  J30.0     6. Need for influenza vaccination  Z23 Flu vaccine HIGH DOSE PF(Fluzone Trivalent)      Orders Placed This Encounter  Procedures   Flu vaccine HIGH DOSE PF(Fluzone Trivalent)    Meds ordered this encounter  Medications   ipratropium (ATROVENT ) 0.03 % nasal spray    Sig: Place 2 sprays into both nostrils 3 (three) times daily.    Dispense:  30 mL    Refill:  12   Discussion:    Chronic obstructive pulmonary disease with severe persistent asthma overlap Severe chronic obstructive pulmonary disease with severe persistent asthma overlap, presenting with variable dyspnea. Managed with Ohtuvayre  nebulizer, albuterol , and Dulera . Pulmonary rehabilitation is ongoing, aiding in breathing techniques. Recent tachycardia episode to 130 bpm during exercise, managed with metoprolol . Nocturnal oxygen  therapy with good adherence and improved sleep quality. Creatine supplementation added to protein drinks for weight maintenance. - Continue Ohtuvayre  nebulizer, albuterol , and Dulera  as prescribed - Continue  pulmonary rehabilitation - Use oxygen  therapy at night for 8 hours - Monitor heart rate during exercise and use metoprolol  as needed (known paroxysmal A-fib) - Administer flu shot today - From the pulmonary standpoint he is well compensated for his upcoming colonoscopy.  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 Current treatment with Astelin  nasal spray is inadequate. - Prescribe Atrovent  nasal spray     Follow-up will be in 3 months time he is to contact us  prior to that time should any new difficulties arise.   Advised if symptoms do not improve or worsen, to please contact office for sooner follow up or seek emergency care.    I spent 30 minutes of dedicated to the care of this patient on the date of this encounter to include pre-visit review of records, face-to-face time with the patient discussing conditions above, post visit ordering of testing, clinical documentation with the electronic health record, making appropriate referrals as documented, and communicating necessary findings to members of the patients care team.     C. Leita Sanders, MD Advanced Bronchoscopy PCCM [facility]-    *This note was generated using voice recognition software/Dragon and/or AI transcription program.  Despite best efforts to proofread, errors can occur which can change the meaning. Any transcriptional errors that result from this process are unintentional and may not be fully corrected at the time of dictation.

## 2023-10-28 NOTE — Patient Instructions (Signed)
 VISIT SUMMARY:  Today, you came in for a follow-up visit to discuss your chronic obstructive pulmonary disease (COPD) with asthma overlap and your ongoing symptoms. We also reviewed your nasal drainage issues and your current treatments.  YOUR PLAN:  -CHRONIC OBSTRUCTIVE PULMONARY DISEASE WITH SEVERE PERSISTENT ASTHMA OVERLAP: This condition involves chronic inflammation and narrowing of the airways, making it difficult to breathe. You should continue using your Otuvayre nebulizer, albuterol , and Dulera  as prescribed. Keep participating in pulmonary rehabilitation to improve your breathing techniques. Use oxygen  therapy at night for 8 hours to help with sleep quality. Monitor your heart rate during exercise and take metoprolol  if needed. You received a flu shot today to help prevent respiratory infections.  -VASOMOTOR RHINITIS: This condition causes chronic nasal drainage due to non-allergic triggers. Since Astelin  nasal spray has not been effective, we are prescribing Atrovent  nasal spray to help manage your symptoms.  INSTRUCTIONS:  Please continue with your current medications and treatments as discussed. Monitor your heart rate during exercise and use metoprolol  if needed. Use the new Atrovent  nasal spray for your nasal drainage. Follow up with us  if you experience any new or worsening symptoms.

## 2023-10-29 ENCOUNTER — Ambulatory Visit

## 2023-10-29 ENCOUNTER — Ambulatory Visit: Attending: Internal Medicine | Admitting: Internal Medicine

## 2023-10-29 ENCOUNTER — Encounter: Payer: Self-pay | Admitting: Internal Medicine

## 2023-10-29 ENCOUNTER — Other Ambulatory Visit: Payer: Self-pay

## 2023-10-29 ENCOUNTER — Telehealth: Payer: Self-pay | Admitting: Pulmonary Disease

## 2023-10-29 VITALS — BP 120/60 | HR 60 | Ht 71.0 in | Wt 128.2 lb

## 2023-10-29 DIAGNOSIS — I2510 Atherosclerotic heart disease of native coronary artery without angina pectoris: Secondary | ICD-10-CM | POA: Diagnosis not present

## 2023-10-29 DIAGNOSIS — I480 Paroxysmal atrial fibrillation: Secondary | ICD-10-CM

## 2023-10-29 DIAGNOSIS — J449 Chronic obstructive pulmonary disease, unspecified: Secondary | ICD-10-CM | POA: Diagnosis not present

## 2023-10-29 NOTE — Patient Instructions (Signed)
 Medication Instructions:  Your physician recommends that you continue on your current medications as directed. Please refer to the Current Medication list given to you today.    *If you need a refill on your cardiac medications before your next appointment, please call your pharmacy*  Lab Work: No labs ordered today    Testing/Procedures: ZIO XT- Long Term Monitor Instructions  Your physician has requested you wear a ZIO patch monitor for 14 days.  This is a single patch monitor. [employer] supplies one patch monitor per enrollment. Additional stickers are not available. Please do not apply patch if you will be having a Nuclear Stress Test, Echocardiogram, Cardiac CT, MRI, or Chest Xray during the period you would be wearing the monitor. The patch cannot be worn during these tests. You cannot remove and re-apply the ZIO XT patch monitor.  Your ZIO patch monitor will be mailed 3 day USPS to your address on file. It may take 3-5 days to receive your monitor after you have been enrolled. Once you have received your monitor, please review the enclosed instructions. Your monitor has already been registered assigning a specific monitor serial number to you.  Billing and Patient Assistance Program Information  We have supplied [employer] with any of your insurance information on file for billing purposes.  [employer] offers a sliding scale Patient Assistance Program for patients that do not have insurance, or whose insurance does not completely cover the cost of the ZIO monitor.  You must apply for the Patient Assistance Program to qualify for this discounted rate.  To apply, please call [employer] at [phone], select option 4, select option 2, ask to apply for Patient Assistance Program. Meredeth will ask your household income, and how many people are in your household. They will quote your out-of-pocket cost based on that information. [employer] will also be able to set up a 11-month, interest-free payment plan if  needed.  Applying the monitor   Shave hair from upper left chest.  Hold abrader disc by orange tab. Rub abrader in 40 strokes over the upper left chest as indicated in your monitor instructions.  Clean area with 4 enclosed alcohol  pads. Let dry.  Apply patch as indicated in monitor instructions. Patch will be placed under collarbone on left side of chest with arrow pointing upward.  Rub patch adhesive wings for 2 minutes. Remove white label marked 1. Remove the white label marked 2. Rub patch adhesive wings for 2 additional minutes.  While looking in a mirror, press and release button in center of patch. A small green light will flash 3-4 times. This will be your only indicator that the monitor has been turned on.  Do not shower for the first 24 hours. You may shower after the first 24 hours.  Press the button if you feel a symptom. You will hear a small click. Record Date, Time and Symptom in the Patient Logbook.  When you are ready to remove the patch, follow instructions on the last 2 pages of Patient Logbook.  Stick patch monitor into the tabs at the bottom of the return box.  Place Patient Logbook in the blue and white box. Use locking tab on box and tape box closed securely. The blue and white box has prepaid postage on it. Please place it in the mailbox as soon as possible. Your physician should have your test results approximately 7-14 days after the monitor has been mailed back to [facility].  Call [facility] Customer Care at [phone] if you  have questions regarding your ZIO XT patch monitor.  Call them immediately if you see an orange light blinking on your monitor.  If your monitor falls off in less than 4 days, contact our Monitor department at [phone].  If your monitor becomes loose or falls off after 4 days call [employer] at [phone] for suggestions on securing your monitor.   Follow-Up: At [facility], you and your health needs are our priority.   As part of our continuing mission to provide you with exceptional heart care, our providers are all part of one team.  This team includes your primary Cardiologist (physician) and Advanced Practice Providers or APPs (Physician Assistants and Nurse Practitioners) who all work together to provide you with the care you need, when you need it.  Your next appointment:   1 year(s)  Provider:   You may see Lonni Hanson, MD or one of the following Advanced Practice Providers on your designated Care Team:   Lonni Meager, NP Lesley Maffucci, PA-C Bernardino Bring, PA-C Cadence Frankford, PA-C Tylene Lunch, NP Barnie Hila, NP

## 2023-10-29 NOTE — Progress Notes (Signed)
 Cardiology Office Note:  .   Date:  10/29/2023  ID:  Antoinne [name], DOB Nov 04, 1945, MRN [id number] PCP: Abbey Bruckner, MD  [facility] Providers Cardiologist:  Lonni Hanson, MD Cardiology APP:  Gerard Frederick, NP  Electrophysiologist:  OLE ONEIDA HOLTS, MD  Electrophysiology APP:  Riddle, Suzann, NP     History of Present Illness: .   Antonio [name] is a 78 y.o. male with history of paroxysmal atrial fibrillation/flutter, PSVT, nonobstructive coronary artery disease, PAD status post left SFA stenting, hypertension, hyperlipidemia, COPD, ulcerative colitis, and BPH, who presents for follow-up of atrial fibrillation/flutter, CAD, and PAD.  He was last seen in our office in June by Frederick Gerard, NP, at which time he was concerned about weight loss and decreased appetite.  He denied chest pain or worsening shortness of breath.  He subsequently saw Elvie Needle, NP, in the EP clinic, which time there was no evidence of recurrent atrial fibrillation prompting continuation of flecainide  and diltiazem .  Today, [name] believes that he had an episode of atrial fibrillation 2 days ago.  He was participating in his virtual pulmonary rehab and noticed that his heart rate jumped up to 130 bpm at rest on his pulseoximeter.  He felt fine at the time without palpitations, chest pain, and shortness of breath.  He took a dose of prn metoprolol  that he was previously prescribed and noticed gradual slowing of his heart rate.  He has not had any further tachycardia since then..  He notes an episode of fatigue and generalized malaise ~2 weeks ago.  He did not check his heart rate at that time, though he did not experience palpitations or chest pain.  His chronic exertional dyspnea that he attributes to his COPD has been stable.  He has been compliant with flecainide , diltiazem , and rivaroxaban .  Antonio [name] notes intermittent rectal bleeding that he attributes to hemorrhoids.  He has  undergone banding and excision of hemorrhoids in the past.  He is scheduled for a colonoscopy for further evaluation in November.  ROS: See HPI  Studies Reviewed: SABRA   EKG Interpretation Date/Time:  Thursday October 29 2023 08:57:03 EDT Ventricular Rate:  60 PR Interval:  184 QRS Duration:  76 QT Interval:  400 QTC Calculation: 400 R Axis:   87  Text Interpretation: Sinus rhythm with Premature atrial complexes Borderline ECG When compared with ECG of 05-Oct-2023 10:31, No significant change was found Confirmed by Seren Chaloux, Lonni [phone]) on 10/29/2023 4:20:05 PM    Risk Assessment/Calculations:    CHA2DS2-VASc Score = 4   This indicates a 4.8% annual risk of stroke. The patient's score is based upon: CHF History: 0 HTN History: 1 Diabetes History: 0 Stroke History: 0 Vascular Disease History: 1 Age Score: 2 Gender Score: 0            Physical Exam:   VS:  BP 120/60 (BP Location: Left Arm, Patient Position: Sitting, Cuff Size: Normal)   Pulse 60   Ht 5' 11 (1.803 m)   Wt 128 lb 4 oz (58.2 kg)   BMI 17.89 kg/m    Wt Readings from Last 3 Encounters:  10/29/23 128 lb 4 oz (58.2 kg)  10/28/23 129 lb 9.6 oz (58.8 kg)  10/20/23 127 lb 6 oz (57.8 kg)    General:  NAD. Neck: No JVD or HJR. Lungs: Diminished breath sounds throughout without wheezes or crackles. Heart: Regular rate and rhythm without murmurs, rubs, or gallops. Abdomen: Soft, nontender, nondistended. Extremities: No  lower extremity edema.  ASSESSMENT AND PLAN: .    Paroxysmal atrial fibrillation: Antonio [name] is concerned that he may have had an episode of a-fib earlier this week based on elevated HR's at rest.  I am also concerned that his malaise/fatigue a couple of weeks ago could have been due to atrial fibrillation.  His EKG today demonstrates sinus rhythm with PAC's.  He has been compliant with flecainide  and diltiazem , as well as rivaroxaban .  We have agreed to continue these medications and obtain a  14 day event monitor to assess his atrial fibrillation burden and rate control.  Based on results, he may need additional workup by our EP team.  I also expressed my concern about his intermittent rectal bleeding and long-term anticoagulation.  CBC on 9/4 was normal.  We discussed role for left atrial appendage occlusion; he is not interested in learning more about this at the current time.  Coronary artery disease: No angina reported with coronary CTA in 02/2022 demonstrating mild-moderate, non-obstructive CAD.  Continue rivaroxaban  in lieu of aspirin and secondary prevention with rosuvastatin  (LDL well-controlled on last check in 02/2023).  PAD: No claudication reported.  Continue secondary prevention with rivaroxaban  and rosuvastatin .  COPD: Stable exertional dyspnea.  Ongoing management per Dr. Tamea.  Rectal bleeding and preop evaluation: Antonio [name] notes long history of intermittent hemorrhoidal bleeding.  Recent CBC did not show any anemia.  As he does not have any unstable cardiac symptoms, I think it is reasonable for him to proceed with colonoscopy as planned without additional cardiac testing/intervention.  Rivaroxaban  can be held for 2 days before the procedure and resumed as soon as it is felt safe to do so by the endoscopist.  I recommend perioperative risk stratification with pulmonology, given Mr. Tandy's severe COPD.    Dispo: Return to clinic in 1 year.  Signed, Lonni Hanson, MD

## 2023-10-29 NOTE — Telephone Encounter (Signed)
 Pt is scheduled for a Colonoscopy in November. Wanted us  to know to be on look out for risk assessment

## 2023-10-29 NOTE — Telephone Encounter (Signed)
 Noted

## 2023-11-02 NOTE — Telephone Encounter (Signed)
 Clearance has been faxed and I have notified the patient.   Nothing further needed.

## 2023-11-02 NOTE — Telephone Encounter (Addendum)
 Patient last seen on 10/28/23. Can you addend your note to add the clearance?

## 2023-11-03 DIAGNOSIS — J449 Chronic obstructive pulmonary disease, unspecified: Secondary | ICD-10-CM | POA: Diagnosis not present

## 2023-11-05 DIAGNOSIS — J449 Chronic obstructive pulmonary disease, unspecified: Secondary | ICD-10-CM | POA: Diagnosis not present

## 2023-11-10 DIAGNOSIS — J449 Chronic obstructive pulmonary disease, unspecified: Secondary | ICD-10-CM | POA: Diagnosis not present

## 2023-11-17 ENCOUNTER — Encounter: Payer: Self-pay | Admitting: Pulmonary Disease

## 2023-11-17 DIAGNOSIS — M79675 Pain in left toe(s): Secondary | ICD-10-CM | POA: Diagnosis not present

## 2023-11-18 ENCOUNTER — Telehealth: Payer: Self-pay | Admitting: Internal Medicine

## 2023-11-18 NOTE — Telephone Encounter (Signed)
 Inbound call from patient stating he will like to reschedule that upcoming procedure he has at [facility] on 12/24/23  Requesting a call back  Please advise  Thank you

## 2023-11-18 NOTE — Telephone Encounter (Signed)
 Patient wishes to cancel hospital colonoscopy. 12/24/23.   Offered 02/01/24 and he declined. Wants to wait until early next year.  Advised schedule is not available that far out.  Put in 3 month recall.  If patient continues to have hemorrhoid bleeding, will call for a banding appointment. Spoke with patient's wife, Dorthea.

## 2023-11-18 NOTE — Telephone Encounter (Signed)
 [employer] makes good portable oxygen  concentrator's however, he would have to qualify during a walk with oxygen  desaturations for Medicare to pay for it.  We have tested him previously and he has not shown oxygen  desaturations.  However we can set him up for a 6-minute walk and see if he can qualify then.

## 2023-11-18 NOTE — Telephone Encounter (Signed)
 Patient placed on hospital list to be contacted next year.

## 2023-11-18 NOTE — Telephone Encounter (Signed)
 Amanda, FYI  We should add his name to the possible hospital for seizure list for early 2026  Thank you  JMP

## 2023-11-24 DIAGNOSIS — I480 Paroxysmal atrial fibrillation: Secondary | ICD-10-CM | POA: Diagnosis not present

## 2023-11-27 DIAGNOSIS — J4550 Severe persistent asthma, uncomplicated: Secondary | ICD-10-CM | POA: Diagnosis not present

## 2023-11-27 DIAGNOSIS — J439 Emphysema, unspecified: Secondary | ICD-10-CM | POA: Diagnosis not present

## 2023-12-01 DIAGNOSIS — I480 Paroxysmal atrial fibrillation: Secondary | ICD-10-CM

## 2023-12-03 ENCOUNTER — Ambulatory Visit: Payer: Self-pay | Admitting: Internal Medicine

## 2023-12-04 MED ORDER — METHYLPREDNISOLONE 4 MG PO TBPK: ORAL_TABLET | ORAL | 0 refills | Status: DC

## 2023-12-04 MED ORDER — DOXYCYCLINE HYCLATE 100 MG PO TABS: 100.0000 mg | ORAL_TABLET | Freq: Two times a day (BID) | ORAL | 0 refills | Status: AC

## 2023-12-04 NOTE — Addendum Note (Signed)
 Addended by: TAMEA DEDRA CROME on: 12/04/2023 08:52 AM   Modules accepted: Orders

## 2023-12-04 NOTE — Telephone Encounter (Signed)
 I have sent prescriptions for a Medrol  Dosepak and doxycycline  to his pharmacy.

## 2023-12-08 ENCOUNTER — Encounter

## 2023-12-17 ENCOUNTER — Telehealth: Payer: Self-pay | Admitting: Internal Medicine

## 2023-12-17 MED ORDER — HYDROCORTISONE ACETATE 25 MG RE SUPP: 25.0000 mg | Freq: Every evening | RECTAL | 0 refills | Status: AC

## 2023-12-17 NOTE — Telephone Encounter (Signed)
 Patient has been scheduled for 1/21 at 11:00 for a hem band, patient is requesting refill for suppositories. Please advise.

## 2023-12-17 NOTE — Telephone Encounter (Signed)
 Prescription sent to patient's pharmacy.

## 2023-12-23 ENCOUNTER — Other Ambulatory Visit: Payer: Self-pay | Admitting: Pulmonary Disease

## 2023-12-24 ENCOUNTER — Ambulatory Visit (HOSPITAL_COMMUNITY): Admit: 2023-12-24 | Admitting: Internal Medicine

## 2023-12-24 ENCOUNTER — Encounter (HOSPITAL_COMMUNITY): Payer: Self-pay

## 2023-12-24 SURGERY — COLONOSCOPY
Anesthesia: Monitor Anesthesia Care

## 2023-12-25 ENCOUNTER — Other Ambulatory Visit (HOSPITAL_COMMUNITY): Payer: Self-pay

## 2023-12-25 ENCOUNTER — Telehealth: Payer: Self-pay

## 2023-12-25 MED ORDER — FLUTICASONE FUROATE-VILANTEROL 200-25 MCG/ACT IN AEPB: 1.0000 | INHALATION_SPRAY | Freq: Every day | RESPIRATORY_TRACT | 11 refills | Status: DC

## 2023-12-25 NOTE — Telephone Encounter (Signed)
 Antonio [name] has been sent in and I have notified the patient.   Nothing further needed.

## 2023-12-25 NOTE — Telephone Encounter (Signed)
*  Pulm  Pharmacy Patient Advocate Encounter   Received notification from Fax that prior authorization for Dulera  200-5MCG/ACT aerosol   is required/requested.   Insurance verification completed.   The patient is insured through [facility] ADVANTAGE/RX ADVANCE.   Per test claim:  Brand Ranell Beck is preferred by the insurance.  If suggested medication is appropriate, Please send in a new RX and discontinue this one. If not, please advise as to why it's not appropriate so that we may request a Prior Authorization. Please note, some preferred medications may still require a PA.  If the suggested medications have not been trialed and there are no contraindications to their use, the PA will not be submitted, as it will not be approved.   Brand Breo Ellipta- $0.00

## 2023-12-25 NOTE — Telephone Encounter (Signed)
 We can send prescription for Breo Ellipta 200, 1 inhalation daily, rinse mouth well after use.  Let patient know of the change in therapy.

## 2023-12-28 DIAGNOSIS — J449 Chronic obstructive pulmonary disease, unspecified: Secondary | ICD-10-CM | POA: Diagnosis not present

## 2023-12-28 DIAGNOSIS — J4550 Severe persistent asthma, uncomplicated: Secondary | ICD-10-CM | POA: Diagnosis not present

## 2023-12-28 DIAGNOSIS — J439 Emphysema, unspecified: Secondary | ICD-10-CM | POA: Diagnosis not present

## 2023-12-31 ENCOUNTER — Telehealth: Payer: Self-pay

## 2023-12-31 MED ORDER — FLUTICASONE FUROATE-VILANTEROL 200-25 MCG/ACT IN AEPB: 1.0000 | INHALATION_SPRAY | Freq: Every day | RESPIRATORY_TRACT | 3 refills | Status: AC

## 2023-12-31 NOTE — Telephone Encounter (Signed)
 Copied from CRM [id number]. Topic: Clinical - Prescription Issue >> Dec 31, 2023  2:06 PM Russell PARAS wrote: Reason for CRM:   Brittany, with [facility] is requesting order for [facility] be sent to their pharmacy.   FX#  [phone]  CB#  [phone]

## 2024-01-05 ENCOUNTER — Telehealth: Payer: Self-pay | Admitting: Pulmonary Disease

## 2024-01-05 DIAGNOSIS — G4734 Idiopathic sleep related nonobstructive alveolar hypoventilation: Secondary | ICD-10-CM

## 2024-01-05 DIAGNOSIS — J4489 Other specified chronic obstructive pulmonary disease: Secondary | ICD-10-CM

## 2024-01-05 DIAGNOSIS — J449 Chronic obstructive pulmonary disease, unspecified: Secondary | ICD-10-CM

## 2024-01-05 DIAGNOSIS — J4550 Severe persistent asthma, uncomplicated: Secondary | ICD-10-CM

## 2024-01-05 DIAGNOSIS — G4736 Sleep related hypoventilation in conditions classified elsewhere: Secondary | ICD-10-CM

## 2024-01-05 DIAGNOSIS — R0602 Shortness of breath: Secondary | ICD-10-CM

## 2024-01-05 MED ORDER — PARI VIOS PRO LC PLUS SYSTEM MISC: 0 refills | Status: AC

## 2024-01-05 NOTE — Telephone Encounter (Signed)
 Pari Vios LC Pro nebulizer machine order has been placed to the [facility] on file. NFN.

## 2024-01-05 NOTE — Telephone Encounter (Signed)
 Yes okay to send the [employer] with reusable nebulizer.

## 2024-01-05 NOTE — Telephone Encounter (Signed)
 Copied from CRM [id number]. Topic: Clinical - Medication Refill >> Jan 05, 2024  2:40 PM Whitney O wrote: Medication: vios lc aerosol 310 f83-lc  Has the patient contacted their pharmacy? Yes pharmacy calling patient contacted them  (Agent: If no, request that the patient contact the pharmacy for the refill. If patient does not wish to contact the pharmacy document the reason why and proceed with request.) (Agent: If yes, when and what did the pharmacy advise?)  This is the patient's preferred pharmacy:  [facility] [place] [place] [place] [facility] [address] [place] [address] Phone: [phone] Fax: [phone]  Is this the correct pharmacy for this prescription? Yes If no, delete pharmacy and type the correct one.   Has the prescription been filled recently? No  Is the patient out of the medication?  N/A pharmacy calling   Has the patient been seen for an appointment in the last year OR does the patient have an upcoming appointment? Yes  Can we respond through MyChart? Yes  Agent: Please be advised that Rx refills may take up to 3 business days. We ask that you follow-up with your pharmacy.

## 2024-01-06 NOTE — Telephone Encounter (Signed)
 Per Dr. Tamea as long as he has the cup to use the [facility] with he does not need anything else.  I spoke with the patient he has the cup for the nebulizer to take the Ohtuvayre  with. He does not need anything else.  [facility] and [facility].  Nothing further needed.

## 2024-01-11 ENCOUNTER — Other Ambulatory Visit: Payer: Self-pay

## 2024-01-11 NOTE — Progress Notes (Signed)
 Specialty Pharmacy Ongoing Clinical Assessment Note  Antonio [name] is a 78 y.o. male who is being followed by the specialty pharmacy service for RxSp Asthma/COPD   Patient's specialty medication(s) reviewed today: Dupilumab  (Dupixent )   Missed doses in the last 4 weeks: 0   Patient/Caregiver did not have any additional questions or concerns.   Therapeutic benefit summary: Patient is achieving benefit   Adverse events/side effects summary: No adverse events/side effects   Patient's therapy is appropriate to: Continue    Goals Addressed             This Visit's Progress    Maintain optimal adherence to therapy   On track    Patient is on track. Patient will maintain adherence and be monitored by provider to determine if a change in treatment plan is warranted         Follow up: 12 months  Silvano LOISE Dolly Specialty Pharmacist

## 2024-01-11 NOTE — Progress Notes (Signed)
 Specialty Pharmacy Refill Coordination Note  Antonio [name] is a 78 y.o. male contacted today regarding refills of specialty medication(s) Dupilumab  (Dupixent )   Patient requested Delivery   Delivery date: 01/20/24   Verified address: [address]  [place] [address]   Medication will be filled on: 01/19/24

## 2024-01-13 ENCOUNTER — Ambulatory Visit: Payer: Self-pay | Admitting: Pulmonary Disease

## 2024-01-13 DIAGNOSIS — R0602 Shortness of breath: Secondary | ICD-10-CM

## 2024-01-13 NOTE — Telephone Encounter (Signed)
 FYI Only or Action Required?: Action required by provider: requesting to speak to provider in regards to symptoms. Asking for a call back on wife's cell phone.  Patient is followed in Pulmonology for COPD, last seen on 10/28/2023 by Tamea Dedra CROME, MD.  Called Nurse Triage reporting Shortness of Breath.  Symptoms began Monday.  Interventions attempted: Maintenance inhaler, Nebulizer treatments, Home oxygen  use, and Increased fluids/rest.  Symptoms are: unchanged.  Triage Disposition: See HCP Within 4 Hours (Or PCP Triage)  Patient/caregiver understands and will follow disposition?: No, wishes to speak with PCP E2C2 Pulmonary Triage - Initial Assessment Questions "Chief Complaint (e.g., cough, sob, wheezing, fever, chills, sweat or additional symptoms) *Go to specific symptom protocol after initial questions. Patient with hx of COPD calling report of increased shortness of breath-patient reports shortness of breath is mild. States developed body aches, chills and fever on Monday. Had a fever of 101.8 yesterday. Afebrile currently at 98 degrees. No chest pain, wheezing or cough. Endorses runny nose. Patient reports good use of inhalers and nebulizer. Patient reports using oxygen  at night and somewhat during the day. Discussed with patient going to urgent care vs waiting to speak to office. Patient states he would like to speak to office about his symptoms and what the provider would recommend. Patient and wife would like a call back on wife's cell phone [phone].  "How long have symptoms been present?" Early Monday morning  Have you tested for COVID or Flu? Note: If not, ask patient if a home test can be taken. If so, instruct patient to call back for positive results.  No MEDICINES:   "Have you used any OTC meds to help with symptoms?" Yes If yes, ask "What medications?" Mucinex  Cold and Flu Tylenol    "Have you used your inhalers/maintenance medication?" Yes If yes, "What  medications?" Breo inhaler Albuterol  inhaler/nebulizer  If inhaler, ask "How many puffs and how often?" Note: Review instructions on medication in the chart. Breo inhaler 1 puff Daily Albuterol  inhaler 2 puffs Q6H PRN  OXYGEN : "Do you wear supplemental oxygen ?" Yes If yes, "How many liters are you supposed to use?" 2L at night along with being on it today  "Do you monitor your oxygen  levels?" Yes If yes, What is your reading (oxygen  level) today? 94% room air  What is your usual oxygen  saturation reading?  (Note: Pulmonary O2 sats should be 90% or greater) 94-97% room air   Copied from CRM #[id number]. Topic: Clinical - Red Word Triage >> Jan 13, 2024  1:17 PM Antonio [name] wrote: Red Word that prompted transfer to Nurse Triage: Pt's wife Antonio [name] called stating the pt has chills, aches, and a fever of 101.8 and pt has COPD. Reason for Disposition  [1] MILD difficulty breathing (e.g., minimal/no SOB at rest, SOB with walking, pulse < 100) AND [2] NEW-onset or WORSE than normal  Answer Assessment - Initial Assessment Questions 6. CARDIAC HISTORY: Do you have any history of heart disease? (e.g., heart attack, angina, bypass surgery, angioplasty)      yes 7. LUNG HISTORY: Do you have any history of lung disease?  (e.g., pulmonary embolus, asthma, emphysema)     yes 12. TRAVEL: Have you traveled out of the country in the last month? (e.g., travel history, exposures)       no  Protocols used: Breathing Difficulty-A-AH

## 2024-01-13 NOTE — Telephone Encounter (Signed)
 Looks like I have 8:30 AM slot available tomorrow.  We can get a chest x-ray PA and lateral to be done prior to the visit.

## 2024-01-13 NOTE — Telephone Encounter (Signed)
 I have ordered the Chest Xray and scheduled him to see Dr. Tamea at 8:30am.  I have notified the patient and his wife.  Nothing further needed.

## 2024-01-14 ENCOUNTER — Ambulatory Visit
Admission: RE | Admit: 2024-01-14 | Discharge: 2024-01-14 | Disposition: A | Source: Ambulatory Visit | Attending: Pulmonary Disease | Admitting: Pulmonary Disease

## 2024-01-14 ENCOUNTER — Encounter: Payer: Self-pay | Admitting: Pulmonary Disease

## 2024-01-14 ENCOUNTER — Ambulatory Visit
Admission: RE | Admit: 2024-01-14 | Discharge: 2024-01-14 | Disposition: A | Attending: Pulmonary Disease | Admitting: Pulmonary Disease

## 2024-01-14 ENCOUNTER — Ambulatory Visit: Admitting: Pulmonary Disease

## 2024-01-14 VITALS — BP 136/74 | HR 78 | Temp 98.1°F | Ht 71.0 in | Wt 121.6 lb

## 2024-01-14 DIAGNOSIS — J4489 Other specified chronic obstructive pulmonary disease: Secondary | ICD-10-CM

## 2024-01-14 DIAGNOSIS — R918 Other nonspecific abnormal finding of lung field: Secondary | ICD-10-CM | POA: Diagnosis not present

## 2024-01-14 DIAGNOSIS — J189 Pneumonia, unspecified organism: Secondary | ICD-10-CM

## 2024-01-14 DIAGNOSIS — J449 Chronic obstructive pulmonary disease, unspecified: Secondary | ICD-10-CM

## 2024-01-14 DIAGNOSIS — R0602 Shortness of breath: Secondary | ICD-10-CM

## 2024-01-14 DIAGNOSIS — R059 Cough, unspecified: Secondary | ICD-10-CM | POA: Diagnosis not present

## 2024-01-14 DIAGNOSIS — R509 Fever, unspecified: Secondary | ICD-10-CM | POA: Diagnosis not present

## 2024-01-14 DIAGNOSIS — Z87891 Personal history of nicotine dependence: Secondary | ICD-10-CM

## 2024-01-14 MED ORDER — DOXYCYCLINE HYCLATE 100 MG PO TABS: 100.0000 mg | ORAL_TABLET | Freq: Two times a day (BID) | ORAL | 0 refills | Status: AC

## 2024-01-14 MED ORDER — CEFDINIR 300 MG PO CAPS: 300.0000 mg | ORAL_CAPSULE | Freq: Two times a day (BID) | ORAL | 0 refills | Status: AC

## 2024-01-14 NOTE — Progress Notes (Signed)
 Subjective:    Patient ID: Antonio [name], male    DOB: [birth year], 78 y.o.   MRN: [id number]  Patient Care Team: Abbey Bruckner, MD as PCP - General (Family Medicine) End, Lonni, MD as PCP - Cardiology (Cardiology) Cindie Ole DASEN, MD as PCP - Electrophysiology (Clinical Cardiac Electrophysiology) Pyrtle, Gordy HERO, MD as Consulting Physician (Gastroenterology) Penne Knee, MD (Inactive) as Consulting Physician (Urology) Tamea Dedra CROME, MD as Consulting Physician (Pulmonary Disease) Riddle, Suzann, NP as Nurse Practitioner (Clinical Cardiac Electrophysiology) Gerard Frederick, NP as Nurse Practitioner (Cardiology)  Chief Complaint  Patient presents with   Shortness of Breath    Shortness of breath. Nasal drainage. No cough or wheezing.     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 He was last seen here 28 October 2023. Continues taking Ohtuvayre , notes benefit of the medication. He is also on Dulera  twice a day, and Dupixent  for his severe asthma.  He presents today as an ACUTE visit.  HPI Discussed the use of AI scribe software for clinical note transcription with the patient, who gave verbal consent to proceed.  History of Present Illness   Antonio [name] is an 78 year old male with severe COPD/asthma overlap and paroxysmal atrial fibrillation who presents with increased shortness of breath and fever.  He has increased shortness of breath and fever, with a chest x-ray revealing pneumonia localized to the right side. He is concerned about his current condition and expresses a desire to avoid hospitalization.  He has a history of atrial fibrillation and is on medications including flecainide , Xarelto , and diltiazem . He is allergic to penicillin, which causes a rash, and has previously tolerated Keflex  and Omnicef  without issues. He is unsure  about his reaction to Levaquin  as he has never taken it before.  He reports decreased appetite over the past few days but plans to eat soon. He uses a nebulizer at home with albuterol . He is concerned about his oxygen  levels, especially if they drop below ninety. Anxiety can exacerbate his symptoms, and he has a history of sleep disturbances, having only slept two hours the previous night.  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      DATA 06/09/2014 pulmonary function test> ratio 51%, FEV1 1.47 L (40% predicted), total lung capacity 5.43 L (71% predicted). DLCO 16.26 (44% predicted). December 2017 lung function testing showed ratio 50%, FEV1 1.47 L 43% predicted, FVC 2.96 L 67% predicted. March 2020 pulmonary function testing ratio 38%, FEV1 0.97 L 31% predicted, total lung capacity 8.44 L 119% predicted, residual volume 198% predicted, DLCO 14.0 55% predicted. March 2020 high-resolution CT scan of the chest images : Centrilobular emphysema, no interstitial lung disease, aortic atherosclerosis. 04/20/2018-eosinophils relative 7.3, eosinophils absolute 0.6 04/20/2018-IgE-67 01/20/2019-SARS-CoV-2-detected. 03/06/2021 alpha 1 antitrypsin: Phenotype MM, level 165. 03/11/2021 echocardiogram: LVEF 55%, right ventricular function normal.  No wall motion abnormalities. 04/03/2021 PFT: FEV1 1.09 L or 36% predicted, FVC 2.60 L or 61% predicted, FEV1/FVC 42%, no bronchodilator response. Lung volumes showed moderate air trapping.  Diffusion capacity severely decreased at 49%. 05/22/2022 chest x-ray 1 view: Patchy airspace disease in the medial right base concerning for pneumonia. 11/12/2022 chest x-ray PA and lateral: No overt infiltrate noted, changes of severe COPD noted. 01/07/2023 chest x-ray PA and lateral: Emphysema  without evidence of acute cardiopulmonary  disease. 01/14/2024 chest x-ray PA and lateral: Right middle lobe pneumonia   Review of Systems A 10 point review of systems was performed and it is as noted above otherwise negative.   Patient Active Problem List   Diagnosis Date Noted   Raynaud's phenomenon without gangrene 09/16/2023   Lumbar back pain 09/16/2023   Sensation of cold in finger 09/16/2023   Hyperlipidemia 03/02/2023   Vitamin D  deficiency 03/02/2023   Hemorrhoids 03/02/2023   Weight loss 03/02/2023   Pure hypercholesterolemia 05/24/2022   Primary hypertension 05/24/2022   Atrial fibrillation with rapid ventricular response (HCC) 05/22/2022   Nocturnal hypoxemia 05/22/2022   Coronary artery disease involving native coronary artery of native heart without angina pectoris 03/21/2022   DOE (dyspnea on exertion) 01/24/2021   Secondary hypercoagulable state 12/19/2020   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   PAD ss/p SFA stent(peripheral artery disease) 11/08/2019   Paroxysmal atrial fibrillation (HCC) 09/02/2019   Stiffness of finger joint of left hand 04/06/2019   History of COVID-19 01/20/2019   Severe persistent asthma without complication (HCC) 01/20/2019   Stage 3 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 (HCC) 09/20/2018   Increased eosinophils in the blood 06/21/2018   Diffusion capacity of lung (dl), decreased 98/69/7979   COPD with acute exacerbation (HCC) 06/22/2017   Atrial flutter, paroxysmal (HCC) 10/17/2016   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/2016   SVT (supraventricular tachycardia) 10/17/2016   Ulcerative colitis (HCC) 05/28/2016   Carotid stenosis 05/02/2016   Atherosclerosis of native arteries of extremity with intermittent claudication 05/02/2016   Bilateral carpal tunnel syndrome 12/06/2015   Centrilobular Emphysema 04/25/2014   Former smoker 04/25/2014    Social History   Tobacco Use   Smoking status: Former    Current packs/day: 0.00    Average packs/day: 0.5 packs/day for 35.0 years (17.5 ttl pk-yrs)     Types: Cigarettes, Cigars    Start date: 06/10/1976    Quit date: 06/11/2011    Years since quitting: 12.6   Smokeless tobacco: Never   Tobacco comments:    Former smoker 12/19/2020  Substance Use Topics   Alcohol  use: Yes    Alcohol /week: 7.0 standard drinks of alcohol     Types: 7 Shots of liquor per week    Comment: liquor (eg- martini, scotch every day)    Allergies  Allergen Reactions   Penicillins Rash    Has patient had a PCN reaction causing immediate rash, facial/tongue/throat swelling, SOB or lightheadedness with hypotension:Yes Has patient had a PCN reaction causing severe rash involving mucus membranes or skin necrosis:Yes Has patient had a PCN reaction that required hospitalization:No Has patient had a PCN reaction occurring within the last 10 years:No If all of the above answers are NO, then may proceed with Cephalosporin use.     Current Meds  Medication Sig   albuterol  (PROVENTIL ) (2.5 MG/3ML) 0.083% nebulizer solution Take 3 mLs (2.5 mg total) by nebulization every 6 (six) hours as needed for wheezing or shortness of breath.   albuterol  (VENTOLIN  HFA) 108 (90 Base) MCG/ACT inhaler Inhale 2 puffs into the lungs every 6 (six) hours as needed.   cefdinir  (OMNICEF ) 300 MG capsule Take 1 capsule (300 mg total) by mouth 2 (two) times daily for 7 days.   Creatine POWD by Does not apply route daily.   diltiazem  (CARDIZEM  CD) 120 MG 24 hr capsule TAKE 1 CAPSULE BY MOUTH EVERY DAY   doxycycline  (VIBRA -TABS) 100 MG tablet Take 1 tablet (100 mg total) by mouth 2 (  two) times daily for 7 days.   dupilumab  (DUPIXENT ) 300 MG/2ML prefilled syringe Inject 300 mg into the skin every 14 (fourteen) days.   flecainide  (TAMBOCOR ) 50 MG tablet TAKE 1 TABLET BY MOUTH TWICE A DAY   fluticasone  furoate-vilanterol (BREO ELLIPTA ) 200-25 MCG/ACT AEPB Inhale 1 puff into the lungs daily.   furosemide  (LASIX ) 20 MG tablet TAKE 1 TABLET BY MOUTH DAILY AS NEEDED (FOR SWELLING OR WEIGHT GAIN OF 3 LBS  OVERNIGHT OR 5 LBS IN A WEEK).   hydrocortisone  (ANUSOL -HC) 25 MG suppository Place 1 suppository (25 mg total) rectally at bedtime.   ipratropium (ATROVENT ) 0.03 % nasal spray Place 2 sprays into both nostrils 3 (three) times daily.   mesalamine  (LIALDA ) 1.2 g EC tablet TAKE 4 TABLETS BY MOUTH DAILY WITH BREAKFAST   methylPREDNISolone  (MEDROL  DOSEPAK) 4 MG TBPK tablet Take as directed on the package, this is a taper pack.   Nebulizers (PARI VIOS PRO LC PLUS SYSTEM) MISC Please use as directed.   nitroGLYCERIN  (NITROSTAT ) 0.4 MG SL tablet Place 1 tablet (0.4 mg total) under the tongue every 5 (five) minutes as needed for chest pain.   OHTUVAYRE  3 MG/2.5ML SUSP Inhale 3 mg into the lungs in the morning and at bedtime.   rivaroxaban  (XARELTO ) 20 MG TABS tablet Take 1 tablet (20 mg total) by mouth daily with supper.   rosuvastatin  (CRESTOR ) 10 MG tablet TAKE 1 TABLET BY MOUTH DAILY    Immunization History  Administered Date(s) Administered   Fluad Quad(high Dose 65+) 11/01/2020, 10/29/2021   Fluad Trivalent(High Dose 65+) 10/16/2022   INFLUENZA, HIGH DOSE SEASONAL PF 11/05/2016, 10/29/2017, 10/06/2018, 11/03/2019, 10/28/2023   Influenza Inj Mdck Quad Pf 10/28/2017   Influenza Split 11/24/2013   Influenza,inj,Quad PF,6+ Mos 11/08/2014, 11/26/2015   Influenza-Unspecified 10/11/2013, 10/11/2013   PFIZER(Purple Top)SARS-COV-2 Vaccination 05/06/2019, 05/31/2019, 01/27/2020   PNEUMOCOCCAL CONJUGATE-20 06/03/2022   Pneumococcal Conjugate-13 04/18/2014   Pneumococcal Polysaccharide-23 11/26/2015   Respiratory Syncytial Virus Vaccine,Recomb Aduvanted(Arexvy) 10/14/2022   Tdap 02/13/2011   Zoster Recombinant(Shingrix) 11/05/2016, 12/01/2016, 03/02/2017   Zoster, Live 11/07/2010        Objective:     Vitals:   01/14/24 0829  BP: 136/74  Pulse: 78  Temp: 98.1 F (36.7 C)  Height: 5' 11 (1.803 m)  Weight: 121 lb 9.6 oz (55.2 kg)  SpO2: 95% Comment: 2L pulse oxygen   TempSrc: Temporal   BMI (Calculated): 16.97     SpO2: 95 % (2L pulse oxygen )  GENERAL: Thin well-developed gentleman, no acute distress.  Well-groomed, fully ambulatory without assistance.  No conversational dyspnea. HEAD: Normocephalic, atraumatic.  EYES: Pupils equal, round, reactive to light.  No scleral icterus.  MOUTH: Dentition intact.  Oral mucosa moist.  No thrush. NECK: Supple. No thyromegaly. Trachea midline. No JVD.  No adenopathy. PULMONARY: Distant breath sounds bilaterally.  Coarse breath sounds, no wheezes or rhonchi noted.  Few crackles on right base. CARDIOVASCULAR: S1 and S2.  Regular rate and rhythm.  No rubs, murmurs or gallops heard. ABDOMEN: Benign. MUSCULOSKELETAL: No joint deformity, no clubbing, no edema.  Muscle tone improved.   NEUROLOGIC: No overt focal deficit, no gait disturbance, speech is fluent. SKIN: Intact,warm,dry. PSYCH: Mood and behavior normal.  Chest x-ray PA and lateral showing pneumonia: Assessment & Plan:     ICD-10-CM   1. Community acquired pneumonia of right lower lobe of lung  J18.9     2. Stage 3 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 (HCC)  J44.9     3. Asthma-COPD overlap syndrome (  HCC)  J44.89       Meds ordered this encounter  Medications   cefdinir  (OMNICEF ) 300 MG capsule    Sig: Take 1 capsule (300 mg total) by mouth 2 (two) times daily for 7 days.    Dispense:  14 capsule    Refill:  0   doxycycline  (VIBRA -TABS) 100 MG tablet    Sig: Take 1 tablet (100 mg total) by mouth 2 (two) times daily for 7 days.    Dispense:  14 tablet    Refill:  0   Discussion:    Right middle lobe community-acquired pneumonia Acute right lower lobe pneumonia confirmed by chest x-ray. Symptoms include increased shortness of breath and fever. No hospitalization required at this time. Previous penicillin allergy with rash. Current medications include flecainide  and Xarelto , necessitating careful selection of antibiotics to avoid cardiac interactions. Omnicef  and  doxycycline  chosen based on previous tolerance. Advised to monitor for worsening symptoms such as increased shortness of breath, fever, chills, or decreased oxygen  saturation. Instructed to avoid crowds and stay home to prevent exacerbation. - Prescribed Omnicef  and doxycycline , to be taken with food (patient has taken these before without difficulty). - Advised use of nebulizer with albuterol  2-3 times daily to aid in clearing secretions. - Instructed to monitor for worsening symptoms such as increased shortness of breath, fever, chills, or decreased oxygen  saturation. - Advised to avoid crowds and stay home to prevent exacerbation. - Scheduled follow-up appointment next week to reassess condition.      Will see the patient in follow-up in a week's time he is to contact us  prior  Advised if symptoms do not improve or worsen, to please contact office for sooner follow up or seek emergency care.    I spent 40 minutes of dedicated to the care of this patient on the date of this encounter to include pre-visit review of records, face-to-face time with the patient discussing conditions above, post visit ordering of testing, clinical documentation with the electronic health record, making appropriate referrals as documented, and communicating necessary findings to members of the patients care team.     C. Leita Sanders, MD Advanced Bronchoscopy PCCM [facility]    *This note was generated using voice recognition software/Dragon and/or AI transcription program.  Despite best efforts to proofread, errors can occur which can change the meaning. Any transcriptional errors that result from this process are unintentional and may not be fully corrected at the time of dictation.

## 2024-01-14 NOTE — Patient Instructions (Signed)
 VISIT SUMMARY:  Today, you were seen for increased shortness of breath and fever, and a chest x-ray confirmed that you have pneumonia in your right lung. We discussed your symptoms, current medications, and allergies to ensure the best treatment plan for you. You expressed a desire to avoid hospitalization, and we have created a plan to manage your condition at home.  YOUR PLAN:  -RIGHT LOWER LOBE PNEUMONIA: Pneumonia is an infection in the lungs that can cause symptoms like shortness of breath, fever, and chills. Your chest x-ray showed pneumonia in the right lower part of your lung. We have prescribed Omnicef  and doxycycline , which you should take with food. Use your nebulizer with albuterol  2-3 times daily to help clear your lungs. Monitor your symptoms closely, and avoid crowds to prevent your condition from getting worse.  INSTRUCTIONS:  Please take the prescribed antibiotics, Omnicef  and doxycycline , with food. Use your nebulizer with albuterol  2-3 times daily. Watch for any worsening symptoms such as increased shortness of breath, fever, chills, or decreased oxygen  levels. Avoid crowds and stay home to prevent your condition from getting worse. We have scheduled a follow-up appointment for next week to reassess your condition.

## 2024-01-18 ENCOUNTER — Ambulatory Visit: Payer: Self-pay | Admitting: Pulmonary Disease

## 2024-01-18 NOTE — Progress Notes (Signed)
 Noted. NFN

## 2024-01-19 ENCOUNTER — Other Ambulatory Visit: Payer: Self-pay

## 2024-01-19 ENCOUNTER — Encounter: Payer: Self-pay | Admitting: Pulmonary Disease

## 2024-01-19 ENCOUNTER — Ambulatory Visit: Admitting: Pulmonary Disease

## 2024-01-19 VITALS — BP 126/60 | HR 62 | Temp 97.9°F | Ht 71.0 in | Wt 126.8 lb

## 2024-01-19 DIAGNOSIS — J189 Pneumonia, unspecified organism: Secondary | ICD-10-CM

## 2024-01-19 DIAGNOSIS — Z87891 Personal history of nicotine dependence: Secondary | ICD-10-CM | POA: Diagnosis not present

## 2024-01-19 DIAGNOSIS — J4489 Other specified chronic obstructive pulmonary disease: Secondary | ICD-10-CM

## 2024-01-19 DIAGNOSIS — J449 Chronic obstructive pulmonary disease, unspecified: Secondary | ICD-10-CM

## 2024-01-19 NOTE — Progress Notes (Unsigned)
 Subjective:    Patient ID: Antonio [name], male    DOB: September 20, 1945, 78 y.o.   MRN: [id number]  Patient Care Team: Abbey Bruckner, MD as PCP - General (Family Medicine) End, Lonni, MD as PCP - Cardiology (Cardiology) Cindie Ole DASEN, MD as PCP - Electrophysiology (Clinical Cardiac Electrophysiology) Pyrtle, Gordy HERO, MD as Consulting Physician (Gastroenterology) Penne Knee, MD (Inactive) as Consulting Physician (Urology) Tamea Dedra CROME, MD as Consulting Physician (Pulmonary Disease) Riddle, Suzann, NP as Nurse Practitioner (Clinical Cardiac Electrophysiology) Gerard Frederick, NP as Nurse Practitioner (Cardiology)  Chief Complaint  Patient presents with   COPD    Shortness of breath on exertion and occasional at rest.     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 He was last seen here 28 October 2023. Continues taking Ohtuvayre , notes benefit of the medication. He is also on Dulera  twice a day, and Dupixent  for his severe asthma.  He was last seen on 14 January 2024 as an ACUTE visit, he was diagnosed with a right middle lobe pneumonia at that time.  HPI  DATA 06/09/2014 pulmonary function test> ratio 51%, FEV1 1.47 L (40% predicted), total lung capacity 5.43 L (71% predicted). DLCO 16.26 (44% predicted). December 2017 lung function testing showed ratio 50%, FEV1 1.47 L 43% predicted, FVC 2.96 L 67% predicted. March 2020 pulmonary function testing ratio 38%, FEV1 0.97 L 31% predicted, total lung capacity 8.44 L 119% predicted, residual volume 198% predicted, DLCO 14.0 55% predicted. March 2020 high-resolution CT scan of the chest images : Centrilobular emphysema, no interstitial lung disease, aortic atherosclerosis. 04/20/2018-eosinophils relative 7.3, eosinophils absolute 0.6 04/20/2018-IgE-67 01/20/2019-SARS-CoV-2-detected. 03/06/2021 alpha 1  antitrypsin: Phenotype MM, level 165. 03/11/2021 echocardiogram: LVEF 55%, right ventricular function normal.  No wall motion abnormalities. 04/03/2021 PFT: FEV1 1.09 L or 36% predicted, FVC 2.60 L or 61% predicted, FEV1/FVC 42%, no bronchodilator response. Lung volumes showed moderate air trapping.  Diffusion capacity severely decreased at 49%. 05/22/2022 chest x-ray 1 view: Patchy airspace disease in the medial right base concerning for pneumonia. 11/12/2022 chest x-ray PA and lateral: No overt infiltrate noted, changes of severe COPD noted. 01/07/2023 chest x-ray PA and lateral: Emphysema without evidence of acute cardiopulmonary disease. 01/14/2024 chest x-ray PA and lateral: Right middle lobe pneumonia.  Review of Systems A 10 point review of systems was performed and it is as noted above otherwise negative.   Patient Active Problem List   Diagnosis Date Noted   Raynaud's phenomenon without gangrene 09/16/2023   Lumbar back pain 09/16/2023   Sensation of cold in finger 09/16/2023   Hyperlipidemia 03/02/2023   Vitamin D  deficiency 03/02/2023   Hemorrhoids 03/02/2023   Weight loss 03/02/2023   Pure hypercholesterolemia 05/24/2022   Primary hypertension 05/24/2022   Atrial fibrillation with rapid ventricular response (HCC) 05/22/2022   Nocturnal hypoxemia 05/22/2022   Coronary artery disease involving native coronary artery of native heart without angina pectoris 03/21/2022   DOE (dyspnea on exertion) 01/24/2021   Secondary hypercoagulable state 12/19/2020   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   PAD ss/p SFA stent(peripheral artery disease) 11/08/2019   Paroxysmal atrial fibrillation (HCC) 09/02/2019   Stiffness of finger joint of left hand 04/06/2019   History of COVID-19 01/20/2019   Severe persistent asthma without complication (HCC) 01/20/2019   Stage 3 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 (HCC) 09/20/2018   Increased eosinophils in the blood 06/21/2018   Diffusion capacity of lung  (  dl), decreased 98/69/7979   COPD with acute exacerbation (HCC) 06/22/2017   Atrial flutter, paroxysmal (HCC) 10/17/2016   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/2016   SVT (supraventricular tachycardia) 10/17/2016   Ulcerative colitis (HCC) 05/28/2016   Carotid stenosis 05/02/2016   Atherosclerosis of native arteries of extremity with intermittent claudication 05/02/2016   Bilateral carpal tunnel syndrome 12/06/2015   Centrilobular Emphysema 04/25/2014   Former smoker 04/25/2014    Social History   Tobacco Use   Smoking status: Former    Current packs/day: 0.00    Average packs/day: 0.5 packs/day for 35.0 years (17.5 ttl pk-yrs)    Types: Cigarettes, Cigars    Start date: 06/10/1976    Quit date: 06/11/2011    Years since quitting: 12.6   Smokeless tobacco: Never   Tobacco comments:    Former smoker 12/19/2020  Substance Use Topics   Alcohol  use: Yes    Alcohol /week: 7.0 standard drinks of alcohol     Types: 7 Shots of liquor per week    Comment: liquor (eg- martini, scotch every day)    Allergies  Allergen Reactions   Penicillins Rash    Has patient had a PCN reaction causing immediate rash, facial/tongue/throat swelling, SOB or lightheadedness with hypotension:Yes Has patient had a PCN reaction causing severe rash involving mucus membranes or skin necrosis:Yes Has patient had a PCN reaction that required hospitalization:No Has patient had a PCN reaction occurring within the last 10 years:No If all of the above answers are NO, then may proceed with Cephalosporin use.     Current Meds  Medication Sig   albuterol  (PROVENTIL ) (2.5 MG/3ML) 0.083% nebulizer solution Take 3 mLs (2.5 mg total) by nebulization every 6 (six) hours as needed for wheezing or shortness of breath.   albuterol  (VENTOLIN  HFA) 108 (90 Base) MCG/ACT inhaler Inhale 2 puffs into the lungs every 6 (six) hours as needed.   cefdinir  (OMNICEF ) 300 MG capsule Take 1 capsule (300 mg total) by mouth 2 (two) times daily for 7 days.    Creatine POWD by Does not apply route daily.   diltiazem  (CARDIZEM  CD) 120 MG 24 hr capsule TAKE 1 CAPSULE BY MOUTH EVERY DAY   doxycycline  (VIBRA -TABS) 100 MG tablet Take 1 tablet (100 mg total) by mouth 2 (two) times daily for 7 days.   dupilumab  (DUPIXENT ) 300 MG/2ML prefilled syringe Inject 300 mg into the skin every 14 (fourteen) days.   flecainide  (TAMBOCOR ) 50 MG tablet TAKE 1 TABLET BY MOUTH TWICE A DAY   fluticasone  furoate-vilanterol (BREO ELLIPTA ) 200-25 MCG/ACT AEPB Inhale 1 puff into the lungs daily.   furosemide  (LASIX ) 20 MG tablet TAKE 1 TABLET BY MOUTH DAILY AS NEEDED (FOR SWELLING OR WEIGHT GAIN OF 3 LBS OVERNIGHT OR 5 LBS IN A WEEK).   hydrocortisone  (ANUSOL -HC) 25 MG suppository Place 1 suppository (25 mg total) rectally at bedtime.   ipratropium (ATROVENT ) 0.03 % nasal spray Place 2 sprays into both nostrils 3 (three) times daily.   mesalamine  (LIALDA ) 1.2 g EC tablet TAKE 4 TABLETS BY MOUTH DAILY WITH BREAKFAST   Nebulizers (PARI VIOS PRO LC PLUS SYSTEM) MISC Please use as directed.   nitroGLYCERIN  (NITROSTAT ) 0.4 MG SL tablet Place 1 tablet (0.4 mg total) under the tongue every 5 (five) minutes as needed for chest pain.   OHTUVAYRE  3 MG/2.5ML SUSP Inhale 3 mg into the lungs in the morning and at bedtime.   rivaroxaban  (XARELTO ) 20 MG TABS tablet Take 1 tablet (20 mg total) by mouth daily with supper.  rosuvastatin  (CRESTOR ) 10 MG tablet TAKE 1 TABLET BY MOUTH DAILY    Immunization History  Administered Date(s) Administered   Fluad Quad(high Dose 65+) 11/01/2020, 10/29/2021   Fluad Trivalent(High Dose 65+) 10/16/2022   INFLUENZA, HIGH DOSE SEASONAL PF 11/05/2016, 10/29/2017, 10/06/2018, 11/03/2019, 10/28/2023   Influenza Inj Mdck Quad Pf 10/28/2017   Influenza Split 11/24/2013   Influenza,inj,Quad PF,6+ Mos 11/08/2014, 11/26/2015   Influenza-Unspecified 10/11/2013, 10/11/2013   PFIZER(Purple Top)SARS-COV-2 Vaccination 05/06/2019, 05/31/2019, 01/27/2020    PNEUMOCOCCAL CONJUGATE-20 06/03/2022   Pneumococcal Conjugate-13 04/18/2014   Pneumococcal Polysaccharide-23 11/26/2015   Respiratory Syncytial Virus Vaccine,Recomb Aduvanted(Arexvy) 10/14/2022   Tdap 02/13/2011   Zoster Recombinant(Shingrix) 11/05/2016, 12/01/2016, 03/02/2017   Zoster, Live 11/07/2010        Objective:     Vitals:   01/19/24 1143  BP: 126/60  Pulse: 62  Temp: 97.9 F (36.6 C)  Height: 5' 11 (1.803 m)  Weight: 126 lb 12.8 oz (57.5 kg)  SpO2: 95%  TempSrc: Temporal  BMI (Calculated): 17.69     SpO2: 95 %  GENERAL: Thin well-developed gentleman, no acute distress.  Well-groomed, fully ambulatory without assistance.  No conversational dyspnea. HEAD: Normocephalic, atraumatic.  EYES: Pupils equal, round, reactive to light.  No scleral icterus.  MOUTH: Dentition intact.  Oral mucosa moist.  No thrush. NECK: Supple. No thyromegaly. Trachea midline. No JVD.  No adenopathy. PULMONARY: Distant breath sounds bilaterally.  Coarse breath sounds, no wheezes or rhonchi noted.  Few crackles on right base. CARDIOVASCULAR: S1 and S2.  Regular rate and rhythm.  No rubs, murmurs or gallops heard. ABDOMEN: Benign. MUSCULOSKELETAL: No joint deformity, no clubbing, no edema.  Muscle tone improved.   NEUROLOGIC: No overt focal deficit, no gait disturbance, speech is fluent. SKIN: Intact,warm,dry. PSYCH: Mood and behavior normal.        Assessment & Plan:   No diagnosis found.  No orders of the defined types were placed in this encounter.   No orders of the defined types were placed in this encounter.     Advised if symptoms do not improve or worsen, to please contact office for sooner follow up or seek emergency care.    I spent xxx minutes of dedicated to the care of this patient on the date of this encounter to include pre-visit review of records, face-to-face time with the patient discussing conditions above, post visit ordering of testing, clinical  documentation with the electronic health record, making appropriate referrals as documented, and communicating necessary findings to members of the patients care team.     C. Leita Sanders, MD Advanced Bronchoscopy PCCM [facility]-    *This note was generated using voice recognition software/Dragon and/or AI transcription program.  Despite best efforts to proofread, errors can occur which can change the meaning. Any transcriptional errors that result from this process are unintentional and may not be fully corrected at the time of dictation.

## 2024-01-19 NOTE — Patient Instructions (Signed)
 VISIT SUMMARY:  You came in today for a follow-up on your pneumonia diagnosis from December 4th. You are nearing the end of your antibiotic treatment and are showing signs of improvement with no fever or cough. We discussed your recovery and plans for the upcoming weeks.  YOUR PLAN:  -PNEUMONIA: Pneumonia is an infection that inflames the air sacs in one or both lungs. You were diagnosed on December 4th and have been on a 7-day course of antibiotics, with two days remaining. Your symptoms have improved, but recovery may take longer due to your age and the impact on your strength. Please complete the antibiotic course, stay in quarantine until Friday to avoid exposure to large groups, and gradually increase your activity levels. A follow-up appointment is scheduled for December 18th, 2025, and you will need a chest x-ray before this appointment to ensure the pneumonia has cleared.  INSTRUCTIONS:  Please complete your 7-day course of antibiotics and stay in quarantine until Friday. Gradually increase your activity levels as you feel stronger. Your follow-up appointment is scheduled for December 18th, 2025. Please get a chest x-ray before this appointment to ensure the pneumonia has cleared.

## 2024-01-22 ENCOUNTER — Telehealth: Payer: Self-pay

## 2024-01-22 NOTE — Telephone Encounter (Signed)
 Called patient to schedule 3 month recall colonoscopy. Offered patient Dr. Pamula hospital day on 2/3 or 2/23. Patient states he really wishes to wait until April or May of this year, when it gets warmer. Informed patient that I will let Dr. Albertus know and leave him on our hospital list to contact in a few months when April's schedule becomes available. Dr. Albertus, patient is scheduled for hemorrhoid banding with you on 03/02/24.

## 2024-01-28 ENCOUNTER — Ambulatory Visit: Admitting: Pulmonary Disease

## 2024-01-28 ENCOUNTER — Ambulatory Visit
Admission: RE | Admit: 2024-01-28 | Discharge: 2024-01-28 | Disposition: A | Source: Ambulatory Visit | Attending: Pulmonary Disease | Admitting: Pulmonary Disease

## 2024-01-28 ENCOUNTER — Encounter: Payer: Self-pay | Admitting: Pulmonary Disease

## 2024-01-28 ENCOUNTER — Ambulatory Visit
Admission: RE | Admit: 2024-01-28 | Discharge: 2024-01-28 | Disposition: A | Attending: Pulmonary Disease | Admitting: Pulmonary Disease

## 2024-01-28 VITALS — BP 136/66 | HR 60 | Temp 97.8°F | Ht 71.0 in | Wt 126.6 lb

## 2024-01-28 DIAGNOSIS — J189 Pneumonia, unspecified organism: Secondary | ICD-10-CM | POA: Diagnosis present

## 2024-01-28 DIAGNOSIS — J300 Vasomotor rhinitis: Secondary | ICD-10-CM | POA: Diagnosis not present

## 2024-01-28 DIAGNOSIS — J449 Chronic obstructive pulmonary disease, unspecified: Secondary | ICD-10-CM

## 2024-01-28 NOTE — Patient Instructions (Addendum)
 VISIT SUMMARY:  Today, we discussed your follow-up for community acquired pneumonia. You reported no current cough or sputum production, and your nasal symptoms are well-managed with your current medication. We also reviewed your COPD and vasomotor rhinitis management plans.  YOUR PLAN:  -COMMUNITY ACQUIRED PNEUMONIA: Community acquired pneumonia is a lung infection you got outside of a hospital setting. Your condition is improving, and there are no more rattling sounds in your lungs. We will order a follow-up chest x-ray in 6 weeks to ensure complete resolution.  -CHRONIC OBSTRUCTIVE PULMONARY DISEASE (COPD): COPD is a chronic lung disease that makes it hard to breathe. You are managing it well with Breo Ellipta , as needed albuterol , Ohtuvayre  and Dupixent , which helps reduce inflammation and mucus. Continue using your medication and lemon ginger crystals as needed for symptom relief.  -VASOMOTOR RHINITIS: Vasomotor rhinitis is a condition where your nose runs in response to triggers like physical activity. Your symptoms are well-managed with your current nasal spray, Atrovent  nasal. Continue with your current treatment.  INSTRUCTIONS:  Please schedule a follow-up chest x-ray in 6 weeks to ensure your pneumonia has completely resolved.

## 2024-01-28 NOTE — Progress Notes (Signed)
 Subjective:    Patient ID: Antonio [name], male    DOB: 09-15-1945, 78 y.o.   MRN: [id number]  Patient Care Team: Abbey Bruckner, MD as PCP - General (Family Medicine) End, Lonni, MD as PCP - Cardiology (Cardiology) Cindie Ole DASEN, MD as PCP - Electrophysiology (Clinical Cardiac Electrophysiology) Pyrtle, Gordy HERO, MD as Consulting Physician (Gastroenterology) Penne Knee, MD (Inactive) as Consulting Physician (Urology) Tamea Dedra CROME, MD as Consulting Physician (Pulmonary Disease) Riddle, Suzann, NP as Nurse Practitioner (Clinical Cardiac Electrophysiology) Gerard Frederick, NP as Nurse Practitioner (Cardiology)  Chief Complaint  Patient presents with   COPD    BACKGROUND/INTERVAL:Antonio [name] is a very complex 78 year old former smoker with 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 He was last seen here 28 October 2023. Continues taking Ohtuvayre , notes benefit of the medication. He is also on Dulera  twice a day, and Dupixent  for his severe asthma.  He was last seen on 19 January 2024 as a follow-up for an ACUTE visit, he was diagnosed with a right middle lobe pneumonia at that time, treated with Omnicef  and doxycycline .   HPI Discussed the use of AI scribe software for clinical note transcription with the patient, who gave verbal consent to proceed.  History of Present Illness   Antonio [name] is an 78 year old male who presents for follow-up of community acquired pneumonia.  He reports no current cough or sputum production. He experiences clear nasal discharge when going up or down stairs, but the medication prescribed for his nasal symptoms (Atrovent  nasal) has been effective in preventing his nose from running while eating.  He inquires about alternative treatments for his condition, specifically mentioning Dupixent  and Nucala, but does not report any issues with his current treatment regimen.  He has been on  Dupixent  for a number of years.  This is following the asthma/COPD indication.  He was reassured that he is on the best therapy possible.  He is on Breo elliptica, Ohtuvayre , Dupixent  and as needed albuterol  as previously mention he does not tolerate LAMA/SAMA due to urinary retention he uses lemon ginger crystals to help with mucus clearance and finds them effective.     DATA 06/09/2014 pulmonary function test> ratio 51%, FEV1 1.47 L (40% predicted), total lung capacity 5.43 L (71% predicted). DLCO 16.26 (44% predicted). December 2017 lung function testing showed ratio 50%, FEV1 1.47 L 43% predicted, FVC 2.96 L 67% predicted. March 2020 pulmonary function testing ratio 38%, FEV1 0.97 L 31% predicted, total lung capacity 8.44 L 119% predicted, residual volume 198% predicted, DLCO 14.0 55% predicted. March 2020 high-resolution CT scan of the chest images : Centrilobular emphysema, no interstitial lung disease, aortic atherosclerosis. 04/20/2018-eosinophils relative 7.3, eosinophils absolute 0.6 04/20/2018-IgE-67 01/20/2019-SARS-CoV-2-detected. 03/06/2021 alpha 1 antitrypsin: Phenotype MM, level 165. 03/11/2021 echocardiogram: LVEF 55%, right ventricular function normal.  No wall motion abnormalities. 04/03/2021 PFT: FEV1 1.09 L or 36% predicted, FVC 2.60 L or 61% predicted, FEV1/FVC 42%, no bronchodilator response. Lung volumes showed moderate air trapping.  Diffusion capacity severely decreased at 49%. 05/22/2022 chest x-ray 1 view: Patchy airspace disease in the medial right base concerning for pneumonia. 11/12/2022 chest x-ray PA and lateral: No overt infiltrate noted, changes of severe COPD noted. 01/07/2023 chest x-ray PA and lateral: Emphysema without evidence of acute cardiopulmonary disease. 01/14/2024 chest x-ray PA and lateral: Right middle lobe pneumonia.  Review of Systems A 10 point review of systems was performed and it is as noted above otherwise negative.  Patient Active Problem  List   Diagnosis Date Noted   Raynaud's phenomenon without gangrene 09/16/2023   Lumbar back pain 09/16/2023   Sensation of cold in finger 09/16/2023   Hyperlipidemia 03/02/2023   Vitamin D  deficiency 03/02/2023   Hemorrhoids 03/02/2023   Weight loss 03/02/2023   Pure hypercholesterolemia 05/24/2022   Primary hypertension 05/24/2022   Atrial fibrillation with rapid ventricular response (HCC) 05/22/2022   Nocturnal hypoxemia 05/22/2022   Coronary artery disease involving native coronary artery of native heart without angina pectoris 03/21/2022   DOE (dyspnea on exertion) 01/24/2021   Secondary hypercoagulable state 12/19/2020   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   PAD ss/p SFA stent(peripheral artery disease) 11/08/2019   Paroxysmal atrial fibrillation (HCC) 09/02/2019   Stiffness of finger joint of left hand 04/06/2019   History of COVID-19 01/20/2019   Severe persistent asthma without complication (HCC) 01/20/2019   Stage 3 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 (HCC) 09/20/2018   Increased eosinophils in the blood 06/21/2018   Diffusion capacity of lung (dl), decreased 98/69/7979   COPD with acute exacerbation (HCC) 06/22/2017   Atrial flutter, paroxysmal (HCC) 10/17/2016   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/2016   SVT (supraventricular tachycardia) 10/17/2016   Ulcerative colitis (HCC) 05/28/2016   Carotid stenosis 05/02/2016   Atherosclerosis of native arteries of extremity with intermittent claudication 05/02/2016   Bilateral carpal tunnel syndrome 12/06/2015   Centrilobular Emphysema 04/25/2014   Former smoker 04/25/2014    Social History   Tobacco Use   Smoking status: Former    Current packs/day: 0.00    Average packs/day: 0.5 packs/day for 35.0 years (17.5 ttl pk-yrs)    Types: Cigarettes, Cigars    Start date: 06/10/1976    Quit date: 06/11/2011    Years since quitting: 12.6   Smokeless tobacco: Never   Tobacco comments:    Former smoker 12/19/2020  Substance Use Topics   Alcohol   use: Yes    Alcohol /week: 7.0 standard drinks of alcohol     Types: 7 Shots of liquor per week    Comment: liquor (eg- martini, scotch every day)    Allergies[1]  Active Medications[2]  Immunization History  Administered Date(s) Administered   Fluad Quad(high Dose 65+) 11/01/2020, 10/29/2021   Fluad Trivalent(High Dose 65+) 10/16/2022   INFLUENZA, HIGH DOSE SEASONAL PF 11/05/2016, 10/29/2017, 10/06/2018, 11/03/2019, 10/28/2023   Influenza Inj Mdck Quad Pf 10/28/2017   Influenza Split 11/24/2013   Influenza,inj,Quad PF,6+ Mos 11/08/2014, 11/26/2015   Influenza-Unspecified 10/11/2013, 10/11/2013   PFIZER(Purple Top)SARS-COV-2 Vaccination 05/06/2019, 05/31/2019, 01/27/2020   PNEUMOCOCCAL CONJUGATE-20 06/03/2022   Pneumococcal Conjugate-13 04/18/2014   Pneumococcal Polysaccharide-23 11/26/2015   Respiratory Syncytial Virus Vaccine,Recomb Aduvanted(Arexvy) 10/14/2022   Tdap 02/13/2011   Zoster Recombinant(Shingrix) 11/05/2016, 12/01/2016, 03/02/2017   Zoster, Live 11/07/2010      Objective:     Vitals:   01/28/24 0842  BP: 136/66  Pulse: 60  Temp: 97.8 F (36.6 C)  Height: 5' 11 (1.803 m)  Weight: 126 lb 9.6 oz (57.4 kg)  SpO2: 94%  TempSrc: Temporal  BMI (Calculated): 17.66     SpO2: 94 %  GENERAL: Thin well-developed gentleman, no acute distress.  Well-groomed, fully ambulatory without assistance.  No conversational dyspnea. HEAD: Normocephalic, atraumatic.  EYES: Pupils equal, round, reactive to light.  No scleral icterus.  MOUTH: Dentition intact.  Oral mucosa moist.  No thrush. NECK: Supple. No thyromegaly. Trachea midline. No JVD.  No adenopathy. PULMONARY: Distant breath sounds bilaterally.  Coarse breath sounds, no wheezes or rhonchi noted.  No crackles. CARDIOVASCULAR:  S1 and S2.  Regular rate and rhythm.  No rubs, murmurs or gallops heard. ABDOMEN: Benign. MUSCULOSKELETAL: No joint deformity, no clubbing, no edema.  Muscle tone improved.   NEUROLOGIC: No  overt focal deficit, no gait disturbance, speech is fluent. SKIN: Intact,warm,dry. PSYCH: Mood and behavior normal.  Chest x-ray from 14 January 2024 showing right middle lobe infiltrate (1), chest x-ray today showing improvement on the infiltrate (2):       Assessment & Plan:     ICD-10-CM   1. Community acquired pneumonia of right middle lobe of lung  J18.9 DG Chest 2 View    2. Stage 3 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 (HCC)  J44.9 DG Chest 2 View    3. Vasomotor rhinitis  J30.0       Orders Placed This Encounter  Procedures   DG Chest 2 View    Standing Status:   Future    Expected Date:   03/10/2024    Expiration Date:   01/27/2025    Reason for Exam (SYMPTOM  OR DIAGNOSIS REQUIRED):   F/U Pneumonia    Preferred imaging location?:   [facility]   Discussion:    Community acquired pneumonia Improving with markedly reduced infiltrate on the right side on chest x-ray. No rattling sounds on auscultation. No productive cough.  Completed antibiotic therapy. - Will order follow-up chest x-ray in 6 weeks to ensure complete resolution.  Chronic obstructive pulmonary disease COPD managed with Dupixent , which is preferred over Nucala due to its efficacy in targeting two pathways of inflammation and its benefits in reducing mucus plugging. Lemon ginger crystals are used as an adjunct for symptom relief. - Continue Breo Ellipta , as needed albuterol , Ohtuvayre , and Dupixent  for COPD management. - Use lemon ginger crystals as needed for symptom relief (per patient helps with expectoration).  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 Symptoms include clear nasal discharge when climbing stairs or eating.  Atrovent  nasal is effective in managing symptoms. - Continue current management with Atrovent  nasal.    Follow-up will be in 6 weeks time with chest x-ray at that time.  Advised if symptoms do not improve or worsen, to please contact office for sooner follow up or  seek emergency care.    I spent 32 minutes of dedicated to the care of this patient on the date of this encounter to include pre-visit review of records, face-to-face time with the patient discussing conditions above, post visit ordering of testing, clinical documentation with the electronic health record, making appropriate referrals as documented, and communicating necessary findings to members of the patients care team.     C. Leita Sanders, MD Advanced Bronchoscopy [facility]    *This note was generated using voice recognition software/Dragon and/or AI transcription program.  Despite best efforts to proofread, errors can occur which can change the meaning. Any transcriptional errors that result from this process are unintentional and may not be fully corrected at the time of dictation.    [1]  Allergies Allergen Reactions   Glycopyrrolate  Other (See Comments)    Urinary retention   Tiotropium Bromide  Other (See Comments)    Urinary retention   Penicillins Rash    Has patient had a PCN reaction causing immediate rash, facial/tongue/throat swelling, SOB or lightheadedness with hypotension:Yes Has patient had a PCN reaction causing severe rash involving mucus membranes or skin necrosis:Yes Has patient had a PCN reaction that required hospitalization:No Has patient had a PCN reaction occurring within the last 10 years:No If  all of the above answers are NO, then may proceed with Cephalosporin use.   [2]  Current Meds  Medication Sig   albuterol  (PROVENTIL ) (2.5 MG/3ML) 0.083% nebulizer solution Take 3 mLs (2.5 mg total) by nebulization every 6 (six) hours as needed for wheezing or shortness of breath.   albuterol  (VENTOLIN  HFA) 108 (90 Base) MCG/ACT inhaler Inhale 2 puffs into the lungs every 6 (six) hours as needed.   Creatine POWD by Does not apply route daily.   diltiazem  (CARDIZEM  CD) 120 MG 24 hr capsule TAKE 1 CAPSULE BY MOUTH EVERY DAY   dupilumab   (DUPIXENT ) 300 MG/2ML prefilled syringe Inject 300 mg into the skin every 14 (fourteen) days.   flecainide  (TAMBOCOR ) 50 MG tablet TAKE 1 TABLET BY MOUTH TWICE A DAY   fluticasone  furoate-vilanterol (BREO ELLIPTA ) 200-25 MCG/ACT AEPB Inhale 1 puff into the lungs daily.   furosemide  (LASIX ) 20 MG tablet TAKE 1 TABLET BY MOUTH DAILY AS NEEDED (FOR SWELLING OR WEIGHT GAIN OF 3 LBS OVERNIGHT OR 5 LBS IN A WEEK).   hydrocortisone  (ANUSOL -HC) 25 MG suppository Place 1 suppository (25 mg total) rectally at bedtime.   ipratropium (ATROVENT ) 0.03 % nasal spray Place 2 sprays into both nostrils 3 (three) times daily.   mesalamine  (LIALDA ) 1.2 g EC tablet TAKE 4 TABLETS BY MOUTH DAILY WITH BREAKFAST   Nebulizers (PARI VIOS PRO LC PLUS SYSTEM) MISC Please use as directed.   nitroGLYCERIN  (NITROSTAT ) 0.4 MG SL tablet Place 1 tablet (0.4 mg total) under the tongue every 5 (five) minutes as needed for chest pain.   OHTUVAYRE  3 MG/2.5ML SUSP Inhale 3 mg into the lungs in the morning and at bedtime.   rivaroxaban  (XARELTO ) 20 MG TABS tablet Take 1 tablet (20 mg total) by mouth daily with supper.   rosuvastatin  (CRESTOR ) 10 MG tablet TAKE 1 TABLET BY MOUTH DAILY

## 2024-02-09 ENCOUNTER — Encounter: Payer: Self-pay | Admitting: Family

## 2024-02-09 ENCOUNTER — Ambulatory Visit (INDEPENDENT_AMBULATORY_CARE_PROVIDER_SITE_OTHER): Admitting: Family

## 2024-02-09 VITALS — BP 120/60 | HR 101 | Temp 98.5°F | Ht 70.0 in | Wt 127.4 lb

## 2024-02-09 DIAGNOSIS — L89312 Pressure ulcer of right buttock, stage 2: Secondary | ICD-10-CM

## 2024-02-09 MED ORDER — DOXYCYCLINE HYCLATE 100 MG PO TABS: 100.0000 mg | ORAL_TABLET | Freq: Two times a day (BID) | ORAL | 0 refills | Status: DC

## 2024-02-09 NOTE — Progress Notes (Signed)
 "  Acute Office Visit  Subjective:     Patient ID: Antonio [name], male    DOB: [birth year], 78 y.o.   MRN: [id number]  Chief Complaint  Patient presents with   Skin Problem    HPI Patient is in today with complaints of a sore on his right buttocks has been present 1-1/2 weeks.  He sits often.  He has a history of COPD.  He is also going through banding of some hemorrhoids through GI.  Denies any pain in his buttocks.  Wife just wanted to make sure they had it checked to be sure that they were caring for properly.  She has been applying Bactroban  to the area.  No drainage or discharge.  No foul smell.  Review of Systems  Constitutional: Negative.  Negative for chills and fever.  Respiratory: Negative.    Cardiovascular: Negative.   Gastrointestinal: Negative.   Musculoskeletal: Negative.   Skin:  Negative for itching and rash.       Wound to the right buttocks  Neurological: Negative.   Psychiatric/Behavioral: Negative.    All other systems reviewed and are negative.   Past Medical History:  Diagnosis Date   Allergy    Calf pain    COPD (chronic obstructive pulmonary disease) (HCC)    no Oxygen  per pt   COVID-19    Dyspnea    Essential hypertension    History of echocardiogram    a. 02/2021 Echo: EF 55%, no rwma, nl RV fxn.   Internal hemorrhoids    Mixed hyperlipidemia    Muscle cramp    Nonobstructive CAD (coronary artery disease)    a. 06/2021 MV: EF 65%, no isch/infarct,Cor Ca2+; b. 02/2022 Cor CTA: LM 25, LAD 25-49, LCX nl, OM1/2 nl, RCA nondom, nl. Ca2+ = 252 (42nd%'ile).   PAD (peripheral artery disease)    a. s/p L SFA stenting.   Paroxysmal atrial fibrillation (HCC) 08/2019   a. CHA2DS2VASc = 4-->xarelto /flecainide .   PONV (postoperative nausea and vomiting)    Tobacco abuse 04/25/2014   Formatting of this note might be different from the original.  Last Assessment & Plan:   Today we discussed his high risk of lung cancer given his prior smoking history.  We discussed the risks and benefits of lung cancer screening with a CT scan, he is interested but wants to think about it more before committing. We provided him with literature on lung cancer screening today.     Tubular adenoma of colon    Ulcerative colitis (HCC)     Social History   Socioeconomic History   Marital status: Married    Spouse name: Not on file   Number of children: 2   Years of education: Not on file   Highest education level: Bachelor's degree (e.g., BA, AB, BS)  Occupational History   Occupation: retired  Tobacco Use   Smoking status: Former    Current packs/day: 0.00    Average packs/day: 0.5 packs/day for 35.0 years (17.5 ttl pk-yrs)    Types: Cigarettes, Cigars    Start date: 06/10/1976    Quit date: 06/11/2011    Years since quitting: 12.6   Smokeless tobacco: Never   Tobacco comments:    Former smoker 12/19/2020  Vaping Use   Vaping status: Never Used  Substance and Sexual Activity   Alcohol  use: Yes    Alcohol /week: 7.0 standard drinks of alcohol     Types: 7 Shots of liquor per week    Comment: liquor (eg-  martini, scotch every day)   Drug use: No   Sexual activity: Yes  Other Topics Concern   Not on file  Social History Narrative   married   Social Drivers of Health   Tobacco Use: Medium Risk (02/09/2024)   Patient History    Smoking Tobacco Use: Former    Smokeless Tobacco Use: Never    Passive Exposure: Not on file  Financial Resource Strain: Low Risk (02/08/2024)   Overall Financial Resource Strain (CARDIA)    Difficulty of Paying Living Expenses: Not hard at all  Food Insecurity: No Food Insecurity (02/08/2024)   Epic    Worried About Radiation Protection Practitioner of Food in the Last Year: Never true    Ran Out of Food in the Last Year: Never true  Transportation Needs: No Transportation Needs (02/08/2024)   Epic    Lack of Transportation (Medical): No    Lack of Transportation (Non-Medical): No  Physical Activity: Insufficiently Active (02/08/2024)    Exercise Vital Sign    Days of Exercise per Week: 3 days    Minutes of Exercise per Session: 10 min  Stress: No Stress Concern Present (02/08/2024)   [employer] of Occupational Health - Occupational Stress Questionnaire    Feeling of Stress: Only a little  Social Connections: Socially Integrated (02/08/2024)   Social Connection and Isolation Panel    Frequency of Communication with Friends and Family: More than three times a week    Frequency of Social Gatherings with Friends and Family: More than three times a week    Attends Religious Services: More than 4 times per year    Active Member of Clubs or Organizations: Yes    Attends Banker Meetings: More than 4 times per year    Marital Status: Married  Catering Manager Violence: Not At Risk (08/19/2023)   Epic    Fear of Current or Ex-Partner: No    Emotionally Abused: No    Physically Abused: No    Sexually Abused: No  Depression (PHQ2-9): Low Risk (02/09/2024)   Depression (PHQ2-9)    PHQ-2 Score: 0  Alcohol  Screen: Low Risk (02/08/2024)   Alcohol  Screen    Last Alcohol  Screening Score (AUDIT): 4  Housing: Unknown (02/08/2024)   Epic    Unable to Pay for Housing in the Last Year: No    Number of Times Moved in the Last Year: Not on file    Homeless in the Last Year: No  Utilities: Not At Risk (11/17/2023)   Received from [facility]   Epic    In the past 12 months has the electric, gas, oil, or water company threatened to shut off services in your home?: No  Health Literacy: Adequate Health Literacy (08/19/2023)   B1300 Health Literacy    Frequency of need for help with medical instructions: Never    Past Surgical History:  Procedure Laterality Date   ANTERIOR CERVICAL DECOMP/DISCECTOMY FUSION N/A 05/05/2016   Procedure: ANTERIOR CERVICAL DECOMPRESSION FUSION CERVICAL THREE-FOUR.;  Surgeon: Victory Gunnels, MD;  Location: [facility] OR;  Service: Neurosurgery;  Laterality: N/A;  right side approach    CATARACT EXTRACTION Bilateral    CERVICAL DISCECTOMY  04/2016   C3-C4   COLONOSCOPY     COLONOSCOPY  08/09/2015   HEMORRHOID SURGERY  1988   leg stent Left approx 5-6 yrs ago   2 to left leg   MENISCUS REPAIR Left 12/2013   MOHS SURGERY     POLYPECTOMY  ROTATOR CUFF REPAIR Left 2007   TONSILLECTOMY AND ADENOIDECTOMY  1957    Family History  Problem Relation Age of Onset   Cancer Mother        leukemia   Colon polyps Mother    Heart disease Father    Heart attack Father 64   Healthy Daughter    Healthy Son    Colon cancer Neg Hx    Esophageal cancer Neg Hx    Rectal cancer Neg Hx    Stomach cancer Neg Hx     Allergies[1]  Medications Ordered Prior to Encounter[2]  BP 120/60 (BP Location: Right Arm, Patient Position: Sitting, Cuff Size: Normal)   Pulse (!) 101   Temp 98.5 F (36.9 C) (Oral)   Ht 5' 10 (1.778 m)   Wt 127 lb 6.4 oz (57.8 kg)   SpO2 93%   BMI 18.28 kg/m chart     Objective:    BP 120/60 (BP Location: Right Arm, Patient Position: Sitting, Cuff Size: Normal)   Pulse (!) 101   Temp 98.5 F (36.9 C) (Oral)   Ht 5' 10 (1.778 m)   Wt 127 lb 6.4 oz (57.8 kg)   SpO2 93%   BMI 18.28 kg/m    Physical Exam Vitals and nursing note reviewed. Chaperone present: Wife present.  Constitutional:      Appearance: Normal appearance. He is normal weight.  Cardiovascular:     Rate and Rhythm: Normal rate and regular rhythm.     Pulses: Normal pulses.     Heart sounds: Normal heart sounds.  Pulmonary:     Effort: Pulmonary effort is normal.     Breath sounds: Normal breath sounds.  Abdominal:     General: Abdomen is flat. Bowel sounds are normal.  Musculoskeletal:        General: Normal range of motion.  Skin:    General: Skin is warm and dry.         Comments: Dime size stage II pressure ulcer noted.  Soft.  No drainage or discharge.  Skin appears healthy.  Mild bruising surrounding.  Neurological:     General: No focal deficit present.      Mental Status: He is alert and oriented to person, place, and time. Mental status is at baseline.  Psychiatric:        Mood and Affect: Mood normal.        Thought Content: Thought content normal.        Judgment: Judgment normal.     No results found for any visits on 02/09/24.      Assessment & Plan:   Problem List Items Addressed This Visit   None Visit Diagnoses       Decubitus ulcer of right buttock, stage 2 (HCC)    -  Primary      Appears to be resolving.  Will add doxycycline  empirically to cover for infection.  If symptoms persist, we can refer to the wound care clinic.  For now, continue to clean, dress, bandage.  Use a doughnut pillow to help with circulation to that area.  Call if symptoms worsen or persist. Meds ordered this encounter  Medications   doxycycline  (VIBRA -TABS) 100 MG tablet    Sig: Take 1 tablet (100 mg total) by mouth 2 (two) times daily.    Dispense:  20 tablet    Refill:  0    No follow-ups on file.  Nami Strawder B Timia Casselman, FNP      [1]  Allergies  Allergen Reactions   Glycopyrrolate  Other (See Comments)    Urinary retention   Tiotropium Bromide  Other (See Comments)    Urinary retention   Penicillins Rash    Has patient had a PCN reaction causing immediate rash, facial/tongue/throat swelling, SOB or lightheadedness with hypotension:Yes Has patient had a PCN reaction causing severe rash involving mucus membranes or skin necrosis:Yes Has patient had a PCN reaction that required hospitalization:No Has patient had a PCN reaction occurring within the last 10 years:No If all of the above answers are NO, then may proceed with Cephalosporin use.   [2]  Current Outpatient Medications on File Prior to Visit  Medication Sig Dispense Refill   albuterol  (PROVENTIL ) (2.5 MG/3ML) 0.083% nebulizer solution Take 3 mLs (2.5 mg total) by nebulization every 6 (six) hours as needed for wheezing or shortness of breath. 75 mL 12   albuterol  (VENTOLIN  HFA) 108  (90 Base) MCG/ACT inhaler Inhale 2 puffs into the lungs every 6 (six) hours as needed. 8 g 4   Creatine POWD by Does not apply route daily.     diltiazem  (CARDIZEM  CD) 120 MG 24 hr capsule TAKE 1 CAPSULE BY MOUTH EVERY DAY 90 capsule 3   dupilumab  (DUPIXENT ) 300 MG/2ML prefilled syringe Inject 300 mg into the skin every 14 (fourteen) days. 12 mL 1   flecainide  (TAMBOCOR ) 50 MG tablet TAKE 1 TABLET BY MOUTH TWICE A DAY 180 tablet 2   fluticasone  furoate-vilanterol (BREO ELLIPTA ) 200-25 MCG/ACT AEPB Inhale 1 puff into the lungs daily. 180 each 3   furosemide  (LASIX ) 20 MG tablet TAKE 1 TABLET BY MOUTH DAILY AS NEEDED (FOR SWELLING OR WEIGHT GAIN OF 3 LBS OVERNIGHT OR 5 LBS IN A WEEK). 90 tablet 1   hydrocortisone  (ANUSOL -HC) 25 MG suppository Place 1 suppository (25 mg total) rectally at bedtime. 28 suppository 0   ipratropium (ATROVENT ) 0.03 % nasal spray Place 2 sprays into both nostrils 3 (three) times daily. 30 mL 12   mesalamine  (LIALDA ) 1.2 g EC tablet TAKE 4 TABLETS BY MOUTH DAILY WITH BREAKFAST 120 tablet 0   Nebulizers (PARI VIOS PRO LC PLUS SYSTEM) MISC Please use as directed. 1 each 0   nitroGLYCERIN  (NITROSTAT ) 0.4 MG SL tablet Place 1 tablet (0.4 mg total) under the tongue every 5 (five) minutes as needed for chest pain. 25 tablet 3   OHTUVAYRE  3 MG/2.5ML SUSP Inhale 3 mg into the lungs in the morning and at bedtime.     rivaroxaban  (XARELTO ) 20 MG TABS tablet Take 1 tablet (20 mg total) by mouth daily with supper. 90 tablet 3   rosuvastatin  (CRESTOR ) 10 MG tablet TAKE 1 TABLET BY MOUTH DAILY 90 tablet 3   Current Facility-Administered Medications on File Prior to Visit  Medication Dose Route Frequency Provider Last Rate Last Admin   albuterol  (PROVENTIL ) (2.5 MG/3ML) 0.083% nebulizer solution 2.5 mg  2.5 mg Nebulization Once Tamea Dedra CROME, MD       "

## 2024-02-13 ENCOUNTER — Other Ambulatory Visit: Payer: Self-pay | Admitting: Internal Medicine

## 2024-02-15 ENCOUNTER — Telehealth: Payer: Self-pay

## 2024-02-15 DIAGNOSIS — L89312 Pressure ulcer of right buttock, stage 2: Secondary | ICD-10-CM | POA: Insufficient documentation

## 2024-02-15 NOTE — Telephone Encounter (Signed)
 Please reach out to the patient to let him know I strongly recommend wound care referral as they are often have a wait list. If his wound is resolved he can always call to cancel the appointment. I will go ahead and put in a referral for wound care team if patient agrees.  I do not recommend regular use of mupirocin  ointment for decubitus ulcer unless the lesion itself is small, superficial. I do not recommend use of Mupirocin  for more than 5 days in a row. The best way to treat pressure ulcer is with wound care, proper dressing and using antibiotic only for infection.   I also recommend referral to RN case manager who can follow up with the patient on progress. If he is okay with above plan I will go ahead and refer him to wound care as well as VBCI RN case production designer, theatre/television/film.   Thank you,  Luke Shade, MD

## 2024-02-15 NOTE — Telephone Encounter (Signed)
 Copied from CRM #[id number]. Topic: Clinical - Medical Advice >> Feb 15, 2024 10:04 AM Thersia BROCKS wrote: Reason for CRM: Patient wife called in stated wasn't sure if they needed to get a referral to wound clinic , has gotten better some but patient wants to wait two more days wanted to know if a new prescription could be called in for the cream mupirocin 

## 2024-02-16 NOTE — Telephone Encounter (Signed)
 Noted

## 2024-02-16 NOTE — Telephone Encounter (Signed)
 Urgent referral to wound clinic made. Referral to RN case manager made as well.   1. Decubitus ulcer of right buttock, stage 2 (HCC) (Primary) - AMB Referral [facility] Care Management - AMB referral to wound care center/Urgent  Luke Shade, MD

## 2024-02-16 NOTE — Telephone Encounter (Signed)
 Spoke with patient to make him aware of Dr Graylon recommendations. Patient states he is OK with having the referrals placed. Patient was advised that it could take up to the end of next week before he hears anything, but if he doesn't to reach out to our office. Patient verbalized understanding and has no further questions at this time.

## 2024-02-19 ENCOUNTER — Other Ambulatory Visit: Payer: Self-pay | Admitting: Cardiology

## 2024-02-22 ENCOUNTER — Telehealth: Payer: Self-pay

## 2024-02-22 NOTE — Progress Notes (Signed)
 Complex Care Management Note  Care Guide Note 02/22/2024 Name: Arlen Dupuis MRN: [id number] DOB: Sep 29, 1945  Yuriel Lopezmartinez is a 79 y.o. year old male who sees Bair, Kalpana, MD for primary care. I reached out to [facility] by phone today to offer complex care management services.  Mr. Chew was given information about Complex Care Management services today including:   The Complex Care Management services include support from the care team which includes your Nurse Care Manager, Clinical Social Worker, or Pharmacist.  The Complex Care Management team is here to help remove barriers to the health concerns and goals most important to you. Complex Care Management services are voluntary, and the patient may decline or stop services at any time by request to their care team member.   Complex Care Management Consent Status: Patient agreed to services and verbal consent obtained.   Follow up plan:  Telephone appointment with complex care management team member scheduled for:  03/04/24 at 9:00 a.m.   Encounter Outcome:  Patient Scheduled  Dreama [facility]  [facility], [facility] VBCI Assistant Direct Dial: [phone]  Fax: [phone]

## 2024-03-02 ENCOUNTER — Encounter: Payer: Self-pay | Admitting: Internal Medicine

## 2024-03-02 ENCOUNTER — Ambulatory Visit: Admitting: Internal Medicine

## 2024-03-02 VITALS — Ht 71.0 in | Wt 126.0 lb

## 2024-03-02 DIAGNOSIS — K642 Third degree hemorrhoids: Secondary | ICD-10-CM

## 2024-03-02 DIAGNOSIS — K5120 Ulcerative (chronic) proctitis without complications: Secondary | ICD-10-CM

## 2024-03-02 DIAGNOSIS — L89151 Pressure ulcer of sacral region, stage 1: Secondary | ICD-10-CM

## 2024-03-02 DIAGNOSIS — K648 Other hemorrhoids: Secondary | ICD-10-CM

## 2024-03-02 NOTE — Patient Instructions (Addendum)
 Please purchase the following medications over the counter and take as directed: Desitin cream to apply to bed sore as needed.   Continue Lialda  at current dose.   _______________________________________________________  If your blood pressure at your visit was 140/90 or greater, please contact your primary care physician to follow up on this.  _______________________________________________________  If you are age 81 or older, your body mass index should be between 23-30. Your Body mass index is 17.57 kg/m. If this is out of the aforementioned range listed, please consider follow up with your Primary Care Provider.  If you are age 51 or younger, your body mass index should be between 19-25. Your Body mass index is 17.57 kg/m. If this is out of the aformentioned range listed, please consider follow up with your Primary Care Provider.   ________________________________________________________  The [facility] providers would like to encourage you to use MYCHART to communicate with providers for non-urgent requests or questions.  Due to long hold times on the telephone, sending your provider a message by [facility] may be a faster and more efficient way to get a response.  Please allow 48 business hours for a response.  Please remember that this is for non-urgent requests.  _______________________________________________________  [facility] is using a team-based approach to care.  Your team is made up of your doctor and two to three APPS. Our APPS (Nurse Practitioners and Physician Assistants) work with your physician to ensure care continuity for you. They are fully qualified to address your health concerns and develop a treatment plan. They communicate directly with your gastroenterologist to care for you. Seeing the Advanced Practice Practitioners on your physician's team can help you by facilitating care more promptly, often allowing for earlier appointments, access to diagnostic  testing, procedures, and other specialty referrals.

## 2024-03-02 NOTE — Progress Notes (Signed)
 "  Subjective:    Patient ID: Antonio [name], male    DOB: 22-Jan-1946, 80 y.o.   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  He reports new onset rectal bleeding and seepage today, primarily on the right side. He is unsure if the current discharge is blood or just seepage. No bleeding occurred throughout December or the past month and a half. His wife notes that previous episodes of bleeding were significant enough to soil his clothes, but this has not recurred recently. He denies pain with prior banding procedures and finds them helpful. He continues Xarelto  daily. No constipation or hard stools this week; stools have remained soft with regular use of Colace and increased fluid intake. Hemorrhoid banding was performed on the right side in December 2024 and Jan 2025, treating both right posterior and right anterior hemorrhoids. The right side tends to recur but has improved after prior banding.  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 He is maintained on Lialda  2.4 g, two tablets daily, and has not had recent problems with bowel movements, describing them as great for the past month and a half.  A pressure sore developed on the right buttock/tailbone area, attributed to prolonged use of a feminine hygiene pad cut in half to prevent leakage. The area was initially raw and black, treated with antibiotic cream and Neosporin, and has significantly improved. He denies pain in the pelvis or buttocks and is no longer using the pads. He does not remain in bed all day and has considered using a donut cushion. An appointment at the wound clinic has been scheduled but not yet attended.   Review of Systems As per HPI, otherwise negative  Current Medications, Allergies, Past Medical History, Past Surgical History, Family History  and Social History were reviewed in [employer] record.    Objective:   Physical Exam Ht 5' 11 (1.803 m)   Wt 126 lb (57.2 kg)   BMI 17.57 kg/m  Gen: awake, alert, NAD HEENT: anicteric /l Rectal: External exam shows increased erythema without ulceration over the right buttock, image review on the wife's phone shows an ulceration which has healed there is no fluctuance or increased warmth Ext: no c/c/e Neuro: nonfocal  ANOSCOPY: Using a disposable, lubricated, slotted, self-illuminating anoscope, the rectum was intubated without difficulty. The trochar was removed and the ano-rectum was circumferentially inspected. There were internal hemorrhoids, right lateral. There was no finding of an anorectal fissure. The rectal mucosa was not inflamed. No neoplasia or other pathology was identified. The inspection was well tolerated.      Assessment & Plan:    Hemorrhoids with bleeding, internal Chronic right-sided internal hemorrhoid with minor bleeding due to anticoagulation. No proctitis or mucosal inflammation on anoscopy; bleeding from hemorrhoidal source. - Performed anoscopic examination. - Performed rubber band ligation of right-sided hemorrhoid. - Scheduled follow-up in March for reassessment.   PROCEDURE NOTE:  The patient presents with symptomatic grade 2 to 3 internal hemorrhoids, requesting rubber band ligation of his her hemorrhoidal disease.  All risks, benefits and alternative forms of therapy were described and informed consent was obtained.   The anorectum was pre-medicated with 0.125% nitroglycerin  ointment The decision was made to band the Right lateral internal hemorrhoid, and the [facility] ORegan System was used to perform band ligation without complication.   Digital anorectal examination was then  performed to assure proper positioning of the band, and to adjust the banded tissue as required.  The patient was discharged home without pain or other  issues.  Dietary and behavioral recommendations were given and along with follow-up instructions.     The patient will return as scheduled for follow-up and possible additional banding as required. No complications were encountered and the patient tolerated the procedure well.   2.  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 Etiology consistent with pressure sore. - Recommended discontinuing feminine hygiene pads. - Recommended absorbent adult briefs to minimize skin trauma. - Advised application of creamy Desitin as a protective barrier. - No antibiotics needed.  3.  Ulcerative proctitis/sigmoiditis, diagnosis 2015 Stable, mesalamine  1.2 g twice daily No evidence of proctitis at least by anoscopy, no diarrhea - Continue Lialda  2.4 g daily -We discussed colonoscopy for surveillance and he wishes to defer  35 minutes total spent today including patient facing time, coordination of care, reviewing medical history/procedures/pertinent radiology studies, and documentation of the encounter.  "

## 2024-03-04 ENCOUNTER — Other Ambulatory Visit: Payer: Self-pay

## 2024-03-04 NOTE — Patient Outreach (Signed)
 Complex Care Management   Visit Note  03/04/2024  Name:  Antonio [name] MRN: [id number] DOB: 1945/07/14  Situation: Referral received for Complex Care Management related to COPD I obtained verbal consent from Patient.  Visit completed with Patient  on the phone  Background:   Past Medical History:  Diagnosis Date   Allergy    Calf pain    COPD (chronic obstructive pulmonary disease) (HCC)    no Oxygen  per pt   COVID-19    Dyspnea    Essential hypertension    History of echocardiogram    a. 02/2021 Echo: EF 55%, no rwma, nl RV fxn.   Internal hemorrhoids    Mixed hyperlipidemia    Muscle cramp    Nonobstructive CAD (coronary artery disease)    a. 06/2021 MV: EF 65%, no isch/infarct,Cor Ca2+; b. 02/2022 Cor CTA: LM 25, LAD 25-49, LCX nl, OM1/2 nl, RCA nondom, nl. Ca2+ = 252 (42nd%'ile).   PAD (peripheral artery disease)    a. s/p L SFA stenting.   Paroxysmal atrial fibrillation (HCC) 08/2019   a. CHA2DS2VASc = 4-->xarelto /flecainide .   PONV (postoperative nausea and vomiting)    Tobacco abuse 04/25/2014   Formatting of this note might be different from the original.  Last Assessment & Plan:   Today we discussed his high risk of lung cancer given his prior smoking history. We discussed the risks and benefits of lung cancer screening with a CT scan, he is interested but wants to think about it more before committing. We provided him with literature on lung cancer screening today.     Tubular adenoma of colon    Ulcerative colitis (HCC)     Assessment: Patient Reported Symptoms:  Cognitive Cognitive Status: Alert and oriented to person, place, and time, Insightful and able to interpret abstract concepts, Normal speech and language skills Cognitive/Intellectual Conditions Management [RPT]: None reported or documented in medical history or problem list   Health Maintenance Behaviors: Annual physical exam, Healthy diet, Hobbies, Immunizations, Sleep adequate, Social activities,  Spiritual practice(s), Stress management Healing Pattern: Fast Health Facilitated by: Healthy diet, Rest, Prayer/meditation, Stress management  Neurological Neurological Review of Symptoms: No symptoms reported Neurological Management Strategies: Routine screening  HEENT HEENT Symptoms Reported: No symptoms reported HEENT Management Strategies: Routine screening    Cardiovascular Cardiovascular Symptoms Reported: No symptoms reported Does patient have uncontrolled Hypertension?: No Cardiovascular Management Strategies: Medication therapy, Diet modification, Routine screening Weight: 127 lb 12.8 oz (58 kg) Cardiovascular Comment: reports weight stable, does not use/need lasix  for weight gain - reports was short term after hospital about 2 years ago, does not weigh self daily - no issues with fluid retention/swelling/HF, no afib sx, discussed sx of afib and patient reported has metoprolol  25 mg prn for sx/HR > 110 with direction to repeat in 2 hours if no improvement - made aware to call doctor if needed and sx for eval and red flags for ED, education mailed re afib reports HR ranges 55-66  Respiratory Respiratory Symptoms Reported: Shortness of breath Other Respiratory Symptoms: SOB with activity - walking long way, lifting, does not wheeze typically, no cough, triggers pollen, hx pneumonia, has O2 2L, uses at night and if walking longer distance and prn, does deep breathing exercies throughout the day, taking maintenance inhaler as ordered, reports using neb 2x/day and inhaler 2-3x/day regularly (ordered prn) but denies sx prompting use, aware to notify pulmonary of regular use of prn meds on 03/17/24 appointment and advised to see how he feels  without  using nebs since no sx, and to monitor pulse ox, resume prn nebs if needed and notify pulmonary Additional Respiratory Details: PO2 ranges 94-97%, goal above 90%, discussed COPD action plan, sx to call orivder and red flags for ED, practices energy  conservation, mailing education and will review again next visit Respiratory Management Strategies: Oxygen  therapy, Routine screening, Medication therapy  Endocrine Endocrine Symptoms Reported: Not assessed Is patient diabetic?: No    Gastrointestinal Gastrointestinal Symptoms Reported: No symptoms reported Additional Gastrointestinal Details: appetite good, reports healthy diet, drinks sweet tea and electrolye water, had hemorrhoid banded yesterday, no issues Gastrointestinal Management Strategies: Diet modification    Genitourinary Genitourinary Symptoms Reported: No symptoms reported    Integumentary Integumentary Symptoms Reported: No symptoms reported Additional Integumentary Details: skin issues resolved    Musculoskeletal Musculoskelatal Symptoms Reviewed: No symptoms reported Additional Musculoskeletal Details: no falls, no assistive devices, limited walking due to COPD - takes O2 when going out Musculoskeletal Management Strategies: Routine screening Falls in the past year?: No Number of falls in past year: 1 or less Was there an injury with Fall?: No Fall Risk Category Calculator: 0 Patient Fall Risk Level: Low Fall Risk Fall risk Follow up: Falls evaluation completed, Education provided, Falls prevention discussed  Psychosocial Psychosocial Symptoms Reported: No symptoms reported Behavioral Management Strategies: Support system Major Change/Loss/Stressor/Fears (CP): Denies Behaviors When Feeling Stressed/Fearful: manage the best I can Quality of Family Relationships: helpful, involved, supportive Do you feel physically threatened by others?: No    03/04/2024    PHQ2-9 Depression Screening   Little interest or pleasure in doing things Not at all  Feeling down, depressed, or hopeless Not at all  PHQ-2 - Total Score 0  Trouble falling or staying asleep, or sleeping too much    Feeling tired or having little energy    Poor appetite or overeating     Feeling bad about  yourself - or that you are a failure or have let yourself or your family down    Trouble concentrating on things, such as reading the newspaper or watching television    Moving or speaking so slowly that other people could have noticed.  Or the opposite - being so fidgety or restless that you have been moving around a lot more than usual    Thoughts that you would be better off dead, or hurting yourself in some way    PHQ2-9 Total Score    If you checked off any problems, how difficult have these problems made it for you to do your work, take care of things at home, or get along with other people    Depression Interventions/Treatment      Today's Vitals   03/04/24 0941  Weight: 127 lb 12.8 oz (58 kg)      Medications Reviewed Today     Reviewed by Devra Lands, RN (Registered Nurse) on 03/04/24 at [phone]  Med List Status: <None>   Medication Order Taking? Sig Documenting Provider Last Dose Status Informant  albuterol  (PROVENTIL ) (2.5 MG/3ML) 0.083% nebulizer solution [id number] Yes Take 3 mLs (2.5 mg total) by nebulization every 6 (six) hours as needed for wheezing or shortness of breath. Tamea Dedra CROME, MD  Active   albuterol  (PROVENTIL ) (2.5 MG/3ML) 0.083% nebulizer solution 2.5 mg [id number]   Tamea Dedra CROME, MD  Active   albuterol  (VENTOLIN  HFA) 108 (90 Base) MCG/ACT inhaler [id number] Yes Inhale 2 puffs into the lungs every 6 (six) hours as needed. Tamea Dedra CROME, MD  Active  Creatine POWD [id number]  by Does not apply route daily.  Patient not taking: Reported on 03/04/2024   [provider]  Consider Medication Status and Discontinue   diltiazem  (CARDIZEM  CD) 120 MG 24 hr capsule [id number] Yes TAKE 1 CAPSULE BY MOUTH EVERY DAY Riddle, Suzann, NP  Active   doxycycline  (VIBRA -TABS) 100 MG tablet [id number]  Take 1 tablet (100 mg total) by mouth 2 (two) times daily.  Patient not taking: Reported on 03/04/2024   Webb, Padonda B, FNP  Consider Medication Status and  Discontinue   dupilumab  (DUPIXENT ) 300 MG/2ML prefilled syringe [id number] Yes Inject 300 mg into the skin every 14 (fourteen) days. Tamea Dedra CROME, MD  Active   flecainide  (TAMBOCOR ) 50 MG tablet [id number] Yes TAKE 1 TABLET BY MOUTH TWICE A DAY Riddle, Suzann, NP  Active   fluticasone  furoate-vilanterol (BREO ELLIPTA ) 200-25 MCG/ACT AEPB [id number] Yes Inhale 1 puff into the lungs daily. Tamea Dedra CROME, MD  Active   furosemide  (LASIX ) 20 MG tablet [id number]  TAKE 1 TABLET BY MOUTH DAILY AS NEEDED (FOR SWELLING OR WEIGHT GAIN OF 3 LBS OVERNIGHT OR 5 LBS IN A WEEK).  Patient not taking: Reported on 03/04/2024   End, Lonni, MD  Active Self  hydrocortisone  (ANUSOL -HC) 25 MG suppository [id number] Yes Place 1 suppository (25 mg total) rectally at bedtime. Albertus Gordy HERO, MD  Active   ipratropium (ATROVENT ) 0.03 % nasal spray [id number] Yes Place 2 sprays into both nostrils 3 (three) times daily. Tamea Dedra CROME, MD  Active   mesalamine  (LIALDA ) 1.2 g EC tablet [id number] Yes TAKE 4 TABLETS BY MOUTH DAILY WITH BREAKFAST Pyrtle, Gordy HERO, MD  Active Self  Nebulizers (PARI VIOS PRO [facility] PLUS SYSTEM) MISC [id number] Yes Please use as directed. Tamea Dedra CROME, MD  Active   nitroGLYCERIN  (NITROSTAT ) 0.4 MG SL tablet [id number] Yes Place 1 tablet (0.4 mg total) under the tongue every 5 (five) minutes as needed for chest pain. Riddle, Suzann, NP  Active   OHTUVAYRE  3 MG/2.5ML SUSP [id number] Yes Inhale 3 mg into the lungs in the morning and at bedtime. [provider]  Active   rivaroxaban  (XARELTO ) 20 MG TABS tablet [id number] Yes Take 1 tablet (20 mg total) by mouth daily with supper. Riddle, Suzann, NP  Active   rosuvastatin  (CRESTOR ) 10 MG tablet [id number] Yes TAKE 1 TABLET BY MOUTH DAILY End, Lonni, MD  Active             Recommendation:   PCP Follow-up Specialty provider follow-up Pulmonary 03/17/2024 Continue Current Plan of Care  Follow Up Plan:   Telephone follow-up 2 Dionic[address] [place]  to review education and determine if he would like to contiue with RNCM  Nestora Duos, MSN, RN [facility]  [facility], [facility] RN Care Manager Direct Dial: [phone] Fax: [phone]

## 2024-03-04 NOTE — Patient Instructions (Signed)
 Visit Information  Thank you for taking time to visit with me today. Please don't hesitate to contact me if I can be of assistance to you before our next scheduled appointment.  Our next appointment is by telephone on 03/24/2024 at 9:00 am Please call the care guide team at [phone] if you need to cancel or reschedule your appointment.   Following is a copy of your care plan:   Goals Addressed             This Visit's Progress    [facility] RN Care Plan - COPD       Problems:  Chronic Disease Management support and education needs related to Asthma and COPD  Goal: Over the next 30  days the Patient will attend all scheduled medical appointments: patient will attend appointments as evidenced by chart review        continue to work with RN Care Manager and/or Social Worker to address care management and care coordination needs related to Asthma and COPD as evidenced by adherence to care management team scheduled appointments     take all medications exactly as prescribed and will call provider for medication related questions as evidenced by patient report    verbalize understanding of plan for management of Asthma and COPD as evidenced by taking medications as prescribed, promptly contacting provider for symptoms/concerns/questions, avoid/limit exposure to triggers, follow COPD and Asthma Action Plans (provided), O2 2L at night and as needed during the day, lifestyle modifications including continue energy conservation, healthy diet, exercise as tolerated Discuss regular use of inhaler and nebulizer meds without symptoms prior to use with Pulmonary at 03/17/24 appointment to determine if med changes needed.   Interventions:   Asthma: Provided patient with basic written and verbal Asthma education on self care/management/and exacerbation prevention Advised patient to track and manage Asthma triggers Provided instruction about proper use of medications used for management of Asthma including  inhalers Advised patient to self assesses Asthma action plan zone and make appointment with provider if in the yellow zone for 48 hours without improvement Provided education about and advised patient to utilize infection prevention strategies to reduce risk of respiratory infection Discussed the importance of adequate rest and management of fatigue with Asthma Screening for signs and symptoms of depression related to chronic disease state  Assessed social determinant of health barriers    COPD Interventions: Advised patient to track and manage COPD triggers Advised patient to self assesses COPD action plan zone and make appointment with provider if in the yellow zone for 48 hours without improvement Assessed social determinant of health barriers Discussed the importance of adequate rest and management of fatigue with COPD Provided education about and advised patient to utilize infection prevention strategies to reduce risk of respiratory infection Provided instruction about proper use of medications used for management of COPD including inhalers Provided patient with basic written and verbal COPD education on self care/management/and exacerbation prevention Screening for signs and symptoms of depression related to chronic disease state  Use of home oxygen   Patient Self-Care Activities:  Attend all scheduled provider appointments Call pharmacy for medication refills 3-7 days in advance of running out of medications Call provider office for new concerns or questions  Take medications as prescribed   Work with the social worker to address care coordination needs and will continue to work with the clinical team to address health care and disease management related needs identify and remove indoor air pollutants limit outdoor activity during cold weather do  breathing exercises every day develop a rescue plan eliminate symptom triggers at home follow rescue plan if symptoms flare-up keep  follow-up appointments: Pulmonary 03/17/2024 don't eat or exercise right before bedtime eat healthy/prescribed diet: heart healthy low salt get at least 7 to 8 hours of sleep at night  Plan:  Telephone follow up appointment with care management team member scheduled for:  03/24/2024 at 9:00 am to review education and determine if would like to continue with Care Management             Please call the Suicide and Crisis Lifeline: 988 call the [place]  National Suicide Prevention Lifeline: 412-730-5283 or TTY: 405-561-2176 TTY 2512549711) to talk to a trained counselor call 1-800-273-TALK (toll free, 24 hour hotline) go to [facility] [address], [place] [phone]) call 911 if you are experiencing a Mental Health or Behavioral Health Crisis or need someone to talk to.  Patient verbalized understanding of Care plan and visit instructions communicated this visit - mailing as discussed  Nestora Duos, MSN, RN [facility]  [facility], [facility] RN Care Manager Direct Dial: [phone] Fax: [phone]   COPD Action Plan A COPD action plan is a description of what to do when you have a flare (exacerbation) of chronic obstructive pulmonary disease (COPD). Your action plan is a color-coded plan that lists the symptoms that indicate whether your condition is under control and what actions to take. If you have symptoms in the green zone, it means you are doing well that day. If you have symptoms in the yellow zone, it means you are having a bad day or an exacerbation. If you have symptoms in the red zone, you need urgent medical care. Follow the plan that you and your health care provider developed. Review your plan with your health care provider at each visit. Red zone Symptoms in this zone mean that you should get medical help right away. They include: Feeling very short of breath, even when you are resting. Not  being able to do any activities because of poor breathing. Not being able to sleep because of poor breathing. Fever or shaking chills. Feeling confused or very sleepy. Chest pain. Coughing up blood. If you have any of these symptoms, call emergency services (911 in the [place] or go to the nearest emergency room. Yellow zone Symptoms in this zone mean that your condition may be getting worse. They include: Feeling more short of breath than usual. Having less energy for daily activities than usual. Phlegm or mucus that is thicker than usual. Needing to use your rescue inhaler or nebulizer more often than usual. More ankle swelling than usual. Coughing more than usual. Feeling like you have a chest cold. Trouble sleeping due to COPD symptoms. Decreased appetite. COPD medicines not helping as much as usual. If you experience any yellow symptoms: Keep taking your daily medicines as directed. Use your quick-relief inhaler as told by your health care provider. If you were prescribed steroid medicine to take by mouth (oral medicine), start taking it as told by your health care provider. If you were prescribed an antibiotic medicine, start taking it as told by your health care provider. Do not stop taking the antibiotic even if you start to feel better. Use oxygen  as told by your health care provider. Get more rest. Do your pursed-lip breathing exercises. Do not smoke. Avoid any irritants in the air. If your signs and symptoms do not improve after taking these steps,  call your health care provider right away. Green zone Symptoms in this zone mean that you are doing well. They include: Being able to do your usual activities and exercise. Having the usual amount of coughing, including the same amount of phlegm or mucus. Being able to sleep well. Having a good appetite. Where to find more information: You can find more information about COPD from: American Lung Association, My COPD Action  Plan: www.lung.org COPD Foundation: www.copdfoundation.org National Heart, Lung, & Blood Institute: popsteam.is Follow these instructions at home: Continue taking your daily medicines as told by your health care provider. Make sure you receive all the immunizations that your health care provider recommends, especially the pneumococcal and influenza vaccines. Wash your hands often with soap and water. Have family members wash their hands too. Regular hand washing can help prevent infections. Follow your usual exercise and diet plan. Avoid irritants in the air, such as smoke. Do not use any products that contain nicotine or tobacco. These products include cigarettes, chewing tobacco, and vaping devices, such as e-cigarettes. If you need help quitting, ask your health care provider. Summary A COPD action plan tells you what to do when you have a flare (exacerbation) of chronic obstructive pulmonary disease (COPD). Follow each action plan for your symptoms. If you have any symptoms in the red zone, call emergency services (911 in the [place] or go to the nearest emergency room. This information is not intended to replace advice given to you by your health care provider. Make sure you discuss any questions you have with your health care provider. Document Revised: 12/11/2022 Document Reviewed: 12/11/2022 Elsevier Patient Education  2024 Elsevier Inc.    Asthma Action Plan, Adult An asthma action plan helps you understand how to manage your asthma and what to do when you have an asthma attack. The action plan is a color-coded plan that lists the symptoms that indicate whether or not your condition is under control and what actions to take. If you have symptoms in the green zone, you are doing well. If you have symptoms in the yellow zone, you are having problems. If you have symptoms in the red zone, you need medical care right away. Follow the plan that you and your health care provider  develop. Review your plan with your health care provider at each visit. What triggers your asthma? Knowing the things that can trigger an asthma attack or make your asthma symptoms worse is very important. Talk to your health care provider about your asthma triggers and how to avoid them. Record your known asthma triggers here: _______________ What is your personal best peak flow reading? If you use a peak flow meter, determine your personal best reading. Record it here: _______________ Landy zone This zone means that your asthma is under control. You may not have any symptoms while you are in the green zone. This means that you: Have no coughing or wheezing, even while you are working or playing. Sleep through the night. Are breathing well. Have a peak flow reading that is above __________ (80% of your personal best or greater). If you are in the green zone, continue to manage your asthma as directed. Take these medicines every day: Controller medicine and dosage: _______________ Controller medicine and dosage: _______________ Controller medicine and dosage: _______________ Controller medicine and dosage: _______________ Before exercise, use this reliever or rescue medicine: _______________ Call your health care provider if you are using a reliever or rescue medicine more than 2-3 times a week. Yellow  zone Symptoms in this zone mean that your condition may be getting worse. You may have symptoms that interfere with exercise, are noticeably worse after exposure to triggers, or are worse at the first sign of a cold (upper respiratory infection). These may include: Waking from sleep. Coughing, especially at night or first thing in the morning. Mild wheezing. Chest tightness. A peak flow reading that is __________ to __________ (50-79% of your personal best). If you have any of these symptoms: Add the following medicine to the ones that you use daily: Reliever or rescue medicine and dosage:  _______________ Additional medicine and dosage: _______________ Call your health care provider if: You remain in the yellow zone for __________ hours. You are using a reliever or rescue medicine more than 2-3 times a week. Red zone Symptoms in this zone mean that you should get medical help right away. You will likely feel distressed and have symptoms at rest that restrict your activity. You are in the red zone if: You are breathing hard and quickly. Your nose opens wide, your ribs show, and your neck muscles become visible when you breathe in. Your lips, fingers, or toes are a bluish color. You have trouble speaking in full sentences. Your peak flow reading is less than __________ (less than 50% of your personal best). Your symptoms do not improve within 15-20 minutes after you use your reliever or rescue medicine (bronchodilator). If you have any of these symptoms: These symptoms represent a serious problem that is an emergency. Do not wait to see if the symptoms will go away. Get medical help right away. Call your local emergency services (911 in the [place] Do not drive yourself to the hospital. Use your reliever or rescue medicine. Start a nebulizer treatment or take 2-4 puffs from a metered-dose inhaler with a spacer. Repeat this action every 15-20 minutes until help arrives. Where to find more information You can find more information about asthma from: Centers for Disease Control and Prevention: footballexhibition.com.br American Lung Association: www.lung.org This information is not intended to replace advice given to you by your health care provider. Make sure you discuss any questions you have with your health care provider. Document Revised: 03/22/2020 Document Reviewed: 03/22/2020 Elsevier Patient Education  2024 Elsevier Inc.   Atrial Fibrillation Atrial fibrillation (AFib) is a type of heartbeat that is irregular or fast. If you have AFib, your heart beats without any order. This makes  it hard for your heart to pump blood in a normal way. AFib may come and go, or it may become a long-lasting problem. If AFib is not treated, it can put you at higher risk for stroke, heart failure, and other heart problems. What are the causes? AFib may be caused by diseases that damage the heart's electrical system. They include: High blood pressure. Heart failure. Heart valve diseases. Heart surgery. Diabetes. Thyroid  disease. Kidney disease. Lung diseases, such as pneumonia or COPD. Sleep apnea. Sometimes the cause is not known. What increases the risk? You are more likely to develop AFib if: You are older. You exercise often and very hard. You have a family history of AFib. You are male. You are Caucasian. You are overweight. You smoke. You drink a lot of alcohol . What are the signs or symptoms? Common symptoms of this condition include: A feeling that your heart is beating very fast. Chest pain or discomfort. Feeling short of breath. Suddenly feeling light-headed or weak. Getting tired easily during activity. Fainting. Sweating. In some cases, there are  no symptoms. How is this treated? Medicines to: Prevent blood clots. Treat heart rate or heart rhythm problems. Using devices, such as a pacemaker, to correct heart rhythm problems. Doing surgery to remove the part of the heart that sends bad signals. Closing an area where clots can form in the heart (left atrial appendage). In some cases, your doctor will treat other underlying conditions. Follow these instructions at home: Medicines Take over-the-counter and prescription medicines only as told by your doctor. Do not take any new medicines without first talking to your doctor. If you are taking blood thinners: Talk with your doctor before taking aspirin or NSAIDs, such as ibuprofen. Take your medicines as told. Take them at the same time each day. Do not do things that could hurt or bruise you. Be careful to  avoid falls. Wear an alert bracelet or carry a card that says you take blood thinners. Lifestyle Do not smoke or use any products that contain nicotine or tobacco. If you need help quitting, ask your doctor. Eat heart-healthy foods. Talk with your doctor about the right eating plan for you. Exercise regularly as told by your doctor. Do not drink alcohol . Lose weight if you are overweight. General instructions If you have sleep apnea, treat it as told by your doctor. Do not use diet pills unless your doctor says they are safe for you. Diet pills may make heart problems worse. Keep all follow-up visits. Your doctor will check your heart rate and rhythm regularly. Contact a doctor if: You notice a change in the speed, rhythm, or strength of your heartbeat. You are taking a blood-thinning medicine and you get more bruising. You get tired more easily when you move or exercise. You have a sudden change in weight. Get help right away if:  You have pain in your chest. You have trouble breathing. You have side effects of blood thinners, such as blood in your vomit, poop (stool), or pee (urine), or bleeding that cannot stop. You have any signs of a stroke. BE FAST is an easy way to remember the main warning signs: B - Balance. Dizziness, sudden trouble walking, or loss of balance. E - Eyes. Trouble seeing or a change in how you see. F - Face. Sudden weakness or loss of feeling in the face. The face or eyelid may droop on one side. A - Arms.Weakness or loss of feeling in an arm. This happens suddenly and usually on one side of the body. S - Speech. Sudden trouble speaking, slurred speech, or trouble understanding what people say. T - Time.Time to call emergency services. Write down what time symptoms started. You have other signs of a stroke, such as: A sudden, very bad headache with no known cause. Feeling like you may vomit (nausea). Vomiting. A seizure. These symptoms may be an emergency.  Get help right away. Call 911. Do not wait to see if the symptoms will go away. Do not drive yourself to the hospital. This information is not intended to replace advice given to you by your health care provider. Make sure you discuss any questions you have with your health care provider. Document Revised: 10/16/2021 Document Reviewed: 10/16/2021 Elsevier Patient Education  2024 [employer].   Energy Conservation Techniques  Sit for as many activities as possible. Use slow, smooth movements.  Rushing increases discomfort. Determine the necessity of performing the task.  Simplify those tasks that are necessary.  (Get clothes out of the dryer when they are warm instead of ironing,  let dishes air dry, etc.) Take frequent rests both during and between activities.  Avoid repetitive tasks. Pre-plan your activities; try a daily and/or weekly schedule.  Spread out the activities that are most fatiguing (break up cleaning tasks over multiple days). Remember to plan a balance of work, rest and recreation. Consider the best time for each activity.  Do the most exertive task when you have the most energy. Don't carry items if you can push them.  Slide, don't lift. Push, don't pull. Utilize two hands when appropriate. Maintain good posture and use proper body mechanics. Avoid remaining in one position for too long. When lifting, bend at the knees, not at the waist.  Exhale when bending down, inhale when straightening up. Carry objects as close to your body and as near to the center of the pelvis.  11. Avoid wasted body movements (position yourself for the task so that you avoid bending, twisting, etc.                when possible). 12. Select the best working environment.  Consider lighting, ventilation, clothing, and equipment. 13. Organize your storage areas, making the items you use daily convenient.  Store heaviest items at waist            height.  Store frequently used items between shoulders  and knee height.  Consider leaving frequently used       items on countertops.  (You can organize in storage baskets based on time used/purpose). 14. Feelings and emotions can be real causes of fatigue.  Try to avoid unnecessary worry, irritation, or                    frustration.  Avoid stress, it can also be a source of fatigue. 15. Get help from other people for difficult tasks. 16. Explore equipment or items that may be able to do the job for you with greater ease.  (Electric can        openers, blenders, lightweight items for cleaning, etc.)   Fall Prevention in the Home, Adult Falls can cause injuries and affect people of all ages. There are many simple things that you can do to make your home safe and to help prevent falls. If you need it, ask for help making these changes. What actions can I take to prevent falls? General information Use good lighting in all rooms. Make sure to: Replace any light bulbs that burn out. Turn on lights if it is dark and use night-lights. Keep items that you use often in easy-to-reach places. Lower the shelves around your home if needed. Move furniture so that there are clear paths around it. Do not keep throw rugs or other things on the floor that can make you trip. If any of your floors are uneven, fix them. Add color or contrast paint or tape to clearly mark and help you see: Grab bars or handrails. First and last steps of staircases. Where the edge of each step is. If you use a ladder or stepladder: Make sure that it is fully opened. Do not climb a closed ladder. Make sure the sides of the ladder are locked in place. Have someone hold the ladder while you use it. Know where your pets are as you move through your home. What can I do in the bathroom?     Keep the floor dry. Clean up any water that is on the floor right away. Remove soap buildup in the bathtub or shower.  Buildup makes bathtubs and showers slippery. Use non-skid mats or decals on  the floor of the bathtub or shower. Attach bath mats securely with double-sided, non-slip rug tape. If you need to sit down while you are in the shower, use a non-slip stool. Install grab bars by the toilet and in the bathtub and shower. Do not use towel bars as grab bars. What can I do in the bedroom? Make sure that you have a light by your bed that is easy to reach. Do not use any sheets or blankets on your bed that hang to the floor. Have a firm bench or chair with side arms that you can use for support when you get dressed. What can I do in the kitchen? Clean up any spills right away. If you need to reach something above you, use a sturdy step stool that has a grab bar. Keep electrical cables out of the way. Do not use floor polish or wax that makes floors slippery. What can I do with my stairs? Do not leave anything on the stairs. Make sure that you have a light switch at the top and the bottom of the stairs. Have them installed if you do not have them. Make sure that there are handrails on both sides of the stairs. Fix handrails that are broken or loose. Make sure that handrails are as long as the staircases. Install non-slip stair treads on all stairs in your home if they do not have carpet. Avoid having throw rugs at the top or bottom of stairs, or secure the rugs with carpet tape to prevent them from moving. Choose a carpet design that does not hide the edge of steps on the stairs. Make sure that carpet is firmly attached to the stairs. Fix any carpet that is loose or worn. What can I do on the outside of my home? Use bright outdoor lighting. Repair the edges of walkways and driveways and fix any cracks. Clear paths of anything that can make you trip, such as tools or rocks. Add color or contrast paint or tape to clearly mark and help you see high doorway thresholds. Trim any bushes or trees on the main path into your home. Check that handrails are securely fastened and in good  repair. Both sides of all steps should have handrails. Install guardrails along the edges of any raised decks or porches. Have leaves, snow, and ice cleared regularly. Use sand, salt, or ice melt on walkways during winter months if you live where there is ice and snow. In the garage, clean up any spills right away, including grease or oil spills. What other actions can I take? Review your medicines with your health care provider. Some medicines can make you confused or feel dizzy. This can increase your chance of falling. Wear closed-toe shoes that fit well and support your feet. Wear shoes that have rubber soles and low heels. Use a cane, walker, scooter, or crutches that help you move around if needed. Talk with your provider about other ways that you can decrease your risk of falls. This may include seeing a physical therapist to learn to do exercises to improve movement and strength. Where to find more information Centers for Disease Control and Prevention, [employer]AD[employer]: tonerpromos.no [employer] on Aging: baseringtones.pl National Institute on Aging: baseringtones.pl Contact a health care provider if: You are afraid of falling at home. You feel weak, drowsy, or dizzy at home. You fall at home. Get help right away if you: Lose  consciousness or have trouble moving after a fall. Have a fall that causes a head injury. These symptoms may be an emergency. Get help right away. Call 911. Do not wait to see if the symptoms will go away. Do not drive yourself to the hospital. This information is not intended to replace advice given to you by your health care provider. Make sure you discuss any questions you have with your health care provider. Document Revised: 09/30/2021 Document Reviewed: 09/30/2021 Elsevier Patient Education  2024 [employer].

## 2024-03-15 ENCOUNTER — Other Ambulatory Visit: Payer: Self-pay | Admitting: Cardiology

## 2024-03-17 ENCOUNTER — Ambulatory Visit
Admission: RE | Admit: 2024-03-17 | Discharge: 2024-03-17 | Disposition: A | Source: Ambulatory Visit | Attending: Pulmonary Disease | Admitting: Pulmonary Disease

## 2024-03-17 ENCOUNTER — Other Ambulatory Visit: Payer: Self-pay

## 2024-03-17 ENCOUNTER — Ambulatory Visit: Admitting: Pulmonary Disease

## 2024-03-17 ENCOUNTER — Encounter: Payer: Self-pay | Admitting: Pulmonary Disease

## 2024-03-17 VITALS — BP 116/64 | HR 63 | Temp 97.7°F | Ht 71.0 in | Wt 127.0 lb

## 2024-03-17 DIAGNOSIS — R918 Other nonspecific abnormal finding of lung field: Secondary | ICD-10-CM

## 2024-03-17 DIAGNOSIS — J449 Chronic obstructive pulmonary disease, unspecified: Secondary | ICD-10-CM

## 2024-03-17 DIAGNOSIS — J189 Pneumonia, unspecified organism: Secondary | ICD-10-CM

## 2024-03-17 DIAGNOSIS — Z87891 Personal history of nicotine dependence: Secondary | ICD-10-CM

## 2024-03-17 DIAGNOSIS — J4489 Other specified chronic obstructive pulmonary disease: Secondary | ICD-10-CM

## 2024-03-17 DIAGNOSIS — G4734 Idiopathic sleep related nonobstructive alveolar hypoventilation: Secondary | ICD-10-CM

## 2024-03-17 NOTE — Progress Notes (Signed)
 "  Subjective:    Patient ID: Antonio [name], male    DOB: 18-Apr-1945, 79 y.o.   MRN: [id number]  Patient Care Team: Abbey Bruckner, MD as PCP - General (Family Medicine) End, Lonni, MD as PCP - Cardiology (Cardiology) Cindie Ole DASEN, MD (Inactive) as PCP - Electrophysiology (Clinical Cardiac Electrophysiology) Pyrtle, Gordy HERO, MD as Consulting Physician (Gastroenterology) Penne Knee, MD (Inactive) as Consulting Physician (Urology) Tamea Dedra CROME, MD as Consulting Physician (Pulmonary Disease) Riddle, Suzann, NP as Nurse Practitioner (Clinical Cardiac Electrophysiology) Gerard Frederick, NP as Nurse Practitioner (Cardiology) Devra Lands, RN as [facility] Care Management  Chief Complaint  Patient presents with   COPD    Shortness of breath on exertion.     BACKGROUND/INTERVAL:  HPI Discussed the use of AI scribe software for clinical note transcription with the patient, who gave verbal consent to proceed.  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  He has a history of COPD and asthma overlap. His breathing is currently stable, allowing him to go up and down stairs without significant difficulty. He uses Ohtuvayre  for his respiratory condition and requests a new prescription.  He recently experienced an episode of pneumonia, initially detected on a chest x-ray. Follow-up imaging showed improvement, with only a small residual area remaining. He has a history of scarring on the right side of his lung, attributed to his COPD.  No recent issues with reflux or heartburn, which were considered as potential contributing factors to his pneumonia.   He is on Dupixent  for asthma component associated with his COPD.  He is on nocturnal oxygen  for nocturnal hypoxemia and is compliant with the therapy and notes benefit of the therapy.   DATA 06/09/2014  pulmonary function test> ratio 51%, FEV1 1.47 L (40% predicted), total lung capacity 5.43 L (71% predicted). DLCO 16.26 (44% predicted). December 2017 lung function testing showed ratio 50%, FEV1 1.47 L 43% predicted, FVC 2.96 L 67% predicted. March 2020 pulmonary function testing ratio 38%, FEV1 0.97 L 31% predicted, total lung capacity 8.44 L 119% predicted, residual volume 198% predicted, DLCO 14.0 55% predicted. March 2020 high-resolution CT scan of the chest images : Centrilobular emphysema, no interstitial lung disease, aortic atherosclerosis. 04/20/2018-eosinophils relative 7.3, eosinophils absolute 0.6 04/20/2018-IgE-67 01/20/2019-SARS-CoV-2-detected. 03/06/2021 alpha 1 antitrypsin: Phenotype MM, level 165. 03/11/2021 echocardiogram: LVEF 55%, right ventricular function normal.  No wall motion abnormalities. 04/03/2021 PFT: FEV1 1.09 L or 36% predicted, FVC 2.60 L or 61% predicted, FEV1/FVC 42%, no bronchodilator response. Lung volumes showed moderate air trapping.  Diffusion capacity severely decreased at 49%. 05/22/2022 chest x-ray 1 view: Patchy airspace disease in the medial right base concerning for pneumonia. 11/12/2022 chest x-ray PA and lateral: No overt infiltrate noted, changes of severe COPD noted. 01/07/2023 chest x-ray PA and lateral: Emphysema without evidence of acute cardiopulmonary disease. 01/14/2024 chest x-ray PA and lateral: Right middle lobe pneumonia.    Review of Systems A 10 point review of systems was performed and it is as noted above otherwise negative.   Patient Active Problem List   Diagnosis Date Noted   Decubitus ulcer of right buttock, stage 2 (HCC) 02/15/2024   Raynaud's phenomenon without gangrene 09/16/2023   Lumbar back pain 09/16/2023   Sensation of cold in finger 09/16/2023   Hyperlipidemia 03/02/2023   Hemorrhoids 03/02/2023   Weight loss 03/02/2023   Pure hypercholesterolemia 05/24/2022  Primary hypertension 05/24/2022   Atrial  fibrillation with rapid ventricular response (HCC) 05/22/2022   Nocturnal hypoxemia 05/22/2022   Coronary artery disease involving native coronary artery of native heart without angina pectoris 03/21/2022   DOE (dyspnea on exertion) 01/24/2021   Secondary hypercoagulable state 12/19/2020   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   PAD ss/p SFA stent(peripheral artery disease) 11/08/2019   Paroxysmal atrial fibrillation (HCC) 09/02/2019   History of COVID-19 01/20/2019   Severe persistent asthma without complication (HCC) 01/20/2019   Stage 3 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 (HCC) 09/20/2018   Increased eosinophils in the blood 06/21/2018   Diffusion capacity of lung (dl), decreased 98/69/7979   COPD with acute exacerbation (HCC) 06/22/2017   Atrial flutter, paroxysmal (HCC) 10/17/2016   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/2016   SVT (supraventricular tachycardia) 10/17/2016   Ulcerative colitis (HCC) 05/28/2016   Carotid stenosis 05/02/2016   Atherosclerosis of native arteries of extremity with intermittent claudication 05/02/2016   Bilateral carpal tunnel syndrome 12/06/2015   Centrilobular Emphysema 04/25/2014   Former smoker 04/25/2014    Social History   Tobacco Use   Smoking status: Former    Current packs/day: 0.00    Average packs/day: 0.5 packs/day for 35.0 years (17.5 ttl pk-yrs)    Types: Cigarettes, Cigars    Start date: 06/10/1976    Quit date: 06/11/2011    Years since quitting: 12.7   Smokeless tobacco: Never   Tobacco comments:    Former smoker 12/19/2020  Substance Use Topics   Alcohol  use: Yes    Alcohol /week: 7.0 standard drinks of alcohol     Types: 7 Shots of liquor per week    Comment: liquor (eg- martini, scotch every day)    Allergies[1]  Active Medications[2]  Immunization History  Administered Date(s) Administered   Fluad Quad(high Dose 65+) 11/01/2020, 10/29/2021   Fluad Trivalent(High Dose 65+) 10/16/2022   INFLUENZA, HIGH DOSE SEASONAL PF 11/05/2016, 10/29/2017,  10/06/2018, 11/03/2019, 10/28/2023   Influenza Inj Mdck Quad Pf 10/28/2017   Influenza Split 11/24/2013   Influenza,inj,Quad PF,6+ Mos 11/08/2014, 11/26/2015   Influenza-Unspecified 10/11/2013, 10/11/2013   PFIZER(Purple Top)SARS-COV-2 Vaccination 05/06/2019, 05/31/2019, 01/27/2020   PNEUMOCOCCAL CONJUGATE-20 06/03/2022   Pneumococcal Conjugate-13 04/18/2014   Pneumococcal Polysaccharide-23 11/26/2015   Respiratory Syncytial Virus Vaccine,Recomb Aduvanted(Arexvy) 10/14/2022   Tdap 02/13/2011   Zoster Recombinant(Shingrix) 11/05/2016, 12/01/2016, 03/02/2017   Zoster, Live 11/07/2010        Objective:     Vitals:   03/17/24 0856  BP: 116/64  Pulse: 63  Temp: 97.7 F (36.5 C)  Height: 5' 11 (1.803 m)  Weight: 127 lb (57.6 kg)  SpO2: 95%  TempSrc: Temporal  BMI (Calculated): 17.72     SpO2: 95 %  GENERAL: Thin well-developed gentleman, no acute distress.  Well-groomed, fully ambulatory without assistance.  No conversational dyspnea. HEAD: Normocephalic, atraumatic.  EYES: Pupils equal, round, reactive to light.  No scleral icterus.  MOUTH: Dentition intact.  Oral mucosa moist.  No thrush. NECK: Supple. No thyromegaly. Trachea midline. No JVD.  No adenopathy. PULMONARY: Distant breath sounds bilaterally.  Coarse breath sounds, no wheezes or rhonchi noted.  No crackles. CARDIOVASCULAR: S1 and S2.  Regular rate and rhythm.  No rubs, murmurs or gallops heard. ABDOMEN: Benign. MUSCULOSKELETAL: No joint deformity, no clubbing, no edema.   NEUROLOGIC: No overt focal deficit, no gait disturbance, speech is fluent. SKIN: Intact,warm,dry. PSYCH: Mood and behavior normal.   Chest x-ray performed today shows improvement on the right middle lobe process however with persistent opacity (arrows):  Assessment & Plan:     ICD-10-CM   1. Opacity of lung on imaging study  R91.8     2. Stage 3 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 (HCC)  J44.9     3. Asthma-COPD overlap  syndrome (HCC)  J44.89     4. Community acquired pneumonia of right middle lobe of lung  J18.9     5. Nocturnal hypoxemia  G47.34       Orders Placed This Encounter  Procedures   CT CHEST WO CONTRAST    Standing Status:   Future    Expected Date:   03/24/2024    Expiration Date:   03/17/2025    Preferred imaging location?:   [facility] [place]   Discussion:    Pneumonia, slow to resolve Pneumonia is improving with residual right-sided blush on imaging. No recent reflux or heartburn symptoms. Potential aspiration discussed as a cause. Increased infection susceptibility due to COPD and asthma. 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 Increased infection susceptibility due to underlying lung conditions. - Continue Ohtuvayre  - Continue Dupixent  - Advised masking in crowded places to prevent infections     Follow-up in 4 to 6 weeks.  Advised if symptoms do not improve or worsen, to please contact office for sooner follow up or seek emergency care.    I spent 32 minutes of dedicated to the care of this patient on the date of this encounter to include pre-visit review of records, face-to-face time with the patient discussing conditions above, post visit ordering of testing, clinical documentation with the electronic health record, making appropriate referrals as documented, and communicating necessary findings to members of the patients care team.     C. Leita Sanders, MD Advanced Bronch[facility]    *This note was generated using voice recognition software/Dragon and/or AI transcription program.  Despite best efforts to proofread, errors can occur which can change the meaning. Any transcriptional errors that result from this process are unintentional and may not be fully corrected at the  time of dictation.     [1]  Allergies Allergen Reactions   Glycopyrrolate  Other (See Comments)    Urinary retention   Tiotropium Bromide  Other (See Comments)    Urinary retention   Penicillins Rash    Has patient had a PCN reaction causing immediate rash, facial/tongue/throat swelling, SOB or lightheadedness with hypotension:Yes Has patient had a PCN reaction causing severe rash involving mucus membranes or skin necrosis:Yes Has patient had a PCN reaction that required hospitalization:No Has patient had a PCN reaction occurring within the last 10 years:No If all of the above answers are NO, then may proceed with Cephalosporin use.   [2]  Current Meds  Medication Sig   albuterol  (PROVENTIL ) (2.5 MG/3ML) 0.083% nebulizer solution Take 3 mLs (2.5 mg total) by nebulization every 6 (six) hours as needed for wheezing or shortness of breath. (Patient taking differently: Take 2.5 mg by nebulization every 6 (six) hours as needed for wheezing or shortness of breath. Patient reports taking 2x day regularly)   albuterol  (VENTOLIN  HFA) 108 (90 Base) MCG/ACT inhaler Inhale 2 puffs into the lungs every 6 (six) hours as needed. (Patient taking differently: Inhale 2 puffs into the lungs every 6 (six) hours as needed for shortness of breath. Patient reports taking 2-3x day regularly)   Creatine POWD by Does not apply  route daily.   diltiazem  (CARDIZEM  CD) 120 MG 24 hr capsule TAKE 1 CAPSULE BY MOUTH EVERY DAY   dupilumab  (DUPIXENT ) 300 MG/2ML prefilled syringe Inject 300 mg into the skin every 14 (fourteen) days.   flecainide  (TAMBOCOR ) 50 MG tablet TAKE 1 TABLET BY MOUTH TWICE A DAY   fluticasone  furoate-vilanterol (BREO ELLIPTA ) 200-25 MCG/ACT AEPB Inhale 1 puff into the lungs daily.   furosemide  (LASIX ) 20 MG tablet TAKE 1 TABLET BY MOUTH DAILY AS NEEDED (FOR SWELLING OR WEIGHT GAIN OF 3 LBS OVERNIGHT OR 5 LBS IN A WEEK). (Patient taking differently: Take 20 mg by mouth as needed for fluid.)    hydrocortisone  (ANUSOL -HC) 25 MG suppository Place 1 suppository (25 mg total) rectally at bedtime.   ipratropium (ATROVENT ) 0.03 % nasal spray Place 2 sprays into both nostrils 3 (three) times daily.   mesalamine  (LIALDA ) 1.2 g EC tablet TAKE 4 TABLETS BY MOUTH DAILY WITH BREAKFAST   metoprolol  tartrate (LOPRESSOR ) 25 MG tablet Take 25 mg by mouth as directed.   Nebulizers (PARI VIOS PRO LC PLUS SYSTEM) MISC Please use as directed.   nitroGLYCERIN  (NITROSTAT ) 0.4 MG SL tablet Place 1 tablet (0.4 mg total) under the tongue every 5 (five) minutes as needed for chest pain.   OHTUVAYRE  3 MG/2.5ML SUSP Inhale 3 mg into the lungs in the morning and at bedtime.   rivaroxaban  (XARELTO ) 20 MG TABS tablet Take 1 tablet (20 mg total) by mouth daily with supper. NEEDS UPDATED LABS AT PCP VISIT FOR XARELTO  REFILLS.  THANK YOU   rosuvastatin  (CRESTOR ) 10 MG tablet TAKE 1 TABLET BY MOUTH DAILY   "

## 2024-03-17 NOTE — Patient Instructions (Signed)
 VISIT SUMMARY:  During your visit, we discussed your recent episode of pneumonia and your ongoing management of COPD with asthma overlap. Your breathing is currently stable, and you are able to go up and down stairs without significant difficulty. We also addressed your medication needs and preventive measures to avoid future infections.  YOUR PLAN:  -PNEUMONIA: Pneumonia is an infection that inflames the air sacs in one or both lungs. Your pneumonia is improving, but there is still a small residual area on your imaging. We discussed the possibility of aspiration as a cause and noted your increased susceptibility to infections due to COPD and asthma. A CT chest scan has been ordered to further evaluate the slow-to-resolve pneumonia. The scan is scheduled at the center in [place].  -COPD WITH ASTHMA OVERLAP: COPD with asthma overlap is a condition where symptoms of both chronic obstructive pulmonary disease and asthma are present. Your condition is well-managed, and you are not experiencing significant shortness of breath during physical activity. You have been prescribed Ohtuvayre  for your respiratory condition. Additionally, you are advised to wear a mask in crowded places to prevent infections.  INSTRUCTIONS:  Please follow up with the CT chest scan at the center in [facility] as scheduled. Continue taking Ohtuvayre  as prescribed and wear a mask in crowded places to reduce the risk of infections.

## 2024-03-18 MED ORDER — OHTUVAYRE 3 MG/2.5ML IN SUSP: 3.0000 mg | Freq: Two times a day (BID) | RESPIRATORY_TRACT | 5 refills | Status: AC

## 2024-03-18 NOTE — Telephone Encounter (Signed)
 Refill sent for OHTUVAYRE  to [facility]: [phone]  Dose: 3mg  via nebulizer twice daily   Last OV: 03/18/24 Provider: Dr. Tamea  Next OV: 04/28/24  Aleck Puls, PharmD, BCPS Clinical Pharmacist  [facility]

## 2024-03-23 ENCOUNTER — Ambulatory Visit

## 2024-03-24 ENCOUNTER — Telehealth

## 2024-03-31 ENCOUNTER — Encounter: Admitting: Physician Assistant

## 2024-04-12 ENCOUNTER — Encounter (INDEPENDENT_AMBULATORY_CARE_PROVIDER_SITE_OTHER)

## 2024-04-12 ENCOUNTER — Ambulatory Visit (INDEPENDENT_AMBULATORY_CARE_PROVIDER_SITE_OTHER): Admitting: Vascular Surgery

## 2024-04-14 ENCOUNTER — Ambulatory Visit

## 2024-04-26 ENCOUNTER — Ambulatory Visit: Admitting: Physician Assistant

## 2024-04-28 ENCOUNTER — Ambulatory Visit: Admitting: Pulmonary Disease

## 2024-05-03 ENCOUNTER — Encounter: Admitting: Internal Medicine

## 2024-08-23 ENCOUNTER — Ambulatory Visit
